# Patient Record
Sex: Female | Born: 1970 | Race: White | Hispanic: No | Marital: Married | Smoking: Never smoker
Health system: Southern US, Community
[De-identification: ages and names within clinical notes are randomized; demographics above are authoritative.]

## PROBLEM LIST (undated history)

## (undated) DIAGNOSIS — E785 Hyperlipidemia, unspecified: Secondary | ICD-10-CM

## (undated) DIAGNOSIS — F259 Schizoaffective disorder, unspecified: Secondary | ICD-10-CM

## (undated) DIAGNOSIS — F329 Major depressive disorder, single episode, unspecified: Secondary | ICD-10-CM

## (undated) DIAGNOSIS — G4730 Sleep apnea, unspecified: Secondary | ICD-10-CM

## (undated) DIAGNOSIS — F32A Depression, unspecified: Secondary | ICD-10-CM

## (undated) DIAGNOSIS — I10 Essential (primary) hypertension: Secondary | ICD-10-CM

## (undated) DIAGNOSIS — F22 Delusional disorders: Secondary | ICD-10-CM

## (undated) DIAGNOSIS — F429 Obsessive-compulsive disorder, unspecified: Secondary | ICD-10-CM

## (undated) DIAGNOSIS — F319 Bipolar disorder, unspecified: Secondary | ICD-10-CM

## (undated) DIAGNOSIS — F419 Anxiety disorder, unspecified: Secondary | ICD-10-CM

## (undated) DIAGNOSIS — E119 Type 2 diabetes mellitus without complications: Secondary | ICD-10-CM

## (undated) DIAGNOSIS — J309 Allergic rhinitis, unspecified: Secondary | ICD-10-CM

## (undated) DIAGNOSIS — K219 Gastro-esophageal reflux disease without esophagitis: Secondary | ICD-10-CM

## (undated) DIAGNOSIS — M1990 Unspecified osteoarthritis, unspecified site: Secondary | ICD-10-CM

## (undated) DIAGNOSIS — R002 Palpitations: Secondary | ICD-10-CM

## (undated) DIAGNOSIS — J45909 Unspecified asthma, uncomplicated: Secondary | ICD-10-CM

## (undated) HISTORY — DX: Anxiety disorder, unspecified: F41.9

## (undated) HISTORY — DX: Essential (primary) hypertension: I10

## (undated) HISTORY — DX: Sleep apnea, unspecified: G47.30

## (undated) HISTORY — DX: Gastro-esophageal reflux disease without esophagitis: K21.9

## (undated) HISTORY — DX: Obsessive-compulsive disorder, unspecified: F42.9

## (undated) HISTORY — DX: Unspecified asthma, uncomplicated: J45.909

## (undated) HISTORY — DX: Palpitations: R00.2

## (undated) HISTORY — DX: Delusional disorders: F22

## (undated) HISTORY — PX: TUBAL LIGATION: SHX77

## (undated) HISTORY — DX: Hyperlipidemia, unspecified: E78.5

## (undated) HISTORY — DX: Depression, unspecified: F32.A

## (undated) HISTORY — DX: Schizoaffective disorder, unspecified: F25.9

## (undated) HISTORY — DX: Type 2 diabetes mellitus without complications: E11.9

## (undated) HISTORY — DX: Allergic rhinitis, unspecified: J30.9

## (undated) HISTORY — DX: Major depressive disorder, single episode, unspecified: F32.9

## (undated) HISTORY — DX: Bipolar disorder, unspecified: F31.9

## (undated) HISTORY — DX: Unspecified osteoarthritis, unspecified site: M19.90

---

## 2004-03-08 ENCOUNTER — Emergency Department (HOSPITAL_COMMUNITY): Admission: EM | Admit: 2004-03-08 | Discharge: 2004-03-08 | Payer: Self-pay | Admitting: Emergency Medicine

## 2004-03-21 ENCOUNTER — Emergency Department: Payer: Self-pay | Admitting: Emergency Medicine

## 2004-04-27 HISTORY — PX: CHOLECYSTECTOMY: SHX55

## 2004-09-01 ENCOUNTER — Ambulatory Visit: Payer: Self-pay | Admitting: Obstetrics and Gynecology

## 2005-04-09 ENCOUNTER — Inpatient Hospital Stay: Payer: Self-pay | Admitting: Obstetrics and Gynecology

## 2006-10-07 ENCOUNTER — Encounter: Payer: Self-pay | Admitting: Maternal & Fetal Medicine

## 2006-10-21 ENCOUNTER — Encounter: Payer: Self-pay | Admitting: Maternal & Fetal Medicine

## 2007-02-08 ENCOUNTER — Observation Stay: Payer: Self-pay | Admitting: Obstetrics and Gynecology

## 2007-02-24 ENCOUNTER — Ambulatory Visit: Payer: Self-pay | Admitting: Obstetrics and Gynecology

## 2007-02-25 ENCOUNTER — Inpatient Hospital Stay: Payer: Self-pay | Admitting: Obstetrics and Gynecology

## 2007-03-16 ENCOUNTER — Other Ambulatory Visit: Payer: Self-pay

## 2007-03-16 ENCOUNTER — Emergency Department: Payer: Self-pay | Admitting: Emergency Medicine

## 2009-07-22 ENCOUNTER — Ambulatory Visit: Payer: Self-pay | Admitting: Family Medicine

## 2009-08-01 ENCOUNTER — Ambulatory Visit: Payer: Self-pay | Admitting: Family Medicine

## 2009-08-12 ENCOUNTER — Ambulatory Visit: Payer: Self-pay | Admitting: Family Medicine

## 2009-08-15 ENCOUNTER — Ambulatory Visit: Payer: Self-pay | Admitting: Family Medicine

## 2009-08-26 ENCOUNTER — Ambulatory Visit: Payer: Self-pay | Admitting: Family Medicine

## 2009-08-29 ENCOUNTER — Ambulatory Visit: Payer: Self-pay | Admitting: Family Medicine

## 2009-09-25 ENCOUNTER — Ambulatory Visit: Payer: Self-pay | Admitting: Family Medicine

## 2010-05-01 DIAGNOSIS — G4730 Sleep apnea, unspecified: Secondary | ICD-10-CM | POA: Insufficient documentation

## 2010-07-28 ENCOUNTER — Ambulatory Visit: Payer: Self-pay | Admitting: Family Medicine

## 2011-01-16 ENCOUNTER — Ambulatory Visit: Payer: Self-pay | Admitting: Gastroenterology

## 2011-04-30 ENCOUNTER — Ambulatory Visit: Payer: Self-pay | Admitting: Family Medicine

## 2011-11-23 IMAGING — US ABDOMEN ULTRASOUND
1 series · 17 of 25 positions shown · non-contrast
Comparison: none

REASON FOR EXAM: ELEVATED LIVER ENZYMES
COMMENTS:

[Series 1: abdomen ultrasound · 17 of 59 slices shown]
[im 1/59]
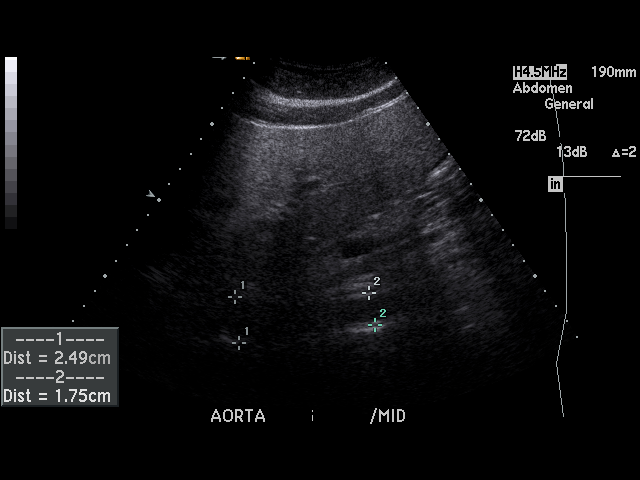
[im 5/59]
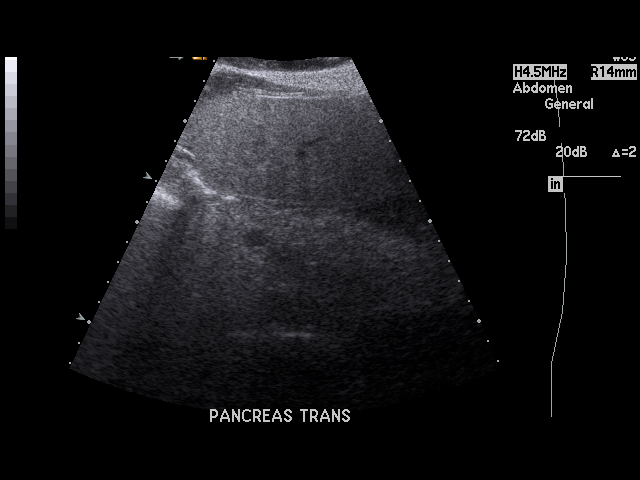
[im 8/59]
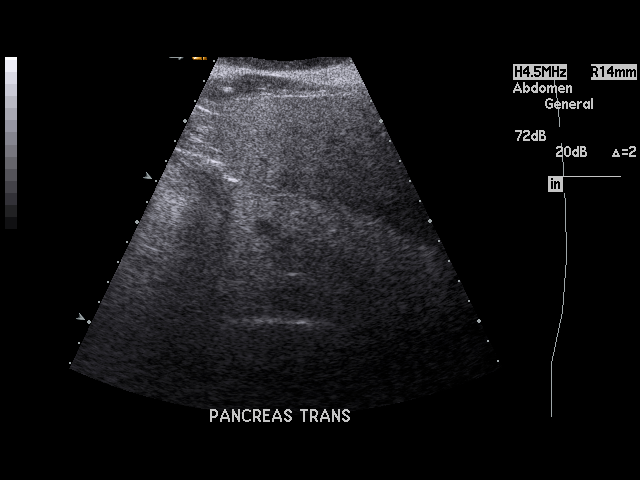
[im 13/59]
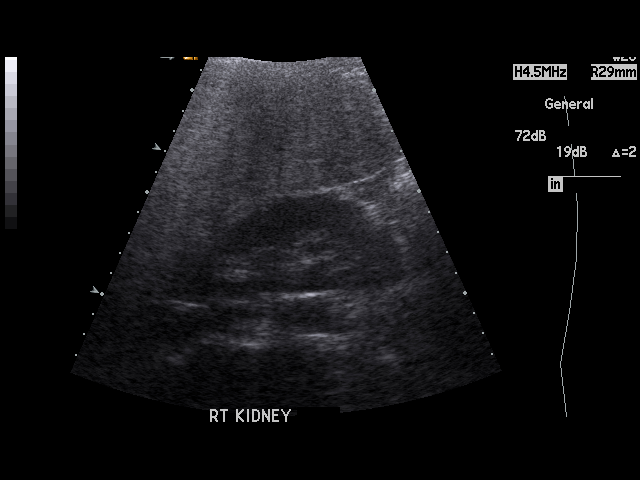
[im 15/59]
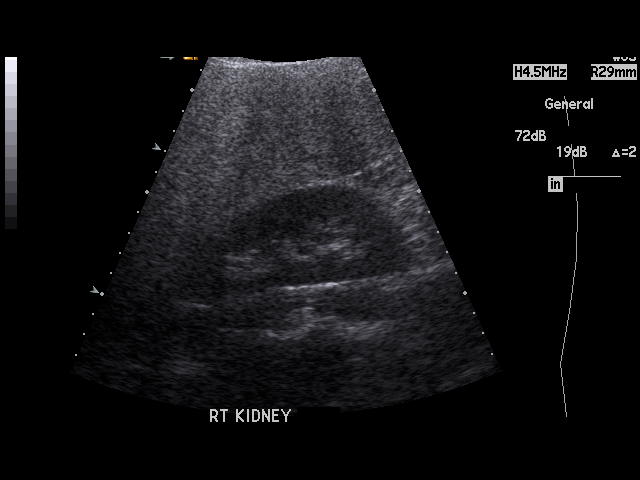
[im 20/59]
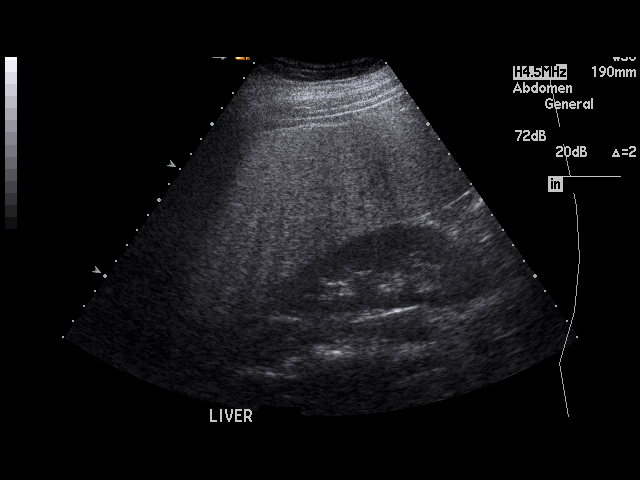
[im 22/59]
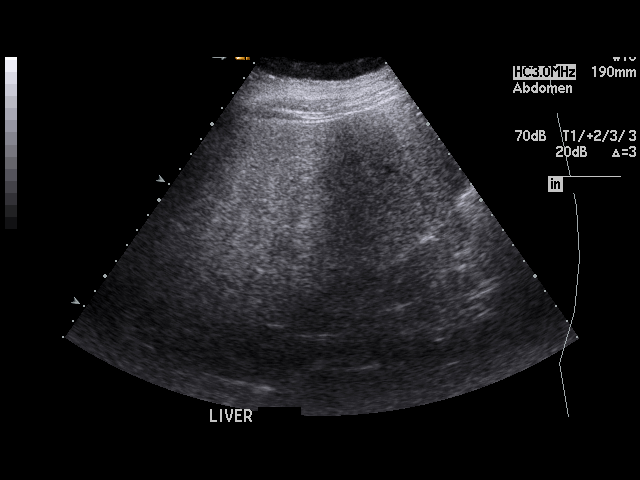
[im 27/59]
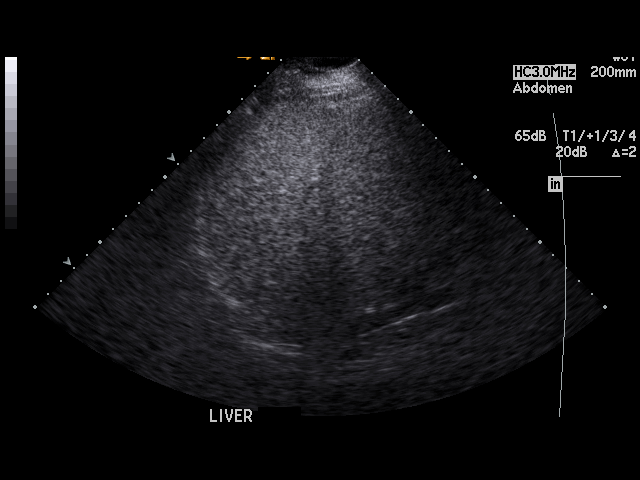
[im 30/59]
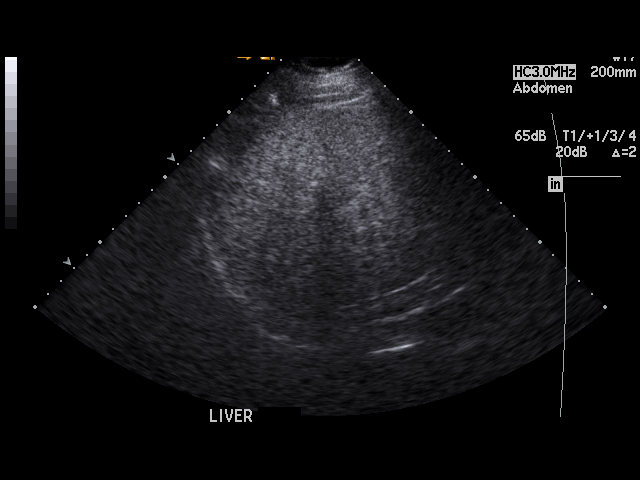
[im 32/59]
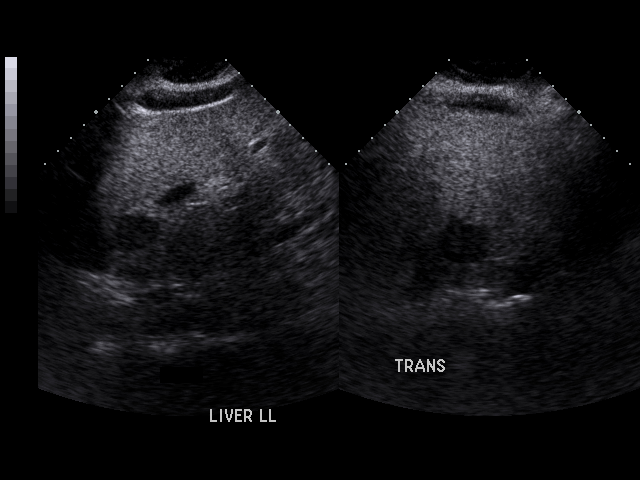
[im 37/59]
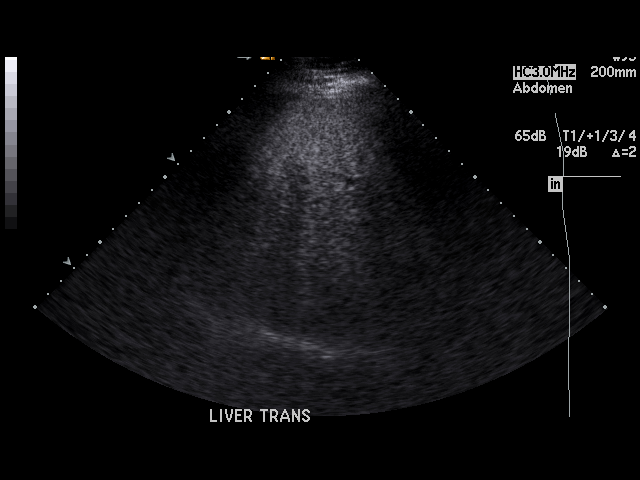
[im 39/59]
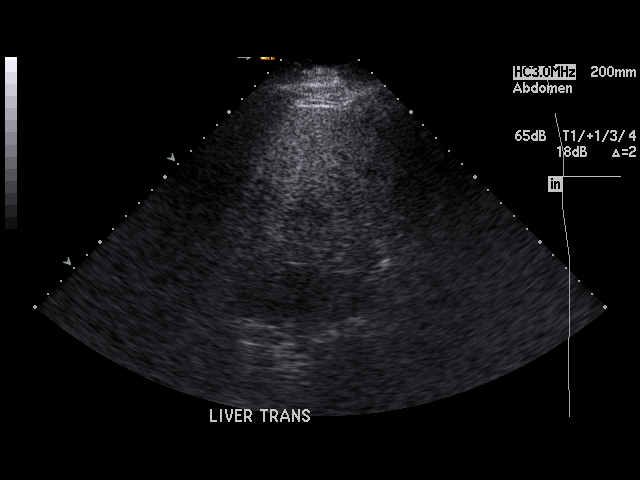
[im 44/59]
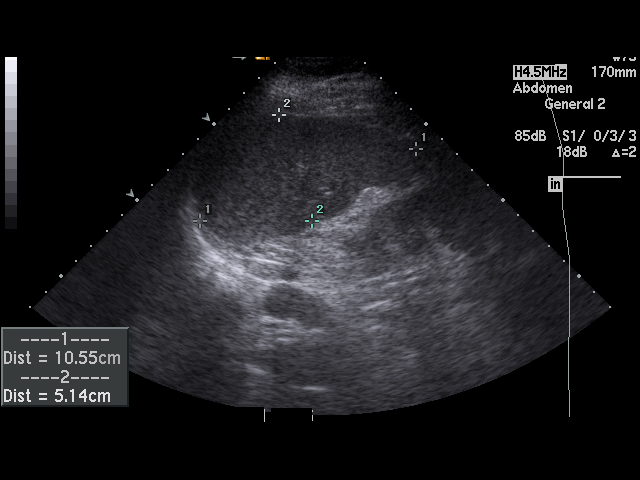
[im 46/59]
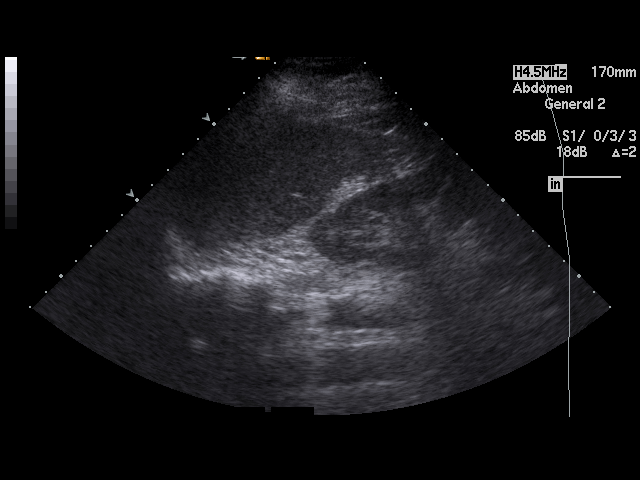
[im 51/59]
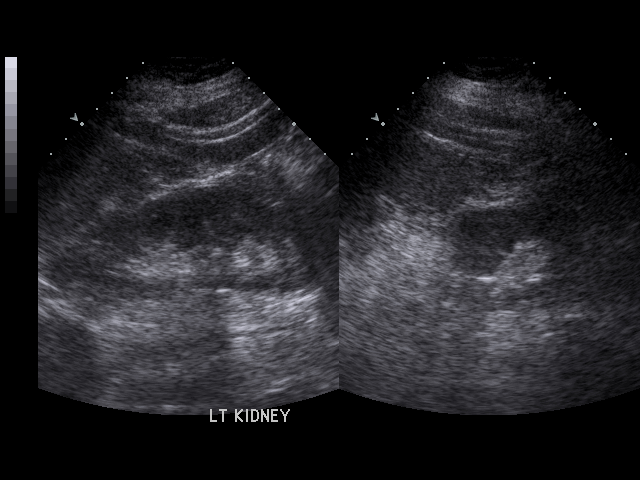
[im 54/59]
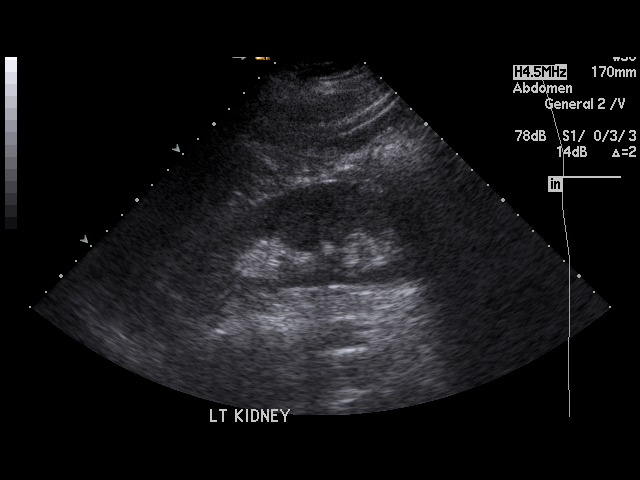
[im 59/59]
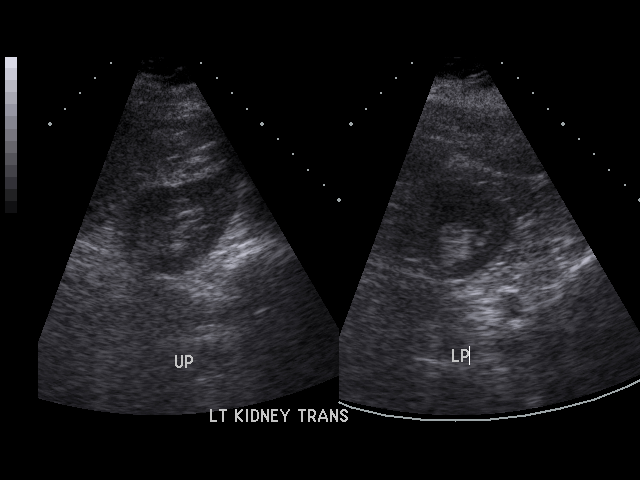

[17 of 25 positions shown; findings below may reference images not displayed]

PROCEDURE:     US  - US ABDOMEN GENERAL SURVEY  - August 01, 2009  [DATE]

RESULT:     The hepatic echo pattern is dense, suspicious for diffuse
hepatic parenchymal disease such as fatty infiltration. There is a 2.89 cm
hypoechoic mass posteriorly in the left lobe of the liver. This could
represent a cyst containing debris but the possibility of a solid hypoechoic
mass cannot be excluded sonographically and further evaluation by CT is
recommended. The head and tail of the pancreas are not optimally visualized.
The body of the pancreas is normal in appearance. The abdominal aorta and
inferior vena cava show no significant abnormalities. Spleen size is normal.
The gallbladder is not seen compatible with prior cholecystectomy. The
common bile duct measures 4.4 mm in diameter which is within normal limits.
The kidneys show no hydronephrosis. There is no ascites.
IMPRESSION: 1. The hepatic echo pattern is hyperechogenic, suspicious for fatty
infiltration.
2. There is a focal hypoechoic mass in the left lobe of the liver.
Sonographically, the mass is indeterminate and further evaluation by CT is
suggested.
3. The patient is status post cholecystectomy.
4. No ascites is seen.

## 2012-01-01 ENCOUNTER — Ambulatory Visit: Payer: Self-pay | Admitting: Family Medicine

## 2012-12-05 ENCOUNTER — Other Ambulatory Visit: Payer: Self-pay | Admitting: Internal Medicine

## 2012-12-05 DIAGNOSIS — R1110 Vomiting, unspecified: Secondary | ICD-10-CM

## 2012-12-13 ENCOUNTER — Ambulatory Visit
Admission: RE | Admit: 2012-12-13 | Discharge: 2012-12-13 | Disposition: A | Discharge status: Home / Self Care | Payer: Medicare Other | Source: Ambulatory Visit | Attending: Internal Medicine | Admitting: Internal Medicine

## 2012-12-13 DIAGNOSIS — R1110 Vomiting, unspecified: Secondary | ICD-10-CM

## 2013-03-20 ENCOUNTER — Ambulatory Visit: Payer: Self-pay | Admitting: Gastroenterology

## 2014-03-27 LAB — HM DIABETES EYE EXAM

## 2014-05-10 DIAGNOSIS — L718 Other rosacea: Secondary | ICD-10-CM | POA: Diagnosis not present

## 2014-06-15 DIAGNOSIS — J4520 Mild intermittent asthma, uncomplicated: Secondary | ICD-10-CM | POA: Diagnosis not present

## 2014-06-15 DIAGNOSIS — E782 Mixed hyperlipidemia: Secondary | ICD-10-CM | POA: Diagnosis not present

## 2014-06-15 DIAGNOSIS — K219 Gastro-esophageal reflux disease without esophagitis: Secondary | ICD-10-CM | POA: Diagnosis not present

## 2014-06-15 DIAGNOSIS — G4730 Sleep apnea, unspecified: Secondary | ICD-10-CM | POA: Diagnosis not present

## 2014-06-15 DIAGNOSIS — E119 Type 2 diabetes mellitus without complications: Secondary | ICD-10-CM | POA: Diagnosis not present

## 2014-08-03 DIAGNOSIS — E119 Type 2 diabetes mellitus without complications: Secondary | ICD-10-CM | POA: Diagnosis not present

## 2014-08-03 DIAGNOSIS — K219 Gastro-esophageal reflux disease without esophagitis: Secondary | ICD-10-CM | POA: Diagnosis not present

## 2014-08-03 DIAGNOSIS — G4730 Sleep apnea, unspecified: Secondary | ICD-10-CM | POA: Diagnosis not present

## 2014-08-03 DIAGNOSIS — Z1389 Encounter for screening for other disorder: Secondary | ICD-10-CM | POA: Diagnosis not present

## 2014-08-03 DIAGNOSIS — J4520 Mild intermittent asthma, uncomplicated: Secondary | ICD-10-CM | POA: Diagnosis not present

## 2014-10-18 DIAGNOSIS — L718 Other rosacea: Secondary | ICD-10-CM | POA: Diagnosis not present

## 2014-10-30 DIAGNOSIS — G4733 Obstructive sleep apnea (adult) (pediatric): Secondary | ICD-10-CM | POA: Diagnosis not present

## 2014-12-03 ENCOUNTER — Ambulatory Visit (INDEPENDENT_AMBULATORY_CARE_PROVIDER_SITE_OTHER): Payer: Medicare Other | Admitting: Family Medicine

## 2014-12-03 ENCOUNTER — Encounter: Payer: Self-pay | Admitting: Family Medicine

## 2014-12-03 VITALS — BP 110/74 | HR 96 | Temp 98.2°F | Resp 16 | Ht 62.5 in | Wt 223.8 lb

## 2014-12-03 DIAGNOSIS — E1140 Type 2 diabetes mellitus with diabetic neuropathy, unspecified: Secondary | ICD-10-CM | POA: Insufficient documentation

## 2014-12-03 DIAGNOSIS — Z794 Long term (current) use of insulin: Secondary | ICD-10-CM

## 2014-12-03 DIAGNOSIS — K219 Gastro-esophageal reflux disease without esophagitis: Secondary | ICD-10-CM | POA: Diagnosis not present

## 2014-12-03 DIAGNOSIS — F3175 Bipolar disorder, in partial remission, most recent episode depressed: Secondary | ICD-10-CM | POA: Diagnosis not present

## 2014-12-03 DIAGNOSIS — E119 Type 2 diabetes mellitus without complications: Secondary | ICD-10-CM | POA: Insufficient documentation

## 2014-12-03 DIAGNOSIS — F3170 Bipolar disorder, currently in remission, most recent episode unspecified: Secondary | ICD-10-CM | POA: Insufficient documentation

## 2014-12-03 DIAGNOSIS — E785 Hyperlipidemia, unspecified: Secondary | ICD-10-CM | POA: Insufficient documentation

## 2014-12-03 DIAGNOSIS — E1165 Type 2 diabetes mellitus with hyperglycemia: Secondary | ICD-10-CM | POA: Insufficient documentation

## 2014-12-03 DIAGNOSIS — I10 Essential (primary) hypertension: Secondary | ICD-10-CM

## 2014-12-03 DIAGNOSIS — E1159 Type 2 diabetes mellitus with other circulatory complications: Secondary | ICD-10-CM | POA: Insufficient documentation

## 2014-12-03 LAB — POCT GLYCOSYLATED HEMOGLOBIN (HGB A1C): Hemoglobin A1C: 7.7

## 2014-12-03 MED ORDER — INSULIN DETEMIR 100 UNIT/ML FLEXPEN: 28.0000 [IU] | PEN_INJECTOR | Freq: Two times a day (BID) | SUBCUTANEOUS | Status: DC

## 2014-12-03 MED ORDER — PIOGLITAZONE HCL 45 MG PO TABS: 45.0000 mg | ORAL_TABLET | Freq: Every day | ORAL | Status: DC

## 2014-12-03 NOTE — Patient Instructions (Addendum)
Except for change in Actos dose and Levemir dose, cont. All other current meds.  Discussed weight loss.

## 2014-12-03 NOTE — Progress Notes (Signed)
Name: Tabitha [name]   MRN: [id number]    DOB: 1971/03/21   Date:12/03/2014       Progress Note  Subjective  Chief Complaint  Chief Complaint  Patient presents with  . Diabetes    BS highest 237 lowest 190 and avarage 200    HPI  Here for f/u of DM.  Also with HBP, elevated lipids, bipolar disorder, hyperlipidemia..  Feeling ok.  Bipolar disorder in partial remission.  Weight still increaseing  No problem-specific assessment & plan notes found for this encounter.   History reviewed. No pertinent past medical history.  History  Substance Use Topics  . Smoking status: Never Smoker   . Smokeless tobacco: Not on file  . Alcohol Use: No     Current outpatient prescriptions:  .  ACCU-CHEK AVIVA PLUS test strip, , Disp: , Rfl: 1 .  atorvastatin (LIPITOR) 20 MG tablet, , Disp: , Rfl: 1 .  doxycycline (VIBRAMYCIN) 50 MG capsule, , Disp: , Rfl: 0 .  LEVEMIR FLEXTOUCH 100 UNIT/ML Pen, Inject 30 Units into the skin 2 (two) times daily., Disp: , Rfl: 0 .  lisinopril (PRINIVIL,ZESTRIL) 5 MG tablet, , Disp: , Rfl: 1 .  metformin (FORTAMET) 1000 MG (OSM) 24 hr tablet, , Disp: , Rfl: 1 .  OLANZapine (ZYPREXA) 15 MG tablet, , Disp: , Rfl: 0 .  omeprazole (PRILOSEC) 10 MG capsule, , Disp: , Rfl: 1 .  VIIBRYD 40 MG TABS, Take 40 mg by mouth every morning., Disp: , Rfl: 0  No Known Allergies  Review of Systems  Constitutional: Negative for fever, chills, weight loss and malaise/fatigue.  HENT: Negative for hearing loss.   Eyes: Negative for blurred vision and double vision.  Respiratory: Negative for cough, sputum production, shortness of breath and wheezing.   Cardiovascular: Negative for chest pain, palpitations and leg swelling.  Gastrointestinal: Negative for heartburn, nausea, vomiting, abdominal pain, diarrhea and blood in stool.  Genitourinary: Negative for dysuria, urgency and frequency.  Musculoskeletal: Negative for myalgias and joint pain.  Skin: Negative for rash.   Neurological: Negative for dizziness, tingling, sensory change, focal weakness, weakness and headaches.  Psychiatric/Behavioral: Positive for depression. Negative for suicidal ideas and substance abuse. The patient is not nervous/anxious.       Objective  Filed Vitals:   12/03/14 1356  BP: 110/74  Pulse: 96  Temp: 98.2 F (36.8 C)  TempSrc: Oral  Resp: 16  Height: 5' 2.5" (1.588 m)  Weight: 223 lb 12.8 oz (101.515 kg)     Physical Exam  Constitutional: She is well-developed, well-nourished, and in no distress. No distress.  HENT:  Head: Normocephalic and atraumatic.  Eyes: Conjunctivae and EOM are normal. Pupils are equal, round, and reactive to light. No scleral icterus.  Neck: Normal range of motion. Neck supple. No thyromegaly present.  Cardiovascular: Normal rate, regular rhythm, normal heart sounds and intact distal pulses.  Exam reveals no gallop and no friction rub.   No murmur heard. Pulmonary/Chest: Effort normal and breath sounds normal. No respiratory distress. She has no wheezes. She has no rales.  Abdominal: Soft. Bowel sounds are normal. She exhibits no distension and no mass. There is no tenderness.  Musculoskeletal: Normal range of motion. She exhibits edema (trace edema bilaterally).  Lymphadenopathy:    She has no cervical adenopathy.  Skin: Skin is warm and dry.  Psychiatric: Mood, memory, affect and judgment normal.  Vitals reviewed.     Recent Results (from the past 2160 hour(s))  POCT HgB  A1C     Status: Abnormal   Collection Time: 12/03/14  2:05 PM  Result Value Ref Range   Hemoglobin A1C 7.7      Assessment & Plan  1. Type 2 diabetes mellitus without complication  - POCT HgB A1C -7.7 - pioglitazone (ACTOS) 45 MG tablet; Take 1 tablet (45 mg total) by mouth daily.  Dispense: 30 tablet; Refill: 6 - Insulin Detemir (LEVEMIR) 100 UNIT/ML Pen; Inject 28 Units into the skin 2 (two) times daily.  Dispense: 15 mL; Refill: 11  2. Essential  hypertension   3. Hyperlipidemia   4. Bipolar disorder, in partial remission, most recent episode depressed   5. GERD without esophagitis

## 2014-12-19 ENCOUNTER — Ambulatory Visit (INDEPENDENT_AMBULATORY_CARE_PROVIDER_SITE_OTHER): Payer: Medicare Other | Admitting: Family Medicine

## 2014-12-19 ENCOUNTER — Encounter: Payer: Self-pay | Admitting: Family Medicine

## 2014-12-19 VITALS — BP 112/72 | HR 90 | Temp 98.0°F | Resp 16 | Ht 62.5 in | Wt 226.0 lb

## 2014-12-19 DIAGNOSIS — E08321 Diabetes mellitus due to underlying condition with mild nonproliferative diabetic retinopathy with macular edema: Secondary | ICD-10-CM

## 2014-12-19 DIAGNOSIS — E083219 Diabetes mellitus due to underlying condition with mild nonproliferative diabetic retinopathy with macular edema, unspecified eye: Secondary | ICD-10-CM

## 2014-12-19 DIAGNOSIS — I10 Essential (primary) hypertension: Secondary | ICD-10-CM | POA: Diagnosis not present

## 2014-12-19 DIAGNOSIS — Z0000 Encounter for general adult medical examination without abnormal findings: Secondary | ICD-10-CM | POA: Diagnosis not present

## 2014-12-19 NOTE — Progress Notes (Signed)
Name: Tabitha [name]   MRN: [id number]    DOB: December 06, 1970   Date:12/19/2014       Progress Note  Subjective  Chief Complaint  Chief Complaint  Patient presents with  . Annual Exam    HPI  Here for yearly physical exam.  Has a GYN for PAP smears and breast exams.  Reports that she had an abnormal PAP about 6 months ago, and has to go back here for f/u soon.  Her BSs have risen lately (250-300)  because of soft drinks and not following diet.  Feels well overall.   Past Medical History  Diagnosis Date  . Diabetes mellitus without complication   . Mild sleep apnea   . Asthma   . GERD (gastroesophageal reflux disease)   . Depression     Past Surgical History  Procedure Laterality Date  . Cholecystectomy  2006  . Cesarean section  2006/2008    Family History  Problem Relation Age of Onset  . Diabetes Father   . Hyperlipidemia Father   . Diabetes Brother     Social History   Social History  . Marital Status: Married    Spouse Name: N/A  . Number of Children: N/A  . Years of Education: N/A   Occupational History  . Not on file.   Social History Main Topics  . Smoking status: Never Smoker   . Smokeless tobacco: Never Used  . Alcohol Use: No  . Drug Use: No  . Sexual Activity: Not on file   Other Topics Concern  . Not on file   Social History Narrative     Current outpatient prescriptions:  .  ACCU-CHEK AVIVA PLUS test strip, , Disp: , Rfl: 1 .  atorvastatin (LIPITOR) 20 MG tablet, , Disp: , Rfl: 1 .  doxycycline (VIBRAMYCIN) 50 MG capsule, , Disp: , Rfl: 0 .  Insulin Detemir (LEVEMIR) 100 UNIT/ML Pen, Inject 28 Units into the skin 2 (two) times daily., Disp: 15 mL, Rfl: 11 .  lisinopril (PRINIVIL,ZESTRIL) 5 MG tablet, , Disp: , Rfl: 1 .  metformin (FORTAMET) 1000 MG (OSM) 24 hr tablet, Take 1,000 mg by mouth 2 (two) times daily with a meal. , Disp: , Rfl: 1 .  OLANZapine (ZYPREXA) 15 MG tablet, , Disp: , Rfl: 0 .  omeprazole (PRILOSEC) 10 MG capsule, ,  Disp: , Rfl: 1 .  pioglitazone (ACTOS) 45 MG tablet, Take 1 tablet (45 mg total) by mouth daily., Disp: 30 tablet, Rfl: 6 .  VIIBRYD 40 MG TABS, Take 40 mg by mouth every morning., Disp: , Rfl: 0  No Known Allergies   Review of Systems  Constitutional: Negative for fever, chills, weight loss and malaise/fatigue.  HENT: Negative for congestion, ear pain and hearing loss.   Eyes: Negative for blurred vision, double vision and pain.  Respiratory: Negative for cough, sputum production, shortness of breath and wheezing.   Cardiovascular: Negative for chest pain, palpitations, orthopnea and leg swelling.  Gastrointestinal: Negative for heartburn, nausea, vomiting, abdominal pain, diarrhea and blood in stool.  Genitourinary: Negative for dysuria, urgency and frequency.  Musculoskeletal: Negative for myalgias and joint pain.  Skin: Negative for rash.  Neurological: Negative for dizziness, sensory change, focal weakness, weakness and headaches.  Psychiatric/Behavioral: Negative for depression. The patient is not nervous/anxious.       Objective  Filed Vitals:   12/19/14 1416  BP: 112/72  Pulse: 90  Temp: 98 F (36.7 C)  TempSrc: Oral  Resp: 16  Height:  5' 2.5" (1.588 m)  Weight: 226 lb (102.513 kg)    Physical Exam  Constitutional: She is oriented to person, place, and time. She appears distressed.  HENT:  Head: Normocephalic and atraumatic.  Right Ear: External ear normal.  Left Ear: External ear normal.  Nose: Nose normal.  Mouth/Throat: Oropharynx is clear and moist.  Eyes: Conjunctivae and EOM are normal. Pupils are equal, round, and reactive to light. No scleral icterus.  Neck: Normal range of motion. Neck supple. Carotid bruit is not present. No thyromegaly present.  Cardiovascular: Normal rate, regular rhythm, normal heart sounds and intact distal pulses.  Exam reveals no gallop and no friction rub.   No murmur heard. Pulmonary/Chest: Effort normal and breath sounds  normal. No respiratory distress. She has no wheezes. She has no rales.  Breast exam deferred to her Gynacologist.  Abdominal: Soft. Bowel sounds are normal. She exhibits no distension and no mass. There is no tenderness.  Genitourinary:  Deferred to her Gynacologist  Musculoskeletal: Normal range of motion. She exhibits edema (trace edema bilateral lower extremities). She exhibits no tenderness.  Lymphadenopathy:    She has no cervical adenopathy.  Neurological: She is alert and oriented to person, place, and time. No cranial nerve deficit. Gait normal. Coordination normal.  Skin: Skin is warm and dry. No rash noted. No erythema. No pallor.  Psychiatric: Mood, memory, affect and judgment normal.  Vitals reviewed.      Recent Results (from the past 2160 hour(s))  POCT HgB A1C     Status: Abnormal   Collection Time: 12/03/14  2:05 PM  Result Value Ref Range   Hemoglobin A1C 7.7      Assessment & Plan  Problem List Items Addressed This Visit    None     1. Annual physical exam   2. 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 Essential hypertension

## 2014-12-19 NOTE — Patient Instructions (Addendum)
Discussed diet and reducing sugars.  Continue current meds.

## 2015-01-02 ENCOUNTER — Telehealth: Payer: Self-pay

## 2015-01-02 NOTE — Telephone Encounter (Signed)
Pt wants Rx for Vibryd 40 mg originally Rx by [facility] who doesn't want to Rx this anymore and she switch pharmacy and new pharmacy want her to get refill from physician so she wants to find out that if Dr. Juanetta Gosling can Rx this one ?

## 2015-01-02 NOTE — Telephone Encounter (Signed)
No.  That type of medication with her problem needs to be prescribed by a Psychiatrist.  I cannot fill it for her.-jh

## 2015-01-02 NOTE — Telephone Encounter (Signed)
Pt advised.

## 2015-01-03 DIAGNOSIS — Z124 Encounter for screening for malignant neoplasm of cervix: Secondary | ICD-10-CM | POA: Diagnosis not present

## 2015-01-03 DIAGNOSIS — Z01419 Encounter for gynecological examination (general) (routine) without abnormal findings: Secondary | ICD-10-CM | POA: Diagnosis not present

## 2015-01-03 DIAGNOSIS — E282 Polycystic ovarian syndrome: Secondary | ICD-10-CM | POA: Diagnosis not present

## 2015-01-08 ENCOUNTER — Encounter: Payer: Self-pay | Admitting: Psychiatry

## 2015-01-08 ENCOUNTER — Ambulatory Visit (INDEPENDENT_AMBULATORY_CARE_PROVIDER_SITE_OTHER): Payer: Medicare Other | Admitting: Psychiatry

## 2015-01-08 VITALS — BP 122/76 | HR 102 | Temp 98.3°F | Ht 63.0 in | Wt 224.6 lb

## 2015-01-08 DIAGNOSIS — F3130 Bipolar disorder, current episode depressed, mild or moderate severity, unspecified: Secondary | ICD-10-CM

## 2015-01-08 DIAGNOSIS — F42 Obsessive-compulsive disorder: Secondary | ICD-10-CM | POA: Diagnosis not present

## 2015-01-08 DIAGNOSIS — F429 Obsessive-compulsive disorder, unspecified: Secondary | ICD-10-CM

## 2015-01-08 MED ORDER — OLANZAPINE 10 MG PO TABS: 10.0000 mg | ORAL_TABLET | Freq: Every day | ORAL | Status: DC

## 2015-01-08 MED ORDER — VIIBRYD 40 MG PO TABS: 40.0000 mg | ORAL_TABLET | Freq: Every morning | ORAL | Status: DC

## 2015-01-08 MED ORDER — ZIPRASIDONE HCL 20 MG PO CAPS: 20.0000 mg | ORAL_CAPSULE | Freq: Two times a day (BID) | ORAL | Status: DC

## 2015-01-08 NOTE — Progress Notes (Signed)
Psychiatric Initial Adult Assessment   Patient Identification: Tabitha [name] MRN:  [id number] Date of Evaluation:  01/08/2015 Referral Source: [facility].  Chief Complaint:   Chief Complaint    Establish Care; Anxiety; Fatigue     Visit Diagnosis:    ICD-9-CM ICD-10-CM   1. Bipolar I disorder, most recent episode depressed 296.50 F31.30    Diagnosis:   Patient Active Problem List   Diagnosis Date Noted  . DM (diabetes mellitus) [E11.9] 12/03/2014  . HBP (high blood pressure) [I10] 12/03/2014  . Hyperlipidemia [E78.5] 12/03/2014  . Bipolar disorder in partial remission [F31.70] 12/03/2014  . GERD without esophagitis [K21.9] 12/03/2014   History of Present Illness:    Patient is a 44 year old married female who presented for initial assessment. She was referred by [facility] as she was following Dr. Tomasa Rand who suddenly left his practice. Patient reported that she was diagnosed with bipolar disorder and OCD. She reported that she has started taking olanzapine for the past 6 months and has history of paranoia and feels that people at church have been talking about her and they are trying to hurt her. Patient reported that she is a good [place] and she also occasionally feels that Shaune Pollack has left her and she feels anxious about it.  Patient reported that she also feels that some of her neighbors have been talking about her and she talks to her husband who has been trying to calm her and she is taking her medications on a consistent basis. Patient reported that she does not know how the medications have been helping her. Patient reported that she does not have any thoughts to hurt herself. Occasionally she will hear voices about the people at the church who will be talking negative about her and will tell her that she is a bad person. Patient currently denied having any command auditory hallucinations. She denied having any paranoia, denied  having any suicidal ideations or plans. She does not have any previous history of suicide attempts. She sleeps well at night with the help of olanzapine. Patient is interested in having her medications adjusted at this time. Elements:  Location:  Paranoia, depression. Associated Signs/Symptoms:  Depression Symptoms:  depressed mood, psychomotor retardation, fatigue, feelings of worthlessness/guilt, difficulty concentrating, hopelessness, anxiety, loss of energy/fatigue, disturbed sleep, weight gain, increased appetite, (Hypo) Manic Symptoms:  none Anxiety Symptoms:  Obsessive Compulsive Symptoms:   Checking, Handwashing,, Psychotic Symptoms:  Delusions, Ideas of Reference, Paranoia, PTSD Symptoms: Negative NA  Past Medical History:  Past Medical History  Diagnosis Date  . Diabetes mellitus without complication   . Mild sleep apnea   . Asthma   . GERD (gastroesophageal reflux disease)   . Depression   . Diabetes mellitus, type II     Past Surgical History  Procedure Laterality Date  . Cholecystectomy  2006  . Cesarean section  2006/2008   Family History:  Family History  Problem Relation Age of Onset  . Diabetes Father   . Hyperlipidemia Father   . Diabetes Brother   . Lupus Mother    Social History:   Social History   Social History  . Marital Status: Married    Spouse Name: N/A  . Number of Children: N/A  . Years of Education: N/A   Social History Main Topics  . Smoking status: Never Smoker   . Smokeless tobacco: Never Used  . Alcohol Use: No  . Drug Use: No  . Sexual Activity: Yes    Birth  Control/ Protection: None   Other Topics Concern  . None   Social History Narrative   Additional Social History:  Married x 11 years. Has a good relationship with husband and has a 20 and 38 yo. 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 She stated that she does not smoke cigarettes and does not use any drugs or  alcohol   Musculoskeletal: Strength & Muscle Tone: within normal limits Gait & Station: normal Patient leans: N/A  Psychiatric Specialty Exam: HPI  Review of Systems  Constitutional: Positive for malaise/fatigue. Negative for weight loss.  HENT: Negative for congestion and hearing loss.   Eyes: Negative for photophobia.  Respiratory: Negative for cough and sputum production.   Genitourinary: Negative for urgency.  Psychiatric/Behavioral: Positive for depression and hallucinations. The patient is nervous/anxious and has insomnia.   All other systems reviewed and are negative.   Blood pressure 122/76, pulse 102, temperature 98.3 F (36.8 C), temperature source Tympanic, height  (1.6 m), weight 224 lb 9.6 oz (101.878 kg), last menstrual period 01/08/2015, SpO2 95 %.Body mass index is 39.8 kg/(m^2).  General Appearance: Casual and Fairly Groomed  Eye Contact:  Fair  Speech:  Clear and Coherent and Slow  Volume:  Decreased  Mood:  Anxious and Depressed  Affect:  Blunt and Congruent  Thought Process:  Coherent  Orientation:  Full (Time, Place, and Person)  Thought Content:  WDL  Suicidal Thoughts:  No  Homicidal Thoughts:  No  Memory:  Immediate;   Fair  Judgement:  Fair  Insight:  Fair  Psychomotor Activity:  Decreased  Concentration:  Fair  Recall:  [employer] of Knowledge:Fair  Language: Fair  Akathisia:  No  Handed:  Right  AIMS (if indicated):  none  Assets:  Communication Skills Desire for Improvement Housing Intimacy Physical Health Social Support  ADL's:  Intact  Cognition: WNL  Sleep:  6-7    Is the patient at risk to self?  No. Has the patient been a risk to self in the past 6 months?  No. Has the patient been a risk to self within the distant past?  No. Is the patient a risk to others?  No. Has the patient been a risk to others in the past 6 months?  No. Has the patient been a risk to others within the distant past?  No.  Allergies:  No Known  Allergies Current Medications: Current Outpatient Prescriptions  Medication Sig Dispense Refill  . ACCU-CHEK AVIVA PLUS test strip   1  . atorvastatin (LIPITOR) 20 MG tablet   1  . doxycycline (VIBRAMYCIN) 50 MG capsule   0  . Insulin Detemir (LEVEMIR) 100 UNIT/ML Pen Inject 28 Units into the skin 2 (two) times daily. 15 mL 11  . lisinopril (PRINIVIL,ZESTRIL) 5 MG tablet   1  . metformin (FORTAMET) 1000 MG (OSM) 24 hr tablet Take 1,000 mg by mouth 2 (two) times daily with a meal.   1  . OLANZapine (ZYPREXA) 15 MG tablet   0  . omeprazole (PRILOSEC) 10 MG capsule   1  . pioglitazone (ACTOS) 45 MG tablet Take 1 tablet (45 mg total) by mouth daily. 30 tablet 6  . VIIBRYD 40 MG TABS Take 40 mg by mouth every morning.  0   No current facility-administered medications for this visit.    Previous Psychotropic Medications:  Patient reported that she has previously tried Risperdal and Seroquel and Zyprexa Paxil and Celexa. She reported that  she has history of adverse reaction to Abilify when she had hives. She stated that she has never tried Depakote or lithium and Tegretol. She denied any history of suicide attempts in the past.  Substance Abuse History in the last 12 months:  No.  Consequences of Substance Abuse: Negative NA  Medical Decision Making:  Review of Psycho-Social Stressors (1) and Established Problem, Worsening (2)  Treatment Plan Summary: Medication management  Discussed with patient what the medications treatment risks benefits and alternatives I will start her on Geodon 20 mg by mouth twice a day as he is gradually trying to titrate her on the Geodon and will decrease the dose of olanzapine to 10 mg at bedtime. 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 Juanetta Gosling in November Will follow-up in a month    More than 50% of the time spent in psychoeducation, counseling and coordination of care.    This note was generated in part or whole with voice recognition software. Voice regonition is usually quite accurate but there are transcription errors that can and very often do occur. I apologize for any typographical errors that were not detected and corrected.     Brandy Hale 9/13/20169:07 AM

## 2015-01-10 DIAGNOSIS — Z1231 Encounter for screening mammogram for malignant neoplasm of breast: Secondary | ICD-10-CM | POA: Diagnosis not present

## 2015-01-21 ENCOUNTER — Other Ambulatory Visit: Payer: Self-pay | Admitting: Family Medicine

## 2015-01-21 ENCOUNTER — Telehealth: Payer: Self-pay

## 2015-01-21 NOTE — Telephone Encounter (Signed)
Pt advised as per Dr. Juanetta Gosling and she understood very well.

## 2015-01-21 NOTE — Telephone Encounter (Signed)
Pt's blood sugar ranges from 250-375 mg/dl and as per Dr. Juanetta Gosling increase levemir to 32 units twice a day called and left message. Nisha

## 2015-01-21 NOTE — Telephone Encounter (Signed)
Ok-jh 

## 2015-02-07 ENCOUNTER — Ambulatory Visit (INDEPENDENT_AMBULATORY_CARE_PROVIDER_SITE_OTHER): Payer: Medicare Other | Admitting: Psychiatry

## 2015-02-07 ENCOUNTER — Other Ambulatory Visit: Payer: Self-pay | Admitting: Psychiatry

## 2015-02-07 ENCOUNTER — Encounter: Payer: Self-pay | Admitting: Psychiatry

## 2015-02-07 VITALS — BP 124/68 | HR 86 | Temp 98.1°F | Ht 63.0 in | Wt 225.4 lb

## 2015-02-07 DIAGNOSIS — F3130 Bipolar disorder, current episode depressed, mild or moderate severity, unspecified: Secondary | ICD-10-CM

## 2015-02-07 MED ORDER — OLANZAPINE 5 MG PO TABS: 5.0000 mg | ORAL_TABLET | Freq: Every day | ORAL | Status: DC

## 2015-02-07 MED ORDER — VIIBRYD 40 MG PO TABS: 40.0000 mg | ORAL_TABLET | Freq: Every morning | ORAL | Status: DC

## 2015-02-07 MED ORDER — ZIPRASIDONE HCL 40 MG PO CAPS: 40.0000 mg | ORAL_CAPSULE | Freq: Two times a day (BID) | ORAL | Status: DC

## 2015-02-07 NOTE — Progress Notes (Signed)
Psychiatric Follow up MD/NP Note   Patient Identification: Tabitha [name] MRN:  [id number] Date of Evaluation:  02/07/2015 Referral Source: [facility].  Chief Complaint:   Chief Complaint    Follow-up; Medication Refill     Visit Diagnosis:    ICD-9-CM ICD-10-CM   1. Bipolar I disorder, most recent episode depressed (HCC) 296.50 F31.30    Diagnosis:   Patient Active Problem List   Diagnosis Date Noted  . DM (diabetes mellitus) (HCC) [E11.9] 12/03/2014  . HBP (high blood pressure) [I10] 12/03/2014  . Hyperlipidemia [E78.5] 12/03/2014  . Bipolar disorder in partial remission (HCC) [F31.70] 12/03/2014  . GERD without esophagitis [K21.9] 12/03/2014   History of Present Illness:    Patient is a 44 year old married female who presented for follow-up. She reported that she has been doing better after her medications were adjusted. She is not experiencing any adverse events of the medication. Patient stated that she feels anxious especially around the police man and a security guards and she feels paranoid and reported that she feels that they might be looking at her. She stated that the anxiety symptoms are controlled with the help of the Viibryd. She is also sleeping by taking melatonin 9 mg at night. She has decreased the dose of olanzapine and and is willing to decrease to 5 mg at this time. She is not experiencing any changes in her weight at this time. Patient denied having any auditory or visual hallucinations. She appeared calm and cooperative during the interview She sleeps well at night with the help of melatonin Patient is interested in having her medications adjusted at this time. Elements:  Location:  Paranoia, depression. Associated Signs/Symptoms:  Depression Symptoms:  depressed mood, psychomotor retardation, fatigue, feelings of worthlessness/guilt, difficulty concentrating, hopelessness, anxiety, loss of energy/fatigue, disturbed sleep, weight  gain, increased appetite, (Hypo) Manic Symptoms:  none Anxiety Symptoms:  Obsessive Compulsive Symptoms:   Checking, Handwashing,, Psychotic Symptoms:  Delusions, Ideas of Reference, Paranoia, PTSD Symptoms: Negative NA  Past Medical History:  Past Medical History  Diagnosis Date  . Diabetes mellitus without complication (HCC)   . Mild sleep apnea   . Asthma   . GERD (gastroesophageal reflux disease)   . Depression   . Diabetes mellitus, type II [facility])     Past Surgical History  Procedure Laterality Date  . Cholecystectomy  2006  . Cesarean section  2006/2008   Family History:  Family History  Problem Relation Age of Onset  . Diabetes Father   . Hyperlipidemia Father   . Diabetes Brother   . Lupus Mother    Social History:   Social History   Social History  . Marital Status: Married    Spouse Name: N/A  . Number of Children: N/A  . Years of Education: N/A   Social History Main Topics  . Smoking status: Never Smoker   . Smokeless tobacco: Never Used  . Alcohol Use: No  . Drug Use: No  . Sexual Activity: Yes    Birth Control/ Protection: None   Other Topics Concern  . None   Social History Narrative   Additional Social History:  Married x 11 years. Has a good relationship with husband and has a 41 and 33 yo. 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 She stated that she does not smoke cigarettes and does not use any drugs or alcohol   Musculoskeletal: Strength & Muscle Tone: within normal limits Gait & Station: normal Patient leans: N/A  Psychiatric Specialty Exam: HPI   Review of Systems  Constitutional: Positive for malaise/fatigue. Negative for weight loss.  HENT: Negative for congestion and hearing loss.   Eyes: Negative for photophobia.  Respiratory: Negative for cough and sputum production.   Genitourinary: Negative for urgency.  Psychiatric/Behavioral: Positive for depression and hallucinations. The patient is  nervous/anxious and has insomnia.   All other systems reviewed and are negative.   Blood pressure 124/68, pulse 86, temperature 98.1 F (36.7 C), temperature source Tympanic, height 5\' 3"  (1.6 m), weight 225 lb 6.4 oz (102.241 kg), last menstrual period 01/08/2015, SpO2 95 %.Body mass index is 39.94 kg/(m^2).  General Appearance: Casual and Fairly Groomed  Eye Contact:  Fair  Speech:  Clear and Coherent and Slow  Volume:  Decreased  Mood:  Anxious and Depressed  Affect:  Blunt and Congruent  Thought Process:  Coherent  Orientation:  Full (Time, Place, and Person)  Thought Content:  WDL  Suicidal Thoughts:  No  Homicidal Thoughts:  No  Memory:  Immediate;   Fair  Judgement:  Fair  Insight:  Fair  Psychomotor Activity:  Decreased  Concentration:  Fair  Recall:  [employer]  Fund of Knowledge:Fair  Language: Fair  Akathisia:  No  Handed:  Right  AIMS (if indicated):  none  Assets:  Communication Skills Desire for Improvement Housing Intimacy Physical Health Social Support  ADL's:  Intact  Cognition: WNL  Sleep:  6-7    Is the patient at risk to self?  No. Has the patient been a risk to self in the past 6 months?  No. Has the patient been a risk to self within the distant past?  No. Is the patient a risk to others?  No. Has the patient been a risk to others in the past 6 months?  No. Has the patient been a risk to others within the distant past?  No.  Allergies:   Allergies  Allergen Reactions  . Abilify [Aripiprazole] Hives   Current Medications: Current Outpatient Prescriptions  Medication Sig Dispense Refill  . ACCU-CHEK AVIVA PLUS test strip   1  . atorvastatin (LIPITOR) 20 MG tablet   1  . doxycycline (VIBRAMYCIN) 50 MG capsule   0  . Insulin Detemir (LEVEMIR) 100 UNIT/ML Pen Inject 28 Units into the skin 2 (two) times daily. 15 mL 11  . lisinopril (PRINIVIL,ZESTRIL) 5 MG tablet   1  . metformin (FORTAMET) 1000 MG (OSM) 24 hr tablet Take 1,000 mg by mouth 2 (two)  times daily with a meal.   1  . OLANZapine (ZYPREXA) 15 MG tablet   0  . omeprazole (PRILOSEC) 10 MG capsule   1  . pioglitazone (ACTOS) 45 MG tablet Take 1 tablet (45 mg total) by mouth daily. 30 tablet 6  . VIIBRYD 40 MG TABS Take 1 tablet (40 mg total) by mouth every morning. 30 tablet 1  . ziprasidone (GEODON) 20 MG capsule Take 1 capsule (20 mg total) by mouth 2 (two) times daily with a meal. 60 capsule 1  . FLUVIRIN SUSP ADM 0.5ML IM UTD  0  . OLANZapine (ZYPREXA) 10 MG tablet Take 1 tablet (10 mg total) by mouth at bedtime. (Patient not taking: Reported on 02/07/2015) 30 tablet 0   No current facility-administered medications for this visit.    Previous Psychotropic Medications:  Patient reported that she has previously tried Risperdal and Seroquel and Zyprexa Paxil and Celexa. She reported that she has history of adverse reaction to Abilify when  she had hives. She stated that she has never tried Depakote or lithium and Tegretol. She denied any history of suicide attempts in the past.  Substance Abuse History in the last 12 months:  No.  Consequences of Substance Abuse: Negative NA  Medical Decision Making:  Review of Psycho-Social Stressors (1) and Established Problem, Worsening (2)  Treatment Plan Summary: Medication management   Paranoia I will titrate the dose of Geodon 40 mg by mouth twice a day She will continue on Zyprexa 5 mg at bedtime  Insomnia She takes melatonin 9 mg at bedtime  Anxiety and depression Patient is on Viibryd 40 mg daily   Discussed with her about the medications in detail and she demonstrated understanding She will have her EKG done at her next visit with Dr. Juanetta Gosling in November Will follow-up in a month    More than 50% of the time spent in psychoeducation, counseling and coordination of care.  Time spent with the patient 25 minutes   This note was generated in part or whole with voice recognition software. Voice regonition is usually  quite accurate but there are transcription errors that can and very often do occur. I apologize for any typographical errors that were not detected and corrected.     Brandy Hale 10/13/20163:06 PM

## 2015-02-09 ENCOUNTER — Other Ambulatory Visit: Payer: Self-pay | Admitting: Family Medicine

## 2015-02-25 ENCOUNTER — Other Ambulatory Visit: Payer: Self-pay | Admitting: Family Medicine

## 2015-02-25 MED ORDER — INSULIN PEN NEEDLE 32G X 4 MM MISC: 1.0000 | Freq: Every day | Status: DC

## 2015-02-25 NOTE — Telephone Encounter (Signed)
Pt needs a refill on Nova Twist 32 gauge needles sent to [facility] in [place].  Her call back number is [phone]

## 2015-02-28 ENCOUNTER — Other Ambulatory Visit: Payer: Self-pay | Admitting: Psychiatry

## 2015-02-28 ENCOUNTER — Encounter: Payer: Self-pay | Admitting: Psychiatry

## 2015-02-28 ENCOUNTER — Ambulatory Visit (INDEPENDENT_AMBULATORY_CARE_PROVIDER_SITE_OTHER): Payer: Medicare Other | Admitting: Psychiatry

## 2015-02-28 VITALS — BP 120/72 | HR 86 | Temp 97.5°F | Ht 63.0 in | Wt 221.4 lb

## 2015-02-28 DIAGNOSIS — F251 Schizoaffective disorder, depressive type: Secondary | ICD-10-CM

## 2015-02-28 MED ORDER — VIIBRYD 40 MG PO TABS: 40.0000 mg | ORAL_TABLET | Freq: Every morning | ORAL | Status: DC

## 2015-02-28 MED ORDER — BENZTROPINE MESYLATE 0.5 MG PO TABS
1.0000 mg | ORAL_TABLET | Freq: Two times a day (BID) | ORAL | Status: DC | PRN
Start: 2015-02-28 — End: 2015-02-28

## 2015-02-28 MED ORDER — ZIPRASIDONE HCL 60 MG PO CAPS: 60.0000 mg | ORAL_CAPSULE | Freq: Three times a day (TID) | ORAL | Status: DC

## 2015-02-28 MED ORDER — ALPRAZOLAM 0.25 MG PO TABS: 0.2500 mg | ORAL_TABLET | Freq: Two times a day (BID) | ORAL | Status: DC | PRN

## 2015-02-28 NOTE — Progress Notes (Signed)
Psychiatric Follow up MD/NP Note   Patient Identification: Tabitha [name] MRN:  [id number] Date of Evaluation:  02/28/2015 Referral Source: [facility].  Chief Complaint:   Chief Complaint    Follow-up; Medication Refill; Other     Visit Diagnosis:    ICD-9-CM ICD-10-CM   1. Schizoaffective disorder, depressive type (HCC) 295.70 F25.1    Diagnosis:   Patient Active Problem List   Diagnosis Date Noted  . DM (diabetes mellitus) (HCC) [E11.9] 12/03/2014  . HBP (high blood pressure) [I10] 12/03/2014  . Hyperlipidemia [E78.5] 12/03/2014  . Bipolar disorder in partial remission (HCC) [F31.70] 12/03/2014  . GERD without esophagitis [K21.9] 12/03/2014   History of Present Illness:    Patient is a 44 year old married female who presented for follow-up. She reported that she has been  having paranoia and feels that people are after her. She reported that she is becoming more anxious and is unable to control her symptoms. Patient appeared anxious during the interview. Patient reported that the medications are not helping her and she is taking melatonin to help her sleep at night. She currently denied having any auditory or visual hallucinations. She also tries to control her diabetes and reported that the blood sugar is not under control. She stated that the Geodon is helping her but she does not feel that the Zyprexa is helping her at this time. She has been compliant with her medications. She currently lives with her husband. She denied having any suicidal homicidal ideations or plans. Patient is interested in having her medications adjusted at this time. Elements:  Location:  Paranoia, depression. Associated Signs/Symptoms:  Depression Symptoms:  depressed mood, psychomotor retardation, fatigue, feelings of worthlessness/guilt, difficulty concentrating, hopelessness, anxiety, loss of energy/fatigue, disturbed sleep, weight gain, increased appetite, (Hypo) Manic  Symptoms:  none Anxiety Symptoms:  Obsessive Compulsive Symptoms:   Checking, Handwashing,, Psychotic Symptoms:  Delusions, Ideas of Reference, Paranoia, PTSD Symptoms: Negative NA  Past Medical History:  Past Medical History  Diagnosis Date  . Diabetes mellitus without complication (HCC)   . Mild sleep apnea   . Asthma   . GERD (gastroesophageal reflux disease)   . Depression   . Diabetes mellitus, type II [facility])     Past Surgical History  Procedure Laterality Date  . Cholecystectomy  2006  . Cesarean section  2006/2008   Family History:  Family History  Problem Relation Age of Onset  . Diabetes Father   . Hyperlipidemia Father   . Diabetes Brother   . Lupus Mother    Social History:   Social History   Social History  . Marital Status: Married    Spouse Name: N/A  . Number of Children: N/A  . Years of Education: N/A   Social History Main Topics  . Smoking status: Never Smoker   . Smokeless tobacco: Never Used  . Alcohol Use: No  . Drug Use: No  . Sexual Activity: Yes    Birth Control/ Protection: None   Other Topics Concern  . None   Social History Narrative   Additional Social History:  Married x 11 years. Has a good relationship with husband and has a 34 and 14 yo. 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 She stated that she does not smoke cigarettes and does not use any drugs or alcohol   Musculoskeletal: Strength & Muscle Tone: within normal limits Gait & Station: normal Patient leans: N/A  Psychiatric Specialty Exam: HPI   Review of Systems  Constitutional: Positive for malaise/fatigue. Negative for weight loss.  HENT: Negative for congestion and hearing loss.   Eyes: Negative for photophobia.  Respiratory: Negative for cough and sputum production.   Genitourinary: Negative for urgency.  Psychiatric/Behavioral: Positive for depression and hallucinations. The patient is nervous/anxious and has insomnia.   All other systems  reviewed and are negative.   Blood pressure 120/72, pulse 86, temperature 97.5 F (36.4 C), temperature source Tympanic, height 5\' 3"  (1.6 m), weight 221 lb 6.4 oz (100.426 kg), last menstrual period 02/24/2015, SpO2 97 %.Body mass index is 39.23 kg/(m^2).  General Appearance: Casual and Fairly Groomed  Eye Contact:  Fair  Speech:  Clear and Coherent and Slow  Volume:  Decreased  Mood:  Anxious and Depressed  Affect:  Blunt and Congruent  Thought Process:  Coherent  Orientation:  Full (Time, Place, and Person)  Thought Content:  Obsessions and Paranoid Ideation  Suicidal Thoughts:  No  Homicidal Thoughts:  No  Memory:  Immediate;   Fair  Judgement:  Fair  Insight:  Fair  Psychomotor Activity:  Decreased  Concentration:  Fair  Recall:  [employer]  Fund of Knowledge:Fair  Language: Fair  Akathisia:  No  Handed:  Right  AIMS (if indicated):  none  Assets:  Communication Skills Desire for Improvement Housing Intimacy Physical Health Social Support  ADL's:  Intact  Cognition: WNL  Sleep:  6-7    Is the patient at risk to self?  No. Has the patient been a risk to self in the past 6 months?  No. Has the patient been a risk to self within the distant past?  No. Is the patient a risk to others?  No. Has the patient been a risk to others in the past 6 months?  No. Has the patient been a risk to others within the distant past?  No.  Allergies:   Allergies  Allergen Reactions  . Abilify [Aripiprazole] Hives   Current Medications: Current Outpatient Prescriptions  Medication Sig Dispense Refill  . ACCU-CHEK AVIVA PLUS test strip   1  . atorvastatin (LIPITOR) 20 MG tablet   1  . doxycycline (VIBRAMYCIN) 50 MG capsule   0  . FLUVIRIN SUSP ADM 0.5ML IM UTD  0  . Insulin Detemir (LEVEMIR) 100 UNIT/ML Pen Inject 28 Units into the skin 2 (two) times daily. 15 mL 11  . Insulin Pen Needle (BD PEN NEEDLE NANO U/F) 32G X 4 MM MISC 1 each by Does not apply route daily. 100 each 5  .  lisinopril (PRINIVIL,ZESTRIL) 5 MG tablet   1  . metformin (FORTAMET) 1000 MG (OSM) 24 hr tablet TAKE 1 TABLET BY MOUTH TWICE DAILY 60 tablet 12  . NOVOTWIST 32G X 5 MM MISC USE WITH LEVEMIR PEN BID  5  . OLANZapine (ZYPREXA) 5 MG tablet Take 1 tablet (5 mg total) by mouth at bedtime. 30 tablet 0  . omeprazole (PRILOSEC) 10 MG capsule   1  . pioglitazone (ACTOS) 45 MG tablet Take 1 tablet (45 mg total) by mouth daily. 30 tablet 6  . VIIBRYD 40 MG TABS Take 1 tablet (40 mg total) by mouth every morning. 30 tablet 1  . VIIBRYD 40 MG TABS Take 1 tablet (40 mg total) by mouth every morning. 30 tablet 1  . ziprasidone (GEODON) 40 MG capsule Take 1 capsule (40 mg total) by mouth 2 (two) times daily with a meal. 60 capsule 1   No current facility-administered medications for this visit.  Previous Psychotropic Medications:  Patient reported that she has previously tried Risperdal and Seroquel and Zyprexa Paxil and Celexa. She reported that she has history of adverse reaction to Abilify when she had hives. She stated that she has never tried Depakote or lithium and Tegretol. She denied any history of suicide attempts in the past.  Substance Abuse History in the last 12 months:  No.  Consequences of Substance Abuse: Negative NA  Medical Decision Making:  Review of Psycho-Social Stressors (1) and Established Problem, Worsening (2)  Treatment Plan Summary: Medication management   Paranoia I will titrate the dose of Geodon 60 mg by mouth TID  She will be given benztropine 0.5 mg twice a day to prevent the adverse effects of the Geodon and she agreed with the plan D/C  Zyprexa  Insomnia She takes melatonin 9 mg at bedtime  Anxiety and depression Patient is on Viibryd 40 mg daily I will also add Xanax 0.25 mg by mouth twice a day to help with her anxiety   Discussed with her about the medications in detail and she demonstrated understanding She will have her EKG done at her next visit  with Dr. Juanetta Gosling   Advised patient to go to the emergency room if she notices worsening of her symptoms and she demonstrated understanding Will follow-up in a month    More than 50% of the time spent in psychoeducation, counseling and coordination of care.  Time spent with the patient 25 minutes   This note was generated in part or whole with voice recognition software. Voice regonition is usually quite accurate but there are transcription errors that can and very often do occur. I apologize for any typographical errors that were not detected and corrected.     Brandy Hale 11/3/20163:11 PM

## 2015-03-11 ENCOUNTER — Ambulatory Visit: Payer: Self-pay | Admitting: Psychiatry

## 2015-03-11 DIAGNOSIS — G4733 Obstructive sleep apnea (adult) (pediatric): Secondary | ICD-10-CM | POA: Diagnosis not present

## 2015-03-15 ENCOUNTER — Encounter: Payer: Self-pay | Admitting: Family Medicine

## 2015-03-15 ENCOUNTER — Ambulatory Visit (INDEPENDENT_AMBULATORY_CARE_PROVIDER_SITE_OTHER): Payer: Medicare Other | Admitting: Family Medicine

## 2015-03-15 VITALS — BP 123/82 | HR 71 | Temp 98.0°F | Resp 16 | Ht 62.5 in | Wt 229.0 lb

## 2015-03-15 DIAGNOSIS — Z794 Long term (current) use of insulin: Secondary | ICD-10-CM

## 2015-03-15 DIAGNOSIS — E119 Type 2 diabetes mellitus without complications: Secondary | ICD-10-CM | POA: Diagnosis not present

## 2015-03-15 LAB — POCT GLYCOSYLATED HEMOGLOBIN (HGB A1C): HEMOGLOBIN A1C: 7.4

## 2015-03-15 MED ORDER — INSULIN DETEMIR 100 UNIT/ML FLEXPEN: 32.0000 [IU] | PEN_INJECTOR | Freq: Two times a day (BID) | SUBCUTANEOUS | Status: DC

## 2015-03-15 NOTE — Patient Instructions (Signed)
Call BSs to office in 1 month

## 2015-03-15 NOTE — Progress Notes (Signed)
Name: Tabitha HouseholderKathleen J Detlefsen   MRN: [id number]    DOB: Jan 13, 1971   Date:03/15/2015       Progress Note  Subjective  Chief Complaint  Chief Complaint  Patient presents with  . Diabetes    HPI Here for f/u of DM.  Feeling well.  Here Levemir was increased to 28 units last month.  Her BSs are running 200-300 still.  Her weight has increased 8# in last month.    No problem-specific assessment & plan notes found for this encounter.   Past Medical History  Diagnosis Date  . Diabetes mellitus without complication (HCC)   . Mild sleep apnea   . Asthma   . GERD (gastroesophageal reflux disease)   . Depression   . Diabetes mellitus, type II [facility](HCC)     Past Surgical History  Procedure Laterality Date  . Cholecystectomy  2006  . Cesarean section  2006/2008    Family History  Problem Relation Age of Onset  . Diabetes Father   . Hyperlipidemia Father   . Diabetes Brother   . Lupus Mother     Social History   Social History  . Marital Status: Married    Spouse Name: N/A  . Number of Children: N/A  . Years of Education: N/A   Occupational History  . Not on file.   Social History Main Topics  . Smoking status: Never Smoker   . Smokeless tobacco: Never Used  . Alcohol Use: No  . Drug Use: No  . Sexual Activity: Yes    Birth Control/ Protection: None   Other Topics Concern  . Not on file   Social History Narrative     Current outpatient prescriptions:  .  ACCU-CHEK AVIVA PLUS test strip, , Disp: , Rfl: 1 .  ALPRAZolam (XANAX) 0.25 MG tablet, Take 1 tablet (0.25 mg total) by mouth 2 (two) times daily as needed for anxiety., Disp: 60 tablet, Rfl: 1 .  atorvastatin (LIPITOR) 20 MG tablet, , Disp: , Rfl: 1 .  benztropine (COGENTIN) 0.5 MG tablet, TAKE 2 TABLETS BY MOUTH TWICE DAILY AS NEEDED FOR TREMORS, Disp: 360 tablet, Rfl: 1 .  doxycycline (VIBRAMYCIN) 50 MG capsule, , Disp: , Rfl: 0 .  FLUVIRIN SUSP, ADM 0.5ML IM UTD, Disp: , Rfl: 0 .  Insulin Detemir (LEVEMIR) 100  UNIT/ML Pen, Inject 32 Units into the skin 2 (two) times daily., Disp: 15 mL, Rfl: 11 .  Insulin Pen Needle (BD PEN NEEDLE NANO U/F) 32G X 4 MM MISC, 1 each by Does not apply route daily., Disp: 100 each, Rfl: 5 .  lisinopril (PRINIVIL,ZESTRIL) 5 MG tablet, , Disp: , Rfl: 1 .  metformin (FORTAMET) 1000 MG (OSM) 24 hr tablet, TAKE 1 TABLET BY MOUTH TWICE DAILY, Disp: 60 tablet, Rfl: 12 .  NOVOTWIST 32G X 5 MM MISC, USE WITH LEVEMIR PEN BID, Disp: , Rfl: 5 .  omeprazole (PRILOSEC) 10 MG capsule, , Disp: , Rfl: 1 .  pioglitazone (ACTOS) 45 MG tablet, Take 1 tablet (45 mg total) by mouth daily., Disp: 30 tablet, Rfl: 6 .  VIIBRYD 40 MG TABS, Take 1 tablet (40 mg total) by mouth every morning., Disp: 30 tablet, Rfl: 1 .  ziprasidone (GEODON) 60 MG capsule, Take 1 capsule (60 mg total) by mouth 3 (three) times daily with meals., Disp: 90 capsule, Rfl: 1  Allergies  Allergen Reactions  . Abilify [Aripiprazole] Hives     Review of Systems  Constitutional: Negative for fever, chills, weight loss and  malaise/fatigue.  HENT: Negative for hearing loss.   Eyes: Negative for blurred vision and double vision.  Respiratory: Negative for cough, shortness of breath and wheezing.   Cardiovascular: Negative for chest pain, palpitations and leg swelling.  Gastrointestinal: Negative for heartburn, abdominal pain and blood in stool.  Genitourinary: Negative for dysuria, urgency and frequency.  Musculoskeletal: Negative for myalgias and joint pain.  Skin: Negative for rash.  Neurological: Negative for dizziness, tremors, weakness and headaches.      Objective  Filed Vitals:   03/15/15 1402  BP: 123/82  Pulse: 71  Temp: 98 F (36.7 C)  TempSrc: Oral  Resp: 16  Height: 5' 2.5" (1.588 m)  Weight: 229 lb (103.874 kg)    Physical Exam  Constitutional: She is oriented to person, place, and time and well-developed, well-nourished, and in no distress. No distress.  HENT:  Head: Normocephalic and  atraumatic.  Eyes: Conjunctivae and EOM are normal. Pupils are equal, round, and reactive to light. No scleral icterus.  Neck: Normal range of motion. Neck supple. Carotid bruit is not present. No thyromegaly present.  Cardiovascular: Normal rate, regular rhythm, normal heart sounds and intact distal pulses.  Exam reveals no gallop and no friction rub.   No murmur heard. Pulmonary/Chest: Effort normal and breath sounds normal. No respiratory distress. She has no wheezes. She has no rales.  Abdominal: Soft. Bowel sounds are normal. She exhibits no distension, no abdominal bruit and no mass. There is no tenderness.  Musculoskeletal: She exhibits no edema.  Lymphadenopathy:    She has no cervical adenopathy.  Neurological: She is alert and oriented to person, place, and time.  Vitals reviewed.      Recent Results (from the past 2160 hour(s))  POCT HgB A1C     Status: Abnormal   Collection Time: 03/15/15  2:08 PM  Result Value Ref Range   Hemoglobin A1C 7.4      Assessment & Plan  Problem List Items Addressed This Visit      Endocrine   DM (diabetes mellitus) (HCC)   Relevant Medications   Insulin Detemir (LEVEMIR) 100 UNIT/ML Pen   Other Relevant Orders   POCT HgB A1C (Completed)    Other Visit Diagnoses    Type 2 diabetes mellitus without complication, without long-term current use of insulin (HCC)    -  Primary    Relevant Medications    Insulin Detemir (LEVEMIR) 100 UNIT/ML Pen    Other Relevant Orders    POCT HgB A1C (Completed)       Meds ordered this encounter  Medications  . Insulin Detemir (LEVEMIR) 100 UNIT/ML Pen    Sig: Inject 32 Units into the skin 2 (two) times daily.    Dispense:  15 mL    Refill:  11   1. Type 2 diabetes mellitus without complication, without long-term current use of insulin (HCC)  - POCT HgB A1C-7.4 - Insulin Detemir (LEVEMIR) 100 UNIT/ML Pen; Inject 32 Units into the skin 2 (two) times daily.  Dispense: 15 mL; Refill:  11

## 2015-03-25 ENCOUNTER — Ambulatory Visit: Payer: Medicare Other | Admitting: Family Medicine

## 2015-03-28 ENCOUNTER — Encounter: Payer: Self-pay | Admitting: Psychiatry

## 2015-03-28 ENCOUNTER — Ambulatory Visit (INDEPENDENT_AMBULATORY_CARE_PROVIDER_SITE_OTHER): Payer: 59 | Admitting: Psychiatry

## 2015-03-28 VITALS — BP 118/82 | HR 78 | Temp 98.0°F | Ht 62.5 in | Wt 226.8 lb

## 2015-03-28 DIAGNOSIS — F418 Other specified anxiety disorders: Secondary | ICD-10-CM | POA: Diagnosis not present

## 2015-03-28 DIAGNOSIS — F259 Schizoaffective disorder, unspecified: Secondary | ICD-10-CM | POA: Diagnosis not present

## 2015-03-28 MED ORDER — VIIBRYD 40 MG PO TABS: 40.0000 mg | ORAL_TABLET | Freq: Every morning | ORAL | Status: DC

## 2015-03-28 MED ORDER — ALPRAZOLAM 0.25 MG PO TABS: 0.2500 mg | ORAL_TABLET | Freq: Two times a day (BID) | ORAL | Status: DC | PRN

## 2015-03-28 NOTE — Progress Notes (Signed)
Psychiatric Follow up MD/NP Note   Patient Identification: Tabitha [name] MRN:  [id number] Date of Evaluation:  03/28/2015 Referral Source: [facility].  Chief Complaint:   Chief Complaint    Anxiety; Follow-up; Medication Refill; Stress     Visit Diagnosis:    ICD-9-CM ICD-10-CM   1. Schizoaffective disorder, unspecified type (HCC)  F25.9    Diagnosis:   Patient Active Problem List   Diagnosis Date Noted  . DM (diabetes mellitus) (HCC) [E11.9] 12/03/2014  . HBP (high blood pressure) [I10] 12/03/2014  . Hyperlipidemia [E78.5] 12/03/2014  . Bipolar disorder in partial remission (HCC) [F31.70] 12/03/2014  . GERD without esophagitis [K21.9] 12/03/2014   History of Present Illness:    Patient is a 44 year old married female who presented for follow-up. She reported that she has started improving on her medications. She reported that she is not taking Geodon 3 times daily and it has helped with her paranoia and delusional thinking. She feels more relaxed and is able to go to the church and attend the choir on a regular basis. She also takes Xanax on a when necessary basis. Patient reported that she is able to sleep well at night. Patient reported that she is not having any adverse effects of the medications as she is also taking benztropine on a regular basis. She appeared more calm and alert during the interview. She currently denied having any suicidal homicidal ideations or plans. Her blood sugar is under control at this time with the help of metformin. She reported that she is not experiencing any extrapyramidal symptoms. She denied having any perceptual disturbances she denied having any suicidal ideations or plans. She is looking forward to have the holidays with her family members as her mother-in-law and sister-in-law are coming.   She reported that she has enough supply of Geodon at home   Past Medical History:  Past Medical History  Diagnosis Date  .  Diabetes mellitus without complication (HCC)   . Mild sleep apnea   . Asthma   . GERD (gastroesophageal reflux disease)   . Depression   . Diabetes mellitus, type II [facility])     Past Surgical History  Procedure Laterality Date  . Cholecystectomy  2006  . Cesarean section  2006/2008   Family History:  Family History  Problem Relation Age of Onset  . Diabetes Father   . Hyperlipidemia Father   . Diabetes Brother   . Lupus Mother    Social History:   Social History   Social History  . Marital Status: Married    Spouse Name: N/A  . Number of Children: N/A  . Years of Education: N/A   Social History Main Topics  . Smoking status: Never Smoker   . Smokeless tobacco: Never Used  . Alcohol Use: No  . Drug Use: No  . Sexual Activity: Yes    Birth Control/ Protection: None   Other Topics Concern  . None   Social History Narrative   Additional Social History:  Married x 11 years. Has a good relationship with husband and has a 20 and 57 yo. 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 She stated that she does not smoke cigarettes and does not use any drugs or alcohol   Musculoskeletal: Strength & Muscle Tone: within normal limits Gait & Station: normal Patient leans: N/A  Psychiatric Specialty Exam: Anxiety Symptoms include insomnia and nervous/anxious behavior.      Review of Systems  Constitutional: Positive for malaise/fatigue.  Negative for weight loss.  HENT: Negative for congestion and hearing loss.   Eyes: Negative for photophobia.  Respiratory: Negative for cough and sputum production.   Genitourinary: Negative for urgency.  Psychiatric/Behavioral: Positive for depression and hallucinations. The patient is nervous/anxious and has insomnia.   All other systems reviewed and are negative.   Blood pressure 118/82, pulse 78, temperature 98 F (36.7 C), temperature source Tympanic, height 5' 2.5" (1.588 m), weight 226 lb 12.8 oz (102.876 kg), last  menstrual period 03/22/2015, SpO2 98 %.Body mass index is 40.8 kg/(m^2).  General Appearance: Casual and Fairly Groomed  Eye Contact:  Fair  Speech:  Clear and Coherent and Slow  Volume:  Decreased  Mood:  Anxious and Depressed  Affect:  Blunt and Congruent  Thought Process:  Coherent  Orientation:  Full (Time, Place, and Person)  Thought Content:  Obsessions and Paranoid Ideation  Suicidal Thoughts:  No  Homicidal Thoughts:  No  Memory:  Immediate;   Fair  Judgement:  Fair  Insight:  Fair  Psychomotor Activity:  Decreased  Concentration:  Fair  Recall:  [employer]  Fund of Knowledge:Fair  Language: Fair  Akathisia:  No  Handed:  Right  AIMS (if indicated):  none  Assets:  Communication Skills Desire for Improvement Housing Intimacy Physical Health Social Support  ADL's:  Intact  Cognition: WNL  Sleep:  6-7    Is the patient at risk to self?  No. Has the patient been a risk to self in the past 6 months?  No. Has the patient been a risk to self within the distant past?  No. Is the patient a risk to others?  No. Has the patient been a risk to others in the past 6 months?  No. Has the patient been a risk to others within the distant past?  No.  Allergies:   Allergies  Allergen Reactions  . Abilify [Aripiprazole] Hives   Current Medications: Current Outpatient Prescriptions  Medication Sig Dispense Refill  . ACCU-CHEK AVIVA PLUS test strip   1  . ALPRAZolam (XANAX) 0.25 MG tablet Take 1 tablet (0.25 mg total) by mouth 2 (two) times daily as needed for anxiety. 60 tablet 1  . atorvastatin (LIPITOR) 20 MG tablet   1  . benztropine (COGENTIN) 0.5 MG tablet TAKE 2 TABLETS BY MOUTH TWICE DAILY AS NEEDED FOR TREMORS 360 tablet 1  . doxycycline (VIBRAMYCIN) 50 MG capsule   0  . FLUVIRIN SUSP ADM 0.5ML IM UTD  0  . Insulin Detemir (LEVEMIR) 100 UNIT/ML Pen Inject 32 Units into the skin 2 (two) times daily. 15 mL 11  . Insulin Pen Needle (BD PEN NEEDLE NANO U/F) 32G X 4 MM MISC 1  each by Does not apply route daily. 100 each 5  . lisinopril (PRINIVIL,ZESTRIL) 5 MG tablet   1  . metformin (FORTAMET) 1000 MG (OSM) 24 hr tablet TAKE 1 TABLET BY MOUTH TWICE DAILY 60 tablet 12  . NOVOTWIST 32G X 5 MM MISC USE WITH LEVEMIR PEN BID  5  . omeprazole (PRILOSEC) 10 MG capsule   1  . pioglitazone (ACTOS) 45 MG tablet Take 1 tablet (45 mg total) by mouth daily. 30 tablet 6  . VIIBRYD 40 MG TABS Take 1 tablet (40 mg total) by mouth every morning. 30 tablet 1  . ziprasidone (GEODON) 60 MG capsule Take 1 capsule (60 mg total) by mouth 3 (three) times daily with meals. 90 capsule 1   No current facility-administered medications for this  visit.    Previous Psychotropic Medications:  Patient reported that she has previously tried Risperdal and Seroquel and Zyprexa Paxil and Celexa. She reported that she has history of adverse reaction to Abilify when she had hives. She stated that she has never tried Depakote or lithium and Tegretol. She denied any history of suicide attempts in the past.  Substance Abuse History in the last 12 months:  No.  Consequences of Substance Abuse: Negative NA  Medical Decision Making:  Review of Psycho-Social Stressors (1) and Established Problem, Worsening (2)  Treatment Plan Summary: Medication management   Paranoia Continue  Geodon 60 mg by mouth TID  She will be given benztropine 0.5 mg twice a day to prevent the adverse effects of the Geodon  Insomnia She takes melatonin 9 mg at bedtime  Anxiety and depression Patient is on Viibryd 40 mg daily Continue Xanax 0.25 mg by mouth twice a day to help with her anxiety   Discussed with her about the medications in detail and she demonstrated understanding   Advised patient to go to the emergency room if she notices worsening of her symptoms and she demonstrated understanding Will follow-up in a month    More than 50% of the time spent in psychoeducation, counseling and coordination of care.   Time spent with the patient 25 minutes   This note was generated in part or whole with voice recognition software. Voice regonition is usually quite accurate but there are transcription errors that can and very often do occur. I apologize for any typographical errors that were not detected and corrected.     Brandy Hale 12/1/20164:07 PM

## 2015-04-23 NOTE — Progress Notes (Signed)
Duplicated error

## 2015-04-30 ENCOUNTER — Ambulatory Visit: Payer: Medicare Other | Admitting: Psychiatry

## 2015-04-30 ENCOUNTER — Other Ambulatory Visit: Payer: Self-pay | Admitting: Family Medicine

## 2015-05-01 ENCOUNTER — Telehealth: Payer: Self-pay | Admitting: Family Medicine

## 2015-05-01 ENCOUNTER — Other Ambulatory Visit: Payer: Self-pay | Admitting: Family Medicine

## 2015-05-01 MED ORDER — ALBUTEROL SULFATE HFA 108 (90 BASE) MCG/ACT IN AERS: 2.0000 | INHALATION_SPRAY | Freq: Four times a day (QID) | RESPIRATORY_TRACT | Status: DC | PRN

## 2015-05-01 NOTE — Telephone Encounter (Signed)
Pt needs a prescription for flovent inhaler [place] [facility] [place].  [facility]  Her call back number is [phone]

## 2015-05-01 NOTE — Telephone Encounter (Signed)
Called patient to verify what she takes. She says she thought is was Flovent. Patient has been on Qvar and Albuterol in the past. [facility]

## 2015-05-09 ENCOUNTER — Other Ambulatory Visit: Payer: Self-pay

## 2015-05-09 MED ORDER — ZIPRASIDONE HCL 60 MG PO CAPS: 60.0000 mg | ORAL_CAPSULE | Freq: Three times a day (TID) | ORAL | Status: DC

## 2015-05-09 NOTE — Telephone Encounter (Signed)
pt needs refill on ziprasidone pt was last seen on  03-28-15 next appt  05-21-15

## 2015-05-14 ENCOUNTER — Other Ambulatory Visit: Payer: Self-pay | Admitting: Psychiatry

## 2015-05-14 ENCOUNTER — Ambulatory Visit (INDEPENDENT_AMBULATORY_CARE_PROVIDER_SITE_OTHER): Payer: 59 | Admitting: Psychiatry

## 2015-05-14 ENCOUNTER — Encounter: Payer: Self-pay | Admitting: Psychiatry

## 2015-05-14 VITALS — BP 124/76 | HR 94 | Temp 97.7°F | Ht 62.5 in | Wt 226.8 lb

## 2015-05-14 DIAGNOSIS — F251 Schizoaffective disorder, depressive type: Secondary | ICD-10-CM | POA: Diagnosis not present

## 2015-05-14 MED ORDER — TRAZODONE HCL 100 MG PO TABS
100.0000 mg | ORAL_TABLET | Freq: Every day | ORAL | Status: DC
Start: 2015-05-14 — End: 2015-05-14

## 2015-05-14 MED ORDER — ALPRAZOLAM 0.25 MG PO TABS: 0.2500 mg | ORAL_TABLET | Freq: Every evening | ORAL | Status: DC | PRN

## 2015-05-14 MED ORDER — VILAZODONE HCL 40 MG PO TABS: 40.0000 mg | ORAL_TABLET | Freq: Every morning | ORAL | Status: DC

## 2015-05-14 MED ORDER — VIIBRYD 40 MG PO TABS: 40.0000 mg | ORAL_TABLET | Freq: Every morning | ORAL | Status: DC

## 2015-05-14 MED ORDER — DIVALPROEX SODIUM 500 MG PO DR TAB: 500.0000 mg | DELAYED_RELEASE_TABLET | Freq: Every day | ORAL | Status: DC

## 2015-05-14 NOTE — Progress Notes (Signed)
Psychiatric Follow up MD/NP Note   Patient Identification: Tabitha [name] MRN:  [id number] Date of Evaluation:  05/14/2015 Referral Source: [facility].  Chief Complaint:   Chief Complaint    Follow-up; Hallucinations     Visit Diagnosis:  No diagnosis found. Diagnosis:   Patient Active Problem List   Diagnosis Date Noted  . DM (diabetes mellitus) (HCC) [E11.9] 12/03/2014  . HBP (high blood pressure) [I10] 12/03/2014  . Hyperlipidemia [E78.5] 12/03/2014  . Bipolar disorder in partial remission (HCC) [F31.70] 12/03/2014  . GERD without esophagitis [K21.9] 12/03/2014   History of Present Illness:    Patient is a 45 year old married female who presented for follow-up. She was evaluated in the presence of her husband and 2 sons. Patient reported that she has been becoming more impulsive since Friday. When she went to the store she had thoughts of grabbing something from the store without the knowledge of the store clerk. However she did not do any impulsive behavior. When she was riding back home she started having thoughts to jump out of the truck. However she did not act on them.  She told her husband about the same. Patient reported that she when she was coming this morning for the appointments she has the same thoughts of jumping order of the truck.  I discussed this case at length with her patient and her husband that she needs inpatient admission to decrease her impulsive thoughts but she declined and reported that she does not want to be admitted to the hospital as she takes care of her 2 sons at home and they are home schooled. Her husband also agreed that she does not want to be admitted at this time. She reported that she wants to start taking new medications and will be safe. They agreed that the husband will bring her back to the emergency room if they notice worsening of her symptoms. She reported that she is currently stressed out as her mother-in-law who has  Dementia is returning back to their home tonight. Her husband reported that they take turns between him and her sister. . The mother-in-law is 67 years old.   She currently denied having any suicidal homicidal ideations or plans. Patient reported that she is having difficulty sleeping and wants to try trazodone as well. We discussed about different medications and I will start her on Depakote for mood stabilization. Patient and her husband made a safety plan at this time. She appeared calm during the interview. She denied having any perceptual  disturbances. She reported that she has enough supply of Geodon at home   Past Medical History:  Past Medical History  Diagnosis Date  . Diabetes mellitus without complication (HCC)   . Mild sleep apnea   . Asthma   . GERD (gastroesophageal reflux disease)   . Depression   . Diabetes mellitus, type II [facility])     Past Surgical History  Procedure Laterality Date  . Cholecystectomy  2006  . Cesarean section  2006/2008   Family History:  Family History  Problem Relation Age of Onset  . Diabetes Father   . Hyperlipidemia Father   . Diabetes Brother   . Lupus Mother    Social History:   Social History   Social History  . Marital Status: Married    Spouse Name: N/A  . Number of Children: N/A  . Years of Education: N/A   Social History Main Topics  . Smoking status: Never Smoker   . Smokeless tobacco:  Never Used  . Alcohol Use: No  . Drug Use: No  . Sexual Activity: Yes    Birth Control/ Protection: None   Other Topics Concern  . Not on file   Social History Narrative   Additional Social History:  Married x 11 years. Has a good relationship with husband and has a 56 and 50 yo. 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 She stated that she does not smoke cigarettes and does not use any drugs or alcohol   Musculoskeletal: Strength & Muscle Tone: within normal limits Gait & Station: normal Patient leans:  N/A  Psychiatric Specialty Exam: Anxiety Symptoms include insomnia and nervous/anxious behavior.      Review of Systems  Constitutional: Positive for malaise/fatigue. Negative for weight loss.  HENT: Negative for congestion and hearing loss.   Eyes: Negative for photophobia.  Respiratory: Negative for cough and sputum production.   Genitourinary: Negative for urgency.  Psychiatric/Behavioral: Positive for depression and hallucinations. The patient is nervous/anxious and has insomnia.   All other systems reviewed and are negative.   There were no vitals taken for this visit.There is no weight on file to calculate BMI.  General Appearance: Casual and Fairly Groomed  Eye Contact:  Fair  Speech:  Clear and Coherent and Slow  Volume:  Decreased  Mood:  Anxious and Depressed  Affect:  Blunt and Congruent  Thought Process:  Coherent  Orientation:  Full (Time, Place, and Person)  Thought Content:  Obsessions and Paranoid Ideation  Suicidal Thoughts:  No  Homicidal Thoughts:  No  Memory:  Immediate;   Fair  Judgement:  Fair  Insight:  Fair  Psychomotor Activity:  Decreased  Concentration:  Fair  Recall:  [employer] of Knowledge:Fair  Language: Fair  Akathisia:  No  Handed:  Right  AIMS (if indicated):  none  Assets:  Communication Skills Desire for Improvement Housing Intimacy Physical Health Social Support  ADL's:  Intact  Cognition: WNL  Sleep:  6-7    Is the patient at risk to self?  No. Has the patient been a risk to self in the past 6 months?  No. Has the patient been a risk to self within the distant past?  No. Is the patient a risk to others?  No. Has the patient been a risk to others in the past 6 months?  No. Has the patient been a risk to others within the distant past?  No.  Allergies:   Allergies  Allergen Reactions  . Abilify [Aripiprazole] Hives   Current Medications: Current Outpatient Prescriptions  Medication Sig Dispense Refill  . ACCU-CHEK  AVIVA PLUS test strip   1  . albuterol (PROVENTIL HFA;VENTOLIN HFA) 108 (90 Base) MCG/ACT inhaler Inhale 2 puffs into the lungs every 6 (six) hours as needed for wheezing or shortness of breath. 1 Inhaler 2  . ALPRAZolam (XANAX) 0.25 MG tablet Take 1 tablet (0.25 mg total) by mouth 2 (two) times daily as needed for anxiety. 60 tablet 1  . atorvastatin (LIPITOR) 20 MG tablet TAKE 1 TABLET BY MOUTH EVERY NIGHT AT BEDTIME 30 tablet 12  . benztropine (COGENTIN) 0.5 MG tablet TAKE 2 TABLETS BY MOUTH TWICE DAILY AS NEEDED FOR TREMORS 360 tablet 1  . doxycycline (VIBRAMYCIN) 50 MG capsule   0  . FLUVIRIN SUSP ADM 0.5ML IM UTD  0  . Insulin Detemir (LEVEMIR) 100 UNIT/ML Pen Inject 32 Units into the skin 2 (two) times daily. 15 mL 11  .  Insulin Pen Needle (BD PEN NEEDLE NANO U/F) 32G X 4 MM MISC 1 each by Does not apply route daily. 100 each 5  . lisinopril (PRINIVIL,ZESTRIL) 5 MG tablet   1  . metformin (FORTAMET) 1000 MG (OSM) 24 hr tablet TAKE 1 TABLET BY MOUTH TWICE DAILY 60 tablet 12  . NOVOTWIST 32G X 5 MM MISC USE WITH LEVEMIR PEN BID  5  . omeprazole (PRILOSEC) 10 MG capsule   1  . pioglitazone (ACTOS) 45 MG tablet Take 1 tablet (45 mg total) by mouth daily. 30 tablet 6  . VIIBRYD 40 MG TABS Take 1 tablet (40 mg total) by mouth every morning. 90 tablet 1  . ziprasidone (GEODON) 60 MG capsule Take 1 capsule (60 mg total) by mouth 3 (three) times daily with meals. 90 capsule 0   No current facility-administered medications for this visit.    Previous Psychotropic Medications:  Patient reported that she has previously tried Risperdal and Seroquel and Zyprexa Paxil and Celexa. She reported that she has history of adverse reaction to Abilify when she had hives. She stated that she has never tried Depakote or lithium and Tegretol. She denied any history of suicide attempts in the past.  Substance Abuse History in the last 12 months:  No.  Consequences of Substance Abuse: Negative NA  Medical  Decision Making:  Review of Psycho-Social Stressors (1) and Established Problem, Worsening (2)  Treatment Plan Summary: Medication management   Paranoia Continue  Geodon 60 mg by mouth TID  I will also add Depakote 500 mg by mouth daily at bedtime. She will be given benztropine 0.5 mg twice a day to prevent the adverse effects of the Geodon He is not taking benztropine at this time. Insomnia Will  start her on trazodone 100 mg by mouth daily at bedtime when necessary for insomnia  Anxiety and depression Patient is on Viibryd 40 mg daily Continue Xanax 0.25 mg by mouth twice a day to help with her anxiety   Discussed with her about the medications in detail and she demonstrated understanding Advised her that she needed to come back to the emergency room if she notices worsening of her symptoms and thoughts of jumping out of the car and she agreed with the plan. Her husband also agreed with the plan and made a safety plan.   Advised patient to go to the emergency room if she notices worsening of her symptoms and she demonstrated understanding     More than 50% of the time spent in psychoeducation, counseling and coordination of care.  Time spent with the patient 25 minutes Follow-up in 1 month   This note was generated in part or whole with voice recognition software. Voice regonition is usually quite accurate but there are transcription errors that can and very often do occur. I apologize for any typographical errors that were not detected and corrected.    Brandy Hale, MD    1/17/201710:50 AM

## 2015-05-21 ENCOUNTER — Ambulatory Visit: Payer: Medicare Other | Admitting: Psychiatry

## 2015-06-13 ENCOUNTER — Other Ambulatory Visit: Payer: Self-pay | Admitting: Family Medicine

## 2015-06-13 ENCOUNTER — Other Ambulatory Visit: Payer: Self-pay | Admitting: Psychiatry

## 2015-06-13 ENCOUNTER — Ambulatory Visit (INDEPENDENT_AMBULATORY_CARE_PROVIDER_SITE_OTHER): Payer: 59 | Admitting: Psychiatry

## 2015-06-13 DIAGNOSIS — F259 Schizoaffective disorder, unspecified: Secondary | ICD-10-CM | POA: Diagnosis not present

## 2015-06-13 MED ORDER — TRAZODONE HCL 100 MG PO TABS: 100.0000 mg | ORAL_TABLET | Freq: Every day | ORAL | Status: DC

## 2015-06-13 MED ORDER — VILAZODONE HCL 40 MG PO TABS: 40.0000 mg | ORAL_TABLET | Freq: Every morning | ORAL | Status: DC

## 2015-06-13 MED ORDER — DIVALPROEX SODIUM 500 MG PO DR TAB: 500.0000 mg | DELAYED_RELEASE_TABLET | ORAL | Status: DC

## 2015-06-13 MED ORDER — ZIPRASIDONE HCL 60 MG PO CAPS: 60.0000 mg | ORAL_CAPSULE | Freq: Three times a day (TID) | ORAL | Status: DC

## 2015-06-13 NOTE — Progress Notes (Signed)
Psychiatric Follow up MD/NP Note   Patient Identification: Tabitha [name] MRN:  [id number] Date of Evaluation:  06/13/2015 Referral Source: [facility].  Chief Complaint:   Chief Complaint    Follow-up     Visit Diagnosis:    ICD-9-CM ICD-10-CM   1. Schizoaffective disorder, unspecified type (HCC)  F25.9    Diagnosis:   Patient Active Problem List   Diagnosis Date Noted  . DM (diabetes mellitus) (HCC) [E11.9] 12/03/2014  . HBP (high blood pressure) [I10] 12/03/2014  . Hyperlipidemia [E78.5] 12/03/2014  . Bipolar disorder in partial remission (HCC) [F31.70] 12/03/2014  . GERD without esophagitis [K21.9] 12/03/2014   History of Present Illness:    Patient is a 45 year old married female who presented for follow-up.Patient reported that she has started noticing improvement in her symptoms. She reported that the Depakote really helped her and she is not feeling impulsive and does not have thoughts to jump out of the car. She reported that she feels more calm and happy throughout the day. She feels that the combination of medication is really helping her and her mood is now stable. Patient feels that she can sleep well at night with the help of trazodone and melatonin. She appeared more relaxed during the interview. She currently denied having any mood swings anger anxiety or paranoia. She denied having any perceptual disturbances. She denied having any suicidal ideations or plans. Patient reported that she has good support from her family members and her husband and children are helping her. She is compliant with her medications. She does not take alprazolam on a regular basis. She is also not taking benztropine as she is not experiencing any side effects from the Geodon. She has enough supply of both the medications Past Medical History:  Past Medical History  Diagnosis Date  . Diabetes mellitus without complication (HCC)   . Mild sleep apnea   . Asthma   . GERD  (gastroesophageal reflux disease)   . Depression   . Diabetes mellitus, type II [facility])     Past Surgical History  Procedure Laterality Date  . Cholecystectomy  2006  . Cesarean section  2006/2008   Family History:  Family History  Problem Relation Age of Onset  . Diabetes Father   . Hyperlipidemia Father   . Diabetes Brother   . Lupus Mother    Social History:   Social History   Social History  . Marital Status: Married    Spouse Name: N/A  . Number of Children: N/A  . Years of Education: N/A   Social History Main Topics  . Smoking status: Never Smoker   . Smokeless tobacco: Never Used  . Alcohol Use: No  . Drug Use: No  . Sexual Activity: Yes    Birth Control/ Protection: None   Other Topics Concern  . Not on file   Social History Narrative   Additional Social History:  Married x 11 years. Has a good relationship with husband and has a 52 and 74 yo. 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 She stated that she does not smoke cigarettes and does not use any drugs or alcohol   Musculoskeletal: Strength & Muscle Tone: within normal limits Gait & Station: normal Patient leans: N/A  Psychiatric Specialty Exam: Anxiety Symptoms include insomnia and nervous/anxious behavior.      Review of Systems  Constitutional: Positive for malaise/fatigue. Negative for weight loss.  HENT: Negative for congestion and hearing loss.   Eyes: Negative  for photophobia.  Respiratory: Negative for cough and sputum production.   Genitourinary: Negative for urgency.  Psychiatric/Behavioral: Positive for depression and hallucinations. The patient is nervous/anxious and has insomnia.   All other systems reviewed and are negative.   Last menstrual period 04/21/2015.There is no weight on file to calculate BMI.  General Appearance: Casual and Fairly Groomed  Eye Contact:  Fair  Speech:  Clear and Coherent  Volume:  Normal  Mood:  Euthymic  Affect:  Blunt and  Congruent  Thought Process:  Coherent  Orientation:  Full (Time, Place, and Person)  Thought Content:  Obsessions and Paranoid Ideation  Suicidal Thoughts:  No  Homicidal Thoughts:  No  Memory:  Immediate;   Fair  Judgement:  Fair  Insight:  Fair  Psychomotor Activity:  Decreased  Concentration:  Fair  Recall:  [employer] of Knowledge:Fair  Language: Fair  Akathisia:  No  Handed:  Right  AIMS (if indicated):  none  Assets:  Communication Skills Desire for Improvement Housing Intimacy Physical Health Social Support  ADL's:  Intact  Cognition: WNL  Sleep:  6-7    Is the patient at risk to self?  No. Has the patient been a risk to self in the past 6 months?  No. Has the patient been a risk to self within the distant past?  No. Is the patient a risk to others?  No. Has the patient been a risk to others in the past 6 months?  No. Has the patient been a risk to others within the distant past?  No.  Allergies:   Allergies  Allergen Reactions  . Abilify [Aripiprazole] Hives   Current Medications: Current Outpatient Prescriptions  Medication Sig Dispense Refill  . ACCU-CHEK AVIVA PLUS test strip TEST BLOOD SUGAR ONCE DAILY 100 each 12  . albuterol (PROVENTIL HFA;VENTOLIN HFA) 108 (90 Base) MCG/ACT inhaler Inhale 2 puffs into the lungs every 6 (six) hours as needed for wheezing or shortness of breath. 1 Inhaler 2  . ALPRAZolam (XANAX) 0.25 MG tablet Take 1 tablet (0.25 mg total) by mouth at bedtime as needed for anxiety. 30 tablet 1  . atorvastatin (LIPITOR) 20 MG tablet TAKE 1 TABLET BY MOUTH EVERY NIGHT AT BEDTIME 30 tablet 12  . benztropine (COGENTIN) 0.5 MG tablet TAKE 2 TABLETS BY MOUTH TWICE DAILY AS NEEDED FOR TREMORS 360 tablet 1  . divalproex (DEPAKOTE) 500 MG DR tablet TAKE 1 TABLET BY MOUTH DAILY AFTER SUPPER 90 tablet 1  . doxycycline (VIBRAMYCIN) 50 MG capsule   0  . FLUVIRIN SUSP ADM 0.5ML IM UTD  0  . Insulin Detemir (LEVEMIR) 100 UNIT/ML Pen Inject 32 Units  into the skin 2 (two) times daily. 15 mL 11  . Insulin Pen Needle (BD PEN NEEDLE NANO U/F) 32G X 4 MM MISC 1 each by Does not apply route daily. 100 each 5  . lisinopril (PRINIVIL,ZESTRIL) 5 MG tablet   1  . metformin (FORTAMET) 1000 MG (OSM) 24 hr tablet TAKE 1 TABLET BY MOUTH TWICE DAILY 60 tablet 12  . NOVOTWIST 32G X 5 MM MISC USE WITH LEVEMIR PEN BID  5  . omeprazole (PRILOSEC) 10 MG capsule   1  . pioglitazone (ACTOS) 45 MG tablet Take 1 tablet (45 mg total) by mouth daily. 30 tablet 6  . traZODone (DESYREL) 100 MG tablet TAKE 1 TABLET(100 MG) BY MOUTH AT BEDTIME 90 tablet 1  . Vilazodone HCl (VIIBRYD) 40 MG TABS Take 1 tablet (40 mg total) by  mouth every morning. 90 tablet 1  . ziprasidone (GEODON) 60 MG capsule TAKE 1 CAPSULE(60 MG) BY MOUTH THREE TIMES DAILY WITH MEALS 90 capsule 0   No current facility-administered medications for this visit.    Previous Psychotropic Medications:  Patient reported that she has previously tried Risperdal and Seroquel and Zyprexa Paxil and Celexa. She reported that she has history of adverse reaction to Abilify when she had hives. She stated that she has never tried Depakote or lithium and Tegretol. She denied any history of suicide attempts in the past.  Substance Abuse History in the last 12 months:  No.  Consequences of Substance Abuse: Negative NA  Medical Decision Making:  Review of Psycho-Social Stressors (1) and Established Problem, Worsening (2)  Treatment Plan Summary: Medication management   Paranoia Continue  Geodon 60 mg by mouth TID  Continue  Depakote 500 mg by mouth daily at bedtime. She has enough supply of benztropine at home.   Insomnia Continue  trazodone 100 mg by mouth daily at bedtime when necessary for insomnia  Anxiety and depression Patient is on Viibryd 40 mg daily She  has enough supply of Xanax   Discussed with her about the medications in detail and she demonstrated understanding Advised her that she  needed to come back to the emergency room if she notices worsening of her symptoms and thoughts of jumping out of the car and she agreed with the plan. Her husband also agreed with the plan and made a safety plan.   Advised patient to go to the emergency room if she notices worsening of her symptoms and she demonstrated understanding     More than 50% of the time spent in psychoeducation, counseling and coordination of care.  Time spent with the patient 25 minutes Follow-up in 3 months.    This note was generated in part or whole with voice recognition software. Voice regonition is usually quite accurate but there are transcription errors that can and very often do occur. I apologize for any typographical errors that were not detected and corrected.    Brandy Hale, MD    2/16/20173:43 PM

## 2015-06-20 ENCOUNTER — Ambulatory Visit (INDEPENDENT_AMBULATORY_CARE_PROVIDER_SITE_OTHER): Payer: Medicare Other | Admitting: Family Medicine

## 2015-06-20 ENCOUNTER — Encounter: Payer: Self-pay | Admitting: Family Medicine

## 2015-06-20 VITALS — BP 103/65 | HR 71 | Resp 16 | Ht 62.5 in | Wt 227.0 lb

## 2015-06-20 DIAGNOSIS — E119 Type 2 diabetes mellitus without complications: Secondary | ICD-10-CM | POA: Diagnosis not present

## 2015-06-20 DIAGNOSIS — I10 Essential (primary) hypertension: Secondary | ICD-10-CM | POA: Diagnosis not present

## 2015-06-20 DIAGNOSIS — N912 Amenorrhea, unspecified: Secondary | ICD-10-CM | POA: Diagnosis not present

## 2015-06-20 LAB — POCT GLYCOSYLATED HEMOGLOBIN (HGB A1C): Hemoglobin A1C: 7.3

## 2015-06-20 LAB — POCT URINE PREGNANCY: Preg Test, Ur: NEGATIVE

## 2015-06-20 MED ORDER — METFORMIN HCL 500 MG PO TABS: ORAL_TABLET | ORAL | Status: DC

## 2015-06-20 MED ORDER — INSULIN DETEMIR 100 UNIT/ML FLEXPEN: 38.0000 [IU] | PEN_INJECTOR | Freq: Two times a day (BID) | SUBCUTANEOUS | Status: DC

## 2015-06-20 NOTE — Patient Instructions (Signed)
Call blood sugars in 1 week.

## 2015-06-20 NOTE — Progress Notes (Signed)
Name: Tabitha [name]   MRN: [id number]    DOB: [birth year]   Date:06/20/2015       Progress Note  Subjective  Chief Complaint  Chief Complaint  Patient presents with  . Diabetes    A1c 7.4 03/15/2015.......Marland Kitchen  180-250    HPI  Here for f/u of DM.  BSs at home running from 140-280, with most in 220-250 range.  Feeling pretty well overall.   No problem-specific assessment & plan notes found for this encounter.   Past Medical History  Diagnosis Date  . Diabetes mellitus without complication (HCC)   . Mild sleep apnea   . Asthma   . GERD (gastroesophageal reflux disease)   . Depression   . Diabetes mellitus, type II [facility])     Past Surgical History  Procedure Laterality Date  . Cholecystectomy  2006  . Cesarean section  2006/2008    Family History  Problem Relation Age of Onset  . Diabetes Father   . Hyperlipidemia Father   . Diabetes Brother   . Lupus Mother     Social History   Social History  . Marital Status: Married    Spouse Name: N/A  . Number of Children: N/A  . Years of Education: N/A   Occupational History  . Not on file.   Social History Main Topics  . Smoking status: Never Smoker   . Smokeless tobacco: Never Used  . Alcohol Use: No  . Drug Use: No  . Sexual Activity: Yes    Birth Control/ Protection: None   Other Topics Concern  . Not on file   Social History Narrative     Current outpatient prescriptions:  .  ACCU-CHEK AVIVA PLUS test strip, TEST BLOOD SUGAR ONCE DAILY, Disp: 100 each, Rfl: 12 .  albuterol (PROVENTIL HFA;VENTOLIN HFA) 108 (90 Base) MCG/ACT inhaler, Inhale 2 puffs into the lungs every 6 (six) hours as needed for wheezing or shortness of breath., Disp: 1 Inhaler, Rfl: 2 .  ALPRAZolam (XANAX) 0.25 MG tablet, Take 1 tablet (0.25 mg total) by mouth at bedtime as needed for anxiety., Disp: 30 tablet, Rfl: 1 .  atorvastatin (LIPITOR) 20 MG tablet, TAKE 1 TABLET BY MOUTH EVERY NIGHT AT BEDTIME, Disp: 30 tablet, Rfl: 12 .   divalproex (DEPAKOTE) 500 MG DR tablet, Take 1 tablet (500 mg total) by mouth 1 day or 1 dose., Disp: 90 tablet, Rfl: 1 .  doxycycline (VIBRAMYCIN) 50 MG capsule, , Disp: , Rfl: 0 .  Insulin Detemir (LEVEMIR) 100 UNIT/ML Pen, Inject 38 Units into the skin 2 (two) times daily., Disp: 15 mL, Rfl: 11 .  Insulin Pen Needle (BD PEN NEEDLE NANO U/F) 32G X 4 MM MISC, 1 each by Does not apply route daily., Disp: 100 each, Rfl: 5 .  lisinopril (PRINIVIL,ZESTRIL) 5 MG tablet, , Disp: , Rfl: 1 .  metformin (FORTAMET) 1000 MG (OSM) 24 hr tablet, TAKE 1 TABLET BY MOUTH TWICE DAILY, Disp: 60 tablet, Rfl: 12 .  NOVOTWIST 32G X 5 MM MISC, USE WITH LEVEMIR PEN BID, Disp: , Rfl: 5 .  omeprazole (PRILOSEC) 10 MG capsule, , Disp: , Rfl: 1 .  pioglitazone (ACTOS) 45 MG tablet, Take 1 tablet (45 mg total) by mouth daily., Disp: 30 tablet, Rfl: 6 .  traZODone (DESYREL) 100 MG tablet, Take 1 tablet (100 mg total) by mouth at bedtime., Disp: 90 tablet, Rfl: 1 .  Vilazodone HCl (VIIBRYD) 40 MG TABS, Take 1 tablet (40 mg total) by mouth every morning.,  Disp: 90 tablet, Rfl: 1 .  ziprasidone (GEODON) 60 MG capsule, Take 1 capsule (60 mg total) by mouth 3 (three) times daily., Disp: 90 capsule, Rfl: 1  Allergies  Allergen Reactions  . Abilify [Aripiprazole] Hives     Review of Systems  Constitutional: Negative for fever, chills, weight loss and malaise/fatigue.  HENT: Negative for hearing loss.   Eyes: Negative for blurred vision and double vision.  Respiratory: Negative for cough, shortness of breath and wheezing.   Cardiovascular: Negative for chest pain, palpitations and leg swelling.  Gastrointestinal: Negative for heartburn, abdominal pain and blood in stool.  Genitourinary: Negative for dysuria, urgency and frequency.  Musculoskeletal: Negative for myalgias and joint pain.  Skin: Negative for rash.  Neurological: Negative for dizziness, tremors, weakness and headaches.      Objective  Filed Vitals:    06/20/15 1453  BP: 103/65  Pulse: 71  Resp: 16  Height: 5' 2.5" (1.588 m)  Weight: 227 lb (102.967 kg)    Physical Exam  Constitutional: She is oriented to person, place, and time and well-developed, well-nourished, and in no distress. No distress.  HENT:  Head: Normocephalic and atraumatic.  Eyes: Conjunctivae and EOM are normal. Pupils are equal, round, and reactive to light. No scleral icterus.  Neck: Normal range of motion. Neck supple. Carotid bruit is not present. No thyromegaly present.  Cardiovascular: Normal rate, regular rhythm and normal heart sounds.  Exam reveals no gallop and no friction rub.   No murmur heard. Pulmonary/Chest: Effort normal and breath sounds normal. No respiratory distress. She has no wheezes. She has no rales.  Abdominal: Soft. Bowel sounds are normal. She exhibits no distension and no mass. There is no tenderness.  Musculoskeletal: She exhibits no edema.  Lymphadenopathy:    She has no cervical adenopathy.  Neurological: She is alert and oriented to person, place, and time.  Vitals reviewed.      Recent Results (from the past 2160 hour(s))  POCT HgB A1C     Status: Abnormal   Collection Time: 06/20/15  3:12 PM  Result Value Ref Range   Hemoglobin A1C 7.3   POCT urine pregnancy     Status: Normal   Collection Time: 06/20/15  3:13 PM  Result Value Ref Range   Preg Test, Ur Negative Negative     Assessment & Plan  Problem List Items Addressed This Visit      Cardiovascular and Mediastinum   HBP (high blood pressure)     Endocrine   DM (diabetes mellitus) (HCC) - Primary   Relevant Medications   Insulin Detemir (LEVEMIR) 100 UNIT/ML Pen   Other Relevant Orders   POCT HgB A1C (Completed)    Other Visit Diagnoses    Amenorrhea        Relevant Orders    POCT urine pregnancy (Completed)       Meds ordered this encounter  Medications  . Insulin Detemir (LEVEMIR) 100 UNIT/ML Pen    Sig: Inject 38 Units into the skin 2 (two) times  daily.    Dispense:  15 mL    Refill:  11  1. Amenorrhea  - POCT urine pregnancy-neg  2. Type 2 diabetes mellitus without complication, unspecified long term insulin use status (HCC)  - POCT HgB A1C-7.3  3. Essential hypertension Cont. meds  4. Type 2 diabetes mellitus without complication, without long-term current use of insulin (HCC) Cont. Other meds - Insulin Detemir (LEVEMIR) 100 UNIT/ML Pen; Inject 38 Units into the skin 2 (  two) times daily.  Dispense: 15 mL; Refill: 11

## 2015-06-24 DIAGNOSIS — G4733 Obstructive sleep apnea (adult) (pediatric): Secondary | ICD-10-CM | POA: Diagnosis not present

## 2015-07-12 IMAGING — US ABDOMEN ULTRASOUND
1 series · 14 of 25 positions shown · non-contrast
Comparison: MRI of the liver, 01/16/2011. Ultrasound of the
abdomen, 07/28/2010.

CLINICAL DATA: Abnormal GI x-ray

EXAM:
ULTRASOUND ABDOMEN COMPLETE

[Series 1: abdomen ultrasound · 0.31mm/px · 14 of 69 slices shown]
[im 1/69]
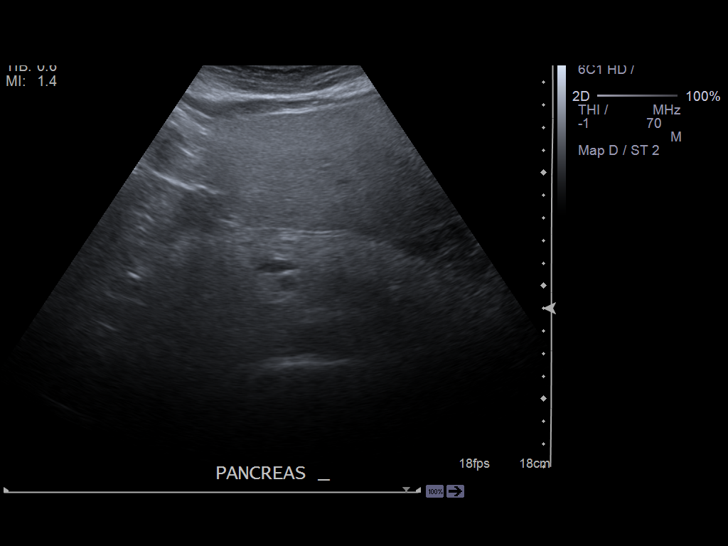
[im 6/69]
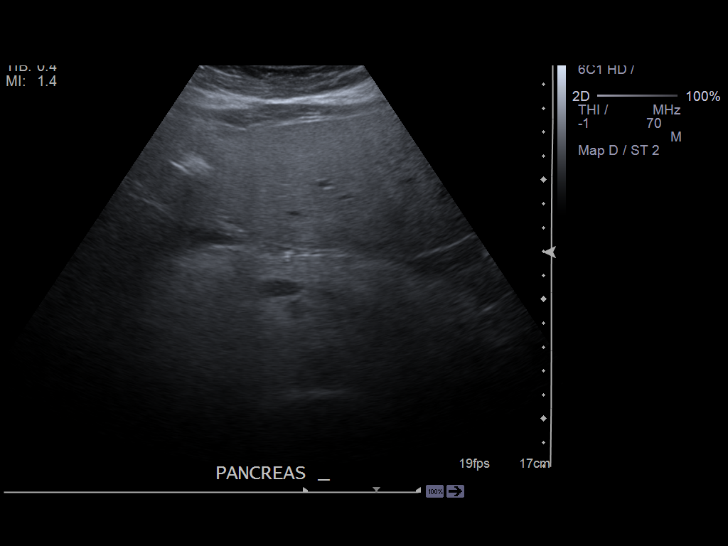
[im 12/69]
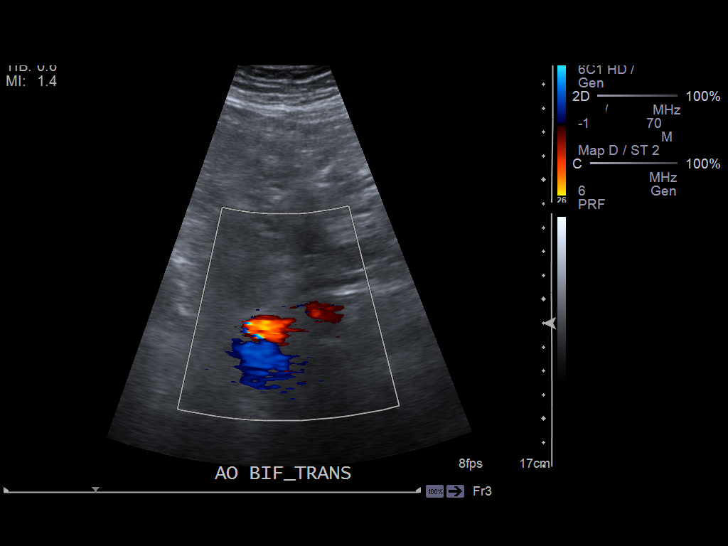
[im 18/69]
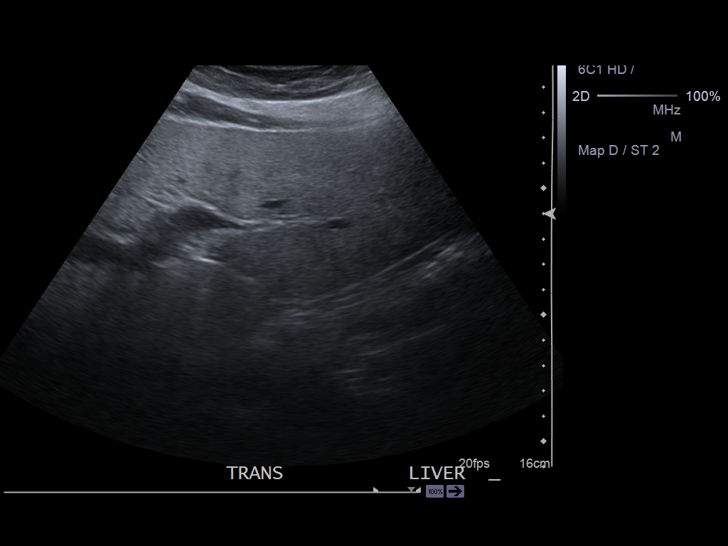
[im 23/69]
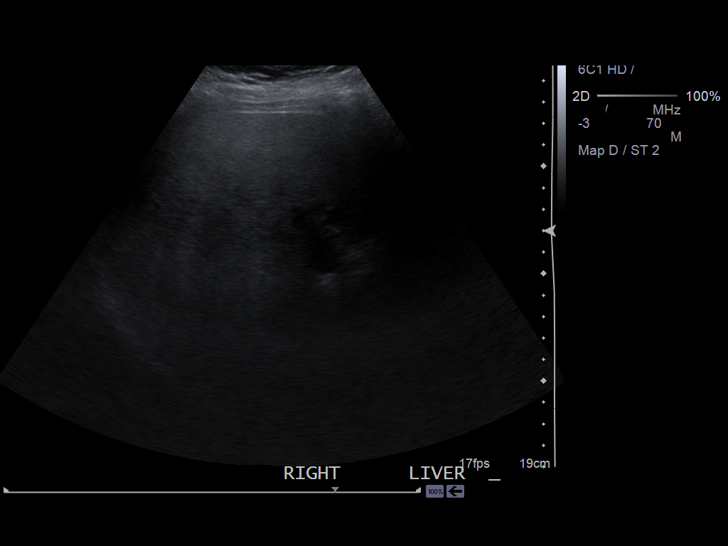
[im 26/69]
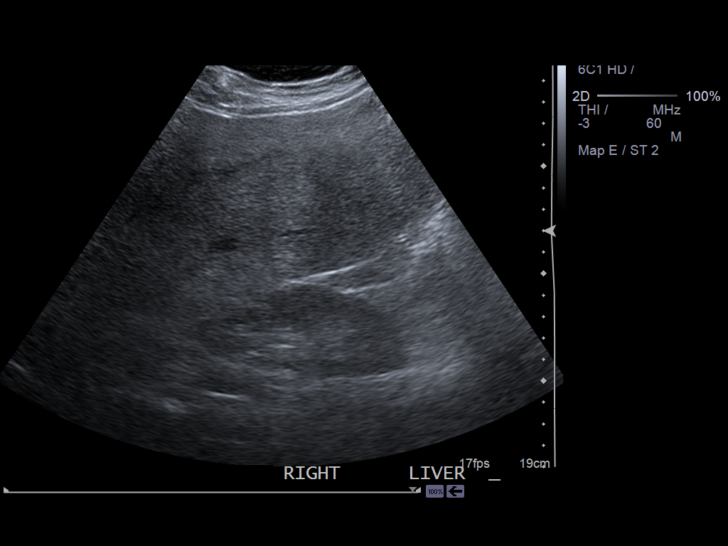
[im 32/69]
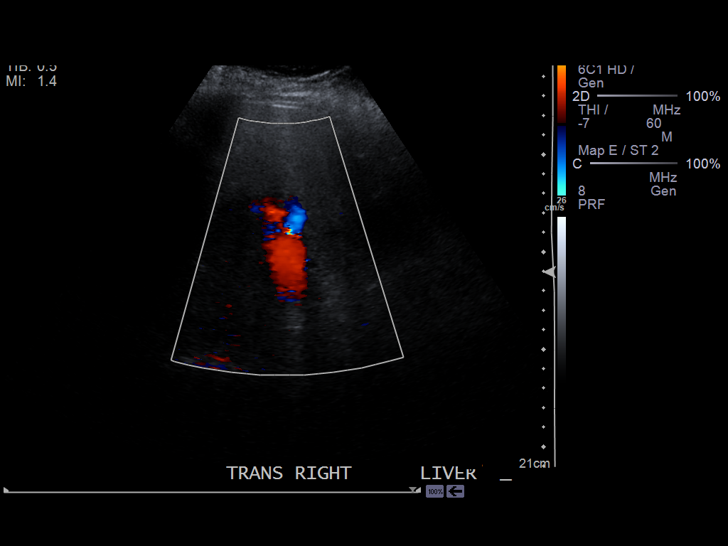
[im 37/69]
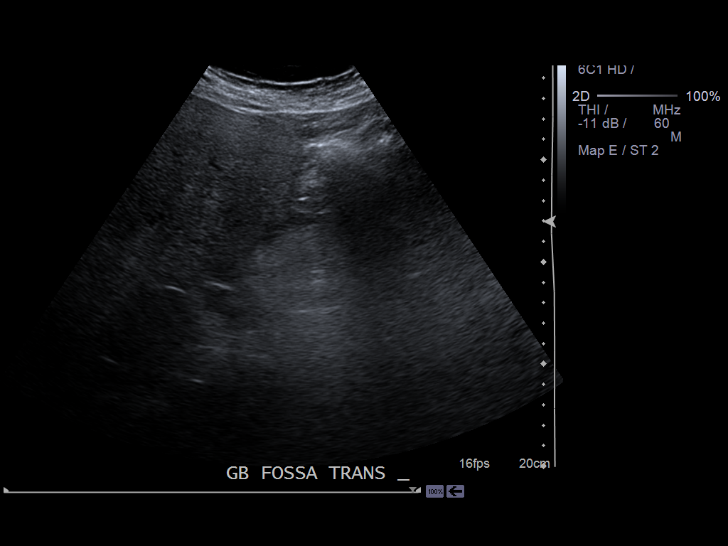
[im 43/69]
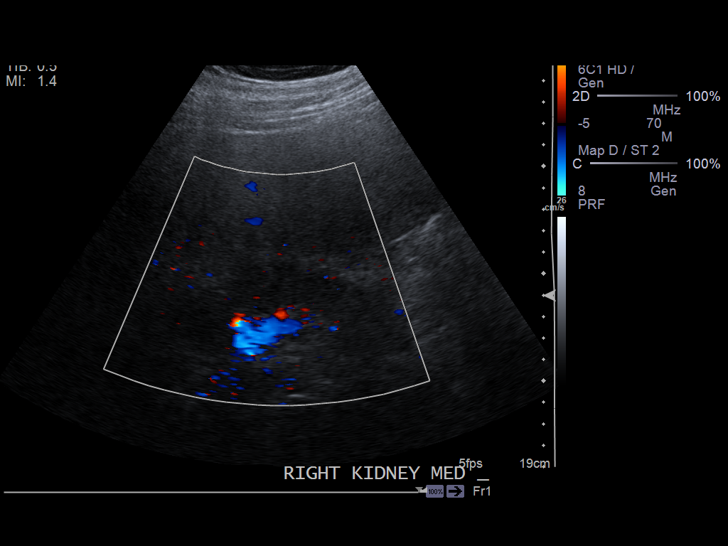
[im 46/69]
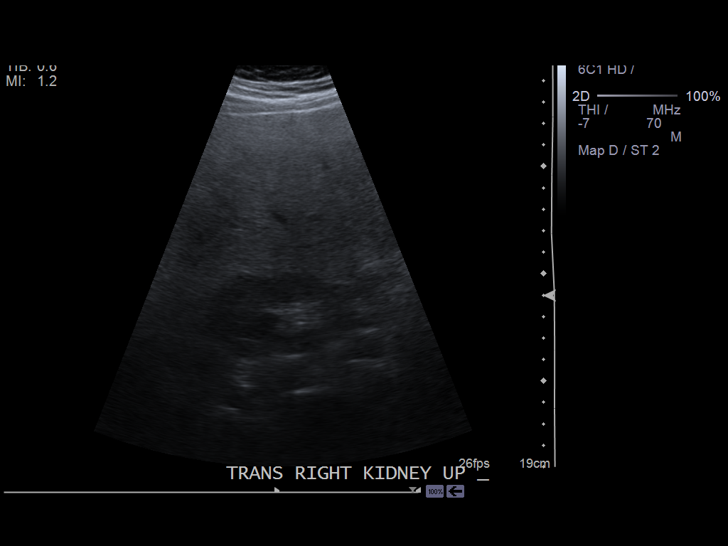
[im 52/69]
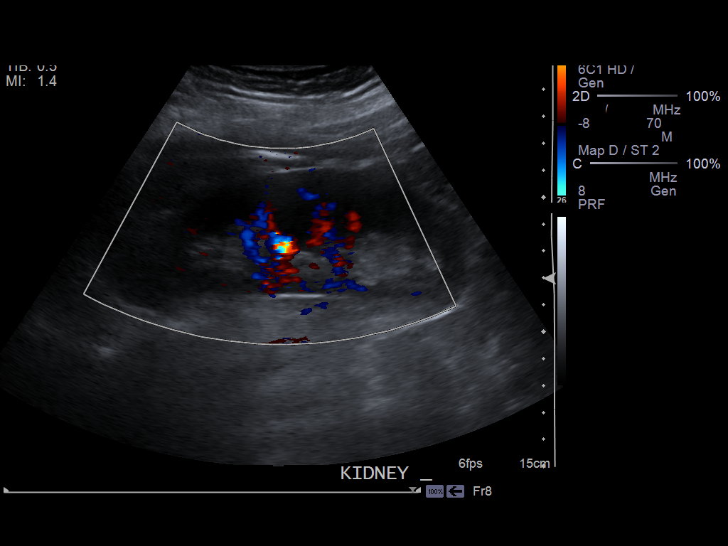
[im 57/69]
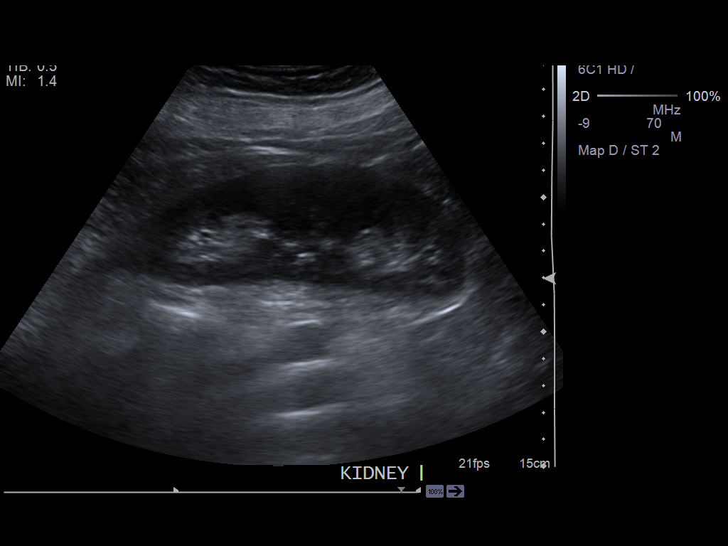
[im 63/69]
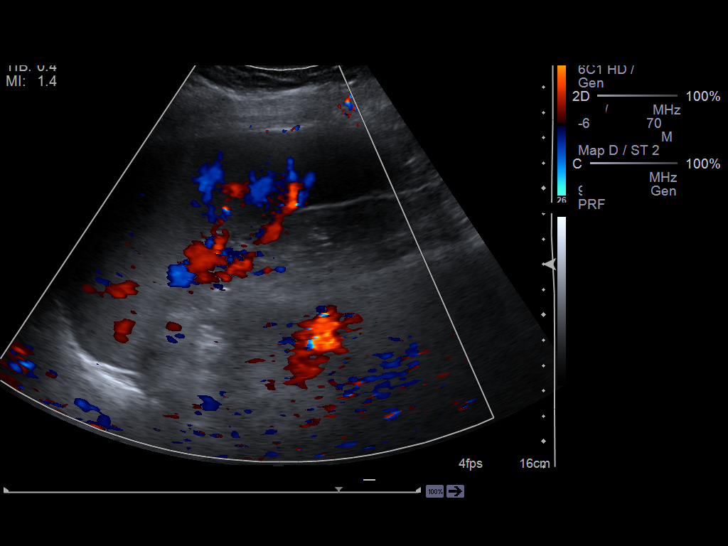
[im 69/69]
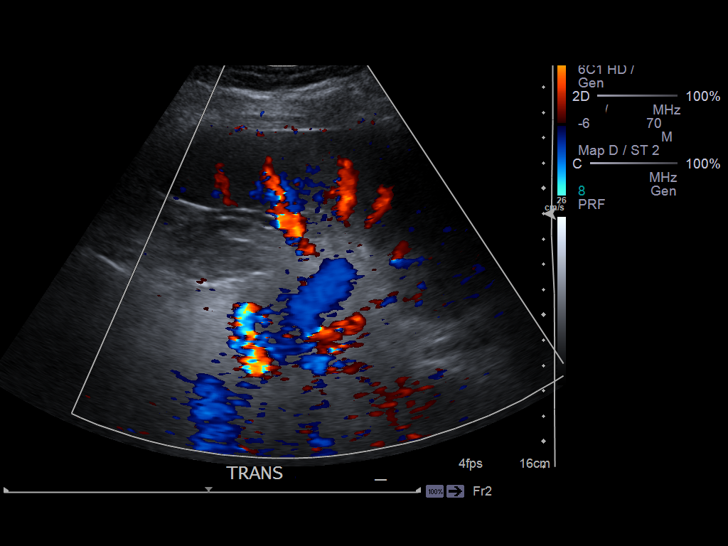

[14 of 25 positions shown; findings below may reference images not displayed]

FINDINGS: Gallbladder

Status post cholecystectomy

Common bile duct

Diameter: 7.7 mm, stable from the prior ultrasound. No evidence of a
duct stone.

Liver

Liver is enlarged and echogenic. No liver mass or focal lesion.
Hepatopetal flow was documented in the portal vein.

IVC

No abnormality visualized.

Pancreas

Visualized portion unremarkable.

Spleen

Prominent but not enlarged. Volume is treated in 25 mm. No splenic
mass or focal lesion.

Right Kidney

Length: 12.3 cm. Echogenicity within normal limits. No mass or
hydronephrosis visualized.

Left Kidney

Length: 12.3 Cm.. Echogenicity within normal limits. No mass or
hydronephrosis visualized.

Abdominal aorta

No aneurysm visualized.
IMPRESSION: 1. No acute finding.
2. Hepatomegaly with extensive hepatic steatosis. No liver mass or
focal lesion.
3. Status post cholecystectomy. Mild chronic dilation of the common
bile duct.

## 2015-07-14 ENCOUNTER — Other Ambulatory Visit: Payer: Self-pay | Admitting: Family Medicine

## 2015-07-22 ENCOUNTER — Telehealth: Payer: Self-pay

## 2015-07-22 NOTE — Telephone Encounter (Signed)
Brought in sugar readings. Left message that these are ok and keep appt

## 2015-08-19 ENCOUNTER — Other Ambulatory Visit: Payer: Self-pay | Admitting: Family Medicine

## 2015-09-10 ENCOUNTER — Encounter: Payer: Self-pay | Admitting: Psychiatry

## 2015-09-10 ENCOUNTER — Ambulatory Visit (INDEPENDENT_AMBULATORY_CARE_PROVIDER_SITE_OTHER): Payer: 59 | Admitting: Psychiatry

## 2015-09-10 VITALS — BP 118/68 | HR 8 | Temp 97.4°F | Wt 226.8 lb

## 2015-09-10 DIAGNOSIS — F251 Schizoaffective disorder, depressive type: Secondary | ICD-10-CM | POA: Diagnosis not present

## 2015-09-10 MED ORDER — VILAZODONE HCL 40 MG PO TABS: 40.0000 mg | ORAL_TABLET | Freq: Every morning | ORAL | Status: DC

## 2015-09-10 MED ORDER — ZIPRASIDONE HCL 60 MG PO CAPS: 60.0000 mg | ORAL_CAPSULE | Freq: Three times a day (TID) | ORAL | Status: DC

## 2015-09-10 MED ORDER — TRAZODONE HCL 100 MG PO TABS: 100.0000 mg | ORAL_TABLET | Freq: Every day | ORAL | Status: DC

## 2015-09-10 MED ORDER — DIVALPROEX SODIUM 500 MG PO DR TAB: 500.0000 mg | DELAYED_RELEASE_TABLET | ORAL | Status: DC

## 2015-09-10 NOTE — Progress Notes (Signed)
Psychiatric Follow up MD/NP Note   Patient Identification: Tabitha [name] MRN:  [id number] Date of Evaluation:  09/10/2015 Referral Source: [facility].  Chief Complaint:   Chief Complaint    Follow-up; Medication Refill; Anxiety     Visit Diagnosis:    ICD-9-CM ICD-10-CM   1. Schizoaffective disorder, depressive type (HCC) 295.70 F25.1    Diagnosis:   Patient Active Problem List   Diagnosis Date Noted  . DM (diabetes mellitus) (HCC) [E11.9] 12/03/2014  . HBP (high blood pressure) [I10] 12/03/2014  . Hyperlipidemia [E78.5] 12/03/2014  . Bipolar disorder in partial remission (HCC) [F31.70] 12/03/2014  . GERD without esophagitis [K21.9] 12/03/2014   History of Present Illness:    Patient is a 45 year-old married female who presented for follow-up.Patient reported that she Missed her appointment and was not compliant with her medications. She is planning to relocate to [place] so she can be closer to her mother. 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 She reported that she does not know about the rules of home schooling and has to find out about everything. She reported that since she ran out of her medications she started becoming more paranoid and was thinking about people talking about her. She reported that she recently started back on her medications as they were refill. She appeared anxious and apprehensive during the interview. She reported that she wants to have her medications adjusted. She currently denied having any suicidal ideations or plans. She stated that she is going to [place] due to her father's per day and will look around and see if houses over there. Her family remains supportive. She denied having any perceptual disturbances at this time.   Past Medical History:  Past Medical History  Diagnosis Date  . Diabetes mellitus without complication (HCC)   . Mild sleep apnea   . Asthma    . GERD (gastroesophageal reflux disease)   . Depression   . Diabetes mellitus, type II [facility])     Past Surgical History  Procedure Laterality Date  . Cholecystectomy  2006  . Cesarean section  2006/2008   Family History:  Family History  Problem Relation Age of Onset  . Diabetes Father   . Hyperlipidemia Father   . Diabetes Brother   . Lupus Mother    Social History:   Social History   Social History  . Marital Status: Married    Spouse Name: N/A  . Number of Children: N/A  . Years of Education: N/A   Social History Main Topics  . Smoking status: Never Smoker   . Smokeless tobacco: Never Used  . Alcohol Use: No  . Drug Use: No  . Sexual Activity: Yes    Birth Control/ Protection: None   Other Topics Concern  . None   Social History Narrative   Additional Social History:  Married x 11 years. Has a good relationship with husband and has a 45 and 28 yo. 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 She stated that she does not smoke cigarettes and does not use any drugs or alcohol   Musculoskeletal: Strength & Muscle Tone: within normal limits Gait & Station: normal Patient leans: N/A  Psychiatric Specialty Exam: Anxiety Symptoms include insomnia and nervous/anxious behavior.      Review of Systems  Constitutional: Positive for malaise/fatigue. Negative for weight loss.  HENT: Negative for congestion and hearing loss.   Eyes: Negative for  photophobia.  Respiratory: Negative for cough and sputum production.   Genitourinary: Negative for urgency.  Psychiatric/Behavioral: Positive for depression and hallucinations. The patient is nervous/anxious and has insomnia.   All other systems reviewed and are negative.   Blood pressure 118/68, pulse 8, temperature 97.4 F (36.3 C), temperature source Tympanic, weight 226 lb 12.8 oz (102.876 kg), last menstrual period 08/03/2015, SpO2 95 %.Body mass index is 40.8 kg/(m^2).  General Appearance: Casual and  Fairly Groomed  Eye Contact:  Fair  Speech:  Clear and Coherent  Volume:  Normal  Mood:  Anxious and Depressed  Affect:  Congruent  Thought Process:  Coherent  Orientation:  Full (Time, Place, and Person)  Thought Content:  Obsessions and Paranoid Ideation  Suicidal Thoughts:  No  Homicidal Thoughts:  No  Memory:  Immediate;   Fair  Judgement:  Fair  Insight:  Fair  Psychomotor Activity:  Decreased  Concentration:  Fair  Recall:  [employer] of Knowledge:Fair  Language: Fair  Akathisia:  No  Handed:  Right  AIMS (if indicated):  none  Assets:  Communication Skills Desire for Improvement Housing Intimacy Physical Health Social Support  ADL's:  Intact  Cognition: WNL  Sleep:  6-7    Is the patient at risk to self?  No. Has the patient been a risk to self in the past 6 months?  No. Has the patient been a risk to self within the distant past?  No. Is the patient a risk to others?  No. Has the patient been a risk to others in the past 6 months?  No. Has the patient been a risk to others within the distant past?  No.  Allergies:   Allergies  Allergen Reactions  . Abilify [Aripiprazole] Hives   Current Medications: Current Outpatient Prescriptions  Medication Sig Dispense Refill  . ACCU-CHEK AVIVA PLUS test strip TEST BLOOD SUGAR ONCE DAILY 100 each 12  . albuterol (PROVENTIL HFA;VENTOLIN HFA) 108 (90 Base) MCG/ACT inhaler Inhale 2 puffs into the lungs every 6 (six) hours as needed for wheezing or shortness of breath. 1 Inhaler 2  . ALPRAZolam (XANAX) 0.25 MG tablet Take 1 tablet (0.25 mg total) by mouth at bedtime as needed for anxiety. 30 tablet 1  . atorvastatin (LIPITOR) 20 MG tablet TAKE 1 TABLET BY MOUTH EVERY NIGHT AT BEDTIME 30 tablet 12  . divalproex (DEPAKOTE) 500 MG DR tablet Take 1 tablet (500 mg total) by mouth 1 day or 1 dose. 90 tablet 1  . doxycycline (VIBRAMYCIN) 50 MG capsule   0  . Insulin Detemir (LEVEMIR) 100 UNIT/ML Pen Inject 38 Units into the skin  2 (two) times daily. 15 mL 11  . Insulin Pen Needle (BD PEN NEEDLE NANO U/F) 32G X 4 MM MISC 1 each by Does not apply route daily. 100 each 5  . lisinopril (PRINIVIL,ZESTRIL) 5 MG tablet TAKE 1 TABLET BY MOUTH EVERY DAY 90 tablet 3  . metformin (FORTAMET) 1000 MG (OSM) 24 hr tablet Take 1,000 mg by mouth 2 (two) times daily.  12  . NOVOTWIST 32G X 5 MM MISC USE WITH LEVEMIR PEN BID  5  . omeprazole (PRILOSEC) 10 MG capsule TAKE 1 CAPSULE BY MOUTH EVERY DAY 30 capsule 12  . pioglitazone (ACTOS) 45 MG tablet TAKE 1 TABLET BY MOUTH EVERY DAY 30 tablet 12  . traZODone (DESYREL) 100 MG tablet Take 1 tablet (100 mg total) by mouth at bedtime. 90 tablet 1  . Vilazodone HCl (VIIBRYD) 40 MG  TABS Take 1 tablet (40 mg total) by mouth every morning. 90 tablet 1  . ziprasidone (GEODON) 60 MG capsule Take 1 capsule (60 mg total) by mouth 3 (three) times daily. 90 capsule 1   No current facility-administered medications for this visit.    Previous Psychotropic Medications:  Patient reported that she has previously tried Risperdal and Seroquel and Zyprexa Paxil and Celexa. She reported that she has history of adverse reaction to Abilify when she had hives. She stated that she has never tried Depakote or lithium and Tegretol. She denied any history of suicide attempts in the past.  Substance Abuse History in the last 12 months:  No.  Consequences of Substance Abuse: Negative NA  Medical Decision Making:  Review of Psycho-Social Stressors (1) and Established Problem, Worsening (2)  Treatment Plan Summary: Medication management   Paranoia Continue  Geodon 60 mg by mouth TID  Continue  Depakote 500 mg by mouth daily at bedtime. She has enough supply of benztropine at home.   Insomnia Continue  trazodone 100 mg by mouth daily at bedtime when necessary for insomnia  Anxiety and depression Continue  Viibryd 40 mg daily  She was given the lab requisition form so she can have her baseline labs  including CBC with differential, CMP TSH prolactin level and lipid profile done   Discussed with her about the medications in detail and she demonstrated understanding Advised her that she needed to come back to the emergency room if she notices worsening of her symptoms  and she agreed with the plan.   More than 50% of the time spent in psychoeducation, counseling and coordination of care.  Time spent with the patient 25 minutes Follow-up in 1 months.    This note was generated in part or whole with voice recognition software. Voice regonition is usually quite accurate but there are transcription errors that can and very often do occur. I apologize for any typographical errors that were not detected and corrected.    Brandy HaleUzma Fantasha Daniele, MD    5/16/20174:01 PM

## 2015-09-11 ENCOUNTER — Other Ambulatory Visit: Payer: Self-pay | Admitting: Psychiatry

## 2015-09-24 ENCOUNTER — Ambulatory Visit (INDEPENDENT_AMBULATORY_CARE_PROVIDER_SITE_OTHER): Payer: Medicare Other | Admitting: Family Medicine

## 2015-09-24 ENCOUNTER — Encounter: Payer: Self-pay | Admitting: Family Medicine

## 2015-09-24 VITALS — BP 94/62 | HR 80 | Temp 98.0°F | Resp 16 | Ht 62.5 in | Wt 226.0 lb

## 2015-09-24 DIAGNOSIS — E785 Hyperlipidemia, unspecified: Secondary | ICD-10-CM | POA: Diagnosis not present

## 2015-09-24 DIAGNOSIS — E669 Obesity, unspecified: Secondary | ICD-10-CM | POA: Diagnosis not present

## 2015-09-24 DIAGNOSIS — F3175 Bipolar disorder, in partial remission, most recent episode depressed: Secondary | ICD-10-CM | POA: Diagnosis not present

## 2015-09-24 DIAGNOSIS — I10 Essential (primary) hypertension: Secondary | ICD-10-CM

## 2015-09-24 DIAGNOSIS — E119 Type 2 diabetes mellitus without complications: Secondary | ICD-10-CM | POA: Diagnosis not present

## 2015-09-24 LAB — POCT GLYCOSYLATED HEMOGLOBIN (HGB A1C)

## 2015-09-24 MED ORDER — LISINOPRIL 2.5 MG PO TABS: 2.5000 mg | ORAL_TABLET | Freq: Every day | ORAL | Status: DC

## 2015-09-24 NOTE — Progress Notes (Signed)
Name: Tabitha [name]   MRN: [id number]    DOB: Feb 07, 1971   Date:09/24/2015       Progress Note  Subjective  Chief Complaint  Chief Complaint  Patient presents with  . Diabetes    HPI Here for f/u of DM.  BSs still ranging from 120s-210s, but her A1c is down to 6.5 today.  She has not lost any weight. She is taking all meds and is feeling well.  Cont. To see her Psych.    No problem-specific assessment & plan notes found for this encounter.   Past Medical History  Diagnosis Date  . Diabetes mellitus without complication (HCC)   . Mild sleep apnea   . Asthma   . GERD (gastroesophageal reflux disease)   . Depression   . Diabetes mellitus, type II [facility])     Past Surgical History  Procedure Laterality Date  . Cholecystectomy  2006  . Cesarean section  2006/2008    Family History  Problem Relation Age of Onset  . Diabetes Father   . Hyperlipidemia Father   . Diabetes Brother   . Lupus Mother     Social History   Social History  . Marital Status: Married    Spouse Name: N/A  . Number of Children: N/A  . Years of Education: N/A   Occupational History  . Not on file.   Social History Main Topics  . Smoking status: Never Smoker   . Smokeless tobacco: Never Used  . Alcohol Use: No  . Drug Use: No  . Sexual Activity: Yes    Birth Control/ Protection: None   Other Topics Concern  . Not on file   Social History Narrative     Current outpatient prescriptions:  .  ACCU-CHEK AVIVA PLUS test strip, TEST BLOOD SUGAR ONCE DAILY, Disp: 100 each, Rfl: 12 .  albuterol (PROVENTIL HFA;VENTOLIN HFA) 108 (90 Base) MCG/ACT inhaler, Inhale 2 puffs into the lungs every 6 (six) hours as needed for wheezing or shortness of breath., Disp: 1 Inhaler, Rfl: 2 .  atorvastatin (LIPITOR) 20 MG tablet, TAKE 1 TABLET BY MOUTH EVERY NIGHT AT BEDTIME, Disp: 30 tablet, Rfl: 12 .  divalproex (DEPAKOTE) 500 MG DR tablet, Take 1 tablet (500 mg total) by mouth 1 day or 1 dose., Disp: 90  tablet, Rfl: 1 .  doxycycline (VIBRAMYCIN) 50 MG capsule, , Disp: , Rfl: 0 .  Insulin Detemir (LEVEMIR) 100 UNIT/ML Pen, Inject 38 Units into the skin 2 (two) times daily., Disp: 15 mL, Rfl: 11 .  Insulin Pen Needle (BD PEN NEEDLE NANO U/F) 32G X 4 MM MISC, 1 each by Does not apply route daily., Disp: 100 each, Rfl: 5 .  lisinopril (PRINIVIL,ZESTRIL) 2.5 MG tablet, Take 1 tablet (2.5 mg total) by mouth daily., Disp: 30 tablet, Rfl: 12 .  metformin (FORTAMET) 1000 MG (OSM) 24 hr tablet, Take 1,000 mg by mouth 2 (two) times daily., Disp: , Rfl: 12 .  NOVOTWIST 32G X 5 MM MISC, USE WITH LEVEMIR PEN BID, Disp: , Rfl: 5 .  omeprazole (PRILOSEC) 10 MG capsule, TAKE 1 CAPSULE BY MOUTH EVERY DAY, Disp: 30 capsule, Rfl: 12 .  pioglitazone (ACTOS) 45 MG tablet, TAKE 1 TABLET BY MOUTH EVERY DAY, Disp: 30 tablet, Rfl: 12 .  traZODone (DESYREL) 100 MG tablet, Take 1 tablet (100 mg total) by mouth at bedtime., Disp: 90 tablet, Rfl: 1 .  Vilazodone HCl (VIIBRYD) 40 MG TABS, Take 1 tablet (40 mg total) by mouth every morning.,  Disp: 90 tablet, Rfl: 1 .  ziprasidone (GEODON) 60 MG capsule, Take 1 capsule (60 mg total) by mouth 3 (three) times daily., Disp: 90 capsule, Rfl: 1  Allergies  Allergen Reactions  . Abilify [Aripiprazole] Hives     Review of Systems  Constitutional: Negative for fever, chills, weight loss and malaise/fatigue.  HENT: Negative for hearing loss.   Eyes: Negative for blurred vision and double vision.  Respiratory: Negative for cough, shortness of breath and wheezing.   Cardiovascular: Negative for chest pain, palpitations and leg swelling.  Gastrointestinal: Negative for heartburn, abdominal pain and blood in stool.  Genitourinary: Negative for dysuria, urgency and frequency.  Musculoskeletal: Negative for myalgias and joint pain.  Skin: Negative for rash.  Neurological: Negative for dizziness, tremors, weakness and headaches.  Psychiatric/Behavioral: Negative for depression. The  patient is not nervous/anxious.       Objective  Filed Vitals:   09/24/15 1308  BP: 94/62  Pulse: 80  Temp: 98 F (36.7 C)  TempSrc: Oral  Resp: 16  Height: 5' 2.5" (1.588 m)  Weight: 226 lb (102.513 kg)    Physical Exam  Constitutional: She is oriented to person, place, and time and well-developed, well-nourished, and in no distress. No distress.  HENT:  Head: Normocephalic and atraumatic.  Eyes: Conjunctivae and EOM are normal. Pupils are equal, round, and reactive to light. No scleral icterus.  Neck: Normal range of motion. Neck supple. Carotid bruit is not present. No thyromegaly present.  Cardiovascular: Normal rate, regular rhythm and normal heart sounds.  Exam reveals no gallop and no friction rub.   No murmur heard. Pulmonary/Chest: Effort normal and breath sounds normal. No respiratory distress. She has no wheezes. She has no rales.  Abdominal: Soft. Bowel sounds are normal. She exhibits no distension, no abdominal bruit and no mass. There is no tenderness.  Musculoskeletal: She exhibits no edema.  Lymphadenopathy:    She has no cervical adenopathy.  Neurological: She is alert and oriented to person, place, and time.  Psychiatric: Mood, memory, affect and judgment normal.  Vitals reviewed.      Recent Results (from the past 2160 hour(s))  POCT HgB A1C     Status: Normal   Collection Time: 09/24/15  1:12 PM  Result Value Ref Range   Hemoglobin A1C 6.5 %      Assessment & Plan  Problem List Items Addressed This Visit      Cardiovascular and Mediastinum   HBP (high blood pressure)   Relevant Medications   lisinopril (PRINIVIL,ZESTRIL) 2.5 MG tablet   Other Relevant Orders   CBC with Differential     Endocrine   DM (diabetes mellitus) (HCC) - Primary   Relevant Medications   lisinopril (PRINIVIL,ZESTRIL) 2.5 MG tablet   Other Relevant Orders   POCT HgB A1C (Completed)   Comprehensive Metabolic Panel (CMET)     Other   Hyperlipidemia   Relevant  Medications   lisinopril (PRINIVIL,ZESTRIL) 2.5 MG tablet   Other Relevant Orders   Lipid Profile   Bipolar disorder in partial remission (HCC)   Obesity   Relevant Orders   TSH      Meds ordered this encounter  Medications  . lisinopril (PRINIVIL,ZESTRIL) 2.5 MG tablet    Sig: Take 1 tablet (2.5 mg total) by mouth daily.    Dispense:  30 tablet    Refill:  12    **Patient requests 90 days supply**   1. Type 2 diabetes mellitus without complication, without long-term current use  of insulin (HCC) Cont Levemir, Actos and Metformin - POCT HgB A1C-6.5 - Comprehensive Metabolic Panel (CMET)  2. Essential hypertension  - lisinopril (PRINIVIL,ZESTRIL) 2.5 MG tablet; Take 1 tablet (2.5 mg total) by mouth daily.  Dispense: 30 tablet; Refill: 12 - CBC with Differential  3. Bipolar disorder, in partial remission, most recent episode depressed (HCC) Cont to see and follow directions of Psych.  4. Hyperlipidemia  - Lipid Profile  5. Obesity Discussed dietary changes ansd weight losss. - TSH

## 2015-09-26 ENCOUNTER — Other Ambulatory Visit: Payer: Self-pay

## 2015-09-26 ENCOUNTER — Other Ambulatory Visit: Payer: Self-pay | Admitting: Family Medicine

## 2015-09-26 DIAGNOSIS — E785 Hyperlipidemia, unspecified: Secondary | ICD-10-CM | POA: Diagnosis not present

## 2015-09-26 DIAGNOSIS — E119 Type 2 diabetes mellitus without complications: Secondary | ICD-10-CM | POA: Diagnosis not present

## 2015-09-26 DIAGNOSIS — I10 Essential (primary) hypertension: Secondary | ICD-10-CM | POA: Diagnosis not present

## 2015-09-26 LAB — COMPREHENSIVE METABOLIC PANEL
ALBUMIN: 4.1 g/dL (ref 3.6–5.1)
ALT: 12 U/L (ref 6–29)
AST: 11 U/L (ref 10–30)
Alkaline Phosphatase: 65 U/L (ref 33–115)
BUN: 5 mg/dL — ABNORMAL LOW (ref 7–25)
CALCIUM: 9.1 mg/dL (ref 8.6–10.2)
CHLORIDE: 99 mmol/L (ref 98–110)
CO2: 23 mmol/L (ref 20–31)
CREATININE: 0.73 mg/dL (ref 0.50–1.10)
Glucose, Bld: 162 mg/dL — ABNORMAL HIGH (ref 65–99)
POTASSIUM: 3.6 mmol/L (ref 3.5–5.3)
SODIUM: 138 mmol/L (ref 135–146)
TOTAL PROTEIN: 6.2 g/dL (ref 6.1–8.1)
Total Bilirubin: 0.5 mg/dL (ref 0.2–1.2)

## 2015-09-26 LAB — LIPID PANEL
CHOL/HDL RATIO: 2.9 ratio (ref <=5.0)
CHOLESTEROL: 138 mg/dL (ref 125–200)
HDL: 48 mg/dL (ref >=46)
LDL CALC: 31 mg/dL (ref <130)
TRIGLYCERIDES: 295 mg/dL — AB (ref <150)
VLDL: 59 mg/dL — AB (ref <30)

## 2015-09-26 LAB — CBC WITH DIFFERENTIAL/PLATELET
BASOS ABS: 54 {cells}/uL (ref 0–200)
Basophils Relative: 1 %
EOS ABS: 108 {cells}/uL (ref 15–500)
EOS PCT: 2 %
HEMATOCRIT: 34.9 % — AB (ref 35.0–45.0)
HEMOGLOBIN: 11.6 g/dL — AB (ref 11.7–15.5)
LYMPHS ABS: 2106 {cells}/uL (ref 850–3900)
Lymphocytes Relative: 39 %
MCH: 28.7 pg (ref 27.0–33.0)
MCHC: 33.2 g/dL (ref 32.0–36.0)
MCV: 86.4 fL (ref 80.0–100.0)
MPV: 10.4 fL (ref 7.5–12.5)
Monocytes Absolute: 378 cells/uL (ref 200–950)
Monocytes Relative: 7 %
NEUTROS ABS: 2754 {cells}/uL (ref 1500–7800)
NEUTROS PCT: 51 %
Platelets: 174 10*3/uL (ref 140–400)
RBC: 4.04 MIL/uL (ref 3.80–5.10)
RDW: 15.8 % — ABNORMAL HIGH (ref 11.0–15.0)
WBC: 5.4 10*3/uL (ref 3.8–10.8)

## 2015-09-26 LAB — PROLACTIN: Prolactin: 37.1 ng/mL — ABNORMAL HIGH

## 2015-09-26 LAB — TSH: TSH: 2.28 mIU/L

## 2015-09-30 ENCOUNTER — Telehealth: Payer: Self-pay | Admitting: Psychiatry

## 2015-09-30 NOTE — Telephone Encounter (Signed)
Labs Reviewed Prolactin 37.1 Glucose162 Chol-295 TSH- WNL  Will discuss at her appointment Next week.

## 2015-10-10 ENCOUNTER — Ambulatory Visit: Payer: Medicare Other | Admitting: Psychiatry

## 2015-10-15 ENCOUNTER — Encounter: Payer: Self-pay | Admitting: Psychiatry

## 2015-10-15 ENCOUNTER — Ambulatory Visit (INDEPENDENT_AMBULATORY_CARE_PROVIDER_SITE_OTHER): Payer: 59 | Admitting: Psychiatry

## 2015-10-15 ENCOUNTER — Other Ambulatory Visit: Payer: Self-pay | Admitting: Psychiatry

## 2015-10-15 VITALS — BP 122/84 | HR 94 | Temp 97.5°F | Ht 62.5 in | Wt 230.2 lb

## 2015-10-15 DIAGNOSIS — F251 Schizoaffective disorder, depressive type: Secondary | ICD-10-CM | POA: Diagnosis not present

## 2015-10-15 DIAGNOSIS — F429 Obsessive-compulsive disorder, unspecified: Secondary | ICD-10-CM | POA: Diagnosis not present

## 2015-10-15 MED ORDER — ZIPRASIDONE HCL 60 MG PO CAPS: 60.0000 mg | ORAL_CAPSULE | Freq: Three times a day (TID) | ORAL | Status: DC

## 2015-10-15 MED ORDER — VILAZODONE HCL 40 MG PO TABS: 40.0000 mg | ORAL_TABLET | Freq: Every morning | ORAL | Status: DC

## 2015-10-15 MED ORDER — DIVALPROEX SODIUM 250 MG PO DR TAB: 250.0000 mg | DELAYED_RELEASE_TABLET | Freq: Every morning | ORAL | Status: DC

## 2015-10-15 MED ORDER — DIVALPROEX SODIUM 500 MG PO DR TAB: 500.0000 mg | DELAYED_RELEASE_TABLET | ORAL | Status: DC

## 2015-10-15 MED ORDER — TRAZODONE HCL 100 MG PO TABS: 100.0000 mg | ORAL_TABLET | Freq: Every day | ORAL | Status: DC

## 2015-10-15 NOTE — Progress Notes (Signed)
Psychiatric Follow up MD/NP Note   Patient Identification: Tabitha [name] MRN:  [id number] Date of Evaluation:  10/15/2015 Referral Source: [facility].  Chief Complaint:   Chief Complaint    Follow-up; Medication Refill     Visit Diagnosis:    ICD-9-CM ICD-10-CM   1. Schizoaffective disorder, depressive type (HCC) 295.70 F25.1   2. OCD (obsessive compulsive disorder) 300.3 F42.9    Diagnosis:   Patient Active Problem List   Diagnosis Date Noted  . Obesity [E66.9] 09/24/2015  . DM (diabetes mellitus) (HCC) [E11.9] 12/03/2014  . HBP (high blood pressure) [I10] 12/03/2014  . Hyperlipidemia [E78.5] 12/03/2014  . Bipolar disorder in partial remission (HCC) [F31.70] 12/03/2014  . GERD without esophagitis [K21.9] 12/03/2014   History of Present Illness:    Patient is a 45 year-old married female who presented for follow-up.Patient reported that she Is becoming more anxious and worried as she has been asking for forgiveness from Jesus and does not know if Jesus is forgiving her. She reported that she prays most of the time. Patient reported that they are not planning to move to [place] but will buy an RV and will be traveling from one place to the other. She is excited about the same. She reported that she has been packing  her house as her husband was to be living in the RV. Patient reported that her kids are also happy about this plan. Patient reported that she prays most of the time and is asking for forgiveness. She is interested in going higher on the dose of Depakote at this time. We discussed about her lab work and everything is within normal limits except for slightly elevated prolactin level. She reported that she is not experiencing any side effects at this time. She appeared calm and collected during the interview. She currently denied having any suicidal ideations or plans. Her family remains supportive. She denied having any perceptual disturbances at  this time.   Past Medical History:  Past Medical History  Diagnosis Date  . Diabetes mellitus without complication (HCC)   . Mild sleep apnea   . Asthma   . GERD (gastroesophageal reflux disease)   . Depression   . Diabetes mellitus, type II [facility])     Past Surgical History  Procedure Laterality Date  . Cholecystectomy  2006  . Cesarean section  2006/2008   Family History:  Family History  Problem Relation Age of Onset  . Diabetes Father   . Hyperlipidemia Father   . Diabetes Brother   . Lupus Mother    Social History:   Social History   Social History  . Marital Status: Married    Spouse Name: N/A  . Number of Children: N/A  . Years of Education: N/A   Social History Main Topics  . Smoking status: Never Smoker   . Smokeless tobacco: Never Used  . Alcohol Use: No  . Drug Use: No  . Sexual Activity: Yes    Birth Control/ Protection: None   Other Topics Concern  . None   Social History Narrative   Additional Social History:  Married x 11 years. Has a good relationship with husband and has a 71 and 75 yo. 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 She stated that she does not smoke cigarettes and does not use any drugs or alcohol   Musculoskeletal: Strength & Muscle Tone: within normal limits Gait & Station: normal Patient leans: N/A  Psychiatric Specialty Exam: Anxiety  Symptoms include insomnia and nervous/anxious behavior.      Review of Systems  Constitutional: Positive for malaise/fatigue. Negative for weight loss.  HENT: Negative for congestion and hearing loss.   Eyes: Negative for photophobia.  Respiratory: Negative for cough and sputum production.   Genitourinary: Negative for urgency.  Psychiatric/Behavioral: Positive for depression and hallucinations. The patient is nervous/anxious and has insomnia.   All other systems reviewed and are negative.   Blood pressure 122/84, pulse 94, temperature 97.5 F (36.4 C), temperature  source Tympanic, height 5' 2.5" (1.588 m), weight 230 lb 3.2 oz (104.418 kg), SpO2 96 %.Body mass index is 41.41 kg/(m^2).  General Appearance: Casual and Fairly Groomed  Eye Contact:  Fair  Speech:  Clear and Coherent  Volume:  Normal  Mood:  Anxious and Depressed  Affect:  Congruent  Thought Process:  Coherent  Orientation:  Full (Time, Place, and Person)  Thought Content:  Obsessions and Paranoid Ideation  Suicidal Thoughts:  No  Homicidal Thoughts:  No  Memory:  Immediate;   Fair  Judgement:  Fair  Insight:  Fair  Psychomotor Activity:  Decreased  Concentration:  Fair  Recall:  [employer]  Fund of Knowledge:Fair  Language: Fair  Akathisia:  No  Handed:  Right  AIMS (if indicated):  none  Assets:  Communication Skills Desire for Improvement Housing Intimacy Physical Health Social Support  ADL's:  Intact  Cognition: WNL  Sleep:  6-7    Is the patient at risk to self?  No. Has the patient been a risk to self in the past 6 months?  No. Has the patient been a risk to self within the distant past?  No. Is the patient a risk to others?  No. Has the patient been a risk to others in the past 6 months?  No. Has the patient been a risk to others within the distant past?  No.  Allergies:   Allergies  Allergen Reactions  . Abilify [Aripiprazole] Hives   Current Medications: Current Outpatient Prescriptions  Medication Sig Dispense Refill  . ACCU-CHEK AVIVA PLUS test strip TEST BLOOD SUGAR ONCE DAILY 100 each 12  . albuterol (PROVENTIL HFA;VENTOLIN HFA) 108 (90 Base) MCG/ACT inhaler Inhale 2 puffs into the lungs every 6 (six) hours as needed for wheezing or shortness of breath. 1 Inhaler 2  . atorvastatin (LIPITOR) 20 MG tablet TAKE 1 TABLET BY MOUTH EVERY NIGHT AT BEDTIME 30 tablet 12  . divalproex (DEPAKOTE) 500 MG DR tablet Take 1 tablet (500 mg total) by mouth 1 day or 1 dose. 90 tablet 1  . doxycycline (VIBRAMYCIN) 50 MG capsule   0  . Insulin Detemir (LEVEMIR) 100 UNIT/ML  Pen Inject 38 Units into the skin 2 (two) times daily. 15 mL 11  . Insulin Pen Needle (BD PEN NEEDLE NANO U/F) 32G X 4 MM MISC 1 each by Does not apply route daily. 100 each 5  . lisinopril (PRINIVIL,ZESTRIL) 2.5 MG tablet Take 1 tablet (2.5 mg total) by mouth daily. 30 tablet 12  . metformin (FORTAMET) 1000 MG (OSM) 24 hr tablet Take 1,000 mg by mouth 2 (two) times daily.  12  . NOVOTWIST 32G X 5 MM MISC USE WITH LEVEMIR PEN BID  5  . omeprazole (PRILOSEC) 10 MG capsule TAKE 1 CAPSULE BY MOUTH EVERY DAY 30 capsule 12  . pioglitazone (ACTOS) 45 MG tablet TAKE 1 TABLET BY MOUTH EVERY DAY 30 tablet 12  . traZODone (DESYREL) 100 MG tablet Take 1 tablet (100 mg  total) by mouth at bedtime. 90 tablet 1  . Vilazodone HCl (VIIBRYD) 40 MG TABS Take 1 tablet (40 mg total) by mouth every morning. 90 tablet 1  . ziprasidone (GEODON) 60 MG capsule Take 1 capsule (60 mg total) by mouth 3 (three) times daily. 90 capsule 1   No current facility-administered medications for this visit.    Previous Psychotropic Medications:  Patient reported that she has previously tried Risperdal and Seroquel and Zyprexa Paxil and Celexa. She reported that she has history of adverse reaction to Abilify when she had hives. She stated that she has never tried Depakote or lithium and Tegretol. She denied any history of suicide attempts in the past.  Substance Abuse History in the last 12 months:  No.  Consequences of Substance Abuse: Negative NA  Medical Decision Making:  Review of Psycho-Social Stressors (1) and Established Problem, Worsening (2)  Treatment Plan Summary: Medication management   Paranoia Continue  Geodon 60 mg by mouth TID  I will start her on Depakote 250 mg in the morning and 500 at bedtime. She will continue on Viibryd 40 mg daily She has enough supply of benztropine at home.   Insomnia Continue  trazodone 100 mg by mouth daily at bedtime when necessary for insomnia  Lab results were discussed  with the patient including slightly elevated prolactin level and she reported that she does not have any adverse effects at this time.  She will follow up in 2 months or earlier depending on her symptoms   More than 50% of the time spent in psychoeducation, counseling and coordination of care.  Time spent with the patient 25 minutes Follow-up in 2 months.    This note was generated in part or whole with voice recognition software. Voice regonition is usually quite accurate but there are transcription errors that can and very often do occur. I apologize for any typographical errors that were not detected and corrected.    Brandy Hale, MD    6/20/20172:43 PM

## 2015-12-12 ENCOUNTER — Other Ambulatory Visit: Payer: Self-pay | Admitting: Psychiatry

## 2015-12-19 ENCOUNTER — Other Ambulatory Visit: Payer: Self-pay | Admitting: Psychiatry

## 2015-12-24 ENCOUNTER — Other Ambulatory Visit: Payer: Self-pay | Admitting: Psychiatry

## 2016-01-06 ENCOUNTER — Ambulatory Visit: Payer: Self-pay | Admitting: Family Medicine

## 2016-01-13 DIAGNOSIS — G4733 Obstructive sleep apnea (adult) (pediatric): Secondary | ICD-10-CM | POA: Diagnosis not present

## 2016-01-14 DIAGNOSIS — Z01419 Encounter for gynecological examination (general) (routine) without abnormal findings: Secondary | ICD-10-CM | POA: Diagnosis not present

## 2016-01-15 ENCOUNTER — Ambulatory Visit (INDEPENDENT_AMBULATORY_CARE_PROVIDER_SITE_OTHER): Payer: 59 | Admitting: Psychiatry

## 2016-01-15 ENCOUNTER — Encounter: Payer: Self-pay | Admitting: Psychiatry

## 2016-01-15 VITALS — BP 115/72 | HR 82 | Temp 97.8°F | Ht 62.5 in | Wt 229.2 lb

## 2016-01-15 DIAGNOSIS — F251 Schizoaffective disorder, depressive type: Secondary | ICD-10-CM

## 2016-01-15 DIAGNOSIS — F429 Obsessive-compulsive disorder, unspecified: Secondary | ICD-10-CM

## 2016-01-15 MED ORDER — VILAZODONE HCL 40 MG PO TABS: 40.0000 mg | ORAL_TABLET | Freq: Every morning | ORAL | 1 refills | Status: DC

## 2016-01-15 MED ORDER — ZIPRASIDONE HCL 60 MG PO CAPS: 60.0000 mg | ORAL_CAPSULE | Freq: Three times a day (TID) | ORAL | 1 refills | Status: DC

## 2016-01-15 MED ORDER — DIVALPROEX SODIUM 500 MG PO DR TAB: 500.0000 mg | DELAYED_RELEASE_TABLET | ORAL | 1 refills | Status: DC

## 2016-01-15 MED ORDER — TRAZODONE HCL 100 MG PO TABS: 100.0000 mg | ORAL_TABLET | Freq: Every day | ORAL | 1 refills | Status: DC

## 2016-01-15 MED ORDER — DIVALPROEX SODIUM 250 MG PO DR TAB: 250.0000 mg | DELAYED_RELEASE_TABLET | Freq: Every morning | ORAL | 1 refills | Status: DC

## 2016-01-15 MED ORDER — VILAZODONE HCL 10 MG PO TABS: 10.0000 mg | ORAL_TABLET | Freq: Every evening | ORAL | 1 refills | Status: DC

## 2016-01-15 NOTE — Progress Notes (Signed)
Psychiatric Follow up MD/NP Note   Patient Identification: Tabitha HouseholderKathleen J Appleman MRN:  [id number] Date of Evaluation:  01/15/2016 Referral Source: [facility].  Chief Complaint:   Chief Complaint    Follow-up; Medication Refill     Visit Diagnosis:    ICD-9-CM ICD-10-CM   1. Schizoaffective disorder, depressive type (HCC) 295.70 F25.1   2. OCD (obsessive compulsive disorder) 300.3 F42.9    Diagnosis:   Patient Active Problem List   Diagnosis Date Noted  . Obesity [E66.9] 09/24/2015  . DM (diabetes mellitus) (HCC) [E11.9] 12/03/2014  . HBP (high blood pressure) [I10] 12/03/2014  . Hyperlipidemia [E78.5] 12/03/2014  . Bipolar disorder in partial remission (HCC) [F31.70] 12/03/2014  . GERD without esophagitis [K21.9] 12/03/2014   History of Present Illness:    Patient is a 45 year-old married female who presented for follow-up.Patient reported that she Decided not to relocate to [place] and will be staying here for a long period of time. She reported that she has been feeling that her paranoia is improving but her OCD symptoms are getting worse. She continues to ask for forgiveness from the Jesus and does not know if he will forgive her. She reported that her husband and her family is concerned about her behavior. She reported that she prays a lot. Patient reported that she still has been helping her children and is home schooling them. Her husband is a double amputee and stays at home as well. She feels that it is a big help for her. She has been compliant with her medication and is interested in going higher on the dose of 5. To help with her anxiety symptoms. She appears more calm and alert during the interview. We discussed at length about her anxiety symptoms and the reasons for her anxiety. She is motivated at this time. She denied having any perceptual disturbances and denied having any suicidal homicidal ideations or plans.  Past Medical History:  Past  Medical History:  Diagnosis Date  . Asthma   . Depression   . Diabetes mellitus without complication (HCC)   . Diabetes mellitus, type II (HCC)   . GERD (gastroesophageal reflux disease)   . Mild sleep apnea     Past Surgical History:  Procedure Laterality Date  . CESAREAN SECTION  2006/2008  . CHOLECYSTECTOMY  2006   Family History:  Family History  Problem Relation Age of Onset  . Diabetes Father   . Hyperlipidemia Father   . Diabetes Brother   . Lupus Mother    Social History:   Social History   Social History  . Marital status: Married    Spouse name: N/A  . Number of children: N/A  . Years of education: N/A   Social History Main Topics  . Smoking status: Never Smoker  . Smokeless tobacco: Never Used  . Alcohol use No  . Drug use: No  . Sexual activity: Yes    Birth control/ protection: None   Other Topics Concern  . None   Social History Narrative  . None   Additional Social History:  Married x 11 years. Has a good relationship with husband and has a 5410 and 617 yo. 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 She stated that she does not smoke cigarettes and does not use any drugs or alcohol   Musculoskeletal: Strength & Muscle Tone: within normal limits Gait & Station: normal Patient leans: N/A  Psychiatric Specialty Exam: Anxiety  Symptoms include insomnia and  nervous/anxious behavior.    Medication Refill  Pertinent negatives include no congestion or coughing.    Review of Systems  Constitutional: Positive for malaise/fatigue. Negative for weight loss.  HENT: Negative for congestion and hearing loss.   Eyes: Negative for photophobia.  Respiratory: Negative for cough and sputum production.   Genitourinary: Negative for urgency.  Psychiatric/Behavioral: Positive for depression and hallucinations. The patient is nervous/anxious and has insomnia.   All other systems reviewed and are negative.   Blood pressure 115/72, pulse 82,  temperature 97.8 F (36.6 C), temperature source Oral, height 5' 2.5" (1.588 m), weight 229 lb 3.2 oz (104 kg).Body mass index is 41.25 kg/m.  General Appearance: Casual and Fairly Groomed  Eye Contact:  Fair  Speech:  Clear and Coherent  Volume:  Normal  Mood:  Anxious and Depressed  Affect:  Congruent  Thought Process:  Coherent  Orientation:  Full (Time, Place, and Person)  Thought Content:  Obsessions and Paranoid Ideation  Suicidal Thoughts:  No  Homicidal Thoughts:  No  Memory:  Immediate;   Fair  Judgement:  Fair  Insight:  Fair  Psychomotor Activity:  Decreased  Concentration:  Fair  Recall:  [employer] of Knowledge:Fair  Language: Fair  Akathisia:  No  Handed:  Right  AIMS (if indicated):  none  Assets:  Communication Skills Desire for Improvement Housing Intimacy Physical Health Social Support  ADL's:  Intact  Cognition: WNL  Sleep:  6-7    Is the patient at risk to self?  No. Has the patient been a risk to self in the past 6 months?  No. Has the patient been a risk to self within the distant past?  No. Is the patient a risk to others?  No. Has the patient been a risk to others in the past 6 months?  No. Has the patient been a risk to others within the distant past?  No.  Allergies:   Allergies  Allergen Reactions  . Abilify [Aripiprazole] Hives   Current Medications: Current Outpatient Prescriptions  Medication Sig Dispense Refill  . ACCU-CHEK AVIVA PLUS test strip TEST BLOOD SUGAR ONCE DAILY 100 each 12  . albuterol (PROVENTIL HFA;VENTOLIN HFA) 108 (90 Base) MCG/ACT inhaler Inhale 2 puffs into the lungs every 6 (six) hours as needed for wheezing or shortness of breath. 1 Inhaler 2  . atorvastatin (LIPITOR) 20 MG tablet TAKE 1 TABLET BY MOUTH EVERY NIGHT AT BEDTIME 30 tablet 12  . divalproex (DEPAKOTE) 250 MG DR tablet Take 1 tablet (250 mg total) by mouth every morning. 90 tablet 1  . divalproex (DEPAKOTE) 500 MG DR tablet Take 1 tablet (500 mg total)  by mouth 1 day or 1 dose. 90 tablet 1  . doxycycline (VIBRAMYCIN) 50 MG capsule   0  . Insulin Detemir (LEVEMIR) 100 UNIT/ML Pen Inject 38 Units into the skin 2 (two) times daily. 15 mL 11  . Insulin Pen Needle (BD PEN NEEDLE NANO U/F) 32G X 4 MM MISC 1 each by Does not apply route daily. 100 each 5  . lisinopril (PRINIVIL,ZESTRIL) 2.5 MG tablet Take 1 tablet (2.5 mg total) by mouth daily. 30 tablet 12  . metformin (FORTAMET) 1000 MG (OSM) 24 hr tablet Take 1,000 mg by mouth 2 (two) times daily.  12  . NOVOTWIST 32G X 5 MM MISC USE WITH LEVEMIR PEN BID  5  . omeprazole (PRILOSEC) 10 MG capsule TAKE 1 CAPSULE BY MOUTH EVERY DAY 30 capsule 12  . pioglitazone (ACTOS)  45 MG tablet TAKE 1 TABLET BY MOUTH EVERY DAY 30 tablet 12  . traZODone (DESYREL) 100 MG tablet Take 1 tablet (100 mg total) by mouth at bedtime. 90 tablet 1  . Vilazodone HCl (VIIBRYD) 40 MG TABS Take 1 tablet (40 mg total) by mouth every morning. 90 tablet 1  . ziprasidone (GEODON) 60 MG capsule TAKE ONE CAPSULE BY MOUTH THREE TIMES DAILY 270 capsule 1  . ziprasidone (GEODON) 60 MG capsule TAKE ONE CAPSULE BY MOUTH THREE TIMES DAILY 90 capsule 0   No current facility-administered medications for this visit.     Previous Psychotropic Medications:  Patient reported that she has previously tried Risperdal and Seroquel and Zyprexa Paxil and Celexa. She reported that she has history of adverse reaction to Abilify when she had hives. She stated that she has never tried Depakote or lithium and Tegretol. She denied any history of suicide attempts in the past.  Substance Abuse History in the last 12 months:  No.  Consequences of Substance Abuse: Negative NA  Medical Decision Making:  Review of Psycho-Social Stressors (1) and Established Problem, Worsening (2)  Treatment Plan Summary: Medication management   Paranoia Continue  Geodon 60 mg by mouth TID  I will start her on Depakote 250 mg in the morning and 500 at bedtime. She  will continue on Viibryd 40 mg daily And 10 mg in the evening.  She has enough supply of benztropine at home. Continue  trazodone 100 mg by mouth daily at bedtime when necessary for insomnia    She will follow up in 2 months or earlier depending on her symptoms   More than 50% of the time spent in psychoeducation, counseling and coordination of care.  Time spent with the patient 25 minutes Follow-up in 2 months.    This note was generated in part or whole with voice recognition software. Voice regonition is usually quite accurate but there are transcription errors that can and very often do occur. I apologize for any typographical errors that were not detected and corrected.    Brandy Hale, MD    9/20/20172:47 PM

## 2016-01-16 ENCOUNTER — Encounter: Payer: Self-pay | Admitting: Family Medicine

## 2016-01-16 ENCOUNTER — Ambulatory Visit (INDEPENDENT_AMBULATORY_CARE_PROVIDER_SITE_OTHER): Payer: Medicare Other | Admitting: Family Medicine

## 2016-01-16 VITALS — BP 124/63 | HR 81 | Temp 98.2°F | Resp 16 | Ht 62.5 in | Wt 226.0 lb

## 2016-01-16 DIAGNOSIS — Z794 Long term (current) use of insulin: Secondary | ICD-10-CM

## 2016-01-16 DIAGNOSIS — E785 Hyperlipidemia, unspecified: Secondary | ICD-10-CM

## 2016-01-16 DIAGNOSIS — E119 Type 2 diabetes mellitus without complications: Secondary | ICD-10-CM

## 2016-01-16 DIAGNOSIS — E0800 Diabetes mellitus due to underlying condition with hyperosmolarity without nonketotic hyperglycemic-hyperosmolar coma (NKHHC): Secondary | ICD-10-CM

## 2016-01-16 DIAGNOSIS — K219 Gastro-esophageal reflux disease without esophagitis: Secondary | ICD-10-CM

## 2016-01-16 DIAGNOSIS — F3170 Bipolar disorder, currently in remission, most recent episode unspecified: Secondary | ICD-10-CM

## 2016-01-16 DIAGNOSIS — I10 Essential (primary) hypertension: Secondary | ICD-10-CM | POA: Diagnosis not present

## 2016-01-16 DIAGNOSIS — Z23 Encounter for immunization: Secondary | ICD-10-CM

## 2016-01-16 DIAGNOSIS — E669 Obesity, unspecified: Secondary | ICD-10-CM

## 2016-01-16 LAB — POCT GLYCOSYLATED HEMOGLOBIN (HGB A1C)

## 2016-01-16 MED ORDER — INSULIN DETEMIR 100 UNIT/ML FLEXPEN: 40.0000 [IU] | PEN_INJECTOR | Freq: Two times a day (BID) | SUBCUTANEOUS | 11 refills | Status: DC

## 2016-01-16 NOTE — Progress Notes (Signed)
Name: Tabitha [name]   MRN: [id number]    DOB: [birth year]   Date:01/16/2016       Progress Note  Subjective  Chief Complaint  Chief Complaint  Patient presents with  . Diabetes    HPI Here for f/u of DM.  Her BSs over past month have been 110-220.  About 1/3-1/2 over 200; less than 1/4 under 150.  She is feeling pretty well overall.  Still seeing Psych and taking meds..  No problem-specific Assessment & Plan notes found for this encounter.   Past Medical History:  Diagnosis Date  . Asthma   . Depression   . Diabetes mellitus without complication (HCC)   . Diabetes mellitus, type II (HCC)   . GERD (gastroesophageal reflux disease)   . Mild sleep apnea     Past Surgical History:  Procedure Laterality Date  . CESAREAN SECTION  2006/2008  . CHOLECYSTECTOMY  2006    Family History  Problem Relation Age of Onset  . Diabetes Father   . Hyperlipidemia Father   . Diabetes Brother   . Lupus Mother     Social History   Social History  . Marital status: Married    Spouse name: N/A  . Number of children: N/A  . Years of education: N/A   Occupational History  . Not on file.   Social History Main Topics  . Smoking status: Never Smoker  . Smokeless tobacco: Never Used  . Alcohol use No  . Drug use: No  . Sexual activity: Yes    Birth control/ protection: None   Other Topics Concern  . Not on file   Social History Narrative  . No narrative on file     Current Outpatient Prescriptions:  .  ACCU-CHEK AVIVA PLUS test strip, TEST BLOOD SUGAR ONCE DAILY, Disp: 100 each, Rfl: 12 .  albuterol (PROVENTIL HFA;VENTOLIN HFA) 108 (90 Base) MCG/ACT inhaler, Inhale 2 puffs into the lungs every 6 (six) hours as needed for wheezing or shortness of breath., Disp: 1 Inhaler, Rfl: 2 .  atorvastatin (LIPITOR) 20 MG tablet, TAKE 1 TABLET BY MOUTH EVERY NIGHT AT BEDTIME, Disp: 30 tablet, Rfl: 12 .  divalproex (DEPAKOTE) 250 MG DR tablet, Take 1 tablet (250 mg total) by mouth every  morning., Disp: 90 tablet, Rfl: 1 .  divalproex (DEPAKOTE) 500 MG DR tablet, Take 1 tablet (500 mg total) by mouth 1 day or 1 dose., Disp: 90 tablet, Rfl: 1 .  doxycycline (VIBRAMYCIN) 50 MG capsule, , Disp: , Rfl: 0 .  Insulin Detemir (LEVEMIR) 100 UNIT/ML Pen, Inject 40 Units into the skin 2 (two) times daily., Disp: 15 mL, Rfl: 11 .  Insulin Pen Needle (BD PEN NEEDLE NANO U/F) 32G X 4 MM MISC, 1 each by Does not apply route daily., Disp: 100 each, Rfl: 5 .  lisinopril (PRINIVIL,ZESTRIL) 2.5 MG tablet, Take 1 tablet (2.5 mg total) by mouth daily., Disp: 30 tablet, Rfl: 12 .  metformin (FORTAMET) 1000 MG (OSM) 24 hr tablet, Take 1,000 mg by mouth 2 (two) times daily., Disp: , Rfl: 12 .  NOVOTWIST 32G X 5 MM MISC, USE WITH LEVEMIR PEN BID, Disp: , Rfl: 5 .  omeprazole (PRILOSEC) 10 MG capsule, TAKE 1 CAPSULE BY MOUTH EVERY DAY, Disp: 30 capsule, Rfl: 12 .  pioglitazone (ACTOS) 45 MG tablet, TAKE 1 TABLET BY MOUTH EVERY DAY, Disp: 30 tablet, Rfl: 12 .  traZODone (DESYREL) 100 MG tablet, Take 1 tablet (100 mg total) by mouth at bedtime.,  Disp: 90 tablet, Rfl: 1 .  Vilazodone HCl (VIIBRYD) 10 MG TABS, Take 1 tablet (10 mg total) by mouth every evening., Disp: 30 tablet, Rfl: 1 .  Vilazodone HCl (VIIBRYD) 40 MG TABS, Take 1 tablet (40 mg total) by mouth every morning., Disp: 90 tablet, Rfl: 1 .  ziprasidone (GEODON) 60 MG capsule, TAKE ONE CAPSULE BY MOUTH THREE TIMES DAILY, Disp: 90 capsule, Rfl: 0  Allergies  Allergen Reactions  . Abilify [Aripiprazole] Hives     Review of Systems  Constitutional: Positive for weight loss (slow , desired.). Negative for chills, fever and malaise/fatigue.  HENT: Negative for hearing loss.   Eyes: Negative for blurred vision and double vision.  Respiratory: Negative for cough, shortness of breath and wheezing.   Cardiovascular: Negative for chest pain, palpitations and leg swelling.  Gastrointestinal: Negative for abdominal pain, blood in stool, diarrhea and  heartburn.  Genitourinary: Negative for dysuria, hematuria and urgency.  Musculoskeletal: Negative for joint pain and myalgias.  Skin: Negative for rash.  Neurological: Negative for dizziness, tremors, weakness and headaches.      Objective  Vitals:   01/16/16 1317  BP: 124/63  Pulse: 81  Resp: 16  Temp: 98.2 F (36.8 C)  TempSrc: Oral  Weight: 226 lb (102.5 kg)  Height: 5' 2.5" (1.588 m)    Physical Exam  Constitutional: She is oriented to person, place, and time and well-developed, well-nourished, and in no distress. No distress.  HENT:  Head: Normocephalic and atraumatic.  Eyes: Conjunctivae and EOM are normal. Pupils are equal, round, and reactive to light. No scleral icterus.  Neck: Normal range of motion. Neck supple. Carotid bruit is not present. No thyromegaly present.  Cardiovascular: Normal rate, regular rhythm and normal heart sounds.  Exam reveals no gallop and no friction rub.   No murmur heard. Pulmonary/Chest: Effort normal and breath sounds normal. No respiratory distress. She has no wheezes. She has no rales.  Abdominal: Soft. Bowel sounds are normal. She exhibits no distension and no mass. There is no tenderness.  Musculoskeletal: She exhibits no edema.  Lymphadenopathy:    She has no cervical adenopathy.  Neurological: She is alert and oriented to person, place, and time.  Vitals reviewed.      Recent Results (from the past 2160 hour(s))  POCT HgB A1C     Status: Abnormal   Collection Time: 01/16/16  1:30 PM  Result Value Ref Range   Hemoglobin A1C 6.4%      Assessment & Plan  Problem List Items Addressed This Visit      Cardiovascular and Mediastinum   HBP (high blood pressure)     Digestive   GERD without esophagitis     Endocrine   DM (diabetes mellitus) (HCC)   Relevant Medications   Insulin Detemir (LEVEMIR) 100 UNIT/ML Pen     Other   Hyperlipidemia   Bipolar disorder in partial remission (HCC)   Obesity   Relevant  Medications   Insulin Detemir (LEVEMIR) 100 UNIT/ML Pen    Other Visit Diagnoses    Diabetes mellitus without complication (HCC)    -  Primary   Relevant Medications   Insulin Detemir (LEVEMIR) 100 UNIT/ML Pen   Other Relevant Orders   POCT HgB A1C (Completed)   Immunization due       Relevant Orders   Flu Vaccine QUAD 36+ mos PF IM (Fluarix & Fluzone Quad PF)      Meds ordered this encounter  Medications  . Insulin Detemir (LEVEMIR)  100 UNIT/ML Pen    Sig: Inject 40 Units into the skin 2 (two) times daily.    Dispense:  15 mL    Refill:  11   1. Diabetes mellitus without complication (HCC)  - POCT HgB A1C-6.4 - Insulin Detemir (LEVEMIR) 100 UNIT/ML Pen; Inject 40 Units into the skin 2 (two) times daily.  Dispense: 15 mL; Refill: 11- increasaed from 38 units bid. Cont Metformin and Actos 2. Diabetes mellitus due to underlying condition with hyperosmolarity without coma, with long-term current use of insulin (HCC)   3. Essential hypertension   4. GERD without esophagitis   5. Hyperlipidemia   6. Obesity   7. Bipolar disorder in partial remission, most recent episode unspecified type (HCC)   8. Type 2 diabetes mellitus without complication, without long-term current use of insulin (HCC)  - Insulin Detemir (LEVEMIR) 100 UNIT/ML Pen; Inject 40 Units into the skin 2 (two) times daily.  Dispense: 15 mL; Refill: 11  9. Immunization due  - Flu Vaccine QUAD 36+ mos PF IM (Fluarix & Fluzone Quad PF)

## 2016-01-22 DIAGNOSIS — Z1231 Encounter for screening mammogram for malignant neoplasm of breast: Secondary | ICD-10-CM | POA: Diagnosis not present

## 2016-03-16 ENCOUNTER — Encounter: Payer: Self-pay | Admitting: Psychiatry

## 2016-03-16 ENCOUNTER — Ambulatory Visit (INDEPENDENT_AMBULATORY_CARE_PROVIDER_SITE_OTHER): Payer: 59 | Admitting: Psychiatry

## 2016-03-16 ENCOUNTER — Other Ambulatory Visit: Payer: Self-pay | Admitting: Psychiatry

## 2016-03-16 VITALS — BP 120/74 | HR 86 | Ht 62.5 in | Wt 234.6 lb

## 2016-03-16 DIAGNOSIS — F251 Schizoaffective disorder, depressive type: Secondary | ICD-10-CM

## 2016-03-16 DIAGNOSIS — F429 Obsessive-compulsive disorder, unspecified: Secondary | ICD-10-CM | POA: Diagnosis not present

## 2016-03-16 MED ORDER — ZIPRASIDONE HCL 40 MG PO CAPS
40.0000 mg | ORAL_CAPSULE | Freq: Two times a day (BID) | ORAL | 0 refills | Status: DC
Start: 2016-03-16 — End: 2016-03-24

## 2016-03-16 MED ORDER — PALIPERIDONE ER 6 MG PO TB24: 6.0000 mg | ORAL_TABLET | Freq: Every day | ORAL | 0 refills | Status: DC

## 2016-03-16 MED ORDER — PALIPERIDONE PALMITATE 234 MG/1.5ML IM SUSP: 234.0000 mg | Freq: Once | INTRAMUSCULAR | 0 refills | Status: DC

## 2016-03-16 MED ORDER — DIVALPROEX SODIUM 500 MG PO DR TAB: 500.0000 mg | DELAYED_RELEASE_TABLET | ORAL | 1 refills | Status: DC

## 2016-03-16 MED ORDER — VILAZODONE HCL 40 MG PO TABS: 40.0000 mg | ORAL_TABLET | Freq: Every morning | ORAL | 1 refills | Status: DC

## 2016-03-16 MED ORDER — DIVALPROEX SODIUM 250 MG PO DR TAB: 250.0000 mg | DELAYED_RELEASE_TABLET | Freq: Every morning | ORAL | 1 refills | Status: DC

## 2016-03-16 NOTE — Progress Notes (Signed)
Psychiatric Follow up MD/NP Note   Patient Identification: Tabitha [name] MRN:  [id number] Date of Evaluation:  03/16/2016 Referral Source: [facility].  Chief Complaint:   Chief Complaint    Follow-up     Visit Diagnosis:    ICD-9-CM ICD-10-CM   1. Schizoaffective disorder, depressive type (HCC) 295.70 F25.1   2. Obsessive-compulsive disorder, unspecified type 300.3 F42.9    Diagnosis:   Patient Active Problem List   Diagnosis Date Noted  . Obesity [E66.9] 09/24/2015  . DM (diabetes mellitus) (HCC) [E11.9] 12/03/2014  . HBP (high blood pressure) [I10] 12/03/2014  . Hyperlipidemia [E78.5] 12/03/2014  . Bipolar disorder in partial remission (HCC) [F31.70] 12/03/2014  . GERD without esophagitis [K21.9] 12/03/2014   History of Present Illness:    Patient is a 45 year-old married female who presented for follow-up.Patient reported that she continues to have hallucinations and paranoia. She reported that she has been feeling that there are cameras installed in her house and in the church. Patient reported that she feels that people are watching her. She reported that she tries to talk to the ladies at the church to keep herself calm down. She has also spoken to her husband and her children about the cameras in the house. She feels that the feeling of the camera is getting worse. She has been compliant with her medication but it is not getting better. Patient appeared anxious and apprehensive during the interview. We discussed about her medications. She reported that she has not tried the injectables and is interested in the same. We discussed about restarting her on [place]. She reported that she might have taken Invega in the past when she was following with Dr. Tomasa Rand in [place].   She reported that she is sleeping well at night. She continues to live with her husband and her family.   She denied having any suicidal homicidal ideations or  plans.  Past Medical History:  Past Medical History:  Diagnosis Date  . Asthma   . Depression   . Diabetes mellitus without complication (HCC)   . Diabetes mellitus, type II (HCC)   . GERD (gastroesophageal reflux disease)   . Mild sleep apnea     Past Surgical History:  Procedure Laterality Date  . CESAREAN SECTION  2006/2008  . CHOLECYSTECTOMY  2006   Family History:  Family History  Problem Relation Age of Onset  . Diabetes Father   . Hyperlipidemia Father   . Diabetes Brother   . Lupus Mother    Social History:   Social History   Social History  . Marital status: Married    Spouse name: N/A  . Number of children: N/A  . Years of education: N/A   Social History Main Topics  . Smoking status: Never Smoker  . Smokeless tobacco: Never Used  . Alcohol use No  . Drug use: No  . Sexual activity: Yes    Partners: Male    Birth control/ protection: None   Other Topics Concern  . None   Social History Narrative  . None   Additional Social History:  Married x 11 years. Has a good relationship with husband and has a 50 and 101 yo. 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 She stated that she does not smoke cigarettes and does not use any drugs or alcohol   Musculoskeletal: Strength & Muscle Tone: within normal limits Gait & Station: normal Patient leans: N/A  Psychiatric Specialty Exam: Medication Refill  Pertinent negatives include no congestion or coughing.  Anxiety  Symptoms include insomnia and nervous/anxious behavior.      Review of Systems  Constitutional: Positive for malaise/fatigue. Negative for weight loss.  HENT: Negative for congestion and hearing loss.   Eyes: Negative for photophobia.  Respiratory: Negative for cough and sputum production.   Genitourinary: Negative for urgency.  Psychiatric/Behavioral: Positive for depression and hallucinations. The patient is nervous/anxious and has insomnia.   All other systems reviewed  and are negative.   Blood pressure 120/74, pulse 86, height 5' 2.5" (1.588 m), weight 234 lb 9.6 oz (106.4 kg).Body mass index is 42.23 kg/m.  General Appearance: Casual and Fairly Groomed  Eye Contact:  Fair  Speech:  Clear and Coherent  Volume:  Normal  Mood:  Anxious and Depressed  Affect:  Congruent  Thought Process:  Coherent  Orientation:  Full (Time, Place, and Person)  Thought Content:  Obsessions and Paranoid Ideation  Suicidal Thoughts:  No  Homicidal Thoughts:  No  Memory:  Immediate;   Fair  Judgement:  Fair  Insight:  Fair  Psychomotor Activity:  Decreased  Concentration:  Fair  Recall:  [employer]  Fund of Knowledge:Fair  Language: Fair  Akathisia:  No  Handed:  Right  AIMS (if indicated):  none  Assets:  Communication Skills Desire for Improvement Housing Intimacy Physical Health Social Support  ADL's:  Intact  Cognition: WNL  Sleep:  6-7    Is the patient at risk to self?  No. Has the patient been a risk to self in the past 6 months?  No. Has the patient been a risk to self within the distant past?  No. Is the patient a risk to others?  No. Has the patient been a risk to others in the past 6 months?  No. Has the patient been a risk to others within the distant past?  No.  Allergies:   Allergies  Allergen Reactions  . Abilify [Aripiprazole] Hives   Current Medications: Current Outpatient Prescriptions  Medication Sig Dispense Refill  . ACCU-CHEK AVIVA PLUS test strip TEST BLOOD SUGAR ONCE DAILY 100 each 12  . albuterol (PROVENTIL HFA;VENTOLIN HFA) 108 (90 Base) MCG/ACT inhaler Inhale 2 puffs into the lungs every 6 (six) hours as needed for wheezing or shortness of breath. 1 Inhaler 2  . atorvastatin (LIPITOR) 20 MG tablet TAKE 1 TABLET BY MOUTH EVERY NIGHT AT BEDTIME 30 tablet 12  . divalproex (DEPAKOTE) 250 MG DR tablet Take 1 tablet (250 mg total) by mouth every morning. 90 tablet 1  . divalproex (DEPAKOTE) 500 MG DR tablet Take 1 tablet (500 mg  total) by mouth 1 day or 1 dose. 90 tablet 1  . doxycycline (VIBRAMYCIN) 50 MG capsule   0  . Insulin Detemir (LEVEMIR) 100 UNIT/ML Pen Inject 40 Units into the skin 2 (two) times daily. 15 mL 11  . Insulin Pen Needle (BD PEN NEEDLE NANO U/F) 32G X 4 MM MISC 1 each by Does not apply route daily. 100 each 5  . lisinopril (PRINIVIL,ZESTRIL) 2.5 MG tablet Take 1 tablet (2.5 mg total) by mouth daily. 30 tablet 12  . metformin (FORTAMET) 1000 MG (OSM) 24 hr tablet Take 1,000 mg by mouth 2 (two) times daily.  12  . NOVOTWIST 32G X 5 MM MISC USE WITH LEVEMIR PEN BID  5  . omeprazole (PRILOSEC) 10 MG capsule TAKE 1 CAPSULE BY MOUTH EVERY DAY 30 capsule 12  . pioglitazone (ACTOS) 45 MG tablet TAKE 1  TABLET BY MOUTH EVERY DAY 30 tablet 12  . traZODone (DESYREL) 100 MG tablet Take 1 tablet (100 mg total) by mouth at bedtime. 90 tablet 1  . Vilazodone HCl (VIIBRYD) 40 MG TABS Take 1 tablet (40 mg total) by mouth every morning. 90 tablet 1  . paliperidone (INVEGA SUSTENNA) 234 MG/1.5ML SUSP injection Inject 234 mg into the muscle once. 1.5 mL 0  . paliperidone (INVEGA) 6 MG 24 hr tablet Take 1 tablet (6 mg total) by mouth daily. 15 tablet 0  . ziprasidone (GEODON) 40 MG capsule Take 1 capsule (40 mg total) by mouth 2 (two) times daily with a meal. 60 capsule 0   No current facility-administered medications for this visit.     Previous Psychotropic Medications:  Patient reported that she has previously tried Risperdal and Seroquel and Zyprexa Paxil and Celexa. She reported that she has history of adverse reaction to Abilify when she had hives. She stated that she has never tried Depakote or lithium and Tegretol. She denied any history of suicide attempts in the past.  Substance Abuse History in the last 12 months:  No.  Consequences of Substance Abuse: Negative NA  Medical Decision Making:  Review of Psycho-Social Stressors (1) and Established Problem, Worsening (2)  Treatment Plan  Summary: Medication management   Paranoia Decrease Geodon 40 mg by mouth twice a day I will start her on an Invega 6 mg daily to determine  her tolerability Will give her a prescription of  Invega Sustenna 234 mg  and she will follow-up next week.  Continue Depakote 250 mg in the morning and 500 at bedtime. She will continue on Viibryd 40 mg daily  She has enough supply of benztropine at home. Continue  trazodone 100 mg by mouth daily at bedtime when necessary for insomnia    She will follow up in 1  weeks or earlier depending on her symptoms   More than 50% of the time spent in psychoeducation, counseling and coordination of care.     This note was generated in part or whole with voice recognition software. Voice regonition is usually quite accurate but there are transcription errors that can and very often do occur. I apologize for any typographical errors that were not detected and corrected.    Brandy HaleUzma Scherrie Seneca, MD    11/20/20172:21 PM

## 2016-03-16 NOTE — Telephone Encounter (Signed)
Pt need only a month supply

## 2016-03-17 ENCOUNTER — Other Ambulatory Visit: Payer: Self-pay | Admitting: Psychiatry

## 2016-03-24 ENCOUNTER — Ambulatory Visit (INDEPENDENT_AMBULATORY_CARE_PROVIDER_SITE_OTHER): Payer: 59 | Admitting: *Deleted

## 2016-03-24 ENCOUNTER — Encounter: Payer: Self-pay | Admitting: Psychiatry

## 2016-03-24 VITALS — BP 136/73 | HR 91 | Temp 97.9°F | Resp 18 | Wt 235.0 lb

## 2016-03-24 DIAGNOSIS — F429 Obsessive-compulsive disorder, unspecified: Secondary | ICD-10-CM

## 2016-03-24 DIAGNOSIS — F251 Schizoaffective disorder, depressive type: Secondary | ICD-10-CM

## 2016-03-24 MED ORDER — PALIPERIDONE PALMITATE 234 MG/1.5ML IM SUSP
234.0000 mg | Freq: Once | INTRAMUSCULAR | Status: AC
  Administered 2016-03-24: 234 mg via INTRAMUSCULAR

## 2016-03-24 MED ORDER — PALIPERIDONE PALMITATE 156 MG/ML IM SUSP: 156.0000 mg | Freq: Once | INTRAMUSCULAR | 0 refills | Status: DC

## 2016-03-24 MED ORDER — PALIPERIDONE PALMITATE 156 MG/ML IM SUSP: 156.0000 mg | Freq: Once | INTRAMUSCULAR | Status: DC

## 2016-03-24 NOTE — Progress Notes (Signed)
Pt presented with her dose of  Invega [place] (234mg ) to be administered. She appears flat. Calm and cooperative throughout. After speaking with Dr. Garnetta BuddyFaheem, she came back to writers office. Shot placed in the upper outer quadrant of the right gluteal. Tolerated procedure well. Offered no questions or concerns. Will follow up in one week for an additional injection per Dr. Garnetta BuddyFaheem. She left in no acute distress.

## 2016-03-24 NOTE — Progress Notes (Signed)
Psychiatric Follow up MD/NP Note   Patient Identification: Tabitha [name] MRN:  [id number] Date of Evaluation:  03/24/2016 Referral Source: [facility].  Chief Complaint:   Chief Complaint    Follow-up; Medication Refill     Visit Diagnosis:    ICD-9-CM ICD-10-CM   1. Schizoaffective disorder, depressive type (HCC) 295.70 F25.1   2. Obsessive-compulsive disorder, unspecified type 300.3 F42.9    Diagnosis:   Patient Active Problem List   Diagnosis Date Noted  . Obesity [E66.9] 09/24/2015  . DM (diabetes mellitus) (HCC) [E11.9] 12/03/2014  . HBP (high blood pressure) [I10] 12/03/2014  . Hyperlipidemia [E78.5] 12/03/2014  . Bipolar disorder in partial remission (HCC) [F31.70] 12/03/2014  . GERD without esophagitis [K21.9] 12/03/2014   History of Present Illness:    Patient is a 45 year-old married female who presented for follow-up.Patient reported that she has started taking the Invega since her last appointment 1 week ago. She continues to have the feeling that the cameras are installed in her house. She does not have any side effects of the Invega. She brought the injection with her. She has already discussed with her husband and he agrees with the plan. She appeared calm and alert during the interview. She currently denied having any suicidal ideations or plans.   Patient reported that she slept well last night. She continues to be paranoid and has delusional thoughts. She currently lives with her husband and her 3 children. She is compliant with her medications. She denied having any suicidal homicidal ideations or plans.   .  Past Medical History:  Past Medical History:  Diagnosis Date  . Asthma   . Depression   . Diabetes mellitus without complication (HCC)   . Diabetes mellitus, type II (HCC)   . GERD (gastroesophageal reflux disease)   . Mild sleep apnea     Past Surgical History:  Procedure Laterality Date  . CESAREAN SECTION  2006/2008  .  CHOLECYSTECTOMY  2006   Family History:  Family History  Problem Relation Age of Onset  . Diabetes Father   . Hyperlipidemia Father   . Diabetes Brother   . Lupus Mother    Social History:   Social History   Social History  . Marital status: Married    Spouse name: N/A  . Number of children: N/A  . Years of education: N/A   Social History Main Topics  . Smoking status: Never Smoker  . Smokeless tobacco: Never Used  . Alcohol use No  . Drug use: No  . Sexual activity: Yes    Partners: Male    Birth control/ protection: None   Other Topics Concern  . None   Social History Narrative  . None   Additional Social History:  Married x 11 years. Has a good relationship with husband and has a 3410 and 107 yo. 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 She stated that she does not smoke cigarettes and does not use any drugs or alcohol   Musculoskeletal: Strength & Muscle Tone: within normal limits Gait & Station: normal Patient leans: N/A  Psychiatric Specialty Exam: Medication Refill  Pertinent negatives include no congestion or coughing.  Anxiety  Symptoms include insomnia and nervous/anxious behavior.      Review of Systems  Constitutional: Positive for malaise/fatigue. Negative for weight loss.  HENT: Negative for congestion and hearing loss.   Eyes: Negative for photophobia.  Respiratory: Negative for cough and sputum production.   Genitourinary: Negative  for urgency.  Psychiatric/Behavioral: Positive for depression and hallucinations. The patient is nervous/anxious and has insomnia.   All other systems reviewed and are negative.   Blood pressure 136/73, pulse 91, temperature 97.9 F (36.6 C), temperature source Tympanic, resp. rate 18, weight 235 lb (106.6 kg), SpO2 96 %.Body mass index is 42.3 kg/m.  General Appearance: Casual and Fairly Groomed  Eye Contact:  Fair  Speech:  Clear and Coherent  Volume:  Normal  Mood:  Anxious and Depressed   Affect:  Congruent  Thought Process:  Coherent  Orientation:  Full (Time, Place, and Person)  Thought Content:  Obsessions and Paranoid Ideation  Suicidal Thoughts:  No  Homicidal Thoughts:  No  Memory:  Immediate;   Fair  Judgement:  Fair  Insight:  Fair  Psychomotor Activity:  Decreased  Concentration:  Fair  Recall:  [employer] of Knowledge:Fair  Language: Fair  Akathisia:  No  Handed:  Right  AIMS (if indicated):  none  Assets:  Communication Skills Desire for Improvement Housing Intimacy Physical Health Social Support  ADL's:  Intact  Cognition: WNL  Sleep:  6-7    Is the patient at risk to self?  No. Has the patient been a risk to self in the past 6 months?  No. Has the patient been a risk to self within the distant past?  No. Is the patient a risk to others?  No. Has the patient been a risk to others in the past 6 months?  No. Has the patient been a risk to others within the distant past?  No.  Allergies:   Allergies  Allergen Reactions  . Abilify [Aripiprazole] Hives   Current Medications: Current Outpatient Prescriptions  Medication Sig Dispense Refill  . ACCU-CHEK AVIVA PLUS test strip TEST BLOOD SUGAR ONCE DAILY 100 each 12  . albuterol (PROVENTIL HFA;VENTOLIN HFA) 108 (90 Base) MCG/ACT inhaler Inhale 2 puffs into the lungs every 6 (six) hours as needed for wheezing or shortness of breath. 1 Inhaler 2  . atorvastatin (LIPITOR) 20 MG tablet TAKE 1 TABLET BY MOUTH EVERY NIGHT AT BEDTIME 30 tablet 12  . divalproex (DEPAKOTE) 250 MG DR tablet Take 1 tablet (250 mg total) by mouth every morning. 90 tablet 1  . divalproex (DEPAKOTE) 500 MG DR tablet Take 1 tablet (500 mg total) by mouth 1 day or 1 dose. 90 tablet 1  . doxycycline (VIBRAMYCIN) 50 MG capsule   0  . Insulin Detemir (LEVEMIR) 100 UNIT/ML Pen Inject 40 Units into the skin 2 (two) times daily. 15 mL 11  . Insulin Pen Needle (BD PEN NEEDLE NANO U/F) 32G X 4 MM MISC 1 each by Does not apply route  daily. 100 each 5  . lisinopril (PRINIVIL,ZESTRIL) 2.5 MG tablet Take 1 tablet (2.5 mg total) by mouth daily. 30 tablet 12  . metformin (FORTAMET) 1000 MG (OSM) 24 hr tablet Take 1,000 mg by mouth 2 (two) times daily.  12  . NOVOTWIST 32G X 5 MM MISC USE WITH LEVEMIR PEN BID  5  . omeprazole (PRILOSEC) 10 MG capsule TAKE 1 CAPSULE BY MOUTH EVERY DAY 30 capsule 12  . paliperidone (INVEGA) 6 MG 24 hr tablet Take 1 tablet (6 mg total) by mouth daily. 15 tablet 0  . pioglitazone (ACTOS) 45 MG tablet TAKE 1 TABLET BY MOUTH EVERY DAY 30 tablet 12  . traZODone (DESYREL) 100 MG tablet Take 1 tablet (100 mg total) by mouth at bedtime. 90 tablet 1  . Vilazodone  HCl (VIIBRYD) 40 MG TABS Take 1 tablet (40 mg total) by mouth every morning. 90 tablet 1  . paliperidone (INVEGA SUSTENNA) 156 MG/ML SUSP injection Inject 1 mL (156 mg total) into the muscle once. 1 mL 0  . paliperidone (INVEGA SUSTENNA) 234 MG/1.5ML SUSP injection Inject 234 mg into the muscle once. 1.5 mL 0   No current facility-administered medications for this visit.     Previous Psychotropic Medications:  Patient reported that she has previously tried Risperdal and Seroquel and Zyprexa Paxil and Celexa. She reported that she has history of adverse reaction to Abilify when she had hives. She stated that she has never tried Depakote or lithium and Tegretol. She denied any history of suicide attempts in the past.  Substance Abuse History in the last 12 months:  No.  Consequences of Substance Abuse: Negative NA  Medical Decision Making:  Review of Psycho-Social Stressors (1) and Established Problem, Worsening (2)  Treatment Plan Summary: Medication management   Paranoia Discontinue Geodon Continue Invega 6 mg daily for 1 week She  will receive Invega Sustenna 234 mg IM today Will give her a prescription of  Invega Sustenna 156 mg  and she will follow-up next week.  Discussed with the patient about the side effects of medications in  detail and she agreed with the plan  Advised patient that if she notices any worsening of her symptoms she should contact us immediately and she demonstrated understanding. Continue Depakote 250 mg in the morning and 500 at bedtime. She will continue on Viibryd 40 mg daily  She has enough supply of benztropine at home. Continue  trazodone 100 mg by mouth daily at bedtime when necessary for insomnia  Patient will follow-up in one week to get the second dose of the injection She was also given the lab requisition form to have her baseline labs done including the prolactin level      More than 50% of the time spent in psychoeducation, counseling and coordination of care.     This note was generated in part or whole with voice recognition software. Voice regonition is usually quite accurate but there are transcription errors that can and very often do occur. I apologize for any typographical errors that were not detected and corrected.    Brandy HaleUzma Taniesha Glanz, MD    11/28/201710:43 AM

## 2016-03-26 ENCOUNTER — Other Ambulatory Visit: Payer: Self-pay | Admitting: Psychiatry

## 2016-03-27 LAB — CBC
Hematocrit: 32.5 % — ABNORMAL LOW (ref 34.0–46.6)
Hemoglobin: 11.2 g/dL (ref 11.1–15.9)
MCH: 28.1 pg (ref 26.6–33.0)
MCHC: 34.5 g/dL (ref 31.5–35.7)
MCV: 82 fL (ref 79–97)
Platelets: 170 10*3/uL (ref 150–379)
RBC: 3.98 x10E6/uL (ref 3.77–5.28)
RDW: 15.9 % — ABNORMAL HIGH (ref 12.3–15.4)
WBC: 5.4 10*3/uL (ref 3.4–10.8)

## 2016-03-27 LAB — COMPREHENSIVE METABOLIC PANEL
ALT: 15 IU/L (ref 0–32)
AST: 14 IU/L (ref 0–40)
Albumin/Globulin Ratio: 2.2 (ref 1.2–2.2)
Albumin: 4.4 g/dL (ref 3.5–5.5)
Alkaline Phosphatase: 68 IU/L (ref 39–117)
BUN/Creatinine Ratio: 13 (ref 9–23)
BUN: 9 mg/dL (ref 6–24)
Bilirubin Total: 0.4 mg/dL (ref 0.0–1.2)
CO2: 23 mmol/L (ref 18–29)
Calcium: 9.7 mg/dL (ref 8.7–10.2)
Chloride: 95 mmol/L — ABNORMAL LOW (ref 96–106)
Creatinine, Ser: 0.68 mg/dL (ref 0.57–1.00)
GFR calc Af Amer: 124 mL/min/{1.73_m2} (ref >59)
GFR calc non Af Amer: 107 mL/min/{1.73_m2} (ref >59)
Globulin, Total: 2 g/dL (ref 1.5–4.5)
Glucose: 127 mg/dL — ABNORMAL HIGH (ref 65–99)
Potassium: 4.3 mmol/L (ref 3.5–5.2)
Sodium: 138 mmol/L (ref 134–144)
Total Protein: 6.4 g/dL (ref 6.0–8.5)

## 2016-03-27 LAB — LIPID PANEL W/O CHOL/HDL RATIO
Cholesterol, Total: 156 mg/dL (ref 100–199)
HDL: 48 mg/dL (ref >39)
LDL Calculated: 63 mg/dL (ref 0–99)
Triglycerides: 227 mg/dL — ABNORMAL HIGH (ref 0–149)
VLDL Cholesterol Cal: 45 mg/dL — ABNORMAL HIGH (ref 5–40)

## 2016-03-27 LAB — PROLACTIN: Prolactin: 36.2 ng/mL — ABNORMAL HIGH (ref 4.8–23.3)

## 2016-03-27 LAB — TSH: TSH: 2.5 u[IU]/mL (ref 0.450–4.500)

## 2016-04-01 ENCOUNTER — Ambulatory Visit (INDEPENDENT_AMBULATORY_CARE_PROVIDER_SITE_OTHER): Payer: 59

## 2016-04-01 ENCOUNTER — Encounter: Payer: Self-pay | Admitting: Psychiatry

## 2016-04-01 ENCOUNTER — Telehealth: Payer: Self-pay

## 2016-04-01 ENCOUNTER — Ambulatory Visit (INDEPENDENT_AMBULATORY_CARE_PROVIDER_SITE_OTHER): Payer: 59 | Admitting: Psychiatry

## 2016-04-01 VITALS — BP 128/85 | HR 84 | Temp 98.1°F | Wt 237.2 lb

## 2016-04-01 DIAGNOSIS — F251 Schizoaffective disorder, depressive type: Secondary | ICD-10-CM

## 2016-04-01 DIAGNOSIS — F429 Obsessive-compulsive disorder, unspecified: Secondary | ICD-10-CM | POA: Diagnosis not present

## 2016-04-01 MED ORDER — PALIPERIDONE PALMITATE 156 MG/ML IM SUSP: 156.0000 mg | Freq: Once | INTRAMUSCULAR | 0 refills | Status: DC

## 2016-04-01 MED ORDER — PALIPERIDONE PALMITATE 156 MG/ML IM SUSP
156.0000 mg | Freq: Once | INTRAMUSCULAR | Status: AC
  Administered 2016-04-01: 156 mg via INTRAMUSCULAR

## 2016-04-01 NOTE — Progress Notes (Signed)
Psychiatric Follow up MD/NP Note   Patient Identification: Tabitha [name] MRN:  [id number] Date of Evaluation:  04/01/2016 Referral Source: [facility].  Chief Complaint:   Chief Complaint    Follow-up; Medication Refill     Visit Diagnosis:    ICD-9-CM ICD-10-CM   1. Schizoaffective disorder, depressive type (HCC) 295.70 F25.1   2. Obsessive-compulsive disorder, unspecified type 300.3 F42.9    Diagnosis:   Patient Active Problem List   Diagnosis Date Noted  . Obesity [E66.9] 09/24/2015  . DM (diabetes mellitus) (HCC) [E11.9] 12/03/2014  . HBP (high blood pressure) [I10] 12/03/2014  . Hyperlipidemia [E78.5] 12/03/2014  . Bipolar disorder in partial remission (HCC) [F31.70] 12/03/2014  . GERD without esophagitis [K21.9] 12/03/2014   History of Present Illness:    Patient is a 45 year-old married female who presented for follow-up.Patient reported that she has started taking the [place] And has started improving. She has already received injection last week. Patient reported that she has noticed improvement in her paranoia and she feels that the cameras are slowly going away. Patient reported that she does not have any side effects of the injection at this time. She wakes up with improved mood. She has noticed slightly improvement in her symptoms. She appeared calm and alert during the interview. She does not have any adverse reactions from the injection and does not have any muscle movements. We did do aims testing on her.  Her labs were also came back and her prolactin level was slightly elevated. However she does not have any side effects related to the same. Patient is compliant with her medications. She took the [place] pill this morning but I advised her to stop taking the medications and she agreed with the plan.   She currently denied having any suicidal ideations or plans.   Patient reported that she slept well last night..  Past Medical  History:  Past Medical History:  Diagnosis Date  . Asthma   . Depression   . Diabetes mellitus without complication (HCC)   . Diabetes mellitus, type II (HCC)   . GERD (gastroesophageal reflux disease)   . Mild sleep apnea     Past Surgical History:  Procedure Laterality Date  . CESAREAN SECTION  2006/2008  . CHOLECYSTECTOMY  2006   Family History:  Family History  Problem Relation Age of Onset  . Diabetes Father   . Hyperlipidemia Father   . Diabetes Brother   . Lupus Mother    Social History:   Social History   Social History  . Marital status: Married    Spouse name: N/A  . Number of children: N/A  . Years of education: N/A   Social History Main Topics  . Smoking status: Never Smoker  . Smokeless tobacco: Never Used  . Alcohol use No  . Drug use: No  . Sexual activity: Yes    Partners: Male    Birth control/ protection: None   Other Topics Concern  . None   Social History Narrative  . None   Additional Social History:  Married x 11 years. Has a good relationship with husband and has a 67 and 63 yo. 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 She stated that she does not smoke cigarettes and does not use any drugs or alcohol   Musculoskeletal: Strength & Muscle Tone: within normal limits Gait & Station: normal Patient leans: N/A  Psychiatric Specialty Exam: Medication Refill  Pertinent negatives include  no congestion or coughing.  Anxiety  Symptoms include insomnia and nervous/anxious behavior.      Review of Systems  Constitutional: Positive for malaise/fatigue. Negative for weight loss.  HENT: Negative for congestion and hearing loss.   Eyes: Negative for photophobia.  Respiratory: Negative for cough and sputum production.   Genitourinary: Negative for urgency.  Psychiatric/Behavioral: Positive for depression and hallucinations. The patient is nervous/anxious and has insomnia.   All other systems reviewed and are negative.    Blood pressure 128/85, pulse 84, temperature 98.1 F (36.7 C), temperature source Oral, weight 237 lb 3.2 oz (107.6 kg).Body mass index is 42.69 kg/m.  General Appearance: Casual and Fairly Groomed  Eye Contact:  Fair  Speech:  Clear and Coherent  Volume:  Normal  Mood:  Anxious and Depressed  Affect:  Congruent  Thought Process:  Coherent  Orientation:  Full (Time, Place, and Person)  Thought Content:  Obsessions and Paranoid Ideation  Suicidal Thoughts:  No  Homicidal Thoughts:  No  Memory:  Immediate;   Fair  Judgement:  Fair  Insight:  Fair  Psychomotor Activity:  Decreased  Concentration:  Fair  Recall:  [employer]  Fund of Knowledge:Fair  Language: Fair  Akathisia:  No  Handed:  Right  AIMS (if indicated):  none  Assets:  Communication Skills Desire for Improvement Housing Intimacy Physical Health Social Support  ADL's:  Intact  Cognition: WNL  Sleep:  6-7    Is the patient at risk to self?  No. Has the patient been a risk to self in the past 6 months?  No. Has the patient been a risk to self within the distant past?  No. Is the patient a risk to others?  No. Has the patient been a risk to others in the past 6 months?  No. Has the patient been a risk to others within the distant past?  No.  Allergies:   Allergies  Allergen Reactions  . Abilify [Aripiprazole] Hives   Current Medications: Current Outpatient Prescriptions  Medication Sig Dispense Refill  . ACCU-CHEK AVIVA PLUS test strip TEST BLOOD SUGAR ONCE DAILY 100 each 12  . albuterol (PROVENTIL HFA;VENTOLIN HFA) 108 (90 Base) MCG/ACT inhaler Inhale 2 puffs into the lungs every 6 (six) hours as needed for wheezing or shortness of breath. 1 Inhaler 2  . atorvastatin (LIPITOR) 20 MG tablet TAKE 1 TABLET BY MOUTH EVERY NIGHT AT BEDTIME 30 tablet 12  . divalproex (DEPAKOTE) 250 MG DR tablet Take 1 tablet (250 mg total) by mouth every morning. 90 tablet 1  . divalproex (DEPAKOTE) 500 MG DR tablet Take 1 tablet  (500 mg total) by mouth 1 day or 1 dose. 90 tablet 1  . doxycycline (VIBRAMYCIN) 50 MG capsule   0  . Insulin Detemir (LEVEMIR) 100 UNIT/ML Pen Inject 40 Units into the skin 2 (two) times daily. 15 mL 11  . Insulin Pen Needle (BD PEN NEEDLE NANO U/F) 32G X 4 MM MISC 1 each by Does not apply route daily. 100 each 5  . lisinopril (PRINIVIL,ZESTRIL) 2.5 MG tablet Take 1 tablet (2.5 mg total) by mouth daily. 30 tablet 12  . metformin (FORTAMET) 1000 MG (OSM) 24 hr tablet Take 1,000 mg by mouth 2 (two) times daily.  12  . NOVOTWIST 32G X 5 MM MISC USE WITH LEVEMIR PEN BID  5  . omeprazole (PRILOSEC) 10 MG capsule TAKE 1 CAPSULE BY MOUTH EVERY DAY 30 capsule 12  . paliperidone (INVEGA) 6 MG 24 hr tablet  Take 1 tablet (6 mg total) by mouth daily. 15 tablet 0  . pioglitazone (ACTOS) 45 MG tablet TAKE 1 TABLET BY MOUTH EVERY DAY 30 tablet 12  . traZODone (DESYREL) 100 MG tablet Take 1 tablet (100 mg total) by mouth at bedtime. 90 tablet 1  . Vilazodone HCl (VIIBRYD) 40 MG TABS Take 1 tablet (40 mg total) by mouth every morning. 90 tablet 1  . paliperidone (INVEGA SUSTENNA) 156 MG/ML SUSP injection Inject 1 mL (156 mg total) into the muscle once. 1 mL 0  . paliperidone (INVEGA SUSTENNA) 156 MG/ML SUSP injection Inject 1 mL (156 mg total) into the muscle once. 1 mL 0   Current Facility-Administered Medications  Medication Dose Route Frequency Provider Last Rate Last Dose  . paliperidone (INVEGA SUSTENNA) injection 156 mg  156 mg Intramuscular Once Brandy HaleUzma Tamey Wanek, MD        Previous Psychotropic Medications:  Patient reported that she has previously tried Risperdal and Seroquel and Zyprexa Paxil and Celexa. She reported that she has history of adverse reaction to Abilify when she had hives. She stated that she has never tried Depakote or lithium and Tegretol. She denied any history of suicide attempts in the past.  Substance Abuse History in the last 12 months:  No.  Consequences of Substance  Abuse: Negative NA  Medical Decision Making:  Review of Psycho-Social Stressors (1) and Established Problem, Worsening (2)  Treatment Plan Summary: Medication management   Discontinue  Invega  She  will receive Invega Sustenna 156  mg IM today Will give her a prescription of  Invega Sustenna 156 mg  and she will follow-up in 1 month. .  Discussed with the patient about the side effects of medications in detail and she agreed with the plan  Advised patient that if she notices any worsening of her symptoms she should contact us immediately and she demonstrated understanding. Continue Depakote 250 mg in the morning and 500 at bedtime. She will continue on Viibryd 40 mg daily  She has enough supply of medications.  Continue  trazodone 100 mg by mouth daily at bedtime when necessary for insomnia  Patient will follow-up in one month  to get the second dose of the injection. Next appointment scheduled for January 4      More than 50% of the time spent in psychoeducation, counseling and coordination of care.     This note was generated in part or whole with voice recognition software. Voice regonition is usually quite accurate but there are transcription errors that can and very often do occur. I apologize for any typographical errors that were not detected and corrected.    Brandy HaleUzma Maysoon Lozada, MD    12/6/201711:15 AM

## 2016-04-01 NOTE — Telephone Encounter (Signed)
Face-to-face with patient after her evaluation with Dr. Faheem to assist with due Invega Sustenna 156 mg IM injection.  Patient had no problems with initial injection and presents with no auditory or visual hallucinations, improved paranoid ideations and no suicidal or homicidal ideations. Difficulty finding nursing documentation in [facility] so patient's due Sustenna 156 mg/ml IM injection prepared as ordered and given to patient in her left upper outer gluteal area.  Patient tolerated injection without complaint of pain or discomfort and agreed to return in one month for next injection as Dr. Faheem stated she wanted to continue with 156 mg/ml dosage in one month then.  Patient scheduled next due injection for 04/30/16 at 11:00am.  Patient given Invega Sustenna 156 mg/ml IM injecton with NCD # 50458-563-01, lot # [id number]. Exp. Date 10/20018.   

## 2016-04-02 NOTE — Progress Notes (Signed)
Face-to-face with patient after her evaluation with Dr. Garnetta BuddyFaheem to assist with due Invega Sustenna 156 mg IM injection.  Patient had no problems with initial injection and presents with no auditory or visual hallucinations, improved paranoid ideations and no suicidal or homicidal ideations. Difficulty finding nursing documentation in [facility] so patient's due Sustenna 156 mg/ml IM injection prepared as ordered and given to patient in her left upper outer gluteal area.  Patient tolerated injection without complaint of pain or discomfort and agreed to return in one month for next injection as Dr. Garnetta BuddyFaheem stated she wanted to continue with 156 mg/ml dosage in one month then.  Patient scheduled next due injection for 04/30/16 at 11:00am.  Patient given Invega Sustenna 156 mg/ml IM injecton with NCD # [id number], lot # [id number]. Exp. Date 10/20018.

## 2016-04-13 ENCOUNTER — Other Ambulatory Visit: Payer: Self-pay | Admitting: Psychiatry

## 2016-04-30 ENCOUNTER — Ambulatory Visit: Payer: Medicare Other

## 2016-04-30 ENCOUNTER — Ambulatory Visit: Payer: Medicare Other | Admitting: Psychiatry

## 2016-05-06 ENCOUNTER — Ambulatory Visit (INDEPENDENT_AMBULATORY_CARE_PROVIDER_SITE_OTHER): Payer: 59

## 2016-05-06 ENCOUNTER — Ambulatory Visit (INDEPENDENT_AMBULATORY_CARE_PROVIDER_SITE_OTHER): Payer: 59 | Admitting: Psychiatry

## 2016-05-06 ENCOUNTER — Encounter: Payer: Self-pay | Admitting: Psychiatry

## 2016-05-06 VITALS — BP 117/75 | HR 80 | Temp 98.0°F | Wt 239.6 lb

## 2016-05-06 DIAGNOSIS — F251 Schizoaffective disorder, depressive type: Secondary | ICD-10-CM | POA: Diagnosis not present

## 2016-05-06 DIAGNOSIS — F429 Obsessive-compulsive disorder, unspecified: Secondary | ICD-10-CM | POA: Diagnosis not present

## 2016-05-06 DIAGNOSIS — F3170 Bipolar disorder, currently in remission, most recent episode unspecified: Secondary | ICD-10-CM

## 2016-05-06 MED ORDER — PALIPERIDONE PALMITATE 156 MG/ML IM SUSP
156.0000 mg | Freq: Once | INTRAMUSCULAR | Status: AC
  Administered 2016-05-06: 156 mg via INTRAMUSCULAR

## 2016-05-06 MED ORDER — TRAZODONE HCL 100 MG PO TABS: 100.0000 mg | ORAL_TABLET | Freq: Every day | ORAL | 1 refills | Status: DC

## 2016-05-06 MED ORDER — DIVALPROEX SODIUM 500 MG PO DR TAB: 500.0000 mg | DELAYED_RELEASE_TABLET | ORAL | 1 refills | Status: DC

## 2016-05-06 MED ORDER — PALIPERIDONE PALMITATE 234 MG/1.5ML IM SUSP: 234.0000 mg | Freq: Once | INTRAMUSCULAR | 0 refills | Status: DC

## 2016-05-06 MED ORDER — VILAZODONE HCL 40 MG PO TABS: 40.0000 mg | ORAL_TABLET | Freq: Every morning | ORAL | 1 refills | Status: DC

## 2016-05-06 MED ORDER — DIVALPROEX SODIUM 250 MG PO DR TAB: 250.0000 mg | DELAYED_RELEASE_TABLET | Freq: Every morning | ORAL | 1 refills | Status: DC

## 2016-05-06 NOTE — Progress Notes (Signed)
Patient brought medication.  invega sustenna 156mg  was given to patient in the right ventrogluteal area. Pt is doing well on medication.  NDC # [id number]   S/N # [id number] exp # [id number] lot # [id number] GTIN:# [id number]

## 2016-05-06 NOTE — Progress Notes (Signed)
Psychiatric Follow up MD/NP Note   Patient Identification: Tabitha [name] MRN:  [id number] Date of Evaluation:  05/06/2016 Referral Source: [facility].  Chief Complaint:   Chief Complaint    Follow-up; Medication Refill; Injections     Visit Diagnosis:    ICD-9-CM ICD-10-CM   1. Schizoaffective disorder, depressive type (HCC) 295.70 F25.1   2. Obsessive-compulsive disorder, unspecified type 300.3 F42.9    Diagnosis:   Patient Active Problem List   Diagnosis Date Noted  . Obesity [E66.9] 09/24/2015  . DM (diabetes mellitus) (HCC) [E11.9] 12/03/2014  . HBP (high blood pressure) [I10] 12/03/2014  . Hyperlipidemia [E78.5] 12/03/2014  . Bipolar disorder in partial remission (HCC) [F31.70] 12/03/2014  . GERD without esophagitis [K21.9] 12/03/2014   History of Present Illness:    Patient is a 46 year-old married female who presented for follow-up.Patient reported that she has started taking the [place] and has started improving. She Reported that the cameras are gone but she has noticed as she feels that there is some nonspecific going on against her by her family members including her husband. She reported that she thinks that her husband is plotting against her. She appeared calm and alert during the interview. She reported that this is new and she is aware of the same. However she is becoming more energetic. Patient reported that the medication is helping her. We discussed about her medications. She is sleeping well with the help of trazodone. Patient is compliant with her medications. She reported that her husband has also noticed that her symptoms are improving. She currently denied having any side effects of the medication. We discussed about increasing the dose of Invega Sustenna 234 at her next visit and she agreed with the plan. She appeared calm and alert during the interview. She denied having any side effects of the medications.   She  has already  stopped the Seroquel and invega by mouth medications.   She currently denied having any suicidal ideations or plans.   Patient reported that she slept well last night..  Past Medical History:  Past Medical History:  Diagnosis Date  . Asthma   . Depression   . Diabetes mellitus without complication (HCC)   . Diabetes mellitus, type II (HCC)   . GERD (gastroesophageal reflux disease)   . Mild sleep apnea     Past Surgical History:  Procedure Laterality Date  . CESAREAN SECTION  2006/2008  . CHOLECYSTECTOMY  2006   Family History:  Family History  Problem Relation Age of Onset  . Diabetes Father   . Hyperlipidemia Father   . Diabetes Brother   . Lupus Mother    Social History:   Social History   Social History  . Marital status: Married    Spouse name: N/A  . Number of children: N/A  . Years of education: N/A   Social History Main Topics  . Smoking status: Never Smoker  . Smokeless tobacco: Never Used  . Alcohol use No  . Drug use: No  . Sexual activity: Yes    Partners: Male    Birth control/ protection: None   Other Topics Concern  . None   Social History Narrative  . None   Additional Social History:  Married x 11 years. Has a good relationship with husband and has a 107 and 34 yo. 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 She stated that she does not smoke cigarettes and does not use any drugs  or alcohol   Musculoskeletal: Strength & Muscle Tone: within normal limits Gait & Station: normal Patient leans: N/A  Psychiatric Specialty Exam: Medication Refill  Pertinent negatives include no congestion or coughing.  Anxiety       Review of Systems  Constitutional: Positive for malaise/fatigue. Negative for weight loss.  HENT: Negative for congestion and hearing loss.   Eyes: Negative for photophobia.  Respiratory: Negative for cough and sputum production.   Genitourinary: Negative for urgency.  Psychiatric/Behavioral: Positive for  depression and hallucinations.  All other systems reviewed and are negative.   Blood pressure 117/75, pulse 80, temperature 98 F (36.7 C), temperature source Oral, weight 239 lb 9.6 oz (108.7 kg).Body mass index is 43.13 kg/m.  General Appearance: Casual and Fairly Groomed  Eye Contact:  Fair  Speech:  Clear and Coherent  Volume:  Normal  Mood:  Anxious and Depressed  Affect:  Congruent  Thought Process:  Coherent  Orientation:  Full (Time, Place, and Person)  Thought Content:  Obsessions and Paranoid Ideation  Suicidal Thoughts:  No  Homicidal Thoughts:  No  Memory:  Immediate;   Fair  Judgement:  Fair  Insight:  Fair  Psychomotor Activity:  Decreased  Concentration:  Fair  Recall:  [employer] of Knowledge:Fair  Language: Fair  Akathisia:  No  Handed:  Right  AIMS (if indicated):  none  Assets:  Communication Skills Desire for Improvement Housing Intimacy Physical Health Social Support  ADL's:  Intact  Cognition: WNL  Sleep:  6-7    Is the patient at risk to self?  No. Has the patient been a risk to self in the past 6 months?  No. Has the patient been a risk to self within the distant past?  No. Is the patient a risk to others?  No. Has the patient been a risk to others in the past 6 months?  No. Has the patient been a risk to others within the distant past?  No.  Allergies:   Allergies  Allergen Reactions  . Abilify [Aripiprazole] Hives   Current Medications: Current Outpatient Prescriptions  Medication Sig Dispense Refill  . ACCU-CHEK AVIVA PLUS test strip TEST BLOOD SUGAR ONCE DAILY 100 each 12  . albuterol (PROVENTIL HFA;VENTOLIN HFA) 108 (90 Base) MCG/ACT inhaler Inhale 2 puffs into the lungs every 6 (six) hours as needed for wheezing or shortness of breath. 1 Inhaler 2  . atorvastatin (LIPITOR) 20 MG tablet TAKE 1 TABLET BY MOUTH EVERY NIGHT AT BEDTIME 30 tablet 12  . divalproex (DEPAKOTE) 250 MG DR tablet Take 1 tablet (250 mg total) by mouth every  morning. 90 tablet 1  . divalproex (DEPAKOTE) 500 MG DR tablet Take 1 tablet (500 mg total) by mouth 1 day or 1 dose. 90 tablet 1  . doxycycline (VIBRAMYCIN) 50 MG capsule   0  . Insulin Detemir (LEVEMIR) 100 UNIT/ML Pen Inject 40 Units into the skin 2 (two) times daily. 15 mL 11  . Insulin Pen Needle (BD PEN NEEDLE NANO U/F) 32G X 4 MM MISC 1 each by Does not apply route daily. 100 each 5  . lisinopril (PRINIVIL,ZESTRIL) 2.5 MG tablet Take 1 tablet (2.5 mg total) by mouth daily. 30 tablet 12  . metformin (FORTAMET) 1000 MG (OSM) 24 hr tablet Take 1,000 mg by mouth 2 (two) times daily.  12  . NOVOTWIST 32G X 5 MM MISC USE WITH LEVEMIR PEN BID  5  . omeprazole (PRILOSEC) 10 MG capsule TAKE 1 CAPSULE BY  MOUTH EVERY DAY 30 capsule 12  . paliperidone (INVEGA) 6 MG 24 hr tablet Take 1 tablet (6 mg total) by mouth daily. 15 tablet 0  . pioglitazone (ACTOS) 45 MG tablet TAKE 1 TABLET BY MOUTH EVERY DAY 30 tablet 12  . traZODone (DESYREL) 100 MG tablet Take 1 tablet (100 mg total) by mouth at bedtime. 90 tablet 1  . Vilazodone HCl (VIIBRYD) 40 MG TABS Take 1 tablet (40 mg total) by mouth every morning. 90 tablet 1  . paliperidone (INVEGA SUSTENNA) 156 MG/ML SUSP injection Inject 1 mL (156 mg total) into the muscle once. 1 mL 0  . paliperidone (INVEGA SUSTENNA) 156 MG/ML SUSP injection Inject 1 mL (156 mg total) into the muscle once. 1 mL 0   No current facility-administered medications for this visit.     Previous Psychotropic Medications:  Patient reported that she has previously tried Risperdal and Seroquel and Zyprexa Paxil and Celexa. She reported that she has history of adverse reaction to Abilify when she had hives. She stated that she has never tried Depakote or lithium and Tegretol. She denied any history of suicide attempts in the past.  Substance Abuse History in the last 12 months:  No.  Consequences of Substance Abuse: Negative NA  Medical Decision Making:  Review of Psycho-Social  Stressors (1) and Established Problem, Worsening (2)  Treatment Plan Summary: Medication management   Discontinue  Invega  She  will receive Invega Sustenna 156  mg IM today Will give her a prescription of  Invega Sustenna  234 mg  and she will follow-up in 1 month. .  Discussed with the patient about the side effects of medications in detail and she agreed with the plan  Advised patient that if she notices any worsening of her symptoms she should contact us immediately and she demonstrated understanding. Continue Depakote 250 mg in the morning and 500 at bedtime. She will continue on Viibryd 40 mg daily  Medication refilled  Continue  trazodone 100 mg by mouth daily at bedtime when necessary for insomnia.    More than 50% of the time spent in psychoeducation, counseling and coordination of care.     This note was generated in part or whole with voice recognition software. Voice regonition is usually quite accurate but there are transcription errors that can and very often do occur. I apologize for any typographical errors that were not detected and corrected.    Brandy HaleUzma Carron Mcmurry, MD    1/10/20189:18 AM

## 2016-05-08 ENCOUNTER — Ambulatory Visit (INDEPENDENT_AMBULATORY_CARE_PROVIDER_SITE_OTHER): Payer: Medicare Other | Admitting: Family Medicine

## 2016-05-08 ENCOUNTER — Encounter: Payer: Self-pay | Admitting: Family Medicine

## 2016-05-08 VITALS — BP 130/72 | HR 86 | Temp 98.3°F | Resp 16 | Ht 62.5 in | Wt 237.6 lb

## 2016-05-08 DIAGNOSIS — H1032 Unspecified acute conjunctivitis, left eye: Secondary | ICD-10-CM | POA: Diagnosis not present

## 2016-05-08 DIAGNOSIS — H1013 Acute atopic conjunctivitis, bilateral: Secondary | ICD-10-CM

## 2016-05-08 MED ORDER — POLYMYXIN B-TRIMETHOPRIM 10000-0.1 UNIT/ML-% OP SOLN: 1.0000 [drp] | OPHTHALMIC | 0 refills | Status: DC

## 2016-05-08 MED ORDER — OLOPATADINE HCL 0.2 % OP SOLN: OPHTHALMIC | 2 refills | Status: DC

## 2016-05-08 NOTE — Patient Instructions (Signed)
Thank you for coming in to clinic today.  1. I think that this is a Viral Conjunctivitis, less likely to be bacterial. Also you probably have some allergic component - Start antibiotic eye drops Poly Trim every 4 hours 1 drop in left eye for next 7-10 days, to cover for potential bacteria - Also sent rx for Olopatadine anti-histamine anti allergy eye drops for both eyes once daily - Resume Loratadine (Claritin) 10mg  daily everyday for now, maybe for 4-6 weeks or longer as needed - Use warm compress or warm wash cloth in morning and several times a day to help ease drainage and itching - Avoid rubbing eye - Wash hands well avoid spread  Return sooner if redness or swelling of eyelid skin, thicker pus drainage, fevers/chills, loss of vision or pain  Please schedule a follow-up appointment with Dr. Althea CharonKaramalegos / Dr Juanetta GoslingHawkins within 1-2 if conjunctivitis not improved  If you have any other questions or concerns, please feel free to call the clinic or send a message through MyChart. You may also schedule an earlier appointment if necessary.  Saralyn PilarAlexander Azael Ragain, DO [facility]

## 2016-05-08 NOTE — Assessment & Plan Note (Addendum)
Suspected contributing factor to eye itching and irritation, has history of other allergies in past. - Trial on Olopatadine may use as needed or regularly, in addition to oral anti-histamine with Zyrtec/Loratadine

## 2016-05-08 NOTE — Progress Notes (Signed)
Subjective:    Patient ID: Tabitha [name], female    DOB: [birth year], 46 y.o.   MRN: [id number]  Tabitha [name] is a 46 y.o. female presenting on 05/08/2016 for Conjunctivitis (Left side onset 5 days itchy burning  crusty watery in morning)  Patient presents for a same day appointment.  HPI  Left Eye Conjunctivitis / Bilateral Eye Itching / URI Symptoms - Reports symptoms started about 5 days ago with Left eye redness, itchiness, clear drainage persistently throughout the day but worse in morning. Tried OTC Vizene helps burning, but not resolving. Not used warm compresses. - Admits some itchiness in Right eye but no drainage, also with URI symptoms congestion, occasional diarrhea - Sick contact at home with URI symptoms - Denies any fevers/chills, nausea, vomiting, sinus pain or pressure, ear pain, sore throat, loss of vision, eye pain, redness or facial / eyelid swelling   Social History  Substance Use Topics  . Smoking status: Never Smoker  . Smokeless tobacco: Never Used  . Alcohol use No    Review of Systems Per HPI unless specifically indicated above     Objective:    BP 130/72   Pulse 86   Temp 98.3 F (36.8 C) (Oral)   Resp 16   Ht 5' 2.5" (1.588 m)   Wt 237 lb 9.6 oz (107.8 kg)   BMI 42.77 kg/m   Wt Readings from Last 3 Encounters:  05/08/16 237 lb 9.6 oz (107.8 kg)  01/16/16 226 lb (102.5 kg)  09/24/15 226 lb (102.5 kg)    Physical Exam  Constitutional: She appears well-developed and well-nourished. No distress.  Well-appearing, comfortable, cooperative  HENT:  Head: Normocephalic and atraumatic.  Frontal / maxillary sinuses non-tender. Nares patent without purulence or edema. Bilateral TMs clear without erythema, effusion or bulging. Oropharynx clear without erythema, exudates, edema or asymmetry.  Eyes: EOM are normal. Pupils are equal, round, and reactive to light. Right eye exhibits no discharge. Left eye exhibits discharge (clear watery).  Left  Eye with mild conjunctival injection scattered and injection of lower eyelid, without stye or hordeolum. - Bilateral eyelids normal without edema or erythema - Non tender orbital region  Right eye appears normal.  Neck: Normal range of motion. Neck supple.  Cardiovascular: Normal rate.   Pulmonary/Chest: Effort normal.  Lymphadenopathy:    She has no cervical adenopathy.  Neurological: She is alert.  Skin: Skin is warm and dry. No rash noted. She is not diaphoretic. No erythema.  Nursing note and vitals reviewed.      Assessment & Plan:   Problem List Items Addressed This Visit    Allergic conjunctivitis of both eyes    Suspected contributing factor to eye itching and irritation, has history of other allergies in past. - Trial on Olopatadine may use as needed or regularly, in addition to oral anti-histamine with Zyrtec/Loratadine      Relevant Medications   Olopatadine HCl 0.2 % SOLN    Other Visit Diagnoses    Acute conjunctivitis of left eye, unspecified acute conjunctivitis type    -  Primary  Acute L conjunctivitis for past 5 days, with mild scleral/conjunctival injection with only minor clear discharge currently. Suspected viral vs bacterial etiology with sick contact and associated URI symptoms, also likely allergic component with more clear discharge and itching bilateral. - No visio nloss - No evidence of complication, foreign body, or extending eyelid / pre-septal cellulitis - Inadequate conservative treatment at home  Plan: 1. Reassurance, most likely  self limited, empiric coverage with start Polytrim antibiotic eye drops 1 drop in L  eye every 4 hours for 7-10 days 2. Start moist warm compresses over Left eyelid 10-15 min at a time, up to 4-6 times daily until resolution 3. Frequent hand washing, avoid rubbing / scratching eye 4. Strict return precautions for spreading infection 5. Follow-up 1-2 weeks as needed     Relevant Medications   trimethoprim-polymyxin b  (POLYTRIM) ophthalmic solution      Meds ordered this encounter  Medications  . trimethoprim-polymyxin b (POLYTRIM) ophthalmic solution    Sig: Place 1 drop into the left eye every 4 (four) hours. For 7-10 days    Dispense:  10 mL    Refill:  0  . Olopatadine HCl 0.2 % SOLN    Sig: Use 1 drop in each eye daily for allergic eye symptoms, watery, itchy eyes    Dispense:  2.5 mL    Refill:  2      Follow up plan: Return in about 2 weeks (around 05/22/2016), or if symptoms worsen or fail to improve, for left eye conjunctivitis.   Saralyn PilarAlexander Karamalegos, DO [facility] 05/08/2016, 1:16 PM

## 2016-05-15 ENCOUNTER — Telehealth: Payer: Self-pay | Admitting: Family Medicine

## 2016-05-15 DIAGNOSIS — H1033 Unspecified acute conjunctivitis, bilateral: Secondary | ICD-10-CM

## 2016-05-15 MED ORDER — ERYTHROMYCIN 5 MG/GM OP OINT: 1.0000 "application " | TOPICAL_OINTMENT | OPHTHALMIC | 0 refills | Status: DC

## 2016-05-15 NOTE — Telephone Encounter (Signed)
Last seen by me on 05/08/16 for left eye acute conjunctivitis, but bilateral eye itching, see chart for full details, suspected likely viral vs allergic etiology, but given empiric poly trim antibiotic eye drops for left eye.  Patient called office today for advice, she has had some mild interval improvement in Left eye, but now spreading "pink eye" to right eye, still has discharge and crusting etc. Requesting different antibiotic.  Advised patient that it is possible she still has viral conjunctivitis which is why spread to other eye and may not be responding to the antibiotic eye drops, she should continue with anti-allergy treatments and warm compresses, will send in erythromycin antibiotic ophthalmic ointment to use q 4 hour or 6 times daily for 7 days in both eyes for now to provide coverage.  She may follow-up with me next week, or if dramatic change with vision loss, severe eye redness pain, facial redness swelling, needs to go to ED for more immediate evaluation. If persistent or recurrent conjunctivitis still not improving after a week, we can consider ophthalmology referral for second opinion.  Tabitha PilarAlexander [name], DO [name] 05/15/2016, 1:16 PM

## 2016-05-26 ENCOUNTER — Ambulatory Visit: Payer: Self-pay | Admitting: Family Medicine

## 2016-06-03 ENCOUNTER — Ambulatory Visit (INDEPENDENT_AMBULATORY_CARE_PROVIDER_SITE_OTHER): Payer: 59 | Admitting: Psychiatry

## 2016-06-03 ENCOUNTER — Other Ambulatory Visit: Payer: Self-pay | Admitting: Family Medicine

## 2016-06-03 ENCOUNTER — Encounter: Payer: Self-pay | Admitting: Psychiatry

## 2016-06-03 ENCOUNTER — Ambulatory Visit (INDEPENDENT_AMBULATORY_CARE_PROVIDER_SITE_OTHER): Payer: 59

## 2016-06-03 VITALS — BP 115/72 | HR 89 | Temp 98.5°F | Wt 238.6 lb

## 2016-06-03 DIAGNOSIS — F429 Obsessive-compulsive disorder, unspecified: Secondary | ICD-10-CM

## 2016-06-03 DIAGNOSIS — F250 Schizoaffective disorder, bipolar type: Secondary | ICD-10-CM | POA: Diagnosis not present

## 2016-06-03 DIAGNOSIS — F3170 Bipolar disorder, currently in remission, most recent episode unspecified: Secondary | ICD-10-CM | POA: Diagnosis not present

## 2016-06-03 MED ORDER — PALIPERIDONE PALMITATE 234 MG/1.5ML IM SUSP
234.0000 mg | Freq: Once | INTRAMUSCULAR | Status: AC
  Administered 2016-06-03: 234 mg via INTRAMUSCULAR

## 2016-06-03 NOTE — Progress Notes (Signed)
Psychiatric Follow up MD/NP Note   Patient Identification: Tabitha [name] MRN:  [id number] Date of Evaluation:  06/03/2016 Referral Source: [facility].  Chief Complaint:   Chief Complaint    Follow-up; Medication Refill     Visit Diagnosis:    ICD-9-CM ICD-10-CM   1. Schizoaffective disorder, bipolar type (HCC) 295.70 F25.0   2. Obsessive-compulsive disorder, unspecified type 300.3 F42.9    Diagnosis:   Patient Active Problem List   Diagnosis Date Noted  . Allergic conjunctivitis of both eyes [H10.13] 05/08/2016  . Obesity [E66.9] 09/24/2015  . DM (diabetes mellitus) (HCC) [E11.9] 12/03/2014  . HBP (high blood pressure) [I10] 12/03/2014  . Hyperlipidemia [E78.5] 12/03/2014  . Bipolar disorder in partial remission (HCC) [F31.70] 12/03/2014  . GERD without esophagitis [K21.9] 12/03/2014   History of Present Illness:    Patient is a 46 year-old married female who presented for follow-up.Patient reported that she has started taking the [place] and And is again having hallucinations. She reported that she feels that the church people are following her and has been conspiring against her. She reported that she filed that the [place] initially helped her. She is interested in going higher on the dose. She brought the [place] injection 234 mg.   She has been living with her family members and her husband remains supportive.   She reported that she sleeps well at night.  She has been compliant with her medications. She stated that she has been taking her medications on a regular basis and will have her labs done with her primary care physician office.  She currently denied having any suicidal ideations or plans.    Past Medical History:  Past Medical History:  Diagnosis Date  . Asthma   . Depression   . Diabetes mellitus without complication (HCC)   . Diabetes mellitus, type II (HCC)   . GERD (gastroesophageal reflux disease)   . Mild sleep  apnea     Past Surgical History:  Procedure Laterality Date  . CESAREAN SECTION  2006/2008  . CHOLECYSTECTOMY  2006   Family History:  Family History  Problem Relation Age of Onset  . Diabetes Father   . Hyperlipidemia Father   . Diabetes Brother   . Lupus Mother    Social History:   Social History   Social History  . Marital status: Married    Spouse name: N/A  . Number of children: N/A  . Years of education: N/A   Social History Main Topics  . Smoking status: Never Smoker  . Smokeless tobacco: Never Used  . Alcohol use No  . Drug use: No  . Sexual activity: Yes    Partners: Male    Birth control/ protection: None   Other Topics Concern  . None   Social History Narrative  . None   Additional Social History:  Married x 11 years. Has a good relationship with husband and has a 72 and 43 yo. 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 She stated that she does not smoke cigarettes and does not use any drugs or alcohol   Musculoskeletal: Strength & Muscle Tone: within normal limits Gait & Station: normal Patient leans: N/A  Psychiatric Specialty Exam: Medication Refill  Pertinent negatives include no congestion or coughing.  Anxiety       Review of Systems  Constitutional: Positive for malaise/fatigue. Negative for weight loss.  HENT: Negative for congestion and hearing loss.   Eyes: Negative for  photophobia.  Respiratory: Negative for cough and sputum production.   Genitourinary: Negative for urgency.  Psychiatric/Behavioral: Positive for depression and hallucinations.  All other systems reviewed and are negative.   Blood pressure 115/72, pulse 89, temperature 98.5 F (36.9 C), temperature source Oral, weight 238 lb 9.6 oz (108.2 kg).Body mass index is 42.95 kg/m.  General Appearance: Casual and Fairly Groomed  Eye Contact:  Fair  Speech:  Clear and Coherent  Volume:  Normal  Mood:  Anxious and Depressed  Affect:  Congruent  Thought  Process:  Coherent  Orientation:  Full (Time, Place, and Person)  Thought Content:  Obsessions and Paranoid Ideation  Suicidal Thoughts:  No  Homicidal Thoughts:  No  Memory:  Immediate;   Fair  Judgement:  Fair  Insight:  Fair  Psychomotor Activity:  Decreased  Concentration:  Fair  Recall:  [employer]Fair  Fund of Knowledge:Fair  Language: Fair  Akathisia:  No  Handed:  Right  AIMS (if indicated):  none  Assets:  Communication Skills Desire for Improvement Housing Intimacy Physical Health Social Support  ADL's:  Intact  Cognition: WNL  Sleep:  6-7    Is the patient at risk to self?  No. Has the patient been a risk to self in the past 6 months?  No. Has the patient been a risk to self within the distant past?  No. Is the patient a risk to others?  No. Has the patient been a risk to others in the past 6 months?  No. Has the patient been a risk to others within the distant past?  No.  Allergies:   Allergies  Allergen Reactions  . Abilify [Aripiprazole] Hives   Current Medications: Current Outpatient Prescriptions  Medication Sig Dispense Refill  . ACCU-CHEK AVIVA PLUS test strip TEST BLOOD SUGAR ONCE DAILY 100 each 12  . atorvastatin (LIPITOR) 20 MG tablet TAKE 1 TABLET BY MOUTH EVERY NIGHT AT BEDTIME 30 tablet 12  . divalproex (DEPAKOTE) 250 MG DR tablet Take 1 tablet (250 mg total) by mouth every morning. 90 tablet 1  . divalproex (DEPAKOTE) 500 MG DR tablet Take 1 tablet (500 mg total) by mouth 1 day or 1 dose. 90 tablet 1  . doxycycline (VIBRAMYCIN) 50 MG capsule   0  . erythromycin ophthalmic ointment Place 1 application into both eyes every 4 (four) hours. For 1 week. 3.5 g 0  . Insulin Detemir (LEVEMIR) 100 UNIT/ML Pen Inject 40 Units into the skin 2 (two) times daily. 15 mL 11  . Insulin Pen Needle (BD PEN NEEDLE NANO U/F) 32G X 4 MM MISC 1 each by Does not apply route daily. 100 each 5  . lisinopril (PRINIVIL,ZESTRIL) 2.5 MG tablet Take 1 tablet (2.5 mg total) by mouth  daily. 30 tablet 12  . metformin (FORTAMET) 1000 MG (OSM) 24 hr tablet Take 1,000 mg by mouth 2 (two) times daily.  12  . NOVOTWIST 32G X 5 MM MISC USE WITH LEVEMIR PEN BID  5  . Olopatadine HCl 0.2 % SOLN Use 1 drop in each eye daily for allergic eye symptoms, watery, itchy eyes 2.5 mL 2  . omeprazole (PRILOSEC) 10 MG capsule TAKE 1 CAPSULE BY MOUTH EVERY DAY 30 capsule 12  . paliperidone (INVEGA SUSTENNA) 156 MG/ML SUSP injection Inject 1 mL (156 mg total) into the muscle once. 1 mL 0  . paliperidone (INVEGA SUSTENNA) 156 MG/ML SUSP injection Inject 1 mL (156 mg total) into the muscle once. 1 mL 0  . paliperidone (  INVEGA SUSTENNA) 234 MG/1.5ML SUSP injection Inject 234 mg into the muscle once. 1.5 mL 0  . pioglitazone (ACTOS) 45 MG tablet TAKE 1 TABLET BY MOUTH EVERY DAY 30 tablet 12  . PROAIR HFA 108 (90 Base) MCG/ACT inhaler INHALE 2 PUFFS INTO THE LUNGS EVERY 6 HOURS AS NEEDED FOR WHEEZING OR SHORTNESS OF BREATH 8.5 g 6  . traZODone (DESYREL) 100 MG tablet Take 1 tablet (100 mg total) by mouth at bedtime. 90 tablet 1  . Vilazodone HCl (VIIBRYD) 40 MG TABS Take 1 tablet (40 mg total) by mouth every morning. 90 tablet 1   No current facility-administered medications for this visit.     Previous Psychotropic Medications:  Patient reported that she has previously tried Risperdal and Seroquel and Zyprexa Paxil and Celexa. She reported that she has history of adverse reaction to Abilify when she had hives. She stated that she has never tried Depakote or lithium and Tegretol. She denied any history of suicide attempts in the past.  Substance Abuse History in the last 12 months:  No.  Consequences of Substance Abuse: Negative NA  Medical Decision Making:  Review of Psycho-Social Stressors (1) and Established Problem, Worsening (2)  Treatment Plan Summary: Medication management   Discontinue  Invega  She  will receive Invega Sustenna 156  mg IM today Will give her a prescription of   Invega Sustenna  234 mg  and she will follow-up in 2 weeks and if her symptoms continue to get worse will start giving her injection on every 3 weeks.  Discussed with the patient about the side effects of medications in detail and she agreed with the plan  Advised patient that if she notices any worsening of her symptoms she should contact us immediately and she demonstrated understanding. Continue Depakote 250 mg in the morning and 500 at bedtime. She will continue on Viibryd 40 mg daily  Patient has enough supply of the medications. Continue  trazodone 100 mg by mouth daily at bedtime when necessary for insomnia.    More than 50% of the time spent in psychoeducation, counseling and coordination of care.     This note was generated in part or whole with voice recognition software. Voice regonition is usually quite accurate but there are transcription errors that can and very often do occur. I apologize for any typographical errors that were not detected and corrected.    Brandy Hale, MD    2/7/20181:23 PM

## 2016-06-15 ENCOUNTER — Ambulatory Visit (INDEPENDENT_AMBULATORY_CARE_PROVIDER_SITE_OTHER): Payer: Medicare Other | Admitting: Family Medicine

## 2016-06-15 ENCOUNTER — Encounter: Payer: Self-pay | Admitting: Family Medicine

## 2016-06-15 VITALS — BP 118/61 | HR 75 | Temp 97.7°F | Resp 16 | Ht 62.5 in | Wt 238.0 lb

## 2016-06-15 DIAGNOSIS — J45909 Unspecified asthma, uncomplicated: Secondary | ICD-10-CM | POA: Insufficient documentation

## 2016-06-15 DIAGNOSIS — F3170 Bipolar disorder, currently in remission, most recent episode unspecified: Secondary | ICD-10-CM | POA: Diagnosis not present

## 2016-06-15 DIAGNOSIS — K219 Gastro-esophageal reflux disease without esophagitis: Secondary | ICD-10-CM

## 2016-06-15 DIAGNOSIS — E784 Other hyperlipidemia: Secondary | ICD-10-CM

## 2016-06-15 DIAGNOSIS — E119 Type 2 diabetes mellitus without complications: Secondary | ICD-10-CM

## 2016-06-15 DIAGNOSIS — E0800 Diabetes mellitus due to underlying condition with hyperosmolarity without nonketotic hyperglycemic-hyperosmolar coma (NKHHC): Secondary | ICD-10-CM

## 2016-06-15 DIAGNOSIS — L719 Rosacea, unspecified: Secondary | ICD-10-CM | POA: Insufficient documentation

## 2016-06-15 DIAGNOSIS — Z794 Long term (current) use of insulin: Secondary | ICD-10-CM

## 2016-06-15 DIAGNOSIS — E7849 Other hyperlipidemia: Secondary | ICD-10-CM

## 2016-06-15 DIAGNOSIS — J4520 Mild intermittent asthma, uncomplicated: Secondary | ICD-10-CM

## 2016-06-15 MED ORDER — DOXYCYCLINE HYCLATE 50 MG PO CAPS: 50.0000 mg | ORAL_CAPSULE | Freq: Every morning | ORAL | 12 refills | Status: DC

## 2016-06-15 MED ORDER — INSULIN DETEMIR 100 UNIT/ML FLEXPEN: 40.0000 [IU] | PEN_INJECTOR | Freq: Two times a day (BID) | SUBCUTANEOUS | 11 refills | Status: DC

## 2016-06-15 MED ORDER — BECLOMETHASONE DIPROPIONATE 40 MCG/ACT IN AERS: 1.0000 | INHALATION_SPRAY | Freq: Two times a day (BID) | RESPIRATORY_TRACT | 12 refills | Status: DC

## 2016-06-15 NOTE — Progress Notes (Signed)
Name: Tabitha [name]   MRN: [id number]    DOB: Nov 19, 1970   Date:06/15/2016       Progress Note  Subjective  Chief Complaint  Chief Complaint  Patient presents with  . Diabetes    HPI Here for f/u of DM and asthma.  Sees Psych for her depression (bipolar disorder).  She is taking meds but is gaining weight (? Due to Psych meds).  Overall feeling pretty well.    No problem-specific Assessment & Plan notes found for this encounter.   Past Medical History:  Diagnosis Date  . Asthma   . Depression   . Diabetes mellitus without complication (HCC)   . Diabetes mellitus, type II (HCC)   . GERD (gastroesophageal reflux disease)   . Mild sleep apnea     Past Surgical History:  Procedure Laterality Date  . CESAREAN SECTION  2006/2008  . CHOLECYSTECTOMY  2006    Family History  Problem Relation Age of Onset  . Diabetes Father   . Hyperlipidemia Father   . Diabetes Brother   . Lupus Mother     Social History   Social History  . Marital status: Married    Spouse name: N/A  . Number of children: N/A  . Years of education: N/A   Occupational History  . Not on file.   Social History Main Topics  . Smoking status: Never Smoker  . Smokeless tobacco: Never Used  . Alcohol use No  . Drug use: No  . Sexual activity: Yes    Partners: Male    Birth control/ protection: None   Other Topics Concern  . Not on file   Social History Narrative  . No narrative on file     Current Outpatient Prescriptions:  .  ACCU-CHEK AVIVA PLUS test strip, TEST BLOOD SUGAR ONCE DAILY, Disp: 100 each, Rfl: 12 .  atorvastatin (LIPITOR) 20 MG tablet, TAKE 1 TABLET BY MOUTH EVERY NIGHT AT BEDTIME, Disp: 30 tablet, Rfl: 12 .  divalproex (DEPAKOTE) 250 MG DR tablet, Take 1 tablet (250 mg total) by mouth every morning., Disp: 90 tablet, Rfl: 1 .  divalproex (DEPAKOTE) 500 MG DR tablet, Take 1 tablet (500 mg total) by mouth 1 day or 1 dose., Disp: 90 tablet, Rfl: 1 .  doxycycline (VIBRAMYCIN)  50 MG capsule, Take 1 capsule (50 mg total) by mouth every morning., Disp: 30 capsule, Rfl: 12 .  erythromycin ophthalmic ointment, Place 1 application into both eyes every 4 (four) hours. For 1 week., Disp: 3.5 g, Rfl: 0 .  Insulin Detemir (LEVEMIR) 100 UNIT/ML Pen, Inject 40 Units into the skin 2 (two) times daily., Disp: 5 pen, Rfl: 11 .  Insulin Pen Needle (BD PEN NEEDLE NANO U/F) 32G X 4 MM MISC, 1 each by Does not apply route daily., Disp: 100 each, Rfl: 5 .  lisinopril (PRINIVIL,ZESTRIL) 2.5 MG tablet, Take 1 tablet (2.5 mg total) by mouth daily., Disp: 30 tablet, Rfl: 12 .  metformin (FORTAMET) 1000 MG (OSM) 24 hr tablet, Take 1,000 mg by mouth 2 (two) times daily., Disp: , Rfl: 12 .  NOVOTWIST 32G X 5 MM MISC, USE WITH LEVEMIR PEN BID, Disp: , Rfl: 5 .  Olopatadine HCl 0.2 % SOLN, Use 1 drop in each eye daily for allergic eye symptoms, watery, itchy eyes, Disp: 2.5 mL, Rfl: 2 .  omeprazole (PRILOSEC) 10 MG capsule, TAKE 1 CAPSULE BY MOUTH EVERY DAY, Disp: 30 capsule, Rfl: 12 .  pioglitazone (ACTOS) 45 MG tablet, TAKE  1 TABLET BY MOUTH EVERY DAY, Disp: 30 tablet, Rfl: 12 .  PROAIR HFA 108 (90 Base) MCG/ACT inhaler, INHALE 2 PUFFS INTO THE LUNGS EVERY 6 HOURS AS NEEDED FOR WHEEZING OR SHORTNESS OF BREATH, Disp: 8.5 g, Rfl: 6 .  traZODone (DESYREL) 100 MG tablet, Take 1 tablet (100 mg total) by mouth at bedtime., Disp: 90 tablet, Rfl: 1 .  Vilazodone HCl (VIIBRYD) 40 MG TABS, Take 1 tablet (40 mg total) by mouth every morning., Disp: 90 tablet, Rfl: 1 .  beclomethasone (QVAR) 40 MCG/ACT inhaler, Inhale 1 puff into the lungs 2 (two) times daily. Rinse mouth with water after use., Disp: 1 Inhaler, Rfl: 12 .  paliperidone (INVEGA SUSTENNA) 234 MG/1.5ML SUSP injection, Inject 234 mg into the muscle once., Disp: 1.5 mL, Rfl: 0  Allergies  Allergen Reactions  . Abilify [Aripiprazole] Hives     Review of Systems  Constitutional: Negative for chills, fever, malaise/fatigue and weight loss.   HENT: Negative for hearing loss and tinnitus.   Eyes: Negative for blurred vision and double vision.  Respiratory: Positive for wheezing (with weather changes.). Negative for cough and shortness of breath.   Cardiovascular: Negative for chest pain, palpitations and leg swelling.  Gastrointestinal: Positive for diarrhea (yesterday only.). Negative for abdominal pain, blood in stool, heartburn and nausea.  Genitourinary: Negative for dysuria, frequency and urgency.  Musculoskeletal: Negative for joint pain and myalgias.  Skin: Negative for rash.       Acne Rosacea  Neurological: Negative for dizziness, tingling, tremors, weakness and headaches.      Objective  Vitals:   06/15/16 1324  BP: 118/61  Pulse: 75  Resp: 16  Temp: 97.7 F (36.5 C)  TempSrc: Oral  Weight: 238 lb (108 kg)  Height: 5' 2.5" (1.588 m)    Physical Exam  Constitutional: She is oriented to person, place, and time and well-developed, well-nourished, and in no distress. No distress.  HENT:  Head: Normocephalic and atraumatic.  Eyes: Conjunctivae and EOM are normal. Pupils are equal, round, and reactive to light. No scleral icterus.  Neck: Normal range of motion. Neck supple. Carotid bruit is not present. No thyromegaly present.  Cardiovascular: Normal rate, regular rhythm and normal heart sounds.  Exam reveals no gallop and no friction rub.   No murmur heard. Pulmonary/Chest: Effort normal and breath sounds normal. No respiratory distress. She has no wheezes. She has no rales.  Abdominal: Soft. Bowel sounds are normal. She exhibits no distension and no mass. There is no tenderness.  Musculoskeletal: She exhibits no edema.  Lymphadenopathy:    She has no cervical adenopathy.  Neurological: She is alert and oriented to person, place, and time.  Vitals reviewed.      Recent Results (from the past 2160 hour(s))  Comprehensive metabolic panel     Status: Abnormal   Collection Time: 03/26/16 12:45 PM   Result Value Ref Range   Glucose 127 (H) 65 - 99 mg/dL   BUN 9 6 - 24 mg/dL   Creatinine, Ser 8.11 0.57 - 1.00 mg/dL   GFR calc non Af Amer 107 >59 mL/min/1.73   GFR calc Af Amer 124 >59 mL/min/1.73   BUN/Creatinine Ratio 13 9 - 23   Sodium 138 134 - 144 mmol/L   Potassium 4.3 3.5 - 5.2 mmol/L   Chloride 95 (L) 96 - 106 mmol/L   CO2 23 18 - 29 mmol/L   Calcium 9.7 8.7 - 10.2 mg/dL   Total Protein 6.4 6.0 - 8.5 g/dL  Albumin 4.4 3.5 - 5.5 g/dL   Globulin, Total 2.0 1.5 - 4.5 g/dL   Albumin/Globulin Ratio 2.2 1.2 - 2.2   Bilirubin Total 0.4 0.0 - 1.2 mg/dL   Alkaline Phosphatase 68 39 - 117 IU/L   AST 14 0 - 40 IU/L   ALT 15 0 - 32 IU/L  CBC     Status: Abnormal   Collection Time: 03/26/16 12:45 PM  Result Value Ref Range   WBC 5.4 3.4 - 10.8 x10E3/uL   RBC 3.98 3.77 - 5.28 x10E6/uL   Hemoglobin 11.2 11.1 - 15.9 g/dL    Comment: **Effective March 30, 2016 the reference interval**   for Hemoglobin MALES only will be changing to:                         Males 13-15 years: 12.6 - 17.7                         Males   >15 years: 13.0 - 17.7    Hematocrit 32.5 (L) 34.0 - 46.6 %   MCV 82 79 - 97 fL   MCH 28.1 26.6 - 33.0 pg   MCHC 34.5 31.5 - 35.7 g/dL   RDW 16.1 (H) 09.6 - 04.5 %   Platelets 170 150 - 379 x10E3/uL  Lipid Panel w/o Chol/HDL Ratio     Status: Abnormal   Collection Time: 03/26/16 12:45 PM  Result Value Ref Range   Cholesterol, Total 156 100 - 199 mg/dL   Triglycerides 409 (H) 0 - 149 mg/dL   HDL 48 >81 mg/dL   VLDL Cholesterol Cal 45 (H) 5 - 40 mg/dL   LDL Calculated 63 0 - 99 mg/dL  TSH     Status: None   Collection Time: 03/26/16 12:45 PM  Result Value Ref Range   TSH 2.500 0.450 - 4.500 uIU/mL  Prolactin     Status: Abnormal   Collection Time: 03/26/16 12:45 PM  Result Value Ref Range   Prolactin 36.2 (H) 4.8 - 23.3 ng/mL     Assessment & Plan  Problem List Items Addressed This Visit      Respiratory   Asthma   Relevant Medications    beclomethasone (QVAR) 40 MCG/ACT inhaler     Digestive   GERD without esophagitis     Endocrine   DM (diabetes mellitus) (HCC) - Primary   Relevant Medications   Insulin Detemir (LEVEMIR) 100 UNIT/ML Pen   Other Relevant Orders   HgB A1c     Musculoskeletal and Integument   Acne rosacea   Relevant Medications   doxycycline (VIBRAMYCIN) 50 MG capsule     Other   Hyperlipidemia   Bipolar disorder in partial remission (HCC)    Other Visit Diagnoses    Diabetes mellitus without complication (HCC)       Relevant Medications   Insulin Detemir (LEVEMIR) 100 UNIT/ML Pen      Meds ordered this encounter  Medications  . beclomethasone (QVAR) 40 MCG/ACT inhaler    Sig: Inhale 1 puff into the lungs 2 (two) times daily. Rinse mouth with water after use.    Dispense:  1 Inhaler    Refill:  12  . doxycycline (VIBRAMYCIN) 50 MG capsule    Sig: Take 1 capsule (50 mg total) by mouth every morning.    Dispense:  30 capsule    Refill:  12  . Insulin Detemir (LEVEMIR) 100 UNIT/ML Pen  Sig: Inject 40 Units into the skin 2 (two) times daily.    Dispense:  5 pen    Refill:  11   1. Diabetes mellitus due to underlying condition with hyperosmolarity without coma, with long-term current use of insulin (HCC) Cont Metformin and Actos - Insulin Detemir (LEVEMIR) 100 UNIT/ML Pen; Inject 40 Units into the skin 2 (two) times daily.  Dispense: 5 pen; Refill: 11 - HgB A1c  2. GERD without esophagitis Cont Omeprazole  3. Other hyperlipidemia Cont  Lipitor  4. Bipolar disorder in partial remission, most recent episode unspecified type (HCC) Cont to see Psych and take meds as he directs.  5. Mild intermittent asthma without complication  - beclomethasone (QVAR) 40 MCG/ACT inhaler; Inhale 1 puff into the lungs 2 (two) times daily. Rinse mouth with water after use.  Dispense: 1 Inhaler; Refill: 12  6. Acne rosacea  - doxycycline (VIBRAMYCIN) 50 MG capsule; Take 1 capsule (50 mg total) by  mouth every morning.  Dispense: 30 capsule; Refill: 12  7. Type 2 diabetes mellitus without complication, without long-term current use of insulin (HCC)  - Insulin Detemir (LEVEMIR) 100 UNIT/ML Pen; Inject 40 Units into the skin 2 (two) times daily.  Dispense: 5 pen; Refill: 11  8. Diabetes mellitus without complication (HCC)  - Insulin Detemir (LEVEMIR) 100 UNIT/ML Pen; Inject 40 Units into the skin 2 (two) times daily.  Dispense: 5 pen; Refill: 11

## 2016-06-18 ENCOUNTER — Encounter: Payer: Self-pay | Admitting: Psychiatry

## 2016-06-18 DIAGNOSIS — E0800 Diabetes mellitus due to underlying condition with hyperosmolarity without nonketotic hyperglycemic-hyperosmolar coma (NKHHC): Secondary | ICD-10-CM | POA: Diagnosis not present

## 2016-06-18 DIAGNOSIS — Z794 Long term (current) use of insulin: Secondary | ICD-10-CM | POA: Diagnosis not present

## 2016-06-19 LAB — HEMOGLOBIN A1C
ESTIMATED AVERAGE GLUCOSE: 143
HEMOGLOBIN A1C: 6.6 % — AB (ref 4.8–5.6)

## 2016-06-22 ENCOUNTER — Telehealth: Payer: Self-pay

## 2016-06-22 NOTE — Telephone Encounter (Signed)
Lab results reviewed. Her Prolactin level is 38.8, Glucose, 132.  Hgb- 10.8 HCt-32.1 [id number]   Will follow up.

## 2016-06-22 NOTE — Telephone Encounter (Signed)
pt labwork result from [facility].

## 2016-06-22 NOTE — Telephone Encounter (Signed)
Did you speak with patient?

## 2016-06-23 ENCOUNTER — Telehealth: Payer: Self-pay | Admitting: Psychiatry

## 2016-06-24 ENCOUNTER — Ambulatory Visit: Payer: Medicare Other | Admitting: Psychiatry

## 2016-06-24 ENCOUNTER — Other Ambulatory Visit: Payer: Self-pay | Admitting: Psychiatry

## 2016-06-24 ENCOUNTER — Ambulatory Visit: Payer: Medicare Other

## 2016-06-24 ENCOUNTER — Other Ambulatory Visit: Payer: Self-pay | Admitting: Family Medicine

## 2016-06-24 DIAGNOSIS — I10 Essential (primary) hypertension: Secondary | ICD-10-CM

## 2016-06-24 MED ORDER — PALIPERIDONE PALMITATE 234 MG/1.5ML IM SUSP: 234.0000 mg | Freq: Once | INTRAMUSCULAR | 0 refills | Status: DC

## 2016-06-24 NOTE — Telephone Encounter (Signed)
Called and at first they could not find patient with that specimen # so they did find her by last name and dob.  Correct specimen # is  [id number].  And the code was approved and they will reprocess.

## 2016-06-24 NOTE — Telephone Encounter (Signed)
F25.0

## 2016-06-24 NOTE — Progress Notes (Unsigned)
nvega

## 2016-06-29 ENCOUNTER — Other Ambulatory Visit: Payer: Self-pay | Admitting: Family Medicine

## 2016-06-29 ENCOUNTER — Other Ambulatory Visit: Payer: Self-pay | Admitting: *Deleted

## 2016-06-29 MED ORDER — FLUTICASONE PROPIONATE HFA 110 MCG/ACT IN AERO: 1.0000 | INHALATION_SPRAY | Freq: Two times a day (BID) | RESPIRATORY_TRACT | 12 refills | Status: DC

## 2016-07-01 ENCOUNTER — Encounter: Payer: Self-pay | Admitting: Psychiatry

## 2016-07-01 ENCOUNTER — Ambulatory Visit: Payer: Medicare Other

## 2016-07-01 ENCOUNTER — Ambulatory Visit (INDEPENDENT_AMBULATORY_CARE_PROVIDER_SITE_OTHER): Payer: Medicare Other

## 2016-07-01 ENCOUNTER — Ambulatory Visit (INDEPENDENT_AMBULATORY_CARE_PROVIDER_SITE_OTHER): Payer: 59 | Admitting: Psychiatry

## 2016-07-01 VITALS — BP 138/85 | HR 102 | Temp 98.3°F | Wt 236.2 lb

## 2016-07-01 DIAGNOSIS — F429 Obsessive-compulsive disorder, unspecified: Secondary | ICD-10-CM

## 2016-07-01 DIAGNOSIS — F250 Schizoaffective disorder, bipolar type: Secondary | ICD-10-CM

## 2016-07-01 MED ORDER — PALIPERIDONE PALMITATE 234 MG/1.5ML IM SUSP
234.0000 mg | Freq: Once | INTRAMUSCULAR | Status: AC
  Administered 2016-07-01: 234 mg via INTRAMUSCULAR

## 2016-07-01 MED ORDER — PALIPERIDONE PALMITATE 234 MG/1.5ML IM SUSP: INTRAMUSCULAR | 0 refills | Status: DC

## 2016-07-01 NOTE — Progress Notes (Signed)
Psychiatric Follow up MD/NP Note   Patient Identification: Tabitha HouseholderKathleen J [name] MRN:  [id number] Date of Evaluation:  07/01/2016 Referral Source: [facility].  Chief Complaint:   Chief Complaint    Follow-up; Medication Refill     Visit Diagnosis:    ICD-9-CM ICD-10-CM   1. Schizoaffective disorder, bipolar type (HCC) 295.70 F25.0   2. Obsessive-compulsive disorder, unspecified type 300.3 F42.9    Diagnosis:   Patient Active Problem List   Diagnosis Date Noted  . Asthma [J45.909] 06/15/2016  . Acne rosacea [L71.9] 06/15/2016  . Allergic conjunctivitis of both eyes [H10.13] 05/08/2016  . Obesity [E66.9] 09/24/2015  . DM (diabetes mellitus) (HCC) [E11.9] 12/03/2014  . HBP (high blood pressure) [I10] 12/03/2014  . Hyperlipidemia [E78.5] 12/03/2014  . Bipolar disorder in partial remission (HCC) [F31.70] 12/03/2014  . GERD without esophagitis [K21.9] 12/03/2014   History of Present Illness:    Patient is a 46 year-old married female who presented for follow-up.Patient reported that she has started Having some paranoia after her husband's cousin passed away last week. She reported that people are staring at her. She reported that usually she feels better initially when she gets the injection but worse and she starts having paranoia again. She reported that most of the month she is doing well. She does not have any suicidal or homicidal ideations. Patient has some delusional thinking. Most of the time she reports that the medications have been helpful. She denied having any thoughts to hurt herself. She is a client with her medications. She denied having any side effects of the medications. We discussed about her lab results as well and she reported that she is not having any adverse reactions related to the prolactin and does not have any side effects at this time. She appeared calm and alert during the interview.  Pt is going to have her [place]vega Sustenna injection  today. She has been living with her family members and her husband remains supportive.   She reported that she sleeps well at night.  She has been compliant with her medications.    Past Medical History:  Past Medical History:  Diagnosis Date  . Asthma   . Depression   . Diabetes mellitus without complication (HCC)   . Diabetes mellitus, type II (HCC)   . GERD (gastroesophageal reflux disease)   . Mild sleep apnea     Past Surgical History:  Procedure Laterality Date  . CESAREAN SECTION  2006/2008  . CHOLECYSTECTOMY  2006   Family History:  Family History  Problem Relation Age of Onset  . Diabetes Father   . Hyperlipidemia Father   . Diabetes Brother   . Lupus Mother    Social History:   Social History   Social History  . Marital status: Married    Spouse name: N/A  . Number of children: N/A  . Years of education: N/A   Social History Main Topics  . Smoking status: Never Smoker  . Smokeless tobacco: Never Used  . Alcohol use No  . Drug use: No  . Sexual activity: Yes    Partners: Male    Birth control/ protection: None   Other Topics Concern  . None   Social History Narrative  . None   Additional Social History:  Married x 11 years. Has a good relationship with husband and has a 6310 and 727 yo. 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 She stated that she does not smoke cigarettes and does  not use any drugs or alcohol   Musculoskeletal: Strength & Muscle Tone: within normal limits Gait & Station: normal Patient leans: N/A  Psychiatric Specialty Exam: Medication Refill  Pertinent negatives include no congestion or coughing.  Anxiety       Review of Systems  Constitutional: Positive for malaise/fatigue. Negative for weight loss.  HENT: Negative for congestion and hearing loss.   Eyes: Negative for photophobia.  Respiratory: Negative for cough and sputum production.   Genitourinary: Negative for urgency.  Psychiatric/Behavioral:  Positive for depression and hallucinations.  All other systems reviewed and are negative.   Blood pressure 138/85, pulse (!) 102, temperature 98.3 F (36.8 C), temperature source Oral, weight 236 lb 3.2 oz (107.1 kg).Body mass index is 42.51 kg/m.  General Appearance: Casual and Fairly Groomed  Eye Contact:  Fair  Speech:  Clear and Coherent  Volume:  Normal  Mood:  Anxious and Depressed  Affect:  Congruent  Thought Process:  Coherent  Orientation:  Full (Time, Place, and Person)  Thought Content:  Obsessions and Paranoid Ideation  Suicidal Thoughts:  No  Homicidal Thoughts:  No  Memory:  Immediate;   Fair  Judgement:  Fair  Insight:  Fair  Psychomotor Activity:  Decreased  Concentration:  Fair  Recall:  [employer] of Knowledge:Fair  Language: Fair  Akathisia:  No  Handed:  Right  AIMS (if indicated):  none  Assets:  Communication Skills Desire for Improvement Housing Intimacy Physical Health Social Support  ADL's:  Intact  Cognition: WNL  Sleep:  6-7    Is the patient at risk to self?  No. Has the patient been a risk to self in the past 6 months?  No. Has the patient been a risk to self within the distant past?  No. Is the patient a risk to others?  No. Has the patient been a risk to others in the past 6 months?  No. Has the patient been a risk to others within the distant past?  No.  Allergies:   Allergies  Allergen Reactions  . Abilify [Aripiprazole] Hives   Current Medications: Current Outpatient Prescriptions  Medication Sig Dispense Refill  . ACCU-CHEK AVIVA PLUS test strip TEST BLOOD SUGAR ONCE DAILY 100 each 12  . atorvastatin (LIPITOR) 20 MG tablet TAKE 1 TABLET BY MOUTH EVERY NIGHT AT BEDTIME 30 tablet 12  . divalproex (DEPAKOTE) 250 MG DR tablet Take 1 tablet (250 mg total) by mouth every morning. 90 tablet 1  . divalproex (DEPAKOTE) 500 MG DR tablet Take 1 tablet (500 mg total) by mouth 1 day or 1 dose. 90 tablet 1  . doxycycline (VIBRAMYCIN) 50 MG  capsule Take 1 capsule (50 mg total) by mouth every morning. 30 capsule 12  . erythromycin ophthalmic ointment Place 1 application into both eyes every 4 (four) hours. For 1 week. 3.5 g 0  . fluticasone (FLOVENT HFA) 110 MCG/ACT inhaler Inhale 1 puff into the lungs 2 (two) times daily. 1 Inhaler 12  . Insulin Detemir (LEVEMIR) 100 UNIT/ML Pen Inject 40 Units into the skin 2 (two) times daily. 5 pen 11  . Insulin Pen Needle (BD PEN NEEDLE NANO U/F) 32G X 4 MM MISC 1 each by Does not apply route daily. 100 each 5  . lisinopril (PRINIVIL,ZESTRIL) 2.5 MG tablet Take 1 tablet (2.5 mg total) by mouth daily. 30 tablet 12  . metformin (FORTAMET) 1000 MG (OSM) 24 hr tablet Take 1,000 mg by mouth 2 (two) times daily.  12  .  NOVOTWIST 32G X 5 MM MISC USE WITH LEVEMIR PEN BID  5  . Olopatadine HCl 0.2 % SOLN Use 1 drop in each eye daily for allergic eye symptoms, watery, itchy eyes 2.5 mL 2  . omeprazole (PRILOSEC) 10 MG capsule TAKE 1 CAPSULE BY MOUTH EVERY DAY 30 capsule 12  . paliperidone (INVEGA SUSTENNA) 234 MG/1.5ML SUSP injection ADMINISTER 1.5 ML IN THE MUSCLE 1 TIME 1.5 mL 0  . pioglitazone (ACTOS) 45 MG tablet TAKE 1 TABLET BY MOUTH EVERY DAY 30 tablet 12  . PROAIR HFA 108 (90 Base) MCG/ACT inhaler INHALE 2 PUFFS INTO THE LUNGS EVERY 6 HOURS AS NEEDED FOR WHEEZING OR SHORTNESS OF BREATH 8.5 g 6  . traZODone (DESYREL) 100 MG tablet Take 1 tablet (100 mg total) by mouth at bedtime. 90 tablet 1  . Vilazodone HCl (VIIBRYD) 40 MG TABS Take 1 tablet (40 mg total) by mouth every morning. 90 tablet 1  . paliperidone (INVEGA SUSTENNA) 234 MG/1.5ML SUSP injection Inject 234 mg into the muscle once. 1.5 mL 0   Current Facility-Administered Medications  Medication Dose Route Frequency Provider Last Rate Last Dose  . paliperidone (INVEGA SUSTENNA) injection 234 mg  234 mg Intramuscular Once Brandy Hale, MD        Previous Psychotropic Medications:  Patient reported that she has previously tried Risperdal and  Seroquel and Zyprexa Paxil and Celexa. She reported that she has history of adverse reaction to Abilify when she had hives. She stated that she has never tried Depakote or lithium and Tegretol. She denied any history of suicide attempts in the past.  Substance Abuse History in the last 12 months:  No.  Consequences of Substance Abuse: Negative NA  Medical Decision Making:  Review of Psycho-Social Stressors (1) and Established Problem, Worsening (2)  Treatment Plan Summary: Medication management   Discontinue  Invega  She  will receive Invega Sustenna 234  mg IM today Will give her a prescription of  Invega Sustenna  234 mg  and she will follow-up in 4 weeks  Advised patient that if she notices any worsening of her symptoms she should contact us immediately and she demonstrated understanding. Continue Depakote 250 mg in the morning and 500 at bedtime. She will continue on Viibryd 40 mg daily  Patient has enough supply of the medications. Continue  trazodone 100 mg by mouth daily at bedtime when necessary for insomnia.    More than 50% of the time spent in psychoeducation, counseling and coordination of care.     This note was generated in part or whole with voice recognition software. Voice regonition is usually quite accurate but there are transcription errors that can and very often do occur. I apologize for any typographical errors that were not detected and corrected.    Brandy Hale, MD    3/7/20182:32 PM

## 2016-07-01 NOTE — Progress Notes (Signed)
pt was given invega 234mg  in the rt upper gluteal  lot # [id number] ext 11-2016 [id number]        NDC [id number]

## 2016-07-03 ENCOUNTER — Telehealth: Payer: Self-pay

## 2016-07-03 NOTE — Telephone Encounter (Signed)
Patient was confused. Patient had been informed PA pending for Qvar and if ins would no longer cover, Dr. Juanetta GoslingHawkins would change medication. Patient was informed that Flovent was replacing Qvar.[place]

## 2016-07-03 NOTE — Telephone Encounter (Signed)
Patient called stating that her Qvar has been denied by her insurance.  They gave her an alternative medication that is covered.  Arnuity Ellipta  Please advise patient

## 2016-07-17 ENCOUNTER — Other Ambulatory Visit: Payer: Self-pay | Admitting: Family Medicine

## 2016-07-17 DIAGNOSIS — E119 Type 2 diabetes mellitus without complications: Secondary | ICD-10-CM

## 2016-07-29 ENCOUNTER — Ambulatory Visit (INDEPENDENT_AMBULATORY_CARE_PROVIDER_SITE_OTHER): Payer: Medicare Other

## 2016-07-29 ENCOUNTER — Ambulatory Visit (INDEPENDENT_AMBULATORY_CARE_PROVIDER_SITE_OTHER): Payer: 59 | Admitting: Psychiatry

## 2016-07-29 ENCOUNTER — Encounter: Payer: Self-pay | Admitting: Psychiatry

## 2016-07-29 VITALS — BP 113/74 | HR 94 | Temp 98.3°F | Wt 245.0 lb

## 2016-07-29 DIAGNOSIS — F250 Schizoaffective disorder, bipolar type: Secondary | ICD-10-CM

## 2016-07-29 DIAGNOSIS — F429 Obsessive-compulsive disorder, unspecified: Secondary | ICD-10-CM

## 2016-07-29 MED ORDER — PALIPERIDONE PALMITATE 234 MG/1.5ML IM SUSP
234.0000 mg | Freq: Once | INTRAMUSCULAR | Status: AC
  Administered 2016-07-29: 234 mg via INTRAMUSCULAR

## 2016-07-29 MED ORDER — PALIPERIDONE PALMITATE 234 MG/1.5ML IM SUSP: 234.0000 mg | Freq: Once | INTRAMUSCULAR | 0 refills | Status: DC

## 2016-07-29 NOTE — Progress Notes (Signed)
Psychiatric Follow up MD/NP Note   Patient Identification: Tabitha [name] MRN:  [id number] Date of Evaluation:  07/29/2016 Referral Source: [facility].  Chief Complaint:   Chief Complaint    Follow-up; Medication Refill     Visit Diagnosis:    ICD-9-CM ICD-10-CM   1. Schizoaffective disorder, bipolar type (HCC) 295.70 F25.0   2. Obsessive-compulsive disorder, unspecified type 300.3 F42.9    Diagnosis:   Patient Active Problem List   Diagnosis Date Noted  . Asthma [J45.909] 06/15/2016  . Acne rosacea [L71.9] 06/15/2016  . Allergic conjunctivitis of both eyes [H10.13] 05/08/2016  . Obesity [E66.9] 09/24/2015  . DM (diabetes mellitus) (HCC) [E11.9] 12/03/2014  . HBP (high blood pressure) [I10] 12/03/2014  . Hyperlipidemia [E78.5] 12/03/2014  . Bipolar disorder in partial remission (HCC) [F31.70] 12/03/2014  . GERD without esophagitis [K21.9] 12/03/2014   History of Present Illness:    Patient is a 46 year-old married female who presented for follow-up.Patient reported that she has started Improving since she was started on Invega Sustenna 234 mg IM every 4 weeks. Patient reported that she was initially paranoid for a few days after receiving the injection as she was having persecutory delusions but there are resolved quickly. She reported that she is feeling much better. She reported that she is still home schooling her children and is spending time with her family at home. He discussed about increasing her physical activity as she has gained some weight since she was started on the medication. Patient denied having any side effects of the medication. She reported that she is going to have her physical exam done with her primary care physician and will call them about the same. She appeared more calm and energetic during the interview. She denied having any suicidal homicidal ideations or plans. She reported that her husband is very supportive.   Pt is going to  have her [place] injection today. She has been living with her family members  She has been compliant with her medications.    Past Medical History:  Past Medical History:  Diagnosis Date  . Asthma   . Depression   . Diabetes mellitus without complication (HCC)   . Diabetes mellitus, type II (HCC)   . GERD (gastroesophageal reflux disease)   . Mild sleep apnea     Past Surgical History:  Procedure Laterality Date  . CESAREAN SECTION  2006/2008  . CHOLECYSTECTOMY  2006   Family History:  Family History  Problem Relation Age of Onset  . Diabetes Father   . Hyperlipidemia Father   . Diabetes Brother   . Lupus Mother    Social History:   Social History   Social History  . Marital status: Married    Spouse name: N/A  . Number of children: N/A  . Years of education: N/A   Social History Main Topics  . Smoking status: Never Smoker  . Smokeless tobacco: Never Used  . Alcohol use No  . Drug use: No  . Sexual activity: Yes    Partners: Male    Birth control/ protection: None   Other Topics Concern  . None   Social History Narrative  . None   Additional Social History:  Married x 11 years. Has a good relationship with husband and has a 86 and 55 yo. 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 She stated that she does not smoke cigarettes and does not use any drugs or alcohol   Musculoskeletal: Strength &  Muscle Tone: within normal limits Gait & Station: normal Patient leans: N/A  Psychiatric Specialty Exam: Medication Refill  Pertinent negatives include no congestion or coughing.  Anxiety       Review of Systems  Constitutional: Positive for malaise/fatigue. Negative for weight loss.  HENT: Negative for congestion and hearing loss.   Eyes: Negative for photophobia.  Respiratory: Negative for cough and sputum production.   Genitourinary: Negative for urgency.  Psychiatric/Behavioral: Positive for depression and hallucinations.   All other systems reviewed and are negative.   Blood pressure 113/74, pulse 94, temperature 98.3 F (36.8 C), temperature source Oral, weight 245 lb (111.1 kg).Body mass index is 44.1 kg/m.  General Appearance: Casual and Fairly Groomed  Eye Contact:  Fair  Speech:  Clear and Coherent  Volume:  Normal  Mood:  Anxious and Depressed  Affect:  Congruent  Thought Process:  Coherent  Orientation:  Full (Time, Place, and Person)  Thought Content:  Obsessions and Paranoid Ideation  Suicidal Thoughts:  No  Homicidal Thoughts:  No  Memory:  Immediate;   Fair  Judgement:  Fair  Insight:  Fair  Psychomotor Activity:  Normal  Concentration:  Fair  Recall:  [employer] of Knowledge:Fair  Language: Fair  Akathisia:  No  Handed:  Right  AIMS (if indicated):  none  Assets:  Communication Skills Desire for Improvement Housing Intimacy Physical Health Social Support  ADL's:  Intact  Cognition: WNL  Sleep:  6-7    Is the patient at risk to self?  No. Has the patient been a risk to self in the past 6 months?  No. Has the patient been a risk to self within the distant past?  No. Is the patient a risk to others?  No. Has the patient been a risk to others in the past 6 months?  No. Has the patient been a risk to others within the distant past?  No.  Allergies:   Allergies  Allergen Reactions  . Abilify [Aripiprazole] Hives   Current Medications: Current Outpatient Prescriptions  Medication Sig Dispense Refill  . ACCU-CHEK AVIVA PLUS test strip TEST BLOOD SUGAR ONCE DAILY 100 each 12  . atorvastatin (LIPITOR) 20 MG tablet TAKE 1 TABLET BY MOUTH EVERY NIGHT AT BEDTIME 30 tablet 12  . divalproex (DEPAKOTE) 250 MG DR tablet Take 1 tablet (250 mg total) by mouth every morning. 90 tablet 1  . divalproex (DEPAKOTE) 500 MG DR tablet Take 1 tablet (500 mg total) by mouth 1 day or 1 dose. 90 tablet 1  . doxycycline (VIBRAMYCIN) 50 MG capsule Take 1 capsule (50 mg total) by mouth every  morning. 30 capsule 12  . erythromycin ophthalmic ointment Place 1 application into both eyes every 4 (four) hours. For 1 week. 3.5 g 0  . fluticasone (FLOVENT HFA) 110 MCG/ACT inhaler Inhale 1 puff into the lungs 2 (two) times daily. 1 Inhaler 12  . Insulin Detemir (LEVEMIR) 100 UNIT/ML Pen Inject 40 Units into the skin 2 (two) times daily. 5 pen 11  . Insulin Pen Needle (BD PEN NEEDLE NANO U/F) 32G X 4 MM MISC 1 each by Does not apply route daily. 100 each 5  . lisinopril (PRINIVIL,ZESTRIL) 2.5 MG tablet Take 1 tablet (2.5 mg total) by mouth daily. 30 tablet 12  . metformin (FORTAMET) 1000 MG (OSM) 24 hr tablet Take 1,000 mg by mouth 2 (two) times daily.  12  . metFORMIN (GLUCOPHAGE) 500 MG tablet TAKE 2 TABLETS BY MOUTH TWICE DAILY BEFORE  A MEAL 180 tablet 3  . NOVOTWIST 32G X 5 MM MISC USE WITH LEVEMIR PEN BID  5  . Olopatadine HCl 0.2 % SOLN Use 1 drop in each eye daily for allergic eye symptoms, watery, itchy eyes 2.5 mL 2  . omeprazole (PRILOSEC) 10 MG capsule TAKE 1 CAPSULE BY MOUTH EVERY DAY 30 capsule 12  . paliperidone (INVEGA SUSTENNA) 234 MG/1.5ML SUSP injection ADMINISTER 1.5 ML IN THE MUSCLE 1 TIME 1.5 mL 0  . pioglitazone (ACTOS) 45 MG tablet TAKE 1 TABLET BY MOUTH EVERY DAY 30 tablet 12  . PROAIR HFA 108 (90 Base) MCG/ACT inhaler INHALE 2 PUFFS INTO THE LUNGS EVERY 6 HOURS AS NEEDED FOR WHEEZING OR SHORTNESS OF BREATH 8.5 g 6  . traZODone (DESYREL) 100 MG tablet Take 1 tablet (100 mg total) by mouth at bedtime. 90 tablet 1  . Vilazodone HCl (VIIBRYD) 40 MG TABS Take 1 tablet (40 mg total) by mouth every morning. 90 tablet 1  . paliperidone (INVEGA SUSTENNA) 234 MG/1.5ML SUSP injection Inject 234 mg into the muscle once. 1.5 mL 0   No current facility-administered medications for this visit.     Previous Psychotropic Medications:  Patient reported that she has previously tried Risperdal and Seroquel and Zyprexa Paxil and Celexa. She reported that she has history of adverse  reaction to Abilify when she had hives. She stated that she has never tried Depakote or lithium and Tegretol. She denied any history of suicide attempts in the past.  Substance Abuse History in the last 12 months:  No.  Consequences of Substance Abuse: Negative NA  Medical Decision Making:  Review of Psycho-Social Stressors (1) and Established Problem, Worsening (2)  Treatment Plan Summary: Medication management    She  will receive Invega Sustenna 234  mg IM today Will give her a prescription of  Invega Sustenna  234 mg  and she will follow-up in 4 weeks  Advised patient that if she notices any worsening of her symptoms she should contact us immediately and she demonstrated understanding. Continue Depakote 250 mg in the morning and 500 at bedtime. She will continue on Viibryd 40 mg daily  Patient has enough supply of the medications. Continue  trazodone 100 mg by mouth daily at bedtime when necessary for insomnia.    More than 50% of the time spent in psychoeducation, counseling and coordination of care.     This note was generated in part or whole with voice recognition software. Voice regonition is usually quite accurate but there are transcription errors that can and very often do occur. I apologize for any typographical errors that were not detected and corrected.    Brandy Hale, MD    4/4/20182:37 PM

## 2016-07-29 NOTE — Progress Notes (Signed)
pt was given invega  in the left upper gluteal  lot # [id number] ext 11-2017 [id number]        NDC 44034-742-59

## 2016-08-31 ENCOUNTER — Encounter: Payer: Self-pay | Admitting: Psychiatry

## 2016-08-31 ENCOUNTER — Ambulatory Visit (INDEPENDENT_AMBULATORY_CARE_PROVIDER_SITE_OTHER): Payer: 59 | Admitting: Psychiatry

## 2016-08-31 ENCOUNTER — Ambulatory Visit (INDEPENDENT_AMBULATORY_CARE_PROVIDER_SITE_OTHER): Payer: Medicare Other

## 2016-08-31 VITALS — BP 111/71 | HR 92 | Temp 98.3°F | Wt 239.6 lb

## 2016-08-31 DIAGNOSIS — F250 Schizoaffective disorder, bipolar type: Secondary | ICD-10-CM

## 2016-08-31 DIAGNOSIS — F259 Schizoaffective disorder, unspecified: Secondary | ICD-10-CM

## 2016-08-31 DIAGNOSIS — F3130 Bipolar disorder, current episode depressed, mild or moderate severity, unspecified: Secondary | ICD-10-CM

## 2016-08-31 DIAGNOSIS — F429 Obsessive-compulsive disorder, unspecified: Secondary | ICD-10-CM

## 2016-08-31 DIAGNOSIS — F3170 Bipolar disorder, currently in remission, most recent episode unspecified: Secondary | ICD-10-CM

## 2016-08-31 DIAGNOSIS — F251 Schizoaffective disorder, depressive type: Secondary | ICD-10-CM

## 2016-08-31 MED ORDER — PALIPERIDONE PALMITATE 234 MG/1.5ML IM SUSP: INTRAMUSCULAR | 0 refills | Status: DC

## 2016-08-31 MED ORDER — DIVALPROEX SODIUM 250 MG PO DR TAB: 250.0000 mg | DELAYED_RELEASE_TABLET | Freq: Every morning | ORAL | 1 refills | Status: DC

## 2016-08-31 MED ORDER — TRAZODONE HCL 100 MG PO TABS: 100.0000 mg | ORAL_TABLET | Freq: Every day | ORAL | 1 refills | Status: DC

## 2016-08-31 MED ORDER — PALIPERIDONE PALMITATE 234 MG/1.5ML IM SUSP: 234.0000 mg | Freq: Once | INTRAMUSCULAR | Status: DC

## 2016-08-31 MED ORDER — DIVALPROEX SODIUM 500 MG PO DR TAB: 500.0000 mg | DELAYED_RELEASE_TABLET | ORAL | 1 refills | Status: DC

## 2016-08-31 MED ORDER — VILAZODONE HCL 40 MG PO TABS: 40.0000 mg | ORAL_TABLET | Freq: Every morning | ORAL | 1 refills | Status: DC

## 2016-08-31 MED ORDER — PALIPERIDONE PALMITATE 234 MG/1.5ML IM SUSP
234.0000 mg | Freq: Once | INTRAMUSCULAR | Status: AC
  Administered 2016-08-31: 234 mg via INTRAMUSCULAR

## 2016-08-31 NOTE — Progress Notes (Signed)
pt was given invega 234mg  in the rt upper gluteal    s/n [id number] exp: 01-2018  Lot # [id number]     NDC [id number]

## 2016-08-31 NOTE — Progress Notes (Signed)
Psychiatric Follow up MD/NP Note   Patient Identification: Tabitha [name] MRN:  [id number] Date of Evaluation:  08/31/2016 Referral Source: [facility].  Chief Complaint:   Chief Complaint    Follow-up; Medication Refill     Visit Diagnosis:    ICD-9-CM ICD-10-CM   1. Schizoaffective disorder, bipolar type (HCC) 295.70 F25.0   2. Obsessive-compulsive disorder, unspecified type 300.3 F42.9    Diagnosis:   Patient Active Problem List   Diagnosis Date Noted  . Asthma [J45.909] 06/15/2016  . Acne rosacea [L71.9] 06/15/2016  . Allergic conjunctivitis of both eyes [H10.13] 05/08/2016  . Obesity [E66.9] 09/24/2015  . DM (diabetes mellitus) (HCC) [E11.9] 12/03/2014  . HBP (high blood pressure) [I10] 12/03/2014  . Hyperlipidemia [E78.5] 12/03/2014  . Bipolar disorder in partial remission (HCC) [F31.70] 12/03/2014  . GERD without esophagitis [K21.9] 12/03/2014   History of Present Illness:    Patient is a 46 year-old married female who presented for follow-up.Patient reported that she has started Improving since she was started on Invega Sustenna 234 mg IM every 4 weeks. Patient reported that she feels that she does not have any paranoia and she has been feeling more energetic. She currently denied having any suicidal ideations or plans. She reported that she went to [place] to attend the wedding. She met her family over there. She reported that she does not have any side effects of the injection. She appeared more alert during the interview.   Patient reported that she feels that she sleeps well at night. She denied having any suicidal homicidal ideations or plans. We discussed about her medications in detail.   She is receptive to getting the injections on a monthly basis.   Patient denied having any side effects of the medication. She reported that she is going to have her physical exam done with her primary care physician and will call them about the same.  She appeared more calm and energetic during the interview. She denied having any suicidal homicidal ideations or plans. She reported that her husband is very supportive.   Pt is going to have her [place] injection today. She has been living with her family members  She has been compliant with her medications.    Past Medical History:  Past Medical History:  Diagnosis Date  . Asthma   . Depression   . Diabetes mellitus without complication (HCC)   . Diabetes mellitus, type II (HCC)   . GERD (gastroesophageal reflux disease)   . Mild sleep apnea     Past Surgical History:  Procedure Laterality Date  . CESAREAN SECTION  2006/2008  . CHOLECYSTECTOMY  2006   Family History:  Family History  Problem Relation Age of Onset  . Diabetes Father   . Hyperlipidemia Father   . Diabetes Brother   . Lupus Mother    Social History:   Social History   Social History  . Marital status: Married    Spouse name: N/A  . Number of children: N/A  . Years of education: N/A   Social History Main Topics  . Smoking status: Never Smoker  . Smokeless tobacco: Never Used  . Alcohol use No  . Drug use: No  . Sexual activity: Yes    Partners: Male    Birth control/ protection: None   Other Topics Concern  . None   Social History Narrative  . None   Additional Social History:  Married x 11 years. Has a good relationship with husband and  has a 10 and 80 yo. 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 She stated that she does not smoke cigarettes and does not use any drugs or alcohol   Musculoskeletal: Strength & Muscle Tone: within normal limits Gait & Station: normal Patient leans: N/A  Psychiatric Specialty Exam: Medication Refill  Pertinent negatives include no congestion or coughing.  Anxiety       Review of Systems  Constitutional: Positive for malaise/fatigue. Negative for weight loss.  HENT: Negative for congestion and hearing loss.   Eyes:  Negative for photophobia.  Respiratory: Negative for cough and sputum production.   Genitourinary: Negative for urgency.  Psychiatric/Behavioral: Positive for depression and hallucinations.  All other systems reviewed and are negative.   Blood pressure 111/71, pulse 92, temperature 98.3 F (36.8 C), temperature source Oral, weight 239 lb 9.6 oz (108.7 kg).Body mass index is 43.13 kg/m.  General Appearance: Casual and Fairly Groomed  Eye Contact:  Fair  Speech:  Clear and Coherent  Volume:  Normal  Mood:  Anxious and Depressed  Affect:  Congruent  Thought Process:  Coherent  Orientation:  Full (Time, Place, and Person)  Thought Content:  Obsessions and Paranoid Ideation  Suicidal Thoughts:  No  Homicidal Thoughts:  No  Memory:  Immediate;   Fair  Judgement:  Fair  Insight:  Fair  Psychomotor Activity:  Normal  Concentration:  Fair  Recall:  [employer] of Knowledge:Fair  Language: Fair  Akathisia:  No  Handed:  Right  AIMS (if indicated):  none  Assets:  Communication Skills Desire for Improvement Housing Intimacy Physical Health Social Support  ADL's:  Intact  Cognition: WNL  Sleep:  6-7    Is the patient at risk to self?  No. Has the patient been a risk to self in the past 6 months?  No. Has the patient been a risk to self within the distant past?  No. Is the patient a risk to others?  No. Has the patient been a risk to others in the past 6 months?  No. Has the patient been a risk to others within the distant past?  No.  Allergies:   Allergies  Allergen Reactions  . Abilify [Aripiprazole] Hives   Current Medications: Current Outpatient Prescriptions  Medication Sig Dispense Refill  . ACCU-CHEK AVIVA PLUS test strip TEST BLOOD SUGAR ONCE DAILY 100 each 12  . atorvastatin (LIPITOR) 20 MG tablet TAKE 1 TABLET BY MOUTH EVERY NIGHT AT BEDTIME 30 tablet 12  . divalproex (DEPAKOTE) 250 MG DR tablet Take 1 tablet (250 mg total) by mouth every morning. 90 tablet 1  .  divalproex (DEPAKOTE) 500 MG DR tablet Take 1 tablet (500 mg total) by mouth 1 day or 1 dose. 90 tablet 1  . doxycycline (VIBRAMYCIN) 50 MG capsule Take 1 capsule (50 mg total) by mouth every morning. 30 capsule 12  . erythromycin ophthalmic ointment Place 1 application into both eyes every 4 (four) hours. For 1 week. 3.5 g 0  . fluticasone (FLOVENT HFA) 110 MCG/ACT inhaler Inhale 1 puff into the lungs 2 (two) times daily. 1 Inhaler 12  . Insulin Detemir (LEVEMIR) 100 UNIT/ML Pen Inject 40 Units into the skin 2 (two) times daily. 5 pen 11  . Insulin Pen Needle (BD PEN NEEDLE NANO U/F) 32G X 4 MM MISC 1 each by Does not apply route daily. 100 each 5  . lisinopril (PRINIVIL,ZESTRIL) 2.5 MG tablet Take 1 tablet (2.5 mg total) by mouth  daily. 30 tablet 12  . metformin (FORTAMET) 1000 MG (OSM) 24 hr tablet Take 1,000 mg by mouth 2 (two) times daily.  12  . metFORMIN (GLUCOPHAGE) 500 MG tablet TAKE 2 TABLETS BY MOUTH TWICE DAILY BEFORE A MEAL 180 tablet 3  . NOVOTWIST 32G X 5 MM MISC USE WITH LEVEMIR PEN BID  5  . Olopatadine HCl 0.2 % SOLN Use 1 drop in each eye daily for allergic eye symptoms, watery, itchy eyes 2.5 mL 2  . omeprazole (PRILOSEC) 10 MG capsule TAKE 1 CAPSULE BY MOUTH EVERY DAY 30 capsule 12  . paliperidone (INVEGA SUSTENNA) 234 MG/1.5ML SUSP injection ADMINISTER 1.5 ML IN THE MUSCLE 1 TIME 1.5 mL 0  . pioglitazone (ACTOS) 45 MG tablet TAKE 1 TABLET BY MOUTH EVERY DAY 30 tablet 12  . PROAIR HFA 108 (90 Base) MCG/ACT inhaler INHALE 2 PUFFS INTO THE LUNGS EVERY 6 HOURS AS NEEDED FOR WHEEZING OR SHORTNESS OF BREATH 8.5 g 6  . traZODone (DESYREL) 100 MG tablet Take 1 tablet (100 mg total) by mouth at bedtime. 90 tablet 1  . Vilazodone HCl (VIIBRYD) 40 MG TABS Take 1 tablet (40 mg total) by mouth every morning. 90 tablet 1  . paliperidone (INVEGA SUSTENNA) 234 MG/1.5ML SUSP injection Inject 234 mg into the muscle once. 1.5 mL 0   Current Facility-Administered Medications  Medication Dose  Route Frequency Provider Last Rate Last Dose  . paliperidone (INVEGA SUSTENNA) injection 234 mg  234 mg Intramuscular Once Rainey Pines, MD        Previous Psychotropic Medications:  Patient reported that she has previously tried Risperdal and Seroquel and Zyprexa Paxil and Celexa. She reported that she has history of adverse reaction to Abilify when she had hives. She stated that she has never tried Depakote or lithium and Tegretol. She denied any history of suicide attempts in the past.  Substance Abuse History in the last 12 months:  No.  Consequences of Substance Abuse: Negative NA  Medical Decision Making:  Review of Psycho-Social Stressors (1) and Established Problem, Worsening (2)  Treatment Plan Summary: Medication management    She  will receive Invega Sustenna 234  mg IM today Will give her a prescription of  Invega Sustenna  234 mg  and she will follow-up in 4 weeks  Advised patient that if she notices any worsening of her symptoms she should contact us immediately and she demonstrated understanding. Continue Depakote 250 mg in the morning and 500 at bedtime. She will continue on Viibryd 40 mg daily  Patient has enough supply of the medications. Continue  trazodone 100 mg by mouth daily at bedtime when necessary for insomnia.    More than 50% of the time spent in psychoeducation, counseling and coordination of care.     This note was generated in part or whole with voice recognition software. Voice regonition is usually quite accurate but there are transcription errors that can and very often do occur. I apologize for any typographical errors that were not detected and corrected.    Rainey Pines, MD    5/7/20183:50 PM

## 2016-09-07 ENCOUNTER — Ambulatory Visit (INDEPENDENT_AMBULATORY_CARE_PROVIDER_SITE_OTHER): Payer: Medicare Other | Admitting: Family Medicine

## 2016-09-07 ENCOUNTER — Encounter: Payer: Self-pay | Admitting: Family Medicine

## 2016-09-07 VITALS — BP 130/65 | HR 94 | Temp 98.5°F | Resp 16 | Ht 62.5 in | Wt 238.0 lb

## 2016-09-07 DIAGNOSIS — R51 Headache: Secondary | ICD-10-CM | POA: Diagnosis not present

## 2016-09-07 DIAGNOSIS — R519 Headache, unspecified: Secondary | ICD-10-CM | POA: Insufficient documentation

## 2016-09-07 DIAGNOSIS — K591 Functional diarrhea: Secondary | ICD-10-CM | POA: Insufficient documentation

## 2016-09-07 DIAGNOSIS — R002 Palpitations: Secondary | ICD-10-CM | POA: Diagnosis not present

## 2016-09-07 DIAGNOSIS — R197 Diarrhea, unspecified: Secondary | ICD-10-CM | POA: Diagnosis not present

## 2016-09-07 NOTE — Assessment & Plan Note (Signed)
Unclear exact etiology, seems most consistent with tension headaches vs rebound headache on Ibuprofen. No complications and not suggestive of migraines vs other complex HA  Plan: 1. Start with inc NSAID dose Ibuprofen 600mg  TID PRN wc to see if better resolves. If not improving by 1 week, can switch to Tylenol, try to avoid rebound from NSAID. Also may try Excedrin Migraine 2. Handout given for headache diary, may learn possible triggers 3. Gradually reduce caffeine intake 4. Follow-up as needed 4-6 weeks

## 2016-09-07 NOTE — Progress Notes (Signed)
Subjective:    Patient ID: Tabitha [name], female    DOB: 1971-02-08, 46 y.o.   MRN: [id number]  Tabitha [name] is a 46 y.o. female presenting on 09/07/2016 for Palpitations (onset 3 months getting worst recently, diarrhea and HA ongoing  3 months)   HPI   INTERMITTENT PALPITATIONS Reports new problem over past 3 months, has never evaluated before, describes sensation of palpitations with heart beating sometimes "faster and or harder", episodes are intermittent lasting up to 20 minutes per episode, usually do not recur on that night or during the day, mostly occurs at night but occasionally will happen during day while walking or active or during day while sedentary and not active. - Associated symptoms with palpitations include transient numbness in both arms and legs, usually this is only at night, may last 20 min maybe longer, but always self limited. Also associated with blurry vision.. Additionally some dyspnea with difficulty catching breath, does admit to having OSA with CPAP machine. - TSH normal in past, last 2017 - Denies chest pain or pressure, near syncope, dizziness, lightheadedness  Headaches, Frequent: - Newer problem in past few weeks, mostly top of head, pounding 6/10 severity, usually self limited within same day - Tried taking Ibuprofen 200mg  x 2 per dose about 2x daily (sometimes this relieves headache, not always), has tried Tylenol in the past with some relief. Not tried Excedrin Migraine - No known prior chronic headaches or migraines before - Drinks caffeine 2-3 cups coffee daily and 8 oz soda 2 times daily - Admits some photosensitivity - Denies nausea, vomiting, numbness, tingling weakness, aura  Diarrhea, Chronic: - Reported chronic problem over past >1 year, almost every BM is loose and diarrhea, cramping, without abdominal pain. Upon medication review, she admits that she started higher dose Metformin about 1 year ago due to elevated blood sugars with  Diabetes Type 2, and was on Metformin XR 1000mg  daily and then switched to 500mg  x 2 tabs 1000mg  twice daily with food - On Doxycycline 50mg  daily for Rosacea, chronic med, not related to diarrhea - Denies any blood in stool or dark stools, abdominal pain, nausea, vomiting  Social History  Substance Use Topics  . Smoking status: Never Smoker  . Smokeless tobacco: Never Used  . Alcohol use No    Review of Systems Per HPI unless specifically indicated above     Objective:    BP 130/65   Pulse 94   Temp 98.5 F (36.9 C) (Oral)   Resp 16   Ht 5' 2.5" (1.588 m)   Wt 238 lb (108 kg)   BMI 42.84 kg/m   Wt Readings from Last 3 Encounters:  09/07/16 238 lb (108 kg)  06/15/16 238 lb (108 kg)  05/08/16 237 lb 9.6 oz (107.8 kg)    Physical Exam  Constitutional: She is oriented to person, place, and time. She appears well-developed and well-nourished. No distress.  Well-appearing, comfortable, cooperative, obese  HENT:  Head: Normocephalic and atraumatic.  Mouth/Throat: Oropharynx is clear and moist.  Eyes: Conjunctivae are normal.  Neck: Normal range of motion. Neck supple. No thyromegaly present.  Cardiovascular: Normal rate, regular rhythm, normal heart sounds and intact distal pulses.   No murmur heard. No ectopy  Pulmonary/Chest: Effort normal and breath sounds normal. No respiratory distress. She has no wheezes. She has no rales.  Abdominal: Soft. Bowel sounds are normal. She exhibits no distension and no mass. There is no tenderness.  Musculoskeletal: She exhibits no edema.  Lymphadenopathy:    She has no cervical adenopathy.  Neurological: She is alert and oriented to person, place, and time.  Skin: Skin is warm and dry. No rash noted. She is not diaphoretic. No erythema.  Psychiatric: She has a normal mood and affect. Her behavior is normal.  Nursing note and vitals reviewed.      Assessment & Plan:   Problem List Items Addressed This Visit    Intermittent  palpitations - Primary    Uncertain exact etiology, given history most suggestive of variety of factors, can be secondary to caffeine use, possibly with anxiety vs psych bipolar dx and psych meds, also deconditioning and obesity. Cannot rule out cardiac etiology and some concern given dyspnea symptoms. - Reviewed prior labs, last normal TSH 2017  Plan: 1. Discussion on differential of these palpitations, provided reassurance, but overall agree to proceed with Cardiology referral [facility] for further evaluation and may need cardiac event monitor 2. Advised to reduce amt of caffeine intake 3. Follow-up as needed      Relevant Orders   Ambulatory referral to Cardiology   Functional diarrhea    Suspected chronic diarrhea >1 year without complication due to Metformin use, now on 1000mg  BID, seems to be related to onset of symptoms - Benign abdomen, no red flag GI symptoms  Plan: 1. HOLD current dose Metformin 1000mg  BID (500mg  tabs x 2 per dose with food). If diarrhea resolves within 1-2 weeks, then will start back on Metformin XR either 750 or 1000mg  daily and eventually increase if needed, patient already has follow-up scheduled for next Monday 5/21, re-evaluate at that time, if persistent diarrhea despite off metformin, can still reduce to XR and likely consider stools studies vs referral to GI      Frequent headaches    Unclear exact etiology, seems most consistent with tension headaches vs rebound headache on Ibuprofen. No complications and not suggestive of migraines vs other complex HA  Plan: 1. Start with inc NSAID dose Ibuprofen 600mg  TID PRN wc to see if better resolves. If not improving by 1 week, can switch to Tylenol, try to avoid rebound from NSAID. Also may try Excedrin Migraine 2. Handout given for headache diary, may learn possible triggers 3. Gradually reduce caffeine intake 4. Follow-up as needed 4-6 weeks         No orders of the defined types were placed in this  encounter.   Follow up plan: Return in about 3 months (around 12/08/2016) for Headaches, Palpitations.  Saralyn PilarAlexander Danis Pembleton, DO [facility] 09/07/2016, 11:20 PM

## 2016-09-07 NOTE — Assessment & Plan Note (Signed)
Suspected chronic diarrhea >1 year without complication due to Metformin use, now on 1000mg  BID, seems to be related to onset of symptoms - Benign abdomen, no red flag GI symptoms  Plan: 1. HOLD current dose Metformin 1000mg  BID (500mg  tabs x 2 per dose with food). If diarrhea resolves within 1-2 weeks, then will start back on Metformin XR either 750 or 1000mg  daily and eventually increase if needed, patient already has follow-up scheduled for next Monday 5/21, re-evaluate at that time, if persistent diarrhea despite off metformin, can still reduce to XR and likely consider stools studies vs referral to GI

## 2016-09-07 NOTE — Patient Instructions (Signed)
Thank you for coming to the clinic today.  1. For Palpitations - Most likely benign, can have some extra heart beats and rapid rate, usually benign if not related to significant stress or exertion - Try to limit caffeine intake this can make it worse - Some people are more sensitive to these changes than others  Referral to [facility] - if you do not hear back in 1-2 weeks can contact their office for status  [facility] [address] [place], [place] [address] Main: [phone]   2. Headaches - Most likely tension headaches, try to increase Ibuprofen to 600mg  per dose up to 3 times a day for next 1-2 weeks ONLY as needed for headaches. - If still persistent headaches, maybe rebound headaches, can STOP ibuprofen or taper down, and switch to Tylenol Ext Str 500mg  tablets, take 1-2 per dose up to 3 times a day - OR can switch to Excedrin Migraine - Try to limit caffeine - May try to avoid eye strain and stress (computer, TV< screen), this can make tension headaches worse, make sure you stretch head and neck and do not stay in fixed position for long period of time  3. Diarrhea - Most likely medication side effect - STOP metformin completely for 1 week, maybe 2 weeks if still only mild symptoms - Then if diarrhea resolves, and you feel like it was caused by the metformin, then notify office by phone or mychart message and we can send in new rx for Metformin Extended Release (XR), gradually increase dose  Please schedule a follow-up appointment with Dr. Althea CharonKaramalegos in 3 months to follow-up Headaches, Palpitations  If you have any other questions or concerns, please feel free to call the clinic or send a message through MyChart. You may also schedule an earlier appointment if necessary.  Saralyn PilarAlexander Eiko Mcgowen, DO [facility]

## 2016-09-07 NOTE — Assessment & Plan Note (Signed)
Uncertain exact etiology, given history most suggestive of variety of factors, can be secondary to caffeine use, possibly with anxiety vs psych bipolar dx and psych meds, also deconditioning and obesity. Cannot rule out cardiac etiology and some concern given dyspnea symptoms. - Reviewed prior labs, last normal TSH 2017  Plan: 1. Discussion on differential of these palpitations, provided reassurance, but overall agree to proceed with Cardiology referral [facility] for further evaluation and may need cardiac event monitor 2. Advised to reduce amt of caffeine intake 3. Follow-up as needed

## 2016-09-14 ENCOUNTER — Ambulatory Visit: Payer: Self-pay | Admitting: Family Medicine

## 2016-09-18 ENCOUNTER — Other Ambulatory Visit: Payer: Self-pay

## 2016-09-18 MED ORDER — OMEPRAZOLE 10 MG PO CPDR: DELAYED_RELEASE_CAPSULE | ORAL | 5 refills | Status: DC

## 2016-09-18 NOTE — Telephone Encounter (Signed)
Last ov 09/07/16  Last filled 08/19/15 Please review. Thank you. sd

## 2016-09-23 ENCOUNTER — Other Ambulatory Visit: Payer: Self-pay

## 2016-09-23 DIAGNOSIS — E119 Type 2 diabetes mellitus without complications: Secondary | ICD-10-CM

## 2016-09-23 DIAGNOSIS — Z794 Long term (current) use of insulin: Secondary | ICD-10-CM

## 2016-09-23 MED ORDER — PIOGLITAZONE HCL 45 MG PO TABS: 45.0000 mg | ORAL_TABLET | Freq: Every day | ORAL | 12 refills | Status: DC

## 2016-09-30 ENCOUNTER — Ambulatory Visit: Payer: Medicare Other | Admitting: Psychiatry

## 2016-09-30 ENCOUNTER — Ambulatory Visit: Payer: Medicare Other

## 2016-10-02 DIAGNOSIS — F259 Schizoaffective disorder, unspecified: Secondary | ICD-10-CM

## 2016-10-02 DIAGNOSIS — K219 Gastro-esophageal reflux disease without esophagitis: Secondary | ICD-10-CM | POA: Insufficient documentation

## 2016-10-02 DIAGNOSIS — F251 Schizoaffective disorder, depressive type: Secondary | ICD-10-CM | POA: Insufficient documentation

## 2016-10-02 DIAGNOSIS — R002 Palpitations: Secondary | ICD-10-CM | POA: Insufficient documentation

## 2016-10-02 DIAGNOSIS — F319 Bipolar disorder, unspecified: Secondary | ICD-10-CM | POA: Insufficient documentation

## 2016-10-07 ENCOUNTER — Other Ambulatory Visit: Payer: Self-pay | Admitting: Family Medicine

## 2016-10-07 ENCOUNTER — Other Ambulatory Visit: Payer: Self-pay

## 2016-10-07 DIAGNOSIS — I10 Essential (primary) hypertension: Secondary | ICD-10-CM

## 2016-10-07 MED ORDER — LISINOPRIL 2.5 MG PO TABS: 2.5000 mg | ORAL_TABLET | Freq: Every day | ORAL | 0 refills | Status: DC

## 2016-10-12 ENCOUNTER — Ambulatory Visit (INDEPENDENT_AMBULATORY_CARE_PROVIDER_SITE_OTHER): Payer: 59 | Admitting: Psychiatry

## 2016-10-12 ENCOUNTER — Other Ambulatory Visit: Payer: Self-pay | Admitting: Psychiatry

## 2016-10-12 ENCOUNTER — Ambulatory Visit: Payer: Medicare Other

## 2016-10-12 DIAGNOSIS — F250 Schizoaffective disorder, bipolar type: Secondary | ICD-10-CM

## 2016-10-12 MED ORDER — PALIPERIDONE ER 6 MG PO TB24: 6.0000 mg | ORAL_TABLET | Freq: Two times a day (BID) | ORAL | 1 refills | Status: DC

## 2016-10-12 NOTE — Progress Notes (Signed)
Psychiatric Follow up MD/NP Note   Patient Identification: Tabitha [name] MRN:  [id number] Date of Evaluation:  10/12/2016 Referral Source: [facility].  Chief Complaint:    Visit Diagnosis:    ICD-10-CM   1. Schizoaffective disorder, bipolar type (HCC) F25.0    Diagnosis:   Patient Active Problem List   Diagnosis Date Noted  . GERD (gastroesophageal reflux disease) [K21.9]   . Palpitations [R00.2]   . Bipolar disorder (HCC) [F31.9]   . Schizoaffective disorder (HCC) [F25.9]   . Intermittent palpitations [R00.2] 09/07/2016  . Frequent headaches [R51] 09/07/2016  . 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 Asthma [J45.909] 06/15/2016  . Acne rosacea [L71.9] 06/15/2016  . Allergic conjunctivitis of both eyes [H10.13] 05/08/2016  . Obesity [E66.9] 09/24/2015  . Diabetes mellitus type 2, controlled, without complications (HCC) [E11.9] 12/03/2014  . HBP (high blood pressure) [I10] 12/03/2014  . Hyperlipidemia [E78.5] 12/03/2014  . Bipolar disorder in partial remission (HCC) [F31.70] 12/03/2014  . GERD without esophagitis [K21.9] 12/03/2014   History of Present Illness:    Patient is a 46 year-old married female who presented for follow-up.Patient reported that she has been feeling more anxious and agitated and has been having auditory and visual hallucinations. She reported that she does not feel that the medication and the [place] is helpful. She reported that she missed her injection as she was out of town with her family.  Patient reported that she does not want to have the injection and wants to go back on the oral medication as it was more helpful. She reported that she was in the church last week and she started hearing voices and thought that the pastor was talking about her. She also felt that her sister-in-law came to the restaurant following her. She feels that she is becoming more paranoid and there are cameras in the house. She stated that she  wants to try the oral  medications. She currently denied having any suicidal ideations or plans.   We discussed about medications and she reported that she will try oral Invega at this time and if she does not improve we can try her on different medication. Patient stated that she has been compliant with her medications and her husband is supportive.   Patient denied having any suicidal homicidal ideations or plans. She denied having any perceptual disturbances.    Past Medical History:  Past Medical History:  Diagnosis Date  . Asthma   . Bipolar disorder (HCC)   . Depression   . Diabetes mellitus without complication (HCC)   . GERD (gastroesophageal reflux disease)   . Hyperlipidemia   . Mild sleep apnea   . Palpitations   . Schizoaffective disorder [facility])     Past Surgical History:  Procedure Laterality Date  . CESAREAN SECTION  2006/2008  . CHOLECYSTECTOMY  2006   Family History:  Family History  Problem Relation Age of Onset  . Diabetes Father   . Hyperlipidemia Father   . Diabetes Brother   . Lupus Mother    Social History:   Social History   Social History  . Marital status: Married    Spouse name: N/A  . Number of children: N/A  . Years of education: N/A   Social History Main Topics  . Smoking status: Never Smoker  . Smokeless tobacco: Never Used  . Alcohol use No  . Drug use: No  . Sexual activity: Yes    Partners: Male    Birth control/ protection: None  Other Topics Concern  . Not on file   Social History Narrative  . No narrative on file   Additional Social History:  Married x 11 years. Has a good relationship with husband and has a 56 and 39 yo. 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 She stated that she does not smoke cigarettes and does not use any drugs or alcohol   Musculoskeletal: Strength & Muscle Tone: within normal limits Gait & Station: normal Patient leans: N/A  Psychiatric Specialty Exam: Medication Refill   Pertinent negatives include no congestion or coughing.  Anxiety       Review of Systems  Constitutional: Positive for malaise/fatigue. Negative for weight loss.  HENT: Negative for congestion and hearing loss.   Eyes: Negative for photophobia.  Respiratory: Negative for cough and sputum production.   Genitourinary: Negative for urgency.  Psychiatric/Behavioral: Positive for depression and hallucinations.  All other systems reviewed and are negative.   There were no vitals taken for this visit.There is no height or weight on file to calculate BMI.  General Appearance: Casual and Fairly Groomed  Eye Contact:  Fair  Speech:  Clear and Coherent  Volume:  Normal  Mood:  Anxious and Depressed  Affect:  Congruent  Thought Process:  Coherent  Orientation:  Full (Time, Place, and Person)  Thought Content:  Obsessions and Paranoid Ideation  Suicidal Thoughts:  No  Homicidal Thoughts:  No  Memory:  Immediate;   Fair  Judgement:  Fair  Insight:  Fair  Psychomotor Activity:  Normal  Concentration:  Fair  Recall:  [employer] of Knowledge:Fair  Language: Fair  Akathisia:  No  Handed:  Right  AIMS (if indicated):  none  Assets:  Communication Skills Desire for Improvement Housing Intimacy Physical Health Social Support  ADL's:  Intact  Cognition: WNL  Sleep:  6-7    Is the patient at risk to self?  No. Has the patient been a risk to self in the past 6 months?  No. Has the patient been a risk to self within the distant past?  No. Is the patient a risk to others?  No. Has the patient been a risk to others in the past 6 months?  No. Has the patient been a risk to others within the distant past?  No.  Allergies:   Allergies  Allergen Reactions  . Abilify [Aripiprazole] Hives   Current Medications: Current Outpatient Prescriptions  Medication Sig Dispense Refill  . ACCU-CHEK AVIVA PLUS test strip TEST BLOOD SUGAR ONCE DAILY 100 each 12  . atorvastatin (LIPITOR) 20 MG  tablet TAKE 1 TABLET BY MOUTH EVERY NIGHT AT BEDTIME 30 tablet 12  . divalproex (DEPAKOTE) 250 MG DR tablet Take 1 tablet (250 mg total) by mouth every morning. 90 tablet 1  . divalproex (DEPAKOTE) 500 MG DR tablet Take 1 tablet (500 mg total) by mouth 1 day or 1 dose. 90 tablet 1  . doxycycline (VIBRAMYCIN) 50 MG capsule Take 1 capsule (50 mg total) by mouth every morning. 30 capsule 12  . fluticasone (FLOVENT HFA) 110 MCG/ACT inhaler Inhale 1 puff into the lungs 2 (two) times daily. (Patient not taking: Reported on 09/07/2016) 1 Inhaler 12  . Insulin Detemir (LEVEMIR) 100 UNIT/ML Pen Inject 40 Units into the skin 2 (two) times daily. 5 pen 11  . Insulin Pen Needle (BD PEN NEEDLE NANO U/F) 32G X 4 MM MISC 1 each by Does not apply route daily. 100 each 5  .  lisinopril (PRINIVIL,ZESTRIL) 2.5 MG tablet Take 1 tablet (2.5 mg total) by mouth daily. 30 tablet 0  . metformin (FORTAMET) 1000 MG (OSM) 24 hr tablet Take 1,000 mg by mouth 2 (two) times daily.  12  . metFORMIN (GLUCOPHAGE) 500 MG tablet TAKE 2 TABLETS BY MOUTH TWICE DAILY BEFORE A MEAL 180 tablet 3  . NOVOTWIST 32G X 5 MM MISC USE WITH LEVEMIR PEN BID  5  . Olopatadine HCl 0.2 % SOLN Use 1 drop in each eye daily for allergic eye symptoms, watery, itchy eyes 2.5 mL 2  . omeprazole (PRILOSEC) 10 MG capsule TAKE 1 CAPSULE BY MOUTH EVERY DAY 30 capsule 5  . paliperidone (INVEGA) 6 MG 24 hr tablet Take 1 tablet (6 mg total) by mouth 2 (two) times daily. 60 tablet 1  . pioglitazone (ACTOS) 45 MG tablet Take 1 tablet (45 mg total) by mouth daily. 30 tablet 12  . PROAIR HFA 108 (90 Base) MCG/ACT inhaler INHALE 2 PUFFS INTO THE LUNGS EVERY 6 HOURS AS NEEDED FOR WHEEZING OR SHORTNESS OF BREATH 8.5 g 6  . Vilazodone HCl (VIIBRYD) 40 MG TABS Take 1 tablet (40 mg total) by mouth every morning. 90 tablet 1   No current facility-administered medications for this visit.     Previous Psychotropic Medications:  Patient reported that she has previously tried  Risperdal and Seroquel and Zyprexa Paxil and Celexa. She reported that she has history of adverse reaction to Abilify when she had hives. She stated that she has never tried Depakote or lithium and Tegretol. She denied any history of suicide attempts in the past.  Substance Abuse History in the last 12 months:  No.  Consequences of Substance Abuse: Negative NA  Medical Decision Making:  Review of Psycho-Social Stressors (1) and Established Problem, Worsening (2)  Treatment Plan Summary: Medication management    D/c  Invega Sustenna 234 mg IM.   Restart her on oral Invega 6 mg daily for 3 days and then titrate the dose to 6 mg twice a day  Continue Depakote 250 mg in the morning and 500 at bedtime. She will continue on Viibryd 40 mg daily  Patient has enough supply of the medications. D/c  trazodone   Patient is taking melatonin to help her sleep at night.  Follow-up in 1 week to evaluate her symptoms.   More than 50% of the time spent in psychoeducation, counseling and coordination of care.     This note was generated in part or whole with voice recognition software. Voice regonition is usually quite accurate but there are transcription errors that can and very often do occur. I apologize for any typographical errors that were not detected and corrected.    Brandy HaleUzma Rashelle Ireland, MD    6/18/20183:29 PM

## 2016-10-19 ENCOUNTER — Encounter: Payer: Self-pay | Admitting: Psychiatry

## 2016-10-19 ENCOUNTER — Ambulatory Visit (INDEPENDENT_AMBULATORY_CARE_PROVIDER_SITE_OTHER): Payer: Medicare Other

## 2016-10-19 ENCOUNTER — Ambulatory Visit: Payer: Medicare Other

## 2016-10-19 ENCOUNTER — Ambulatory Visit (INDEPENDENT_AMBULATORY_CARE_PROVIDER_SITE_OTHER): Payer: 59 | Admitting: Psychiatry

## 2016-10-19 VITALS — BP 125/81 | HR 103 | Temp 97.9°F | Wt 239.2 lb

## 2016-10-19 DIAGNOSIS — F250 Schizoaffective disorder, bipolar type: Secondary | ICD-10-CM

## 2016-10-19 DIAGNOSIS — F251 Schizoaffective disorder, depressive type: Secondary | ICD-10-CM

## 2016-10-19 MED ORDER — PALIPERIDONE PALMITATE 234 MG/1.5ML IM SUSP
234.0000 mg | Freq: Once | INTRAMUSCULAR | Status: AC
  Administered 2016-10-19: 234 mg via INTRAMUSCULAR

## 2016-10-19 MED ORDER — PALIPERIDONE PALMITATE 234 MG/1.5ML IM SUSP: 234.0000 mg | Freq: Once | INTRAMUSCULAR | 0 refills | Status: DC

## 2016-10-19 MED ORDER — VILAZODONE HCL 40 MG PO TABS: 40.0000 mg | ORAL_TABLET | Freq: Every morning | ORAL | 1 refills | Status: DC

## 2016-10-19 MED ORDER — DIVALPROEX SODIUM 500 MG PO DR TAB: 500.0000 mg | DELAYED_RELEASE_TABLET | ORAL | 1 refills | Status: DC

## 2016-10-19 MED ORDER — DIVALPROEX SODIUM 250 MG PO DR TAB: 250.0000 mg | DELAYED_RELEASE_TABLET | Freq: Every morning | ORAL | 1 refills | Status: DC

## 2016-10-19 NOTE — Progress Notes (Signed)
Psychiatric Follow up MD/NP Note   Patient Identification: Tabitha HouseholderKathleen J [name] MRN:  [id number] Date of Evaluation:  10/19/2016 Referral Source: [facility].  Chief Complaint:   Chief Complaint    Follow-up; Medication Refill     Visit Diagnosis:    ICD-10-CM   1. Schizoaffective disorder, bipolar type (HCC) F25.0    Diagnosis:   Patient Active Problem List   Diagnosis Date Noted  . GERD (gastroesophageal reflux disease) [K21.9]   . Palpitations [R00.2]   . Bipolar disorder (HCC) [F31.9]   . Schizoaffective disorder (HCC) [F25.9]   . Intermittent palpitations [R00.2] 09/07/2016  . Frequent headaches [R51] 09/07/2016  . 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 Asthma [J45.909] 06/15/2016  . Acne rosacea [L71.9] 06/15/2016  . Allergic conjunctivitis of both eyes [H10.13] 05/08/2016  . Obesity [E66.9] 09/24/2015  . Diabetes mellitus type 2, controlled, without complications (HCC) [E11.9] 12/03/2014  . HBP (high blood pressure) [I10] 12/03/2014  . Hyperlipidemia [E78.5] 12/03/2014  . Bipolar disorder in partial remission (HCC) [F31.70] 12/03/2014  . GERD without esophagitis [K21.9] 12/03/2014   History of Present Illness:    Patient is a 46 year-old married female who presented for follow-up.Patient reported that she Continues to feel anxious and paranoid. She reported that she has been having auditory and visual hallucinations. She reported that she stopped the [place]vega Sustenna and it was not helpful. She reported that she is going for a family trip on Friday [place] and is concerned about her worsening symptoms. She agreed to take the [place]ga Sustenna again. Patient reported that she has been compliant with her medications. She currently denied having any suicidal homicidal ideations or plans. We discussed about her medications and she reported that she has been having issues with sleeping. Her husband remains supportive.    Patient will receive Gean BirchwoodInvega Sustenna today.    Past Medical History:  Past Medical History:  Diagnosis Date  . Asthma   . Bipolar disorder (HCC)   . Depression   . Diabetes mellitus without complication (HCC)   . GERD (gastroesophageal reflux disease)   . Hyperlipidemia   . Mild sleep apnea   . Palpitations   . Schizoaffective disorder [facility](HCC)     Past Surgical History:  Procedure Laterality Date  . CESAREAN SECTION  2006/2008  . CHOLECYSTECTOMY  2006   Family History:  Family History  Problem Relation Age of Onset  . Diabetes Father   . Hyperlipidemia Father   . Diabetes Brother   . Lupus Mother    Social History:   Social History   Social History  . Marital status: Married    Spouse name: N/A  . Number of children: N/A  . Years of education: N/A   Social History Main Topics  . Smoking status: Never Smoker  . Smokeless tobacco: Never Used  . Alcohol use No  . Drug use: No  . Sexual activity: Yes    Partners: Male    Birth control/ protection: None   Other Topics Concern  . None   Social History Narrative  . None   Additional Social History:  Married x 11 years. Has a good relationship with husband and has a 2510 and 137 yo. 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 She stated that she does not smoke cigarettes and does not use any drugs or alcohol   Musculoskeletal: Strength & Muscle Tone: within normal limits Gait & Station: normal Patient leans: N/A  Psychiatric Specialty Exam: Medication Refill  Pertinent negatives include no congestion or coughing.  Anxiety       Review of Systems  Constitutional: Positive for malaise/fatigue. Negative for weight loss.  HENT: Negative for congestion and hearing loss.   Eyes: Negative for photophobia.  Respiratory: Negative for cough and sputum production.   Genitourinary: Negative for urgency.  Psychiatric/Behavioral: Positive for depression and hallucinations.  All other systems  reviewed and are negative.   Blood pressure 125/81, pulse (!) 103, temperature 97.9 F (36.6 C), temperature source Oral, weight 239 lb 3.2 oz (108.5 kg).Body mass index is 43.05 kg/m.  General Appearance: Casual and Fairly Groomed  Eye Contact:  Fair  Speech:  Clear and Coherent  Volume:  Normal  Mood:  Anxious and Depressed  Affect:  Congruent  Thought Process:  Coherent  Orientation:  Full (Time, Place, and Person)  Thought Content:  Obsessions and Paranoid Ideation  Suicidal Thoughts:  No  Homicidal Thoughts:  No  Memory:  Immediate;   Fair  Judgement:  Fair  Insight:  Fair  Psychomotor Activity:  Normal  Concentration:  Fair  Recall:  [employer] of Knowledge:Fair  Language: Fair  Akathisia:  No  Handed:  Right  AIMS (if indicated):  none  Assets:  Communication Skills Desire for Improvement Housing Intimacy Physical Health Social Support  ADL's:  Intact  Cognition: WNL  Sleep:  6-7    Is the patient at risk to self?  No. Has the patient been a risk to self in the past 6 months?  No. Has the patient been a risk to self within the distant past?  No. Is the patient a risk to others?  No. Has the patient been a risk to others in the past 6 months?  No. Has the patient been a risk to others within the distant past?  No.  Allergies:   Allergies  Allergen Reactions  . Abilify [Aripiprazole] Hives   Current Medications: Current Outpatient Prescriptions  Medication Sig Dispense Refill  . ACCU-CHEK AVIVA PLUS test strip TEST BLOOD SUGAR ONCE DAILY 100 each 12  . atorvastatin (LIPITOR) 20 MG tablet TAKE 1 TABLET BY MOUTH EVERY NIGHT AT BEDTIME 30 tablet 12  . divalproex (DEPAKOTE) 500 MG DR tablet Take 1 tablet (500 mg total) by mouth 1 day or 1 dose. 90 tablet 1  . doxycycline (VIBRAMYCIN) 50 MG capsule Take 1 capsule (50 mg total) by mouth every morning. 30 capsule 12  . fluticasone (FLOVENT HFA) 110 MCG/ACT inhaler Inhale 1 puff into the lungs 2 (two) times daily.  1 Inhaler 12  . Insulin Detemir (LEVEMIR) 100 UNIT/ML Pen Inject 40 Units into the skin 2 (two) times daily. 5 pen 11  . Insulin Pen Needle (BD PEN NEEDLE NANO U/F) 32G X 4 MM MISC 1 each by Does not apply route daily. 100 each 5  . lisinopril (PRINIVIL,ZESTRIL) 2.5 MG tablet Take 1 tablet (2.5 mg total) by mouth daily. 30 tablet 0  . metformin (FORTAMET) 1000 MG (OSM) 24 hr tablet Take 1,000 mg by mouth 2 (two) times daily.  12  . metFORMIN (GLUCOPHAGE) 500 MG tablet TAKE 2 TABLETS BY MOUTH TWICE DAILY BEFORE A MEAL 180 tablet 3  . NOVOTWIST 32G X 5 MM MISC USE WITH LEVEMIR PEN BID  5  . Olopatadine HCl 0.2 % SOLN Use 1 drop in each eye daily for allergic eye symptoms, watery, itchy eyes 2.5 mL 2  . omeprazole (PRILOSEC) 10 MG capsule TAKE  1 CAPSULE BY MOUTH EVERY DAY 30 capsule 5  . pioglitazone (ACTOS) 45 MG tablet Take 1 tablet (45 mg total) by mouth daily. 30 tablet 12  . PROAIR HFA 108 (90 Base) MCG/ACT inhaler INHALE 2 PUFFS INTO THE LUNGS EVERY 6 HOURS AS NEEDED FOR WHEEZING OR SHORTNESS OF BREATH 8.5 g 6  . Vilazodone HCl (VIIBRYD) 40 MG TABS Take 1 tablet (40 mg total) by mouth every morning. 90 tablet 1  . divalproex (DEPAKOTE) 250 MG DR tablet Take 1 tablet (250 mg total) by mouth every morning. 90 tablet 1  . paliperidone (INVEGA SUSTENNA) 234 MG/1.5ML SUSP injection Inject 234 mg into the muscle once. 1.5 mL 0  . paliperidone (INVEGA) 6 MG 24 hr tablet Take 1 tablet (6 mg total) by mouth 2 (two) times daily. 60 tablet 1   No current facility-administered medications for this visit.     Previous Psychotropic Medications:  Patient reported that she has previously tried Risperdal and Seroquel and Zyprexa Paxil and Celexa. She reported that she has history of adverse reaction to Abilify when she had hives. She stated that she has never tried Depakote or lithium and Tegretol. She denied any history of suicide attempts in the past.  Substance Abuse History in the last 12 months:   No.  Consequences of Substance Abuse: Negative NA  Medical Decision Making:  Review of Psycho-Social Stressors (1) and Established Problem, Worsening (2)  Treatment Plan Summary: Medication management    Continue   Invega Sustenna 234 mg IM.   Continue  oral Invega  6 mg twice a day  Continue Depakote 250 mg in the morning and 500 at bedtime. She will continue on Viibryd 40 mg daily    Patient is taking melatonin to help her sleep at night.  Follow-up in 1 month  to evaluate her symptoms.   More than 50% of the time spent in psychoeducation, counseling and coordination of care.     This note was generated in part or whole with voice recognition software. Voice regonition is usually quite accurate but there are transcription errors that can and very often do occur. I apologize for any typographical errors that were not detected and corrected.    Brandy Hale, MD    6/25/20182:20 PM

## 2016-10-19 NOTE — Progress Notes (Signed)
invega sustenna 234mg  NDC [id number]  lot # [id number] exp [id number]  [id number] given left quadrant gluteal

## 2016-10-23 ENCOUNTER — Encounter: Payer: Self-pay | Admitting: Family Medicine

## 2016-10-23 ENCOUNTER — Ambulatory Visit (INDEPENDENT_AMBULATORY_CARE_PROVIDER_SITE_OTHER): Payer: Medicare Other | Admitting: Family Medicine

## 2016-10-23 VITALS — BP 117/73 | HR 88 | Temp 97.8°F | Ht 63.0 in | Wt 242.0 lb

## 2016-10-23 DIAGNOSIS — E119 Type 2 diabetes mellitus without complications: Secondary | ICD-10-CM | POA: Diagnosis not present

## 2016-10-23 DIAGNOSIS — E784 Other hyperlipidemia: Secondary | ICD-10-CM

## 2016-10-23 DIAGNOSIS — M7989 Other specified soft tissue disorders: Secondary | ICD-10-CM

## 2016-10-23 DIAGNOSIS — Z7689 Persons encountering health services in other specified circumstances: Secondary | ICD-10-CM | POA: Diagnosis not present

## 2016-10-23 DIAGNOSIS — E7849 Other hyperlipidemia: Secondary | ICD-10-CM

## 2016-10-23 DIAGNOSIS — I10 Essential (primary) hypertension: Secondary | ICD-10-CM

## 2016-10-23 DIAGNOSIS — Z794 Long term (current) use of insulin: Secondary | ICD-10-CM

## 2016-10-23 DIAGNOSIS — F319 Bipolar disorder, unspecified: Secondary | ICD-10-CM

## 2016-10-23 LAB — LIPID PANEL PICCOLO, WAIVED
CHOL/HDL RATIO PICCOLO,WAIVE: 3 mg/dL
Cholesterol Piccolo, Waived: 144 mg/dL (ref <200)
HDL Chol Piccolo, Waived: 48 mg/dL — ABNORMAL LOW (ref >59)
LDL CHOL CALC PICCOLO WAIVED: 26 mg/dL (ref <100)
Triglycerides Piccolo,Waived: 352 mg/dL — ABNORMAL HIGH (ref <150)
VLDL Chol Calc Piccolo,Waive: 70 mg/dL — ABNORMAL HIGH (ref <30)

## 2016-10-23 LAB — UA/M W/RFLX CULTURE, ROUTINE
BILIRUBIN UA: NEGATIVE
Leukocytes, UA: NEGATIVE
Nitrite, UA: NEGATIVE
PROTEIN UA: NEGATIVE
RBC UA: NEGATIVE
SPEC GRAV UA: 1.01 (ref 1.005–1.030)
Urobilinogen, Ur: 0.2 mg/dL (ref 0.2–1.0)
pH, UA: 6 (ref 5.0–7.5)

## 2016-10-23 LAB — BAYER DCA HB A1C WAIVED: HB A1C: 7.4 % — AB (ref <7.0)

## 2016-10-23 MED ORDER — HYDROCHLOROTHIAZIDE 12.5 MG PO TABS: 12.5000 mg | ORAL_TABLET | Freq: Every day | ORAL | 0 refills | Status: DC

## 2016-10-23 MED ORDER — CANAGLIFLOZIN 100 MG PO TABS: 100.0000 mg | ORAL_TABLET | Freq: Every day | ORAL | 3 refills | Status: DC

## 2016-10-23 MED ORDER — GLUCOSE BLOOD VI STRP: 1.0000 | ORAL_STRIP | 12 refills | Status: DC | PRN

## 2016-10-23 NOTE — Assessment & Plan Note (Signed)
Used shared decision making, will switch lisinopril to HCTZ in hopes of helping reduce the edema in b/l LEs. Discussed close home monitoring over the next few weeks and to call with persistent abnormal readings.

## 2016-10-23 NOTE — Assessment & Plan Note (Signed)
At goal, LDL of 26 today. Await CMP. Lifestyle modifications reviewed for risk factor modification. Continue current regimen.

## 2016-10-23 NOTE — Assessment & Plan Note (Signed)
Not at goal, A1C today 7.4. Will add invokana to current regimen. Long discussion about diet and exercise changes, weight loss to further help maintain goal BS's. Continue checking home readings. Watch carefully for hypoglycemic sxs. Will recheck in 3 months.

## 2016-10-23 NOTE — Progress Notes (Signed)
BP 117/73   Pulse 88   Temp 97.8 F (36.6 C)   Ht 5\' 3"  (1.6 m)   Wt 242 lb (109.8 kg)   SpO2 97%   BMI 42.87 kg/m    Subjective:    Patient ID: Tabitha [name], female    DOB: 1970-07-29, 46 y.o.   MRN: [id number]  HPI: Tabitha [name] is a 46 y.o. female  Chief Complaint  Patient presents with  . Annual Exam  . Medication Refill    She needs a refill on Lisinopril and Accu-Check Aviva Plus test strips  . Foot Swelling    been going on for a while, both feet  . Diabetes    last DM follow up was in Feb.   Patient presents today to establish care after her previous PCP retired. Followed regularly for multiple chronic conditions. Last f/u was 05/2016.   DM - checking sugars daily, fasting sugars running 150s-190s average. Taking actos, metformin, and levemir 40 units BID. No hypoglycemic episodes. States her diet has lots of room for improvement. [facility]. Last A1C 4 months ago was 6.6.   BPs doing well with 2.5 mg lisinopril. Denies CP, SOB, HAs. Checks BPs occasionally at home and gets 110-120/70s. Does note some leg swelling b/l that has been ongoing for several years. Worse after being on her feet long periods. Dull, achy heaviness to legs. Has not tried anything for relief in the past.   Doing well on atorvastatin, no side effects noted. Does not watch diet very closely but knows there is improvements to be made. Does not current exercise regularly. Denies CP, SOB, claudication.   Followed by psychiatry for bipolar disorder and schizoaffective.   Past Medical History:  Diagnosis Date  . Allergic rhinitis   . Anxiety   . Asthma   . Bipolar disorder (HCC)   . Depression   . Diabetes mellitus without complication (HCC)   . GERD (gastroesophageal reflux disease)   . Hyperlipidemia   . Mild sleep apnea   . OCD (obsessive compulsive disorder)   . Palpitations   . Paranoia (HCC)   . Schizoaffective disorder (HCC)   . Sleep apnea     Social History   Social History  . Marital status: Married    Spouse name: N/A  . Number of children: N/A  . Years of education: N/A   Occupational History  . Not on file.   Social History Main Topics  . Smoking status: Never Smoker  . Smokeless tobacco: Never Used  . Alcohol use No  . Drug use: No  . Sexual activity: Yes    Partners: Male    Birth control/ protection: None   Other Topics Concern  . Not on file   Social History Narrative  . No narrative on file    Relevant past medical, surgical, family and social history reviewed and updated as indicated. Interim medical history since our last visit reviewed. Allergies and medications reviewed and updated.  Review of Systems  Constitutional: Negative.   HENT: Negative.   Respiratory: Negative.   Cardiovascular: Positive for leg swelling.  Gastrointestinal: Negative.   Genitourinary: Negative.   Musculoskeletal: Negative.   Skin: Negative.   Neurological: Negative.   Psychiatric/Behavioral: Negative.    Per HPI unless specifically indicated above     Objective:    BP 117/73   Pulse 88   Temp 97.8 F (36.6 C)   Ht 5\' 3"  (1.6 m)  Wt 242 lb (109.8 kg)   SpO2 97%   BMI 42.87 kg/m   Wt Readings from Last 3 Encounters:  10/23/16 242 lb (109.8 kg)  09/07/16 238 lb (108 kg)  06/15/16 238 lb (108 kg)    Physical Exam  Constitutional: She is oriented to person, place, and time. She appears well-developed and well-nourished.  HENT:  Head: Atraumatic.  Eyes: Conjunctivae are normal. Pupils are equal, round, and reactive to light.  Neck: Normal range of motion. Neck supple.  Cardiovascular: Normal rate, normal heart sounds and intact distal pulses.   Pulmonary/Chest: Effort normal and breath sounds normal. No respiratory distress.  Musculoskeletal: Normal range of motion. She exhibits edema (Symmetric, non-pitting edema of b/l ankles and feet). She exhibits no tenderness.  Neurological: She is alert and  oriented to person, place, and time.  Skin: Skin is warm and dry.  Psychiatric: She has a normal mood and affect. Her behavior is normal.  Nursing note and vitals reviewed.  Results for orders placed or performed in visit on 10/23/16  Bayer DCA Hb A1c Waived  Result Value Ref Range   Bayer DCA Hb A1c Waived 7.4 (H) <7.0 %  Lipid Panel Piccolo, Waived  Result Value Ref Range   Cholesterol Piccolo, Waived 144 <200 mg/dL   HDL Chol Piccolo, Waived 48 (L) >59 mg/dL   Triglycerides Piccolo,Waived 352 (H) <150 mg/dL   Chol/HDL Ratio Piccolo,Waive 3.0 mg/dL   LDL Chol Calc Piccolo Waived 26 <100 mg/dL   VLDL Chol Calc Piccolo,Waive 70 (H) <30 mg/dL  UA/M w/rflx Culture, Routine  Result Value Ref Range   Specific Gravity, UA 1.010 1.005 - 1.030   pH, UA 6.0 5.0 - 7.5   Color, UA Yellow Yellow   Appearance Ur Clear Clear   Leukocytes, UA Negative Negative   Protein, UA Negative Negative/Trace   Glucose, UA 3+ (A) Negative   Ketones, UA 2+ (A) Negative   RBC, UA Negative Negative   Bilirubin, UA Negative Negative   Urobilinogen, Ur 0.2 0.2 - 1.0 mg/dL   Nitrite, UA Negative Negative      Assessment & Plan:   Problem List Items Addressed This Visit      Cardiovascular and Mediastinum   HBP (high blood pressure)    Used shared decision making, will switch lisinopril to HCTZ in hopes of helping reduce the edema in b/l LEs. Discussed close home monitoring over the next few weeks and to call with persistent abnormal readings.       Relevant Medications   hydrochlorothiazide (HYDRODIURIL) 12.5 MG tablet   Other Relevant Orders   Comprehensive metabolic panel     Endocrine   Diabetes mellitus type 2, controlled, without complications (HCC)    Not at goal, A1C today 7.4. Will add invokana to current regimen. Long discussion about diet and exercise changes, weight loss to further help maintain goal BS's. Continue checking home readings. Watch carefully for hypoglycemic sxs. Will recheck  in 3 months.       Relevant Medications   canagliflozin (INVOKANA) 100 MG TABS tablet   Other Relevant Orders   Bayer DCA Hb A1c Waived (Completed)   UA/M w/rflx Culture, Routine (Completed)     Other   Hyperlipidemia    At goal, LDL of 26 today. Await CMP. Lifestyle modifications reviewed for risk factor modification. Continue current regimen.       Relevant Medications   hydrochlorothiazide (HYDRODIURIL) 12.5 MG tablet   Other Relevant Orders   Lipid Panel Piccolo,  Waived (Completed)   Comprehensive metabolic panel   Bipolar disorder (HCC)    Followed by Psychiatry       Other Visit Diagnoses    Encounter to establish care    -  Primary   Leg swelling       Hoping changing to HCTZ will help some, but discussed low salt diet, leg elevation when resting, and compression stockings.        Follow up plan: Return in about 3 months (around 01/23/2017) for DM, BP recheck, CMP.

## 2016-10-23 NOTE — Assessment & Plan Note (Signed)
Followed by Psychiatry

## 2016-10-24 LAB — COMPREHENSIVE METABOLIC PANEL
A/G RATIO: 1.7 (ref 1.2–2.2)
ALBUMIN: 4.1 g/dL (ref 3.5–5.5)
ALT: 19 IU/L (ref 0–32)
AST: 16 IU/L (ref 0–40)
Alkaline Phosphatase: 78 IU/L (ref 39–117)
BILIRUBIN TOTAL: 0.3 mg/dL (ref 0.0–1.2)
BUN / CREAT RATIO: 13 (ref 9–23)
BUN: 10 mg/dL (ref 6–24)
CHLORIDE: 93 mmol/L — AB (ref 96–106)
CO2: 19 mmol/L — ABNORMAL LOW (ref 20–29)
Calcium: 9.6 mg/dL (ref 8.7–10.2)
Creatinine, Ser: 0.76 mg/dL (ref 0.57–1.00)
GFR calc non Af Amer: 96 mL/min/{1.73_m2} (ref >59)
GFR, EST AFRICAN AMERICAN: 110 mL/min/{1.73_m2} (ref >59)
GLOBULIN, TOTAL: 2.4 g/dL (ref 1.5–4.5)
Glucose: 272 mg/dL — ABNORMAL HIGH (ref 65–99)
POTASSIUM: 4.5 mmol/L (ref 3.5–5.2)
SODIUM: 135 mmol/L (ref 134–144)
TOTAL PROTEIN: 6.5 g/dL (ref 6.0–8.5)

## 2016-10-26 ENCOUNTER — Encounter: Payer: Self-pay | Admitting: Family Medicine

## 2016-11-09 ENCOUNTER — Other Ambulatory Visit: Payer: Self-pay | Admitting: Psychiatry

## 2016-11-16 ENCOUNTER — Ambulatory Visit: Payer: 59 | Admitting: Psychiatry

## 2016-11-16 ENCOUNTER — Telehealth: Payer: Self-pay | Admitting: Family Medicine

## 2016-11-16 ENCOUNTER — Ambulatory Visit (INDEPENDENT_AMBULATORY_CARE_PROVIDER_SITE_OTHER): Payer: 59

## 2016-11-16 ENCOUNTER — Encounter: Payer: Self-pay | Admitting: Psychiatry

## 2016-11-16 ENCOUNTER — Ambulatory Visit (INDEPENDENT_AMBULATORY_CARE_PROVIDER_SITE_OTHER): Payer: Medicare Other | Admitting: Psychiatry

## 2016-11-16 VITALS — BP 118/72 | HR 88 | Ht 62.5 in | Wt 240.0 lb

## 2016-11-16 DIAGNOSIS — F250 Schizoaffective disorder, bipolar type: Secondary | ICD-10-CM

## 2016-11-16 DIAGNOSIS — F429 Obsessive-compulsive disorder, unspecified: Secondary | ICD-10-CM | POA: Diagnosis not present

## 2016-11-16 MED ORDER — PALIPERIDONE PALMITATE 234 MG/1.5ML IM SUSP: 234.0000 mg | Freq: Once | INTRAMUSCULAR | 0 refills | Status: DC

## 2016-11-16 MED ORDER — PALIPERIDONE PALMITATE 234 MG/1.5ML IM SUSP
234.0000 mg | Freq: Once | INTRAMUSCULAR | Status: AC
  Administered 2016-11-16: 234 mg via INTRAMUSCULAR

## 2016-11-16 MED ORDER — PALIPERIDONE ER 6 MG PO TB24: 6.0000 mg | ORAL_TABLET | Freq: Every day | ORAL | 1 refills | Status: DC

## 2016-11-16 NOTE — Telephone Encounter (Signed)
Routing to provider. Is this a possible side effect of the Invokona?

## 2016-11-16 NOTE — Telephone Encounter (Signed)
Patient called to see if she could speak with Amy regarding the Invokona she is taking, she states she stays tired all the time and her psychiatrist feels that this may be causing her tiredness.   [phone] Nicholos JohnsKathleen

## 2016-11-16 NOTE — Progress Notes (Signed)
Met with patient after her evaluation with Dr. Gretel Acre today to prepare and administer her due [name] 265m/1.5 ML Injection.  Medication prepared as ordered and given to patient in her right upper outer gluteal quadrant.  Patient tolerated injection without complaint of pain or discomfort and reports medication has been helping and no complaints.  Patient to call if any problems prior to next evaluation denied any current symptoms, no suicidal or homicidal ideations at this time. Return in one month.

## 2016-11-16 NOTE — Telephone Encounter (Signed)
Patient notified. She will try that and give us a call back and let us know how it went.

## 2016-11-16 NOTE — Progress Notes (Addendum)
Psychiatric Follow up MD/NP Note   Patient Identification: Tabitha [name] MRN:  [id number] Date of Evaluation:  11/16/2016 Referral Source: [facility].  Chief Complaint:   Chief Complaint    Follow-up     Visit Diagnosis:    ICD-10-CM   1. Schizoaffective disorder, bipolar type (HCC) F25.0   2. Obsessive-compulsive disorder, unspecified type F42.9    Diagnosis:   Patient Active Problem List   Diagnosis Date Noted  . GERD (gastroesophageal reflux disease) [K21.9]   . Palpitations [R00.2]   . Bipolar disorder (HCC) [F31.9]   . Schizoaffective disorder (HCC) [F25.9]   . Intermittent palpitations [R00.2] 09/07/2016  . Frequent headaches [R51] 09/07/2016  . 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 Asthma [J45.909] 06/15/2016  . Acne rosacea [L71.9] 06/15/2016  . Allergic conjunctivitis of both eyes [H10.13] 05/08/2016  . Obesity [E66.9] 09/24/2015  . Diabetes mellitus type 2, controlled, without complications (HCC) [E11.9] 12/03/2014  . HBP (high blood pressure) [I10] 12/03/2014  . Hyperlipidemia [E78.5] 12/03/2014  . Bipolar disorder in partial remission (HCC) [F31.70] 12/03/2014  . GERD without esophagitis [K21.9] 12/03/2014   History of Present Illness:    Patient is a 46 year-old married female who presented for follow-up.Patient reported that she has started noticing in her symptoms. She just returned from a trip in [place] with her family. She reported that she has been sleeping more and has been feeling more tired. She was just started on Invokana for diabetes by her primary care physician. She reported that she sleeps long hours. We discussed about the side effects of Invokana with the patient. She wants to decrease the dose of Invega at this time. Patient appeared happier at this interview. She reported that the paranoia is improving. She has been compliant with her Gean Birchwood injections and comes on a monthly basis. She reported that she is  enjoying her time with her family members.  She is also trying to lose weight and is cooking healthy for her family members.  Patient will receive Gean Birchwood today.    Past Medical History:  Past Medical History:  Diagnosis Date  . Allergic rhinitis   . Anxiety   . Asthma   . Bipolar disorder (HCC)   . Depression   . Diabetes mellitus without complication (HCC)   . GERD (gastroesophageal reflux disease)   . Hyperlipidemia   . Mild sleep apnea   . OCD (obsessive compulsive disorder)   . Palpitations   . Paranoia (HCC)   . Schizoaffective disorder (HCC)   . Sleep apnea     Past Surgical History:  Procedure Laterality Date  . CESAREAN SECTION  2006/2008  . CHOLECYSTECTOMY  2006   Family History:  Family History  Problem Relation Age of Onset  . Diabetes Father   . Hyperlipidemia Father   . Diabetes Brother   . Lupus Mother   . Diabetes Mother        pre diabetes   Social History:   Social History   Social History  . Marital status: Married    Spouse name: N/A  . Number of children: N/A  . Years of education: N/A   Social History Main Topics  . Smoking status: Never Smoker  . Smokeless tobacco: Never Used  . Alcohol use No  . Drug use: No  . Sexual activity: Yes    Partners: Male    Birth control/ protection: None, Surgical   Other Topics Concern  . None   Social History Narrative  .  None   Additional Social History:  Married x 11 years. Has a good relationship with husband and has a 31 and 54 yo. 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 She stated that she does not smoke cigarettes and does not use any drugs or alcohol   Musculoskeletal: Strength & Muscle Tone: within normal limits Gait & Station: normal Patient leans: N/A  Psychiatric Specialty Exam: Medication Refill  Pertinent negatives include no congestion or coughing.  Anxiety       Review of Systems  Constitutional: Positive for malaise/fatigue. Negative for  weight loss.  HENT: Negative for congestion and hearing loss.   Eyes: Negative for photophobia.  Respiratory: Negative for cough and sputum production.   Genitourinary: Negative for urgency.  Psychiatric/Behavioral: Positive for depression and hallucinations.  All other systems reviewed and are negative.   Height 5' 2.5" (1.588 m), weight 240 lb (108.9 kg).Body mass index is 43.2 kg/m.  General Appearance: Casual and Fairly Groomed  Eye Contact:  Fair  Speech:  Clear and Coherent  Volume:  Normal  Mood:  Anxious and Depressed  Affect:  Congruent  Thought Process:  Coherent  Orientation:  Full (Time, Place, and Person)  Thought Content:  Obsessions and Paranoid Ideation  Suicidal Thoughts:  No  Homicidal Thoughts:  No  Memory:  Immediate;   Fair  Judgement:  Fair  Insight:  Fair  Psychomotor Activity:  Normal  Concentration:  Fair  Recall:  [employer] of Knowledge:Fair  Language: Fair  Akathisia:  No  Handed:  Right  AIMS (if indicated):  none  Assets:  Communication Skills Desire for Improvement Housing Intimacy Physical Health Social Support  ADL's:  Intact  Cognition: WNL  Sleep:  6-7    Is the patient at risk to self?  No. Has the patient been a risk to self in the past 6 months?  No. Has the patient been a risk to self within the distant past?  No. Is the patient a risk to others?  No. Has the patient been a risk to others in the past 6 months?  No. Has the patient been a risk to others within the distant past?  No.  Allergies:   Allergies  Allergen Reactions  . Abilify [Aripiprazole] Hives   Current Medications: Current Outpatient Prescriptions  Medication Sig Dispense Refill  . atorvastatin (LIPITOR) 20 MG tablet TAKE 1 TABLET BY MOUTH EVERY NIGHT AT BEDTIME 30 tablet 12  . canagliflozin (INVOKANA) 100 MG TABS tablet Take 1 tablet (100 mg total) by mouth daily before breakfast. 30 tablet 3  . divalproex (DEPAKOTE) 250 MG DR tablet Take 1 tablet (250 mg  total) by mouth every morning. 90 tablet 1  . divalproex (DEPAKOTE) 500 MG DR tablet Take 1 tablet (500 mg total) by mouth 1 day or 1 dose. 90 tablet 1  . doxycycline (VIBRAMYCIN) 50 MG capsule Take 1 capsule (50 mg total) by mouth every morning. 30 capsule 12  . fluticasone (FLOVENT HFA) 110 MCG/ACT inhaler Inhale 1 puff into the lungs 2 (two) times daily. 1 Inhaler 12  . glucose blood (ACCU-CHEK AVIVA PLUS) test strip 1 each by Other route as needed for other. Use as instructed 100 each 12  . hydrochlorothiazide (HYDRODIURIL) 12.5 MG tablet Take 1 tablet (12.5 mg total) by mouth daily. 90 tablet 0  . Insulin Detemir (LEVEMIR) 100 UNIT/ML Pen Inject 40 Units into the skin 2 (two) times daily. 5 pen 11  . Insulin Pen  Needle (BD PEN NEEDLE NANO U/F) 32G X 4 MM MISC 1 each by Does not apply route daily. 100 each 5  . metFORMIN (GLUCOPHAGE) 500 MG tablet TAKE 2 TABLETS BY MOUTH TWICE DAILY BEFORE A MEAL 180 tablet 3  . NOVOTWIST 32G X 5 MM MISC USE WITH LEVEMIR PEN BID  5  . Olopatadine HCl 0.2 % SOLN Use 1 drop in each eye daily for allergic eye symptoms, watery, itchy eyes 2.5 mL 2  . omeprazole (PRILOSEC) 10 MG capsule TAKE 1 CAPSULE BY MOUTH EVERY DAY 30 capsule 5  . paliperidone (INVEGA) 6 MG 24 hr tablet Take 1 tablet (6 mg total) by mouth 2 (two) times daily. 60 tablet 1  . pioglitazone (ACTOS) 45 MG tablet Take 1 tablet (45 mg total) by mouth daily. 30 tablet 12  . PROAIR HFA 108 (90 Base) MCG/ACT inhaler INHALE 2 PUFFS INTO THE LUNGS EVERY 6 HOURS AS NEEDED FOR WHEEZING OR SHORTNESS OF BREATH 8.5 g 6  . Vilazodone HCl (VIIBRYD) 40 MG TABS Take 1 tablet (40 mg total) by mouth every morning. 90 tablet 1  . paliperidone (INVEGA SUSTENNA) 234 MG/1.5ML SUSP injection Inject 234 mg into the muscle once. 1.5 mL 0   No current facility-administered medications for this visit.     Previous Psychotropic Medications:  Patient reported that she has previously tried Risperdal and Seroquel and Zyprexa  Paxil and Celexa. She reported that she has history of adverse reaction to Abilify when she had hives. She stated that she has never tried Depakote or lithium and Tegretol. She denied any history of suicide attempts in the past.  Substance Abuse History in the last 12 months:  No.  Consequences of Substance Abuse: Negative NA  Medical Decision Making:  Review of Psycho-Social Stressors (1) and Established Problem, Worsening (2)  Treatment Plan Summary: Medication management    Continue   Invega Sustenna 234 mg IM.   Continue  oral Invega  6 mg daily  Continue Depakote 250 mg in the morning and 500 at bedtime. She will continue on Viibryd 40 mg daily    Patient is taking melatonin to help her sleep at night.  Follow-up in 1 month  to evaluate her symptoms.   More than 50% of the time spent in psychoeducation, counseling and coordination of care.     This note was generated in part or whole with voice recognition software. Voice regonition is usually quite accurate but there are transcription errors that can and very often do occur. I apologize for any typographical errors that were not detected and corrected.    Brandy HaleUzma Dutch Ing, MD    7/23/201811:19 AM

## 2016-11-16 NOTE — Telephone Encounter (Signed)
Could be, but hard to say. She can try taking a few weeks off to see if things improve while off.

## 2016-12-14 ENCOUNTER — Encounter: Payer: Self-pay | Admitting: Psychiatry

## 2016-12-14 ENCOUNTER — Ambulatory Visit: Payer: Medicare Other

## 2016-12-14 ENCOUNTER — Ambulatory Visit (INDEPENDENT_AMBULATORY_CARE_PROVIDER_SITE_OTHER): Payer: 59

## 2016-12-14 ENCOUNTER — Ambulatory Visit (INDEPENDENT_AMBULATORY_CARE_PROVIDER_SITE_OTHER): Payer: 59 | Admitting: Psychiatry

## 2016-12-14 VITALS — BP 124/70 | HR 92 | Ht 62.25 in | Wt 245.0 lb

## 2016-12-14 DIAGNOSIS — F250 Schizoaffective disorder, bipolar type: Secondary | ICD-10-CM

## 2016-12-14 DIAGNOSIS — F429 Obsessive-compulsive disorder, unspecified: Secondary | ICD-10-CM

## 2016-12-14 MED ORDER — VILAZODONE HCL 40 MG PO TABS: 40.0000 mg | ORAL_TABLET | Freq: Every morning | ORAL | 1 refills | Status: DC

## 2016-12-14 MED ORDER — PALIPERIDONE PALMITATE 234 MG/1.5ML IM SUSP
234.0000 mg | Freq: Once | INTRAMUSCULAR | Status: AC
  Administered 2016-12-14: 234 mg via INTRAMUSCULAR

## 2016-12-14 MED ORDER — PALIPERIDONE ER 6 MG PO TB24: 6.0000 mg | ORAL_TABLET | Freq: Every day | ORAL | 1 refills | Status: DC

## 2016-12-14 MED ORDER — PALIPERIDONE PALMITATE 234 MG/1.5ML IM SUSP: 234.0000 mg | Freq: Once | INTRAMUSCULAR | 0 refills | Status: DC

## 2016-12-14 NOTE — Progress Notes (Signed)
Met with patient after she attended her medication evaluation with Dr. Gretel Acre this date to assist with ordered and due Invega Sustenna 234 mg/1.5 ml IM injection due this date.  Patient presented with brighter affect and level mood today and denied any problems or side effects with new injection medication.  Patient denied any current auditory or visual hallucinations and no suicidal or homicidal ideations.  Patient's ordered Invega Sustenna 234 mg/1.5 ml injection prepared as ordered and given to patient in her left upper outer gluteal quadrant today as she tolerated without any complaint of pain or discomfort.  Patient to return in one month for next due injection and to call if any problems prior to next appointment.

## 2016-12-14 NOTE — Progress Notes (Signed)
Psychiatric Follow up MD/NP Note   Patient Identification: Tabitha [name] MRN:  [id number] Date of Evaluation:  12/14/2016 Referral Source: [facility].  Chief Complaint:   Chief Complaint    Follow-up     Visit Diagnosis:    ICD-10-CM   1. Schizoaffective disorder, bipolar type (HCC) F25.0   2. Obsessive-compulsive disorder, unspecified type F42.9    Diagnosis:   Patient Active Problem List   Diagnosis Date Noted  . GERD (gastroesophageal reflux disease) [K21.9]   . Palpitations [R00.2]   . Bipolar disorder (HCC) [F31.9]   . Schizoaffective disorder (HCC) [F25.9]   . Intermittent palpitations [R00.2] 09/07/2016  . Frequent headaches [R51] 09/07/2016  . 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 Asthma [J45.909] 06/15/2016  . Acne rosacea [L71.9] 06/15/2016  . Allergic conjunctivitis of both eyes [H10.13] 05/08/2016  . Obesity [E66.9] 09/24/2015  . Diabetes mellitus type 2, controlled, without complications (HCC) [E11.9] 12/03/2014  . HBP (high blood pressure) [I10] 12/03/2014  . Hyperlipidemia [E78.5] 12/03/2014  . Bipolar disorder in partial remission (HCC) [F31.70] 12/03/2014  . GERD without esophagitis [K21.9] 12/03/2014   History of Present Illness:    Patient is a 46 year-old married female who presented for follow-up.Patient reported that she has started noticing in her symptoms. She appeared calm and alert during the interview. She reported that the medications are helping her and she is able to disregard the [place]. She reported that she is able to distinguish between the paranoia and the real symptoms. She does not feel that there are cameras in the house. Patient stated that she has good relationship with her family. She is having mild side effects from the injection site. She stated that she sleeps well at night. She does not want to have her medications changed at this time. We discussed about her symptoms during the holidays  and she reported that sometimes she becomes more depressed around the holiday season. However she is looking forward to have her brother and sister-in-law visiting her during the Thanksgiving. She is excited about the same. She currently denied having any suicidal homicidal ideations or plans. Her husband and kids remains supportive.      Patient will receive Gean Birchwood today.    Past Medical History:  Past Medical History:  Diagnosis Date  . Allergic rhinitis   . Anxiety   . Asthma   . Bipolar disorder (HCC)   . Depression   . Diabetes mellitus without complication (HCC)   . GERD (gastroesophageal reflux disease)   . Hyperlipidemia   . Mild sleep apnea   . OCD (obsessive compulsive disorder)   . Palpitations   . Paranoia (HCC)   . Schizoaffective disorder (HCC)   . Sleep apnea     Past Surgical History:  Procedure Laterality Date  . CESAREAN SECTION  2006/2008  . CHOLECYSTECTOMY  2006   Family History:  Family History  Problem Relation Age of Onset  . Diabetes Father   . Hyperlipidemia Father   . Diabetes Brother   . Lupus Mother   . Diabetes Mother        pre diabetes   Social History:   Social History   Social History  . Marital status: Married    Spouse name: N/A  . Number of children: N/A  . Years of education: N/A   Social History Main Topics  . Smoking status: Never Smoker  . Smokeless tobacco: Never Used  . Alcohol use No  . Drug use:  No  . Sexual activity: Yes    Partners: Male    Birth control/ protection: None, Surgical   Other Topics Concern  . None   Social History Narrative  . None   Additional Social History:  Married x 11 years. Has a good relationship with husband and has a 85 and 77 yo. 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 She stated that she does not smoke cigarettes and does not use any drugs or alcohol   Musculoskeletal: Strength & Muscle Tone: within normal limits Gait & Station: normal Patient  leans: N/A  Psychiatric Specialty Exam: Medication Refill  Pertinent negatives include no congestion or coughing.  Anxiety       Review of Systems  Constitutional: Positive for malaise/fatigue. Negative for weight loss.  HENT: Negative for congestion and hearing loss.   Eyes: Negative for photophobia.  Respiratory: Negative for cough and sputum production.   Genitourinary: Negative for urgency.  Psychiatric/Behavioral: Positive for depression and hallucinations.  All other systems reviewed and are negative.   Blood pressure 124/70, pulse 92, height 5' 2.25" (1.581 m), weight 245 lb (111.1 kg).Body mass index is 44.45 kg/m.  General Appearance: Casual and Fairly Groomed  Eye Contact:  Fair  Speech:  Clear and Coherent  Volume:  Normal  Mood:  Anxious and Depressed  Affect:  Congruent  Thought Process:  Coherent  Orientation:  Full (Time, Place, and Person)  Thought Content:  Obsessions and Paranoid Ideation  Suicidal Thoughts:  No  Homicidal Thoughts:  No  Memory:  Immediate;   Fair  Judgement:  Fair  Insight:  Fair  Psychomotor Activity:  Normal  Concentration:  Fair  Recall:  [employer] of Knowledge:Fair  Language: Fair  Akathisia:  No  Handed:  Right  AIMS (if indicated):  none  Assets:  Communication Skills Desire for Improvement Housing Intimacy Physical Health Social Support  ADL's:  Intact  Cognition: WNL  Sleep:  6-7    Is the patient at risk to self?  No. Has the patient been a risk to self in the past 6 months?  No. Has the patient been a risk to self within the distant past?  No. Is the patient a risk to others?  No. Has the patient been a risk to others in the past 6 months?  No. Has the patient been a risk to others within the distant past?  No.  Allergies:   Allergies  Allergen Reactions  . Abilify [Aripiprazole] Hives   Current Medications: Current Outpatient Prescriptions  Medication Sig Dispense Refill  . atorvastatin (LIPITOR) 20 MG  tablet TAKE 1 TABLET BY MOUTH EVERY NIGHT AT BEDTIME 30 tablet 12  . divalproex (DEPAKOTE) 250 MG DR tablet Take 1 tablet (250 mg total) by mouth every morning. 90 tablet 1  . divalproex (DEPAKOTE) 500 MG DR tablet Take 1 tablet (500 mg total) by mouth 1 day or 1 dose. 90 tablet 1  . doxycycline (VIBRAMYCIN) 50 MG capsule Take 1 capsule (50 mg total) by mouth every morning. 30 capsule 12  . fluticasone (FLOVENT HFA) 110 MCG/ACT inhaler Inhale 1 puff into the lungs 2 (two) times daily. 1 Inhaler 12  . glucose blood (ACCU-CHEK AVIVA PLUS) test strip 1 each by Other route as needed for other. Use as instructed 100 each 12  . hydrochlorothiazide (HYDRODIURIL) 12.5 MG tablet Take 1 tablet (12.5 mg total) by mouth daily. 90 tablet 0  . Insulin Detemir (LEVEMIR) 100 UNIT/ML Pen  Inject 40 Units into the skin 2 (two) times daily. 5 pen 11  . Insulin Pen Needle (BD PEN NEEDLE NANO U/F) 32G X 4 MM MISC 1 each by Does not apply route daily. 100 each 5  . metFORMIN (GLUCOPHAGE) 500 MG tablet TAKE 2 TABLETS BY MOUTH TWICE DAILY BEFORE A MEAL 180 tablet 3  . NOVOTWIST 32G X 5 MM MISC USE WITH LEVEMIR PEN BID  5  . omeprazole (PRILOSEC) 10 MG capsule TAKE 1 CAPSULE BY MOUTH EVERY DAY 30 capsule 5  . paliperidone (INVEGA) 6 MG 24 hr tablet Take 1 tablet (6 mg total) by mouth daily. 60 tablet 1  . pioglitazone (ACTOS) 45 MG tablet Take 1 tablet (45 mg total) by mouth daily. 30 tablet 12  . PROAIR HFA 108 (90 Base) MCG/ACT inhaler INHALE 2 PUFFS INTO THE LUNGS EVERY 6 HOURS AS NEEDED FOR WHEEZING OR SHORTNESS OF BREATH 8.5 g 6  . Vilazodone HCl (VIIBRYD) 40 MG TABS Take 1 tablet (40 mg total) by mouth every morning. 90 tablet 1  . canagliflozin (INVOKANA) 100 MG TABS tablet Take 1 tablet (100 mg total) by mouth daily before breakfast. (Patient not taking: Reported on 12/14/2016) 30 tablet 3  . Olopatadine HCl 0.2 % SOLN Use 1 drop in each eye daily for allergic eye symptoms, watery, itchy eyes (Patient not taking:  Reported on 12/14/2016) 2.5 mL 2  . paliperidone (INVEGA SUSTENNA) 234 MG/1.5ML SUSP injection Inject 234 mg into the muscle once. 1.5 mL 0   No current facility-administered medications for this visit.     Previous Psychotropic Medications:  Patient reported that she has previously tried Risperdal and Seroquel and Zyprexa Paxil and Celexa. She reported that she has history of adverse reaction to Abilify when she had hives. She stated that she has never tried Depakote or lithium and Tegretol. She denied any history of suicide attempts in the past.  Substance Abuse History in the last 12 months:  No.  Consequences of Substance Abuse: Negative NA  Medical Decision Making:  Review of Psycho-Social Stressors (1) and Established Problem, Worsening (2)  Treatment Plan Summary: Medication management    Continue   Invega Sustenna 234 mg IM.   Continue  oral Invega  6 mg daily  Continue Depakote 250 mg in the morning and 500 at bedtime. She will continue on Viibryd 40 mg daily    Patient is taking melatonin to help her sleep at night.  Follow-up in 1 month     More than 50% of the time spent in psychoeducation, counseling and coordination of care.     This note was generated in part or whole with voice recognition software. Voice regonition is usually quite accurate but there are transcription errors that can and very often do occur. I apologize for any typographical errors that were not detected and corrected.    Brandy Hale, MD    8/20/201811:12 AM

## 2016-12-15 ENCOUNTER — Ambulatory Visit: Payer: Medicare Other | Admitting: Psychiatry

## 2016-12-23 ENCOUNTER — Telehealth: Payer: Self-pay | Admitting: Family Medicine

## 2016-12-23 NOTE — Telephone Encounter (Signed)
Called pt to schedule for Annual Wellness Visit with Nurse Health Advisor, [place], my c/b # is [phone]  Manuela Schwartz 9/12?

## 2017-01-04 ENCOUNTER — Ambulatory Visit (INDEPENDENT_AMBULATORY_CARE_PROVIDER_SITE_OTHER): Payer: 59 | Admitting: Psychiatry

## 2017-01-04 ENCOUNTER — Ambulatory Visit: Payer: Medicare Other

## 2017-01-04 ENCOUNTER — Encounter: Payer: Self-pay | Admitting: Psychiatry

## 2017-01-04 VITALS — BP 127/83 | HR 99 | Temp 98.8°F | Wt 246.8 lb

## 2017-01-04 NOTE — Progress Notes (Signed)
Psychiatric Follow up MD/NP Note       PT NOT SEEN TODAY. This is an ERRONEOUS ENTRY.   Please Disregard.                Medication Refill  Pertinent negatives include no congestion or coughing.  Anxiety       Review of Systems  Constitutional: Positive for malaise/fatigue. Negative for weight loss.  HENT: Negative for congestion and hearing loss.   Eyes: Negative for photophobia.  Respiratory: Negative for cough and sputum production.   Genitourinary: Negative for urgency.  Psychiatric/Behavioral: Positive for depression and hallucinations.  All other systems reviewed and are negative.   Blood pressure 127/83, pulse 99, temperature 98.8 F (37.1 C), temperature source Oral, weight 246 lb 12.8 oz (111.9 kg).Body mass index is 44.78 kg/m.  General Appearance: Casual and Fairly Groomed  Eye Contact:  Fair  Speech:  Clear and Coherent  Volume:  Normal  Mood:  Anxious and Depressed  Affect:  Congruent  Thought Process:  Coherent  Orientation:  Full (Time, Place, and Person)  Thought Content:  Obsessions and Paranoid Ideation  Suicidal Thoughts:  No  Homicidal Thoughts:  No  Memory:  Immediate;   Fair  Judgement:  Fair  Insight:  Fair  Psychomotor Activity:  Normal  Concentration:  Fair  Recall:  [employer] of Knowledge:Fair  Language: Fair  Akathisia:  No  Handed:  Right  AIMS (if indicated):  none  Assets:  Communication Skills Desire for Improvement Housing Intimacy Physical Health Social Support  ADL's:  Intact  Cognition: WNL  Sleep:  6-7    Is the patient at risk to self?  No. Has the patient been a risk to self in the past 6 months?  No. Has the patient been a risk to self within the distant past?  No. Is the patient a risk to others?  No. Has the patient been a risk to others in the past 6 months?  No. Has the patient been a risk to others within the distant past?  No.  Allergies:   Allergies  Allergen Reactions  . Abilify  [Aripiprazole] Hives   Current Medications: Current Outpatient Prescriptions  Medication Sig Dispense Refill  . atorvastatin (LIPITOR) 20 MG tablet TAKE 1 TABLET BY MOUTH EVERY NIGHT AT BEDTIME 30 tablet 12  . canagliflozin (INVOKANA) 100 MG TABS tablet Take 1 tablet (100 mg total) by mouth daily before breakfast. 30 tablet 3  . divalproex (DEPAKOTE) 250 MG DR tablet Take 1 tablet (250 mg total) by mouth every morning. 90 tablet 1  . divalproex (DEPAKOTE) 500 MG DR tablet Take 1 tablet (500 mg total) by mouth 1 day or 1 dose. 90 tablet 1  . doxycycline (VIBRAMYCIN) 50 MG capsule Take 1 capsule (50 mg total) by mouth every morning. 30 capsule 12  . fluticasone (FLOVENT HFA) 110 MCG/ACT inhaler Inhale 1 puff into the lungs 2 (two) times daily. 1 Inhaler 12  . glucose blood (ACCU-CHEK AVIVA PLUS) test strip 1 each by Other route as needed for other. Use as instructed 100 each 12  . hydrochlorothiazide (HYDRODIURIL) 12.5 MG tablet Take 1 tablet (12.5 mg total) by mouth daily. 90 tablet 0  . Insulin Detemir (LEVEMIR) 100 UNIT/ML Pen Inject 40 Units into the skin 2 (two) times daily. 5 pen 11  . Insulin Pen Needle (BD PEN NEEDLE NANO U/F) 32G X 4 MM MISC 1 each by Does not apply route daily. 100 each 5  .  metFORMIN (GLUCOPHAGE) 500 MG tablet TAKE 2 TABLETS BY MOUTH TWICE DAILY BEFORE A MEAL 180 tablet 3  . NOVOTWIST 32G X 5 MM MISC USE WITH LEVEMIR PEN BID  5  . Olopatadine HCl 0.2 % SOLN Use 1 drop in each eye daily for allergic eye symptoms, watery, itchy eyes 2.5 mL 2  . omeprazole (PRILOSEC) 10 MG capsule TAKE 1 CAPSULE BY MOUTH EVERY DAY 30 capsule 5  . paliperidone (INVEGA) 6 MG 24 hr tablet Take 1 tablet (6 mg total) by mouth daily. 60 tablet 1  . pioglitazone (ACTOS) 45 MG tablet Take 1 tablet (45 mg total) by mouth daily. 30 tablet 12  . PROAIR HFA 108 (90 Base) MCG/ACT inhaler INHALE 2 PUFFS INTO THE LUNGS EVERY 6 HOURS AS NEEDED FOR WHEEZING OR SHORTNESS OF BREATH 8.5 g 6  . Vilazodone HCl  (VIIBRYD) 40 MG TABS Take 1 tablet (40 mg total) by mouth every morning. 90 tablet 1  . paliperidone (INVEGA SUSTENNA) 234 MG/1.5ML SUSP injection Inject 234 mg into the muscle once. 1.5 mL 0   No current facility-administered medications for this visit.

## 2017-01-07 ENCOUNTER — Other Ambulatory Visit: Payer: Self-pay | Admitting: Family Medicine

## 2017-01-07 ENCOUNTER — Telehealth: Payer: Self-pay | Admitting: Family Medicine

## 2017-01-07 DIAGNOSIS — E119 Type 2 diabetes mellitus without complications: Secondary | ICD-10-CM

## 2017-01-07 NOTE — Telephone Encounter (Signed)
Refill sent.

## 2017-01-11 ENCOUNTER — Ambulatory Visit: Payer: Medicare Other

## 2017-01-11 ENCOUNTER — Ambulatory Visit: Payer: Medicare Other | Admitting: Psychiatry

## 2017-01-13 ENCOUNTER — Ambulatory Visit (INDEPENDENT_AMBULATORY_CARE_PROVIDER_SITE_OTHER): Payer: Medicare Other

## 2017-01-13 VITALS — BP 123/74 | HR 82 | Temp 97.4°F | Resp 17 | Ht 63.0 in | Wt 247.0 lb

## 2017-01-13 DIAGNOSIS — Z0000 Encounter for general adult medical examination without abnormal findings: Secondary | ICD-10-CM

## 2017-01-13 DIAGNOSIS — Z23 Encounter for immunization: Secondary | ICD-10-CM

## 2017-01-13 NOTE — Progress Notes (Signed)
Subjective:   Tabitha [name] is a 46 y.o. female who presents for an Initial Medicare Annual Wellness Visit.  Review of Systems     Cardiac Risk Factors include: advanced age (>72men, >8 women);obesity (BMI >30kg/m2);dyslipidemia;hypertension;diabetes mellitus     Objective:    Today's Vitals   01/13/17 1505  BP: 123/74  Pulse: 82  Resp: 17  Temp: (!) 97.4 F (36.3 C)  Weight: 247 lb (112 kg)  Height:  (1.6 m)   Body mass index is 43.75 kg/m.   Current Medications (verified) Outpatient Encounter Prescriptions as of 01/13/2017  Medication Sig  . atorvastatin (LIPITOR) 20 MG tablet TAKE 1 TABLET BY MOUTH EVERY NIGHT AT BEDTIME  . divalproex (DEPAKOTE) 250 MG DR tablet Take 1 tablet (250 mg total) by mouth every morning.  . divalproex (DEPAKOTE) 500 MG DR tablet Take 1 tablet (500 mg total) by mouth 1 day or 1 dose.  Marland Kitchen doxycycline (VIBRAMYCIN) 50 MG capsule Take 1 capsule (50 mg total) by mouth every morning.  . fluticasone (FLOVENT HFA) 110 MCG/ACT inhaler Inhale 1 puff into the lungs 2 (two) times daily.  Marland Kitchen glucose blood (ACCU-CHEK AVIVA PLUS) test strip 1 each by Other route as needed for other. Use as instructed  . hydrochlorothiazide (HYDRODIURIL) 12.5 MG tablet Take 1 tablet (12.5 mg total) by mouth daily.  . Insulin Detemir (LEVEMIR) 100 UNIT/ML Pen Inject 40 Units into the skin 2 (two) times daily.  . Insulin Pen Needle (BD PEN NEEDLE NANO U/F) 32G X 4 MM MISC 1 each by Does not apply route daily.  . metFORMIN (GLUCOPHAGE) 500 MG tablet TAKE 2 TABLETS BY MOUTH TWICE DAILY BEFORE A MEAL  . NOVOTWIST 32G X 5 MM MISC USE WITH LEVEMIR PEN BID  . Olopatadine HCl 0.2 % SOLN Use 1 drop in each eye daily for allergic eye symptoms, watery, itchy eyes  . omeprazole (PRILOSEC) 10 MG capsule TAKE 1 CAPSULE BY MOUTH EVERY DAY  . paliperidone (INVEGA) 6 MG 24 hr tablet Take 1 tablet (6 mg total) by mouth daily.  . pioglitazone (ACTOS) 45 MG tablet Take 1 tablet (45 mg  total) by mouth daily.  Marland Kitchen PROAIR HFA 108 (90 Base) MCG/ACT inhaler INHALE 2 PUFFS INTO THE LUNGS EVERY 6 HOURS AS NEEDED FOR WHEEZING OR SHORTNESS OF BREATH  . Vilazodone HCl (VIIBRYD) 40 MG TABS Take 1 tablet (40 mg total) by mouth every morning.  . paliperidone (INVEGA SUSTENNA) 234 MG/1.5ML SUSP injection Inject 234 mg into the muscle once.  . [DISCONTINUED] canagliflozin (INVOKANA) 100 MG TABS tablet Take 1 tablet (100 mg total) by mouth daily before breakfast. (Patient not taking: Reported on 01/13/2017)   No facility-administered encounter medications on file as of 01/13/2017.     Allergies (verified) Abilify [aripiprazole]   History: Past Medical History:  Diagnosis Date  . Allergic rhinitis   . Anxiety   . Asthma   . Bipolar disorder (HCC)   . Depression   . Diabetes mellitus without complication (HCC)   . GERD (gastroesophageal reflux disease)   . Hyperlipidemia   . Mild sleep apnea   . OCD (obsessive compulsive disorder)   . Palpitations   . Paranoia (HCC)   . Schizoaffective disorder (HCC)   . Sleep apnea    Past Surgical History:  Procedure Laterality Date  . CESAREAN SECTION  2006/2008  . CHOLECYSTECTOMY  2006   Family History  Problem Relation Age of Onset  . Diabetes Father   . Hyperlipidemia  Father   . Diabetes Brother   . Lupus Mother   . Diabetes Mother        pre diabetes   Social History   Occupational History  . Not on file.   Social History Main Topics  . Smoking status: Never Smoker  . Smokeless tobacco: Never Used  . Alcohol use No  . Drug use: No  . Sexual activity: Yes    Partners: Male    Birth control/ protection: None, Surgical    Tobacco Counseling Counseling given: Not Answered   Activities of Daily Living In your present state of health, do you have any difficulty performing the following activities: 01/13/2017 09/07/2016  Hearing? Y N  Vision? N N  Difficulty concentrating or making decisions? Y N  Walking or climbing  stairs? N N  Dressing or bathing? N N  Doing errands, shopping? N N  Preparing Food and eating ? N -  Using the Toilet? N -  In the past six months, have you accidently leaked urine? Y -  Comment wears pads -  Do you have problems with loss of bowel control? N -  Managing your Medications? N -  Managing your Finances? N -  Housekeeping or managing your Housekeeping? N -  Some recent data might be hidden    Immunizations and Health Maintenance Immunization History  Administered Date(s) Administered  . Influenza,inj,Quad PF,6+ Mos 01/16/2016, 01/13/2017  . Influenza-Unspecified 02/07/2015  . Pneumococcal Conjugate-13 04/28/2015  . Tdap 01/23/2015   Health Maintenance Due  Topic Date Due  . FOOT EXAM  04/10/1982    Patient Care Team: Particia Nearing, PA-C as PCP - General (Family Medicine) Brandy Hale, MD as Referring Physician (Psychiatry) Cassell Clement, MD as Referring Physician (Obstetrics and Gynecology)  Indicate any recent Medical Services you may have received from other than [facility] providers in the past year (date may be approximate).     Assessment:   This is a routine wellness examination for Tabitha [name].   Hearing/Vision screen Vision Screening Comments: Sees Dr.Woodard annually  Dietary issues and exercise activities discussed: Current Exercise Habits: The patient does not participate in regular exercise at present, Exercise limited by: None identified  Goals    None     Depression Screen PHQ 2/9 Scores 01/13/2017 09/24/2015 06/20/2015 03/15/2015 12/03/2014  PHQ - 2 Score 2 3 0 0 0  PHQ- 9 Score 12 8 - - -  Some encounter information is confidential and restricted. Go to Review Flowsheets activity to see all data.    Fall Risk Fall Risk  01/13/2017 09/24/2015 06/20/2015 03/15/2015 12/03/2014  Falls in the past year? No No No No No    Cognitive Function:     6CIT Screen 01/13/2017  What Year? 0 points  What month? 0 points  What time? 0 points    Count back from 20 0 points  Months in reverse 0 points  Repeat phrase 4 points  Total Score 4    Screening Tests Health Maintenance  Topic Date Due  . FOOT EXAM  04/10/1982  . HIV Screening  01/15/2017 (Originally 04/11/1987)  . OPHTHALMOLOGY EXAM  02/23/2017 (Originally 03/28/2015)  . HEMOGLOBIN A1C  04/24/2017  . PAP SMEAR  01/24/2018  . PNEUMOCOCCAL POLYSACCHARIDE VACCINE (2) 04/27/2020  . TETANUS/TDAP  01/25/2025  . INFLUENZA VACCINE  Completed      Plan:    I have personally reviewed and addressed the Medicare Annual Wellness questionnaire and have noted the following in the patient's chart:  A. Medical and social history B. Use of alcohol, tobacco or illicit drugs  C. Current medications and supplements D. Functional ability and status E.  Nutritional status F.  Physical activity G. Advance directives H. List of other physicians I.  Hospitalizations, surgeries, and ER visits in previous 12 months J.  Vitals K. Screenings such as hearing and vision if needed, cognitive and depression L. Referrals and appointments   In addition, I have reviewed and discussed with patient certain preventive protocols, quality metrics, and best practice recommendations. A written personalized care plan for preventive services as well as general preventive health recommendations were provided to patient.   Signed,  Marin Roberts, LPN Nurse Health Advisor   MD Recommendations: due for diabetic foot exam.

## 2017-01-13 NOTE — Patient Instructions (Addendum)
Tabitha [name] , Thank you for taking time to come for your Medicare Wellness Visit. I appreciate your ongoing commitment to your health goals. Please review the following plan we discussed and let me know if I can assist you in the future.   Screening recommendations/referrals: Colonoscopy: due at age 45 Mammogram: completed 07/26/2013 Bone Density: due at age 65 Recommended yearly ophthalmology/optometry visit for glaucoma screening and checkup Recommended yearly dental visit for hygiene and checkup  Vaccinations: Influenza vaccine: done today  Pneumococcal vaccine: up to date Tdap vaccine: up to date Shingles vaccine: due at age 45  Advanced directives: Advance directive discussed with you today. I have provided a copy for you to complete at home and have notarized. Once this is complete please bring a copy in to our office so we can scan it into your chart.  Conditions/risks identified:  none  Next appointment: Follow up on 01/18/2017 at 1:30pm with Wynona Dove. FOllow up in one year for your annual wellness exam.   Preventive Care 40-64 Years, Female Preventive care refers to lifestyle choices and visits with your health care provider that can promote health and wellness. What does preventive care include?  A yearly physical exam. This is also called an annual well check.  Dental exams once or twice a year.  Routine eye exams. Ask your health care provider how often you should have your eyes checked.  Personal lifestyle choices, including:  Daily care of your teeth and gums.  Regular physical activity.  Eating a healthy diet.  Avoiding tobacco and drug use.  Limiting alcohol use.  Practicing safe sex.  Taking low-dose aspirin daily starting at age 62.  Taking vitamin and mineral supplements as recommended by your health care provider. What happens during an annual well check? The services and screenings done by your health care provider during your annual well check  will depend on your age, overall health, lifestyle risk factors, and family history of disease. Counseling  Your health care provider may ask you questions about your:  Alcohol use.  Tobacco use.  Drug use.  Emotional well-being.  Home and relationship well-being.  Sexual activity.  Eating habits.  Work and work Statistician.  Method of birth control.  Menstrual cycle.  Pregnancy history. Screening  You may have the following tests or measurements:  Height, weight, and BMI.  Blood pressure.  Lipid and cholesterol levels. These may be checked every 5 years, or more frequently if you are over 33 years old.  Skin check.  Lung cancer screening. You may have this screening every year starting at age 82 if you have a 30-pack-year history of smoking and currently smoke or have quit within the past 15 years.  Fecal occult blood test (FOBT) of the stool. You may have this test every year starting at age 64.  Flexible sigmoidoscopy or colonoscopy. You may have a sigmoidoscopy every 5 years or a colonoscopy every 10 years starting at age 64.  Hepatitis C blood test.  Hepatitis B blood test.  Sexually transmitted disease (STD) testing.  Diabetes screening. This is done by checking your blood sugar (glucose) after you have not eaten for a while (fasting). You may have this done every 1-3 years.  Mammogram. This may be done every 1-2 years. Talk to your health care provider about when you should start having regular mammograms. This may depend on whether you have a family history of breast cancer.  BRCA-related cancer screening. This may be done if you have a  family history of breast, ovarian, tubal, or peritoneal cancers.  Pelvic exam and Pap test. This may be done every 3 years starting at age 16. Starting at age 14, this may be done every 5 years if you have a Pap test in combination with an HPV test.  Bone density scan. This is done to screen for osteoporosis. You may  have this scan if you are at high risk for osteoporosis. Discuss your test results, treatment options, and if necessary, the need for more tests with your health care provider. Vaccines  Your health care provider may recommend certain vaccines, such as:  Influenza vaccine. This is recommended every year.  Tetanus, diphtheria, and acellular pertussis (Tdap, Td) vaccine. You may need a Td booster every 10 years.  Zoster vaccine. You may need this after age 80.  Pneumococcal 13-valent conjugate (PCV13) vaccine. You may need this if you have certain conditions and were not previously vaccinated.  Pneumococcal polysaccharide (PPSV23) vaccine. You may need one or two doses if you smoke cigarettes or if you have certain conditions. Talk to your health care provider about which screenings and vaccines you need and how often you need them. This information is not intended to replace advice given to you by your health care provider. Make sure you discuss any questions you have with your health care provider. Document Released: 05/10/2015 Document Revised: 01/01/2016 Document Reviewed: 02/12/2015 Elsevier Interactive Patient Education  2017 [place] Prevention in the Home Falls can cause injuries. They can happen to people of all ages. There are many things you can do to make your home safe and to help prevent falls. What can I do on the outside of my home?  Regularly fix the edges of walkways and driveways and fix any cracks.  Remove anything that might make you trip as you walk through a door, such as a raised step or threshold.  Trim any bushes or trees on the path to your home.  Use bright outdoor lighting.  Clear any walking paths of anything that might make someone trip, such as rocks or tools.  Regularly check to see if handrails are loose or broken. Make sure that both sides of any steps have handrails.  Any raised decks and porches should have guardrails on the  edges.  Have any leaves, snow, or ice cleared regularly.  Use sand or salt on walking paths during winter.  Clean up any spills in your garage right away. This includes oil or grease spills. What can I do in the bathroom?  Use night lights.  Install grab bars by the toilet and in the tub and shower. Do not use towel bars as grab bars.  Use non-skid mats or decals in the tub or shower.  If you need to sit down in the shower, use a plastic, non-slip stool.  Keep the floor dry. Clean up any water that spills on the floor as soon as it happens.  Remove soap buildup in the tub or shower regularly.  Attach bath mats securely with double-sided non-slip rug tape.  Do not have throw rugs and other things on the floor that can make you trip. What can I do in the bedroom?  Use night lights.  Make sure that you have a light by your bed that is easy to reach.  Do not use any sheets or blankets that are too big for your bed. They should not hang down onto the floor.  Have a firm  chair that has side arms. You can use this for support while you get dressed.  Do not have throw rugs and other things on the floor that can make you trip. What can I do in the kitchen?  Clean up any spills right away.  Avoid walking on wet floors.  Keep items that you use a lot in easy-to-reach places.  If you need to reach something above you, use a strong step stool that has a grab bar.  Keep electrical cords out of the way.  Do not use floor polish or wax that makes floors slippery. If you must use wax, use non-skid floor wax.  Do not have throw rugs and other things on the floor that can make you trip. What can I do with my stairs?  Do not leave any items on the stairs.  Make sure that there are handrails on both sides of the stairs and use them. Fix handrails that are broken or loose. Make sure that handrails are as long as the stairways.  Check any carpeting to make sure that it is firmly  attached to the stairs. Fix any carpet that is loose or worn.  Avoid having throw rugs at the top or bottom of the stairs. If you do have throw rugs, attach them to the floor with carpet tape.  Make sure that you have a light switch at the top of the stairs and the bottom of the stairs. If you do not have them, ask someone to add them for you. What else can I do to help prevent falls?  Wear shoes that:  Do not have high heels.  Have rubber bottoms.  Are comfortable and fit you well.  Are closed at the toe. Do not wear sandals.  If you use a stepladder:  Make sure that it is fully opened. Do not climb a closed stepladder.  Make sure that both sides of the stepladder are locked into place.  Ask someone to hold it for you, if possible.  Clearly mark and make sure that you can see:  Any grab bars or handrails.  First and last steps.  Where the edge of each step is.  Use tools that help you move around (mobility aids) if they are needed. These include:  Canes.  Walkers.  Scooters.  Crutches.  Turn on the lights when you go into a dark area. Replace any light bulbs as soon as they burn out.  Set up your furniture so you have a clear path. Avoid moving your furniture around.  If any of your floors are uneven, fix them.  If there are any pets around you, be aware of where they are.  Review your medicines with your doctor. Some medicines can make you feel dizzy. This can increase your chance of falling. Ask your doctor what other things that you can do to help prevent falls. This information is not intended to replace advice given to you by your health care provider. Make sure you discuss any questions you have with your health care provider. Document Released: 02/07/2009 Document Revised: 09/19/2015 Document Reviewed: 05/18/2014 Elsevier Interactive Patient Education  2017 [place].   Influenza (Flu) Vaccine (Inactivated or Recombinant): What You Need to  Know 1. Why get vaccinated? Influenza ("flu") is a contagious disease that spreads around the [place] every year, usually between October and May. Flu is caused by influenza viruses, and is spread mainly by coughing, sneezing, and close contact. Anyone can get flu. Flu strikes  suddenly and can last several days. Symptoms vary by age, but can include:  fever/chills  sore throat  muscle aches  fatigue  cough  headache  runny or stuffy nose  Flu can also lead to pneumonia and blood infections, and cause diarrhea and seizures in children. If you have a medical condition, such as heart or lung disease, flu can make it worse. Flu is more dangerous for some people. Infants and young children, people 103 years of age and older, pregnant women, and people with certain health conditions or a weakened immune system are at greatest risk. Each year thousands of people in the [place] die from flu, and many more are hospitalized. Flu vaccine can:  keep you from getting flu,  make flu less severe if you do get it, and  keep you from spreading flu to your family and other people. 2. Inactivated and recombinant flu vaccines A dose of flu vaccine is recommended every flu season. Children 6 months through 4 years of age may need two doses during the same flu season. Everyone else needs only one dose each flu season. Some inactivated flu vaccines contain a very small amount of a mercury-based preservative called thimerosal. Studies have not shown thimerosal in vaccines to be harmful, but flu vaccines that do not contain thimerosal are available. There is no live flu virus in flu shots. They cannot cause the flu. There are many flu viruses, and they are always changing. Each year a new flu vaccine is made to protect against three or four viruses that are likely to cause disease in the upcoming flu season. But even when the vaccine doesn't exactly match these viruses, it may still provide some  protection. Flu vaccine cannot prevent:  flu that is caused by a virus not covered by the vaccine, or  illnesses that look like flu but are not.  It takes about 2 weeks for protection to develop after vaccination, and protection lasts through the flu season. 3. Some people should not get this vaccine Tell the person who is giving you the vaccine:  If you have any severe, life-threatening allergies. If you ever had a life-threatening allergic reaction after a dose of flu vaccine, or have a severe allergy to any part of this vaccine, you may be advised not to get vaccinated. Most, but not all, types of flu vaccine contain a small amount of egg protein.  If you ever had Guillain-Barr Syndrome (also called GBS). Some people with a history of GBS should not get this vaccine. This should be discussed with your doctor.  If you are not feeling well. It is usually okay to get flu vaccine when you have a mild illness, but you might be asked to come back when you feel better.  4. Risks of a vaccine reaction With any medicine, including vaccines, there is a chance of reactions. These are usually mild and go away on their own, but serious reactions are also possible. Most people who get a flu shot do not have any problems with it. Minor problems following a flu shot include:  soreness, redness, or swelling where the shot was given  hoarseness  sore, red or itchy eyes  cough  fever  aches  headache  itching  fatigue  If these problems occur, they usually begin soon after the shot and last 1 or 2 days. More serious problems following a flu shot can include the following:  There may be a small increased risk of Guillain-Barre Syndrome (  GBS) after inactivated flu vaccine. This risk has been estimated at 1 or 2 additional cases per million people vaccinated. This is much lower than the risk of severe complications from flu, which can be prevented by flu vaccine.  Young children who get  the flu shot along with pneumococcal vaccine (PCV13) and/or DTaP vaccine at the same time might be slightly more likely to have a seizure caused by fever. Ask your doctor for more information. Tell your doctor if a child who is getting flu vaccine has ever had a seizure.  Problems that could happen after any injected vaccine:  People sometimes faint after a medical procedure, including vaccination. Sitting or lying down for about 15 minutes can help prevent fainting, and injuries caused by a fall. Tell your doctor if you feel dizzy, or have vision changes or ringing in the ears.  Some people get severe pain in the shoulder and have difficulty moving the arm where a shot was given. This happens very rarely.  Any medication can cause a severe allergic reaction. Such reactions from a vaccine are very rare, estimated at about 1 in a million doses, and would happen within a few minutes to a few hours after the vaccination. As with any medicine, there is a very remote chance of a vaccine causing a serious injury or death. The safety of vaccines is always being monitored. For more information, visit: http://www.aguilar.org/ 5. What if there is a serious reaction? What should I look for? Look for anything that concerns you, such as signs of a severe allergic reaction, very high fever, or unusual behavior. Signs of a severe allergic reaction can include hives, swelling of the face and throat, difficulty breathing, a fast heartbeat, dizziness, and weakness. These would start a few minutes to a few hours after the vaccination. What should I do?  If you think it is a severe allergic reaction or other emergency that can't wait, call 9-1-1 and get the person to the nearest hospital. Otherwise, call your doctor.  Reactions should be reported to the Vaccine Adverse Event Reporting System (VAERS). Your doctor should file this report, or you can do it yourself through the VAERS web site at www.vaers.SamedayNews.es, or  by calling 385-009-1709. ? VAERS does not give medical advice. 6. The National Vaccine Injury Compensation Program The [employer] Vaccine Injury Compensation Program (VICP) is a federal program that was created to compensate people who may have been injured by certain vaccines. Persons who believe they may have been injured by a vaccine can learn about the program and about filing a claim by calling (681)885-3265 or visiting the [place] website at GoldCloset.com.ee. There is a time limit to file a claim for compensation. 7. How can I learn more?  Ask your healthcare provider. He or she can give you the vaccine package insert or suggest other sources of information.  Call your local or state health department.  Contact the Centers for Disease Control and Prevention (CDC): ? Call 385-536-8203 (1-800-CDC-INFO) or ? Visit CDC's website at https://gibson.com/ Vaccine Information Statement, Inactivated Influenza Vaccine (12/01/2013) This information is not intended to replace advice given to you by your health care provider. Make sure you discuss any questions you have with your health care provider. Document Released: 02/05/2006 Document Revised: 01/02/2016 Document Reviewed: 01/02/2016 Elsevier Interactive Patient Education  2017 [employer].

## 2017-01-15 ENCOUNTER — Other Ambulatory Visit: Payer: Self-pay | Admitting: Family Medicine

## 2017-01-15 NOTE — Telephone Encounter (Signed)
Routing to provider. Appt on Monday.

## 2017-01-18 ENCOUNTER — Ambulatory Visit (INDEPENDENT_AMBULATORY_CARE_PROVIDER_SITE_OTHER): Payer: 59 | Admitting: Psychiatry

## 2017-01-18 ENCOUNTER — Ambulatory Visit (INDEPENDENT_AMBULATORY_CARE_PROVIDER_SITE_OTHER): Payer: Medicare Other | Admitting: Family Medicine

## 2017-01-18 ENCOUNTER — Ambulatory Visit (INDEPENDENT_AMBULATORY_CARE_PROVIDER_SITE_OTHER): Payer: Medicare Other

## 2017-01-18 ENCOUNTER — Encounter: Payer: Self-pay | Admitting: Family Medicine

## 2017-01-18 ENCOUNTER — Encounter: Payer: Self-pay | Admitting: Psychiatry

## 2017-01-18 VITALS — BP 104/67 | HR 85 | Temp 98.2°F | Ht 63.0 in | Wt 248.8 lb

## 2017-01-18 VITALS — BP 136/82 | HR 96 | Temp 97.8°F | Wt 248.4 lb

## 2017-01-18 DIAGNOSIS — G8929 Other chronic pain: Secondary | ICD-10-CM | POA: Diagnosis not present

## 2017-01-18 DIAGNOSIS — M25561 Pain in right knee: Secondary | ICD-10-CM | POA: Diagnosis not present

## 2017-01-18 DIAGNOSIS — F429 Obsessive-compulsive disorder, unspecified: Secondary | ICD-10-CM | POA: Diagnosis not present

## 2017-01-18 DIAGNOSIS — F250 Schizoaffective disorder, bipolar type: Secondary | ICD-10-CM

## 2017-01-18 DIAGNOSIS — Z794 Long term (current) use of insulin: Secondary | ICD-10-CM

## 2017-01-18 DIAGNOSIS — I10 Essential (primary) hypertension: Secondary | ICD-10-CM

## 2017-01-18 DIAGNOSIS — E1140 Type 2 diabetes mellitus with diabetic neuropathy, unspecified: Secondary | ICD-10-CM | POA: Diagnosis not present

## 2017-01-18 DIAGNOSIS — S9032XA Contusion of left foot, initial encounter: Secondary | ICD-10-CM | POA: Diagnosis not present

## 2017-01-18 LAB — BAYER DCA HB A1C WAIVED: HB A1C: 7.3 % — AB (ref <7.0)

## 2017-01-18 MED ORDER — GABAPENTIN 300 MG PO CAPS: 300.0000 mg | ORAL_CAPSULE | Freq: Three times a day (TID) | ORAL | 3 refills | Status: DC

## 2017-01-18 MED ORDER — PALIPERIDONE PALMITATE 234 MG/1.5ML IM SUSP
234.0000 mg | Freq: Once | INTRAMUSCULAR | Status: AC
  Administered 2017-01-18: 234 mg via INTRAMUSCULAR

## 2017-01-18 MED ORDER — DICLOFENAC SODIUM 1 % TD GEL: 2.0000 g | Freq: Two times a day (BID) | TRANSDERMAL | 0 refills | Status: DC | PRN

## 2017-01-18 MED ORDER — PALIPERIDONE PALMITATE 234 MG/1.5ML IM SUSP: 234.0000 mg | Freq: Once | INTRAMUSCULAR | 0 refills | Status: DC

## 2017-01-18 NOTE — Telephone Encounter (Signed)
Pt was seen today 01-18-17 by dr. Garnetta Buddy and nurse for injection.

## 2017-01-18 NOTE — Progress Notes (Signed)
Psychiatric Follow up MD/NP Note   Patient Identification: Tabitha [name] MRN:  [id number] Date of Evaluation:  01/18/2017 Referral Source: [facility].  Chief Complaint:   Chief Complaint    Follow-up; Medication Refill     Visit Diagnosis:    ICD-10-CM   1. Schizoaffective disorder, bipolar type (HCC) F25.0   2. Obsessive-compulsive disorder, unspecified type F42.9    Diagnosis:   Patient Active Problem List   Diagnosis Date Noted  . GERD (gastroesophageal reflux disease) [K21.9]   . Palpitations [R00.2]   . Bipolar disorder (HCC) [F31.9]   . Schizoaffective disorder (HCC) [F25.9]   . Intermittent palpitations [R00.2] 09/07/2016  . Frequent headaches [R51] 09/07/2016  . 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 Asthma [J45.909] 06/15/2016  . Acne rosacea [L71.9] 06/15/2016  . Allergic conjunctivitis of both eyes [H10.13] 05/08/2016  . Obesity [E66.9] 09/24/2015  . Diabetes mellitus type 2, controlled, without complications (HCC) [E11.9] 12/03/2014  . HBP (high blood pressure) [I10] 12/03/2014  . Hyperlipidemia [E78.5] 12/03/2014  . Bipolar disorder in partial remission (HCC) [F31.70] 12/03/2014  . GERD without esophagitis [K21.9] 12/03/2014   History of Present Illness:    Patient is a 46 year-old married female who presented for follow-up.Patient reported that she has started noticing Improvement in her symptoms.  She appeared calm and alert during the interview. She reported that the medications are helping her and she is able to disregard the paranoia.. She reported that she is able to distinguish between the paranoia and the real symptoms. She does not feel that there are cameras in the house. Patient stated that she has good relationship with her family. She is also trying to lose weight and has been doing the exercise bike. Patient reported that she has noticed that her symptoms are getting better and she is feeling more energetic. She is sleeping  well at night. She currently denied having any suicidal ideations or plans. Patient reported that her husband is supportive. She is compliant with her medications.   Patient will receive Gean Birchwood today.    Past Medical History:  Past Medical History:  Diagnosis Date  . Allergic rhinitis   . Anxiety   . Asthma   . Bipolar disorder (HCC)   . Depression   . Diabetes mellitus without complication (HCC)   . GERD (gastroesophageal reflux disease)   . Hyperlipidemia   . Mild sleep apnea   . OCD (obsessive compulsive disorder)   . Palpitations   . Paranoia (HCC)   . Schizoaffective disorder (HCC)   . Sleep apnea     Past Surgical History:  Procedure Laterality Date  . CESAREAN SECTION  2006/2008  . CHOLECYSTECTOMY  2006   Family History:  Family History  Problem Relation Age of Onset  . Diabetes Father   . Hyperlipidemia Father   . Diabetes Brother   . Lupus Mother   . Diabetes Mother        pre diabetes   Social History:   Social History   Social History  . Marital status: Married    Spouse name: N/A  . Number of children: N/A  . Years of education: N/A   Social History Main Topics  . Smoking status: Never Smoker  . Smokeless tobacco: Never Used  . Alcohol use No  . Drug use: No  . Sexual activity: Yes    Partners: Male    Birth control/ protection: None, Surgical   Other Topics Concern  . None   Social History  Narrative  . None   Additional Social History:  Married x 11 years. Has a good relationship with husband and has a 28 and 42 yo. 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 She stated that she does not smoke cigarettes and does not use any drugs or alcohol   Musculoskeletal: Strength & Muscle Tone: within normal limits Gait & Station: normal Patient leans: N/A  Psychiatric Specialty Exam: Medication Refill  Pertinent negatives include no congestion or coughing.  Anxiety       Review of Systems  Constitutional:  Positive for malaise/fatigue. Negative for weight loss.  HENT: Negative for congestion and hearing loss.   Eyes: Negative for photophobia.  Respiratory: Negative for cough and sputum production.   Genitourinary: Negative for urgency.  Psychiatric/Behavioral: Positive for depression and hallucinations.  All other systems reviewed and are negative.   Blood pressure 136/82, pulse 96, temperature 97.8 F (36.6 C), temperature source Oral, weight 248 lb 6.4 oz (112.7 kg).Body mass index is 44 kg/m.  General Appearance: Casual and Fairly Groomed  Eye Contact:  Fair  Speech:  Clear and Coherent  Volume:  Normal  Mood:  Anxious and Depressed  Affect:  Congruent  Thought Process:  Coherent  Orientation:  Full (Time, Place, and Person)  Thought Content:  Obsessions and Paranoid Ideation  Suicidal Thoughts:  No  Homicidal Thoughts:  No  Memory:  Immediate;   Fair  Judgement:  Fair  Insight:  Fair  Psychomotor Activity:  Normal  Concentration:  Fair  Recall:  [employer] of Knowledge:Fair  Language: Fair  Akathisia:  No  Handed:  Right  AIMS (if indicated):  none  Assets:  Communication Skills Desire for Improvement Housing Intimacy Physical Health Social Support  ADL's:  Intact  Cognition: WNL  Sleep:  6-7    Is the patient at risk to self?  No. Has the patient been a risk to self in the past 6 months?  No. Has the patient been a risk to self within the distant past?  No. Is the patient a risk to others?  No. Has the patient been a risk to others in the past 6 months?  No. Has the patient been a risk to others within the distant past?  No.  Allergies:   Allergies  Allergen Reactions  . Abilify [Aripiprazole] Hives   Current Medications: Current Outpatient Prescriptions  Medication Sig Dispense Refill  . atorvastatin (LIPITOR) 20 MG tablet TAKE 1 TABLET BY MOUTH EVERY NIGHT AT BEDTIME 30 tablet 12  . divalproex (DEPAKOTE) 250 MG DR tablet Take 1 tablet (250 mg total) by  mouth every morning. 90 tablet 1  . divalproex (DEPAKOTE) 500 MG DR tablet Take 1 tablet (500 mg total) by mouth 1 day or 1 dose. 90 tablet 1  . doxycycline (VIBRAMYCIN) 50 MG capsule Take 1 capsule (50 mg total) by mouth every morning. 30 capsule 12  . fluticasone (FLOVENT HFA) 110 MCG/ACT inhaler Inhale 1 puff into the lungs 2 (two) times daily. 1 Inhaler 12  . glucose blood (ACCU-CHEK AVIVA PLUS) test strip 1 each by Other route as needed for other. Use as instructed 100 each 12  . hydrochlorothiazide (HYDRODIURIL) 12.5 MG tablet TAKE 1 TABLET(12.5 MG) BY MOUTH DAILY 90 tablet 0  . Insulin Detemir (LEVEMIR) 100 UNIT/ML Pen Inject 40 Units into the skin 2 (two) times daily. 5 pen 11  . Insulin Pen Needle (BD PEN NEEDLE NANO U/F) 32G X 4 MM MISC  1 each by Does not apply route daily. 100 each 5  . metFORMIN (GLUCOPHAGE) 500 MG tablet TAKE 2 TABLETS BY MOUTH TWICE DAILY BEFORE A MEAL 180 tablet 0  . NOVOTWIST 32G X 5 MM MISC USE WITH LEVEMIR PEN BID  5  . Olopatadine HCl 0.2 % SOLN Use 1 drop in each eye daily for allergic eye symptoms, watery, itchy eyes 2.5 mL 2  . omeprazole (PRILOSEC) 10 MG capsule TAKE 1 CAPSULE BY MOUTH EVERY DAY 30 capsule 5  . paliperidone (INVEGA) 6 MG 24 hr tablet Take 1 tablet (6 mg total) by mouth daily. 60 tablet 1  . pioglitazone (ACTOS) 45 MG tablet Take 1 tablet (45 mg total) by mouth daily. 30 tablet 12  . PROAIR HFA 108 (90 Base) MCG/ACT inhaler INHALE 2 PUFFS INTO THE LUNGS EVERY 6 HOURS AS NEEDED FOR WHEEZING OR SHORTNESS OF BREATH 8.5 g 6  . Vilazodone HCl (VIIBRYD) 40 MG TABS Take 1 tablet (40 mg total) by mouth every morning. 90 tablet 1  . paliperidone (INVEGA SUSTENNA) 234 MG/1.5ML SUSP injection Inject 234 mg into the muscle once. 1.5 mL 0   No current facility-administered medications for this visit.     Previous Psychotropic Medications:  Patient reported that she has previously tried Risperdal and Seroquel and Zyprexa Paxil and Celexa. She reported  that she has history of adverse reaction to Abilify when she had hives. She stated that she has never tried Depakote or lithium and Tegretol. She denied any history of suicide attempts in the past.  Substance Abuse History in the last 12 months:  No.  Consequences of Substance Abuse: Negative NA  Medical Decision Making:  Review of Psycho-Social Stressors (1) and Established Problem, Worsening (2)  Treatment Plan Summary: Medication management    Continue   Invega Sustenna 234 mg IM.   Continue  oral Invega  6 mg daily  Continue Depakote 250 mg in the morning and 500 at bedtime. She will continue on Viibryd 40 mg daily    Patient is taking melatonin to help her sleep at night.  Follow-up in 1 month     More than 50% of the time spent in psychoeducation, counseling and coordination of care.     This note was generated in part or whole with voice recognition software. Voice regonition is usually quite accurate but there are transcription errors that can and very often do occur. I apologize for any typographical errors that were not detected and corrected.    Brandy Hale, MD    9/24/201811:07 AM

## 2017-01-18 NOTE — Progress Notes (Signed)
BP 104/67 (BP Location: Left Arm, Patient Position: Sitting, Cuff Size: Large)   Pulse 85   Temp 98.2 F (36.8 C)   Ht  (1.6 m)   Wt 248 lb 12.8 oz (112.9 kg)   SpO2 98%   BMI 44.07 kg/m    Subjective:    Patient ID: Tabitha [name], female    DOB: November 29, 1970, 46 y.o.   MRN: [id number]  HPI: Tabitha [name] is a 46 y.o. female  Chief Complaint  Patient presents with  . Follow-up  . Diabetes  . Hypertension  . Knee Pain  . Toe Problem   Pt here today for DM f/u. Stopped invokana as it made her feel sleepy. BS have been in the 200s. Notes she has not been successful making any dietary changes since last visit. Does drink a lot of sweet tea and eat sweets frequently. Faithful with her current DM regimen, not having any hypoglycemic episodes.   Does not check BP at home. Doing well with HCTZ, has noted a difference in her LE edema. Denies dizziness, syncope, CP, SOB.   Right knee pain x 2-3 months with no known injury. Pain with weight bearing, steps, cracking at joint.   Toe discoloration and throbbing left middle toe. Some bruising discoloration of toe and extending down into carpals in that immediate area.Still has good ROM. States she may have stubbed the area a few days ago. Has not tried anything OTC. Does have significant neuropathy in her LEs.   Past Medical History:  Diagnosis Date  . Allergic rhinitis   . Anxiety   . Asthma   . Bipolar disorder (HCC)   . Depression   . Diabetes mellitus without complication (HCC)   . GERD (gastroesophageal reflux disease)   . Hyperlipidemia   . Mild sleep apnea   . OCD (obsessive compulsive disorder)   . Palpitations   . Paranoia (HCC)   . Schizoaffective disorder (HCC)   . Sleep apnea    Social History   Social History  . Marital status: Married    Spouse name: N/A  . Number of children: N/A  . Years of education: N/A   Occupational History  . Not on file.   Social History Main Topics  . Smoking status:  Never Smoker  . Smokeless tobacco: Never Used  . Alcohol use No  . Drug use: No  . Sexual activity: Yes    Partners: Male    Birth control/ protection: None, Surgical   Other Topics Concern  . Not on file   Social History Narrative  . No narrative on file   Relevant past medical, surgical, family and social history reviewed and updated as indicated. Interim medical history since our last visit reviewed. Allergies and medications reviewed and updated.  Review of Systems  Constitutional: Negative.   HENT: Negative.   Eyes: Negative.   Respiratory: Negative.   Cardiovascular: Negative.   Gastrointestinal: Negative.   Genitourinary: Negative.   Musculoskeletal: Positive for arthralgias.  Skin: Positive for color change.  Neurological: Positive for numbness.  Psychiatric/Behavioral: Negative.    Per HPI unless specifically indicated above     Objective:    BP 104/67 (BP Location: Left Arm, Patient Position: Sitting, Cuff Size: Large)   Pulse 85   Temp 98.2 F (36.8 C)   Ht  (1.6 m)   Wt 248 lb 12.8 oz (112.9 kg)   SpO2 98%   BMI 44.07 kg/m   Wt Readings from Last  3 Encounters:  01/18/17 248 lb 12.8 oz (112.9 kg)  01/13/17 247 lb (112 kg)  10/23/16 242 lb (109.8 kg)    Physical Exam  Constitutional: She is oriented to person, place, and time. She appears well-developed. No distress.  HENT:  Head: Atraumatic.  Eyes: Pupils are equal, round, and reactive to light. Conjunctivae are normal.  Neck: Normal range of motion. Neck supple.  Cardiovascular: Normal rate and normal heart sounds.   Musculoskeletal: Normal range of motion.  Crepitus of right knee with PROM Minimal joint line tenderness of left knee No edema, neg mcmurrays or joint laxity  Good ROM in left foot, no ttp over bruising  Neurological: She is alert and oriented to person, place, and time.  Skin: Skin is warm and dry.  Mild bruising at left foot 3rd digit  Psychiatric: She has a normal mood  and affect. Her behavior is normal.  Nursing note and vitals reviewed.   Diabetic Foot Exam - Simple   Simple Foot Form Diabetic Foot exam was performed with the following findings:  Yes 01/18/2017  4:14 AM  Visual Inspection No deformities, no ulcerations, no other skin breakdown bilaterally:  Yes See comments:  Yes Sensation Testing See comments:  Yes Pulse Check Posterior Tibialis and Dorsalis pulse intact bilaterally:  Yes Comments Left 3rd toe with some bruising discoloration extending down some into carpals Significant lack of sensation of right foot diffusely to monofilament sensation Mild decreased sensation of left foot     Results for orders placed or performed in visit on 01/18/17  Bayer DCA Hb A1c Waived  Result Value Ref Range   Bayer DCA Hb A1c Waived 7.3 (H) <7.0 %  Comprehensive metabolic panel  Result Value Ref Range   Glucose 227 (H) 65 - 99 mg/dL   BUN 11 6 - 24 mg/dL   Creatinine, Ser 1.61 0.57 - 1.00 mg/dL   GFR calc non Af Amer 97 >59 mL/min/1.73   GFR calc Af Amer 112 >59 mL/min/1.73   BUN/Creatinine Ratio 15 9 - 23   Sodium 136 134 - 144 mmol/L   Potassium 4.0 3.5 - 5.2 mmol/L   Chloride 97 96 - 106 mmol/L   CO2 22 20 - 29 mmol/L   Calcium 9.4 8.7 - 10.2 mg/dL   Total Protein 6.3 6.0 - 8.5 g/dL   Albumin 3.9 3.5 - 5.5 g/dL   Globulin, Total 2.4 1.5 - 4.5 g/dL   Albumin/Globulin Ratio 1.6 1.2 - 2.2   Bilirubin Total 0.2 0.0 - 1.2 mg/dL   Alkaline Phosphatase 68 39 - 117 IU/L   AST 16 0 - 40 IU/L   ALT 18 0 - 32 IU/L      Assessment & Plan:   Problem List Items Addressed This Visit      Cardiovascular and Mediastinum   HBP (high blood pressure)    Under good control, continue current regimen as well as lifestyle modifications. DASH diet reviewed      Relevant Orders   Comprehensive metabolic panel (Completed)     Endocrine   Diabetes mellitus type 2, controlled, without complications (HCC) - Primary    Pt wanting to try to work on  diet rather than adding medication on today. A1C 7.3, still not to goal. [facility] to [facility] and nutrition counseling. Continue current regimen. Will start gabapentin for her neuropathy and monitor closely for benefit. Risks and precautions reviewed.        Other Visit Diagnoses  Chronic pain of right knee       Suspect arthritic. Diclofenac gel, tylenol prn. Reviewed impact of weight loss and knee strain, pt will work on weight loss with low impact exercise and diet   Contusion of left foot, initial encounter       Will continue to monitor. Ice, elevation. F/u if sxs worsen or change. Will get x-ray if persistent       Follow up plan: Return in about 3 months (around 04/19/2017) for A1C, lipid, bmp.

## 2017-01-18 NOTE — Telephone Encounter (Signed)
Pt was seen today 01-18-17 by dr. faheem and nurse for injection.  

## 2017-01-19 LAB — COMPREHENSIVE METABOLIC PANEL
ALBUMIN: 3.9 g/dL (ref 3.5–5.5)
ALT: 18 IU/L (ref 0–32)
AST: 16 IU/L (ref 0–40)
Albumin/Globulin Ratio: 1.6 (ref 1.2–2.2)
Alkaline Phosphatase: 68 IU/L (ref 39–117)
BILIRUBIN TOTAL: 0.2 mg/dL (ref 0.0–1.2)
BUN / CREAT RATIO: 15 (ref 9–23)
BUN: 11 mg/dL (ref 6–24)
CHLORIDE: 97 mmol/L (ref 96–106)
CO2: 22 mmol/L (ref 20–29)
CREATININE: 0.75 mg/dL (ref 0.57–1.00)
Calcium: 9.4 mg/dL (ref 8.7–10.2)
GFR calc non Af Amer: 97 mL/min/{1.73_m2} (ref >59)
GFR, EST AFRICAN AMERICAN: 112 mL/min/{1.73_m2} (ref >59)
GLUCOSE: 227 mg/dL — AB (ref 65–99)
Globulin, Total: 2.4 g/dL (ref 1.5–4.5)
Potassium: 4 mmol/L (ref 3.5–5.2)
Sodium: 136 mmol/L (ref 134–144)
TOTAL PROTEIN: 6.3 g/dL (ref 6.0–8.5)

## 2017-01-21 ENCOUNTER — Telehealth: Payer: Self-pay | Admitting: Family Medicine

## 2017-01-21 NOTE — Assessment & Plan Note (Signed)
Pt wanting to try to work on diet rather than adding medication on today. A1C 7.3, still not to goal. Will refer to [facility] for support and nutrition counseling. Continue current regimen. Will start gabapentin for her neuropathy and monitor closely for benefit. Risks and precautions reviewed.

## 2017-01-21 NOTE — Assessment & Plan Note (Signed)
Under good control, continue current regimen as well as lifestyle modifications. DASH diet reviewed

## 2017-01-21 NOTE — Telephone Encounter (Signed)
[employer] needs pre-authorization for diabetes education course to see a nutritionist at [facility].   Please Advise.  Thank you

## 2017-01-21 NOTE — Patient Instructions (Signed)
Follow up in 3 months

## 2017-01-21 NOTE — Telephone Encounter (Signed)
Called patient back, I have never heard/handeled a referral/authorization for patient needing to see Nutritionist.  Patient has Medicare Complete with [employer]. She does not require a referral/precertification to see a provider. She is also seeing a nutritionist and not a doctor.  Explained to patient that [facility] were handling her referral today and should receive a phone call. If later on the patient needs a precert, they may handle it or if they need anything to call me. I have never done a preauthorizion for an education course.   Explained to patient to call me if needed.

## 2017-01-26 ENCOUNTER — Ambulatory Visit: Payer: Self-pay | Admitting: Dietician

## 2017-02-15 ENCOUNTER — Ambulatory Visit (INDEPENDENT_AMBULATORY_CARE_PROVIDER_SITE_OTHER): Payer: Medicare Other | Admitting: Psychiatry

## 2017-02-15 ENCOUNTER — Encounter: Payer: Self-pay | Admitting: Psychiatry

## 2017-02-15 ENCOUNTER — Ambulatory Visit (INDEPENDENT_AMBULATORY_CARE_PROVIDER_SITE_OTHER): Payer: Medicare Other

## 2017-02-15 VITALS — BP 124/76 | HR 96 | Temp 98.5°F | Wt 249.4 lb

## 2017-02-15 DIAGNOSIS — F429 Obsessive-compulsive disorder, unspecified: Secondary | ICD-10-CM

## 2017-02-15 DIAGNOSIS — F251 Schizoaffective disorder, depressive type: Secondary | ICD-10-CM

## 2017-02-15 DIAGNOSIS — F3170 Bipolar disorder, currently in remission, most recent episode unspecified: Secondary | ICD-10-CM

## 2017-02-15 DIAGNOSIS — F250 Schizoaffective disorder, bipolar type: Secondary | ICD-10-CM

## 2017-02-15 DIAGNOSIS — F259 Schizoaffective disorder, unspecified: Secondary | ICD-10-CM

## 2017-02-15 DIAGNOSIS — F3130 Bipolar disorder, current episode depressed, mild or moderate severity, unspecified: Secondary | ICD-10-CM

## 2017-02-15 MED ORDER — DIVALPROEX SODIUM 500 MG PO DR TAB: 500.0000 mg | DELAYED_RELEASE_TABLET | ORAL | 1 refills | Status: DC

## 2017-02-15 MED ORDER — PALIPERIDONE PALMITATE 234 MG/1.5ML IM SUSP: 234.0000 mg | Freq: Once | INTRAMUSCULAR | 0 refills | Status: DC

## 2017-02-15 MED ORDER — PALIPERIDONE PALMITATE 234 MG/1.5ML IM SUSP
234.0000 mg | Freq: Once | INTRAMUSCULAR | Status: AC
  Administered 2017-02-15: 234 mg via INTRAMUSCULAR

## 2017-02-15 MED ORDER — PALIPERIDONE ER 6 MG PO TB24: 6.0000 mg | ORAL_TABLET | Freq: Every day | ORAL | 1 refills | Status: DC

## 2017-02-15 MED ORDER — VILAZODONE HCL 40 MG PO TABS: 40.0000 mg | ORAL_TABLET | Freq: Every morning | ORAL | 1 refills | Status: DC

## 2017-02-15 NOTE — Progress Notes (Signed)
Psychiatric Follow up MD/NP Note   Patient Identification: Tabitha [name] MRN:  [id number] Date of Evaluation:  02/15/2017 Referral Source: [facility].  Chief Complaint:   Chief Complaint    Follow-up; Medication Refill     Visit Diagnosis:    ICD-10-CM   1. Schizoaffective disorder, bipolar type (HCC) F25.0   2. Obsessive-compulsive disorder, unspecified type F42.9    Diagnosis:   Patient Active Problem List   Diagnosis Date Noted  . GERD (gastroesophageal reflux disease) [K21.9]   . Palpitations [R00.2]   . Bipolar disorder (HCC) [F31.9]   . Schizoaffective disorder (HCC) [F25.9]   . Intermittent palpitations [R00.2] 09/07/2016  . Frequent headaches [R51] 09/07/2016  . 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 Asthma [J45.909] 06/15/2016  . Acne rosacea [L71.9] 06/15/2016  . Allergic conjunctivitis of both eyes [H10.13] 05/08/2016  . Obesity [E66.9] 09/24/2015  . Diabetes mellitus type 2, controlled, without complications (HCC) [E11.9] 12/03/2014  . HBP (high blood pressure) [I10] 12/03/2014  . Hyperlipidemia [E78.5] 12/03/2014  . Bipolar disorder in partial remission (HCC) [F31.70] 12/03/2014  . GERD without esophagitis [K21.9] 12/03/2014   History of Present Illness:    Patient is a 46 year-old married female who presented for follow-up.Patient reported that she has started noticing Improvement in her symptoms.  She Reported that she has chronic paranoia especially in her church when she feels that somebody is following her. She reported that that she feels that the cameras are installed in the church but when she comes home the paranoia does not stay. Her husband also thinks that she is improving. Patient is noncompliant with her diabetes and her blood sugar remains elevated. She is still eating unhealthy food and is eating sweets. I discussed with her at length about controlling her diabetes as well as start exercising and walking for at least an  hour. She reported that she has a place where she can walk with her husband. I also advised her to control her neuropathy in blood sugar. She was very thankful and she reported that she will start controlling her diabetes. She is planning to go to [place] to meet her family members around Thanksgiving. She stated that the [facility]chwood has been helping her and she is going to continue with her injections as prescribed. She appeared calm and alert during the interview.    Patient reported that her husband is supportive. She is compliant with her medications.   Patient will receive [facility]ch[facility] today.    Past Medical History:  Past Medical History:  Diagnosis Date  . Allergic rhinitis   . Anxiety   . Asthma   . Bipolar disorder (HCC)   . Depression   . Diabetes mellitus without complication (HCC)   . GERD (gastroesophageal reflux disease)   . Hyperlipidemia   . Mild sleep apnea   . OCD (obsessive compulsive disorder)   . Palpitations   . Paranoia (HCC)   . Schizoaffective disorder (HCC)   . Sleep apnea     Past Surgical History:  Procedure Laterality Date  . CESAREAN SECTION  2006/2008  . CHOLECYSTECTOMY  2006   Family History:  Family History  Problem Relation Age of Onset  . Diabetes Father   . Hyperlipidemia Father   . Diabetes Brother   . Lupus Mother   . Diabetes Mother        pre diabetes   Social History:   Social History   Social History  . Marital status: Married  Spouse name: N/A  . Number of children: N/A  . Years of education: N/A   Social History Main Topics  . Smoking status: Never Smoker  . Smokeless tobacco: Never Used  . Alcohol use No  . Drug use: No  . Sexual activity: Yes    Partners: Male    Birth control/ protection: None, Surgical   Other Topics Concern  . None   Social History Narrative  . None   Additional Social History:  Married x 11 years. Has a good relationship with husband and has a 6810 and 957 yo. 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 She stated that she does not smoke cigarettes and does not use any drugs or alcohol   Musculoskeletal: Strength & Muscle Tone: within normal limits Gait & Station: normal Patient leans: N/A  Psychiatric Specialty Exam: Medication Refill  Pertinent negatives include no congestion or coughing.  Anxiety       Review of Systems  Constitutional: Positive for malaise/fatigue. Negative for weight loss.  HENT: Negative for congestion and hearing loss.   Eyes: Negative for photophobia.  Respiratory: Negative for cough and sputum production.   Genitourinary: Negative for urgency.  Psychiatric/Behavioral: Positive for depression and hallucinations.  All other systems reviewed and are negative.   Blood pressure 124/76, pulse 96, temperature 98.5 F (36.9 C), temperature source Oral, weight 249 lb 6.4 oz (113.1 kg).Body mass index is 44.18 kg/m.  General Appearance: Casual and Fairly Groomed  Eye Contact:  Fair  Speech:  Clear and Coherent  Volume:  Normal  Mood:  Anxious and Depressed  Affect:  Congruent  Thought Process:  Coherent  Orientation:  Full (Time, Place, and Person)  Thought Content:  Obsessions and Paranoid Ideation  Suicidal Thoughts:  No  Homicidal Thoughts:  No  Memory:  Immediate;   Fair  Judgement:  Fair  Insight:  Fair  Psychomotor Activity:  Normal  Concentration:  Fair  Recall:  [employer]  Fund of Knowledge:Fair  Language: Fair  Akathisia:  No  Handed:  Right  AIMS (if indicated):  none  Assets:  Communication Skills Desire for Improvement Housing Intimacy Physical Health Social Support  ADL's:  Intact  Cognition: WNL  Sleep:  6-7    Is the patient at risk to self?  No. Has the patient been a risk to self in the past 6 months?  No. Has the patient been a risk to self within the distant past?  No. Is the patient a risk to others?  No. Has the patient been a risk to others in the past 6 months?  No. Has  the patient been a risk to others within the distant past?  No.  Allergies:   Allergies  Allergen Reactions  . Abilify [Aripiprazole] Hives   Current Medications: Current Outpatient Prescriptions  Medication Sig Dispense Refill  . atorvastatin (LIPITOR) 20 MG tablet TAKE 1 TABLET BY MOUTH EVERY NIGHT AT BEDTIME 30 tablet 12  . diclofenac sodium (VOLTAREN) 1 % GEL Apply 2 g topically 2 (two) times daily as needed. 100 g 0  . divalproex (DEPAKOTE) 250 MG DR tablet Take 1 tablet (250 mg total) by mouth every morning. 90 tablet 1  . divalproex (DEPAKOTE) 500 MG DR tablet Take 1 tablet (500 mg total) by mouth 1 day or 1 dose. 90 tablet 1  . doxycycline (VIBRAMYCIN) 50 MG capsule Take 1 capsule (50 mg total) by mouth every morning. 30 capsule 12  . fluticasone (FLOVENT  HFA) 110 MCG/ACT inhaler Inhale 1 puff into the lungs 2 (two) times daily. 1 Inhaler 12  . gabapentin (NEURONTIN) 300 MG capsule Take 1 capsule (300 mg total) by mouth 3 (three) times daily. 90 capsule 3  . glucose blood (ACCU-CHEK AVIVA PLUS) test strip 1 each by Other route as needed for other. Use as instructed 100 each 12  . hydrochlorothiazide (HYDRODIURIL) 12.5 MG tablet TAKE 1 TABLET(12.5 MG) BY MOUTH DAILY 90 tablet 0  . Insulin Detemir (LEVEMIR) 100 UNIT/ML Pen Inject 40 Units into the skin 2 (two) times daily. 5 pen 11  . Insulin Pen Needle (BD PEN NEEDLE NANO U/F) 32G X 4 MM MISC 1 each by Does not apply route daily. 100 each 5  . metFORMIN (GLUCOPHAGE) 500 MG tablet TAKE 2 TABLETS BY MOUTH TWICE DAILY BEFORE A MEAL 180 tablet 0  . NOVOTWIST 32G X 5 MM MISC USE WITH LEVEMIR PEN BID  5  . omeprazole (PRILOSEC) 10 MG capsule TAKE 1 CAPSULE BY MOUTH EVERY DAY 30 capsule 5  . paliperidone (INVEGA SUSTENNA) 234 MG/1.5ML SUSP injection Inject 234 mg into the muscle once. 1.5 mL 0  . paliperidone (INVEGA) 6 MG 24 hr tablet Take 1 tablet (6 mg total) by mouth daily. 60 tablet 1  . pioglitazone (ACTOS) 45 MG tablet Take 1 tablet  (45 mg total) by mouth daily. 30 tablet 12  . PROAIR HFA 108 (90 Base) MCG/ACT inhaler INHALE 2 PUFFS INTO THE LUNGS EVERY 6 HOURS AS NEEDED FOR WHEEZING OR SHORTNESS OF BREATH 8.5 g 6  . Vilazodone HCl (VIIBRYD) 40 MG TABS Take 1 tablet (40 mg total) by mouth every morning. 90 tablet 1   Current Facility-Administered Medications  Medication Dose Route Frequency Provider Last Rate Last Dose  . paliperidone (INVEGA SUSTENNA) injection 234 mg  234 mg Intramuscular Once Brandy Hale, MD        Previous Psychotropic Medications:  Patient reported that she has previously tried Risperdal and Seroquel and Zyprexa Paxil and Celexa. She reported that she has history of adverse reaction to Abilify when she had hives. She stated that she has never tried Depakote or lithium and Tegretol. She denied any history of suicide attempts in the past.  Substance Abuse History in the last 12 months:  No.  Consequences of Substance Abuse: Negative NA  Medical Decision Making:  Review of Psycho-Social Stressors (1) and Established Problem, Worsening (2)  Treatment Plan Summary: Medication management    Continue   Invega Sustenna 234 mg IM.   Continue  oral Invega  6 mg daily  Continue Depakote 250 mg in the morning and 500 at bedtime. She will continue on Viibryd 40 mg daily    Patient is taking melatonin to help her sleep at night.  Follow-up in 1 month     More than 50% of the time spent in psychoeducation, counseling and coordination of care.     This note was generated in part or whole with voice recognition software. Voice regonition is usually quite accurate but there are transcription errors that can and very often do occur. I apologize for any typographical errors that were not detected and corrected.    Brandy Hale, MD    10/22/201812:05 PM

## 2017-02-22 ENCOUNTER — Other Ambulatory Visit: Payer: Self-pay | Admitting: Family Medicine

## 2017-02-22 DIAGNOSIS — E119 Type 2 diabetes mellitus without complications: Secondary | ICD-10-CM

## 2017-02-22 NOTE — Telephone Encounter (Signed)
Pt had OV 01/18/17 that said pt did not want med at this time. Surescripts wanting to do refill.

## 2017-03-22 ENCOUNTER — Ambulatory Visit: Payer: Medicare Other | Admitting: Family Medicine

## 2017-03-22 ENCOUNTER — Encounter: Payer: Self-pay | Admitting: Family Medicine

## 2017-03-22 ENCOUNTER — Ambulatory Visit: Payer: Medicare Other | Admitting: Psychiatry

## 2017-03-22 ENCOUNTER — Ambulatory Visit: Payer: Medicare Other

## 2017-03-22 VITALS — BP 123/70 | HR 98 | Temp 98.6°F | Wt 244.7 lb

## 2017-03-22 DIAGNOSIS — J4520 Mild intermittent asthma, uncomplicated: Secondary | ICD-10-CM

## 2017-03-22 MED ORDER — AMOXICILLIN-POT CLAVULANATE 875-125 MG PO TABS: 1.0000 | ORAL_TABLET | Freq: Two times a day (BID) | ORAL | 0 refills | Status: DC

## 2017-03-22 MED ORDER — FLUTICASONE PROPIONATE HFA 110 MCG/ACT IN AERO: 1.0000 | INHALATION_SPRAY | Freq: Two times a day (BID) | RESPIRATORY_TRACT | 11 refills | Status: DC

## 2017-03-22 MED ORDER — PREDNISONE 10 MG PO TABS: ORAL_TABLET | ORAL | 0 refills | Status: DC

## 2017-03-22 MED ORDER — ALBUTEROL SULFATE HFA 108 (90 BASE) MCG/ACT IN AERS: 2.0000 | INHALATION_SPRAY | Freq: Four times a day (QID) | RESPIRATORY_TRACT | 6 refills | Status: DC | PRN

## 2017-03-22 MED ORDER — HYDROCOD POLST-CPM POLST ER 10-8 MG/5ML PO SUER: 5.0000 mL | Freq: Two times a day (BID) | ORAL | 0 refills | Status: DC | PRN

## 2017-03-22 MED ORDER — BENZONATATE 200 MG PO CAPS: 200.0000 mg | ORAL_CAPSULE | Freq: Two times a day (BID) | ORAL | 0 refills | Status: DC | PRN

## 2017-03-22 NOTE — Patient Instructions (Signed)
Follow up as needed

## 2017-03-22 NOTE — Progress Notes (Signed)
   BP 123/70 (BP Location: Right Arm, Patient Position: Sitting, Cuff Size: Large)   Pulse 98   Temp 98.6 F (37 C) (Oral)   Wt 244 lb 11.2 oz (111 kg)   SpO2 97%   BMI 43.35 kg/m    Subjective:    Patient ID: Tabitha [name], female    DOB: December 19, 1970, 46 y.o.   MRN: [id number]  HPI: Tabitha HouseholderKathleen J Hietala is a 46 y.o. female  Chief Complaint  Patient presents with  . Cough    x's 12 days. Mostly dry cough.  . Sinusitis  . Nasal Congestion   Congestion and dry, hacking cough x almost 2 weeks. Also having some wheezing and chills, but no fevers, body aches, CP. Trying dayquil and nyquil, cough drops, and robitussin with minimal relief. Using her flovent and albuterol regularly for her asthma with minimal relief at this time. Husband also sick with similar sxs.   Relevant past medical, surgical, family and social history reviewed and updated as indicated. Interim medical history since our last visit reviewed. Allergies and medications reviewed and updated.  Review of Systems  Constitutional: Positive for fatigue.  HENT: Positive for congestion.   Respiratory: Positive for cough, chest tightness and wheezing.   Cardiovascular: Negative.   Gastrointestinal: Negative.   Genitourinary: Negative.   Musculoskeletal: Negative.   Neurological: Negative.   Psychiatric/Behavioral: Negative.    Per HPI unless specifically indicated above     Objective:    BP 123/70 (BP Location: Right Arm, Patient Position: Sitting, Cuff Size: Large)   Pulse 98   Temp 98.6 F (37 C) (Oral)   Wt 244 lb 11.2 oz (111 kg)   SpO2 97%   BMI 43.35 kg/m   Wt Readings from Last 3 Encounters:  03/22/17 244 lb 11.2 oz (111 kg)  01/18/17 248 lb 12.8 oz (112.9 kg)  01/13/17 247 lb (112 kg)    Physical Exam  Constitutional: She is oriented to person, place, and time. She appears well-developed and well-nourished. No distress.  HENT:  Head: Atraumatic.  Right Ear: External ear normal.  Left Ear:  External ear normal.  Oropharynx injected Nasal mucosa erythematous with drainage present   Eyes: Conjunctivae are normal. Pupils are equal, round, and reactive to light.  Neck: Normal range of motion. Neck supple.  Cardiovascular: Normal rate and normal heart sounds.  Pulmonary/Chest: Effort normal. No respiratory distress. She has wheezes (worse on right).  Musculoskeletal: Normal range of motion.  Lymphadenopathy:    She has cervical adenopathy.  Neurological: She is alert and oriented to person, place, and time.  Skin: Skin is warm and dry.  Psychiatric: She has a normal mood and affect. Her behavior is normal.  Nursing note and vitals reviewed.     Assessment & Plan:   Problem List Items Addressed This Visit      Respiratory   Asthma - Primary    With acute exacerbation from URI. Will treat with zpack, prednisone taper, tessalon, and tussionex. Refilled flovent and albuterol, continue regular use. Precautions reviewed with the tussionex, particularly with regard to sedation risks. F/u if worsening or no improvement      Relevant Medications   albuterol (PROAIR HFA) 108 (90 Base) MCG/ACT inhaler   fluticasone (FLOVENT HFA) 110 MCG/ACT inhaler   predniSONE (DELTASONE) 10 MG tablet       Follow up plan: Return for as scheduled.

## 2017-03-22 NOTE — Assessment & Plan Note (Signed)
With acute exacerbation from URI. Will treat with zpack, prednisone taper, tessalon, and tussionex. Refilled flovent and albuterol, continue regular use. Precautions reviewed with the tussionex, particularly with regard to sedation risks. F/u if worsening or no improvement

## 2017-03-25 ENCOUNTER — Telehealth: Payer: Self-pay | Admitting: Family Medicine

## 2017-03-25 ENCOUNTER — Other Ambulatory Visit: Payer: Self-pay | Admitting: Family Medicine

## 2017-03-25 MED ORDER — OMEPRAZOLE 10 MG PO CPDR: DELAYED_RELEASE_CAPSULE | ORAL | 5 refills | Status: DC

## 2017-03-25 NOTE — Telephone Encounter (Signed)
Rx sent 

## 2017-03-25 NOTE — Telephone Encounter (Signed)
Copied from CRM [id number]. Topic: Quick Communication - Rx Refill/Question >> Mar 25, 2017 12:27 PM Viviann SpareWhite, Selina wrote: Patient is requesting a refill on her omperazole (PRILOSEC) 10 mg  Has the patient contacted their pharmacy? yes  (Agent: If no, request that the patient contact the pharmacy for the refill.) Preferred Pharmacy (with phone number or street name):  [facility] [phone] [place] [address] AT [facility] [address] [facility] [address] [place] [id number] Phone: [phone] Fax: [phone]   Agent: Please be advised that RX refills may take up to 48 hours. We ask that you follow-up with your pharmacy.

## 2017-04-13 ENCOUNTER — Encounter: Payer: Self-pay | Admitting: Psychiatry

## 2017-04-13 ENCOUNTER — Other Ambulatory Visit: Payer: Self-pay

## 2017-04-13 ENCOUNTER — Ambulatory Visit: Payer: Medicare Other | Admitting: Psychiatry

## 2017-04-13 ENCOUNTER — Ambulatory Visit (INDEPENDENT_AMBULATORY_CARE_PROVIDER_SITE_OTHER): Payer: Medicare Other

## 2017-04-13 VITALS — BP 157/86 | HR 103 | Temp 98.1°F | Wt 248.0 lb

## 2017-04-13 DIAGNOSIS — F259 Schizoaffective disorder, unspecified: Secondary | ICD-10-CM

## 2017-04-13 DIAGNOSIS — F250 Schizoaffective disorder, bipolar type: Secondary | ICD-10-CM

## 2017-04-13 DIAGNOSIS — F251 Schizoaffective disorder, depressive type: Secondary | ICD-10-CM

## 2017-04-13 DIAGNOSIS — F429 Obsessive-compulsive disorder, unspecified: Secondary | ICD-10-CM

## 2017-04-13 DIAGNOSIS — F3170 Bipolar disorder, currently in remission, most recent episode unspecified: Secondary | ICD-10-CM

## 2017-04-13 DIAGNOSIS — F3130 Bipolar disorder, current episode depressed, mild or moderate severity, unspecified: Secondary | ICD-10-CM

## 2017-04-13 MED ORDER — DIVALPROEX SODIUM 500 MG PO DR TAB: 500.0000 mg | DELAYED_RELEASE_TABLET | Freq: Every evening | ORAL | 0 refills | Status: DC

## 2017-04-13 MED ORDER — PALIPERIDONE PALMITATE 156 MG/ML IM SUSP: 156.0000 mg | INTRAMUSCULAR | 2 refills | Status: DC

## 2017-04-13 MED ORDER — PALIPERIDONE PALMITATE 234 MG/1.5ML IM SUSP
234.0000 mg | Freq: Once | INTRAMUSCULAR | Status: AC
Start: 2017-04-13 — End: 2017-04-13
  Administered 2017-04-13: 234 mg via INTRAMUSCULAR

## 2017-04-13 MED ORDER — PERPHENAZINE 2 MG PO TABS: 2.0000 mg | ORAL_TABLET | Freq: Two times a day (BID) | ORAL | 1 refills | Status: DC

## 2017-04-13 MED ORDER — PALIPERIDONE PALMITATE 234 MG/1.5ML IM SUSP: 234.0000 mg | Freq: Once | INTRAMUSCULAR | 0 refills | Status: DC

## 2017-04-13 MED ORDER — DIVALPROEX SODIUM 250 MG PO DR TAB: 250.0000 mg | DELAYED_RELEASE_TABLET | Freq: Every morning | ORAL | 0 refills | Status: DC

## 2017-04-13 NOTE — Patient Instructions (Signed)
Perphenazine tablets What is this medicine? PERPHENAZINE (per FEN a zeen) is used to treat schizophrenia. It also is used to treat severe nausea and vomiting in adults. This medicine may be used for other purposes; ask your health care provider or pharmacist if you have questions. COMMON BRAND NAME(S): Trilafon What should I tell my health care provider before I take this medicine? They need to know if you have any of these conditions: -blood disorders or disease -dementia -liver disease or jaundice -Parkinson's disease -uncontrollable movement disorder -an unusual or allergic reaction to perphenazine, other medicines, foods, dyes, or preservatives -pregnant or trying to get pregnant -breast-feeding How should I use this medicine? Take this medicine by mouth with a glass of water. Follow the directions on the prescription label. Take your doses at regular intervals. Do not take your medicine more often than directed. Talk to your pediatrician regarding the use of this medicine in children. While this medicine may be prescribed for children as young as 12 years of age for selected conditions, precautions do apply. Overdosage: If you think you have taken too much of this medicine contact a poison control center or emergency room at once. NOTE: This medicine is only for you. Do not share this medicine with others. What if I miss a dose? If you miss a dose, take it as soon as you can. If it is almost time for your next dose, take only that dose. Do not take double or extra doses. What may interact with this medicine? Do not take this medicine with any of the following medications: -amoxapine -antidepressants like citalopram, escitalopram, fluoxetine, paroxetine, and sertraline -certain antibiotics like gatifloxacin, grepafloxacin, sparfloxacin -cisapride -clozapine -dofetilide -droperidol -ibutilide -levomethadyl -maprotiline -phenothiazines like chlorpromazine, mesoridazine,  prochlorperazine, thioridazine -phenylpropanolamine -pimozide -pindolol -propranolol -sotalol -tricyclic antidepressants like amitriptyline, clomipramine, imipramine, nortriptyline and others -trimethobenzamide -ziprasidone This medicine may also interact with the following medications: -antacids -medicines for colds and flu -medicines for hay fever and other allergies -prescription pain medicines This list may not describe all possible interactions. Give your health care provider a list of all the medicines, herbs, non-prescription drugs, or dietary supplements you use. Also tell them if you smoke, drink alcohol, or use illegal drugs. Some items may interact with your medicine. What should I watch for while using this medicine? Visit your doctor or health care professional for regular checks on your progress. You may get drowsy or dizzy. Do not drive, use machinery, or do anything that needs mental alertness until you know how this medicine affects you. Do not stand or sit up quickly, especially if you are an older patient. This reduces the risk of dizzy or fainting spells. Alcohol may interfere with the effect of this medicine. Avoid alcoholic drinks. This medicine can reduce the response of your body to heat or cold. Dress warm in cold weather and stay hydrated in hot weather. If possible, avoid extreme temperatures like saunas, hot tubs, very hot or cold showers, or activities that can cause dehydration such as vigorous exercise. This medicine can make you more sensitive to the sun. Keep out of the sun. If you cannot avoid being in the sun, wear protective clothing and use sunscreen. Do not use sun lamps or tanning beds/booths. Your mouth may get dry. Chewing sugarless gum or sucking hard candy, and drinking plenty of water may help. Contact your doctor if the problem does not go away or is severe. What side effects may I notice from receiving this medicine? Side effects that   you should  report to your doctor or health care professional as soon as possible: -allergic reactions like skin rash, itching or hives, swelling of the face, lips, or tongue -blurred vision -breast enlargement in men or women -breast milk in women who are not breast-feeding -chest pain, fast or irregular heartbeat -confusion, restlessness -dark yellow or brown urine -difficulty breathing or swallowing -dizziness or fainting spells -drooling, shaking -fever, chills, sore throat -involuntary or uncontrollable movements of the eyes, mouth, head, arms, and legs -seizures -stomach area pain -unusual bleeding or bruising -unusually weak or tired -yellowing of skin or eyes Side effects that usually do not require medical attention (report to your doctor or health care professional if they continue or are bothersome): -difficulty passing urine -difficulty sleeping -headache -sexual dysfunction -skin rash, or itching This list may not describe all possible side effects. Call your doctor for medical advice about side effects. You may report side effects to FDA at 1-800-FDA-1088. Where should I keep my medicine? Keep out of the reach of children. Store at room temperature between 20 and 25 degrees C (68 and 77 degrees F). Protect from light. Throw away any unused medicine after the expiration date. NOTE: This sheet is a summary. It may not cover all possible information. If you have questions about this medicine, talk to your doctor, pharmacist, or health care provider.  2018 Elsevier/Gold Standard (2011-09-01 16:29:58)  

## 2017-04-13 NOTE — Progress Notes (Signed)
BH MD OP Progress Note  04/13/2017 3:27 PM Tabitha HouseholderKathleen J [name]  MRN:  [id number]  Chief Complaint: ' I am still paranoid.'  Chief Complaint    Follow-up; Medication Refill     HPI: Tabitha [name] is a 46 year old married Caucasian female who is on SSD who [place] in [place], presented today for a follow-up visit.  Tabitha [name] has a history of schizoaffective disorder as well as some unspecified obsessive-compulsive symptoms.  She used to follow up with Dr. Garnetta BuddyFaheem here in clinic.  I have reviewed notes in the EHR per Dr. Garnetta BuddyFaheem.  Tabitha [name] reports that she continues to struggle with some depressive symptoms.  She reports that she feels down, low mood on and off.  She also reports some paranoia and she reports that it becomes severe on sundays .  She reports that she feels like there are cameras installed in the church.  Tabitha [name] reports that it is very anxiety provoking when she feels that way.  Also reports obsessive thoughts as well as delusions about salvation.  She reports that she spends a lot of time ruminating about that and obsessing about that and also has some chanting and praying.  Unknown if these are true obsessive-compulsive disorder symptoms or her delusions.  Tabitha [name] reports she is compliant on her sleep medication.  She reports she takes melatonin , a high dose , and that keeps her asleep.  She also is on CPAP for her obstructive sleep apnea.  She reports she is compliant with the same.  She is currently on [place] Sustenna 234 mg IM .  Also takes 6 mg oral.  She has been on it since the past several months. Discussed with patient that she does not have to continue the oral medication since she has been on the IM since the past several months.  Also discussed with her that in the place of the oral Invega  another antipsychotic can be added to help her with her anxiety provoking paranoia.  Denies any substance abuse problems.  Her Blood pressure is elevated today.  She will follow-up with her  PMD.    Visit Diagnosis:    ICD-10-CM   1. Schizoaffective disorder, depressive type (HCC) F25.1 divalproex (DEPAKOTE) 250 MG DR tablet    divalproex (DEPAKOTE) 500 MG DR tablet    paliperidone (INVEGA SUSTENNA) 234 MG/1.5ML SUSP injection    perphenazine (TRILAFON) 2 MG tablet    paliperidone (INVEGA SUSTENNA) 156 MG/ML SUSP injection  2. Obsessive-compulsive disorder, unspecified type F42.9     Past Psychiatric History: Reports history of schizoaffective disorder, OCD.  She used to follow up with [facility] [place] in the past and then with Dr. Garnetta BuddyFaheem.  She denies any suicide attempts.  Denies any inpatient mental health admissions  Past Medical History:  Past Medical History:  Diagnosis Date  . Allergic rhinitis   . Anxiety   . Asthma   . Bipolar disorder (HCC)   . Depression   . Diabetes mellitus without complication (HCC)   . GERD (gastroesophageal reflux disease)   . Hyperlipidemia   . Mild sleep apnea   . OCD (obsessive compulsive disorder)   . Palpitations   . Paranoia (HCC)   . Schizoaffective disorder (HCC)   . Sleep apnea     Past Surgical History:  Procedure Laterality Date  . CESAREAN SECTION  2006/2008  . CHOLECYSTECTOMY  2006    Family Psychiatric History: Denies  Family History:  Family History  Problem Relation Age of Onset  .  Diabetes Father   . Hyperlipidemia Father   . Diabetes Brother   . Lupus Mother   . Diabetes Mother        pre diabetes    Social History: She is married, lives with her husband. She is on SSD. Her husband is also disabled.  He is a double amputee, from a past hx of abdominal gangrene which spread .  She has 2 children aged 72 and 37 years old.   Social History   Socioeconomic History  . Marital status: Married    Spouse name: None  . Number of children: None  . Years of education: None  . Highest education level: None  Social Needs  . Financial resource strain: None  . Food insecurity - worry: None  . Food  insecurity - inability: None  . Transportation needs - medical: None  . Transportation needs - non-medical: None  Occupational History  . None  Tobacco Use  . Smoking status: Never Smoker  . Smokeless tobacco: Never Used  Substance and Sexual Activity  . Alcohol use: No    Alcohol/week: 0.0 oz  . Drug use: No  . Sexual activity: Yes    Partners: Male    Birth control/protection: None, Surgical  Other Topics Concern  . None  Social History Narrative  . None    Allergies:  Allergies  Allergen Reactions  . Abilify [Aripiprazole] Hives    Metabolic Disorder Labs: Lab Results  Component Value Date   HGBA1C 6.6 (H) 06/18/2016   Lab Results  Component Value Date   PROLACTIN 36.2 (H) 03/26/2016   PROLACTIN 37.1 (H) 09/26/2015   Lab Results  Component Value Date   CHOL 144 10/23/2016   TRIG 352 (H) 10/23/2016   HDL 48 03/26/2016   CHOLHDL 2.9 09/26/2015   VLDL 70 (H) 10/23/2016   LDLCALC 63 03/26/2016   LDLCALC 31 09/26/2015   Lab Results  Component Value Date   TSH 2.500 03/26/2016   TSH 2.28 09/26/2015    Therapeutic Level Labs: No results found for: LITHIUM No results found for: VALPROATE No components found for:  CBMZ  Current Medications: Current Outpatient Medications  Medication Sig Dispense Refill  . albuterol (PROAIR HFA) 108 (90 Base) MCG/ACT inhaler Inhale 2 puffs into the lungs every 6 (six) hours as needed for wheezing or shortness of breath. 8.5 g 6  . atorvastatin (LIPITOR) 20 MG tablet TAKE 1 TABLET BY MOUTH EVERY NIGHT AT BEDTIME 30 tablet 12  . diclofenac sodium (VOLTAREN) 1 % GEL Apply 2 g topically 2 (two) times daily as needed. 100 g 0  . divalproex (DEPAKOTE) 250 MG DR tablet Take 1 tablet (250 mg total) by mouth every morning. 90 tablet 0  . divalproex (DEPAKOTE) 500 MG DR tablet Take 1 tablet (500 mg total) by mouth every evening. 90 tablet 0  . doxycycline (VIBRAMYCIN) 50 MG capsule Take 1 capsule (50 mg total) by mouth every morning.  30 capsule 12  . fluticasone (FLOVENT HFA) 110 MCG/ACT inhaler Inhale 1 puff into the lungs 2 (two) times daily. 1 Inhaler 11  . gabapentin (NEURONTIN) 300 MG capsule Take 1 capsule (300 mg total) by mouth 3 (three) times daily. 90 capsule 3  . glucose blood (ACCU-CHEK AVIVA PLUS) test strip 1 each by Other route as needed for other. Use as instructed 100 each 12  . hydrochlorothiazide (HYDRODIURIL) 12.5 MG tablet TAKE 1 TABLET(12.5 MG) BY MOUTH DAILY 90 tablet 0  . Insulin Detemir (LEVEMIR) 100 UNIT/ML  Pen Inject 40 Units into the skin 2 (two) times daily. 5 pen 11  . Insulin Pen Needle (BD PEN NEEDLE NANO U/F) 32G X 4 MM MISC 1 each by Does not apply route daily. 100 each 5  . metFORMIN (GLUCOPHAGE) 500 MG tablet TAKE 2 TABLETS BY MOUTH TWICE DAILY BEFORE A MEAL 180 tablet 0  . NOVOTWIST 32G X 5 MM MISC USE WITH LEVEMIR PEN BID  5  . omeprazole (PRILOSEC) 10 MG capsule TAKE 1 CAPSULE BY MOUTH EVERY DAY 30 capsule 5  . pioglitazone (ACTOS) 45 MG tablet Take 1 tablet (45 mg total) by mouth daily. 30 tablet 12  . Vilazodone HCl (VIIBRYD) 40 MG TABS Take 1 tablet (40 mg total) by mouth every morning. 90 tablet 1  . paliperidone (INVEGA SUSTENNA) 156 MG/ML SUSP injection Inject 1 mL (156 mg total) into the muscle every 28 (twenty-eight) days. Next dose in 28 days -05/12/2017 1 mL 2  . paliperidone (INVEGA SUSTENNA) 234 MG/1.5ML SUSP injection Inject 234 mg into the muscle once. 1.5 mL 0  . paliperidone (INVEGA SUSTENNA) 234 MG/1.5ML SUSP injection Inject 234 mg into the muscle once for 1 dose. 1.5 mL 0  . perphenazine (TRILAFON) 2 MG tablet Take 1 tablet (2 mg total) by mouth 2 (two) times daily. 60 tablet 1   Current Facility-Administered Medications  Medication Dose Route Frequency Provider Last Rate Last Dose  . paliperidone (INVEGA SUSTENNA) injection 234 mg  234 mg Intramuscular Once Jomarie LongsEappen, Garrell Flagg, MD         Musculoskeletal: Strength & Muscle Tone: within normal limits Gait & Station:  normal Patient leans: N/A  Psychiatric Specialty Exam: Review of Systems  Psychiatric/Behavioral: Positive for hallucinations (paranoia). The patient is nervous/anxious.   All other systems reviewed and are negative.   Blood pressure (!) 157/86, pulse (!) 103, temperature 98.1 F (36.7 C), temperature source Oral, weight 248 lb (112.5 kg).Body mass index is 43.93 kg/m.  General Appearance: Casual  Eye Contact:  Fair  Speech:  Clear and Coherent  Volume:  Normal  Mood:  Anxious  Affect:  Congruent  Thought Process:  Goal Directed and Descriptions of Associations: Intact  Orientation:  Full (Time, Place, and Person)  Thought Content: Paranoid Ideation   Suicidal Thoughts:  No  Homicidal Thoughts:  No  Memory:  Immediate;   Fair Recent;   Fair Remote;   Fair  Judgement:  Fair  Insight:  Fair  Psychomotor Activity:  Normal  Concentration:  Concentration: Fair and Attention Span: Fair  Recall:  [employer]Fair  Fund of Knowledge: Fair  Language: Fair  Akathisia:  No  Handed:  Right  AIMS (if indicated): 0  Assets:  Communication Skills Desire for Improvement Financial Resources/Insurance Housing Resilience Social Support  ADL's:  Intact  Cognition: WNL  Sleep:  Fair   Screenings: AIMS     Office Visit from 07/29/2016 in [facility] Office Visit from [facility]/06/2016 in [facility] Office Visit from 04/01/2016 in [facility] Office Visit from 03/28/2015 in [facility]  AIMS Total Score  0  0  0  0    PHQ2-9     Clinical Support from 01/13/2017 in [facility] Office Visit from 09/24/2015 in [facility] Office Visit from 06/20/2015 in [facility] Office Visit from 03/15/2015 in [facility] Office Visit from 01/08/2015 in [facility]  PHQ-2 Total Score  2  3  0  0  4  PHQ-9 Total Score  12  8  No  data  No data  12       Assessment and Plan: Debborah  is a 46 year old Caucasian female who has a history of schizoaffective disorder, anxiety disorder, insomnia, OSA on CPAP who presented to the clinic for a follow-up visit.  Zilphia continues to have anxiety provoking delusional thoughts, paranoia which are distressing to her.  She reports her paranoia is chronic but she continues to report some anxiety from the same.  She is currently compliant with her monthly Invega Sustenna injections.  She denies any side effects.  She has good social support and is on SSD.  She will continue to be a good candidate for outpatient treatment.  Plan as noted below.  New  Plan For schizoaffective disorder Continue Invega Sustenna IM to 234 mg q. 28 days, she will get it today .  She is due for her next injection 05/12/2017.  Reduced her Gean [place]chwood IM to 156 mg for next dose. Discontinue Invega  p.o. Start Trilafon 2 mg p.o. twice daily to augment her Invega sustenna IM. Continue Depakote 750 mg p.o. daily divided doses.  For anxiety symptoms Continue Viibryd 40 mg po daily.  For insomnia She will continue Melatonin OSA - she is on CPAP.  Provided lab slips to get lipid panel, TSH, hemoglobin A1c, prolactin, depakote level.  Ordered EKG to monitor her QTC.  Follow up in 1 month or sooner if needed.  This note was generated in part or whole with voice recognition software. Voice recognition is usually quite accurate but there are transcription errors that can and very often do occur. I apologize for any typographical errors that were not detected and corrected.         Jomarie Longs, MD 04/13/2017, 3:27 PM

## 2017-04-13 NOTE — Progress Notes (Signed)
right [place] invega 234mg  injection idc # [id number] lot # [id number] exp [id number] .  s/n # [id number]

## 2017-04-15 ENCOUNTER — Other Ambulatory Visit: Payer: Self-pay | Admitting: Family Medicine

## 2017-04-15 ENCOUNTER — Telehealth: Payer: Self-pay

## 2017-04-15 ENCOUNTER — Other Ambulatory Visit: Payer: Self-pay | Admitting: Psychiatry

## 2017-04-15 DIAGNOSIS — E119 Type 2 diabetes mellitus without complications: Secondary | ICD-10-CM

## 2017-04-15 DIAGNOSIS — F251 Schizoaffective disorder, depressive type: Secondary | ICD-10-CM

## 2017-04-15 MED ORDER — PERPHENAZINE 2 MG PO TABS: 2.0000 mg | ORAL_TABLET | Freq: Two times a day (BID) | ORAL | 1 refills | Status: DC

## 2017-04-15 NOTE — Telephone Encounter (Signed)
Received request for 90 day supply for trilafon.  [facility] to [facility]

## 2017-04-15 NOTE — Telephone Encounter (Signed)
  received a fax today requesting a 90 day supply of the perphenazine 2mg    perphenazine (TRILAFON) 2 MG tablet 60 tablet 1 04/13/2017    Sig - Route: Take 1 tablet (2 mg total) by mouth 2 (two) times daily. - Oral   Sent to pharmacy as: perphenazine (TRILAFON) 2 MG tablet   E-Prescribing Status: Receipt confirmed by pharmacy (04/13/2017 3:09 PM EST)

## 2017-04-15 NOTE — Telephone Encounter (Signed)
done

## 2017-04-23 ENCOUNTER — Encounter: Payer: Self-pay | Admitting: Family Medicine

## 2017-04-23 ENCOUNTER — Ambulatory Visit: Payer: Medicare Other | Admitting: Family Medicine

## 2017-04-23 VITALS — BP 119/74 | HR 76 | Temp 97.9°F | Wt 245.6 lb

## 2017-04-23 DIAGNOSIS — I10 Essential (primary) hypertension: Secondary | ICD-10-CM

## 2017-04-23 DIAGNOSIS — E6609 Other obesity due to excess calories: Secondary | ICD-10-CM

## 2017-04-23 DIAGNOSIS — E7800 Pure hypercholesterolemia, unspecified: Secondary | ICD-10-CM | POA: Diagnosis not present

## 2017-04-23 DIAGNOSIS — E119 Type 2 diabetes mellitus without complications: Secondary | ICD-10-CM | POA: Diagnosis not present

## 2017-04-23 MED ORDER — EMPAGLIFLOZIN 10 MG PO TABS: 10.0000 mg | ORAL_TABLET | Freq: Every day | ORAL | 2 refills | Status: DC

## 2017-04-23 MED ORDER — HYDROCHLOROTHIAZIDE 12.5 MG PO TABS: ORAL_TABLET | ORAL | 1 refills | Status: DC

## 2017-04-23 NOTE — Progress Notes (Signed)
BP 119/74 (BP Location: Left Arm, Patient Position: Sitting, Cuff Size: Large)   Pulse 76   Temp 97.9 F (36.6 C) (Oral)   Wt 245 lb 9.6 oz (111.4 kg)   SpO2 97%   BMI 43.51 kg/m    Subjective:    Patient ID: Tabitha [name], female    DOB: 11/07/1970, 46 y.o.   MRN: [id number]  HPI: Tabitha [name] is a 46 y.o. female  Chief Complaint  Patient presents with  . Follow-up    3 month. Patient states no complaints.  . Hypertension  . Diabetes  . Hyperlipidemia  . Medication Refill    Patient states she needs a refill on Doxycycline.   Patient presents today for 3 month f/u. Using 40 units BID with her levemir and taking metformin and actos daily. At home sugars have been anywhere from 200-300 post-prandial. Had fatigue with the invokana so d/c'd that several months ago.   BPs have been WNL on HCTZ. Denies CP, SOB, dizziness, HAs. Taking faithfully without side effects.   Doing well on lipitor, no side effects.   Past Medical History:  Diagnosis Date  . Allergic rhinitis   . Anxiety   . Asthma   . Bipolar disorder (HCC)   . Depression   . Diabetes mellitus without complication (HCC)   . GERD (gastroesophageal reflux disease)   . Hyperlipidemia   . Mild sleep apnea   . OCD (obsessive compulsive disorder)   . Palpitations   . Paranoia (HCC)   . Schizoaffective disorder (HCC)   . Sleep apnea    Social History   Socioeconomic History  . Marital status: Married    Spouse name: Not on file  . Number of children: Not on file  . Years of education: Not on file  . Highest education level: Not on file  Social Needs  . Financial resource strain: Not on file  . Food insecurity - worry: Not on file  . Food insecurity - inability: Not on file  . Transportation needs - medical: Not on file  . Transportation needs - non-medical: Not on file  Occupational History  . Not on file  Tobacco Use  . Smoking status: Never Smoker  . Smokeless tobacco: Never Used  Substance  and Sexual Activity  . Alcohol use: No    Alcohol/week: 0.0 oz  . Drug use: No  . Sexual activity: Yes    Partners: Male    Birth control/protection: None, Surgical  Other Topics Concern  . Not on file  Social History Narrative  . Not on file   Relevant past medical, surgical, family and social history reviewed and updated as indicated. Interim medical history since our last visit reviewed. Allergies and medications reviewed and updated.  Review of Systems  Constitutional: Negative.   Respiratory: Negative.   Cardiovascular: Negative.   Gastrointestinal: Negative.   Genitourinary: Negative.   Musculoskeletal: Negative.   Neurological: Negative.   Psychiatric/Behavioral: Negative.     Per HPI unless specifically indicated above     Objective:    BP 119/74 (BP Location: Left Arm, Patient Position: Sitting, Cuff Size: Large)   Pulse 76   Temp 97.9 F (36.6 C) (Oral)   Wt 245 lb 9.6 oz (111.4 kg)   SpO2 97%   BMI 43.51 kg/m   Wt Readings from Last 3 Encounters:  04/23/17 245 lb 9.6 oz (111.4 kg)  03/22/17 244 lb 11.2 oz (111 kg)  01/18/17 248 lb 12.8 oz (112.9 kg)  Physical Exam  Constitutional: She is oriented to person, place, and time. No distress.  Obese  HENT:  Head: Atraumatic.  Eyes: Conjunctivae are normal. Pupils are equal, round, and reactive to light. No scleral icterus.  Neck: Normal range of motion. Neck supple.  Cardiovascular: Normal rate, regular rhythm and normal heart sounds.  Pulmonary/Chest: Effort normal and breath sounds normal. No respiratory distress.  Musculoskeletal: Normal range of motion.  Lymphadenopathy:    She has no cervical adenopathy.  Neurological: She is alert and oriented to person, place, and time.  Skin: Skin is warm and dry.  Psychiatric: She has a normal mood and affect. Her behavior is normal.  Nursing note and vitals reviewed.  Results for orders placed or performed in visit on 04/23/17  Bayer DCA Hb A1c Waived    Result Value Ref Range   Bayer DCA Hb A1c Waived 8.3 (H) <7.0 %  Basic metabolic panel  Result Value Ref Range   Glucose 211 (H) 65 - 99 mg/dL   BUN 11 6 - 24 mg/dL   Creatinine, Ser 4.540.63 0.57 - 1.00 mg/dL   GFR calc non Af Amer 109 >59 mL/min/1.73   GFR calc Af Amer 125 >59 mL/min/1.73   BUN/Creatinine Ratio 17 9 - 23   Sodium 137 134 - 144 mmol/L   Potassium 4.4 3.5 - 5.2 mmol/L   Chloride 98 96 - 106 mmol/L   CO2 24 20 - 29 mmol/L   Calcium 9.5 8.7 - 10.2 mg/dL  LP+ALT+AST Piccolo, Waived  Result Value Ref Range   ALT (SGPT) Piccolo, Waived 35 10 - 47 U/L   AST (SGOT) Piccolo, Waived 24 11 - 38 U/L   Cholesterol Piccolo, Waived 176 <200 mg/dL   HDL Chol Piccolo, Waived 51 (L) >59 mg/dL   Triglycerides Piccolo,Waived 253 (H) <150 mg/dL   Chol/HDL Ratio Piccolo,Waive 3.5 mg/dL   LDL Chol Calc Piccolo Waived 74 <100 mg/dL   VLDL Chol Calc Piccolo,Waive 51 (H) <30 mg/dL      Assessment & Plan:   Problem List Items Addressed This Visit      Cardiovascular and Mediastinum   HBP (high blood pressure)    Stable and WNL, continue current regimen      Relevant Medications   hydrochlorothiazide (HYDRODIURIL) 12.5 MG tablet   Other Relevant Orders   Basic metabolic panel (Completed)     Endocrine   Diabetes mellitus type 2, controlled, without complications (HCC) - Primary    A1C up 1 full point, will try starting jardiance as she had fatigue side effects with invokana. Continue other medications. Keep log of fasting and post-prandial sugars, may need to add a mealtime insulin if no good improvement with jardiance. Hoping to get [facility] with [facility] for consult and lifestyle modifications      Relevant Medications   empagliflozin (JARDIANCE) 10 MG TABS tablet   Other Relevant Orders   Bayer DCA Hb A1c Waived (Completed)   Basic metabolic panel (Completed)   LP+ALT+AST Piccolo, Waived (Completed)     Other   Hyperlipidemia    Lipids stable, continue lipitor  and work on lifestyle modifications      Relevant Medications   hydrochlorothiazide (HYDRODIURIL) 12.5 MG tablet   Other Relevant Orders   LP+ALT+AST Piccolo, Waived (Completed)   Obesity    Cancelled appt with [facility] as she wasn't sure about insurance coverage. Will call insurance company and check on coverage, strongly encouraged her to get in with them if  at all possible to discuss some healthy plans and changes      Relevant Medications   empagliflozin (JARDIANCE) 10 MG TABS tablet       Follow up plan: Return in about 3 months (around 07/22/2017) for A1C.

## 2017-04-24 LAB — BASIC METABOLIC PANEL
BUN / CREAT RATIO: 17 (ref 9–23)
BUN: 11 mg/dL (ref 6–24)
CHLORIDE: 98 mmol/L (ref 96–106)
CO2: 24 mmol/L (ref 20–29)
CREATININE: 0.63 mg/dL (ref 0.57–1.00)
Calcium: 9.5 mg/dL (ref 8.7–10.2)
GFR calc non Af Amer: 109 mL/min/{1.73_m2} (ref >59)
GFR, EST AFRICAN AMERICAN: 125 mL/min/{1.73_m2} (ref >59)
Glucose: 211 mg/dL — ABNORMAL HIGH (ref 65–99)
Potassium: 4.4 mmol/L (ref 3.5–5.2)
Sodium: 137 mmol/L (ref 134–144)

## 2017-04-26 LAB — LP+ALT+AST PICCOLO, WAIVED
ALT (SGPT) PICCOLO, WAIVED: 35 U/L (ref 10–47)
AST (SGOT) PICCOLO, WAIVED: 24 U/L (ref 11–38)
CHOL/HDL RATIO PICCOLO,WAIVE: 3.5 mg/dL
Cholesterol Piccolo, Waived: 176 mg/dL (ref <200)
HDL Chol Piccolo, Waived: 51 mg/dL — ABNORMAL LOW (ref >59)
LDL Chol Calc Piccolo Waived: 74 mg/dL (ref <100)
Triglycerides Piccolo,Waived: 253 mg/dL — ABNORMAL HIGH (ref <150)
VLDL Chol Calc Piccolo,Waive: 51 mg/dL — ABNORMAL HIGH (ref <30)

## 2017-04-26 LAB — BAYER DCA HB A1C WAIVED: HB A1C: 8.3 % — AB (ref <7.0)

## 2017-04-26 NOTE — Assessment & Plan Note (Signed)
Stable and WNL, continue current regimen 

## 2017-04-26 NOTE — Assessment & Plan Note (Signed)
Cancelled appt with [facility] as she wasn't sure about insurance coverage. Will call insurance company and check on coverage, strongly encouraged her to get in with them if at all possible to discuss some healthy plans and changes

## 2017-04-26 NOTE — Assessment & Plan Note (Signed)
A1C up 1 full point, will try starting jardiance as she had fatigue side effects with invokana. Continue other medications. Keep log of fasting and post-prandial sugars, may need to add a mealtime insulin if no good improvement with jardiance. Hoping to get [facility] in with [facility] soon for consult and lifestyle modifications

## 2017-04-26 NOTE — Assessment & Plan Note (Signed)
Lipids stable, continue lipitor and work on lifestyle modifications

## 2017-04-26 NOTE — Patient Instructions (Signed)
Follow up in 3 months

## 2017-05-06 ENCOUNTER — Telehealth: Payer: Self-pay

## 2017-05-06 NOTE — Telephone Encounter (Signed)
Ok I will see her when she comes in. Thank you.

## 2017-05-06 NOTE — Telephone Encounter (Signed)
pt called stated that she stopped taking perphenazine on 04-15-17 that she was having difficulty breathing. pt states that she also has not had labwork done and that her pcp would not do a ekg because cause of coding.   Pt has an appt for 05-12-17

## 2017-05-12 ENCOUNTER — Other Ambulatory Visit: Payer: Self-pay

## 2017-05-12 ENCOUNTER — Ambulatory Visit (INDEPENDENT_AMBULATORY_CARE_PROVIDER_SITE_OTHER): Payer: Medicare Other

## 2017-05-12 ENCOUNTER — Encounter: Payer: Self-pay | Admitting: Psychiatry

## 2017-05-12 ENCOUNTER — Ambulatory Visit: Payer: Medicare Other | Admitting: Psychiatry

## 2017-05-12 VITALS — BP 135/81 | HR 101 | Temp 98.2°F | Wt 238.6 lb

## 2017-05-12 DIAGNOSIS — Z9989 Dependence on other enabling machines and devices: Secondary | ICD-10-CM | POA: Diagnosis not present

## 2017-05-12 DIAGNOSIS — G4733 Obstructive sleep apnea (adult) (pediatric): Secondary | ICD-10-CM

## 2017-05-12 DIAGNOSIS — F259 Schizoaffective disorder, unspecified: Secondary | ICD-10-CM

## 2017-05-12 DIAGNOSIS — F429 Obsessive-compulsive disorder, unspecified: Secondary | ICD-10-CM | POA: Diagnosis not present

## 2017-05-12 DIAGNOSIS — F3170 Bipolar disorder, currently in remission, most recent episode unspecified: Secondary | ICD-10-CM

## 2017-05-12 DIAGNOSIS — F251 Schizoaffective disorder, depressive type: Secondary | ICD-10-CM

## 2017-05-12 DIAGNOSIS — F3130 Bipolar disorder, current episode depressed, mild or moderate severity, unspecified: Secondary | ICD-10-CM

## 2017-05-12 DIAGNOSIS — F250 Schizoaffective disorder, bipolar type: Secondary | ICD-10-CM

## 2017-05-12 MED ORDER — VILAZODONE HCL 40 MG PO TABS: 40.0000 mg | ORAL_TABLET | Freq: Every morning | ORAL | 0 refills | Status: DC

## 2017-05-12 MED ORDER — BENZTROPINE MESYLATE 1 MG PO TABS: 1.0000 mg | ORAL_TABLET | Freq: Every day | ORAL | 1 refills | Status: DC

## 2017-05-12 MED ORDER — GABAPENTIN 400 MG PO CAPS
400.0000 mg | ORAL_CAPSULE | Freq: Three times a day (TID) | ORAL | 0 refills | Status: DC
Start: 2017-05-12 — End: 2017-08-19

## 2017-05-12 MED ORDER — PALIPERIDONE PALMITATE 156 MG/ML IM SUSP: 156.0000 mg | INTRAMUSCULAR | 2 refills | Status: DC

## 2017-05-12 MED ORDER — OLANZAPINE 5 MG PO TABS
5.0000 mg | ORAL_TABLET | Freq: Every day | ORAL | 1 refills | Status: DC
Start: 2017-05-12 — End: 2017-06-15

## 2017-05-12 MED ORDER — PALIPERIDONE PALMITATE 156 MG/ML IM SUSP
156.0000 mg | Freq: Once | INTRAMUSCULAR | Status: AC
  Administered 2017-05-12: 156 mg via INTRAMUSCULAR

## 2017-05-12 NOTE — Patient Instructions (Signed)
Calorie Counting for Weight Loss Calories are units of energy. Your body needs a certain amount of calories from food to keep you going throughout the day. When you eat more calories than your body needs, your body stores the extra calories as fat. When you eat fewer calories than your body needs, your body burns fat to get the energy it needs. Calorie counting means keeping track of how many calories you eat and drink each day. Calorie counting can be helpful if you need to lose weight. If you make sure to eat fewer calories than your body needs, you should lose weight. Ask your health care provider what a healthy weight is for you. For calorie counting to work, you will need to eat the right number of calories in a day in order to lose a healthy amount of weight per week. A dietitian can help you determine how many calories you need in a day and will give you suggestions on how to reach your calorie goal.  A healthy amount of weight to lose per week is usually 1-2 lb (0.5-0.9 kg). This usually means that your daily calorie intake should be reduced by 500-750 calories.  Eating 1,200 - 1,500 calories per day can help most women lose weight.  Eating 1,500 - 1,800 calories per day can help most men lose weight.  What is my plan? My goal is to have __________ calories per day. If I have this many calories per day, I should lose around __________ pounds per week. What do I need to know about calorie counting? In order to meet your daily calorie goal, you will need to:  Find out how many calories are in each food you would like to eat. Try to do this before you eat.  Decide how much of the food you plan to eat.  Write down what you ate and how many calories it had. Doing this is called keeping a food log.  To successfully lose weight, it is important to balance calorie counting with a healthy lifestyle that includes regular activity. Aim for 150 minutes of moderate exercise (such as walking) or 75  minutes of vigorous exercise (such as running) each week. Where do I find calorie information?  The number of calories in a food can be found on a Nutrition Facts label. If a food does not have a Nutrition Facts label, try to look up the calories online or ask your dietitian for help. Remember that calories are listed per serving. If you choose to have more than one serving of a food, you will have to multiply the calories per serving by the amount of servings you plan to eat. For example, the label on a package of bread might say that a serving size is 1 slice and that there are 90 calories in a serving. If you eat 1 slice, you will have eaten 90 calories. If you eat 2 slices, you will have eaten 180 calories. How do I keep a food log? Immediately after each meal, record the following information in your food log:  What you ate. Don't forget to include toppings, sauces, and other extras on the food.  How much you ate. This can be measured in cups, ounces, or number of items.  How many calories each food and drink had.  The total number of calories in the meal.  Keep your food log near you, such as in a small notebook in your pocket, or use a mobile app or website. Some   programs will calculate calories for you and show you how many calories you have left for the day to meet your goal. What are some calorie counting tips?  Use your calories on foods and drinks that will fill you up and not leave you hungry: ? Some examples of foods that fill you up are nuts and nut butters, vegetables, lean proteins, and high-fiber foods like whole grains. High-fiber foods are foods with more than 5 g fiber per serving. ? Drinks such as sodas, specialty coffee drinks, alcohol, and juices have a lot of calories, yet do not fill you up.  Eat nutritious foods and avoid empty calories. Empty calories are calories you get from foods or beverages that do not have many vitamins or protein, such as candy, sweets, and  soda. It is better to have a nutritious high-calorie food (such as an avocado) than a food with few nutrients (such as a bag of chips).  Know how many calories are in the foods you eat most often. This will help you calculate calorie counts faster.  Pay attention to calories in drinks. Low-calorie drinks include water and unsweetened drinks.  Pay attention to nutrition labels for "low fat" or "fat free" foods. These foods sometimes have the same amount of calories or more calories than the full fat versions. They also often have added sugar, starch, or salt, to make up for flavor that was removed with the fat.  Find a way of tracking calories that works for you. Get creative. Try different apps or programs if writing down calories does not work for you. What are some portion control tips?  Know how many calories are in a serving. This will help you know how many servings of a certain food you can have.  Use a measuring cup to measure serving sizes. You could also try weighing out portions on a kitchen scale. With time, you will be able to estimate serving sizes for some foods.  Take some time to put servings of different foods on your favorite plates, bowls, and cups so you know what a serving looks like.  Try not to eat straight from a bag or box. Doing this can lead to overeating. Put the amount you would like to eat in a cup or on a plate to make sure you are eating the right portion.  Use smaller plates, glasses, and bowls to prevent overeating.  Try not to multitask (for example, watch TV or use your computer) while eating. If it is time to eat, sit down at a table and enjoy your food. This will help you to know when you are full. It will also help you to be aware of what you are eating and how much you are eating. What are tips for following this plan? Reading food labels  Check the calorie count compared to the serving size. The serving size may be smaller than what you are used to  eating.  Check the source of the calories. Make sure the food you are eating is high in vitamins and protein and low in saturated and trans fats. Shopping  Read nutrition labels while you shop. This will help you make healthy decisions before you decide to purchase your food.  Make a grocery list and stick to it. Cooking  Try to cook your favorite foods in a healthier way. For example, try baking instead of frying.  Use low-fat dairy products. Meal planning  Use more fruits and vegetables. Half of your plate should   be fruits and vegetables.  Include lean proteins like poultry and fish. How do I count calories when eating out?  Ask for smaller portion sizes.  Consider sharing an entree and sides instead of getting your own entree.  If you get your own entree, eat only half. Ask for a box at the beginning of your meal and put the rest of your entree in it so you are not tempted to eat it.  If calories are listed on the menu, choose the lower calorie options.  Choose dishes that include vegetables, fruits, whole grains, low-fat dairy products, and lean protein.  Choose items that are boiled, broiled, grilled, or steamed. Stay away from items that are buttered, battered, fried, or served with cream sauce. Items labeled "crispy" are usually fried, unless stated otherwise.  Choose water, low-fat milk, unsweetened iced tea, or other drinks without added sugar. If you want an alcoholic beverage, choose a lower calorie option such as a glass of wine or light beer.  Ask for dressings, sauces, and syrups on the side. These are usually high in calories, so you should limit the amount you eat.  If you want a salad, choose a garden salad and ask for grilled meats. Avoid extra toppings like bacon, cheese, or fried items. Ask for the dressing on the side, or ask for olive oil and vinegar or lemon to use as dressing.  Estimate how many servings of a food you are given. For example, a serving of  cooked rice is  cup or about the size of half a baseball. Knowing serving sizes will help you be aware of how much food you are eating at restaurants. The list below tells you how big or small some common portion sizes are based on everyday objects: ? 1 oz-4 stacked dice. ? 3 oz-1 deck of cards. ? 1 tsp-1 die. ? 1 Tbsp- a ping-pong ball. ? 2 Tbsp-1 ping-pong ball. ?  cup- baseball. ? 1 cup-1 baseball. Summary  Calorie counting means keeping track of how many calories you eat and drink each day. If you eat fewer calories than your body needs, you should lose weight.  A healthy amount of weight to lose per week is usually 1-2 lb (0.5-0.9 kg). This usually means reducing your daily calorie intake by 500-750 calories.  The number of calories in a food can be found on a Nutrition Facts label. If a food does not have a Nutrition Facts label, try to look up the calories online or ask your dietitian for help.  Use your calories on foods and drinks that will fill you up, and not on foods and drinks that will leave you hungry.  Use smaller plates, glasses, and bowls to prevent overeating. This information is not intended to replace advice given to you by your health care provider. Make sure you discuss any questions you have with your health care provider. Document Released: 04/13/2005 Document Revised: 03/13/2016 Document Reviewed: 03/13/2016 Elsevier Interactive Patient Education  2018 [employer] Benztropine tablets What is this medicine? BENZTROPINE (BENZ troe peen) is for certain movement problems due to Parkinson's disease, certain medicines, or other causes. This medicine may be used for other purposes; ask your health care provider or pharmacist if you have questions. COMMON BRAND NAME(S): Cogentin What should I tell my health care provider before I take this medicine? They need to know if you have any of these conditions: -glaucoma -heart disease or a rapid heartbeat -mental  problems -prostate trouble -tardive dyskinesia -an  unusual or allergic reaction to benztropine, other medicines, lactose, foods, dyes, or preservatives -pregnant or trying to get pregnant -breast-feeding How should I use this medicine? Take this medicine by mouth with a full glass of water. Follow the directions on the prescription label. Take your medicine at regular intervals. Do not take your medicine more often than directed. Talk to your pediatrician regarding the use of this medicine in children. While this drug may be prescribed for children as young as 3 years for selected conditions, precautions do apply. Overdosage: If you think you have taken too much of this medicine contact a poison control center or emergency room at once. NOTE: This medicine is only for you. Do not share this medicine with others. What if I miss a dose? If you miss a dose, take it as soon as you can. If it is almost time for your next dose, take only that dose. Do not take double or extra doses. What may interact with this medicine? -haloperidol -medicines for movement abnormalities like Parkinson's disease -phenothiazines like chlorpromazine, mesoridazine, prochlorperazine, thioridazine -some antidepressants like amitriptyline, desipramine, doxepin, nortriptyline -stimulant medicines for attention, weight loss, and to stay awake -tegaserod This list may not describe all possible interactions. Give your health care provider a list of all the medicines, herbs, non-prescription drugs, or dietary supplements you use. Also tell them if you smoke, drink alcohol, or use illegal drugs. Some items may interact with your medicine. What should I watch for while using this medicine? Visit your doctor or health care professional for regular checks on your progress. You may get drowsy or dizzy. Do not drive, use machinery, or do anything that needs mental alertness until you know how this medicine affects you. Do not stand  or sit up quickly, especially if you are an older patient. This reduces the risk of dizzy or fainting spells. Alcohol may interfere with the effect of this medicine. Avoid alcoholic drinks. Your mouth may get dry. Chewing sugarless gum or sucking hard candy, and drinking plenty of water may help. Contact your doctor if the problem does not go away or is severe. This medicine may cause dry eyes and blurred vision. If you wear contact lenses you may feel some discomfort. Lubricating drops may help. See your eye doctor if the problem does not go away or is severe. You may sweat less than usual while you are taking this medicine. As a result your body temperature could rise to a dangerous level. Be careful not to get overheated during exercise or in hot weather. You could get heat stroke. Avoid taking hot baths and using hot tubs and saunas. What side effects may I notice from receiving this medicine? Side effects that you should report to your doctor or health care professional as soon as possible: -allergic reactions like skin rash, itching or hives, swelling of the face, lips, or tongue -changes in vision -confusion -decreased sweating or heat intolerance -depression -fast, irregular heartbeat -hallucinations -memory loss -muscle weakness -pain or difficulty passing urine -vomiting Side effects that usually do not require medical attention (report to your doctor or health care professional if they continue or are bothersome): -constipation -dry mouth -nausea This list may not describe all possible side effects. Call your doctor for medical advice about side effects. You may report side effects to FDA at 1-800-FDA-1088. Where should I keep my medicine? Keep out of the reach of children. Store below 30 degrees C (86 degrees F). Keep container tightly closed. Throw away any  unused medicine after the expiration date. NOTE: This sheet is a summary. It may not cover all possible information. If you  have questions about this medicine, talk to your doctor, pharmacist, or health care provider.  2018 Elsevier/Gold Standard (2007-07-13 15:38:20) Olanzapine tablets What is this medicine? OLANZAPINE (oh LAN za peen) is used to treat schizophrenia, psychotic disorders, and bipolar disorder. Bipolar disorder is also known as manic-depression. This medicine may be used for other purposes; ask your health care provider or pharmacist if you have questions. COMMON BRAND NAME(S): Zyprexa What should I tell my health care provider before I take this medicine? They need to know if you have any of these conditions: -breast cancer or history of breast cancer -cigarette smoker -dementia -diabetes mellitus, high blood sugar or a family history of diabetes -difficulty swallowing -glaucoma -heart disease, irregular heartbeat, or previous heart attack -history of brain tumor or head injury -kidney or liver disease -low blood pressure or dizziness when standing up -Parkinson's disease -prostate trouble -seizures (convulsions) -suicidal thoughts, plans, or attempt by you or a family member -an unusual or allergic reaction to olanzapine, other medicines, foods, dyes, or preservatives -pregnant or trying to get pregnant -breast-feeding How should I use this medicine? Take this medicine by mouth. Swallow it with a drink of water. Follow the directions on the prescription label. Take your medicine at regular intervals. Do not take it more often than directed. Do not stop taking except on the advice of your doctor or health care professional. A special MedGuide will be given to you by the pharmacist with each new prescription and refill. Be sure to read this information carefully each time. Talk to your pediatrician regarding the use of this medicine in children. While this drug may be prescribed for children as young as 13 years for selected conditions, precautions do apply. Overdosage: If you think you have  taken too much of this medicine contact a poison control center or emergency room at once. NOTE: This medicine is only for you. Do not share this medicine with others. What if I miss a dose? If you miss a dose, take it as soon as you can. If it is almost time for your next dose, take only that dose. Do not take double or extra doses. What may interact with this medicine? Do not take this medicine with any of the following medications: -certain antibiotics like grepafloxacin and sparfloxacin -certain phenothiazines like chlorpromazine, mesoridazine, and thioridazine -cisapride -clozapine -droperidol -halofantrine -levomethadyl -pimozide This medicine may also interact with the following medications: -carbamazepine -charcoal -fluvoxamine -levodopa and other medicines for Parkinson's disease -medicines for diabetes -medicines for high blood pressure -medicines for mental depression, anxiety, other mood disorders, or sleeping problems -omeprazole -rifampin -ritonavir -tobacco from cigarettes This list may not describe all possible interactions. Give your health care provider a list of all the medicines, herbs, non-prescription drugs, or dietary supplements you use. Also tell them if you smoke, drink alcohol, or use illegal drugs. Some items may interact with your medicine. What should I watch for while using this medicine? Visit your doctor or health care professional for regular checks on your progress. It may be several weeks before you see the full effects of this medicine. Notify your doctor or health care professional if your symptoms get worse, if you have new symptoms, if you are having an unusual effect from this medicine, or if you feel out of control, very discouraged or think you might harm yourself or others. Do not suddenly  stop taking this medicine. You may need to gradually reduce the dose. Ask your doctor or health care professional for advice. You may get dizzy or drowsy.  Do not drive, use machinery, or do anything that needs mental alertness until you know how this medicine affects you. Do not stand or sit up quickly, especially if you are an older patient. This reduces the risk of dizzy or fainting spells. Avoid alcoholic drinks. Alcohol can increase dizziness and drowsiness with olanzapine. Do not treat yourself for colds, diarrhea or allergies without asking your doctor or health care professional for advice. Some ingredients can increase possible side effects. Your mouth may get dry. Chewing sugarless gum or sucking hard candy, and drinking plenty of water will help. This medicine can reduce the response of your body to heat or cold. Dress warm in cold weather and stay hydrated in hot weather. If possible, avoid extreme temperatures like saunas, hot tubs, very hot or cold showers, or activities that can cause dehydration such as vigorous exercise. If you notice an increased hunger or thirst, different from your normal hunger or thirst, or if you find that you have to urinate more frequently, you should contact your health care provider as soon as possible. You may need to have your blood sugar monitored. This medicine may cause changes in your blood sugar levels. You should monitor you blood sugar frequently if you have diabetes. If you smoke, tell your doctor if you notice this medicine is not working well for you. Talk to your doctor if you are a smoker or if you decide to stop smoking. What side effects may I notice from receiving this medicine? Side effects that you should report to your doctor or health care professional as soon as possible: -allergic reactions like skin rash, itching or hives, swelling of the face, lips, or tongue -breathing problems -difficulty in speaking or swallowing -excessive thirst and/or hunger -fast heartbeat (palpitations) -fever or chills, sore throat -fever with rash, swollen lymph nodes, or swelling of the face -frequently  needing to urinate -inability to control muscle movements in the face, hands, arms, or legs -painful or prolonged erections -redness, blistering, peeling or loosening of the skin, including inside the mouth -restlessness or need to keep moving -seizures (convulsions) -stiffness, spasms -tremors or trembling Side effects that usually do not require medical attention (report to your doctor or health care professional if they continue or are bothersome): -changes in sexual desire -constipation -drowsiness -lowered blood pressure This list may not describe all possible side effects. Call your doctor for medical advice about side effects. You may report side effects to FDA at 1-800-FDA-1088. Where should I keep my medicine? Keep out of the reach of children. Store at controlled room temperature between 15 and 30 degrees C (59 and 86 degrees F). Protect from light and moisture. Throw away any unused medicine after the expiration date. NOTE: This sheet is a summary. It may not cover all possible information. If you have questions about this medicine, talk to your doctor, pharmacist, or health care provider.  2018 Elsevier/Gold Standard (2014-09-04 17:33:14)

## 2017-05-12 NOTE — Progress Notes (Signed)
right [place] invega 234mg  injection idc # [id number] .  s/n # [id number] exp [id number]  lot # [id number] pt states that she did  not think that the shot was working that she was still have some paranoid that people at church were after her. pt was advised to let the doctor know and that I would contact her in a couple of days and see if changes to medication helpped.

## 2017-05-12 NOTE — Progress Notes (Signed)
BH MD OP Progress Note  05/12/2017 4:50 PM Darl HouseholderKathleen J Zelenak  MRN:  [id number]  Chief Complaint: ' I am anxious and paranoid."  Chief Complaint    Follow-up; Medication Refill; Paranoid     HPI: Nicholos JohnsKathleen is a 47 year old married, Caucasian female, who is on SSD, lives in [place], presented to the clinic today for a follow-up visit.  Patient has a history of schizoaffective disorder as well as unspecified OCD symptoms.  Nicholos JohnsKathleen today presents for her follow-up as well as her monthly [place]Invega Sustenna injection.  She reports she tried the new medication Trilafon for a couple of days but then she developed EPS and felt anxious and stopped taking it.  She reports she feels her paranoia and delusional thoughts are worse now.  Her delusions are always connected to the church.  It happens mostly on Sundays when she goes to church.  She reports that most recently she went to church and felt like someone was staring at her from the front and she had to stare back at him the entire time.  She also felt like him there may have been cameras installed in the charge which also made her anxious.  Nicholos JohnsKathleen continues to have anxiety provoking thoughts/obsessive thoughts about salvation.  She spends a lot of time obsessing and ruminating about it.  She reports she copes by chanting and praying.  Patient continues to struggle with sleep.  She reports she takes melatonin.  She also has OSA and is on CPAP.  She reports her sleep continues to be restless.  She denies any suicidality or self-injurious behavior.  Her husband continues to be supportive.  She lives with her husband and 2 children.  Provided supportive psychotherapy.  Discussed with patient about referring her for CBT.  Patient reports she wants to think about it.  She was not able to get her labs as ordered last time.  She has not been able to get her Depakote level yet.  Provided with her another slip so she can go to [facility] and get it done.  She  agrees with the plan. Visit Diagnosis:    ICD-10-CM   1. Schizoaffective disorder, depressive type (HCC) F25.1 gabapentin (NEURONTIN) 400 MG capsule    OLANZapine (ZYPREXA) 5 MG tablet    Vilazodone HCl (VIIBRYD) 40 MG TABS    benztropine (COGENTIN) 1 MG tablet    paliperidone (INVEGA SUSTENNA) 156 MG/ML SUSP injection    paliperidone (INVEGA SUSTENNA) injection 156 mg  2. Obsessive-compulsive disorder, unspecified type F42.9   3. OSA on CPAP G47.33    Z99.89     Past Psychiatric History: Reports history of schizoaffective disorder, OCD.  She used to follow up with [facility] in [place] in the past and then with Dr. Garnetta BuddyFaheem.  She denies any suicide attempts.  Denies any inpatient mental health admissions  Past Medical History:  Past Medical History:  Diagnosis Date  . Allergic rhinitis   . Anxiety   . Asthma   . Bipolar disorder (HCC)   . Depression   . Diabetes mellitus without complication (HCC)   . GERD (gastroesophageal reflux disease)   . Hyperlipidemia   . Mild sleep apnea   . OCD (obsessive compulsive disorder)   . Palpitations   . Paranoia (HCC)   . Schizoaffective disorder (HCC)   . Sleep apnea     Past Surgical History:  Procedure Laterality Date  . CESAREAN SECTION  2006/2008  . CHOLECYSTECTOMY  2006    Family Psychiatric  History: Denies  Family History:  Family History  Problem Relation Age of Onset  . Diabetes Father   . Hyperlipidemia Father   . Diabetes Brother   . Lupus Mother   . Diabetes Mother        pre diabetes   Substance abuse history: Denies  Social History: She is married, lives with her husband.  She is on SSD.  Her husband is also disabled.  He is a double amputee, from a past history of abdominal gangrene which spread.  She has 2 children aged 41 and 57 years old. Social History   Socioeconomic History  . Marital status: Married    Spouse name: kevin  . Number of children: 2  . Years of education: None  . Highest education  level: Bachelor's degree (e.g., BA, AB, BS)  Social Needs  . Financial resource strain: Not hard at all  . Food insecurity - worry: Sometimes true  . Food insecurity - inability: Sometimes true  . Transportation needs - medical: No  . Transportation needs - non-medical: No  Occupational History  . None  Tobacco Use  . Smoking status: Never Smoker  . Smokeless tobacco: Never Used  Substance and Sexual Activity  . Alcohol use: No    Alcohol/week: 0.0 oz  . Drug use: No  . Sexual activity: Yes    Partners: Male    Birth control/protection: None, Surgical  Other Topics Concern  . None  Social History Narrative  . None    Allergies:  Allergies  Allergen Reactions  . Abilify [Aripiprazole] Hives    Metabolic Disorder Labs: Lab Results  Component Value Date   HGBA1C 6.6 (H) 06/18/2016   Lab Results  Component Value Date   PROLACTIN 36.2 (H) 03/26/2016   PROLACTIN 37.1 (H) 09/26/2015   Lab Results  Component Value Date   CHOL 176 04/23/2017   TRIG 253 (H) 04/23/2017   HDL 48 03/26/2016   CHOLHDL 2.9 09/26/2015   VLDL 51 (H) 04/23/2017   LDLCALC 63 03/26/2016   LDLCALC 31 09/26/2015   Lab Results  Component Value Date   TSH 2.500 03/26/2016   TSH 2.28 09/26/2015    Therapeutic Level Labs: No results found for: LITHIUM No results found for: VALPROATE No components found for:  CBMZ  Current Medications: Current Outpatient Medications  Medication Sig Dispense Refill  . albuterol (PROAIR HFA) 108 (90 Base) MCG/ACT inhaler Inhale 2 puffs into the lungs every 6 (six) hours as needed for wheezing or shortness of breath. 8.5 g 6  . atorvastatin (LIPITOR) 20 MG tablet TAKE 1 TABLET BY MOUTH EVERY NIGHT AT BEDTIME 30 tablet 12  . chlorpheniramine-HYDROcodone (TUSSIONEX) 10-8 MG/5ML SUER TK 5 ML PO Q 12 H PRN  0  . diclofenac sodium (VOLTAREN) 1 % GEL Apply 2 g topically 2 (two) times daily as needed. 100 g 0  . divalproex (DEPAKOTE) 250 MG DR tablet Take 1 tablet  (250 mg total) by mouth every morning. 90 tablet 0  . divalproex (DEPAKOTE) 500 MG DR tablet Take 1 tablet (500 mg total) by mouth every evening. 90 tablet 0  . doxycycline (VIBRAMYCIN) 50 MG capsule Take 1 capsule (50 mg total) by mouth every morning. 30 capsule 12  . empagliflozin (JARDIANCE) 10 MG TABS tablet Take 10 mg by mouth daily. 30 tablet 2  . fluticasone (FLOVENT HFA) 110 MCG/ACT inhaler Inhale 1 puff into the lungs 2 (two) times daily. 1 Inhaler 11  . glucose blood (ACCU-CHEK AVIVA PLUS)  test strip 1 each by Other route as needed for other. Use as instructed 100 each 12  . hydrochlorothiazide (HYDRODIURIL) 12.5 MG tablet TAKE 1 TABLET(12.5 MG) BY MOUTH DAILY 90 tablet 1  . Insulin Detemir (LEVEMIR) 100 UNIT/ML Pen Inject 40 Units into the skin 2 (two) times daily. 5 pen 11  . Insulin Pen Needle (BD PEN NEEDLE NANO U/F) 32G X 4 MM MISC 1 each by Does not apply route daily. 100 each 5  . metFORMIN (GLUCOPHAGE) 500 MG tablet TAKE 2 TABLETS BY MOUTH TWICE DAILY BEFORE A MEAL 180 tablet 0  . NOVOTWIST 32G X 5 MM MISC USE WITH LEVEMIR PEN BID  5  . omeprazole (PRILOSEC) 10 MG capsule TAKE 1 CAPSULE BY MOUTH EVERY DAY 30 capsule 5  . paliperidone (INVEGA SUSTENNA) 156 MG/ML SUSP injection Inject 1 mL (156 mg total) into the muscle every 28 (twenty-eight) days. Next dose in 28 days -06/09/2017 1 mL 2  . pioglitazone (ACTOS) 45 MG tablet Take 1 tablet (45 mg total) by mouth daily. 30 tablet 12  . Vilazodone HCl (VIIBRYD) 40 MG TABS Take 1 tablet (40 mg total) by mouth every morning. 90 tablet 0  . benztropine (COGENTIN) 1 MG tablet Take 1 tablet (1 mg total) by mouth at bedtime. 30 tablet 1  . gabapentin (NEURONTIN) 400 MG capsule Take 1 capsule (400 mg total) by mouth 3 (three) times daily. 270 capsule 0  . OLANZapine (ZYPREXA) 5 MG tablet Take 1 tablet (5 mg total) by mouth at bedtime. 30 tablet 1   No current facility-administered medications for this visit.       Musculoskeletal: Strength & Muscle Tone: within normal limits Gait & Station: normal Patient leans: N/A  Psychiatric Specialty Exam: Review of Systems  Psychiatric/Behavioral: The patient is nervous/anxious and has insomnia.   All other systems reviewed and are negative.   Blood pressure 135/81, pulse (!) 101, temperature 98.2 F (36.8 C), temperature source Oral, weight 238 lb 9.6 oz (108.2 kg).Body mass index is 42.27 kg/m.  General Appearance: Casual  Eye Contact:  Fair  Speech:  Clear and Coherent  Volume:  Normal  Mood:  Anxious  Affect:  Congruent  Thought Process:  Goal Directed and Descriptions of Associations: Intact  Orientation:  Full (Time, Place, and Person)  Thought Content: Delusions, Paranoid Ideation and Rumination   Suicidal Thoughts:  No  Homicidal Thoughts:  No  Memory:  Immediate;   Fair Recent;   Fair Remote;   Fair  Judgement:  Fair  Insight:  Fair  Psychomotor Activity:  Normal  Concentration:  Concentration: Fair and Attention Span: Fair  Recall:  [employer] of Knowledge: Fair  Language: Fair  Akathisia:  No  Handed:  Right  AIMS (if indicated): denies tremors, rigidity at this time  Assets:  Communication Skills Desire for Improvement Housing Intimacy Social Support  ADL's:  Intact  Cognition: WNL  Sleep:  restless   Screenings: AIMS     [facility] Visit from [facility]12/2017 in [facility] Office Visit from 07/29/2016 in [facility] Office Visit from 05/06/2016 in [facility] Office Visit from 04/01/2016 in [facility] Office Visit from 03/28/2015 in [facility]  AIMS Total Score  0  0  0  0  0    PHQ2-9     Clinical [facility] from 09/24/2015 in [facility] Office Visit from 06/20/2015  in [facility] Office Visit from  03/15/2015 in [facility] Office Visit from 01/08/2015 in [facility]  PHQ-2 Total Score  2  3  0  0  4  PHQ-9 Total Score  12  8  No data  No data  12       Assessment and Plan: Jacara is a 47 year old Caucasian female who has a history of schizoaffective disorder, anxiety disorder, insomnia, OSA on CPAP who presented to the clinic today for a follow-up visit.  Rionna continues to have anxiety provoking delusional thoughts, paranoia which are distressing to her.  She was started on a new antipsychotic last visit but she stopped taking it due to side effects.  She continues to be compliant on her monthly [place] injection which has been reduced to 156 mg at this time.  Discussed with her about pursuing psychotherapy.  Patient reports she wants to think about it.  She continues to have good social support and is on SSD.  She denies any suicidality and continues to be a good candidate at this time for outpatient treatment.  Plan as noted below.  Plan  Schizoaffective disorder Reduce Invega Sustenna IM to 156 mg q. 28 days.  She will get her on 156 mg IM dose today.  Next dose is on 06/09/2017. Discontinue Trilafon for lack of efficacy/side effects Start Zyprexa 5 mg p.o. Nightly Add Cogentin 1 mg p.o. nightly for EPS Continue Depakote 750 mg p.o. daily in divided doses.  For anxiety symptoms Continue Viibryd 40 mg p.o. daily Increase gabapentin to 400 mg 3 times daily.  She takes it for neuropathy.  For insomnia She will continue melatonin. Zyprexa will also help with her sleep. OSA-she is on CPAP.  Provided Slips to get her TSH, hemoglobin A1c, prolactin, Depakote level, vitamin B12, vitamin D as well as folate CMP and CBC with differential.  Shanda Bumps CMA will call patient and refer her for EKG for QTC monitoring since she is on multiple antipsychotics at this time.  Patient will think about pursuing psychotherapy and will let writer  know.  Refer for CBT if she agrees.  Follow-up in 4 weeks or sooner if needed.  More than 50 % of the time was spent for psychoeducation and supportive psychotherapy and care coordination. This note was generated in part or whole with voice recognition software. Voice recognition is usually quite accurate but there are transcription errors that can and very often do occur. I apologize for any typographical errors that were not detected and corrected.       Jomarie Longs, MD 05/12/2017, 4:50 PM

## 2017-05-13 NOTE — Telephone Encounter (Signed)
I do not want to give 90 days until patient takes the medication for a few days and tolerate it well. She is aware of this. Since this is a new medication, she has to try it out.  Please let pharmacy know.

## 2017-05-13 NOTE — Telephone Encounter (Signed)
received two request from pharmacy for a 90 day supply on the benztroptine 1.mg and for the olanzapine 5mg     benztropine (COGENTIN) 1 MG tablet 30 tablet 1 05/12/2017    Sig - Route: Take 1 tablet (1 mg total) by mouth at bedtime. - Oral   Sent to pharmacy as: benztropine (COGENTIN) 1 MG tablet   E-Prescribing Status: Receipt confirmed by pharmacy (05/12/2017 2:54 PM EST)     Disp Refills Start End   OLANZapine (ZYPREXA) 5 MG tablet 30 tablet 1 05/12/2017    Sig - Route: Take 1 tablet (5 mg total) by mouth at bedtime. - Oral   Sent to pharmacy as: OLANZapine (ZYPREXA) 5 MG tablet   E-Prescribing Status: Receipt confirmed by pharmacy (05/12/2017 2:54 PM EST)

## 2017-05-13 NOTE — Telephone Encounter (Signed)
Pharmacy notified.

## 2017-05-14 NOTE — Telephone Encounter (Signed)
called to see if pt went for ekg and to also check on patient to see how she was doing.  pt states that she will do ekg next week when they come back from trip and that she was having like panic feeling like she really anxious but other then that doing well.   Pt was told that I would call her next week to check and make sure she is doing ok and to remind to get ekg done.

## 2017-05-17 NOTE — Telephone Encounter (Signed)
OK 

## 2017-05-18 NOTE — Telephone Encounter (Signed)
Tc left message on voice mail to call office back and let us know if ekg was done.

## 2017-05-20 ENCOUNTER — Ambulatory Visit: Payer: Medicare Other | Admitting: Family Medicine

## 2017-05-20 VITALS — BP 148/79 | HR 64 | Temp 98.7°F | Wt 237.7 lb

## 2017-05-20 DIAGNOSIS — L719 Rosacea, unspecified: Secondary | ICD-10-CM

## 2017-05-20 DIAGNOSIS — H1031 Unspecified acute conjunctivitis, right eye: Secondary | ICD-10-CM

## 2017-05-20 MED ORDER — DOXYCYCLINE HYCLATE 50 MG PO CAPS: 50.0000 mg | ORAL_CAPSULE | Freq: Every morning | ORAL | 6 refills | Status: DC

## 2017-05-20 MED ORDER — POLYMYXIN B-TRIMETHOPRIM 10000-0.1 UNIT/ML-% OP SOLN: 1.0000 [drp] | OPHTHALMIC | 0 refills | Status: DC

## 2017-05-20 NOTE — Progress Notes (Signed)
BP (!) 148/79 (BP Location: Right Arm, Patient Position: Sitting)   Pulse 64   Temp 98.7 F (37.1 C) (Oral)   Wt 237 lb 11.2 oz (107.8 kg)   BMI 42.11 kg/m    Subjective:    Patient ID: Tabitha [name], female    DOB: 1970/05/25, 47 y.o.   MRN: [id number]  HPI: Tabitha [name] is a 47 y.o. female  Chief Complaint  Patient presents with  . Eye Pain    possible conjuctivitis right eye   Pt here with 2 days of thick discharge from right eye along with redness, itching and burning irritation. States her eyesight seems blurry on that side due to significant discharge. Denies eye pain, pressure, headache, vomiting.   Also having rosacea flare on cheeks as she's been out of her doxycycline she takes daily as preventative for quite some time. Has not been trying anything topically for issue. Feels that all the discharge lately from eye is further irritating it.   Past Medical History:  Diagnosis Date  . Allergic rhinitis   . Anxiety   . Asthma   . Bipolar disorder (HCC)   . Depression   . Diabetes mellitus without complication (HCC)   . GERD (gastroesophageal reflux disease)   . Hyperlipidemia   . Mild sleep apnea   . OCD (obsessive compulsive disorder)   . Palpitations   . Paranoia (HCC)   . Schizoaffective disorder (HCC)   . Sleep apnea    Social History   Socioeconomic History  . Marital status: Married    Spouse name: kevin  . Number of children: 2  . Years of education: Not on file  . Highest education level: Bachelor's degree (e.g., BA, AB, BS)  Social Needs  . Financial resource strain: Not hard at all  . Food insecurity - worry: Sometimes true  . Food insecurity - inability: Sometimes true  . Transportation needs - medical: No  . Transportation needs - non-medical: No  Occupational History  . Not on file  Tobacco Use  . Smoking status: Never Smoker  . Smokeless tobacco: Never Used  Substance and Sexual Activity  . Alcohol use: No    Alcohol/week: 0.0  oz  . Drug use: No  . Sexual activity: Yes    Partners: Male    Birth control/protection: None, Surgical  Other Topics Concern  . Not on file  Social History Narrative  . Not on file   Relevant past medical, surgical, family and social history reviewed and updated as indicated. Interim medical history since our last visit reviewed. Allergies and medications reviewed and updated.  Review of Systems  Constitutional: Negative.   HENT: Negative.   Eyes: Positive for discharge, redness, itching and visual disturbance (blurred vision).  Respiratory: Negative.   Cardiovascular: Negative.   Gastrointestinal: Negative.   Musculoskeletal: Negative.   Skin: Positive for rash.  Neurological: Negative.   Psychiatric/Behavioral: Negative.     Per HPI unless specifically indicated above     Objective:    BP (!) 148/79 (BP Location: Right Arm, Patient Position: Sitting)   Pulse 64   Temp 98.7 F (37.1 C) (Oral)   Wt 237 lb 11.2 oz (107.8 kg)   BMI 42.11 kg/m   Wt Readings from Last 3 Encounters:  05/20/17 237 lb 11.2 oz (107.8 kg)  04/23/17 245 lb 9.6 oz (111.4 kg)  03/22/17 244 lb 11.2 oz (111 kg)    Physical Exam  Constitutional: She is oriented to person, place,  and time. She appears well-developed and well-nourished. No distress.  Eyes: EOM are normal. Pupils are equal, round, and reactive to light. Right eye exhibits discharge. Left eye exhibits no discharge.  Right conjunctiva erythematous and injected  Neck: Normal range of motion. Neck supple.  Cardiovascular: Normal rate and normal heart sounds.  Pulmonary/Chest: Effort normal and breath sounds normal. No respiratory distress.  Musculoskeletal: Normal range of motion.  Neurological: She is alert and oriented to person, place, and time. No cranial nerve deficit.  Skin: Skin is warm and dry. Rash (rosacea acne bumps across cheeks, worse on right cheek) noted.  Psychiatric: She has a normal mood and affect. Her behavior is  normal.  Nursing note and vitals reviewed.   Visual Acuity: corrected R - 20/100 L - 20/30   Results for orders placed or performed in visit on 04/23/17  Bayer DCA Hb A1c Waived  Result Value Ref Range   Bayer DCA Hb A1c Waived 8.3 (H) <7.0 %  Basic metabolic panel  Result Value Ref Range   Glucose 211 (H) 65 - 99 mg/dL   BUN 11 6 - 24 mg/dL   Creatinine, Ser 4.09 0.57 - 1.00 mg/dL   GFR calc non Af Amer 109 >59 mL/min/1.73   GFR calc Af Amer 125 >59 mL/min/1.73   BUN/Creatinine Ratio 17 9 - 23   Sodium 137 134 - 144 mmol/L   Potassium 4.4 3.5 - 5.2 mmol/L   Chloride 98 96 - 106 mmol/L   CO2 24 20 - 29 mmol/L   Calcium 9.5 8.7 - 10.2 mg/dL  LP+ALT+AST Piccolo, Waived  Result Value Ref Range   ALT (SGPT) Piccolo, Waived 35 10 - 47 U/L   AST (SGOT) Piccolo, Waived 24 11 - 38 U/L   Cholesterol Piccolo, Waived 176 <200 mg/dL   HDL Chol Piccolo, Waived 51 (L) >59 mg/dL   Triglycerides Piccolo,Waived 253 (H) <150 mg/dL   Chol/HDL Ratio Piccolo,Waive 3.5 mg/dL   LDL Chol Calc Piccolo Waived 74 <100 mg/dL   VLDL Chol Calc Piccolo,Waive 51 (H) <30 mg/dL      Assessment & Plan:   Problem List Items Addressed This Visit      Musculoskeletal and Integument   Acne rosacea    Will renew daily doxycycline for now, will discuss topical options once back under control in attempts to avoid daily abx for long time frames      Relevant Medications   doxycycline (VIBRAMYCIN) 50 MG capsule    Other Visit Diagnoses    Acute bacterial conjunctivitis of right eye    -  Primary   Will treat with warm compresses and polytrim drops. Return precautions reviewed at length. Good hand hygiene discussed.        Follow up plan: Return for as scheduled.

## 2017-05-23 ENCOUNTER — Encounter: Payer: Self-pay | Admitting: Family Medicine

## 2017-05-23 NOTE — Assessment & Plan Note (Signed)
Will renew daily doxycycline for now, will discuss topical options once back under control in attempts to avoid daily abx for long time frames

## 2017-05-23 NOTE — Patient Instructions (Signed)
Follow up as scheduled.  

## 2017-05-25 ENCOUNTER — Ambulatory Visit
Admission: RE | Admit: 2017-05-25 | Discharge: 2017-05-25 | Disposition: A | Discharge status: Home / Self Care | Payer: Medicare Other | Source: Ambulatory Visit | Attending: Psychiatry | Admitting: Psychiatry

## 2017-05-25 ENCOUNTER — Other Ambulatory Visit: Payer: Self-pay | Admitting: Psychiatry

## 2017-05-25 DIAGNOSIS — I517 Cardiomegaly: Secondary | ICD-10-CM | POA: Diagnosis not present

## 2017-05-25 DIAGNOSIS — Z79899 Other long term (current) drug therapy: Secondary | ICD-10-CM | POA: Diagnosis present

## 2017-05-25 NOTE — Telephone Encounter (Signed)
Thanks

## 2017-05-25 NOTE — Telephone Encounter (Signed)
patient was called and left another message about if she had went to have ekg done yet because we have not received yet.

## 2017-06-01 ENCOUNTER — Other Ambulatory Visit: Payer: Self-pay | Admitting: Psychiatry

## 2017-06-01 ENCOUNTER — Ambulatory Visit (INDEPENDENT_AMBULATORY_CARE_PROVIDER_SITE_OTHER): Payer: Medicare Other | Admitting: Family Medicine

## 2017-06-01 ENCOUNTER — Encounter: Payer: Self-pay | Admitting: Family Medicine

## 2017-06-01 VITALS — BP 111/72 | HR 78 | Temp 97.8°F | Wt 236.5 lb

## 2017-06-01 DIAGNOSIS — H1031 Unspecified acute conjunctivitis, right eye: Secondary | ICD-10-CM

## 2017-06-01 MED ORDER — CIPROFLOXACIN HCL 0.3 % OP SOLN: 1.0000 [drp] | OPHTHALMIC | 0 refills | Status: DC

## 2017-06-01 NOTE — Progress Notes (Signed)
BP 111/72 (BP Location: Left Arm, Patient Position: Sitting, Cuff Size: Normal)   Pulse 78   Temp 97.8 F (36.6 C) (Oral)   Wt 236 lb 8 oz (107.3 kg)   BMI 41.89 kg/m    Subjective:    Patient ID: Tabitha [name], female    DOB: [birth year], 47 y.o.   MRN: [id number]  HPI: Tabitha [name] is a 47 y.o. female  Chief Complaint  Patient presents with  . Conjunctivitis   Pt here today with recurrent right eye conjunctivitis. Improved some with the polytrim drops, but once she ran out a few days ago sxs returned. Eye is red, itchy, draining. Visual acuity is consistent with last visit. Has not been trying compresses. Left eye has had no issues. Denies headaches, facial pain, fevers, N/V, visual loss.   Relevant past medical, surgical, family and social history reviewed and updated as indicated. Interim medical history since our last visit reviewed. Allergies and medications reviewed and updated.  Review of Systems  Constitutional: Negative.   HENT: Negative.   Eyes: Positive for discharge, redness and itching.  Respiratory: Negative.   Cardiovascular: Negative.   Gastrointestinal: Negative.   Musculoskeletal: Negative.   Skin: Negative.   Neurological: Negative.   Psychiatric/Behavioral: Negative.     Per HPI unless specifically indicated above     Objective:    BP 111/72 (BP Location: Left Arm, Patient Position: Sitting, Cuff Size: Normal)   Pulse 78   Temp 97.8 F (36.6 C) (Oral)   Wt 236 lb 8 oz (107.3 kg)   BMI 41.89 kg/m   Wt Readings from Last 3 Encounters:  06/01/17 236 lb 8 oz (107.3 kg)  05/20/17 237 lb 11.2 oz (107.8 kg)  04/23/17 245 lb 9.6 oz (111.4 kg)    Physical Exam  Constitutional: She is oriented to person, place, and time. She appears well-developed and well-nourished. No distress.  HENT:  Head: Atraumatic.  Eyes: EOM are normal. Pupils are equal, round, and reactive to light. Right eye exhibits discharge.  Right conjunctiva erythematous  and injected Orbits nontender to palpation b/l  Neck: Normal range of motion. Neck supple.  Cardiovascular: Normal rate and normal heart sounds.  Pulmonary/Chest: Effort normal and breath sounds normal. No respiratory distress.  Musculoskeletal: Normal range of motion.  Neurological: She is alert and oriented to person, place, and time.  Skin: Skin is warm and dry.  Psychiatric: She has a normal mood and affect. Her behavior is normal.  Nursing note and vitals reviewed.  Visual Acuity:  R - 20/100 L - 20/30  Results for orders placed or performed in visit on 04/23/17  Bayer DCA Hb A1c Waived  Result Value Ref Range   Bayer DCA Hb A1c Waived 8.3 (H) <7.0 %  Basic metabolic panel  Result Value Ref Range   Glucose 211 (H) 65 - 99 mg/dL   BUN 11 6 - 24 mg/dL   Creatinine, Ser 5.780.63 0.57 - 1.00 mg/dL   GFR calc non Af Amer 109 >59 mL/min/1.73   GFR calc Af Amer 125 >59 mL/min/1.73   BUN/Creatinine Ratio 17 9 - 23   Sodium 137 134 - 144 mmol/L   Potassium 4.4 3.5 - 5.2 mmol/L   Chloride 98 96 - 106 mmol/L   CO2 24 20 - 29 mmol/L   Calcium 9.5 8.7 - 10.2 mg/dL  LP+ALT+AST Piccolo, Waived  Result Value Ref Range   ALT (SGPT) Piccolo, Waived 35 10 - 47 U/L   AST (  SGOT) Piccolo, Waived 24 11 - 38 U/L   Cholesterol Piccolo, Waived 176 <200 mg/dL   HDL Chol Piccolo, Waived 51 (L) >59 mg/dL   Triglycerides Piccolo,Waived 253 (H) <150 mg/dL   Chol/HDL Ratio Piccolo,Waive 3.5 mg/dL   LDL Chol Calc Piccolo Waived 74 <100 mg/dL   VLDL Chol Calc Piccolo,Waive 51 (H) <30 mg/dL      Assessment & Plan:   Problem List Items Addressed This Visit    None    Visit Diagnoses    Acute bacterial conjunctivitis of right eye    -  Primary   Afebrile, vision stable. Switch to cipro drops, warm compresses, good hand hygiene. If no improvement or worsening sxs, ER or Opthalmologist urgently.        Follow up plan: Return if symptoms worsen or fail to improve.

## 2017-06-02 LAB — COMPREHENSIVE METABOLIC PANEL
A/G RATIO: 2 (ref 1.2–2.2)
ALBUMIN: 4.5 g/dL (ref 3.5–5.5)
ALK PHOS: 87 IU/L (ref 43–101)
ALT: 28 IU/L (ref 0–32)
AST: 24 IU/L (ref 0–40)
BUN / CREAT RATIO: 14 (ref 9–23)
BUN: 13 mg/dL (ref 6–20)
Bilirubin Total: 0.3 mg/dL (ref 0.0–1.2)
CO2: 22 mmol/L (ref 20–29)
CREATININE: 0.9 mg/dL (ref 0.57–1.00)
Calcium: 9.7 mg/dL (ref 8.7–10.2)
Chloride: 96 mmol/L (ref 96–106)
GFR calc Af Amer: 108 mL/min/{1.73_m2} (ref >59)
GFR, EST NON AFRICAN AMERICAN: 94 mL/min/{1.73_m2} (ref >59)
GLOBULIN, TOTAL: 2.3 g/dL (ref 1.5–4.5)
Glucose: 180 mg/dL — ABNORMAL HIGH (ref 65–99)
POTASSIUM: 4.4 mmol/L (ref 3.5–5.2)
SODIUM: 138 mmol/L (ref 134–144)
Total Protein: 6.8 g/dL (ref 6.0–8.5)

## 2017-06-02 LAB — CBC WITH DIFFERENTIAL/PLATELET
BASOS: 0 %
Basophils Absolute: 0 10*3/uL (ref 0.0–0.2)
EOS (ABSOLUTE): 0.1 10*3/uL (ref 0.0–0.4)
EOS: 2 %
HEMATOCRIT: 32 % — AB (ref 34.0–46.6)
HEMOGLOBIN: 9.8 g/dL — AB (ref 11.1–15.9)
Immature Grans (Abs): 0 10*3/uL (ref 0.0–0.1)
Immature Granulocytes: 0 %
LYMPHS ABS: 2.2 10*3/uL (ref 0.7–3.1)
Lymphs: 40 %
MCH: 22.6 pg — ABNORMAL LOW (ref 26.6–33.0)
MCHC: 30.6 g/dL — AB (ref 31.5–35.7)
MCV: 74 fL — AB (ref 79–97)
MONOCYTES: 8 %
MONOS ABS: 0.4 10*3/uL (ref 0.1–0.9)
NEUTROS ABS: 2.8 10*3/uL (ref 1.4–7.0)
Neutrophils: 50 %
Platelets: 246 10*3/uL (ref 150–379)
RBC: 4.33 x10E6/uL (ref 3.77–5.28)
RDW: 19.7 % — ABNORMAL HIGH (ref 12.3–15.4)
WBC: 5.5 10*3/uL (ref 3.4–10.8)

## 2017-06-02 LAB — VALPROIC ACID LEVEL: VALPROIC ACID LVL: 43 ug/mL — AB (ref 50–100)

## 2017-06-02 LAB — PROLACTIN: Prolactin: 84.8 ng/mL — ABNORMAL HIGH (ref 4.8–23.3)

## 2017-06-02 LAB — B12 AND FOLATE PANEL
Folate: 6.5 ng/mL (ref >3.0)
VITAMIN B 12: 578 pg/mL (ref 232–1245)

## 2017-06-02 LAB — VITAMIN D 25 HYDROXY (VIT D DEFICIENCY, FRACTURES): VIT D 25 HYDROXY: 34 ng/mL (ref 30.0–100.0)

## 2017-06-02 LAB — TSH: TSH: 2.57 u[IU]/mL (ref 0.450–4.500)

## 2017-06-03 ENCOUNTER — Other Ambulatory Visit: Payer: Self-pay | Admitting: Family Medicine

## 2017-06-03 DIAGNOSIS — E119 Type 2 diabetes mellitus without complications: Secondary | ICD-10-CM

## 2017-06-03 NOTE — Patient Instructions (Signed)
Follow up as needed

## 2017-06-04 ENCOUNTER — Telehealth: Payer: Self-pay

## 2017-06-04 ENCOUNTER — Other Ambulatory Visit: Payer: Self-pay | Admitting: Psychiatry

## 2017-06-04 NOTE — Telephone Encounter (Signed)
Reviewed the labs  Depakote level - 43 ug/ml ( subtherapeutic) - will not make changes at this time. Vitamin b12 - wnl- 578 Folate - wnl- 6.5 TSH - wnl- 2.570 Vitamin d- wnl- 34.0 Prolactin - 84.8 ng/ml ( high) - will monitor closely. Hb - low at 9.8  HCT - low at 32.0 Platelets - wnl cmp - wnl AST/ALT - WNL.  Will ask pt to follow up with PMD for her Low Hb/hct.

## 2017-06-04 NOTE — Telephone Encounter (Signed)
faxed and confirmed per dr. Elna Bresloweappen order. to fax the labwork to patient pcp.

## 2017-06-14 ENCOUNTER — Telehealth: Payer: Self-pay | Admitting: Family Medicine

## 2017-06-14 ENCOUNTER — Telehealth: Payer: Self-pay | Admitting: Psychiatry

## 2017-06-14 NOTE — Telephone Encounter (Signed)
Attempted to contact patient to discuss lab results. Will ask her to follow up with her PMD . Will not make changes to her depakote now, will see her in clinic next appointment to discuss.

## 2017-06-14 NOTE — Telephone Encounter (Signed)
Pt can start taking OTC iron supplements as it looks like iron-deficiency. Can also start increasing dietary intake of iron rich foods such as leafy greens, meats, etc. We can recheck levels at next appt

## 2017-06-14 NOTE — Telephone Encounter (Signed)
Routing to provider  

## 2017-06-14 NOTE — Telephone Encounter (Signed)
Copied from CRM [phone]. Topic: Quick Communication - See Telephone Encounter >> Jun 14, 2017  2:47 PM Tabitha [name], Tabitha [name] wrote: CRM for notification. See Telephone encounter for: 06/14/17.  Pt states that Dr. Elna BreslowEappen, psychiatrist, advised her of anemia and that labs would be sent to Roosvelt Maserachel Lane, PA to review. Pt asking if she needs appt or to begin RX for anemia. Please advise.

## 2017-06-15 ENCOUNTER — Ambulatory Visit (INDEPENDENT_AMBULATORY_CARE_PROVIDER_SITE_OTHER): Payer: Medicare Other

## 2017-06-15 ENCOUNTER — Encounter: Payer: Self-pay | Admitting: Psychiatry

## 2017-06-15 ENCOUNTER — Other Ambulatory Visit: Payer: Self-pay

## 2017-06-15 ENCOUNTER — Ambulatory Visit: Payer: Medicare Other | Admitting: Psychiatry

## 2017-06-15 ENCOUNTER — Telehealth: Payer: Self-pay | Admitting: Family Medicine

## 2017-06-15 VITALS — BP 112/72 | HR 93 | Temp 98.6°F | Wt 240.6 lb

## 2017-06-15 DIAGNOSIS — G4733 Obstructive sleep apnea (adult) (pediatric): Secondary | ICD-10-CM | POA: Diagnosis not present

## 2017-06-15 DIAGNOSIS — Z9989 Dependence on other enabling machines and devices: Secondary | ICD-10-CM

## 2017-06-15 DIAGNOSIS — F251 Schizoaffective disorder, depressive type: Secondary | ICD-10-CM

## 2017-06-15 DIAGNOSIS — F429 Obsessive-compulsive disorder, unspecified: Secondary | ICD-10-CM

## 2017-06-15 DIAGNOSIS — F3170 Bipolar disorder, currently in remission, most recent episode unspecified: Secondary | ICD-10-CM

## 2017-06-15 DIAGNOSIS — F250 Schizoaffective disorder, bipolar type: Secondary | ICD-10-CM

## 2017-06-15 DIAGNOSIS — F3130 Bipolar disorder, current episode depressed, mild or moderate severity, unspecified: Secondary | ICD-10-CM

## 2017-06-15 DIAGNOSIS — F259 Schizoaffective disorder, unspecified: Secondary | ICD-10-CM

## 2017-06-15 MED ORDER — OLANZAPINE 5 MG PO TABS: 5.0000 mg | ORAL_TABLET | Freq: Every day | ORAL | 0 refills | Status: DC

## 2017-06-15 MED ORDER — DIVALPROEX SODIUM 250 MG PO DR TAB: 250.0000 mg | DELAYED_RELEASE_TABLET | Freq: Every morning | ORAL | 0 refills | Status: DC

## 2017-06-15 MED ORDER — BENZTROPINE MESYLATE 1 MG PO TABS: 1.0000 mg | ORAL_TABLET | Freq: Every day | ORAL | 0 refills | Status: DC

## 2017-06-15 MED ORDER — PALIPERIDONE PALMITATE 156 MG/ML IM SUSP
156.0000 mg | Freq: Once | INTRAMUSCULAR | Status: AC
  Administered 2017-06-15: 156 mg via INTRAMUSCULAR

## 2017-06-15 MED ORDER — PALIPERIDONE PALMITATE 156 MG/ML IM SUSP: 156.0000 mg | INTRAMUSCULAR | 1 refills | Status: DC

## 2017-06-15 MED ORDER — DIVALPROEX SODIUM 500 MG PO DR TAB: 500.0000 mg | DELAYED_RELEASE_TABLET | Freq: Every evening | ORAL | 0 refills | Status: DC

## 2017-06-15 NOTE — Progress Notes (Signed)
BH MD OP Progress Note  06/15/2017 2:20 PM Tabitha [name]  MRN:  [id number]  Chief Complaint: ' I am ok."  Chief Complaint    Follow-up; Medication Refill     HPI: Tabitha [name] is a 47 year old married, Caucasian female, who is on SSD, lives in [place], presented to the clinic today for a follow-up visit.  Patient has a history of schizoaffective disorder as well as unspecified OCD symptoms.  Pt today presents for her follow-up as well as her monthly [place] injection.  She reports she is compliant on her medications and is tolerating them well.  She reports she started the Zyprexa as prescribed which was added last visit.  She reports that has helped with her paranoid and mood symptoms.  She continues to struggle with chronic paranoia and delusions which are always connected to the church.  She reports last Sunday she almost did not go to church but was able to cope with it and still make it.  She continues to have fears that there are cameras installed in the church and they are watching her and so on.  She however reports that Zyprexa works along with the [employer] and has been helping.  Discussed with her about her Depakote level which was collected recently.  Her Depakote level was subtherapeutic at 43 mcg/mL on  06/01/2017.  Discussed with her that her Depakote can be readjusted today if she continues to have severe mood lability.  She however reports she is okay with the current dose at this time.  We will continue to monitor closely.  She denies any other concerns today.  She reports sleep is okay.  She reports she had a good anniversary party as well as Valentine's Day last week.  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     Visit Diagnosis:    ICD-10-CM   1. Schizoaffective disorder, depressive type (HCC) F25.1 OLANZapine (ZYPREXA) 5 MG tablet    divalproex (DEPAKOTE) 250 MG DR tablet    divalproex (DEPAKOTE) 500 MG DR tablet    paliperidone (INVEGA SUSTENNA)  injection 156 mg    paliperidone (INVEGA SUSTENNA) 156 MG/ML SUSP injection    benztropine (COGENTIN) 1 MG tablet  2. Obsessive-compulsive disorder, unspecified type F42.9   3. OSA on CPAP G47.33    Z99.89     Past Psychiatric History: History of schizoaffective disorder, OCD.  She used to follow up with [facility] [place] in the past and then with Dr. Garnetta Buddy.  She denies any suicide attempts.  Denies any inpatient mental health admissions.  Past Medical History:  Past Medical History:  Diagnosis Date  . Allergic rhinitis   . Anxiety   . Asthma   . Bipolar disorder (HCC)   . Depression   . Diabetes mellitus without complication (HCC)   . GERD (gastroesophageal reflux disease)   . Hyperlipidemia   . Mild sleep apnea   . OCD (obsessive compulsive disorder)   . Palpitations   . Paranoia (HCC)   . Schizoaffective disorder (HCC)   . Sleep apnea     Past Surgical History:  Procedure Laterality Date  . CESAREAN SECTION  2006/2008  . CHOLECYSTECTOMY  2006    Family Psychiatric History: Denies.  Family History:  Family History  Problem Relation Age of Onset  . Diabetes Father   . Hyperlipidemia Father   . Diabetes Brother   . Lupus Mother   . Diabetes Mother        pre diabetes  Substance abuse history: Denies   Social History: Married, lives with her husband.  She is on SSD.  Her husband is also disabled.  He is a double amputee, from a past history of abdominal gangrene which spread.  She has 2 children aged 92 and 38 years old.  They are home schooled at this time. Social History   Socioeconomic History  . Marital status: Married    Spouse name: kevin  . Number of children: 2  . Years of education: None  . Highest education level: Bachelor's degree (e.g., BA, AB, BS)  Social Needs  . Financial resource strain: Not hard at all  . Food insecurity - worry: Sometimes true  . Food insecurity - inability: Sometimes true  . Transportation needs - medical: No   . Transportation needs - non-medical: No  Occupational History  . None  Tobacco Use  . Smoking status: Never Smoker  . Smokeless tobacco: Never Used  Substance and Sexual Activity  . Alcohol use: No    Alcohol/week: 0.0 oz  . Drug use: No  . Sexual activity: Yes    Partners: Male    Birth control/protection: None, Surgical  Other Topics Concern  . None  Social History Narrative  . None    Allergies:  Allergies  Allergen Reactions  . Abilify [Aripiprazole] Hives    Metabolic Disorder Labs: Lab Results  Component Value Date   HGBA1C 6.6 (H) 06/18/2016   Lab Results  Component Value Date   PROLACTIN 84.8 (H) 06/01/2017   PROLACTIN 36.2 (H) 03/26/2016   Lab Results  Component Value Date   CHOL 176 04/23/2017   TRIG 253 (H) 04/23/2017   HDL 48 03/26/2016   CHOLHDL 2.9 09/26/2015   VLDL 51 (H) 04/23/2017   LDLCALC 63 03/26/2016   LDLCALC 31 09/26/2015   Lab Results  Component Value Date   TSH 2.570 06/01/2017   TSH 2.500 03/26/2016    Therapeutic Level Labs: No results found for: LITHIUM Lab Results  Component Value Date   VALPROATE 43 (L) 06/01/2017   No components found for:  CBMZ  Current Medications: Current Outpatient Medications  Medication Sig Dispense Refill  . albuterol (PROAIR HFA) 108 (90 Base) MCG/ACT inhaler Inhale 2 puffs into the lungs every 6 (six) hours as needed for wheezing or shortness of breath. 8.5 g 6  . atorvastatin (LIPITOR) 20 MG tablet TAKE 1 TABLET BY MOUTH EVERY NIGHT AT BEDTIME 30 tablet 12  . benztropine (COGENTIN) 1 MG tablet Take 1 tablet (1 mg total) by mouth at bedtime. 90 tablet 0  . ciprofloxacin (CILOXAN) 0.3 % ophthalmic solution Place 1 drop into the right eye every 4 (four) hours while awake. 10 mL 0  . divalproex (DEPAKOTE) 250 MG DR tablet Take 1 tablet (250 mg total) by mouth every morning. 90 tablet 0  . divalproex (DEPAKOTE) 500 MG DR tablet Take 1 tablet (500 mg total) by mouth every evening. 90 tablet 0  .  doxycycline (VIBRAMYCIN) 50 MG capsule Take 1 capsule (50 mg total) by mouth every morning. 30 capsule 6  . empagliflozin (JARDIANCE) 10 MG TABS tablet Take 10 mg by mouth daily. 30 tablet 2  . fluticasone (FLOVENT HFA) 110 MCG/ACT inhaler Inhale 1 puff into the lungs 2 (two) times daily. 1 Inhaler 11  . gabapentin (NEURONTIN) 400 MG capsule Take 1 capsule (400 mg total) by mouth 3 (three) times daily. 270 capsule 0  . glucose blood (ACCU-CHEK AVIVA PLUS) test strip 1 each by  Other route as needed for other. Use as instructed 100 each 12  . hydrochlorothiazide (HYDRODIURIL) 12.5 MG tablet TAKE 1 TABLET(12.5 MG) BY MOUTH DAILY 90 tablet 1  . Insulin Detemir (LEVEMIR) 100 UNIT/ML Pen Inject 40 Units into the skin 2 (two) times daily. 5 pen 11  . Insulin Pen Needle (BD PEN NEEDLE NANO U/F) 32G X 4 MM MISC 1 each by Does not apply route daily. 100 each 5  . metFORMIN (GLUCOPHAGE) 500 MG tablet TAKE 2 TABLETS BY MOUTH TWICE DAILY BEFORE A MEAL 180 tablet 0  . NOVOTWIST 32G X 5 MM MISC USE WITH LEVEMIR PEN BID  5  . OLANZapine (ZYPREXA) 5 MG tablet Take 1 tablet (5 mg total) by mouth at bedtime. 90 tablet 0  . omeprazole (PRILOSEC) 10 MG capsule TAKE 1 CAPSULE BY MOUTH EVERY DAY 30 capsule 5  . paliperidone (INVEGA SUSTENNA) 156 MG/ML SUSP injection Inject 1 mL (156 mg total) into the muscle every 28 (twenty-eight) days. Next dose in 28 days -07/13/2017 1 mL 1  . pioglitazone (ACTOS) 45 MG tablet Take 1 tablet (45 mg total) by mouth daily. 30 tablet 12  . trimethoprim-polymyxin b (POLYTRIM) ophthalmic solution Place 1 drop into the right eye every 4 (four) hours. 10 mL 0  . Vilazodone HCl (VIIBRYD) 40 MG TABS Take 1 tablet (40 mg total) by mouth every morning. 90 tablet 0   Current Facility-Administered Medications  Medication Dose Route Frequency Provider Last Rate Last Dose  . paliperidone (INVEGA SUSTENNA) injection 156 mg  156 mg Intramuscular Once Jomarie Longs, MD          Musculoskeletal: Strength & Muscle Tone: within normal limits Gait & Station: normal Patient leans: N/A  Psychiatric Specialty Exam: Review of Systems  Psychiatric/Behavioral: The patient is nervous/anxious.   All other systems reviewed and are negative.   Blood pressure 112/72, pulse 93, temperature 98.6 F (37 C), temperature source Oral, weight 240 lb 9.6 oz (109.1 kg).Body mass index is 42.62 kg/m.  General Appearance: Casual  Eye Contact:  Fair  Speech:  Normal Rate  Volume:  Normal  Mood:  Anxious  Affect:  Congruent  Thought Process:  Goal Directed and Descriptions of Associations: Intact  Orientation:  Full (Time, Place, and Person)  Thought Content: Delusions and Paranoid Ideation , chronic , on and off, able to cope  Suicidal Thoughts:  No  Homicidal Thoughts:  No  Memory:  Immediate;   Fair Recent;   Fair Remote;   Fair  Judgement:  Fair  Insight:  Fair  Psychomotor Activity:  Normal  Concentration:  Concentration: Fair and Attention Span: Fair  Recall:  [employer] of Knowledge: Fair  Language: Fair  Akathisia:  No  Handed:  Right  AIMS (if indicated): 0  Assets:  Communication Skills Desire for Improvement Housing Intimacy Social Support Talents/Skills  ADL's:  Intact  Cognition: WNL  Sleep:  Fair   Screenings: AIMS     Office Visit from [facility]/12/2017 in [facility] Office Visit from 07/29/2016 in [facility] Office Visit from 05/06/2016 in [facility] Office Visit from 04/01/2016 in [facility] Office Visit from 03/28/2015 in [facility]  AIMS Total Score  0  0  0  0  0    PHQ2-9     Clinical Support from 01/13/2017 in [facility] Office Visit from 09/24/2015 in [facility] Office Visit from 06/20/2015 in [facility] Office Visit from [facility]/15/2015 in [facility] Office Visit from 01/08/2015 in [facility]  PHQ-2 Total Score  2  3  0  0  4  PHQ-9 Total Score  12  8  No data  No data  12       Assessment and Plan: Tabitha [name] is a 47 year old Caucasian female who has a history of schizoaffective disorder, anxiety disorder, insomnia, OSA on CPAP who presented to the clinic today for a follow-up visit.  Tabitha [name] continues to have some anxiety provoking delusional thoughts, paranoia, however she is able to cope with it and feels her new antipsychotic medication, Zyprexa has been helpful.  She continues to be compliant with her monthly Gean Birchwoodnvega Sustenna injection which was reduced to 156 mg.  Discussed with her about pursuing psychotherapy again.  She however continues to decline.  We will continue plan as noted below.  Plan For schizoaffective disorder Invega sustenna 156 mg IM q. 28 days.  She missed her appointment on 06/09/2017 since she where away.  She will get it today.  She will get her next one on 07/13/2017. Continue Zyprexa 5 mg p.o. nightly Continue Cogentin 1 mg p.o. nightly for EPS Continue Depakote 750 mg p.o. daily in divided doses. Depakote level-43 on 06/01/2017-subtherapeutic.  Patient however wants to stay on this dose at this time.  For anxiety symptoms Continue Viibryd 40 mg p.o. daily Continue gabapentin 400 mg p.o. 3 times daily.  She also takes it for neuropathy.  For insomnia  she will continue melatonin.   Zyprexa will also help with her sleep. OSA-on CPAP.  Discussed the importance of staying compliant with her CPAP.  Reviewed labs-TSH-within normal limits, hemoglobin A1c-within normal limits, prolactin-84.8-(elevated we will continue to monitor), Depakote level-43-subtherapeutic, vitamin B12-within normal limits, vitamin D-within normal limits, folate--within normal limits, CMP--within normal limits, CBC with differentia-normal-hemoglobin low.  Her PMD is currently managing her low  hemoglobin.  Follow up in clinic in 4 weeks or sooner if needed.  More than 50 % of the time was spent for psychoeducation and supportive psychotherapy and care coordination.  This note was generated in part or whole with voice recognition software. Voice recognition is usually quite accurate but there are transcription errors that can and very often do occur. I apologize for any typographical errors that were not detected and corrected.        Jomarie LongsSaramma Aubrynn Katona, MD 06/15/2017, 2:20 PM

## 2017-06-15 NOTE — Telephone Encounter (Signed)
Called patient no answer. LVM on patient's personalized VM (DPR reviewed) of what Fleet ContrasRachel noted.  Explained to call if she had anymore questions.

## 2017-06-15 NOTE — Telephone Encounter (Signed)
This was faxed and confirmed.  

## 2017-06-15 NOTE — Progress Notes (Signed)
left [place] invega 156mg  injection idc # [id number] .  s/n # [id number] exp [id number]  lot # [id number] pt states that she did is tollerating well. she has some issues from time to time but she works threw them.

## 2017-06-15 NOTE — Telephone Encounter (Signed)
Pt called in and was read what Tabitha Cascoachael [name] had put in her note regarding the iron deficiency.    Pt instructed to take an OTC iron supplement, and to increase her intake of leafy greens and meats in her diet.   Her levels will be rechecked at her next appt.  Pt had no further questions.

## 2017-06-16 ENCOUNTER — Encounter: Payer: Self-pay | Admitting: Psychiatry

## 2017-07-13 ENCOUNTER — Ambulatory Visit: Payer: Medicare Other

## 2017-07-13 ENCOUNTER — Ambulatory Visit: Payer: Medicare Other | Admitting: Psychiatry

## 2017-07-15 ENCOUNTER — Other Ambulatory Visit: Payer: Self-pay | Admitting: Family Medicine

## 2017-07-15 DIAGNOSIS — E119 Type 2 diabetes mellitus without complications: Secondary | ICD-10-CM

## 2017-07-15 NOTE — Telephone Encounter (Signed)
Metformin refill Last OV: 04/23/17 Last Refill:06/04/17 #180 0 RF Pharmacy:[facility] [address] Last Hgb A1 C 04/23/17

## 2017-07-16 ENCOUNTER — Telehealth: Payer: Self-pay | Admitting: Family Medicine

## 2017-07-16 DIAGNOSIS — E119 Type 2 diabetes mellitus without complications: Secondary | ICD-10-CM

## 2017-07-16 DIAGNOSIS — Z794 Long term (current) use of insulin: Secondary | ICD-10-CM

## 2017-07-16 DIAGNOSIS — E0800 Diabetes mellitus due to underlying condition with hyperosmolarity without nonketotic hyperglycemic-hyperosmolar coma (NKHHC): Secondary | ICD-10-CM

## 2017-07-16 NOTE — Telephone Encounter (Signed)
Copied from CRM [phone]. Topic: Quick Communication - Rx Refill/Question >> Jul 16, 2017  5:36 PM Raquel SarnaHayes, Teresa G wrote: Insulin Detemir (LEVEMIR) 100 UNIT/ML Pen   [facility] [id number] [place] - [address] [facility] [address] [place] [address] Phone: [phone] Fax: [phone]

## 2017-07-16 NOTE — Telephone Encounter (Signed)
Request for refill of Levemir Pen. Previous refill not done by this provider.   LOV: 04/23/17  Margit Hanksachel Lane,PA  [facility] [address]     [place]

## 2017-07-18 ENCOUNTER — Other Ambulatory Visit: Payer: Self-pay | Admitting: Family Medicine

## 2017-07-19 MED ORDER — INSULIN DETEMIR 100 UNIT/ML FLEXPEN: 40.0000 [IU] | PEN_INJECTOR | Freq: Two times a day (BID) | SUBCUTANEOUS | 11 refills | Status: DC

## 2017-07-19 NOTE — Telephone Encounter (Signed)
Rx sent 

## 2017-07-20 ENCOUNTER — Telehealth: Payer: Self-pay | Admitting: Family Medicine

## 2017-07-20 DIAGNOSIS — I10 Essential (primary) hypertension: Secondary | ICD-10-CM

## 2017-07-20 NOTE — Telephone Encounter (Signed)
Copied from CRM [phone]. Topic: Quick Communication - See Telephone Encounter >> Jul 20, 2017  3:14 PM Windy KalataMichael, Selden Noteboom L, NT wrote: CRM for notification. See Telephone encounter for: 07/20/17.  Patient is needing a refill on atorvastatin (LIPITOR) 20 MG tablet  Please advise  [facility] [id number] [place] - [address] [facility] [address]  [address] [place] [id number]  Phone: [phone] Fax: [phone]

## 2017-07-20 NOTE — Telephone Encounter (Signed)
Request for refill of Atorvastatin. Medication not previously filled by Roosvelt Maserachel Lane, PA.    LOV:04/23/17  Margit Hanksachel Lane,PA  [facility] [place]       [address]

## 2017-07-21 MED ORDER — ATORVASTATIN CALCIUM 20 MG PO TABS: 20.0000 mg | ORAL_TABLET | Freq: Every day | ORAL | 1 refills | Status: DC

## 2017-07-21 NOTE — Telephone Encounter (Signed)
Rx sent 

## 2017-07-22 ENCOUNTER — Ambulatory Visit: Payer: Medicare Other | Admitting: Family Medicine

## 2017-07-22 ENCOUNTER — Encounter: Payer: Self-pay | Admitting: Family Medicine

## 2017-07-22 VITALS — BP 109/73 | HR 87 | Temp 97.8°F | Wt 239.6 lb

## 2017-07-22 DIAGNOSIS — E119 Type 2 diabetes mellitus without complications: Secondary | ICD-10-CM | POA: Diagnosis not present

## 2017-07-22 DIAGNOSIS — D509 Iron deficiency anemia, unspecified: Secondary | ICD-10-CM | POA: Diagnosis not present

## 2017-07-22 MED ORDER — EMPAGLIFLOZIN 25 MG PO TABS: 25.0000 mg | ORAL_TABLET | Freq: Every day | ORAL | 2 refills | Status: DC

## 2017-07-22 NOTE — Progress Notes (Signed)
BP 109/73 (BP Location: Left Arm, Patient Position: Sitting, Cuff Size: Large)   Pulse 87   Temp 97.8 F (36.6 C) (Oral)   Wt 239 lb 9 oz (108.7 kg)   SpO2 97%   BMI 42.44 kg/m    Subjective:    Patient ID: Tabitha [name], female    DOB: May 10, 1970, 47 y.o.   MRN: [id number]  HPI: Tabitha [name] is a 47 y.o. female  Chief Complaint  Patient presents with  . Diabetes    follow-up   Pt here today for 3 month diabetes f/u after adding jardiance to curent regimen of actos, metformin, levemir 40 units BID. Has been trying to improve diet but feels she could continue to improve. Home BS's have been in the mid 100s -low 200s per her log.No low blood sugar spells noted. Denies side effects to her medicines, blurred vision, neuropathic sxs. Last A1C was 8.3.   Also wanting CBC rechecked as she was found to be anemic during a lab panel from Psychiatry recently. Has started OTC iron supplementation for this. Denies CP, SOB, fatigue, pallor.   Past Medical History:  Diagnosis Date  . Allergic rhinitis   . Anxiety   . Asthma   . Bipolar disorder (HCC)   . Depression   . Diabetes mellitus without complication (HCC)   . GERD (gastroesophageal reflux disease)   . Hyperlipidemia   . Mild sleep apnea   . OCD (obsessive compulsive disorder)   . Palpitations   . Paranoia (HCC)   . Schizoaffective disorder (HCC)   . Sleep apnea    Social History   Socioeconomic History  . Marital status: Married    Spouse name: kevin  . Number of children: 2  . Years of education: Not on file  . Highest education level: Bachelor's degree (e.g., BA, AB, BS)  Occupational History  . Not on file  Social Needs  . Financial resource strain: Not hard at all  . Food insecurity:    Worry: Sometimes true    Inability: Sometimes true  . Transportation needs:    Medical: No    Non-medical: No  Tobacco Use  . Smoking status: Never Smoker  . Smokeless tobacco: Never Used  Substance and Sexual  Activity  . Alcohol use: No    Alcohol/week: 0.0 oz  . Drug use: No  . Sexual activity: Yes    Partners: Male    Birth control/protection: None, Surgical  Lifestyle  . Physical activity:    Days per week: 0 days    Minutes per session: 0 min  . Stress: To some extent  Relationships  . Social connections:    Talks on phone: More than three times a week    Gets together: More than three times a week    Attends religious service: More than 4 times per year    Active member of club or organization: Yes    Attends meetings of clubs or organizations: More than 4 times per year    Relationship status: Married  . Intimate partner violence:    Fear of current or ex partner: No    Emotionally abused: No    Physically abused: No    Forced sexual activity: No  Other Topics Concern  . Not on file  Social History Narrative  . Not on file   Relevant past medical, surgical, family and social history reviewed and updated as indicated. Interim medical history since our last visit reviewed. Allergies and medications  reviewed and updated.  Review of Systems  Per HPI unless specifically indicated above     Objective:    BP 109/73 (BP Location: Left Arm, Patient Position: Sitting, Cuff Size: Large)   Pulse 87   Temp 97.8 F (36.6 C) (Oral)   Wt 239 lb 9 oz (108.7 kg)   SpO2 97%   BMI 42.44 kg/m   Wt Readings from Last 3 Encounters:  07/22/17 239 lb 9 oz (108.7 kg)  06/01/17 236 lb 8 oz (107.3 kg)  05/20/17 237 lb 11.2 oz (107.8 kg)    Physical Exam  Constitutional: She is oriented to person, place, and time. She appears well-developed and well-nourished. No distress.  HENT:  Head: Atraumatic.  Eyes: Pupils are equal, round, and reactive to light. Conjunctivae are normal.  Neck: Normal range of motion. Neck supple.  Cardiovascular: Normal rate and normal heart sounds.  Pulmonary/Chest: Effort normal and breath sounds normal. No respiratory distress.  Musculoskeletal: Normal  range of motion.  Neurological: She is alert and oriented to person, place, and time.  Skin: Skin is warm and dry.  Psychiatric: She has a normal mood and affect. Her behavior is normal.  Nursing note and vitals reviewed.   Results for orders placed or performed in visit on 07/22/17  HgB A1c  Result Value Ref Range   Hgb A1c MFr Bld 7.0 (H) 4.8 - 5.6 %   Est. average glucose Bld gHb Est-mCnc 154 mg/dL  Basic Metabolic Panel (BMET)  Result Value Ref Range   Glucose 237 (H) 65 - 99 mg/dL   BUN 13 6 - 24 mg/dL   Creatinine, Ser 1.610.73 0.57 - 1.00 mg/dL   GFR calc non Af Amer 100 >59 mL/min/1.73   GFR calc Af Amer 115 >59 mL/min/1.73   BUN/Creatinine Ratio 18 9 - 23   Sodium 134 134 - 144 mmol/L   Potassium 4.3 3.5 - 5.2 mmol/L   Chloride 96 96 - 106 mmol/L   CO2 21 20 - 29 mmol/L   Calcium 9.8 8.7 - 10.2 mg/dL  CBC with Differential/Platelet  Result Value Ref Range   WBC 4.9 3.4 - 10.8 x10E3/uL   RBC 4.47 3.77 - 5.28 x10E6/uL   Hemoglobin 11.7 11.1 - 15.9 g/dL   Hematocrit 09.636.0 04.534.0 - 46.6 %   MCV 81 79 - 97 fL   MCH 26.2 (L) 26.6 - 33.0 pg   MCHC 32.5 31.5 - 35.7 g/dL   RDW 40.924.1 (H) 81.112.3 - 91.415.4 %   Platelets 203 150 - 379 x10E3/uL   Neutrophils 51 Not Estab. %   Lymphs 41 Not Estab. %   Monocytes 7 Not Estab. %   Eos 1 Not Estab. %   Basos 0 Not Estab. %   Neutrophils Absolute 2.5 1.4 - 7.0 x10E3/uL   Lymphocytes Absolute 2.0 0.7 - 3.1 x10E3/uL   Monocytes Absolute 0.3 0.1 - 0.9 x10E3/uL   EOS (ABSOLUTE) 0.1 0.0 - 0.4 x10E3/uL   Basophils Absolute 0.0 0.0 - 0.2 x10E3/uL   Immature Granulocytes 0 Not Estab. %   Immature Grans (Abs) 0.0 0.0 - 0.1 x10E3/uL   Hematology Comments: Note:       Assessment & Plan:   Problem List Items Addressed This Visit      Endocrine   Diabetes mellitus type 2, controlled, without complications (HCC) - Primary    Will recheck A1C today. Fasting sugars not to goal per her home log. Continue working hard on lifestyle modifications and  will increase  jardiance dose. Consider switching levemir to a combo injection like soliqua if still poor control but pt states she just picked up a large supply of her levemir so wants to stay on that for now.       Relevant Medications   empagliflozin (JARDIANCE) 25 MG TABS tablet   Other Relevant Orders   HgB A1c (Completed)   Basic Metabolic Panel (BMET) (Completed)     Other   Iron deficiency anemia    Will recheck CBC today. Continue supplementation and good dietary intake      Relevant Orders   CBC with Differential/Platelet (Completed)       Follow up plan: Return in about 3 months (around 10/22/2017) for  A1C, lipid.

## 2017-07-23 LAB — CBC WITH DIFFERENTIAL/PLATELET
BASOS: 0 %
Basophils Absolute: 0 10*3/uL (ref 0.0–0.2)
EOS (ABSOLUTE): 0.1 10*3/uL (ref 0.0–0.4)
EOS: 1 %
HEMATOCRIT: 36 % (ref 34.0–46.6)
HEMOGLOBIN: 11.7 g/dL (ref 11.1–15.9)
Immature Grans (Abs): 0 10*3/uL (ref 0.0–0.1)
Immature Granulocytes: 0 %
Lymphocytes Absolute: 2 10*3/uL (ref 0.7–3.1)
Lymphs: 41 %
MCH: 26.2 pg — AB (ref 26.6–33.0)
MCHC: 32.5 g/dL (ref 31.5–35.7)
MCV: 81 fL (ref 79–97)
MONOCYTES: 7 %
MONOS ABS: 0.3 10*3/uL (ref 0.1–0.9)
Neutrophils Absolute: 2.5 10*3/uL (ref 1.4–7.0)
Neutrophils: 51 %
Platelets: 203 10*3/uL (ref 150–379)
RBC: 4.47 x10E6/uL (ref 3.77–5.28)
RDW: 24.1 % — ABNORMAL HIGH (ref 12.3–15.4)
WBC: 4.9 10*3/uL (ref 3.4–10.8)

## 2017-07-23 LAB — HEMOGLOBIN A1C
Est. average glucose Bld gHb Est-mCnc: 154 mg/dL
Hgb A1c MFr Bld: 7 % — ABNORMAL HIGH (ref 4.8–5.6)

## 2017-07-23 LAB — BASIC METABOLIC PANEL
BUN / CREAT RATIO: 18 (ref 9–23)
BUN: 13 mg/dL (ref 6–24)
CO2: 21 mmol/L (ref 20–29)
CREATININE: 0.73 mg/dL (ref 0.57–1.00)
Calcium: 9.8 mg/dL (ref 8.7–10.2)
Chloride: 96 mmol/L (ref 96–106)
GFR, EST AFRICAN AMERICAN: 115 mL/min/{1.73_m2} (ref >59)
GFR, EST NON AFRICAN AMERICAN: 100 mL/min/{1.73_m2} (ref >59)
GLUCOSE: 237 mg/dL — AB (ref 65–99)
Potassium: 4.3 mmol/L (ref 3.5–5.2)
Sodium: 134 mmol/L (ref 134–144)

## 2017-07-25 DIAGNOSIS — D509 Iron deficiency anemia, unspecified: Secondary | ICD-10-CM | POA: Insufficient documentation

## 2017-07-25 NOTE — Assessment & Plan Note (Signed)
Will recheck A1C today. Fasting sugars not to goal per her home log. Continue working hard on lifestyle modifications and will increase jardiance dose. Consider switching levemir to a combo injection like soliqua if still poor control but pt states she just picked up a large supply of her levemir so wants to stay on that for now.

## 2017-07-25 NOTE — Patient Instructions (Signed)
Follow up in 3 months

## 2017-07-25 NOTE — Assessment & Plan Note (Signed)
Will recheck CBC today. Continue supplementation and good dietary intake

## 2017-07-29 LAB — HM DIABETES EYE EXAM

## 2017-08-16 ENCOUNTER — Ambulatory Visit: Payer: Medicare Other | Admitting: Psychiatry

## 2017-08-18 ENCOUNTER — Other Ambulatory Visit: Payer: Self-pay | Admitting: Psychiatry

## 2017-08-18 DIAGNOSIS — F251 Schizoaffective disorder, depressive type: Secondary | ICD-10-CM

## 2017-08-18 NOTE — Telephone Encounter (Signed)
Patient needs to be seen in clinic

## 2017-08-19 ENCOUNTER — Telehealth: Payer: Self-pay

## 2017-08-19 DIAGNOSIS — F251 Schizoaffective disorder, depressive type: Secondary | ICD-10-CM

## 2017-08-19 MED ORDER — GABAPENTIN 400 MG PO CAPS: 400.0000 mg | ORAL_CAPSULE | Freq: Three times a day (TID) | ORAL | 0 refills | Status: DC

## 2017-08-19 NOTE — Telephone Encounter (Signed)
Pt called states she needs enough medication until her appt on  09-02-17. Pt last seen on  06-15-17  Jomarie LongsEappen, Saramma, MD Jomarie LongsEappen, Saramma, MD  Outpatient Medication Detail    Disp Refills Start End   gabapentin (NEURONTIN) 400 MG capsule 270 capsule 0 05/12/2017    Sig - Route: Take 1 capsule (400 mg total) by mouth 3 (three) times daily. - Oral   Sent to pharmacy as: gabapentin (NEURONTIN) 400 MG capsule   E-Prescribing Status: Receipt confirmed by pharmacy (05/12/2017 2:54 PM EST)

## 2017-08-19 NOTE — Telephone Encounter (Signed)
Sent to pharmacy 

## 2017-08-26 ENCOUNTER — Other Ambulatory Visit: Payer: Self-pay | Admitting: Family Medicine

## 2017-08-26 DIAGNOSIS — E119 Type 2 diabetes mellitus without complications: Secondary | ICD-10-CM

## 2017-09-02 ENCOUNTER — Ambulatory Visit (INDEPENDENT_AMBULATORY_CARE_PROVIDER_SITE_OTHER): Payer: Medicare Other

## 2017-09-02 ENCOUNTER — Ambulatory Visit: Payer: Medicare Other | Admitting: Psychiatry

## 2017-09-02 ENCOUNTER — Encounter: Payer: Self-pay | Admitting: Psychiatry

## 2017-09-02 ENCOUNTER — Other Ambulatory Visit: Payer: Self-pay

## 2017-09-02 VITALS — BP 131/76 | HR 88 | Temp 97.6°F | Wt 236.6 lb

## 2017-09-02 DIAGNOSIS — F251 Schizoaffective disorder, depressive type: Secondary | ICD-10-CM

## 2017-09-02 DIAGNOSIS — F429 Obsessive-compulsive disorder, unspecified: Secondary | ICD-10-CM

## 2017-09-02 DIAGNOSIS — F3170 Bipolar disorder, currently in remission, most recent episode unspecified: Secondary | ICD-10-CM

## 2017-09-02 DIAGNOSIS — F319 Bipolar disorder, unspecified: Secondary | ICD-10-CM

## 2017-09-02 DIAGNOSIS — F259 Schizoaffective disorder, unspecified: Secondary | ICD-10-CM

## 2017-09-02 MED ORDER — DIVALPROEX SODIUM 250 MG PO DR TAB: 250.0000 mg | DELAYED_RELEASE_TABLET | Freq: Every morning | ORAL | 0 refills | Status: DC

## 2017-09-02 MED ORDER — AMANTADINE HCL 100 MG PO CAPS: 100.0000 mg | ORAL_CAPSULE | Freq: Two times a day (BID) | ORAL | 2 refills | Status: DC

## 2017-09-02 MED ORDER — DIVALPROEX SODIUM 500 MG PO DR TAB: 500.0000 mg | DELAYED_RELEASE_TABLET | Freq: Every evening | ORAL | 0 refills | Status: DC

## 2017-09-02 MED ORDER — PALIPERIDONE ER 6 MG PO TB24: 6.0000 mg | ORAL_TABLET | Freq: Every day | ORAL | 2 refills | Status: DC

## 2017-09-02 MED ORDER — PALIPERIDONE PALMITATE ER 156 MG/ML IM SUSY
156.0000 mg | PREFILLED_SYRINGE | Freq: Once | INTRAMUSCULAR | Status: AC
  Administered 2017-09-02: 156 mg via INTRAMUSCULAR

## 2017-09-02 MED ORDER — VILAZODONE HCL 40 MG PO TABS: 40.0000 mg | ORAL_TABLET | Freq: Every morning | ORAL | 0 refills | Status: DC

## 2017-09-02 NOTE — Progress Notes (Signed)
[facility] MD OP Progress Note  09/02/2017 10:58 AM Tabitha [name]  MRN:  [id number]  Chief Complaint: Follow-up  Chief Complaint    Follow-up; Medication Refill     HPI: Tabitha [name] is a 47 year old married, Caucasian female, who is on SSD, lives in [place], presented to the clinic today for a follow-up visit.  Patient has a history of schizoaffective disorder as well as unspecified OCD symptoms.  Pt was last seen in October as she has switched her providers and was following with Dr. Shea Evans.  He reported that her medications were changed and she did not like the medications.  She decided to return back and stated that she has been becoming more paranoid and having paranoia that there are cameras in the church as well as she thinks that her husband is planning to leave her.  She reported that she thinks that there is another lady in her husband's life.  She reported that she has not seen the provider since February as she does not like the previous provider.  She has been noncompliant with her medications.  She reported that she does not know whether Zyprexa was prescribed as she feels more tired.  Her symptoms are not under control.  She brought the injection of [place] and wants to restart the injections again.  She stated that she wants to get better again.  She is still home schooling HER-2 children.  She denied having any auditory visual hallucinations.  She denied having any suicidal or homicidal ideations or plans.            Visit Diagnosis:    ICD-10-CM   1. Schizoaffective disorder, depressive type ([place]) F25.1 divalproex (DEPAKOTE) 250 MG DR tablet    divalproex (DEPAKOTE) 500 MG DR tablet    Vilazodone HCl (VIIBRYD) 40 MG TABS  2. Obsessive-compulsive disorder, unspecified type F42.9     Past Psychiatric History: History of schizoaffective disorder, OCD.  She used to follow up with [facility] in [place] in the past and then with Dr. Gretel Acre.  She denies any suicide attempts.   Denies any inpatient mental health admissions.  Past Medical History:  Past Medical History:  Diagnosis Date  . Allergic rhinitis   . Anxiety   . Asthma   . Bipolar disorder ([place])   . Depression   . Diabetes mellitus without complication ([place])   . GERD (gastroesophageal reflux disease)   . Hyperlipidemia   . Mild sleep apnea   . OCD (obsessive compulsive disorder)   . Palpitations   . Paranoia ([place])   . Schizoaffective disorder ([place])   . Sleep apnea     Past Surgical History:  Procedure Laterality Date  . CESAREAN SECTION  2006/2008  . CHOLECYSTECTOMY  2006    Family Psychiatric History: Denies.  Family History:  Family History  Problem Relation Age of Onset  . Diabetes Father   . Hyperlipidemia Father   . Diabetes Brother   . Lupus Mother   . Diabetes Mother        pre diabetes   Substance abuse history: Denies   Social History: Married, lives with her husband.  She is on SSD.  Her husband is also disabled.  He is a double amputee, from a past history of abdominal gangrene which spread.  She has 2 children aged 38 and 52 years old.  They are home schooled at this time. Social History   Socioeconomic History  . Marital status: Married    Spouse name: kevin  .  Number of children: 2  . Years of education: Not on file  . Highest education level: Bachelor's degree (e.g., BA, AB, BS)  Occupational History  . Not on file  Social Needs  . Financial resource strain: Not hard at all  . Food insecurity:    Worry: Sometimes true    Inability: Sometimes true  . Transportation needs:    Medical: No    Non-medical: No  Tobacco Use  . Smoking status: Never Smoker  . Smokeless tobacco: Never Used  Substance and Sexual Activity  . Alcohol use: No    Alcohol/week: 0.0 oz  . Drug use: No  . Sexual activity: Yes    Partners: Male    Birth control/protection: None, Surgical  Lifestyle  . Physical activity:    Days per week: 0 days    Minutes per session: 0 min   . Stress: To some extent  Relationships  . Social connections:    Talks on phone: More than three times a week    Gets together: More than three times a week    Attends religious service: More than 4 times per year    Active member of club or organization: Yes    Attends meetings of clubs or organizations: More than 4 times per year    Relationship status: Married  Other Topics Concern  . Not on file  Social History Narrative  . Not on file    Allergies:  Allergies  Allergen Reactions  . Abilify [Aripiprazole] Hives    Metabolic Disorder Labs: Lab Results  Component Value Date   HGBA1C 7.0 (H) 07/22/2017   Lab Results  Component Value Date   PROLACTIN 84.8 (H) 06/01/2017   PROLACTIN 36.2 (H) 03/26/2016   Lab Results  Component Value Date   CHOL 176 04/23/2017   TRIG 253 (H) 04/23/2017   HDL 48 03/26/2016   CHOLHDL 2.9 09/26/2015   VLDL 51 (H) 04/23/2017   LDLCALC 63 03/26/2016   LDLCALC 31 09/26/2015   Lab Results  Component Value Date   TSH 2.570 06/01/2017   TSH 2.500 03/26/2016    Therapeutic Level Labs: No results found for: LITHIUM Lab Results  Component Value Date   VALPROATE 43 (L) 06/01/2017   No components found for:  CBMZ  Current Medications: Current Outpatient Medications  Medication Sig Dispense Refill  . albuterol (PROAIR HFA) 108 (90 Base) MCG/ACT inhaler Inhale 2 puffs into the lungs every 6 (six) hours as needed for wheezing or shortness of breath. 8.5 g 6  . atorvastatin (LIPITOR) 20 MG tablet Take 1 tablet (20 mg total) by mouth at bedtime. 90 tablet 1  . benzonatate (TESSALON) 200 MG capsule TAKE 1 CAPSULE(200 MG) BY MOUTH TWICE DAILY AS NEEDED FOR COUGH 30 capsule 0  . ciprofloxacin (CILOXAN) 0.3 % ophthalmic solution Place 1 drop into the right eye every 4 (four) hours while awake. 10 mL 0  . divalproex (DEPAKOTE) 250 MG DR tablet Take 1 tablet (250 mg total) by mouth every morning. 90 tablet 0  . divalproex (DEPAKOTE) 500 MG DR  tablet Take 1 tablet (500 mg total) by mouth every evening. 90 tablet 0  . doxycycline (VIBRAMYCIN) 50 MG capsule Take 1 capsule (50 mg total) by mouth every morning. 30 capsule 6  . empagliflozin (JARDIANCE) 25 MG TABS tablet Take 25 mg by mouth daily. 30 tablet 2  . fluticasone (FLOVENT HFA) 110 MCG/ACT inhaler Inhale 1 puff into the lungs 2 (two) times daily. 1 Inhaler 11  .  gabapentin (NEURONTIN) 400 MG capsule Take 1 capsule (400 mg total) by mouth 3 (three) times daily. 270 capsule 0  . glucose blood (ACCU-CHEK AVIVA PLUS) test strip 1 each by Other route as needed for other. Use as instructed 100 each 12  . hydrochlorothiazide (HYDRODIURIL) 12.5 MG tablet TAKE 1 TABLET(12.5 MG) BY MOUTH DAILY 90 tablet 1  . Insulin Detemir (LEVEMIR) 100 UNIT/ML Pen Inject 40 Units into the skin 2 (two) times daily. 5 pen 11  . Insulin Pen Needle (BD PEN NEEDLE NANO U/F) 32G X 4 MM MISC 1 each by Does not apply route daily. 100 each 5  . metFORMIN (GLUCOPHAGE) 500 MG tablet TAKE 2 TABLETS BY MOUTH TWICE DAILY BEFORE A MEAL 180 tablet 0  . NOVOTWIST 32G X 5 MM MISC USE WITH LEVEMIR PEN BID  5  . omeprazole (PRILOSEC) 10 MG capsule TAKE 1 CAPSULE BY MOUTH EVERY DAY 30 capsule 5  . pioglitazone (ACTOS) 45 MG tablet Take 1 tablet (45 mg total) by mouth daily. 30 tablet 12  . trimethoprim-polymyxin b (POLYTRIM) ophthalmic solution Place 1 drop into the right eye every 4 (four) hours. 10 mL 0  . Vilazodone HCl (VIIBRYD) 40 MG TABS Take 1 tablet (40 mg total) by mouth every morning. 90 tablet 0  . amantadine (SYMMETREL) 100 MG capsule Take 1 capsule (100 mg total) by mouth 2 (two) times daily. 60 capsule 2  . paliperidone (INVEGA) 6 MG 24 hr tablet Take 1 tablet (6 mg total) by mouth daily. 30 tablet 2   No current facility-administered medications for this visit.      Musculoskeletal: Strength & Muscle Tone: within normal limits Gait & Station: normal Patient leans: N/A  Psychiatric Specialty Exam: Review  of Systems  Psychiatric/Behavioral: The patient is nervous/anxious.   All other systems reviewed and are negative.   Blood pressure 131/76, pulse 88, temperature 97.6 F (36.4 C), temperature source Oral, weight 236 lb 9.6 oz (107.3 kg).Body mass index is 41.91 kg/m.  General Appearance: Casual  Eye Contact:  Fair  Speech:  Normal Rate  Volume:  Normal  Mood:  Anxious  Affect:  Congruent  Thought Process:  Goal Directed and Descriptions of Associations: Intact  Orientation:  Full (Time, Place, and Person)  Thought Content: Delusions and Paranoid Ideation , chronic , on and off, able to cope  Suicidal Thoughts:  No  Homicidal Thoughts:  No  Memory:  Immediate;   Fair Recent;   Fair Remote;   Fair  Judgement:  Fair  Insight:  Fair  Psychomotor Activity:  Normal  Concentration:  Concentration: Fair and Attention Span: Fair  Recall:  [employer] of Knowledge: Fair  Language: Fair  Akathisia:  No  Handed:  Right  AIMS (if indicated): 0  Assets:  Communication Skills Desire for Improvement Housing Intimacy Social Support Talents/Skills  ADL's:  Intact  Cognition: WNL  Sleep:  Fair   Screenings: AIMS     Office Visit from 05/12/2017 in [facility] Office Visit from 07/29/2016 in [facility] Office Visit from 05/06/2016 in [facility] Visit from 04/01/2016 in [facility] Office Visit from 03/28/2015 in [facility] Total Score  0  0  0  0  0    PHQ2-9     Clinical Support from 01/13/2017 in [facility] Visit from 09/24/2015 in [facility] Office Visit from 06/20/2015 in [facility] Office Visit from 03/15/2015 in [facility] Office Visit from 01/08/2015 in [facility]  PHQ-2 Total Score  2  3  0  0  4  PHQ-9 Total Score  12  8  -  -  12        Assessment and Plan: Monet is a 47 year old Caucasian female who has a history of schizoaffective disorder, anxiety disorder, insomnia, OSA on CPAP who presented to the clinic today for a follow-up visit.     Plan For schizoaffective disorder Invega sustenna 156 mg IM q. 28 days.   She will be given [place] injection today.  If her symptoms are not improved she will receive injection of Invega Sustenna 234 mg on the next visit   For anxiety symptoms Continue Viibryd 40 mg p.o. daily Continue gabapentin 400 mg p.o. 3 times daily.  She also takes it for neuropathy.  Discontinue Zyprexa. We will start her on Invega oral 6 mg p.o. daily as she has responded well in the past.    Continue Depakote as prescribed   Follow up in clinic in 4 weeks or sooner if needed.  More than 50 % of the time was spent for psychoeducation and supportive psychotherapy and care coordination.  This note was generated in part or whole with voice recognition software. Voice recognition is usually quite accurate but there are transcription errors that can and very often do occur. I apologize for any typographical errors that were not detected and corrected.        Rainey Pines, MD 09/02/2017, 10:58 AM

## 2017-09-02 NOTE — Progress Notes (Signed)
right Upper Outer Quadrant invega  injection  lot [id number] exp 07-2018 [id number]  ndc@ [id number]

## 2017-09-20 ENCOUNTER — Other Ambulatory Visit: Payer: Self-pay | Admitting: Family Medicine

## 2017-09-29 ENCOUNTER — Other Ambulatory Visit: Payer: Self-pay | Admitting: Family Medicine

## 2017-09-29 ENCOUNTER — Telehealth: Payer: Self-pay

## 2017-09-29 DIAGNOSIS — E119 Type 2 diabetes mellitus without complications: Secondary | ICD-10-CM

## 2017-09-29 DIAGNOSIS — Z794 Long term (current) use of insulin: Secondary | ICD-10-CM

## 2017-09-29 NOTE — Telephone Encounter (Signed)
pt called left a message with lea at front desk that she needs a rx for the invaga shot she needs for her appt for june 10th

## 2017-09-30 NOTE — Telephone Encounter (Signed)
Omeprazole refill Last Refill:09/23/17 # 30 capsules Last OV: cannot find OV where med was addressed PCP: Roosvelt Maserachel Lane PA Pharmacy:[facility] [address]

## 2017-10-04 ENCOUNTER — Ambulatory Visit (INDEPENDENT_AMBULATORY_CARE_PROVIDER_SITE_OTHER): Payer: Medicare Other

## 2017-10-04 ENCOUNTER — Ambulatory Visit: Payer: Medicare Other | Admitting: Psychiatry

## 2017-10-04 ENCOUNTER — Other Ambulatory Visit: Payer: Self-pay | Admitting: Family Medicine

## 2017-10-04 ENCOUNTER — Encounter: Payer: Self-pay | Admitting: Psychiatry

## 2017-10-04 ENCOUNTER — Other Ambulatory Visit: Payer: Self-pay

## 2017-10-04 ENCOUNTER — Other Ambulatory Visit: Payer: Self-pay | Admitting: Psychiatry

## 2017-10-04 VITALS — BP 138/79 | HR 84 | Temp 97.8°F | Ht 63.0 in | Wt 227.0 lb

## 2017-10-04 DIAGNOSIS — Z794 Long term (current) use of insulin: Secondary | ICD-10-CM

## 2017-10-04 DIAGNOSIS — F428 Other obsessive-compulsive disorder: Secondary | ICD-10-CM

## 2017-10-04 DIAGNOSIS — E119 Type 2 diabetes mellitus without complications: Secondary | ICD-10-CM

## 2017-10-04 DIAGNOSIS — F251 Schizoaffective disorder, depressive type: Secondary | ICD-10-CM | POA: Diagnosis not present

## 2017-10-04 DIAGNOSIS — F429 Obsessive-compulsive disorder, unspecified: Secondary | ICD-10-CM | POA: Diagnosis not present

## 2017-10-04 DIAGNOSIS — F319 Bipolar disorder, unspecified: Secondary | ICD-10-CM

## 2017-10-04 MED ORDER — VILAZODONE HCL 40 MG PO TABS: 40.0000 mg | ORAL_TABLET | Freq: Every morning | ORAL | 0 refills | Status: DC

## 2017-10-04 MED ORDER — PALIPERIDONE PALMITATE ER 156 MG/ML IM SUSY
156.0000 mg | PREFILLED_SYRINGE | Freq: Once | INTRAMUSCULAR | Status: AC
Start: 2017-10-04 — End: 2017-10-04
  Administered 2017-10-04: 156 mg via INTRAMUSCULAR

## 2017-10-04 MED ORDER — PALIPERIDONE ER 6 MG PO TB24: 6.0000 mg | ORAL_TABLET | Freq: Every day | ORAL | 2 refills | Status: DC

## 2017-10-04 MED ORDER — PALIPERIDONE PALMITATE ER 156 MG/ML IM SUSY: 156.0000 mg | PREFILLED_SYRINGE | Freq: Once | INTRAMUSCULAR | 6 refills | Status: DC

## 2017-10-04 MED ORDER — AMANTADINE HCL 100 MG PO CAPS: 100.0000 mg | ORAL_CAPSULE | Freq: Two times a day (BID) | ORAL | 2 refills | Status: DC

## 2017-10-04 MED ORDER — DIVALPROEX SODIUM 500 MG PO DR TAB: 500.0000 mg | DELAYED_RELEASE_TABLET | Freq: Every evening | ORAL | 0 refills | Status: DC

## 2017-10-04 MED ORDER — DIVALPROEX SODIUM 250 MG PO DR TAB: 250.0000 mg | DELAYED_RELEASE_TABLET | Freq: Every morning | ORAL | 0 refills | Status: DC

## 2017-10-04 NOTE — Telephone Encounter (Signed)
Rx by another provider- patient is current with visits. She has appointment 10/22/17.

## 2017-10-04 NOTE — Progress Notes (Signed)
[facility] MD OP Progress Note  10/04/2017 3:06 PM Tabitha HouseholderKathleen J [name]  MRN:  [id number]  Chief Complaint: Follow-up  Chief Complaint    Follow-up; Medication Refill     HPI: Tabitha JohnsKathleen is a 47 year old married, Caucasian female, who is on SSD, lives in [place], presented to the clinic today for a follow-up visit.  Patient has a history of schizoaffective disorder as well as unspecified OCD symptoms.  Pt reported that she has noticed improvement in the symptoms as she was started on the [place] Sustenna and she does not feel that there are cameras in the charge.  She reported that the auditory hallucinations are also improving.  She reported that the paranoia continues but it is less severe as compared to her last visit.  She reported that she has some OCD symptoms.  She has also lost 10 pounds since the Zyprexa was discontinued.  She is using an App to monitor her food intake with the help of her friend.  She appears more calm and alert during the interview.  She reported that she wants to continue the same medications.  She currently denied having any suicidal homicidal ideations or plans.  Patient reported that her blood sugar and diabetes is also getting under control.  She has been sleeping well at night.  She denied having any perceptual disturbances.  She appears calm and alert during the interview             Visit Diagnosis:    ICD-10-CM   1. Schizoaffective disorder, depressive type (H[facility]) F25.1 divalproex (DEPAKOTE) 500 MG DR tablet    divalproex (DEPAKOTE) 250 MG DR tablet    Vilazodone HCl (VIIBRYD) 40 MG TABS  2. Other obsessive-compulsive disorders F42.8     Past Psychiatric History: History of schizoaffective disorder, OCD.  She used to follow up with [facility] in [place] in the past and then with Dr. Garnetta BuddyFaheem.  She denies any suicide attempts.  Denies any inpatient mental health admissions.  Past Medical History:  Past Medical History:  Diagnosis Date  . Allergic rhinitis    . Anxiety   . Asthma   . Bipolar disorder (H[facility])   . Depression   . Diabetes mellitus without complication ([facility])   . GERD (gastroesophageal reflux disease)   . Hyperlipidemia   . Mild sleep apnea   . OCD (obsessive compulsive disorder)   . Palpitations   . Paranoia ([facility])   . Schizoaffective disorder (H[facility])   . Sleep apnea     Past Surgical History:  Procedure Laterality Date  . CESAREAN SECTION  2006/2008  . CHOLECYSTECTOMY  2006    Family Psychiatric History: Denies.  Family History:  Family History  Problem Relation Age of Onset  . Diabetes Father   . Hyperlipidemia Father   . Diabetes Brother   . Lupus Mother   . Diabetes Mother        pre diabetes   Substance abuse history: Denies   Social History: Married, lives with her husband.  She is on SSD.  Her husband is also disabled.  He is a double amputee, from a past history of abdominal gangrene which spread.  She has 2 children aged 46 and 46 years old.  They are home schooled at this time. Social History   Socioeconomic History  . Marital status: Married    Spouse name: kevin  . Number of children: 2  . Years of education: Not on file  . Highest education level: Bachelor's degree (e.g., BA, AB,  BS)  Occupational History  . Not on file  Social Needs  . Financial resource strain: Not hard at all  . Food insecurity:    Worry: Sometimes true    Inability: Sometimes true  . Transportation needs:    Medical: No    Non-medical: No  Tobacco Use  . Smoking status: Never Smoker  . Smokeless tobacco: Never Used  Substance and Sexual Activity  . Alcohol use: No    Alcohol/week: 0.0 oz  . Drug use: No  . Sexual activity: Yes    Partners: Male    Birth control/protection: None, Surgical  Lifestyle  . Physical activity:    Days per week: 0 days    Minutes per session: 0 min  . Stress: To some extent  Relationships  . Social connections:    Talks on phone: More than three times a week    Gets together: More  than three times a week    Attends religious service: More than 4 times per year    Active member of club or organization: Yes    Attends meetings of clubs or organizations: More than 4 times per year    Relationship status: Married  Other Topics Concern  . Not on file  Social History Narrative  . Not on file    Allergies:  Allergies  Allergen Reactions  . Abilify [Aripiprazole] Hives    Metabolic Disorder Labs: Lab Results  Component Value Date   HGBA1C 7.0 (H) 07/22/2017   Lab Results  Component Value Date   PROLACTIN 84.8 (H) 06/01/2017   PROLACTIN 36.2 (H) 03/26/2016   Lab Results  Component Value Date   CHOL 176 04/23/2017   TRIG 253 (H) 04/23/2017   HDL 48 03/26/2016   CHOLHDL 2.9 09/26/2015   VLDL 51 (H) 04/23/2017   LDLCALC 63 03/26/2016   LDLCALC 31 09/26/2015   Lab Results  Component Value Date   TSH 2.570 06/01/2017   TSH 2.500 03/26/2016    Therapeutic Level Labs: No results found for: LITHIUM Lab Results  Component Value Date   VALPROATE 43 (L) 06/01/2017   No components found for:  CBMZ  Current Medications: Current Outpatient Medications  Medication Sig Dispense Refill  . albuterol (PROAIR HFA) 108 (90 Base) MCG/ACT inhaler Inhale 2 puffs into the lungs every 6 (six) hours as needed for wheezing or shortness of breath. 8.5 g 6  . amantadine (SYMMETREL) 100 MG capsule Take 1 capsule (100 mg total) by mouth 2 (two) times daily. 60 capsule 2  . atorvastatin (LIPITOR) 20 MG tablet Take 1 tablet (20 mg total) by mouth at bedtime. 90 tablet 1  . benzonatate (TESSALON) 200 MG capsule TAKE 1 CAPSULE(200 MG) BY MOUTH TWICE DAILY AS NEEDED FOR COUGH 30 capsule 0  . ciprofloxacin (CILOXAN) 0.3 % ophthalmic solution Place 1 drop into the right eye every 4 (four) hours while awake. 10 mL 0  . divalproex (DEPAKOTE) 250 MG DR tablet Take 1 tablet (250 mg total) by mouth every morning. 90 tablet 0  . divalproex (DEPAKOTE) 500 MG DR tablet Take 1 tablet (500  mg total) by mouth every evening. 90 tablet 0  . doxycycline (VIBRAMYCIN) 50 MG capsule Take 1 capsule (50 mg total) by mouth every morning. 30 capsule 6  . empagliflozin (JARDIANCE) 25 MG TABS tablet Take 25 mg by mouth daily. 30 tablet 2  . fluticasone (FLOVENT HFA) 110 MCG/ACT inhaler Inhale 1 puff into the lungs 2 (two) times daily. 1 Inhaler 11  .  gabapentin (NEURONTIN) 400 MG capsule Take 1 capsule (400 mg total) by mouth 3 (three) times daily. 270 capsule 0  . glucose blood (ACCU-CHEK AVIVA PLUS) test strip 1 each by Other route as needed for other. Use as instructed 100 each 12  . hydrochlorothiazide (HYDRODIURIL) 12.5 MG tablet TAKE 1 TABLET(12.5 MG) BY MOUTH DAILY 90 tablet 1  . Insulin Detemir (LEVEMIR) 100 UNIT/ML Pen Inject 40 Units into the skin 2 (two) times daily. 5 pen 11  . Insulin Pen Needle (BD PEN NEEDLE NANO U/F) 32G X 4 MM MISC 1 each by Does not apply route daily. 100 each 5  . metFORMIN (GLUCOPHAGE) 500 MG tablet TAKE 2 TABLETS BY MOUTH TWICE DAILY BEFORE A MEAL 180 tablet 0  . NOVOTWIST 32G X 5 MM MISC USE WITH LEVEMIR PEN BID  5  . omeprazole (PRILOSEC) 10 MG capsule TAKE 1 CAPSULE BY MOUTH EVERY DAY 30 capsule 0  . paliperidone (INVEGA SUSTENNA) 156 MG/ML SUSY injection Inject 1 mL (156 mg total) into the muscle once for 1 dose. 1 mL 6  . paliperidone (INVEGA) 6 MG 24 hr tablet Take 1 tablet (6 mg total) by mouth daily. 30 tablet 2  . pioglitazone (ACTOS) 45 MG tablet Take 1 tablet (45 mg total) by mouth daily. 30 tablet 12  . trimethoprim-polymyxin b (POLYTRIM) ophthalmic solution Place 1 drop into the right eye every 4 (four) hours. 10 mL 0  . Vilazodone HCl (VIIBRYD) 40 MG TABS Take 1 tablet (40 mg total) by mouth every morning. 90 tablet 0   No current facility-administered medications for this visit.      Musculoskeletal: Strength & Muscle Tone: within normal limits Gait & Station: normal Patient leans: N/A  Psychiatric Specialty Exam: Review of Systems   Psychiatric/Behavioral: The patient is nervous/anxious.   All other systems reviewed and are negative.   Blood pressure 138/79, pulse 84, temperature 97.8 F (36.6 C), temperature source Oral, height 5\' 3"  (1.6 m), weight 227 lb (103 kg).Body mass index is 40.21 kg/m.  General Appearance: Casual  Eye Contact:  Fair  Speech:  Normal Rate  Volume:  Normal  Mood:  Anxious  Affect:  Congruent  Thought Process:  Goal Directed and Descriptions of Associations: Intact  Orientation:  Full (Time, Place, and Person)  Thought Content: Delusions and Paranoid Ideation , chronic , on and off, able to cope  Suicidal Thoughts:  No  Homicidal Thoughts:  No  Memory:  Immediate;   Fair Recent;   Fair Remote;   Fair  Judgement:  Fair  Insight:  Fair  Psychomotor Activity:  Normal  Concentration:  Concentration: Fair and Attention Span: Fair  Recall:  [employer] of Knowledge: Fair  Language: Fair  Akathisia:  No  Handed:  Right  AIMS (if indicated): 0  Assets:  Communication Skills Desire for Improvement Housing Intimacy Social Support Talents/Skills  ADL's:  Intact  Cognition: WNL  Sleep:  Fair   Screenings: AIMS     [facility] Visit from 05/12/2017 in [facility] Office Visit from 07/29/2016 in [facility] Office Visit from 05/06/2016 in [facility] Office Visit from 04/01/2016 in [facility] Office Visit from 03/28/2015 in [facility]  AIMS Total Score  0  0  0  0  0    PHQ2-9     Clinical Support from 01/13/2017 in [facility] Office Visit from 09/24/2015 in [facility] Office Visit  from 06/20/2015 in [facility] Office Visit from 03/15/2015 in [facility] Office Visit from 01/08/2015 in [facility]  PHQ-2 Total Score  2  3  0  0  4  PHQ-9 Total Score  12  8  -  -   12       Assessment and Plan: Deidra is a 47 year old Caucasian female who has a history of schizoaffective disorder, anxiety disorder, insomnia, OSA on CPAP who presented to the clinic today for a follow-up visit.     Plan For schizoaffective disorder Invega sustenna 156 mg IM q. 28 days.   She will be given [place] injection today.    For anxiety symptoms Continue Viibryd 40 mg p.o. daily Continue gabapentin 400 mg p.o. 3 times daily.  She also takes it for neuropathy.   We will start her on Invega oral 6 mg p.o. daily as she has responded well in the past.    Continue Depakote as prescribed   Follow up in clinic in 4 weeks or sooner if needed.  More than 50 % of the time was spent for psychoeducation and supportive psychotherapy and care coordination.  This note was generated in part or whole with voice recognition software. Voice recognition is usually quite accurate but there are transcription errors that can and very often do occur. I apologize for any typographical errors that were not detected and corrected.        Brandy Hale, MD 10/04/2017, 3:06 PM

## 2017-10-04 NOTE — Progress Notes (Signed)
Left Upper Outer Quadrant invega 156mg  injection  lot [id number] exp 08-2018 lot# [id number]  NDC# [id number]   pt is doing well on injection.

## 2017-10-19 NOTE — Telephone Encounter (Signed)
paliperidone (INVEGA SUSTENNA) 156 MG/ML SUSY injection 1 mL 6 10/04/2017 10/04/2017   Sig - Route: Inject 1 mL (156 mg total) into the muscle once for 1 dose. - Intramuscular   Sent to pharmacy as: paliperidone (INVEGA SUSTENNA) 156 MG/ML Suspension Prefilled Syringe injection   E-Prescribing Status: Receipt confirmed by pharmacy (10/04/2017 9:38 AM EDT)

## 2017-10-20 ENCOUNTER — Other Ambulatory Visit: Payer: Self-pay | Admitting: Psychiatry

## 2017-10-20 ENCOUNTER — Other Ambulatory Visit: Payer: Self-pay | Admitting: Family Medicine

## 2017-10-20 DIAGNOSIS — F251 Schizoaffective disorder, depressive type: Secondary | ICD-10-CM

## 2017-10-20 DIAGNOSIS — E119 Type 2 diabetes mellitus without complications: Secondary | ICD-10-CM

## 2017-10-22 ENCOUNTER — Ambulatory Visit (INDEPENDENT_AMBULATORY_CARE_PROVIDER_SITE_OTHER): Payer: Medicare Other | Admitting: Family Medicine

## 2017-10-22 ENCOUNTER — Encounter: Payer: Self-pay | Admitting: Family Medicine

## 2017-10-22 VITALS — BP 110/74 | HR 63 | Temp 97.8°F | Wt 225.6 lb

## 2017-10-22 DIAGNOSIS — E119 Type 2 diabetes mellitus without complications: Secondary | ICD-10-CM | POA: Diagnosis not present

## 2017-10-22 DIAGNOSIS — F259 Schizoaffective disorder, unspecified: Secondary | ICD-10-CM

## 2017-10-22 DIAGNOSIS — J4520 Mild intermittent asthma, uncomplicated: Secondary | ICD-10-CM

## 2017-10-22 DIAGNOSIS — L719 Rosacea, unspecified: Secondary | ICD-10-CM

## 2017-10-22 DIAGNOSIS — K219 Gastro-esophageal reflux disease without esophagitis: Secondary | ICD-10-CM | POA: Diagnosis not present

## 2017-10-22 DIAGNOSIS — D509 Iron deficiency anemia, unspecified: Secondary | ICD-10-CM

## 2017-10-22 DIAGNOSIS — I10 Essential (primary) hypertension: Secondary | ICD-10-CM

## 2017-10-22 DIAGNOSIS — E7800 Pure hypercholesterolemia, unspecified: Secondary | ICD-10-CM | POA: Diagnosis not present

## 2017-10-22 DIAGNOSIS — F3170 Bipolar disorder, currently in remission, most recent episode unspecified: Secondary | ICD-10-CM | POA: Diagnosis not present

## 2017-10-22 DIAGNOSIS — E1140 Type 2 diabetes mellitus with diabetic neuropathy, unspecified: Secondary | ICD-10-CM

## 2017-10-22 DIAGNOSIS — E0800 Diabetes mellitus due to underlying condition with hyperosmolarity without nonketotic hyperglycemic-hyperosmolar coma (NKHHC): Secondary | ICD-10-CM | POA: Diagnosis not present

## 2017-10-22 DIAGNOSIS — Z79899 Other long term (current) drug therapy: Secondary | ICD-10-CM

## 2017-10-22 DIAGNOSIS — Z794 Long term (current) use of insulin: Secondary | ICD-10-CM

## 2017-10-22 LAB — MICROALBUMIN, URINE WAIVED
Creatinine, Urine Waived: 50 mg/dL (ref 10–300)
Microalb, Ur Waived: 10 mg/L (ref 0–19)
Microalb/Creat Ratio: <30 mg/g (ref <30)

## 2017-10-22 LAB — BAYER DCA HB A1C WAIVED: HB A1C (BAYER DCA - WAIVED): 6.9 % (ref <7.0)

## 2017-10-22 MED ORDER — METFORMIN HCL 500 MG PO TABS: ORAL_TABLET | ORAL | 1 refills | Status: DC

## 2017-10-22 MED ORDER — ATORVASTATIN CALCIUM 20 MG PO TABS: 20.0000 mg | ORAL_TABLET | Freq: Every day | ORAL | 1 refills | Status: DC

## 2017-10-22 MED ORDER — PIOGLITAZONE HCL 45 MG PO TABS: 45.0000 mg | ORAL_TABLET | Freq: Every day | ORAL | 1 refills | Status: DC

## 2017-10-22 MED ORDER — INSULIN DETEMIR 100 UNIT/ML FLEXPEN: 40.0000 [IU] | PEN_INJECTOR | Freq: Two times a day (BID) | SUBCUTANEOUS | 11 refills | Status: DC

## 2017-10-22 MED ORDER — OMEPRAZOLE 10 MG PO CPDR: 10.0000 mg | DELAYED_RELEASE_CAPSULE | Freq: Every day | ORAL | 1 refills | Status: DC

## 2017-10-22 MED ORDER — INSULIN PEN NEEDLE 32G X 4 MM MISC: 1.0000 | Freq: Every day | 12 refills | Status: DC

## 2017-10-22 MED ORDER — EMPAGLIFLOZIN 25 MG PO TABS: 25.0000 mg | ORAL_TABLET | Freq: Every day | ORAL | 1 refills | Status: DC

## 2017-10-22 MED ORDER — GLUCOSE BLOOD VI STRP: 1.0000 | ORAL_STRIP | 12 refills | Status: DC | PRN

## 2017-10-22 MED ORDER — DOXYCYCLINE HYCLATE 50 MG PO CAPS: 50.0000 mg | ORAL_CAPSULE | Freq: Every morning | ORAL | 1 refills | Status: DC

## 2017-10-22 MED ORDER — ALBUTEROL SULFATE HFA 108 (90 BASE) MCG/ACT IN AERS: 2.0000 | INHALATION_SPRAY | Freq: Four times a day (QID) | RESPIRATORY_TRACT | 6 refills | Status: DC | PRN

## 2017-10-22 MED ORDER — FLUTICASONE PROPIONATE HFA 110 MCG/ACT IN AERO
1.0000 | INHALATION_SPRAY | Freq: Two times a day (BID) | RESPIRATORY_TRACT | 11 refills | Status: DC
Start: 2017-10-22 — End: 2018-11-15

## 2017-10-22 MED ORDER — HYDROCHLOROTHIAZIDE 12.5 MG PO TABS: ORAL_TABLET | ORAL | 1 refills | Status: DC

## 2017-10-22 NOTE — Assessment & Plan Note (Signed)
Under good control. Continue current regimen. Continue to monitor. Call with any concerns. Refills given today. 

## 2017-10-22 NOTE — Assessment & Plan Note (Signed)
Continue to follow with psychiatry call with any concerns. depakote levels checked today.

## 2017-10-22 NOTE — Assessment & Plan Note (Signed)
Continue to follow with psychiatry call with any concerns. depakote levels checked today. 

## 2017-10-22 NOTE — Progress Notes (Signed)
BP 110/74 (BP Location: Left Arm, Patient Position: Sitting, Cuff Size: Large)   Pulse 63   Temp 97.8 F (36.6 C)   Wt 225 lb 9 oz (102.3 kg)   SpO2 99%   BMI 39.96 kg/m    Subjective:    Patient ID: Tabitha [name], female    DOB: [birth year], 47 y.o.   MRN: [id number]  HPI: Tabitha [name] is a 47 y.o. female  Chief Complaint  Patient presents with  . Diabetes   DIABETES Hypoglycemic episodes:no Polydipsia/polyuria: no Visual disturbance: no Chest pain: no Paresthesias: no Glucose Monitoring: yes  Accucheck frequency: TID Taking Insulin?: yes Blood Pressure Monitoring: not checking Retinal Examination: Up to Date Foot Exam: Up to Date Diabetic Education: Completed Pneumovax: Up to Date Influenza: Up to Date Aspirin: yes  HYPERTENSION / HYPERLIPIDEMIA Satisfied with current treatment? yes Duration of hypertension: chronic BP monitoring frequency: not checking BP range:  BP medication side effects: no Past BP meds: HCTZ Duration of hyperlipidemia: chronic Cholesterol medication side effects: no Cholesterol supplements: none Past cholesterol medications: atorvastatin Medication compliance: excellent compliance Aspirin: yes Recent stressors: no Recurrent headaches: no Visual changes: no Palpitations: no Dyspnea: no Chest pain: no Lower extremity edema: no Dizzy/lightheaded: no  ANEMIA Anemia status: stable Compliance with treatment: excellent compliance Iron supplementation side effects: no Severity of anemia: mild Fatigue: yes Decreased exercise tolerance: no  Dyspnea on exertion: no Palpitations: no Bleeding: no Pica: no  Relevant past medical, surgical, family and social history reviewed and updated as indicated. Interim medical history since our last visit reviewed. Allergies and medications reviewed and updated.  Review of Systems  Constitutional: Negative.   Respiratory: Negative.   Cardiovascular: Negative.   Gastrointestinal:  Negative.   Musculoskeletal: Negative.   Neurological: Negative.   Psychiatric/Behavioral: Negative.     Per HPI unless specifically indicated above     Objective:    BP 110/74 (BP Location: Left Arm, Patient Position: Sitting, Cuff Size: Large)   Pulse 63   Temp 97.8 F (36.6 C)   Wt 225 lb 9 oz (102.3 kg)   SpO2 99%   BMI 39.96 kg/m   Wt Readings from Last 3 Encounters:  10/22/17 225 lb 9 oz (102.3 kg)  07/22/17 239 lb 9 oz (108.7 kg)  06/01/17 236 lb 8 oz (107.3 kg)    Physical Exam  Constitutional: She is oriented to person, place, and time. She appears well-developed and well-nourished. No distress.  HENT:  Head: Normocephalic and atraumatic.  Right Ear: Hearing normal.  Left Ear: Hearing normal.  Nose: Nose normal.  Eyes: Conjunctivae and lids are normal. Right eye exhibits no discharge. Left eye exhibits no discharge. No scleral icterus.  Cardiovascular: Normal rate, regular rhythm, normal heart sounds and intact distal pulses. Exam reveals no gallop and no friction rub.  No murmur heard. Pulmonary/Chest: Effort normal and breath sounds normal. No stridor. No respiratory distress. She has no wheezes. She has no rales. She exhibits no tenderness.  Musculoskeletal: Normal range of motion.  Neurological: She is alert and oriented to person, place, and time.  Skin: Skin is warm, dry and intact. Capillary refill takes less than 2 seconds. No rash noted. She is not diaphoretic. No erythema. No pallor.  Psychiatric: She has a normal mood and affect. Her speech is normal and behavior is normal. Judgment and thought content normal. Cognition and memory are normal.  Nursing note and vitals reviewed.   Results for orders placed or performed in visit on  08/02/17  HM DIABETES EYE EXAM  Result Value Ref Range   HM Diabetic Eye Exam No Retinopathy No Retinopathy      Assessment & Plan:   Problem List Items Addressed This Visit      Cardiovascular and Mediastinum   HBP  (high blood pressure) - Primary    Under good control. Continue current regimen. Continue to monitor. Call with any concerns. Refills given today.      Relevant Medications   atorvastatin (LIPITOR) 20 MG tablet   hydrochlorothiazide (HYDRODIURIL) 12.5 MG tablet   Other Relevant Orders   CBC with Differential/Platelet   Comprehensive metabolic panel   Microalbumin, Urine Waived     Respiratory   Asthma    Under good control. Continue current regimen. Continue to monitor. Call with any concerns. Refills given today.      Relevant Medications   albuterol (PROAIR HFA) 108 (90 Base) MCG/ACT inhaler   fluticasone (FLOVENT HFA) 110 MCG/ACT inhaler     Digestive   GERD without esophagitis    Under good control. Continue current regimen. Continue to monitor. Call with any concerns. Refills given today.      Relevant Medications   omeprazole (PRILOSEC) 10 MG capsule   Other Relevant Orders   CBC with Differential/Platelet   Comprehensive metabolic panel   Microalbumin, Urine Waived     Endocrine   Controlled type 2 diabetes mellitus with diabetic neuropathy, with long-term current use of insulin (HCC)    Under good control with A1c of 6.9. Continue current regimen. Continue to monitor. Call with any concerns. Refills given today.      Relevant Medications   atorvastatin (LIPITOR) 20 MG tablet   empagliflozin (JARDIANCE) 25 MG TABS tablet   Insulin Detemir (LEVEMIR) 100 UNIT/ML Pen   metFORMIN (GLUCOPHAGE) 500 MG tablet   pioglitazone (ACTOS) 45 MG tablet     Musculoskeletal and Integument   Acne rosacea    Under good control. Continue current regimen. Continue to monitor. Call with any concerns. Refills given today.      Relevant Medications   doxycycline (VIBRAMYCIN) 50 MG capsule     Other   Hyperlipidemia    Under good control. Continue current regimen. Continue to monitor. Call with any concerns. Refills given today.      Relevant Medications   atorvastatin  (LIPITOR) 20 MG tablet   hydrochlorothiazide (HYDRODIURIL) 12.5 MG tablet   Other Relevant Orders   CBC with Differential/Platelet   Comprehensive metabolic panel   Lipid Panel w/o Chol/HDL Ratio   Microalbumin, Urine Waived   Bipolar disorder in partial remission (HCC)    Continue to follow with psychiatry call with any concerns. depakote levels checked today.      Schizoaffective disorder (HCC)    Continue to follow with psychiatry call with any concerns. depakote levels checked today.      Iron deficiency anemia    Rechecking levels today. Call with any concerns. Continue to monitor.       Relevant Orders   CBC with Differential/Platelet   Comprehensive metabolic panel   Microalbumin, Urine Waived   Iron and TIBC   Ferritin    Other Visit Diagnoses    Long-term use of high-risk medication       Labs drawn today. Await today.   Relevant Orders   Valproic Acid level   Type 2 diabetes mellitus without complication, without long-term current use of insulin (HCC)       Relevant Medications   atorvastatin (LIPITOR) 20  MG tablet   empagliflozin (JARDIANCE) 25 MG TABS tablet   Insulin Detemir (LEVEMIR) 100 UNIT/ML Pen   metFORMIN (GLUCOPHAGE) 500 MG tablet   pioglitazone (ACTOS) 45 MG tablet   Diabetes mellitus without complication (HCC)       Relevant Medications   atorvastatin (LIPITOR) 20 MG tablet   empagliflozin (JARDIANCE) 25 MG TABS tablet   Insulin Detemir (LEVEMIR) 100 UNIT/ML Pen   metFORMIN (GLUCOPHAGE) 500 MG tablet   pioglitazone (ACTOS) 45 MG tablet   Diabetes mellitus due to underlying condition with hyperosmolarity without coma, with long-term current use of insulin (HCC)       Relevant Medications   atorvastatin (LIPITOR) 20 MG tablet   empagliflozin (JARDIANCE) 25 MG TABS tablet   Insulin Detemir (LEVEMIR) 100 UNIT/ML Pen   metFORMIN (GLUCOPHAGE) 500 MG tablet   pioglitazone (ACTOS) 45 MG tablet       Follow up plan: Return in about 6 months  (around 04/23/2018) for Physical.

## 2017-10-22 NOTE — Assessment & Plan Note (Signed)
Under good control with A1c of 6.9. Continue current regimen. Continue to monitor. Call with any concerns. Refills given today.

## 2017-10-22 NOTE — Telephone Encounter (Signed)
Pt with OV today  HCTZ refill Last Refill:04/23/17 # 90 1 RF Last OV: 04/23/17 PCP: Roosvelt Maserachel Lane PA Pharmacy:[facility] [address]  Metformin refill Last Refill:08/26/17 # 180 Last OV: 07/22/17  Last HgbA1C: 07/22/17

## 2017-10-22 NOTE — Assessment & Plan Note (Signed)
Rechecking levels today. Call with any concerns. Continue to monitor.  

## 2017-10-23 LAB — COMPREHENSIVE METABOLIC PANEL
ALBUMIN: 4.6 g/dL (ref 3.5–5.5)
ALK PHOS: 80 IU/L (ref 39–117)
ALT: 17 IU/L (ref 0–32)
AST: 14 IU/L (ref 0–40)
Albumin/Globulin Ratio: 2.2 (ref 1.2–2.2)
BUN/Creatinine Ratio: 17 (ref 9–23)
BUN: 14 mg/dL (ref 6–24)
Bilirubin Total: 0.3 mg/dL (ref 0.0–1.2)
CALCIUM: 10.1 mg/dL (ref 8.7–10.2)
CO2: 26 mmol/L (ref 20–29)
CREATININE: 0.83 mg/dL (ref 0.57–1.00)
Chloride: 97 mmol/L (ref 96–106)
GFR calc Af Amer: 98 mL/min/{1.73_m2} (ref >59)
GFR, EST NON AFRICAN AMERICAN: 85 mL/min/{1.73_m2} (ref >59)
GLUCOSE: 85 mg/dL (ref 65–99)
Globulin, Total: 2.1 g/dL (ref 1.5–4.5)
Potassium: 4.4 mmol/L (ref 3.5–5.2)
Sodium: 140 mmol/L (ref 134–144)
Total Protein: 6.7 g/dL (ref 6.0–8.5)

## 2017-10-23 LAB — CBC WITH DIFFERENTIAL/PLATELET
BASOS ABS: 0 10*3/uL (ref 0.0–0.2)
BASOS: 0 %
EOS (ABSOLUTE): 0.1 10*3/uL (ref 0.0–0.4)
Eos: 2 %
HEMATOCRIT: 38.9 % (ref 34.0–46.6)
HEMOGLOBIN: 13 g/dL (ref 11.1–15.9)
Immature Grans (Abs): 0 10*3/uL (ref 0.0–0.1)
Immature Granulocytes: 0 %
LYMPHS ABS: 2.2 10*3/uL (ref 0.7–3.1)
Lymphs: 37 %
MCH: 28.6 pg (ref 26.6–33.0)
MCHC: 33.4 g/dL (ref 31.5–35.7)
MCV: 86 fL (ref 79–97)
Monocytes Absolute: 0.4 10*3/uL (ref 0.1–0.9)
Monocytes: 6 %
NEUTROS ABS: 3.2 10*3/uL (ref 1.4–7.0)
Neutrophils: 55 %
Platelets: 177 10*3/uL (ref 150–450)
RBC: 4.54 x10E6/uL (ref 3.77–5.28)
RDW: 15.7 % — ABNORMAL HIGH (ref 12.3–15.4)
WBC: 5.9 10*3/uL (ref 3.4–10.8)

## 2017-10-23 LAB — IRON AND TIBC
Iron Saturation: 18 % (ref 15–55)
Iron: 61 ug/dL (ref 27–159)
Total Iron Binding Capacity: 331 ug/dL (ref 250–450)
UIBC: 270 ug/dL (ref 131–425)

## 2017-10-23 LAB — LIPID PANEL W/O CHOL/HDL RATIO
CHOLESTEROL TOTAL: 142 mg/dL (ref 100–199)
HDL: 49 mg/dL (ref >39)
LDL Calculated: 51 mg/dL (ref 0–99)
TRIGLYCERIDES: 212 mg/dL — AB (ref 0–149)
VLDL Cholesterol Cal: 42 mg/dL — ABNORMAL HIGH (ref 5–40)

## 2017-10-23 LAB — FERRITIN: Ferritin: 23 ng/mL (ref 15–150)

## 2017-10-23 LAB — VALPROIC ACID LEVEL: VALPROIC ACID LVL: 52 ug/mL (ref 50–100)

## 2017-11-01 ENCOUNTER — Ambulatory Visit: Payer: Medicare Other | Admitting: Psychiatry

## 2017-11-02 ENCOUNTER — Other Ambulatory Visit: Payer: Self-pay

## 2017-11-02 ENCOUNTER — Ambulatory Visit: Payer: Medicare Other

## 2017-11-02 VITALS — BP 120/79 | HR 81 | Temp 97.3°F | Ht 63.0 in | Wt 225.4 lb

## 2017-11-02 DIAGNOSIS — F319 Bipolar disorder, unspecified: Secondary | ICD-10-CM

## 2017-11-02 DIAGNOSIS — F428 Other obsessive-compulsive disorder: Secondary | ICD-10-CM

## 2017-11-02 DIAGNOSIS — F429 Obsessive-compulsive disorder, unspecified: Secondary | ICD-10-CM

## 2017-11-02 DIAGNOSIS — F259 Schizoaffective disorder, unspecified: Secondary | ICD-10-CM

## 2017-11-02 DIAGNOSIS — Z9989 Dependence on other enabling machines and devices: Secondary | ICD-10-CM

## 2017-11-02 DIAGNOSIS — F250 Schizoaffective disorder, bipolar type: Secondary | ICD-10-CM

## 2017-11-02 DIAGNOSIS — F3130 Bipolar disorder, current episode depressed, mild or moderate severity, unspecified: Secondary | ICD-10-CM

## 2017-11-02 DIAGNOSIS — G4733 Obstructive sleep apnea (adult) (pediatric): Secondary | ICD-10-CM

## 2017-11-02 DIAGNOSIS — F3170 Bipolar disorder, currently in remission, most recent episode unspecified: Secondary | ICD-10-CM

## 2017-11-02 DIAGNOSIS — F422 Mixed obsessional thoughts and acts: Secondary | ICD-10-CM

## 2017-11-02 DIAGNOSIS — F251 Schizoaffective disorder, depressive type: Secondary | ICD-10-CM | POA: Diagnosis not present

## 2017-11-02 MED ORDER — PALIPERIDONE PALMITATE ER 156 MG/ML IM SUSY
156.0000 mg | PREFILLED_SYRINGE | Freq: Once | INTRAMUSCULAR | Status: AC
  Administered 2017-11-02: 156 mg via INTRAMUSCULAR

## 2017-11-02 NOTE — Progress Notes (Signed)
Right Upper Outer Quadrant invega 156mg  injection lot [id number] exp 07-2018 lot# [id number] NDC# [id number]  Patient states she is doing well on injection. Patient was pleasant and well dressed.

## 2017-11-15 ENCOUNTER — Ambulatory Visit: Payer: Medicare Other

## 2017-11-17 ENCOUNTER — Other Ambulatory Visit: Payer: Self-pay | Admitting: Psychiatry

## 2017-11-17 DIAGNOSIS — F251 Schizoaffective disorder, depressive type: Secondary | ICD-10-CM

## 2017-11-22 NOTE — Telephone Encounter (Signed)
Pt was last seen on  11-01-17 next appt 11-29-17. Pt needs enough medication to get to appt.    gabapentin (NEURONTIN) 400 MG capsule  Medication  Date: 08/19/2017 Department: [facility] Ordering/Authorizing: Jomarie LongsEappen, Saramma, MD  Order Providers   Prescribing Provider Encounter Provider  Jomarie LongsEappen, Saramma, MD Elvina MattesNorton, Santina Trillo L, CMA  Outpatient Medication Detail    Disp Refills Start End   gabapentin (NEURONTIN) 400 MG capsule 270 capsule 0 08/19/2017    Sig - Route: Take 1 capsule (400 mg total) by mouth 3 (three) times daily. - Oral   Sent to pharmacy as: gabapentin (NEURONTIN) 400 MG capsule   E-Prescribing Status: Receipt confirmed by pharmacy (08/19/2017 11:13 AM EDT

## 2017-11-29 ENCOUNTER — Telehealth: Payer: Self-pay | Admitting: Psychiatry

## 2017-11-29 ENCOUNTER — Ambulatory Visit (INDEPENDENT_AMBULATORY_CARE_PROVIDER_SITE_OTHER): Payer: Medicare Other

## 2017-11-29 ENCOUNTER — Other Ambulatory Visit: Payer: Self-pay

## 2017-11-29 ENCOUNTER — Ambulatory Visit (INDEPENDENT_AMBULATORY_CARE_PROVIDER_SITE_OTHER): Payer: Medicare Other | Admitting: Psychiatry

## 2017-11-29 ENCOUNTER — Encounter: Payer: Self-pay | Admitting: Psychiatry

## 2017-11-29 ENCOUNTER — Other Ambulatory Visit: Payer: Self-pay | Admitting: Psychiatry

## 2017-11-29 VITALS — BP 116/70 | HR 78 | Temp 98.4°F | Ht 63.0 in | Wt 229.2 lb

## 2017-11-29 VITALS — BP 116/70 | HR 78 | Temp 98.4°F | Wt 229.2 lb

## 2017-11-29 DIAGNOSIS — F429 Obsessive-compulsive disorder, unspecified: Secondary | ICD-10-CM

## 2017-11-29 DIAGNOSIS — F259 Schizoaffective disorder, unspecified: Secondary | ICD-10-CM

## 2017-11-29 DIAGNOSIS — F251 Schizoaffective disorder, depressive type: Secondary | ICD-10-CM | POA: Diagnosis not present

## 2017-11-29 DIAGNOSIS — F3170 Bipolar disorder, currently in remission, most recent episode unspecified: Secondary | ICD-10-CM

## 2017-11-29 MED ORDER — PALIPERIDONE PALMITATE ER 156 MG/ML IM SUSY
156.0000 mg | PREFILLED_SYRINGE | Freq: Once | INTRAMUSCULAR | Status: AC
  Administered 2017-11-29: 156 mg via INTRAMUSCULAR

## 2017-11-29 MED ORDER — PALIPERIDONE PALMITATE ER 156 MG/ML IM SUSY: 156.0000 mg | PREFILLED_SYRINGE | Freq: Once | INTRAMUSCULAR | 6 refills | Status: DC

## 2017-11-29 MED ORDER — PALIPERIDONE PALMITATE 156 MG/ML IM SUSP: 156.0000 mg | Freq: Once | INTRAMUSCULAR | Status: DC

## 2017-11-29 NOTE — Progress Notes (Signed)
BH MD OP Progress Note  11/29/2017 3:47 PM Tabitha HouseholderKathleen J [name]  MRN:  [id number]  Chief Complaint: Follow-up  Chief Complaint    Follow-up; Medication Refill     HPI: Tabitha JohnsKathleen is a 47 year old married, Caucasian female, who is on SSD, lives in [place], presented to the clinic today for a follow-up visit.  Patient has a history of schizoaffective disorder as well as unspecified OCD symptoms.  Pt reported that she has noticed improvement in the symptoms as she was started on the [place] Sustenna presented with her family as she reported that she has started homeschooling her children and they are in the fourth and seventh grade.  She reported that her husband is also on disability and he also stays at home.  She reported that she has baseline paranoia and it is improving with the help of her monthly injection.  She does not have any side effects of the medications.  She appears calm and alert during the interview.  She has also lost 15 pounds as she is trying to lose weight.  She reported that her blood sugar is also under control.  She appeared calm and alert during the interview.  She denied having any suicidal homicidal ideations or plans.    She appears alert and oriented during the interview.     Visit Diagnosis:    ICD-10-CM   1. Schizoaffective disorder, depressive type (HCC) F25.1   2. Obsessive-compulsive disorder, unspecified type F42.9     Past Psychiatric History: History of schizoaffective disorder, OCD.  She used to follow up with [facility] [place] in the past and then with Dr. Garnetta BuddyFaheem.  She denies any suicide attempts.  Denies any inpatient mental health admissions.  Past Medical History:  Past Medical History:  Diagnosis Date  . Allergic rhinitis   . Anxiety   . Asthma   . Bipolar disorder (HCC)   . Depression   . Diabetes mellitus without complication (HCC)   . GERD (gastroesophageal reflux disease)   . Hyperlipidemia   . Mild sleep apnea   . OCD (obsessive  compulsive disorder)   . Palpitations   . Paranoia (HCC)   . Schizoaffective disorder (HCC)   . Sleep apnea     Past Surgical History:  Procedure Laterality Date  . CESAREAN SECTION  2006/2008  . CHOLECYSTECTOMY  2006    Family Psychiatric History: Denies.  Family History:  Family History  Problem Relation Age of Onset  . Diabetes Father   . Hyperlipidemia Father   . Diabetes Brother   . Lupus Mother   . Diabetes Mother        pre diabetes   Substance abuse history: Denies   Social History: Married, lives with her husband.  She is on SSD.  Her husband is also disabled.  He is a double amputee, from a past history of abdominal gangrene which spread.  She has 2 children aged 46 and 46 years old.  They are home schooled at this time. Social History   Socioeconomic History  . Marital status: Married    Spouse name: kevin  . Number of children: 2  . Years of education: Not on file  . Highest education level: Bachelor's degree (e.g., BA, AB, BS)  Occupational History  . Not on file  Social Needs  . Financial resource strain: Not hard at all  . Food insecurity:    Worry: Sometimes true    Inability: Sometimes true  . Transportation needs:    Medical:  No    Non-medical: No  Tobacco Use  . Smoking status: Never Smoker  . Smokeless tobacco: Never Used  Substance and Sexual Activity  . Alcohol use: No    Alcohol/week: 0.0 oz  . Drug use: No  . Sexual activity: Yes    Partners: Male    Birth control/protection: None, Surgical  Lifestyle  . Physical activity:    Days per week: 0 days    Minutes per session: 0 min  . Stress: To some extent  Relationships  . Social connections:    Talks on phone: More than three times a week    Gets together: More than three times a week    Attends religious service: More than 4 times per year    Active member of club or organization: Yes    Attends meetings of clubs or organizations: More than 4 times per year    Relationship  status: Married  Other Topics Concern  . Not on file  Social History Narrative  . Not on file    Allergies:  Allergies  Allergen Reactions  . Abilify [Aripiprazole] Hives    Metabolic Disorder Labs: Lab Results  Component Value Date   HGBA1C 7.0 (H) 07/22/2017   Lab Results  Component Value Date   PROLACTIN 84.8 (H) 06/01/2017   PROLACTIN 36.2 (H) 03/26/2016   Lab Results  Component Value Date   CHOL 142 10/22/2017   TRIG 212 (H) 10/22/2017   HDL 49 10/22/2017   CHOLHDL 2.9 09/26/2015   VLDL 51 (H) 04/23/2017   LDLCALC 51 10/22/2017   LDLCALC 63 03/26/2016   Lab Results  Component Value Date   TSH 2.570 06/01/2017   TSH 2.500 03/26/2016    Therapeutic Level Labs: No results found for: LITHIUM Lab Results  Component Value Date   VALPROATE 52 10/22/2017   VALPROATE 43 (L) 06/01/2017   No components found for:  CBMZ  Current Medications: Current Outpatient Medications  Medication Sig Dispense Refill  . albuterol (PROAIR HFA) 108 (90 Base) MCG/ACT inhaler Inhale 2 puffs into the lungs every 6 (six) hours as needed for wheezing or shortness of breath. 8.5 g 6  . amantadine (SYMMETREL) 100 MG capsule Take 1 capsule (100 mg total) by mouth 2 (two) times daily. 60 capsule 2  . atorvastatin (LIPITOR) 20 MG tablet Take 1 tablet (20 mg total) by mouth at bedtime. 90 tablet 1  . benzonatate (TESSALON) 200 MG capsule TAKE 1 CAPSULE(200 MG) BY MOUTH TWICE DAILY AS NEEDED FOR COUGH 30 capsule 0  . divalproex (DEPAKOTE) 250 MG DR tablet Take 1 tablet (250 mg total) by mouth every morning. 90 tablet 0  . divalproex (DEPAKOTE) 500 MG DR tablet Take 1 tablet (500 mg total) by mouth every evening. 90 tablet 0  . doxycycline (VIBRAMYCIN) 50 MG capsule Take 1 capsule (50 mg total) by mouth every morning. 90 capsule 1  . empagliflozin (JARDIANCE) 25 MG TABS tablet Take 25 mg by mouth daily. 90 tablet 1  . fluticasone (FLOVENT HFA) 110 MCG/ACT inhaler Inhale 1 puff into the lungs 2  (two) times daily. 1 Inhaler 11  . gabapentin (NEURONTIN) 400 MG capsule TAKE 1 CAPSULE(400 MG) BY MOUTH THREE TIMES DAILY 270 capsule 0  . glucose blood (ACCU-CHEK AVIVA PLUS) test strip 1 each by Other route as needed for other. Use as instructed 100 each 12  . hydrochlorothiazide (HYDRODIURIL) 12.5 MG tablet TAKE 1 TABLET(12.5 MG) BY MOUTH DAILY 90 tablet 1  . Insulin Detemir (  LEVEMIR) 100 UNIT/ML Pen Inject 40 Units into the skin 2 (two) times daily. 5 pen 11  . Insulin Pen Needle (BD PEN NEEDLE NANO U/F) 32G X 4 MM MISC 1 each by Does not apply route daily. 100 each 12  . metFORMIN (GLUCOPHAGE) 500 MG tablet TAKE 2 TABLETS BY MOUTH TWICE DAILY BEFORE A MEAL 180 tablet 1  . NOVOTWIST 32G X 5 MM MISC USE WITH LEVEMIR PEN BID  5  . omeprazole (PRILOSEC) 10 MG capsule Take 1 capsule (10 mg total) by mouth daily. 90 capsule 1  . paliperidone (INVEGA) 6 MG 24 hr tablet Take 1 tablet (6 mg total) by mouth daily. 30 tablet 2  . pioglitazone (ACTOS) 45 MG tablet Take 1 tablet (45 mg total) by mouth daily. 90 tablet 1  . Vilazodone HCl (VIIBRYD) 40 MG TABS Take 1 tablet (40 mg total) by mouth every morning. 90 tablet 0  . paliperidone (INVEGA SUSTENNA) 156 MG/ML SUSY injection Inject 1 mL (156 mg total) into the muscle once for 1 dose. 1 mL 6   No current facility-administered medications for this visit.      Musculoskeletal: Strength & Muscle Tone: within normal limits Gait & Station: normal Patient leans: N/A  Psychiatric Specialty Exam: Review of Systems  Psychiatric/Behavioral: The patient is nervous/anxious.   All other systems reviewed and are negative.   Blood pressure 116/70, pulse 78, temperature 98.4 F (36.9 C), temperature source Oral, weight 229 lb 3.2 oz (104 kg).Body mass index is 40.6 kg/m.  General Appearance: Casual  Eye Contact:  Fair  Speech:  Normal Rate  Volume:  Normal  Mood:  Anxious  Affect:  Congruent  Thought Process:  Goal Directed and Descriptions of  Associations: Intact  Orientation:  Full (Time, Place, and Person)  Thought Content: Delusions and Paranoid Ideation , chronic , on and off, able to cope  Suicidal Thoughts:  No  Homicidal Thoughts:  No  Memory:  Immediate;   Fair Recent;   Fair Remote;   Fair  Judgement:  Fair  Insight:  Fair  Psychomotor Activity:  Normal  Concentration:  Concentration: Fair and Attention Span: Fair  Recall:  [employer] of Knowledge: Fair  Language: Fair  Akathisia:  No  Handed:  Right  AIMS (if indicated): 0  Assets:  Communication Skills Desire for Improvement Housing Intimacy Social Support Talents/Skills  ADL's:  Intact  Cognition: WNL  Sleep:  Fair   Screenings: AIMS     Office Visit from 05/12/2017 in [facility] Office Visit from 07/29/2016 in [facility] Office Visit from 05/06/2016 in [facility] Office Visit from 04/01/2016 in [facility] Office Visit from 03/28/2015 in [facility]  AIMS Total Score  0  0  0  0  0    PHQ2-9     Clinical Support from 01/13/2017 in [facility] Office Visit from 09/24/2015 in [facility] Office Visit from 06/20/2015 in [facility] Office Visit from 03/15/2015 in [facility] Office Visit from 01/08/2015 in [facility]  PHQ-2 Total Score  2  3  0  0  4  PHQ-9 Total Score  12  8  -  -  12       Assessment and Plan: Alisi is a 47 year old Caucasian female who has a history of schizoaffective disorder, anxiety disorder, insomnia, OSA on CPAP who presented to the clinic today  for a follow-up visit.     Plan For schizoaffective disorder Invega sustenna 156 mg IM q. 28 days.   She will be given [place] injection today.    For anxiety symptoms Continue Viibryd 40 mg p.o. daily Continue gabapentin 400 mg p.o. 3 times  daily.  She also takes it for neuropathy.   We will start her on Invega oral 6 mg p.o. daily as she has responded well in the past.    Continue Depakote as prescribed  Patient has supply of the medications and no medications refilled at this time.   Follow up in clinic in 4 weeks or sooner if needed.  More than 50 % of the time was spent for psychoeducation and supportive psychotherapy and care coordination.  This note was generated in part or whole with voice recognition software. Voice recognition is usually quite accurate but there are transcription errors that can and very often do occur. I apologize for any typographical errors that were not detected and corrected.        Brandy Hale, MD 11/29/2017, 3:47 PM

## 2017-11-29 NOTE — Progress Notes (Signed)
Left Upper Outer Quadrant invega 156mg  injection  lot# [id number] s/n# [id number] exp 01-2019 NDC# [id number].   Patient states she is doing good as long as she takes her medication.

## 2017-11-29 NOTE — Telephone Encounter (Signed)
RECEIVED REFILL REQUEST , HOWEVER DR.URMANIA HAS ALREADY FILLED GABAPENTIN .

## 2018-01-03 ENCOUNTER — Other Ambulatory Visit: Payer: Self-pay

## 2018-01-03 ENCOUNTER — Ambulatory Visit: Payer: Medicare Other | Admitting: Psychiatry

## 2018-01-03 ENCOUNTER — Encounter: Payer: Self-pay | Admitting: Psychiatry

## 2018-01-03 ENCOUNTER — Ambulatory Visit (INDEPENDENT_AMBULATORY_CARE_PROVIDER_SITE_OTHER): Payer: Medicare Other

## 2018-01-03 VITALS — BP 115/77 | HR 82 | Temp 97.4°F | Ht 63.0 in | Wt 225.2 lb

## 2018-01-03 VITALS — BP 115/77 | HR 82 | Temp 97.4°F | Wt 225.2 lb

## 2018-01-03 DIAGNOSIS — F259 Schizoaffective disorder, unspecified: Secondary | ICD-10-CM | POA: Diagnosis not present

## 2018-01-03 DIAGNOSIS — F3170 Bipolar disorder, currently in remission, most recent episode unspecified: Secondary | ICD-10-CM

## 2018-01-03 DIAGNOSIS — F429 Obsessive-compulsive disorder, unspecified: Secondary | ICD-10-CM

## 2018-01-03 DIAGNOSIS — F251 Schizoaffective disorder, depressive type: Secondary | ICD-10-CM | POA: Diagnosis not present

## 2018-01-03 MED ORDER — GABAPENTIN 400 MG PO CAPS: 400.0000 mg | ORAL_CAPSULE | Freq: Three times a day (TID) | ORAL | 3 refills | Status: DC

## 2018-01-03 MED ORDER — PALIPERIDONE ER 6 MG PO TB24: 6.0000 mg | ORAL_TABLET | Freq: Every day | ORAL | 6 refills | Status: DC

## 2018-01-03 MED ORDER — AMANTADINE HCL 100 MG PO CAPS: 100.0000 mg | ORAL_CAPSULE | Freq: Two times a day (BID) | ORAL | 6 refills | Status: DC

## 2018-01-03 MED ORDER — DIVALPROEX SODIUM 250 MG PO DR TAB: 250.0000 mg | DELAYED_RELEASE_TABLET | Freq: Every morning | ORAL | 0 refills | Status: DC

## 2018-01-03 MED ORDER — DIVALPROEX SODIUM 500 MG PO DR TAB: 500.0000 mg | DELAYED_RELEASE_TABLET | Freq: Every evening | ORAL | 0 refills | Status: DC

## 2018-01-03 MED ORDER — PALIPERIDONE PALMITATE 156 MG/ML IM SUSP: 156.0000 mg | Freq: Once | INTRAMUSCULAR | Status: DC

## 2018-01-03 MED ORDER — VILAZODONE HCL 40 MG PO TABS: 40.0000 mg | ORAL_TABLET | Freq: Every morning | ORAL | 0 refills | Status: DC

## 2018-01-03 NOTE — Progress Notes (Signed)
BH MD OP Progress Note  01/03/2018 3:19 PM Tabitha [name]  MRN:  [id number]  Chief Complaint: Follow-up  Chief Complaint    Follow-up; Medication Refill     HPI: Tabitha [name] is a 47 year old married, Caucasian female, who is on SSD, lives in [place], presented to the clinic today for a follow-up visit.  Patient has a history of schizoaffective disorder as well as unspecified OCD symptoms.  Pt reported that she been paranoia last month especially delusional thinking that people were following her.  However they improved after 1-2 weeks later.  Patient reported that now she is feeling better.  She has been compliant with her medication.  She is busy with her children as a started school again and she has been doing home schooling.  Patient reported that she lives with her family and they are supportive.  Patient currently denied having any auditory or visual hallucinations.  She does not have any side effects of the [place] to get the injection on a monthly basis.  She appears calm and alert during the interview.  No acute side effects of the medications noted at this time.     She appears alert and oriented during the interview.     Visit Diagnosis:    ICD-10-CM   1. Schizoaffective disorder, unspecified type (HCC) F25.9   2. Obsessive-compulsive disorder, unspecified type F42.9     Past Psychiatric History: History of schizoaffective disorder, OCD.  She used to follow up with [facility] in [place] in the past and then with Dr. Garnetta Buddy.  She denies any suicide attempts.  Denies any inpatient mental health admissions.  Past Medical History:  Past Medical History:  Diagnosis Date  . Allergic rhinitis   . Anxiety   . Asthma   . Bipolar disorder (HCC)   . Depression   . Diabetes mellitus without complication (HCC)   . GERD (gastroesophageal reflux disease)   . Hyperlipidemia   . Mild sleep apnea   . OCD (obsessive compulsive disorder)   . Palpitations   . Paranoia (HCC)    . Schizoaffective disorder (HCC)   . Sleep apnea     Past Surgical History:  Procedure Laterality Date  . CESAREAN SECTION  2006/2008  . CHOLECYSTECTOMY  2006    Family Psychiatric History: Denies.  Family History:  Family History  Problem Relation Age of Onset  . Diabetes Father   . Hyperlipidemia Father   . Diabetes Brother   . Lupus Mother   . Diabetes Mother        pre diabetes   Substance abuse history: Denies   Social History: Married, lives with her husband.  She is on SSD.  Her husband is also disabled.  He is a double amputee, from a past history of abdominal gangrene which spread.  She has 2 children aged 68 and 17 years old.  They are home schooled at this time. Social History   Socioeconomic History  . Marital status: Married    Spouse name: kevin  . Number of children: 2  . Years of education: Not on file  . Highest education level: Bachelor's degree (e.g., BA, AB, BS)  Occupational History  . Not on file  Social Needs  . Financial resource strain: Not hard at all  . Food insecurity:    Worry: Sometimes true    Inability: Sometimes true  . Transportation needs:    Medical: No    Non-medical: No  Tobacco Use  . Smoking status: Never Smoker  .  Smokeless tobacco: Never Used  Substance and Sexual Activity  . Alcohol use: No    Alcohol/week: 0.0 standard drinks  . Drug use: No  . Sexual activity: Yes    Partners: Male    Birth control/protection: None, Surgical  Lifestyle  . Physical activity:    Days per week: 0 days    Minutes per session: 0 min  . Stress: To some extent  Relationships  . Social connections:    Talks on phone: More than three times a week    Gets together: More than three times a week    Attends religious service: More than 4 times per year    Active member of club or organization: Yes    Attends meetings of clubs or organizations: More than 4 times per year    Relationship status: Married  Other Topics Concern  . Not on  file  Social History Narrative  . Not on file    Allergies:  Allergies  Allergen Reactions  . Abilify [Aripiprazole] Hives    Metabolic Disorder Labs: Lab Results  Component Value Date   HGBA1C 7.0 (H) 07/22/2017   Lab Results  Component Value Date   PROLACTIN 84.8 (H) 06/01/2017   PROLACTIN 36.2 (H) 03/26/2016   Lab Results  Component Value Date   CHOL 142 10/22/2017   TRIG 212 (H) 10/22/2017   HDL 49 10/22/2017   CHOLHDL 2.9 09/26/2015   VLDL 51 (H) 04/23/2017   LDLCALC 51 10/22/2017   LDLCALC 63 03/26/2016   Lab Results  Component Value Date   TSH 2.570 06/01/2017   TSH 2.500 03/26/2016    Therapeutic Level Labs: No results found for: LITHIUM Lab Results  Component Value Date   VALPROATE 52 10/22/2017   VALPROATE 43 (L) 06/01/2017   No components found for:  CBMZ  Current Medications: Current Outpatient Medications  Medication Sig Dispense Refill  . albuterol (PROAIR HFA) 108 (90 Base) MCG/ACT inhaler Inhale 2 puffs into the lungs every 6 (six) hours as needed for wheezing or shortness of breath. 8.5 g 6  . amantadine (SYMMETREL) 100 MG capsule Take 1 capsule (100 mg total) by mouth 2 (two) times daily. 60 capsule 2  . atorvastatin (LIPITOR) 20 MG tablet Take 1 tablet (20 mg total) by mouth at bedtime. 90 tablet 1  . benzonatate (TESSALON) 200 MG capsule TAKE 1 CAPSULE(200 MG) BY MOUTH TWICE DAILY AS NEEDED FOR COUGH 30 capsule 0  . divalproex (DEPAKOTE) 250 MG DR tablet Take 1 tablet (250 mg total) by mouth every morning. 90 tablet 0  . divalproex (DEPAKOTE) 500 MG DR tablet Take 1 tablet (500 mg total) by mouth every evening. 90 tablet 0  . doxycycline (VIBRAMYCIN) 50 MG capsule Take 1 capsule (50 mg total) by mouth every morning. 90 capsule 1  . empagliflozin (JARDIANCE) 25 MG TABS tablet Take 25 mg by mouth daily. 90 tablet 1  . fluticasone (FLOVENT HFA) 110 MCG/ACT inhaler Inhale 1 puff into the lungs 2 (two) times daily. 1 Inhaler 11  . gabapentin  (NEURONTIN) 400 MG capsule TAKE 1 CAPSULE(400 MG) BY MOUTH THREE TIMES DAILY 270 capsule 0  . glucose blood (ACCU-CHEK AVIVA PLUS) test strip 1 each by Other route as needed for other. Use as instructed 100 each 12  . hydrochlorothiazide (HYDRODIURIL) 12.5 MG tablet TAKE 1 TABLET(12.5 MG) BY MOUTH DAILY 90 tablet 1  . Insulin Detemir (LEVEMIR) 100 UNIT/ML Pen Inject 40 Units into the skin 2 (two) times daily. 5 pen  11  . Insulin Pen Needle (BD PEN NEEDLE NANO U/F) 32G X 4 MM MISC 1 each by Does not apply route daily. 100 each 12  . metFORMIN (GLUCOPHAGE) 500 MG tablet TAKE 2 TABLETS BY MOUTH TWICE DAILY BEFORE A MEAL 180 tablet 1  . NOVOTWIST 32G X 5 MM MISC USE WITH LEVEMIR PEN BID  5  . omeprazole (PRILOSEC) 10 MG capsule Take 1 capsule (10 mg total) by mouth daily. 90 capsule 1  . paliperidone (INVEGA) 6 MG 24 hr tablet Take 1 tablet (6 mg total) by mouth daily. 30 tablet 2  . pioglitazone (ACTOS) 45 MG tablet Take 1 tablet (45 mg total) by mouth daily. 90 tablet 1  . Vilazodone HCl (VIIBRYD) 40 MG TABS Take 1 tablet (40 mg total) by mouth every morning. 90 tablet 0  . paliperidone (INVEGA SUSTENNA) 156 MG/ML SUSY injection Inject 1 mL (156 mg total) into the muscle once for 1 dose. 1 mL 6   Current Facility-Administered Medications  Medication Dose Route Frequency Provider Last Rate Last Dose  . paliperidone (INVEGA SUSTENNA) injection 156 mg  156 mg Intramuscular Once Brandy Hale, MD         Musculoskeletal: Strength & Muscle Tone: within normal limits Gait & Station: normal Patient leans: N/A  Psychiatric Specialty Exam: Review of Systems  Psychiatric/Behavioral: The patient is nervous/anxious.   All other systems reviewed and are negative.   Blood pressure 115/77, pulse 82, temperature (!) 97.4 F (36.3 C), temperature source Oral, weight 225 lb 3.2 oz (102.2 kg).Body mass index is 39.89 kg/m.  General Appearance: Casual  Eye Contact:  Fair  Speech:  Normal Rate  Volume:   Normal  Mood:  Anxious  Affect:  Congruent  Thought Process:  Goal Directed and Descriptions of Associations: Intact  Orientation:  Full (Time, Place, and Person)  Thought Content: Delusions and Paranoid Ideation , chronic , on and off, able to cope  Suicidal Thoughts:  No  Homicidal Thoughts:  No  Memory:  Immediate;   Fair Recent;   Fair Remote;   Fair  Judgement:  Fair  Insight:  Fair  Psychomotor Activity:  Normal  Concentration:  Concentration: Fair and Attention Span: Fair  Recall:  [employer] of Knowledge: Fair  Language: Fair  Akathisia:  No  Handed:  Right  AIMS (if indicated): 0  Assets:  Communication Skills Desire for Improvement Housing Intimacy Social Support Talents/Skills  ADL's:  Intact  Cognition: WNL  Sleep:  Fair   Screenings: AIMS     Office Visit from [facility]/12/2017 in [facility] Office Visit from 07/29/2016 in [facility] Office Visit from 05/06/2016 in [facility] Office Visit from 04/01/2016 in [facility] Office Visit from 03/28/2015 in [facility]  AIMS Total Score  0  0  0  0  0    PHQ2-9     Clinical Support from 01/13/2017 in [facility] Office Visit from 09/24/2015 in [facility] Office Visit from 06/20/2015 in [facility] Office Visit from 03/15/2015 in [facility] Office Visit from 01/08/2015 in [facility]  PHQ-2 Total Score  2  3  0  0  4  PHQ-9 Total Score  12  8  -  -  12       Assessment and Plan: Nakari is a 47 year old Caucasian female who has a history of schizoaffective disorder, anxiety disorder,  insomnia, OSA on CPAP who presented to the clinic today for a follow-up visit.     Plan For schizoaffective disorder Invega sustenna 156 mg IM q. 28 days.   She will be given [place] injection today.     For anxiety symptoms Continue Viibryd 40 mg p.o. daily Continue gabapentin 400 mg p.o. 3 times daily.  She also takes it for neuropathy.   We will start her on Invega oral 6 mg p.o. daily as she has responded well in the past.    Continue Depakote as prescribed  Medications  refilled  Follow up in clinic in 4 weeks or sooner if needed.  More than 50 % of the time was spent for psychoeducation and supportive psychotherapy and care coordination.  This note was generated in part or whole with voice recognition software. Voice recognition is usually quite accurate but there are transcription errors that can and very often do occur. I apologize for any typographical errors that were not detected and corrected.        Brandy Hale, MD 01/03/2018, 3:19 PM

## 2018-01-03 NOTE — Progress Notes (Signed)
Right Upper Outer Quadrant invega 156mg  injection  lot#[id number] s/n# [id number]  exp 04-2019 NDC# 04599-774-14.    Patient states she is doing good. Patient talked and seem to be in good mood.

## 2018-01-04 ENCOUNTER — Other Ambulatory Visit: Payer: Self-pay | Admitting: Psychiatry

## 2018-01-04 ENCOUNTER — Other Ambulatory Visit: Payer: Self-pay | Admitting: Family Medicine

## 2018-01-04 DIAGNOSIS — Z794 Long term (current) use of insulin: Secondary | ICD-10-CM

## 2018-01-04 DIAGNOSIS — E119 Type 2 diabetes mellitus without complications: Secondary | ICD-10-CM

## 2018-01-04 DIAGNOSIS — F251 Schizoaffective disorder, depressive type: Secondary | ICD-10-CM

## 2018-01-13 ENCOUNTER — Ambulatory Visit (INDEPENDENT_AMBULATORY_CARE_PROVIDER_SITE_OTHER): Payer: Medicare Other

## 2018-01-13 VITALS — BP 130/68 | HR 88 | Temp 97.6°F | Resp 16 | Ht 62.5 in | Wt 224.9 lb

## 2018-01-13 DIAGNOSIS — Z0000 Encounter for general adult medical examination without abnormal findings: Secondary | ICD-10-CM | POA: Diagnosis not present

## 2018-01-13 DIAGNOSIS — Z23 Encounter for immunization: Secondary | ICD-10-CM | POA: Diagnosis not present

## 2018-01-13 NOTE — Progress Notes (Signed)
Subjective:   TIAWANNA [name] is a 47 y.o. female who presents for Medicare Annual (Subsequent) preventive examination.  Review of Systems:   Cardiac Risk Factors include: dyslipidemia;diabetes mellitus;obesity (BMI >30kg/m2)     Objective:     Vitals: BP 130/68 (BP Location: Left Arm, Patient Position: Sitting)   Pulse 88   Temp 97.6 F (36.4 C) (Temporal)   Resp 16   Ht 5' 2.5" (1.588 m)   Wt 224 lb 14.4 oz (102 kg)   BMI 40.48 kg/m   Body mass index is 40.48 kg/m.  Advanced Directives 01/13/2018 01/13/2017  Does Patient Have a Medical Advance Directive? No No  Would patient like information on creating a medical advance directive? Yes (MAU/Ambulatory/Procedural Areas - Information given) Yes (MAU/Ambulatory/Procedural Areas - Information given)  Some encounter information is confidential and restricted. Go to Review Flowsheets activity to see all data.    Tobacco Social History   Tobacco Use  Smoking Status Never Smoker  Smokeless Tobacco Never Used     Counseling given: Not Answered   Clinical Intake:  Pre-visit preparation completed: Yes  Pain : No/denies pain     Nutritional Status: BMI > 30  Obese Nutritional Risks: None Diabetes: Yes CBG done?: No Did pt. bring in CBG monitor from home?: No  How often do you need to have someone help you when you read instructions, pamphlets, or other written materials from your doctor or pharmacy?: 1 - Never What is the last grade level you completed in school?: bachelors  Interpreter Needed?: No  Information entered by :: Hopelynn Gartland,lPN   Past Medical History:  Diagnosis Date  . Allergic rhinitis   . Anxiety   . Asthma   . Bipolar disorder (HCC)   . Depression   . Diabetes mellitus without complication (HCC)   . GERD (gastroesophageal reflux disease)   . Hyperlipidemia   . Mild sleep apnea   . OCD (obsessive compulsive disorder)   . Palpitations   . Paranoia (HCC)   . Schizoaffective disorder  (HCC)   . Sleep apnea    Past Surgical History:  Procedure Laterality Date  . CESAREAN SECTION  2006/2008  . CHOLECYSTECTOMY  2006   Family History  Problem Relation Age of Onset  . Diabetes Father   . Hyperlipidemia Father   . Diabetes Brother   . Lupus Mother   . Diabetes Mother        pre diabetes   Social History   Socioeconomic History  . Marital status: Married    Spouse name: kevin  . Number of children: 2  . Years of education: Not on file  . Highest education level: Bachelor's degree (e.g., BA, AB, BS)  Occupational History  . Not on file  Social Needs  . Financial resource strain: Not hard at all  . Food insecurity:    Worry: Sometimes true    Inability: Sometimes true  . Transportation needs:    Medical: No    Non-medical: No  Tobacco Use  . Smoking status: Never Smoker  . Smokeless tobacco: Never Used  Substance and Sexual Activity  . Alcohol use: No    Alcohol/week: 0.0 standard drinks  . Drug use: No  . Sexual activity: Yes    Partners: Male    Birth control/protection: None, Surgical  Lifestyle  . Physical activity:    Days per week: 0 days    Minutes per session: 0 min  . Stress: To some extent  Relationships  .  Social connections:    Talks on phone: More than three times a week    Gets together: More than three times a week    Attends religious service: More than 4 times per year    Active member of club or organization: Yes    Attends meetings of clubs or organizations: More than 4 times per year    Relationship status: Married  Other Topics Concern  . Not on file  Social History Narrative  . Not on file    Outpatient Encounter Medications as of 01/13/2018  Medication Sig  . albuterol (PROAIR HFA) 108 (90 Base) MCG/ACT inhaler Inhale 2 puffs into the lungs every 6 (six) hours as needed for wheezing or shortness of breath.  Marland Kitchen amantadine (SYMMETREL) 100 MG capsule Take 1 capsule (100 mg total) by mouth 2 (two) times daily.  Marland Kitchen  atorvastatin (LIPITOR) 20 MG tablet Take 1 tablet (20 mg total) by mouth at bedtime.  . divalproex (DEPAKOTE) 250 MG DR tablet Take 1 tablet (250 mg total) by mouth every morning.  . divalproex (DEPAKOTE) 500 MG DR tablet Take 1 tablet (500 mg total) by mouth every evening.  Marland Kitchen doxycycline (VIBRAMYCIN) 50 MG capsule Take 1 capsule (50 mg total) by mouth every morning.  . empagliflozin (JARDIANCE) 25 MG TABS tablet Take 25 mg by mouth daily.  . fluticasone (FLOVENT HFA) 110 MCG/ACT inhaler Inhale 1 puff into the lungs 2 (two) times daily.  Marland Kitchen gabapentin (NEURONTIN) 400 MG capsule Take 1 capsule (400 mg total) by mouth 3 (three) times daily.  Marland Kitchen glucose blood (ACCU-CHEK AVIVA PLUS) test strip 1 each by Other route as needed for other. Use as instructed  . hydrochlorothiazide (HYDRODIURIL) 12.5 MG tablet TAKE 1 TABLET(12.5 MG) BY MOUTH DAILY  . Insulin Detemir (LEVEMIR) 100 UNIT/ML Pen Inject 40 Units into the skin 2 (two) times daily.  . Insulin Pen Needle (BD PEN NEEDLE NANO U/F) 32G X 4 MM MISC 1 each by Does not apply route daily.  . metFORMIN (GLUCOPHAGE) 500 MG tablet TAKE 2 TABLETS BY MOUTH TWICE DAILY BEFORE A MEAL  . NOVOTWIST 32G X 5 MM MISC USE WITH LEVEMIR PEN BID  . omeprazole (PRILOSEC) 10 MG capsule Take 1 capsule (10 mg total) by mouth daily.  . paliperidone (INVEGA) 6 MG 24 hr tablet Take 1 tablet (6 mg total) by mouth daily.  . pioglitazone (ACTOS) 45 MG tablet Take 1 tablet (45 mg total) by mouth daily.  . Vilazodone HCl (VIIBRYD) 40 MG TABS Take 1 tablet (40 mg total) by mouth every morning.  . benzonatate (TESSALON) 200 MG capsule TAKE 1 CAPSULE(200 MG) BY MOUTH TWICE DAILY AS NEEDED FOR COUGH (Patient not taking: Reported on 01/13/2018)  . paliperidone (INVEGA SUSTENNA) 156 MG/ML SUSY injection Inject 1 mL (156 mg total) into the muscle once for 1 dose.  . pioglitazone (ACTOS) 45 MG tablet TAKE 1 TABLET BY MOUTH DAILY   Facility-Administered Encounter Medications as of 01/13/2018    Medication  . paliperidone (INVEGA SUSTENNA) injection 156 mg    Activities of Daily Living In your present state of health, do you have any difficulty performing the following activities: 01/13/2018 01/13/2017  Hearing? Y Y  Comment hard with background noise -  Vision? N N  Difficulty concentrating or making decisions? Malvin Johns  Walking or climbing stairs? Y N  Comment pain in knees  -  Dressing or bathing? N N  Doing errands, shopping? N N  Preparing Food and eating ?  N N  Using the Toilet? N N  In the past six months, have you accidently leaked urine? Malvin Johns  Comment wears protection  wears pads  Do you have problems with loss of bowel control? N N  Managing your Medications? N N  Managing your Finances? N N  Housekeeping or managing your Housekeeping? N N  Some recent data might be hidden    Patient Care Team: Particia Nearing, PA-C as PCP - General (Family Medicine) Brandy Hale, MD as Referring Physician (Psychiatry)    Assessment:   This is a routine wellness examination for Tabitha [name].  Exercise Activities and Dietary recommendations Current Exercise Habits: The patient does not participate in regular exercise at present, Exercise limited by: None identified  Goals    . DIET - INCREASE WATER INTAKE     Recommend drinking at least 6-8 glasses of water a day        Fall Risk Fall Risk  01/13/2018 01/13/2017 09/24/2015 06/20/2015 03/15/2015  Falls in the past year? No No No No No   Is the patient's home free of loose throw rugs in walkways, pet beds, electrical cords, etc?   yes      Grab bars in the bathroom? no      Handrails on the stairs?   yes      Adequate lighting?   yes  Timed Get Up and Go performed: Completed in 8 seconds with no use of assistive devices, steady gait. No intervention needed at this time.   Depression Screen PHQ 2/9 Scores 01/13/2018 01/13/2017 09/24/2015 06/20/2015  PHQ - 2 Score 2 2 3  0  PHQ- 9 Score 8 12 8  -  Some encounter information  is confidential and restricted. Go to Review Flowsheets activity to see all data.     Cognitive Function     6CIT Screen 01/13/2018 01/13/2017  What Year? 0 points 0 points  What month? 0 points 0 points  What time? 0 points 0 points  Count back from 20 0 points 0 points  Months in reverse 0 points 0 points  Repeat phrase 0 points 4 points  Total Score 0 4    Immunization History  Administered Date(s) Administered  . Influenza,inj,Quad PF,6+ Mos 01/16/2016, 01/13/2017, 01/13/2018  . Influenza-Unspecified 02/07/2015  . Pneumococcal Polysaccharide-23 04/29/2015  . Tdap 01/23/2015    Qualifies for Shingles Vaccine? Yes, discussed shingrix vaccine  TDAP: up to date Pneumococcal: up to date, prevnar due at age 38 Influenza: done today   Screening Tests Health Maintenance  Topic Date Due  . PNEUMOCOCCAL POLYSACCHARIDE VACCINE AGE 51-64 HIGH RISK  04/10/1974  . INFLUENZA VACCINE  11/25/2017  . FOOT EXAM  01/18/2018  . PAP SMEAR  01/24/2018  . HEMOGLOBIN A1C  04/23/2018  . OPHTHALMOLOGY EXAM  07/30/2018  . TETANUS/TDAP  01/25/2025  . HIV Screening  Discontinued    Cancer Screenings: Lung: Low Dose CT Chest recommended if Age 50-80 years, 30 pack-year currently smoking OR have quit w/in 15years. Patient does not qualify. Breast:  Up to date on Mammogram? Not indicated  Up to date of Bone Density/Dexa? Not indicated Colorectal: due at age 66  Additional Screenings:  Hepatitis C Screening: not indicated      Plan:    I have personally reviewed and addressed the Medicare Annual Wellness questionnaire and have noted the following in the patient's chart:  A. Medical and social history B. Use of alcohol, tobacco or illicit drugs  C. Current medications and supplements D.  Functional ability and status E.  Nutritional status F.  Physical activity G. Advance directives H. List of other physicians I.  Hospitalizations, surgeries, and ER visits in previous 12 months J.   Vitals K. Screenings such as hearing and vision if needed, cognitive and depression L. Referrals and appointments   In addition, I have reviewed and discussed with patient certain preventive protocols, quality metrics, and best practice recommendations. A written personalized care plan for preventive services as well as general preventive health recommendations were provided to patient.   Signed,  Marin Robertsiffany Cherril Hett, LPN Nurse Health Advisor   Nurse Notes: due for pap smear at the end of this month, she would like to have it done here in the office rather than going to a GYN. She has an appt in Dec 2019  Due for diabetic foot exam- cpe on 04/25/2018

## 2018-01-13 NOTE — Patient Instructions (Addendum)
Tabitha [name] , Thank you for taking time to come for yourMedicare Wellness Visit. I appreciate your ongoing commitment to your health goals. Please review the following plan we discussed and let me know if I can assist you in the future.   Screening recommendations/referrals: Colonoscopy: due at age 47 Mammogram: completed 07/26/2013, due at age 76 Bone Density: due at age 47 Recommended yearly ophthalmology/optometry visit for glaucoma screening and checkup Recommended yearly dental visit for hygiene and checkup  Vaccinations: Influenza vaccine: done today  Pneumococcal vaccine: up to date, prevnar 28 due at age 47 Tdap vaccine: up to date Shingles vaccine: eligible  at age 62  Advanced directives: Advance directive discussed with you today. I have provided a copy for you to complete at home and have notarized. Once this is complete please bring a copy in to our office so we can scan it into your chart.  Conditions/risks identified:  recommend drinking at least 6-8 glasses of water a day   Next appointment: Follow up on 03/29/2018 at 3pm with Apolonio Schneiders Lane,PA. Follow up in one year for your annual wellness exam.   Preventive Care 40-64 Years, Female Preventive care refers to lifestyle choices and visits with your health care provider that can promote health and wellness. What does preventive care include?  A yearly physical exam. This is also called an annual well check.  Dental exams once or twice a year.  Routine eye exams. Ask your health care provider how often you should have your eyes checked.  Personal lifestyle choices, including:  Daily care of your teeth and gums.  Regular physical activity.  Eating a healthy diet.  Avoiding tobacco and drug use.  Limiting alcohol use.  Practicing safe sex.  Taking low-dose aspirin daily starting at age 41.  Taking vitamin and mineral supplements as recommended by your health care provider. What happens during an annual well  check? The services and screenings done by your health care provider during your annual well check will depend on your age, overall health, lifestyle risk factors, and family history of disease. Counseling  Your health care provider may ask you questions about your:  Alcohol use.  Tobacco use.  Drug use.  Emotional well-being.  Home and relationship well-being.  Sexual activity.  Eating habits.  Work and work Statistician.  Method of birth control.  Menstrual cycle.  Pregnancy history. Screening  You may have the following tests or measurements:  Height, weight, and BMI.  Blood pressure.  Lipid and cholesterol levels. These may be checked every 5 years, or more frequently if you are over 3 years old.  Skin check.  Lung cancer screening. You may have this screening every year starting at age 40 if you have a 30-pack-year history of smoking and currently smoke or have quit within the past 15 years.  Fecal occult blood test (FOBT) of the stool. You may have this test every year starting at age 78.  Flexible sigmoidoscopy or colonoscopy. You may have a sigmoidoscopy every 5 years or a colonoscopy every 10 years starting at age 5.  Hepatitis C blood test.  Hepatitis B blood test.  Sexually transmitted disease (STD) testing.  Diabetes screening. This is done by checking your blood sugar (glucose) after you have not eaten for a while (fasting). You may have this done every 1-3 years.  Mammogram. This may be done every 1-2 years. Talk to your health care provider about when you should start having regular mammograms. This may depend on whether  you have a family history of breast cancer.  BRCA-related cancer screening. This may be done if you have a family history of breast, ovarian, tubal, or peritoneal cancers.  Pelvic exam and Pap test. This may be done every 3 years starting at age 27. Starting at age 25, this may be done every 5 years if you have a Pap test in  combination with an HPV test.  Bone density scan. This is done to screen for osteoporosis. You may have this scan if you are at high risk for osteoporosis. Discuss your test results, treatment options, and if necessary, the need for more tests with your health care provider. Vaccines  Your health care provider may recommend certain vaccines, such as:  Influenza vaccine. This is recommended every year.  Tetanus, diphtheria, and acellular pertussis (Tdap, Td) vaccine. You may need a Td booster every 10 years.  Zoster vaccine. You may need this after age 77.  Pneumococcal 13-valent conjugate (PCV13) vaccine. You may need this if you have certain conditions and were not previously vaccinated.  Pneumococcal polysaccharide (PPSV23) vaccine. You may need one or two doses if you smoke cigarettes or if you have certain conditions. Talk to your health care provider about which screenings and vaccines you need and how often you need them. This information is not intended to replace advice given to you by your health care provider. Make sure you discuss any questions you have with your health care provider. Document Released: 05/10/2015 Document Revised: 01/01/2016 Document Reviewed: 02/12/2015 Elsevier Interactive Patient Education  2017 [place] Prevention in the Home Falls can cause injuries. They can happen to people of all ages. There are many things you can do to make your home safe and to help prevent falls. What can I do on the outside of my home?  Regularly fix the edges of walkways and driveways and fix any cracks.  Remove anything that might make you trip as you walk through a door, such as a raised step or threshold.  Trim any bushes or trees on the path to your home.  Use bright outdoor lighting.  Clear any walking paths of anything that might make someone trip, such as rocks or tools.  Regularly check to see if handrails are loose or broken. Make sure that both  sides of any steps have handrails.  Any raised decks and porches should have guardrails on the edges.  Have any leaves, snow, or ice cleared regularly.  Use sand or salt on walking paths during winter.  Clean up any spills in your garage right away. This includes oil or grease spills. What can I do in the bathroom?  Use night lights.  Install grab bars by the toilet and in the tub and shower. Do not use towel bars as grab bars.  Use non-skid mats or decals in the tub or shower.  If you need to sit down in the shower, use a plastic, non-slip stool.  Keep the floor dry. Clean up any water that spills on the floor as soon as it happens.  Remove soap buildup in the tub or shower regularly.  Attach bath mats securely with double-sided non-slip rug tape.  Do not have throw rugs and other things on the floor that can make you trip. What can I do in the bedroom?  Use night lights.  Make sure that you have a light by your bed that is easy to reach.  Do not use any sheets or  blankets that are too big for your bed. They should not hang down onto the floor.  Have a firm chair that has side arms. You can use this for support while you get dressed.  Do not have throw rugs and other things on the floor that can make you trip. What can I do in the kitchen?  Clean up any spills right away.  Avoid walking on wet floors.  Keep items that you use a lot in easy-to-reach places.  If you need to reach something above you, use a strong step stool that has a grab bar.  Keep electrical cords out of the way.  Do not use floor polish or wax that makes floors slippery. If you must use wax, use non-skid floor wax.  Do not have throw rugs and other things on the floor that can make you trip. What can I do with my stairs?  Do not leave any items on the stairs.  Make sure that there are handrails on both sides of the stairs and use them. Fix handrails that are broken or loose. Make sure that  handrails are as long as the stairways.  Check any carpeting to make sure that it is firmly attached to the stairs. Fix any carpet that is loose or worn.  Avoid having throw rugs at the top or bottom of the stairs. If you do have throw rugs, attach them to the floor with carpet tape.  Make sure that you have a light switch at the top of the stairs and the bottom of the stairs. If you do not have them, ask someone to add them for you. What else can I do to help prevent falls?  Wear shoes that:  Do not have high heels.  Have rubber bottoms.  Are comfortable and fit you well.  Are closed at the toe. Do not wear sandals.  If you use a stepladder:  Make sure that it is fully opened. Do not climb a closed stepladder.  Make sure that both sides of the stepladder are locked into place.  Ask someone to hold it for you, if possible.  Clearly mark and make sure that you can see:  Any grab bars or handrails.  First and last steps.  Where the edge of each step is.  Use tools that help you move around (mobility aids) if they are needed. These include:  Canes.  Walkers.  Scooters.  Crutches.  Turn on the lights when you go into a dark area. Replace any light bulbs as soon as they burn out.  Set up your furniture so you have a clear path. Avoid moving your furniture around.  If any of your floors are uneven, fix them.  If there are any pets around you, be aware of where they are.  Review your medicines with your doctor. Some medicines can make you feel dizzy. This can increase your chance of falling. Ask your doctor what other things that you can do to help prevent falls. This information is not intended to replace advice given to you by your health care provider. Make sure you discuss any questions you have with your health care provider. Document Released: 02/07/2009 Document Revised: 09/19/2015 Document Reviewed: 05/18/2014 Elsevier Interactive Patient Education  2017  [place]. Influenza (Flu) Vaccine (Inactivated or Recombinant): What You Need to Know 1. Why get vaccinated? Influenza ("flu") is a contagious disease that spreads around the [place] every year, usually between October and May. Flu is caused by  influenza viruses, and is spread mainly by coughing, sneezing, and close contact. Anyone can get flu. Flu strikes suddenly and can last several days. Symptoms vary by age, but can include:  fever/chills  sore throat  muscle aches  fatigue  cough  headache  runny or stuffy nose  Flu can also lead to pneumonia and blood infections, and cause diarrhea and seizures in children. If you have a medical condition, such as heart or lung disease, flu can make it worse. Flu is more dangerous for some people. Infants and young children, people 38 years of age and older, pregnant women, and people with certain health conditions or a weakened immune system are at greatest risk. Each year thousands of people in the [place] die from flu, and many more are hospitalized. Flu vaccine can:  keep you from getting flu,  make flu less severe if you do get it, and  keep you from spreading flu to your family and other people. 2. Inactivated and recombinant flu vaccines A dose of flu vaccine is recommended every flu season. Children 6 months through 35 years of age may need two doses during the same flu season. Everyone else needs only one dose each flu season. Some inactivated flu vaccines contain a very small amount of a mercury-based preservative called thimerosal. Studies have not shown thimerosal in vaccines to be harmful, but flu vaccines that do not contain thimerosal are available. There is no live flu virus in flu shots. They cannot cause the flu. There are many flu viruses, and they are always changing. Each year a new flu vaccine is made to protect against three or four viruses that are likely to cause disease in the upcoming flu season. But  even when the vaccine doesn't exactly match these viruses, it may still provide some protection. Flu vaccine cannot prevent:  flu that is caused by a virus not covered by the vaccine, or  illnesses that look like flu but are not.  It takes about 2 weeks for protection to develop after vaccination, and protection lasts through the flu season. 3. Some people should not get this vaccine Tell the person who is giving you the vaccine:  If you have any severe, life-threatening allergies. If you ever had a life-threatening allergic reaction after a dose of flu vaccine, or have a severe allergy to any part of this vaccine, you may be advised not to get vaccinated. Most, but not all, types of flu vaccine contain a small amount of egg protein.  If you ever had Guillain-Barr Syndrome (also called GBS). Some people with a history of GBS should not get this vaccine. This should be discussed with your doctor.  If you are not feeling well. It is usually okay to get flu vaccine when you have a mild illness, but you might be asked to come back when you feel better.  4. Risks of a vaccine reaction With any medicine, including vaccines, there is a chance of reactions. These are usually mild and go away on their own, but serious reactions are also possible. Most people who get a flu shot do not have any problems with it. Minor problems following a flu shot include:  soreness, redness, or swelling where the shot was given  hoarseness  sore, red or itchy eyes  cough  fever  aches  headache  itching  fatigue  If these problems occur, they usually begin soon after the shot and last 1 or 2 days. More serious problems following  a flu shot can include the following:  There may be a small increased risk of Guillain-Barre Syndrome (GBS) after inactivated flu vaccine. This risk has been estimated at 1 or 2 additional cases per million people vaccinated. This is much lower than the risk of severe  complications from flu, which can be prevented by flu vaccine.  Young children who get the flu shot along with pneumococcal vaccine (PCV13) and/or DTaP vaccine at the same time might be slightly more likely to have a seizure caused by fever. Ask your doctor for more information. Tell your doctor if a child who is getting flu vaccine has ever had a seizure.  Problems that could happen after any injected vaccine:  People sometimes faint after a medical procedure, including vaccination. Sitting or lying down for about 15 minutes can help prevent fainting, and injuries caused by a fall. Tell your doctor if you feel dizzy, or have vision changes or ringing in the ears.  Some people get severe pain in the shoulder and have difficulty moving the arm where a shot was given. This happens very rarely.  Any medication can cause a severe allergic reaction. Such reactions from a vaccine are very rare, estimated at about 1 in a million doses, and would happen within a few minutes to a few hours after the vaccination. As with any medicine, there is a very remote chance of a vaccine causing a serious injury or death. The safety of vaccines is always being monitored. For more information, visit: http://www.aguilar.org/ 5. What if there is a serious reaction? What should I look for? Look for anything that concerns you, such as signs of a severe allergic reaction, very high fever, or unusual behavior. Signs of a severe allergic reaction can include hives, swelling of the face and throat, difficulty breathing, a fast heartbeat, dizziness, and weakness. These would start a few minutes to a few hours after the vaccination. What should I do?  If you think it is a severe allergic reaction or other emergency that can't wait, call 9-1-1 and get the person to the nearest hospital. Otherwise, call your doctor.  Reactions should be reported to the Vaccine Adverse Event Reporting System (VAERS). Your doctor should file  this report, or you can do it yourself through the VAERS web site at www.vaers.SamedayNews.es, or by calling 765-711-5199. ? VAERS does not give medical advice. 6. The National Vaccine Injury Compensation Program The [employer] Vaccine Injury Compensation Program (VICP) is a federal program that was created to compensate people who may have been injured by certain vaccines. Persons who believe they may have been injured by a vaccine can learn about the program and about filing a claim by calling 515-045-7541 or visiting the [place] website at GoldCloset.com.ee. There is a time limit to file a claim for compensation. 7. How can I learn more?  Ask your healthcare provider. He or she can give you the vaccine package insert or suggest other sources of information.  Call your local or state health department.  Contact the Centers for Disease Control and Prevention (CDC): ? Call 985 068 3197 (1-800-CDC-INFO) or ? Visit CDC's website at https://gibson.com/ Vaccine Information Statement, Inactivated Influenza Vaccine (12/01/2013) This information is not intended to replace advice given to you by your health care provider. Make sure you discuss any questions you have with your health care provider. Document Released: 02/05/2006 Document Revised: 01/02/2016 Document Reviewed: 01/02/2016 Elsevier Interactive Patient Education  2017 [employer].

## 2018-01-16 ENCOUNTER — Other Ambulatory Visit: Payer: Self-pay | Admitting: Family Medicine

## 2018-01-16 DIAGNOSIS — E119 Type 2 diabetes mellitus without complications: Secondary | ICD-10-CM

## 2018-01-17 ENCOUNTER — Other Ambulatory Visit: Payer: Self-pay | Admitting: Family Medicine

## 2018-01-17 DIAGNOSIS — E119 Type 2 diabetes mellitus without complications: Secondary | ICD-10-CM

## 2018-01-17 NOTE — Telephone Encounter (Signed)
Your patient 

## 2018-01-31 ENCOUNTER — Ambulatory Visit (INDEPENDENT_AMBULATORY_CARE_PROVIDER_SITE_OTHER): Payer: Medicare Other | Admitting: Psychiatry

## 2018-01-31 ENCOUNTER — Ambulatory Visit (INDEPENDENT_AMBULATORY_CARE_PROVIDER_SITE_OTHER): Payer: Medicare Other

## 2018-01-31 ENCOUNTER — Encounter: Payer: Self-pay | Admitting: Psychiatry

## 2018-01-31 ENCOUNTER — Other Ambulatory Visit: Payer: Self-pay

## 2018-01-31 VITALS — BP 115/82 | HR 85 | Temp 97.5°F | Wt 225.2 lb

## 2018-01-31 VITALS — BP 115/82 | HR 85 | Temp 97.5°F | Ht 62.5 in | Wt 225.2 lb

## 2018-01-31 DIAGNOSIS — F251 Schizoaffective disorder, depressive type: Secondary | ICD-10-CM

## 2018-01-31 DIAGNOSIS — F429 Obsessive-compulsive disorder, unspecified: Secondary | ICD-10-CM | POA: Diagnosis not present

## 2018-01-31 DIAGNOSIS — F250 Schizoaffective disorder, bipolar type: Secondary | ICD-10-CM

## 2018-01-31 DIAGNOSIS — F422 Mixed obsessional thoughts and acts: Secondary | ICD-10-CM

## 2018-01-31 DIAGNOSIS — F3170 Bipolar disorder, currently in remission, most recent episode unspecified: Secondary | ICD-10-CM | POA: Diagnosis not present

## 2018-01-31 DIAGNOSIS — F259 Schizoaffective disorder, unspecified: Secondary | ICD-10-CM

## 2018-01-31 DIAGNOSIS — F428 Other obsessive-compulsive disorder: Secondary | ICD-10-CM

## 2018-01-31 DIAGNOSIS — F319 Bipolar disorder, unspecified: Secondary | ICD-10-CM

## 2018-01-31 DIAGNOSIS — F3130 Bipolar disorder, current episode depressed, mild or moderate severity, unspecified: Secondary | ICD-10-CM | POA: Diagnosis not present

## 2018-01-31 MED ORDER — PALIPERIDONE PALMITATE ER 156 MG/ML IM SUSY
156.0000 mg | PREFILLED_SYRINGE | Freq: Once | INTRAMUSCULAR | Status: AC
  Administered 2018-01-31: 156 mg via INTRAMUSCULAR

## 2018-01-31 MED ORDER — DIVALPROEX SODIUM 500 MG PO DR TAB: 500.0000 mg | DELAYED_RELEASE_TABLET | Freq: Every evening | ORAL | 0 refills | Status: DC

## 2018-01-31 MED ORDER — PALIPERIDONE PALMITATE ER 234 MG/1.5ML IM SUSY: 234.0000 mg | PREFILLED_SYRINGE | Freq: Once | INTRAMUSCULAR | 0 refills | Status: DC

## 2018-01-31 MED ORDER — PALIPERIDONE ER 6 MG PO TB24: 6.0000 mg | ORAL_TABLET | Freq: Two times a day (BID) | ORAL | 2 refills | Status: DC

## 2018-01-31 NOTE — Progress Notes (Signed)
Left Upper Outer Quadrant invega 156mg  injection  lot#[id number] s/n# [id number]  exp 05-2019 NDC# 59563-875-64.  Pt states she feels she needs more. Per dr. Garnetta Buddy will increase next time.

## 2018-01-31 NOTE — Progress Notes (Signed)
BH MD OP Progress Note  01/31/2018 2:08 PM Tabitha [name]  MRN:  [id number]  Chief Complaint: Follow-up  Chief Complaint    Follow-up; Medication Refill     HPI: Tabitha [name] is a 47 year old married, Caucasian female, who is on SSD, lives in [place], presented to the clinic today for a follow-up visit.  Patient has a history of schizoaffective disorder as well as unspecified OCD symptoms.  Pt reported that she noticed worsening of paranoia in the past few weeks.  She reported that her symptoms got worse since her last injection.  She feels that the cameras are looking at her and people from the church are following her.  She stated that she has not noticed improvement in her symptoms.  She did not call the office or did not come for an early appointment.  She continues to live with her family.  She reported that she has been sleeping well at night.  Patient appeared apprehensive during the appointment.  We discussed about her medications.  She reported that she wants to go higher on the dose of the injection at this time.  She denied having any suicidal homicidal ideations or plans.  She reported that she does not have any auditory hallucinations at this time.    Patient is able to contract for safety.  She is agreeable to medication adjustment.      She appears alert and oriented during the interview.     Visit Diagnosis:    ICD-10-CM   1. Schizoaffective disorder, unspecified type (HCC) F25.9   2. Obsessive-compulsive disorder, unspecified type F42.9     Past Psychiatric History: History of schizoaffective disorder, OCD.  She used to follow up with [facility] in [place] in the past and then with Dr. Garnetta Buddy.  She denies any suicide attempts.  Denies any inpatient mental health admissions.  Past Medical History:  Past Medical History:  Diagnosis Date  . Allergic rhinitis   . Anxiety   . Asthma   . Bipolar disorder (HCC)   . Depression   . Diabetes mellitus without  complication (HCC)   . GERD (gastroesophageal reflux disease)   . Hyperlipidemia   . Mild sleep apnea   . OCD (obsessive compulsive disorder)   . Palpitations   . Paranoia (HCC)   . Schizoaffective disorder (HCC)   . Sleep apnea     Past Surgical History:  Procedure Laterality Date  . CESAREAN SECTION  2006/2008  . CHOLECYSTECTOMY  2006    Family Psychiatric History: Denies.  Family History:  Family History  Problem Relation Age of Onset  . Diabetes Father   . Hyperlipidemia Father   . Diabetes Brother   . Lupus Mother   . Diabetes Mother        pre diabetes   Substance abuse history: Denies   Social History: Married, lives with her husband.  She is on SSD.  Her husband is also disabled.  He is a double amputee, from a past history of abdominal gangrene which spread.  She has 2 children aged 64 and 1 years old.  They are home schooled at this time. Social History   Socioeconomic History  . Marital status: Married    Spouse name: kevin  . Number of children: 2  . Years of education: Not on file  . Highest education level: Bachelor's degree (e.g., BA, AB, BS)  Occupational History  . Not on file  Social Needs  . Financial resource strain: Not hard at all  .  Food insecurity:    Worry: Sometimes true    Inability: Sometimes true  . Transportation needs:    Medical: No    Non-medical: No  Tobacco Use  . Smoking status: Never Smoker  . Smokeless tobacco: Never Used  Substance and Sexual Activity  . Alcohol use: No    Alcohol/week: 0.0 standard drinks  . Drug use: No  . Sexual activity: Yes    Partners: Male    Birth control/protection: None, Surgical  Lifestyle  . Physical activity:    Days per week: 0 days    Minutes per session: 0 min  . Stress: To some extent  Relationships  . Social connections:    Talks on phone: More than three times a week    Gets together: More than three times a week    Attends religious service: More than 4 times per year     Active member of club or organization: Yes    Attends meetings of clubs or organizations: More than 4 times per year    Relationship status: Married  Other Topics Concern  . Not on file  Social History Narrative  . Not on file    Allergies:  Allergies  Allergen Reactions  . Abilify [Aripiprazole] Hives    Metabolic Disorder Labs: Lab Results  Component Value Date   HGBA1C 7.0 (H) 07/22/2017   Lab Results  Component Value Date   PROLACTIN 84.8 (H) 06/01/2017   PROLACTIN 36.2 (H) 03/26/2016   Lab Results  Component Value Date   CHOL 142 10/22/2017   TRIG 212 (H) 10/22/2017   HDL 49 10/22/2017   CHOLHDL 2.9 09/26/2015   VLDL 51 (H) 04/23/2017   LDLCALC 51 10/22/2017   LDLCALC 63 03/26/2016   Lab Results  Component Value Date   TSH 2.570 06/01/2017   TSH 2.500 03/26/2016    Therapeutic Level Labs: No results found for: LITHIUM Lab Results  Component Value Date   VALPROATE 52 10/22/2017   VALPROATE 43 (L) 06/01/2017   No components found for:  CBMZ  Current Medications: Current Outpatient Medications  Medication Sig Dispense Refill  . albuterol (PROAIR HFA) 108 (90 Base) MCG/ACT inhaler Inhale 2 puffs into the lungs every 6 (six) hours as needed for wheezing or shortness of breath. 8.5 g 6  . amantadine (SYMMETREL) 100 MG capsule Take 1 capsule (100 mg total) by mouth 2 (two) times daily. 60 capsule 6  . atorvastatin (LIPITOR) 20 MG tablet Take 1 tablet (20 mg total) by mouth at bedtime. 90 tablet 1  . benzonatate (TESSALON) 200 MG capsule TAKE 1 CAPSULE(200 MG) BY MOUTH TWICE DAILY AS NEEDED FOR COUGH 30 capsule 0  . divalproex (DEPAKOTE) 250 MG DR tablet Take 1 tablet (250 mg total) by mouth every morning. 90 tablet 0  . divalproex (DEPAKOTE) 500 MG DR tablet Take 1 tablet (500 mg total) by mouth every evening. 90 tablet 0  . doxycycline (VIBRAMYCIN) 50 MG capsule Take 1 capsule (50 mg total) by mouth every morning. 90 capsule 1  . empagliflozin (JARDIANCE) 25  MG TABS tablet Take 25 mg by mouth daily. 90 tablet 1  . fluticasone (FLOVENT HFA) 110 MCG/ACT inhaler Inhale 1 puff into the lungs 2 (two) times daily. 1 Inhaler 11  . gabapentin (NEURONTIN) 400 MG capsule Take 1 capsule (400 mg total) by mouth 3 (three) times daily. 270 capsule 3  . glucose blood (ACCU-CHEK AVIVA PLUS) test strip 1 each by Other route as needed for other. Use  as instructed 100 each 12  . hydrochlorothiazide (HYDRODIURIL) 12.5 MG tablet TAKE 1 TABLET(12.5 MG) BY MOUTH DAILY 90 tablet 1  . Insulin Detemir (LEVEMIR) 100 UNIT/ML Pen Inject 40 Units into the skin 2 (two) times daily. 5 pen 11  . Insulin Pen Needle (BD PEN NEEDLE NANO U/F) 32G X 4 MM MISC 1 each by Does not apply route daily. 100 each 12  . metFORMIN (GLUCOPHAGE) 500 MG tablet TAKE 2 TABLETS BY MOUTH TWICE DAILY BEFORE A MEAL 360 tablet 0  . NOVOTWIST 32G X 5 MM MISC USE WITH LEVEMIR PEN BID  5  . omeprazole (PRILOSEC) 10 MG capsule Take 1 capsule (10 mg total) by mouth daily. 90 capsule 1  . paliperidone (INVEGA) 6 MG 24 hr tablet Take 1 tablet (6 mg total) by mouth daily. 30 tablet 6  . pioglitazone (ACTOS) 45 MG tablet Take 1 tablet (45 mg total) by mouth daily. 90 tablet 1  . pioglitazone (ACTOS) 45 MG tablet TAKE 1 TABLET BY MOUTH DAILY 90 tablet 0  . Vilazodone HCl (VIIBRYD) 40 MG TABS Take 1 tablet (40 mg total) by mouth every morning. 90 tablet 0  . paliperidone (INVEGA SUSTENNA) 156 MG/ML SUSY injection Inject 1 mL (156 mg total) into the muscle once for 1 dose. 1 mL 6   Current Facility-Administered Medications  Medication Dose Route Frequency Provider Last Rate Last Dose  . paliperidone (INVEGA SUSTENNA) injection 156 mg  156 mg Intramuscular Once Brandy Hale, MD         Musculoskeletal: Strength & Muscle Tone: within normal limits Gait & Station: normal Patient leans: N/A  Psychiatric Specialty Exam: Review of Systems  Psychiatric/Behavioral: The patient is nervous/anxious.   All other systems  reviewed and are negative.   Blood pressure 115/82, pulse 85, temperature (!) 97.5 F (36.4 C), temperature source Oral, weight 225 lb 3.2 oz (102.2 kg).Body mass index is 40.53 kg/m.  General Appearance: Casual  Eye Contact:  Fair  Speech:  Normal Rate  Volume:  Normal  Mood:  Anxious  Affect:  Congruent  Thought Process:  Goal Directed and Descriptions of Associations: Intact  Orientation:  Full (Time, Place, and Person)  Thought Content: Delusions and Paranoid Ideation , chronic , on and off, able to cope  Suicidal Thoughts:  No  Homicidal Thoughts:  No  Memory:  Immediate;   Fair Recent;   Fair Remote;   Fair  Judgement:  Fair  Insight:  Fair  Psychomotor Activity:  Normal  Concentration:  Concentration: Fair and Attention Span: Fair  Recall:  [employer] of Knowledge: Fair  Language: Fair  Akathisia:  No  Handed:  Right  AIMS (if indicated): 0  Assets:  Communication Skills Desire for Improvement Housing Intimacy Social Support Talents/Skills  ADL's:  Intact  Cognition: WNL  Sleep:  Fair   Screenings: AIMS     Office Visit from 05/12/2017 in [facility] Office Visit from 07/29/2016 in [facility] Office Visit from 05/06/2016 in [facility] Office Visit from 04/01/2016 in [facility] Office Visit from 03/28/2015 in [facility]  AIMS Total Score  0  0  0  0  0    PHQ2-9     Clinical Support from 01/13/2018 in [facility] Clinical Support from 01/13/2017 in [facility] Office Visit from 09/24/2015 in [facility] Office Visit from 06/20/2015 in [facility] Office Visit from 03/15/2015  in [facility]  PHQ-2 Total Score  2  2  3   0  0  PHQ-9 Total Score  8  12  8   -  -       Assessment and Plan: Tabitha [name] is a 47 year old Caucasian female who has a history of  schizoaffective disorder, anxiety disorder, insomnia, OSA on CPAP who presented to the clinic today for a follow-up visit.     Plan For schizoaffective disorder Invega sustenna 156 mg IM q. 28 days.  She will receive injection today.    For anxiety symptoms Continue Viibryd 40 mg p.o. daily Continue gabapentin 400 mg p.o. 3 times daily.  She also takes it for neuropathy.   I will increase the dose of Invega 6 mg p.o. twice daily in the interim and will start her on Invega Sustenna 234 mg IM in 3 weeks.  Continue Depakote as prescribed  Medications  refilled  Follow up in clinic in 3 weeks or sooner if needed.  More than 50 % of the time was spent for psychoeducation and supportive psychotherapy and care coordination.  This note was generated in part or whole with voice recognition software. Voice recognition is usually quite accurate but there are transcription errors that can and very often do occur. I apologize for any typographical errors that were not detected and corrected.        Brandy Hale, MD 01/31/2018, 2:08 PM

## 2018-02-16 ENCOUNTER — Other Ambulatory Visit: Payer: Self-pay | Admitting: Psychiatry

## 2018-02-21 ENCOUNTER — Ambulatory Visit: Payer: Medicare Other

## 2018-02-21 ENCOUNTER — Ambulatory Visit: Payer: Medicare Other | Admitting: Psychiatry

## 2018-02-28 ENCOUNTER — Encounter: Payer: Self-pay | Admitting: Psychiatry

## 2018-02-28 ENCOUNTER — Ambulatory Visit (INDEPENDENT_AMBULATORY_CARE_PROVIDER_SITE_OTHER): Payer: Medicare Other

## 2018-02-28 ENCOUNTER — Ambulatory Visit: Payer: Medicare Other | Admitting: Psychiatry

## 2018-02-28 ENCOUNTER — Other Ambulatory Visit: Payer: Self-pay

## 2018-02-28 VITALS — BP 134/84 | HR 84 | Temp 97.4°F | Ht 62.5 in | Wt 228.2 lb

## 2018-02-28 DIAGNOSIS — Z9989 Dependence on other enabling machines and devices: Secondary | ICD-10-CM

## 2018-02-28 DIAGNOSIS — F3170 Bipolar disorder, currently in remission, most recent episode unspecified: Secondary | ICD-10-CM

## 2018-02-28 DIAGNOSIS — F429 Obsessive-compulsive disorder, unspecified: Secondary | ICD-10-CM

## 2018-02-28 DIAGNOSIS — F422 Mixed obsessional thoughts and acts: Secondary | ICD-10-CM

## 2018-02-28 DIAGNOSIS — F428 Other obsessive-compulsive disorder: Secondary | ICD-10-CM

## 2018-02-28 DIAGNOSIS — F259 Schizoaffective disorder, unspecified: Secondary | ICD-10-CM

## 2018-02-28 DIAGNOSIS — F319 Bipolar disorder, unspecified: Secondary | ICD-10-CM | POA: Diagnosis not present

## 2018-02-28 DIAGNOSIS — F251 Schizoaffective disorder, depressive type: Secondary | ICD-10-CM

## 2018-02-28 DIAGNOSIS — G4733 Obstructive sleep apnea (adult) (pediatric): Secondary | ICD-10-CM

## 2018-02-28 DIAGNOSIS — F3130 Bipolar disorder, current episode depressed, mild or moderate severity, unspecified: Secondary | ICD-10-CM | POA: Diagnosis not present

## 2018-02-28 DIAGNOSIS — F250 Schizoaffective disorder, bipolar type: Secondary | ICD-10-CM

## 2018-02-28 MED ORDER — PALIPERIDONE PALMITATE ER 234 MG/1.5ML IM SUSY: PREFILLED_SYRINGE | INTRAMUSCULAR | 2 refills | Status: DC

## 2018-02-28 MED ORDER — PALIPERIDONE PALMITATE ER 156 MG/ML IM SUSY
234.0000 mg | PREFILLED_SYRINGE | Freq: Once | INTRAMUSCULAR | Status: AC
  Administered 2018-02-28: 234 mg via INTRAMUSCULAR

## 2018-02-28 NOTE — Progress Notes (Signed)
Right Upper Outer Quadrant invega 234mg  injection  lot#[id number] s/n# [id number]  exp 08-2019 NDC# 81191-478-29 this is a increase in dosage from last visit.

## 2018-02-28 NOTE — Progress Notes (Signed)
BH MD OP Progress Note  02/28/2018 1:41 PM Tabitha [name]  MRN:  [id number]  Chief Complaint: Follow-up  Chief Complaint    Follow-up; Medication Refill     HPI: Tabitha [name] is a 47 year old married, Caucasian female, who is on SSD, lives in [place], presented to the clinic today for a follow-up visit.  Patient has a history of schizoaffective disorder as well as unspecified OCD symptoms.  Pt reported that she has noticed some improvement in her symptoms.  She continues to have camera and they are watching her but she does not feel that the people are following her.  She reported that she has been compliant with her medications.  She has gained some weight as she started eating candies with her children.  She reported that she stopped her diet.  We will start working on her diet again.  Patient reported that she has brought the [place] 234 mg injection for this visit.  She does not have any side effects from the injections she has been receiving on a monthly injection.  We discussed about the injections in the he is agreeable to titrate the dose of the injection at this time.  Patient reported that she is living with her family including his children schooling them and it is going well..      Patient currently denied having any suicidal homicidal ideations or plans.  Her husband remains supportive.  Patient is able to contract for safety.  She is agreeable to medication adjustment.      She appears alert and oriented during the interview.     Visit Diagnosis:    ICD-10-CM   1. Schizoaffective disorder, unspecified type (HCC) F25.9   2. Obsessive-compulsive disorder, unspecified type F42.9     Past Psychiatric History: History of schizoaffective disorder, OCD.  She used to follow up with [facility] in [place] in the past and then with Dr. Garnetta Buddy.  She denies any suicide attempts.  Denies any inpatient mental health admissions.  Past Medical History:  Past Medical History:   Diagnosis Date  . Allergic rhinitis   . Anxiety   . Asthma   . Bipolar disorder (HCC)   . Depression   . Diabetes mellitus without complication (HCC)   . GERD (gastroesophageal reflux disease)   . Hyperlipidemia   . Mild sleep apnea   . OCD (obsessive compulsive disorder)   . Palpitations   . Paranoia (HCC)   . Schizoaffective disorder (HCC)   . Sleep apnea     Past Surgical History:  Procedure Laterality Date  . CESAREAN SECTION  2006/2008  . CHOLECYSTECTOMY  2006    Family Psychiatric History: Denies.  Family History:  Family History  Problem Relation Age of Onset  . Diabetes Father   . Hyperlipidemia Father   . Diabetes Brother   . Lupus Mother   . Diabetes Mother        pre diabetes   Substance abuse history: Denies   Social History: Married, lives with her husband.  She is on SSD.  Her husband is also disabled.  He is a double amputee, from a past history of abdominal gangrene which spread.  She has 2 children aged 65 and 51 years old.  They are home schooled at this time. Social History   Socioeconomic History  . Marital status: Married    Spouse name: kevin  . Number of children: 2  . Years of education: Not on file  . Highest education level: Bachelor's degree (  e.g., BA, AB, BS)  Occupational History  . Not on file  Social Needs  . Financial resource strain: Not hard at all  . Food insecurity:    Worry: Sometimes true    Inability: Sometimes true  . Transportation needs:    Medical: No    Non-medical: No  Tobacco Use  . Smoking status: Never Smoker  . Smokeless tobacco: Never Used  Substance and Sexual Activity  . Alcohol use: No    Alcohol/week: 0.0 standard drinks  . Drug use: No  . Sexual activity: Yes    Partners: Male    Birth control/protection: None, Surgical  Lifestyle  . Physical activity:    Days per week: 0 days    Minutes per session: 0 min  . Stress: To some extent  Relationships  . Social connections:    Talks on phone:  More than three times a week    Gets together: More than three times a week    Attends religious service: More than 4 times per year    Active member of club or organization: Yes    Attends meetings of clubs or organizations: More than 4 times per year    Relationship status: Married  Other Topics Concern  . Not on file  Social History Narrative  . Not on file    Allergies:  Allergies  Allergen Reactions  . Abilify [Aripiprazole] Hives    Metabolic Disorder Labs: Lab Results  Component Value Date   HGBA1C 7.0 (H) 07/22/2017   Lab Results  Component Value Date   PROLACTIN 84.8 (H) 06/01/2017   PROLACTIN 36.2 (H) 03/26/2016   Lab Results  Component Value Date   CHOL 142 10/22/2017   TRIG 212 (H) 10/22/2017   HDL 49 10/22/2017   CHOLHDL 2.9 09/26/2015   VLDL 51 (H) 04/23/2017   LDLCALC 51 10/22/2017   LDLCALC 63 03/26/2016   Lab Results  Component Value Date   TSH 2.570 06/01/2017   TSH 2.500 03/26/2016    Therapeutic Level Labs: No results found for: LITHIUM Lab Results  Component Value Date   VALPROATE 52 10/22/2017   VALPROATE 43 (L) 06/01/2017   No components found for:  CBMZ  Current Medications: Current Outpatient Medications  Medication Sig Dispense Refill  . albuterol (PROAIR HFA) 108 (90 Base) MCG/ACT inhaler Inhale 2 puffs into the lungs every 6 (six) hours as needed for wheezing or shortness of breath. 8.5 g 6  . amantadine (SYMMETREL) 100 MG capsule Take 1 capsule (100 mg total) by mouth 2 (two) times daily. 60 capsule 6  . atorvastatin (LIPITOR) 20 MG tablet Take 1 tablet (20 mg total) by mouth at bedtime. 90 tablet 1  . benzonatate (TESSALON) 200 MG capsule TAKE 1 CAPSULE(200 MG) BY MOUTH TWICE DAILY AS NEEDED FOR COUGH 30 capsule 0  . divalproex (DEPAKOTE) 250 MG DR tablet Take 1 tablet (250 mg total) by mouth every morning. 90 tablet 0  . divalproex (DEPAKOTE) 500 MG DR tablet TAKE 1 TABLET BY MOUTH EVERY EVENING 90 tablet 0  . divalproex  (DEPAKOTE) 500 MG DR tablet Take 1 tablet (500 mg total) by mouth every evening. 90 tablet 0  . doxycycline (VIBRAMYCIN) 50 MG capsule Take 1 capsule (50 mg total) by mouth every morning. 90 capsule 1  . empagliflozin (JARDIANCE) 25 MG TABS tablet Take 25 mg by mouth daily. 90 tablet 1  . fluticasone (FLOVENT HFA) 110 MCG/ACT inhaler Inhale 1 puff into the lungs 2 (two) times daily.  1 Inhaler 11  . gabapentin (NEURONTIN) 400 MG capsule Take 1 capsule (400 mg total) by mouth 3 (three) times daily. 270 capsule 3  . glucose blood (ACCU-CHEK AVIVA PLUS) test strip 1 each by Other route as needed for other. Use as instructed 100 each 12  . hydrochlorothiazide (HYDRODIURIL) 12.5 MG tablet TAKE 1 TABLET(12.5 MG) BY MOUTH DAILY 90 tablet 1  . Insulin Detemir (LEVEMIR) 100 UNIT/ML Pen Inject 40 Units into the skin 2 (two) times daily. 5 pen 11  . Insulin Pen Needle (BD PEN NEEDLE NANO U/F) 32G X 4 MM MISC 1 each by Does not apply route daily. 100 each 12  . INVEGA SUSTENNA 234 MG/1.5ML SUSY injection INJECT 234 MG( 1.5 ML) IN THE MUSCLE ONCE FOR 1 DOSE 1.5 mL 0  . metFORMIN (GLUCOPHAGE) 500 MG tablet TAKE 2 TABLETS BY MOUTH TWICE DAILY BEFORE A MEAL 360 tablet 0  . NOVOTWIST 32G X 5 MM MISC USE WITH LEVEMIR PEN BID  5  . omeprazole (PRILOSEC) 10 MG capsule Take 1 capsule (10 mg total) by mouth daily. 90 capsule 1  . paliperidone (INVEGA) 6 MG 24 hr tablet Take 1 tablet (6 mg total) by mouth 2 (two) times daily. 60 tablet 2  . pioglitazone (ACTOS) 45 MG tablet Take 1 tablet (45 mg total) by mouth daily. 90 tablet 1  . pioglitazone (ACTOS) 45 MG tablet TAKE 1 TABLET BY MOUTH DAILY 90 tablet 0  . VIIBRYD 40 MG TABS TAKE 1 TABLET BY MOUTH EVERY MORNING 90 tablet 0  . Vilazodone HCl (VIIBRYD) 40 MG TABS Take 1 tablet (40 mg total) by mouth every morning. 90 tablet 0   Current Facility-Administered Medications  Medication Dose Route Frequency Provider Last Rate Last Dose  . paliperidone (INVEGA SUSTENNA)  injection 156 mg  156 mg Intramuscular Once Brandy Hale, MD         Musculoskeletal: Strength & Muscle Tone: within normal limits Gait & Station: normal Patient leans: N/A  Psychiatric Specialty Exam: Review of Systems  Psychiatric/Behavioral: Positive for depression and hallucinations. The patient is nervous/anxious.   All other systems reviewed and are negative.   Blood pressure 134/84, pulse 84, temperature (!) 97.4 F (36.3 C), temperature source Oral, height 5' 2.5" (1.588 m), weight 228 lb 3.2 oz (103.5 kg).Body mass index is 41.07 kg/m.  General Appearance: Casual  Eye Contact:  Fair  Speech:  Normal Rate  Volume:  Normal  Mood:  Anxious  Affect:  Congruent  Thought Process:  Goal Directed and Descriptions of Associations: Intact  Orientation:  Full (Time, Place, and Person)  Thought Content: Delusions and Paranoid Ideation , chronic , on and off, able to cope  Suicidal Thoughts:  No  Homicidal Thoughts:  No  Memory:  Immediate;   Fair Recent;   Fair Remote;   Fair  Judgement:  Fair  Insight:  Fair  Psychomotor Activity:  Normal  Concentration:  Concentration: Fair and Attention Span: Fair  Recall:  [employer] of Knowledge: Fair  Language: Fair  Akathisia:  No  Handed:  Right  AIMS (if indicated): 0  Assets:  Communication Skills Desire for Improvement Housing Intimacy Social Support Talents/Skills  ADL's:  Intact  Cognition: WNL  Sleep:  Fair   Screenings: AIMS     Office Visit from 05/12/2017 in [facility] Office Visit from 07/29/2016 in [facility] Office Visit from 05/06/2016 in [facility] Office Visit from 04/01/2016 in [facility] Office Visit from 03/28/2015 in [facility]  AIMS Total Score  0  0  0  0  0    PHQ2-9     Clinical Support from 01/13/2018 in [facility] Clinical Support from  01/13/2017 in [facility] Office Visit from 09/24/2015 in [facility] Office Visit from 06/20/2015 in [facility] Office Visit from 03/15/2015 in [facility]  PHQ-2 Total Score  2  2  3   0  0  PHQ-9 Total Score  8  12  8   -  -       Assessment and Plan: Sheyanne is a 47 year old Caucasian female who has a history of schizoaffective disorder, anxiety disorder, insomnia, OSA on CPAP who presented to the clinic today for a follow-up visit.     Plan For schizoaffective disorder Invega sustenna 234 mg IM q. 28 days.  She will receive injection today.    For anxiety symptoms Continue Viibryd 40 mg p.o. daily Continue gabapentin 400 mg p.o. 3 times daily.  She also takes it for neuropathy.   I will increase the dose of Invega 6 mg p.o. twice daily in the interim and will start her on Invega Sustenna 234 mg IM in 3 weeks.  Continue Depakote as prescribed  Medications  refilled  Follow up in clinic in 4 weeks or sooner if needed.  More than 50 % of the time was spent for psychoeducation and supportive psychotherapy and care coordination.  This note was generated in part or whole with voice recognition software. Voice recognition is usually quite accurate but there are transcription errors that can and very often do occur. I apologize for any typographical errors that were not detected and corrected.        Brandy Hale, MD 02/28/2018, 1:41 PM

## 2018-03-16 ENCOUNTER — Encounter: Payer: Self-pay | Admitting: Family Medicine

## 2018-03-28 ENCOUNTER — Ambulatory Visit: Payer: Medicare Other

## 2018-03-28 ENCOUNTER — Other Ambulatory Visit: Payer: Self-pay

## 2018-03-28 VITALS — BP 136/81 | HR 93 | Temp 97.9°F | Ht 65.0 in | Wt 233.0 lb

## 2018-03-28 DIAGNOSIS — F259 Schizoaffective disorder, unspecified: Secondary | ICD-10-CM

## 2018-03-28 DIAGNOSIS — F3170 Bipolar disorder, currently in remission, most recent episode unspecified: Secondary | ICD-10-CM

## 2018-03-29 ENCOUNTER — Ambulatory Visit: Payer: Medicare Other | Admitting: Psychiatry

## 2018-03-30 ENCOUNTER — Other Ambulatory Visit: Payer: Self-pay | Admitting: Psychiatry

## 2018-03-30 MED ORDER — PALIPERIDONE PALMITATE ER 156 MG/ML IM SUSY
234.0000 mg | PREFILLED_SYRINGE | Freq: Once | INTRAMUSCULAR | Status: AC
  Administered 2018-03-31: 234 mg via INTRAMUSCULAR

## 2018-03-31 DIAGNOSIS — F259 Schizoaffective disorder, unspecified: Secondary | ICD-10-CM

## 2018-03-31 DIAGNOSIS — F3170 Bipolar disorder, currently in remission, most recent episode unspecified: Secondary | ICD-10-CM | POA: Diagnosis not present

## 2018-04-06 ENCOUNTER — Other Ambulatory Visit: Payer: Self-pay | Admitting: Family Medicine

## 2018-04-06 ENCOUNTER — Other Ambulatory Visit: Payer: Self-pay | Admitting: Psychiatry

## 2018-04-06 DIAGNOSIS — F251 Schizoaffective disorder, depressive type: Secondary | ICD-10-CM

## 2018-04-06 DIAGNOSIS — E119 Type 2 diabetes mellitus without complications: Secondary | ICD-10-CM

## 2018-04-06 DIAGNOSIS — Z794 Long term (current) use of insulin: Secondary | ICD-10-CM

## 2018-04-12 ENCOUNTER — Telehealth: Payer: Self-pay | Admitting: Family Medicine

## 2018-04-12 NOTE — Telephone Encounter (Signed)
glucometer(ACCU-CHEK AVIVA PLUS) Pts meter is out of order and no longer working  Development worker, international aidequesting new meter  Preferred Pharmacy (with phone number or street name[id number] - [place], [place] - [address] [facility] [address]       [phone] (Phone) [phone] (Fax)

## 2018-04-12 NOTE — Telephone Encounter (Signed)
Copied from CRM [id number]. Topic: Quick Communication - Rx Refill/Question >> Apr 12, 2018  2:37 PM Wyonia HoughJohnson, Chaz E wrote: Medication: glucometer(ACCU-CHEK AVIVA PLUS) Pts meter is out of order and no longer working   Has the patient contacted their pharmacy? No.  Preferred Pharmacy (with phone number or street name): [id number] - [place], [place] - [address] [facility] [address] [phone] (Phone) [phone] (Fax)    Agent: Please be advised that RX refills may take up to 3 business days. We ask that you follow-up with your pharmacy.

## 2018-04-13 NOTE — Telephone Encounter (Signed)
Order form filled out. [facility] get provider signature [facility]

## 2018-04-13 NOTE — Telephone Encounter (Signed)
Form signed by Fleet Contrasachel and [facility]. Called and left patient a VM letting her know this.

## 2018-04-25 ENCOUNTER — Encounter: Payer: Self-pay | Admitting: Family Medicine

## 2018-04-28 ENCOUNTER — Encounter: Payer: Self-pay | Admitting: Family Medicine

## 2018-04-28 ENCOUNTER — Other Ambulatory Visit: Payer: Self-pay | Admitting: Family Medicine

## 2018-04-28 ENCOUNTER — Other Ambulatory Visit: Payer: Self-pay | Admitting: Psychiatry

## 2018-04-28 ENCOUNTER — Ambulatory Visit (INDEPENDENT_AMBULATORY_CARE_PROVIDER_SITE_OTHER): Payer: Medicare Other | Admitting: Family Medicine

## 2018-04-28 ENCOUNTER — Ambulatory Visit: Payer: Medicare Other

## 2018-04-28 VITALS — BP 115/77 | HR 76 | Temp 97.6°F | Ht 63.39 in | Wt 232.0 lb

## 2018-04-28 DIAGNOSIS — F251 Schizoaffective disorder, depressive type: Secondary | ICD-10-CM

## 2018-04-28 DIAGNOSIS — J4520 Mild intermittent asthma, uncomplicated: Secondary | ICD-10-CM

## 2018-04-28 DIAGNOSIS — E7800 Pure hypercholesterolemia, unspecified: Secondary | ICD-10-CM

## 2018-04-28 DIAGNOSIS — E1159 Type 2 diabetes mellitus with other circulatory complications: Secondary | ICD-10-CM

## 2018-04-28 DIAGNOSIS — Z0000 Encounter for general adult medical examination without abnormal findings: Secondary | ICD-10-CM | POA: Diagnosis not present

## 2018-04-28 DIAGNOSIS — Z794 Long term (current) use of insulin: Secondary | ICD-10-CM

## 2018-04-28 DIAGNOSIS — I10 Essential (primary) hypertension: Secondary | ICD-10-CM

## 2018-04-28 DIAGNOSIS — Z6841 Body Mass Index (BMI) 40.0 and over, adult: Secondary | ICD-10-CM

## 2018-04-28 DIAGNOSIS — E1140 Type 2 diabetes mellitus with diabetic neuropathy, unspecified: Secondary | ICD-10-CM | POA: Diagnosis not present

## 2018-04-28 DIAGNOSIS — Z1239 Encounter for other screening for malignant neoplasm of breast: Secondary | ICD-10-CM

## 2018-04-28 LAB — MICROSCOPIC EXAMINATION: Bacteria, UA: NONE SEEN

## 2018-04-28 LAB — UA/M W/RFLX CULTURE, ROUTINE
Bilirubin, UA: NEGATIVE
KETONES UA: NEGATIVE
Leukocytes, UA: NEGATIVE
Nitrite, UA: NEGATIVE
Protein, UA: NEGATIVE
RBC, UA: NEGATIVE
Specific Gravity, UA: 1.01 (ref 1.005–1.030)
Urobilinogen, Ur: 0.2 mg/dL (ref 0.2–1.0)
pH, UA: 5.5 (ref 5.0–7.5)

## 2018-04-28 MED ORDER — GABAPENTIN 600 MG PO TABS: 600.0000 mg | ORAL_TABLET | Freq: Three times a day (TID) | ORAL | 3 refills | Status: DC

## 2018-04-28 NOTE — Telephone Encounter (Signed)
Requested medication (s) are due for refill today -yes  Requested medication (s) are on the active medication list -yes  Future visit scheduled -patient has appointment today  Last refill: 10/22/17  Notes to clinic: Patient has appointment today- sent for review and refill today at appointment  Requested Prescriptions  Pending Prescriptions Disp Refills   JARDIANCE 25 MG TABS tablet [Pharmacy Med Name: JARDIANCE 25MG TABLETS] 90 tablet 1    Sig: TAKE 1 TABLET BY MOUTH DAILY     Endocrinology:  Diabetes - SGLT2 Inhibitors Failed - 04/28/2018  8:48 AM      Failed - HBA1C is between 0 and 7.9 and within 180 days    HB A1C (BAYER DCA - WAIVED)  Date Value Ref Range Status  10/22/2017 6.9 <7.0 % Final    Comment:                                          Diabetic Adult            <7.0                                       Healthy Adult        4.3 - 5.7                                                           (DCCT/NGSP) American Diabetes Association's Summary of Glycemic Recommendations for Adults with Diabetes: Hemoglobin A1c <7.0%. More stringent glycemic goals (A1c <6.0%) may further reduce complications at the cost of increased risk of hypoglycemia.          Passed - Cr in normal range and within 360 days    Creat  Date Value Ref Range Status  09/26/2015 0.73 0.50 - 1.10 mg/dL Final   Creatinine, Ser  Date Value Ref Range Status  10/22/2017 0.83 0.57 - 1.00 mg/dL Final         Passed - LDL in normal range and within 360 days    LDL Calculated  Date Value Ref Range Status  10/22/2017 51 0 - 99 mg/dL Final         Passed - eGFR in normal range and within 360 days    GFR calc Af Amer  Date Value Ref Range Status  10/22/2017 98 >59 mL/min/1.73 Final   GFR calc non Af Amer  Date Value Ref Range Status  10/22/2017 85 >59 mL/min/1.73 Final         Passed - Valid encounter within last 6 months    Recent Outpatient Visits          6 months ago Essential hypertension   [place], Megan P, DO   9 months ago Controlled type 2 diabetes mellitus without complication, unspecified whether long term insulin use [facility])   [place], [place], [place]   11 months ago Acute bacterial conjunctivitis of right eye   [facility] Volney American, [place]   11 months ago Acute bacterial conjunctivitis of right eye   [facility] Volney American, [place]   1  year ago Controlled type 2 diabetes mellitus without complication, unspecified whether long term insulin use ([place])   [place], Lilia Argue, [place]      Future Appointments            Today Volney American, PA-C [place], [facility]   In 8 months  [employer], [facility]            Requested Prescriptions  Pending Prescriptions Disp Refills   JARDIANCE 25 MG TABS tablet [Pharmacy Med Name: JARDIANCE 25MG TABLETS] 90 tablet 1    Sig: TAKE 1 TABLET BY MOUTH DAILY     Endocrinology:  Diabetes - SGLT2 Inhibitors Failed - 04/28/2018  8:48 AM      Failed - HBA1C is between 0 and 7.9 and within 180 days    HB A1C (BAYER DCA - WAIVED)  Date Value Ref Range Status  10/22/2017 6.9 <7.0 % Final    Comment:                                          Diabetic Adult            <7.0                                       Healthy Adult        4.3 - 5.7                                                           (DCCT/NGSP) American Diabetes Association's Summary of Glycemic Recommendations for Adults with Diabetes: Hemoglobin A1c <7.0%. More stringent glycemic goals (A1c <6.0%) may further reduce complications at the cost of increased risk of hypoglycemia.          Passed - Cr in normal range and within 360 days    Creat  Date Value Ref Range Status  09/26/2015 0.73 0.50 - 1.10 mg/dL Final   Creatinine, Ser  Date Value Ref Range Status  10/22/2017 0.83 0.57 - 1.00 mg/dL Final         Passed  - LDL in normal range and within 360 days    LDL Calculated  Date Value Ref Range Status  10/22/2017 51 0 - 99 mg/dL Final         Passed - eGFR in normal range and within 360 days    GFR calc Af Amer  Date Value Ref Range Status  10/22/2017 98 >59 mL/min/1.73 Final   GFR calc non Af Amer  Date Value Ref Range Status  10/22/2017 85 >59 mL/min/1.73 Final         Passed - Valid encounter within last 6 months    Recent Outpatient Visits          6 months ago Essential hypertension   [place], Megan P, DO   9 months ago Controlled type 2 diabetes mellitus without complication, unspecified whether long term insulin use [facility])   [facility] Volney American, [place]   11 months ago Acute bacterial conjunctivitis of right eye   [facility] Merrie Roof  Benjamine Mola, PA-C   11 months ago Acute bacterial conjunctivitis of right eye   [facility] Merrie Roof [place], [place]   1 year ago Controlled type 2 diabetes mellitus without complication, unspecified whether long term insulin use [facility])   [place], Lilia Argue, [place]      Future Appointments            Today Orene Desanctis, L[place] Argue, PA-C [facility], [place]   In 8 months  [employer], [place]

## 2018-04-28 NOTE — Telephone Encounter (Signed)
Requested medication (s) are due for refill today -yes  Requested medication (s) are on the active medication list -yes  Future visit scheduled -yes  Last refill: 10/22/17  Notes to clinic: Patient has an appointment today- sent for provider to refill at appointment today.  Requested Prescriptions  Pending Prescriptions Disp Refills   hydrochlorothiazide (HYDRODIURIL) 12.5 MG tablet [Pharmacy Med Name: HYDROCHLOROTHIAZIDE 12.5MG  TABLETS] 90 tablet 1    Sig: TAKE 1 TABLET(12.5 MG) BY MOUTH DAILY     Cardiovascular: Diuretics - Thiazide Passed - 04/28/2018  8:45 AM      Passed - Ca in normal range and within 360 days    Calcium  Date Value Ref Range Status  10/22/2017 10.1 8.7 - 10.2 mg/dL Final         Passed - Cr in normal range and within 360 days    Creat  Date Value Ref Range Status  09/26/2015 0.73 0.50 - 1.10 mg/dL Final   Creatinine, Ser  Date Value Ref Range Status  10/22/2017 0.83 0.57 - 1.00 mg/dL Final         Passed - K in normal range and within 360 days    Potassium  Date Value Ref Range Status  10/22/2017 4.4 3.5 - 5.2 mmol/L Final         Passed - Na in normal range and within 360 days    Sodium  Date Value Ref Range Status  10/22/2017 140 134 - 144 mmol/L Final         Passed - Last BP in normal range    BP Readings from Last 1 Encounters:  01/13/18 130/68         Passed - Valid encounter within last 6 months    Recent Outpatient Visits          6 months ago Essential hypertension   [facility] [place], Megan P, DO   9 months ago Controlled type 2 diabetes mellitus without complication, unspecified whether long term insulin use (HCC)   [facility] Particia Nearing, [place]   11 months ago Acute bacterial conjunctivitis of right eye   [facility] Roosvelt Maser [place], [place]   11 months ago Acute bacterial conjunctivitis of right eye   [facility] [place], [place], [place]   1 year ago  Controlled type 2 diabetes mellitus without complication, unspecified whether long term insulin use [facility])   [facility] [place], Salley Hews, [place]      Future Appointments            Today Particia Nearing, PA-C [facility], [facility]   In 8 months  [facility], [facility]            Requested Prescriptions  Pending Prescriptions Disp Refills   hydrochlorothiazide (HYDRODIURIL) 12.5 MG tablet [Pharmacy Med Name: HYDROCHLOROTHIAZIDE 12.5MG  TABLETS] 90 tablet 1    Sig: TAKE 1 TABLET(12.5 MG) BY MOUTH DAILY     Cardiovascular: Diuretics - Thiazide Passed - 04/28/2018  8:45 AM      Passed - Ca in normal range and within 360 days    Calcium  Date Value Ref Range Status  10/22/2017 10.1 8.7 - 10.2 mg/dL Final         Passed - Cr in normal range and within 360 days    Creat  Date Value Ref Range Status  09/26/2015 0.73 0.50 - 1.10 mg/dL Final   Creatinine, Ser  Date Value Ref Range Status  10/22/2017  0.83 0.57 - 1.00 mg/dL Final         Passed - K in normal range and within 360 days    Potassium  Date Value Ref Range Status  10/22/2017 4.4 3.5 - 5.2 mmol/L Final         Passed - Na in normal range and within 360 days    Sodium  Date Value Ref Range Status  10/22/2017 140 134 - 144 mmol/L Final         Passed - Last BP in normal range    BP Readings from Last 1 Encounters:  01/13/18 130/68         Passed - Valid encounter within last 6 months    Recent Outpatient Visits          6 months ago Essential hypertension   [facility] [place], Megan P, DO   9 months ago Controlled type 2 diabetes mellitus without complication, unspecified whether long term insulin use [facility])   [facility] Roosvelt Maser [place], [place]   11 months ago Acute bacterial conjunctivitis of right eye   [facility] Roosvelt Maser [place], [place]   11 months ago Acute bacterial conjunctivitis of right eye   [facility] Roosvelt Maser [place], [place]   1 year ago Controlled type 2 diabetes mellitus without complication, unspecified whether long term insulin use [facility])   [facility], Salley Hews, [place]      Future Appointments            Today Maurice March, [place] Hews, PA-C [facility], [facility]   In 8 months  [employer], [facility]

## 2018-04-28 NOTE — Progress Notes (Signed)
BP 115/77   Pulse 76   Temp 97.6 F (36.4 C) (Oral)   Ht 5' 3.39" (1.61 m)   Wt 232 lb (105.2 kg)   SpO2 98%   BMI 40.60 kg/m    Subjective:    Patient ID: Tabitha [name], female    DOB: 21-Dec-1970, 48 y.o.   MRN: [id number]  HPI: Tabitha [name] is a 48 y.o. female presenting on 04/28/2018 for comprehensive medical examination. Current medical complaints include:see below  Has been working hard on diet and exercise, backslid a bit for the holidays but committed to getting back good habits. Taking her medicines faithfully without side effects. Denies low blood sugar spells. Taking 40 units levemir BID, actos, jardiance, metformin. Taking 400 mg TID gabapentin for her neuropathy which does help but still having sxs particularly in left foot.   BPs stable on current regimen, checking at home and getting 110-120/80s. Denies CP, SOB, dizziness, HAs.   Taking lipitor for cholesterol management. Has been working hard on diet. Tolerating well.   Breathing stable with albuterol prn.   Followed by Psychiatry for schizoaffective disorder and OCD. Stable and feels things are going very well there.   She currently lives with: Menopausal Symptoms: no  Depression Screen done today and results listed below:  Depression screen [facility] 2/9 04/28/2018 01/13/2018 01/13/2017 09/24/2015 06/20/2015  Decreased Interest 2 1 0 1 0  Down, Depressed, Hopeless 2 1 2 2  0  PHQ - 2 Score 4 2 2 3  0  Altered sleeping 1 1 2 1  -  Tired, decreased energy 2 1 2  0 -  Change in appetite 2 1 2 2  -  Feeling bad or failure about yourself  1 1 2 2  -  Trouble concentrating 1 1 2  0 -  Moving slowly or fidgety/restless 0 1 0 0 -  Suicidal thoughts 0 0 0 0 -  PHQ-9 Score 11 8 12 8  -  Difficult doing work/chores - Not difficult at all Somewhat difficult Somewhat difficult -  Some encounter information is confidential and restricted. Go to Review Flowsheets activity to see all data.    The patient does not have a history of  falls. I did complete a risk assessment for falls. A plan of care for falls was documented.   Past Medical History:  Past Medical History:  Diagnosis Date  . Allergic rhinitis   . Anxiety   . Asthma   . Bipolar disorder (HCC)   . Depression   . Diabetes mellitus without complication (HCC)   . GERD (gastroesophageal reflux disease)   . Hyperlipidemia   . Mild sleep apnea   . OCD (obsessive compulsive disorder)   . Palpitations   . Paranoia (HCC)   . Schizoaffective disorder (HCC)   . Sleep apnea     Surgical History:  Past Surgical History:  Procedure Laterality Date  . CESAREAN SECTION  2006/2008  . CHOLECYSTECTOMY  2006    Medications:  Current Outpatient Medications on File Prior to Visit  Medication Sig  . albuterol (PROAIR HFA) 108 (90 Base) MCG/ACT inhaler Inhale 2 puffs into the lungs every 6 (six) hours as needed for wheezing or shortness of breath.  Marland Kitchen amantadine (SYMMETREL) 100 MG capsule Take 1 capsule (100 mg total) by mouth 2 (two) times daily.  Marland Kitchen atorvastatin (LIPITOR) 20 MG tablet Take 1 tablet (20 mg total) by mouth at bedtime.  . divalproex (DEPAKOTE) 250 MG DR tablet Take 1 tablet (250 mg total) by mouth every  morning.  . divalproex (DEPAKOTE) 500 MG DR tablet TAKE 1 TABLET BY MOUTH EVERY EVENING  . doxycycline (VIBRAMYCIN) 50 MG capsule Take 1 capsule (50 mg total) by mouth every morning.  . fluticasone (FLOVENT HFA) 110 MCG/ACT inhaler Inhale 1 puff into the lungs 2 (two) times daily.  Marland Kitchen glucose blood (ACCU-CHEK AVIVA PLUS) test strip 1 each by Other route as needed for other. Use as instructed  . Insulin Detemir (LEVEMIR) 100 UNIT/ML Pen Inject 40 Units into the skin 2 (two) times daily.  . Insulin Pen Needle (BD PEN NEEDLE NANO U/F) 32G X 4 MM MISC 1 each by Does not apply route daily.  . metFORMIN (GLUCOPHAGE) 500 MG tablet TAKE 2 TABLETS BY MOUTH TWICE DAILY BEFORE A MEAL  . NOVOTWIST 32G X 5 MM MISC USE WITH LEVEMIR PEN BID  . omeprazole (PRILOSEC)  10 MG capsule Take 1 capsule (10 mg total) by mouth daily.  . paliperidone (INVEGA) 6 MG 24 hr tablet Take 1 tablet (6 mg total) by mouth 2 (two) times daily.  . pioglitazone (ACTOS) 45 MG tablet Take 1 tablet (45 mg total) by mouth daily.  . Vilazodone HCl (VIIBRYD) 40 MG TABS Take 1 tablet (40 mg total) by mouth every morning.   No current facility-administered medications on file prior to visit.     Allergies:  Allergies  Allergen Reactions  . Abilify [Aripiprazole] Hives    Social History:  Social History   Socioeconomic History  . Marital status: Married    Spouse name: kevin  . Number of children: 2  . Years of education: Not on file  . Highest education level: Bachelor's degree (e.g., BA, AB, BS)  Occupational History  . Not on file  Social Needs  . Financial resource strain: Not hard at all  . Food insecurity:    Worry: Sometimes true    Inability: Sometimes true  . Transportation needs:    Medical: No    Non-medical: No  Tobacco Use  . Smoking status: Never Smoker  . Smokeless tobacco: Never Used  Substance and Sexual Activity  . Alcohol use: No    Alcohol/week: 0.0 standard drinks  . Drug use: No  . Sexual activity: Yes    Partners: Male    Birth control/protection: None, Surgical  Lifestyle  . Physical activity:    Days per week: 0 days    Minutes per session: 0 min  . Stress: To some extent  Relationships  . Social connections:    Talks on phone: More than three times a week    Gets together: More than three times a week    Attends religious service: More than 4 times per year    Active member of club or organization: Yes    Attends meetings of clubs or organizations: More than 4 times per year    Relationship status: Married  . Intimate partner violence:    Fear of current or ex partner: No    Emotionally abused: No    Physically abused: No    Forced sexual activity: No  Other Topics Concern  . Not on file  Social History Narrative  . Not  on file   Social History   Tobacco Use  Smoking Status Never Smoker  Smokeless Tobacco Never Used   Social History   Substance and Sexual Activity  Alcohol Use No  . Alcohol/week: 0.0 standard drinks    Family History:  Family History  Problem Relation Age of Onset  .  Diabetes Father   . Hyperlipidemia Father   . Diabetes Brother   . Lupus Mother   . Diabetes Mother        pre diabetes    Past medical history, surgical history, medications, allergies, family history and social history reviewed with patient today and changes made to appropriate areas of the chart.   Review of Systems - General ROS: negative Psychological ROS: negative Ophthalmic ROS: negative ENT ROS: negative Allergy and Immunology ROS: negative Hematological and Lymphatic ROS: negative Endocrine ROS: negative Breast ROS: negative for breast lumps Respiratory ROS: no cough, shortness of breath, or wheezing Cardiovascular ROS: no chest pain or dyspnea on exertion Gastrointestinal ROS: no abdominal pain, change in bowel habits, or black or bloody stools Genito-Urinary ROS: no dysuria, trouble voiding, or hematuria Musculoskeletal ROS: negative Neurological ROS: no TIA or stroke symptoms Dermatological ROS: negative All other ROS negative except what is listed above and in the HPI.      Objective:    BP 115/77   Pulse 76   Temp 97.6 F (36.4 C) (Oral)   Ht 5' 3.39" (1.61 m)   Wt 232 lb (105.2 kg)   SpO2 98%   BMI 40.60 kg/m   Wt Readings from Last 3 Encounters:  04/28/18 232 lb (105.2 kg)  01/13/18 224 lb 14.4 oz (102 kg)  10/22/17 225 lb 9 oz (102.3 kg)    Physical Exam Vitals signs and nursing note reviewed.  Constitutional:      General: She is not in acute distress.    Appearance: She is well-developed.  HENT:     Head: Atraumatic.     Right Ear: External ear normal.     Left Ear: External ear normal.     Nose: Nose normal.     Mouth/Throat:     Pharynx: No oropharyngeal  exudate.  Eyes:     General: No scleral icterus.    Conjunctiva/sclera: Conjunctivae normal.     Pupils: Pupils are equal, round, and reactive to light.  Neck:     Musculoskeletal: Normal range of motion and neck supple.     Thyroid: No thyromegaly.  Cardiovascular:     Rate and Rhythm: Normal rate and regular rhythm.     Heart sounds: Normal heart sounds.  Pulmonary:     Effort: Pulmonary effort is normal. No respiratory distress.     Breath sounds: Normal breath sounds.  Chest:     Breasts:        Right: No mass, nipple discharge, skin change or tenderness.        Left: No mass, nipple discharge, skin change or tenderness.  Abdominal:     General: Bowel sounds are normal.     Palpations: Abdomen is soft. There is no mass.     Tenderness: There is no abdominal tenderness.  Genitourinary:    Comments: Exam declined Musculoskeletal: Normal range of motion.        General: No tenderness.  Lymphadenopathy:     Cervical: No cervical adenopathy.  Skin:    General: Skin is warm and dry.     Findings: No rash.  Neurological:     Mental Status: She is alert and oriented to person, place, and time.     Cranial Nerves: No cranial nerve deficit.  Psychiatric:        Behavior: Behavior normal.     Diabetic Foot Exam - Simple   Simple Foot Form Diabetic Foot exam was performed with the following findings:  Yes 04/28/2018  2:50 PM  Visual Inspection No deformities, no ulcerations, no other skin breakdown bilaterally:  Yes Sensation Testing See comments:  Yes Pulse Check Posterior Tibialis and Dorsalis pulse intact bilaterally:  Yes Comments Decreased sensation to filament plantar surface of left foot, mostly intact otherwise b/l     Results for orders placed or performed in visit on 04/28/18  Microscopic Examination  Result Value Ref Range   WBC, UA 0-5 0 - 5 /hpf   RBC, UA 0-2 0 - 2 /hpf   Epithelial Cells (non renal) 0-10 0 - 10 /hpf   Renal Epithel, UA 0-10 (A) None seen  /hpf   Bacteria, UA None seen None seen/Few  CBC with Differential/Platelet  Result Value Ref Range   WBC 6.4 3.4 - 10.8 x10E3/uL   RBC 4.61 3.77 - 5.28 x10E6/uL   Hemoglobin 13.3 11.1 - 15.9 g/dL   Hematocrit 16.1 09.6 - 46.6 %   MCV 86 79 - 97 fL   MCH 28.9 26.6 - 33.0 pg   MCHC 33.6 31.5 - 35.7 g/dL   RDW 04.5 40.9 - 81.1 %   Platelets 201 150 - 450 x10E3/uL   Neutrophils 54 Not Estab. %   Lymphs 37 Not Estab. %   Monocytes 6 Not Estab. %   Eos 2 Not Estab. %   Basos 1 Not Estab. %   Neutrophils Absolute 3.5 1.4 - 7.0 x10E3/uL   Lymphocytes Absolute 2.4 0.7 - 3.1 x10E3/uL   Monocytes Absolute 0.4 0.1 - 0.9 x10E3/uL   EOS (ABSOLUTE) 0.1 0.0 - 0.4 x10E3/uL   Basophils Absolute 0.0 0.0 - 0.2 x10E3/uL   Immature Granulocytes 0 Not Estab. %   Immature Grans (Abs) 0.0 0.0 - 0.1 x10E3/uL  Comprehensive metabolic panel  Result Value Ref Range   Glucose 150 (H) 65 - 99 mg/dL   BUN 14 6 - 24 mg/dL   Creatinine, Ser 9.14 0.57 - 1.00 mg/dL   GFR calc non Af Amer 90 >59 mL/min/1.73   GFR calc Af Amer 104 >59 mL/min/1.73   BUN/Creatinine Ratio 18 9 - 23   Sodium 139 134 - 144 mmol/L   Potassium 4.0 3.5 - 5.2 mmol/L   Chloride 95 (L) 96 - 106 mmol/L   CO2 24 20 - 29 mmol/L   Calcium 10.1 8.7 - 10.2 mg/dL   Total Protein 6.6 6.0 - 8.5 g/dL   Albumin 4.4 3.5 - 5.5 g/dL   Globulin, Total 2.2 1.5 - 4.5 g/dL   Albumin/Globulin Ratio 2.0 1.2 - 2.2   Bilirubin Total 0.3 0.0 - 1.2 mg/dL   Alkaline Phosphatase 86 39 - 117 IU/L   AST 14 0 - 40 IU/L   ALT 13 0 - 32 IU/L  Lipid Panel w/o Chol/HDL Ratio  Result Value Ref Range   Cholesterol, Total 168 100 - 199 mg/dL   Triglycerides 782 (H) 0 - 149 mg/dL   HDL 53 >95 mg/dL   VLDL Cholesterol Cal 52 (H) 5 - 40 mg/dL   LDL Calculated 63 0 - 99 mg/dL  TSH  Result Value Ref Range   TSH 1.290 0.450 - 4.500 uIU/mL  UA/M w/rflx Culture, Routine  Result Value Ref Range   Specific Gravity, UA 1.010 1.005 - 1.030   pH, UA 5.5 5.0 - 7.5    Color, UA Yellow Yellow   Appearance Ur Clear Clear   Leukocytes, UA Negative Negative   Protein, UA Negative Negative/Trace   Glucose, UA 2+ (A) Negative  Ketones, UA Negative Negative   RBC, UA Negative Negative   Bilirubin, UA Negative Negative   Urobilinogen, Ur 0.2 0.2 - 1.0 mg/dL   Nitrite, UA Negative Negative   Microscopic Examination See below:   HgB A1c  Result Value Ref Range   Hgb A1c MFr Bld 6.2 (H) 4.8 - 5.6 %   Est. average glucose Bld gHb Est-mCnc 131 mg/dL      Assessment & Plan:   Problem List Items Addressed This Visit      Cardiovascular and Mediastinum   Hypertension associated with diabetes (HCC)    Stable and WNL, continue current regimen      Relevant Orders   CBC with Differential/Platelet (Completed)   TSH (Completed)     Respiratory   Asthma    Stable on prn albuterol. Continue current regimen        Endocrine   Controlled type 2 diabetes mellitus with diabetic neuropathy, with long-term current use of insulin (HCC) - Primary    Recheck A1C and adjust as needed      Relevant Orders   Comprehensive metabolic panel (Completed)   UA/M w/rflx Culture, Routine (Completed)   HgB A1c (Completed)     Other   Hyperlipidemia    Stable, recheck lipids and adjust as needed      Relevant Orders   Lipid Panel w/o Chol/HDL Ratio (Completed)   Morbid obesity (HCC)    Lifestyle modifications reviewed, continue working on diet and exercise      BMI 40.0-44.9, adult [facility])    Other Visit Diagnoses    Annual physical exam       Screening for breast cancer       Relevant Orders   MM DIGITAL SCREENING BILATERAL       Follow up plan: Return in about 6 months (around 10/27/2018) for 6 month f/u.   LABORATORY TESTING:  - Pap smear: up to date  IMMUNIZATIONS:   - Tdap: Tetanus vaccination status reviewed: last tetanus booster within 10 years. - Influenza: Up to date - Pneumovax: Up to date - Prevnar: Not applicable - HPV: Not applicable -  Zostavax vaccine: Not applicable  SCREENING: -Mammogram: Ordered today   PATIENT COUNSELING:   Advised to take 1 mg of folate supplement per day if capable of pregnancy.   Sexuality: Discussed sexually transmitted diseases, partner selection, use of condoms, avoidance of unintended pregnancy  and contraceptive alternatives.   Advised to avoid cigarette smoking.  I discussed with the patient that most people either abstain from alcohol or drink within safe limits (<=14/week and <=4 drinks/occasion for males, <=7/weeks and <= 3 drinks/occasion for females) and that the risk for alcohol disorders and other health effects rises proportionally with the number of drinks per week and how often a drinker exceeds daily limits.  Discussed cessation/primary prevention of drug use and availability of treatment for abuse.   Diet: Encouraged to adjust caloric intake to maintain  or achieve ideal body weight, to reduce intake of dietary saturated fat and total fat, to limit sodium intake by avoiding high sodium foods and not adding table salt, and to maintain adequate dietary potassium and calcium preferably from fresh fruits, vegetables, and low-fat dairy products.    stressed the importance of regular exercise  Injury prevention: Discussed safety belts, safety helmets, smoke detector, smoking near bedding or upholstery.   Dental health: Discussed importance of regular tooth brushing, flossing, and dental visits.    NEXT PREVENTATIVE PHYSICAL DUE IN 1 YEAR. Return  in about 6 months (around 10/27/2018) for 6 month f/u.

## 2018-04-29 LAB — LIPID PANEL W/O CHOL/HDL RATIO
Cholesterol, Total: 168 mg/dL (ref 100–199)
HDL: 53 mg/dL (ref >39)
LDL CALC: 63 mg/dL (ref 0–99)
Triglycerides: 261 mg/dL — ABNORMAL HIGH (ref 0–149)
VLDL Cholesterol Cal: 52 mg/dL — ABNORMAL HIGH (ref 5–40)

## 2018-04-29 LAB — COMPREHENSIVE METABOLIC PANEL
ALT: 13 IU/L (ref 0–32)
AST: 14 IU/L (ref 0–40)
Albumin/Globulin Ratio: 2 (ref 1.2–2.2)
Albumin: 4.4 g/dL (ref 3.5–5.5)
Alkaline Phosphatase: 86 IU/L (ref 39–117)
BUN/Creatinine Ratio: 18 (ref 9–23)
BUN: 14 mg/dL (ref 6–24)
Bilirubin Total: 0.3 mg/dL (ref 0.0–1.2)
CALCIUM: 10.1 mg/dL (ref 8.7–10.2)
CO2: 24 mmol/L (ref 20–29)
CREATININE: 0.79 mg/dL (ref 0.57–1.00)
Chloride: 95 mmol/L — ABNORMAL LOW (ref 96–106)
GFR, EST AFRICAN AMERICAN: 104 mL/min/{1.73_m2} (ref >59)
GFR, EST NON AFRICAN AMERICAN: 90 mL/min/{1.73_m2} (ref >59)
Globulin, Total: 2.2 g/dL (ref 1.5–4.5)
Glucose: 150 mg/dL — ABNORMAL HIGH (ref 65–99)
Potassium: 4 mmol/L (ref 3.5–5.2)
Sodium: 139 mmol/L (ref 134–144)
Total Protein: 6.6 g/dL (ref 6.0–8.5)

## 2018-04-29 LAB — HEMOGLOBIN A1C
Est. average glucose Bld gHb Est-mCnc: 131 mg/dL
HEMOGLOBIN A1C: 6.2 % — AB (ref 4.8–5.6)

## 2018-04-29 LAB — CBC WITH DIFFERENTIAL/PLATELET
BASOS ABS: 0 10*3/uL (ref 0.0–0.2)
Basos: 1 %
EOS (ABSOLUTE): 0.1 10*3/uL (ref 0.0–0.4)
Eos: 2 %
Hematocrit: 39.6 % (ref 34.0–46.6)
Hemoglobin: 13.3 g/dL (ref 11.1–15.9)
Immature Grans (Abs): 0 10*3/uL (ref 0.0–0.1)
Immature Granulocytes: 0 %
LYMPHS: 37 %
Lymphocytes Absolute: 2.4 10*3/uL (ref 0.7–3.1)
MCH: 28.9 pg (ref 26.6–33.0)
MCHC: 33.6 g/dL (ref 31.5–35.7)
MCV: 86 fL (ref 79–97)
Monocytes Absolute: 0.4 10*3/uL (ref 0.1–0.9)
Monocytes: 6 %
Neutrophils Absolute: 3.5 10*3/uL (ref 1.4–7.0)
Neutrophils: 54 %
Platelets: 201 10*3/uL (ref 150–450)
RBC: 4.61 x10E6/uL (ref 3.77–5.28)
RDW: 15.1 % (ref 12.3–15.4)
WBC: 6.4 10*3/uL (ref 3.4–10.8)

## 2018-04-29 LAB — TSH: TSH: 1.29 u[IU]/mL (ref 0.450–4.500)

## 2018-04-30 DIAGNOSIS — Z6841 Body Mass Index (BMI) 40.0 and over, adult: Secondary | ICD-10-CM | POA: Insufficient documentation

## 2018-04-30 NOTE — Assessment & Plan Note (Signed)
Stable, recheck lipids and adjust as needed 

## 2018-04-30 NOTE — Assessment & Plan Note (Signed)
Stable and WNL, continue current regimen 

## 2018-04-30 NOTE — Assessment & Plan Note (Signed)
Recheck A1C and adjust as needed

## 2018-04-30 NOTE — Assessment & Plan Note (Signed)
Stable on prn albuterol. Continue current regimen 

## 2018-04-30 NOTE — Assessment & Plan Note (Signed)
Lifestyle modifications reviewed, continue working on diet and exercise

## 2018-05-02 ENCOUNTER — Ambulatory Visit (INDEPENDENT_AMBULATORY_CARE_PROVIDER_SITE_OTHER): Payer: Medicare Other | Admitting: Psychiatry

## 2018-05-02 ENCOUNTER — Encounter: Payer: Self-pay | Admitting: Psychiatry

## 2018-05-02 ENCOUNTER — Ambulatory Visit (HOSPITAL_BASED_OUTPATIENT_CLINIC_OR_DEPARTMENT_OTHER): Payer: Medicare Other

## 2018-05-02 ENCOUNTER — Ambulatory Visit: Payer: Medicare Other

## 2018-05-02 VITALS — BP 132/72 | HR 92 | Ht 62.0 in | Wt 234.0 lb

## 2018-05-02 DIAGNOSIS — F429 Obsessive-compulsive disorder, unspecified: Secondary | ICD-10-CM

## 2018-05-02 DIAGNOSIS — F251 Schizoaffective disorder, depressive type: Secondary | ICD-10-CM | POA: Diagnosis not present

## 2018-05-02 DIAGNOSIS — F259 Schizoaffective disorder, unspecified: Secondary | ICD-10-CM

## 2018-05-02 MED ORDER — PALIPERIDONE PALMITATE ER 234 MG/1.5ML IM SUSY: 234.0000 mg | PREFILLED_SYRINGE | INTRAMUSCULAR | 6 refills | Status: DC

## 2018-05-02 MED ORDER — PALIPERIDONE ER 6 MG PO TB24: 6.0000 mg | ORAL_TABLET | Freq: Two times a day (BID) | ORAL | 2 refills | Status: DC

## 2018-05-02 MED ORDER — PALIPERIDONE PALMITATE ER 156 MG/ML IM SUSY: 156.0000 mg | PREFILLED_SYRINGE | Freq: Once | INTRAMUSCULAR | Status: DC

## 2018-05-02 MED ORDER — DIVALPROEX SODIUM 500 MG PO DR TAB: 500.0000 mg | DELAYED_RELEASE_TABLET | Freq: Every evening | ORAL | 0 refills | Status: DC

## 2018-05-02 MED ORDER — DIVALPROEX SODIUM 250 MG PO DR TAB: 250.0000 mg | DELAYED_RELEASE_TABLET | Freq: Every morning | ORAL | 0 refills | Status: DC

## 2018-05-02 MED ORDER — PALIPERIDONE PALMITATE ER 156 MG/ML IM SUSY
234.0000 mg | PREFILLED_SYRINGE | Freq: Once | INTRAMUSCULAR | Status: AC
  Administered 2018-05-02: 234 mg via INTRAMUSCULAR

## 2018-05-02 MED ORDER — VILAZODONE HCL 40 MG PO TABS: 40.0000 mg | ORAL_TABLET | Freq: Every morning | ORAL | 0 refills | Status: DC

## 2018-05-02 NOTE — Progress Notes (Signed)
Met with patient after her scheduled evaluation with Dr. Gretel Acre to arrange to give patient her due Invega Sustenna 234 mg/1.5 ml IM monthly injection.  [name] prepared as ordered and given to patient in her right upper outer gluteal area.  Patient tolerated due injection without any complaint of pain or discomfort and agreed to return in 30 days for next due injection.  Exp. Date 08/2019.  Patient to call if any problems with medication prior to next appointment.

## 2018-05-02 NOTE — Progress Notes (Signed)
BH MD OP Progress Note  05/02/2018 2:41 PM Tabitha [name]  MRN:  [id number]  Chief Complaint: Follow-up   HPI: Tabitha [name] is a 48 year old married, Caucasian female, who is on SSD, lives in [place], presented to the clinic today for a follow-up visit.  Patient has a history of schizoaffective disorder as well as unspecified OCD symptoms.  Pt reported that she continues to feel better with the injections.  She reported that she was evaluated by her PCP this week and her labs were done.  Patient reported that she does not have any paranoia and her delusions are also improving.  Her family remains supportive.  Patient reported that the gabapentin is also helping with her neuropathy.  Her blood pressure is stable.  Patient appeared calm and alert during the interview.  Patient reported that she is trying to lose weight and her mood is also improving.  She currently denied having any suicidal homicidal ideations or plans.  No perceptual disturbances noted at this time.     Interested in switching to [place]vega Trinza for 3 months injection we discussed about that during the interview and she agreed with the plan.          Patient currently denied having any suicidal homicidal ideations or plans.  Her husband remains supportive.  Patient is able to contract for safety.  She is agreeable to medication adjustment.      She appears alert and oriented during the interview.     Visit Diagnosis:    ICD-10-CM   1. Schizoaffective disorder, unspecified type (HCC) F25.9   2. Obsessive-compulsive disorder, unspecified type F42.9     Past Psychiatric History: History of schizoaffective disorder, OCD.  She used to follow up with [facility] [place] in the past and then with Dr. Garnetta BuddyFaheem.  She denies any suicide attempts.  Denies any inpatient mental health admissions.  Past Medical History:  Past Medical History:  Diagnosis Date  . Allergic rhinitis   . Anxiety   . Asthma   . Bipolar disorder  (HCC)   . Depression   . Diabetes mellitus without complication (HCC)   . GERD (gastroesophageal reflux disease)   . Hyperlipidemia   . Mild sleep apnea   . OCD (obsessive compulsive disorder)   . Palpitations   . Paranoia (HCC)   . Schizoaffective disorder (HCC)   . Sleep apnea     Past Surgical History:  Procedure Laterality Date  . CESAREAN SECTION  2006/2008  . CHOLECYSTECTOMY  2006    Family Psychiatric History: Denies.  Family History:  Family History  Problem Relation Age of Onset  . Diabetes Father   . Hyperlipidemia Father   . Diabetes Brother   . Lupus Mother   . Diabetes Mother        pre diabetes   Substance abuse history: Denies   Social History: Married, lives with her husband.  She is on SSD.  Her husband is also disabled.  He is a double amputee, from a past history of abdominal gangrene which spread.  She has 2 children aged 47 and 47 years old.  They are home schooled at this time. Social History   Socioeconomic History  . Marital status: Married    Spouse name: kevin  . Number of children: 2  . Years of education: Not on file  . Highest education level: Bachelor's degree (e.g., BA, AB, BS)  Occupational History  . Not on file  Social Needs  . Financial resource strain:  Not hard at all  . Food insecurity:    Worry: Sometimes true    Inability: Sometimes true  . Transportation needs:    Medical: No    Non-medical: No  Tobacco Use  . Smoking status: Never Smoker  . Smokeless tobacco: Never Used  Substance and Sexual Activity  . Alcohol use: No    Alcohol/week: 0.0 standard drinks  . Drug use: No  . Sexual activity: Yes    Partners: Male    Birth control/protection: None, Surgical  Lifestyle  . Physical activity:    Days per week: 0 days    Minutes per session: 0 min  . Stress: To some extent  Relationships  . Social connections:    Talks on phone: More than three times a week    Gets together: More than three times a week     Attends religious service: More than 4 times per year    Active member of club or organization: Yes    Attends meetings of clubs or organizations: More than 4 times per year    Relationship status: Married  Other Topics Concern  . Not on file  Social History Narrative  . Not on file    Allergies:  Allergies  Allergen Reactions  . Abilify [Aripiprazole] Hives    Metabolic Disorder Labs: Lab Results  Component Value Date   HGBA1C 6.2 (H) 04/28/2018   Lab Results  Component Value Date   PROLACTIN 84.8 (H) 06/01/2017   PROLACTIN 36.2 (H) 03/26/2016   Lab Results  Component Value Date   CHOL 168 04/28/2018   TRIG 261 (H) 04/28/2018   HDL 53 04/28/2018   CHOLHDL 2.9 09/26/2015   VLDL 51 (H) 04/23/2017   LDLCALC 63 04/28/2018   LDLCALC 51 10/22/2017   Lab Results  Component Value Date   TSH 1.290 04/28/2018   TSH 2.570 06/01/2017    Therapeutic Level Labs: No results found for: LITHIUM Lab Results  Component Value Date   VALPROATE 52 10/22/2017   VALPROATE 43 (L) 06/01/2017   No components found for:  CBMZ  Current Medications: Current Outpatient Medications  Medication Sig Dispense Refill  . albuterol (PROAIR HFA) 108 (90 Base) MCG/ACT inhaler Inhale 2 puffs into the lungs every 6 (six) hours as needed for wheezing or shortness of breath. 8.5 g 6  . amantadine (SYMMETREL) 100 MG capsule Take 1 capsule (100 mg total) by mouth 2 (two) times daily. 60 capsule 6  . atorvastatin (LIPITOR) 20 MG tablet Take 1 tablet (20 mg total) by mouth at bedtime. 90 tablet 1  . divalproex (DEPAKOTE) 250 MG DR tablet Take 1 tablet (250 mg total) by mouth every morning. 90 tablet 0  . divalproex (DEPAKOTE) 500 MG DR tablet TAKE 1 TABLET BY MOUTH EVERY EVENING 90 tablet 0  . doxycycline (VIBRAMYCIN) 50 MG capsule Take 1 capsule (50 mg total) by mouth every morning. 90 capsule 1  . fluticasone (FLOVENT HFA) 110 MCG/ACT inhaler Inhale 1 puff into the lungs 2 (two) times daily. 1 Inhaler  11  . gabapentin (NEURONTIN) 600 MG tablet Take 1 tablet (600 mg total) by mouth 3 (three) times daily. 90 tablet 3  . glucose blood (ACCU-CHEK AVIVA PLUS) test strip 1 each by Other route as needed for other. Use as instructed 100 each 12  . hydrochlorothiazide (HYDRODIURIL) 12.5 MG tablet TAKE 1 TABLET(12.5 MG) BY MOUTH DAILY 90 tablet 1  . Insulin Detemir (LEVEMIR) 100 UNIT/ML Pen Inject 40 Units into the skin  2 (two) times daily. 5 pen 11  . Insulin Pen Needle (BD PEN NEEDLE NANO U/F) 32G X 4 MM MISC 1 each by Does not apply route daily. 100 each 12  . JARDIANCE 25 MG TABS tablet TAKE 1 TABLET BY MOUTH DAILY 90 tablet 1  . metFORMIN (GLUCOPHAGE) 500 MG tablet TAKE 2 TABLETS BY MOUTH TWICE DAILY BEFORE A MEAL 360 tablet 0  . NOVOTWIST 32G X 5 MM MISC USE WITH LEVEMIR PEN BID  5  . omeprazole (PRILOSEC) 10 MG capsule Take 1 capsule (10 mg total) by mouth daily. 90 capsule 1  . paliperidone (INVEGA SUSTENNA) 234 MG/1.5ML SUSY injection Inject 234 mg into the muscle every 30 (thirty) days.     . paliperidone (INVEGA) 6 MG 24 hr tablet Take 1 tablet (6 mg total) by mouth 2 (two) times daily. 60 tablet 2  . pioglitazone (ACTOS) 45 MG tablet Take 1 tablet (45 mg total) by mouth daily. 90 tablet 1  . Vilazodone HCl (VIIBRYD) 40 MG TABS Take 1 tablet (40 mg total) by mouth every morning. 90 tablet 0   No current facility-administered medications for this visit.      Musculoskeletal: Strength & Muscle Tone: within normal limits Gait & Station: normal Patient leans: N/A  Psychiatric Specialty Exam: Review of Systems  Psychiatric/Behavioral: Positive for depression and hallucinations. The patient is nervous/anxious.   All other systems reviewed and are negative.   Blood pressure 132/72, pulse 92, height 5\' 2"  (1.575 m), weight 234 lb (106.1 kg).Body mass index is 42.8 kg/m.  General Appearance: Casual  Eye Contact:  Fair  Speech:  Normal Rate  Volume:  Normal  Mood:  Anxious  Affect:   Congruent  Thought Process:  Goal Directed and Descriptions of Associations: Intact  Orientation:  Full (Time, Place, and Person)  Thought Content: Delusions and Paranoid Ideation , chronic , on and off, able to cope  Suicidal Thoughts:  No  Homicidal Thoughts:  No  Memory:  Immediate;   Fair Recent;   Fair Remote;   Fair  Judgement:  Fair  Insight:  Fair  Psychomotor Activity:  Normal  Concentration:  Concentration: Fair and Attention Span: Fair  Recall:  [employer] of Knowledge: Fair  Language: Fair  Akathisia:  No  Handed:  Right  AIMS (if indicated): 0  Assets:  Communication Skills Desire for Improvement Housing Intimacy Social Support Talents/Skills  ADL's:  Intact  Cognition: WNL  Sleep:  Fair   Screenings: AIMS     Office Visit from 05/12/2017 in [facility] Office Visit from 07/29/2016 in [facility] Office Visit from 05/06/2016 in [facility] Office Visit from 04/01/2016 in [facility] Office Visit from 03/28/2015 in [facility]  AIMS Total Score  0  0  0  0  0    GAD-7     Office Visit from 04/28/2018 in [facility]  Total GAD-7 Score  8    PHQ2-9     Office Visit from 04/28/2018 in [facility] Clinical Support from 01/13/2018 in [facility] Clinical Support from 01/13/2017 in [facility] Office Visit from 09/24/2015 in [facility] Office Visit from 06/20/2015 in [place]  PHQ-2 Total Score  4  2  2  3   0  PHQ-9 Total Score  11  8  12  8   -       Assessment and  Plan: Tabitha [name] is a 48 year old Caucasian female who has a history of schizoaffective disorder, anxiety disorder, insomnia, OSA on CPAP who presented to the clinic today for a follow-up visit.     Plan For schizoaffective disorder Invega sustenna 234 mg IM q. 28 days.  She will  receive injection today.    For anxiety symptoms Continue Viibryd 40 mg p.o. daily Continue gabapentin 600 mg p.o. 3 times daily.  She also takes it for neuropathy.    Invega 6 mg p.o. twice daily  Invega Sustenna 234 mg IM  q4 weeks   Continue Depakote as prescribed  Medications  refilled  Follow up in clinic in 4 weeks or sooner if needed.  More than 50 % of the time was spent for psychoeducation and supportive psychotherapy and care coordination.  This note was generated in part or whole with voice recognition software. Voice recognition is usually quite accurate but there are transcription errors that can and very often do occur. I apologize for any typographical errors that were not detected and corrected.        Brandy HaleUzma Tadeusz Stahl, MD 05/02/2018, 2:41 PM

## 2018-05-22 ENCOUNTER — Other Ambulatory Visit: Payer: Self-pay | Admitting: Family Medicine

## 2018-05-22 DIAGNOSIS — Z794 Long term (current) use of insulin: Secondary | ICD-10-CM

## 2018-05-22 DIAGNOSIS — E119 Type 2 diabetes mellitus without complications: Secondary | ICD-10-CM

## 2018-05-22 DIAGNOSIS — E0800 Diabetes mellitus due to underlying condition with hyperosmolarity without nonketotic hyperglycemic-hyperosmolar coma (NKHHC): Secondary | ICD-10-CM

## 2018-05-30 ENCOUNTER — Ambulatory Visit: Payer: Medicare Other

## 2018-06-06 ENCOUNTER — Encounter: Payer: Self-pay | Admitting: Psychiatry

## 2018-06-06 ENCOUNTER — Other Ambulatory Visit: Payer: Self-pay | Admitting: Family Medicine

## 2018-06-06 ENCOUNTER — Ambulatory Visit: Payer: Medicare Other | Admitting: Psychiatry

## 2018-06-06 ENCOUNTER — Ambulatory Visit (HOSPITAL_BASED_OUTPATIENT_CLINIC_OR_DEPARTMENT_OTHER): Payer: Medicare Other

## 2018-06-06 ENCOUNTER — Other Ambulatory Visit: Payer: Self-pay

## 2018-06-06 VITALS — BP 140/80 | HR 93 | Temp 97.6°F | Ht 62.0 in | Wt 229.0 lb

## 2018-06-06 VITALS — BP 140/80 | HR 93 | Temp 97.6°F | Wt 229.0 lb

## 2018-06-06 DIAGNOSIS — F429 Obsessive-compulsive disorder, unspecified: Secondary | ICD-10-CM | POA: Diagnosis not present

## 2018-06-06 DIAGNOSIS — E119 Type 2 diabetes mellitus without complications: Secondary | ICD-10-CM

## 2018-06-06 DIAGNOSIS — F259 Schizoaffective disorder, unspecified: Secondary | ICD-10-CM | POA: Diagnosis not present

## 2018-06-06 DIAGNOSIS — F3170 Bipolar disorder, currently in remission, most recent episode unspecified: Secondary | ICD-10-CM

## 2018-06-06 MED ORDER — PALIPERIDONE PALMITATE ER 156 MG/ML IM SUSY
234.0000 mg | PREFILLED_SYRINGE | Freq: Once | INTRAMUSCULAR | Status: AC
  Administered 2018-06-06: 234 mg via INTRAMUSCULAR

## 2018-06-06 NOTE — Patient Instructions (Signed)
Met with patient after her scheduled evaluation with Dr. Gretel Acre to arrange to give patient her due Invega Sustenna 234 mg/1.5 ml IM monthly injection.  [name] prepared as ordered and given to patient in her left upper outer gluteal area.  Patient tolerated due injection without any complaint of pain or discomfort and agreed to return in 30 days for next due injection.  Exp. Date 08/2019.  ndc [id number] lot # [id number]  exp@#11-2019 s/n# [id number]

## 2018-06-06 NOTE — Progress Notes (Signed)
BH MD OP Progress Note  06/06/2018 2:47 PM Tabitha [name]  MRN:  [id number]  Chief Complaint: Follow-up   HPI: Tabitha [name] is a 48 year old married, Caucasian female with history of schizoaffective disorder presented for follow-up appointment.  She reported that she continues to have paranoia and chronic secretory delusions.  She appeared tired during the interview.  She reported that she has been feeling very tired as she has started exercising and has been feeling exhausted.  She reported that she feels that people are after her and they will try to take her away from her family.  She reported that the paranoia is not improving although she has been on invega  susttenna as well as oral Invega.  She reported that sometimes she feels that she will jump out of her skin.  She has been compliant with her medications.  Her family is also supportive.  She reported that she sleeps well at night.  She does not have any auditory or visual hallucinations at this time.  She will receive her Invega injection at this time.    She currently denied having any suicidal homicidal ideations or plans.       Husband and her family remains supportive.     Visit Diagnosis:    ICD-10-CM   1. Schizoaffective disorder, unspecified type (HCC) F25.9   2. Obsessive-compulsive disorder, unspecified type F42.9     Past Psychiatric History: History of schizoaffective disorder, OCD.  She used to follow up with [facility] in [place] in the past and then with Dr. Garnetta Buddy.  She denies any suicide attempts.  Denies any inpatient mental health admissions.  Past Medical History:  Past Medical History:  Diagnosis Date  . Allergic rhinitis   . Anxiety   . Asthma   . Bipolar disorder (HCC)   . Depression   . Diabetes mellitus without complication (HCC)   . GERD (gastroesophageal reflux disease)   . Hyperlipidemia   . Mild sleep apnea   . OCD (obsessive compulsive disorder)   . Palpitations   . Paranoia (HCC)    . Schizoaffective disorder (HCC)   . Sleep apnea     Past Surgical History:  Procedure Laterality Date  . CESAREAN SECTION  2006/2008  . CHOLECYSTECTOMY  2006    Family Psychiatric History: Denies.  Family History:  Family History  Problem Relation Age of Onset  . Diabetes Father   . Hyperlipidemia Father   . Diabetes Brother   . Lupus Mother   . Diabetes Mother        pre diabetes   Substance abuse history: Denies   Social History: Married, lives with her husband.  She is on SSD.  Her husband is also disabled.  He is a double amputee, from a past history of abdominal gangrene which spread.  She has 2 children aged 84 and 61 years old.  They are home schooled at this time. Social History   Socioeconomic History  . Marital status: Married    Spouse name: kevin  . Number of children: 2  . Years of education: Not on file  . Highest education level: Bachelor's degree (e.g., BA, AB, BS)  Occupational History  . Not on file  Social Needs  . Financial resource strain: Not hard at all  . Food insecurity:    Worry: Sometimes true    Inability: Sometimes true  . Transportation needs:    Medical: No    Non-medical: No  Tobacco Use  . Smoking status: Never Smoker  .  Smokeless tobacco: Never Used  Substance and Sexual Activity  . Alcohol use: No    Alcohol/week: 0.0 standard drinks  . Drug use: No  . Sexual activity: Yes    Partners: Male    Birth control/protection: None, Surgical  Lifestyle  . Physical activity:    Days per week: 0 days    Minutes per session: 0 min  . Stress: To some extent  Relationships  . Social connections:    Talks on phone: More than three times a week    Gets together: More than three times a week    Attends religious service: More than 4 times per year    Active member of club or organization: Yes    Attends meetings of clubs or organizations: More than 4 times per year    Relationship status: Married  Other Topics Concern  . Not on  file  Social History Narrative  . Not on file    Allergies:  Allergies  Allergen Reactions  . Abilify [Aripiprazole] Hives    Metabolic Disorder Labs: Lab Results  Component Value Date   HGBA1C 6.2 (H) 04/28/2018   Lab Results  Component Value Date   PROLACTIN 84.8 (H) 06/01/2017   PROLACTIN 36.2 (H) 03/26/2016   Lab Results  Component Value Date   CHOL 168 04/28/2018   TRIG 261 (H) 04/28/2018   HDL 53 04/28/2018   CHOLHDL 2.9 09/26/2015   VLDL 51 (H) 04/23/2017   LDLCALC 63 04/28/2018   LDLCALC 51 10/22/2017   Lab Results  Component Value Date   TSH 1.290 04/28/2018   TSH 2.570 06/01/2017    Therapeutic Level Labs: No results found for: LITHIUM Lab Results  Component Value Date   VALPROATE 52 10/22/2017   VALPROATE 43 (L) 06/01/2017   No components found for:  CBMZ  Current Medications: Current Outpatient Medications  Medication Sig Dispense Refill  . albuterol (PROAIR HFA) 108 (90 Base) MCG/ACT inhaler Inhale 2 puffs into the lungs every 6 (six) hours as needed for wheezing or shortness of breath. 8.5 g 6  . amantadine (SYMMETREL) 100 MG capsule Take 1 capsule (100 mg total) by mouth 2 (two) times daily. 60 capsule 6  . atorvastatin (LIPITOR) 20 MG tablet Take 1 tablet (20 mg total) by mouth at bedtime. 90 tablet 1  . divalproex (DEPAKOTE) 250 MG DR tablet Take 1 tablet (250 mg total) by mouth every morning. 90 tablet 0  . divalproex (DEPAKOTE) 500 MG DR tablet Take 1 tablet (500 mg total) by mouth every evening. 90 tablet 0  . doxycycline (VIBRAMYCIN) 50 MG capsule Take 1 capsule (50 mg total) by mouth every morning. 90 capsule 1  . fluticasone (FLOVENT HFA) 110 MCG/ACT inhaler Inhale 1 puff into the lungs 2 (two) times daily. 1 Inhaler 11  . gabapentin (NEURONTIN) 600 MG tablet Take 1 tablet (600 mg total) by mouth 3 (three) times daily. 90 tablet 3  . glucose blood (ACCU-CHEK AVIVA PLUS) test strip 1 each by Other route as needed for other. Use as  instructed 100 each 12  . hydrochlorothiazide (HYDRODIURIL) 12.5 MG tablet TAKE 1 TABLET(12.5 MG) BY MOUTH DAILY 90 tablet 1  . Insulin Pen Needle (BD PEN NEEDLE NANO U/F) 32G X 4 MM MISC 1 each by Does not apply route daily. 100 each 12  . JARDIANCE 25 MG TABS tablet TAKE 1 TABLET BY MOUTH DAILY 90 tablet 1  . LEVEMIR FLEXTOUCH 100 UNIT/ML Pen ADMINISTER 40 UNITS UNDER THE SKIN TWICE  DAILY 3 mL 1  . metFORMIN (GLUCOPHAGE) 500 MG tablet TAKE 2 TABLETS BY MOUTH TWICE DAILY BEFORE A MEAL 360 tablet 0  . NOVOTWIST 32G X 5 MM MISC USE WITH LEVEMIR PEN BID  5  . omeprazole (PRILOSEC) 10 MG capsule TAKE 1 CAPSULE(10 MG) BY MOUTH DAILY 90 capsule 1  . paliperidone (INVEGA SUSTENNA) 234 MG/1.5ML SUSY injection Inject 234 mg into the muscle every 30 (thirty) days. 1 mL 6  . pioglitazone (ACTOS) 45 MG tablet Take 1 tablet (45 mg total) by mouth daily. 90 tablet 1  . Vilazodone HCl (VIIBRYD) 40 MG TABS Take 1 tablet (40 mg total) by mouth every morning. 90 tablet 0   Current Facility-Administered Medications  Medication Dose Route Frequency Provider Last Rate Last Dose  . paliperidone (INVEGA SUSTENNA) injection 234 mg  234 mg Intramuscular Once Brandy Hale, MD         Musculoskeletal: Strength & Muscle Tone: within normal limits Gait & Station: normal Patient leans: N/A  Psychiatric Specialty Exam: Review of Systems  Psychiatric/Behavioral: Positive for depression and hallucinations. The patient is nervous/anxious.   All other systems reviewed and are negative.   There were no vitals taken for this visit.There is no height or weight on file to calculate BMI.  General Appearance: Casual  Eye Contact:  Fair  Speech:  Normal Rate  Volume:  Normal  Mood:  Anxious  Affect:  Congruent  Thought Process:  Goal Directed and Descriptions of Associations: Intact  Orientation:  Full (Time, Place, and Person)  Thought Content: Delusions and Paranoid Ideation , chronic , on and off, able to cope   Suicidal Thoughts:  No  Homicidal Thoughts:  No  Memory:  Immediate;   Fair Recent;   Fair Remote;   Fair  Judgement:  Fair  Insight:  Fair  Psychomotor Activity:  Normal  Concentration:  Concentration: Fair and Attention Span: Fair  Recall:  [employer] of Knowledge: Fair  Language: Fair  Akathisia:  No  Handed:  Right  AIMS (if indicated): 0  Assets:  Communication Skills Desire for Improvement Housing Intimacy Social Support Talents/Skills  ADL's:  Intact  Cognition: WNL  Sleep:  Fair   Screenings: AIMS     Office Visit from 05/12/2017 in [facility] Office Visit from 07/29/2016 in [facility] Office Visit from 05/06/2016 in [facility] Office Visit from 04/01/2016 in [facility] Office Visit from 03/28/2015 in [facility]  AIMS Total Score  0  0  0  0  0    GAD-7     Office Visit from 04/28/2018 in [facility]  Total GAD-7 Score  8    PHQ2-9     Office Visit from 04/28/2018 in [facility] Clinical Support from 01/13/2018 in [facility] Clinical Support from 01/13/2017 in [facility] Office Visit from 09/24/2015 in [facility] Office Visit from 06/20/2015 in [place]  PHQ-2 Total Score  4  2  2  3   0  PHQ-9 Total Score  11  8  12  8   -       Assessment and Plan: Tabitha [name] is a 48 year old Caucasian female who has a history of schizoaffective disorder, anxiety disorder, insomnia, OSA on CPAP who presented to the clinic today for a follow-up visit.     Plan For schizoaffective disorder Invega sustenna 234 mg IM q. 28 days.  She  will receive injection today.    For anxiety symptoms Continue Viibryd 40 mg p.o. daily Continue gabapentin 600 mg p.o. 3 times daily.  She also takes it for neuropathy.  We will discontinue Invega oral medication  Invega  Sustenna 234 mg IM  q4 weeks   Continue Depakote as prescribed  Medications  refilled  Follow up in clinic in 2 weeks or sooner if needed.  More than 50 % of the time was spent for psychoeducation and supportive psychotherapy and care coordination.  This note was generated in part or whole with voice recognition software. Voice recognition is usually quite accurate but there are transcription errors that can and very often do occur. I apologize for any typographical errors that were not detected and corrected.        Brandy HaleUzma Treyden Hakim, MD 06/06/2018, 2:47 PM

## 2018-06-07 NOTE — Telephone Encounter (Signed)
Refilled until next appointment Requested Prescriptions  Pending Prescriptions Disp Refills  . metFORMIN (GLUCOPHAGE) 500 MG tablet [Pharmacy Med Name: METFORMIN 500MG TABLETS] 360 tablet 2    Sig: TAKE 2 TABLETS BY MOUTH TWICE DAILY BEFORE A MEAL     Endocrinology:  Diabetes - Biguanides Passed - 06/06/2018  8:07 PM      Passed - Cr in normal range and within 360 days    Creat  Date Value Ref Range Status  09/26/2015 0.73 0.50 - 1.10 mg/dL Final   Creatinine, Ser  Date Value Ref Range Status  04/28/2018 0.79 0.57 - 1.00 mg/dL Final         Passed - HBA1C is between 0 and 7.9 and within 180 days    HB A1C (BAYER DCA - WAIVED)  Date Value Ref Range Status  10/22/2017 6.9 <7.0 % Final    Comment:                                          Diabetic Adult            <7.0                                       Healthy Adult        4.3 - 5.7                                                           (DCCT/NGSP) American Diabetes Association's Summary of Glycemic Recommendations for Adults with Diabetes: Hemoglobin A1c <7.0%. More stringent glycemic goals (A1c <6.0%) may further reduce complications at the cost of increased risk of hypoglycemia.    Hgb A1c MFr Bld  Date Value Ref Range Status  04/28/2018 6.2 (H) 4.8 - 5.6 % Final    Comment:             Prediabetes: 5.7 - 6.4          Diabetes: >6.4          Glycemic control for adults with diabetes: <7.0          Passed - eGFR in normal range and within 360 days    GFR calc Af Amer  Date Value Ref Range Status  04/28/2018 104 >59 mL/min/1.73 Final   GFR calc non Af Amer  Date Value Ref Range Status  04/28/2018 90 >59 mL/min/1.73 Final         Passed - Valid encounter within last 6 months    Recent Outpatient Visits          1 month ago Controlled type 2 diabetes mellitus with diabetic neuropathy, with long-term current use of insulin [facility])   [place], [place], [place]   7 months ago Essential  hypertension   [place], Megan P, DO   10 months ago Controlled type 2 diabetes mellitus without complication, unspecified whether long term insulin use [facility])   [place], [place], [place]   1 year ago Acute bacterial conjunctivitis of right eye   [facility] [place], [place]   1  year ago Acute bacterial conjunctivitis of right eye   [facility] [place], [place]      Future Appointments            In 7 months [facility], [place]

## 2018-06-20 ENCOUNTER — Ambulatory Visit: Payer: Medicare Other | Admitting: Psychiatry

## 2018-07-04 ENCOUNTER — Other Ambulatory Visit: Payer: Self-pay

## 2018-07-04 ENCOUNTER — Ambulatory Visit (INDEPENDENT_AMBULATORY_CARE_PROVIDER_SITE_OTHER): Payer: Medicare Other

## 2018-07-04 ENCOUNTER — Encounter: Payer: Self-pay | Admitting: Psychiatry

## 2018-07-04 ENCOUNTER — Ambulatory Visit: Payer: Medicare Other | Admitting: Psychiatry

## 2018-07-04 VITALS — BP 140/83 | HR 76 | Temp 97.6°F | Ht 62.0 in | Wt 230.8 lb

## 2018-07-04 DIAGNOSIS — F259 Schizoaffective disorder, unspecified: Secondary | ICD-10-CM | POA: Diagnosis not present

## 2018-07-04 DIAGNOSIS — F3170 Bipolar disorder, currently in remission, most recent episode unspecified: Secondary | ICD-10-CM

## 2018-07-04 DIAGNOSIS — F429 Obsessive-compulsive disorder, unspecified: Secondary | ICD-10-CM | POA: Diagnosis not present

## 2018-07-04 DIAGNOSIS — F251 Schizoaffective disorder, depressive type: Secondary | ICD-10-CM

## 2018-07-04 MED ORDER — DIVALPROEX SODIUM 250 MG PO DR TAB: 250.0000 mg | DELAYED_RELEASE_TABLET | Freq: Every morning | ORAL | 1 refills | Status: DC

## 2018-07-04 MED ORDER — GABAPENTIN 600 MG PO TABS: 600.0000 mg | ORAL_TABLET | Freq: Three times a day (TID) | ORAL | 3 refills | Status: DC

## 2018-07-04 MED ORDER — VILAZODONE HCL 40 MG PO TABS: 40.0000 mg | ORAL_TABLET | Freq: Every morning | ORAL | 1 refills | Status: DC

## 2018-07-04 MED ORDER — AMANTADINE HCL 100 MG PO CAPS: 100.0000 mg | ORAL_CAPSULE | Freq: Two times a day (BID) | ORAL | 6 refills | Status: DC

## 2018-07-04 MED ORDER — PALIPERIDONE PALMITATE ER 156 MG/ML IM SUSY
234.0000 mg | PREFILLED_SYRINGE | Freq: Once | INTRAMUSCULAR | Status: AC
  Administered 2018-07-04: 234 mg via INTRAMUSCULAR

## 2018-07-04 MED ORDER — DIVALPROEX SODIUM 500 MG PO DR TAB: 500.0000 mg | DELAYED_RELEASE_TABLET | Freq: Every evening | ORAL | 1 refills | Status: DC

## 2018-07-04 NOTE — Progress Notes (Signed)
BH MD OP Progress Note  07/04/2018 3:02 PM RHEALYN [name]  MRN:  [id number]  Chief Complaint: Follow-up  Chief Complaint    Follow-up; Medication Refill; Other     HPI: Tabitha [name] is a 48 year old married, Caucasian female with history of schizoaffective disorder presented for follow-up appointment.  She reported that she continues to have paranoia and has obsessive thoughts and continues to think about Jesus.  She reported that she has been spending a lot more time reading Bible as she is worried about the coronavirus.  She reported that she has been reading Bible  as much as she can.  She appears somewhat apprehensive during the interview.  She currently denied having any auditory or visual hallucinations.  She reported that she has been helping her husband with home education of her children.  She reported that she has been compliant with her medications and has been getting monthly injections on a regular basis.  She is not having any side effects of the medications.  We discussed about her medications in detail.  She reported that she wants to continue the same medications at this time.    Discussed about her coping skills in detail and advised patient to start exercising and monitoring her behavior and she agreed with the plan.      She will receive her Invega injection at this time.    She currently denied having any suicidal homicidal ideations or plans.     Husband and her family remains supportive.     Visit Diagnosis:    ICD-10-CM   1. Schizoaffective disorder, unspecified type (HCC) F25.9   2. Obsessive-compulsive disorder, unspecified type F42.9   3. Schizoaffective disorder, depressive type (HCC) F25.1 Vilazodone HCl (VIIBRYD) 40 MG TABS    divalproex (DEPAKOTE) 250 MG DR tablet    divalproex (DEPAKOTE) 500 MG DR tablet    Past Psychiatric History: History of schizoaffective disorder, OCD.  She used to follow up with [facility] in [place] in the past and then with  Dr. Garnetta Buddy.  She denies any suicide attempts.  Denies any inpatient mental health admissions.  Past Medical History:  Past Medical History:  Diagnosis Date  . Allergic rhinitis   . Anxiety   . Asthma   . Bipolar disorder (HCC)   . Depression   . Diabetes mellitus without complication (HCC)   . GERD (gastroesophageal reflux disease)   . Hyperlipidemia   . Mild sleep apnea   . OCD (obsessive compulsive disorder)   . Palpitations   . Paranoia (HCC)   . Schizoaffective disorder (HCC)   . Sleep apnea     Past Surgical History:  Procedure Laterality Date  . CESAREAN SECTION  2006/2008  . CHOLECYSTECTOMY  2006    Family Psychiatric History: Denies.  Family History:  Family History  Problem Relation Age of Onset  . Diabetes Father   . Hyperlipidemia Father   . Diabetes Brother   . Lupus Mother   . Diabetes Mother        pre diabetes   Substance abuse history: Denies   Social History: Married, lives with her husband.  She is on SSD.  Her husband is also disabled.  He is a double amputee, from a past history of abdominal gangrene which spread.  She has 2 children aged 42 and 35 years old.  They are home schooled at this time. Social History   Socioeconomic History  . Marital status: Married    Spouse name: kevin  . Number of  children: 2  . Years of education: Not on file  . Highest education level: Bachelor's degree (e.g., BA, AB, BS)  Occupational History  . Not on file  Social Needs  . Financial resource strain: Not hard at all  . Food insecurity:    Worry: Sometimes true    Inability: Sometimes true  . Transportation needs:    Medical: No    Non-medical: No  Tobacco Use  . Smoking status: Never Smoker  . Smokeless tobacco: Never Used  Substance and Sexual Activity  . Alcohol use: No    Alcohol/week: 0.0 standard drinks  . Drug use: No  . Sexual activity: Yes    Partners: Male    Birth control/protection: None, Surgical  Lifestyle  . Physical activity:     Days per week: 0 days    Minutes per session: 0 min  . Stress: To some extent  Relationships  . Social connections:    Talks on phone: More than three times a week    Gets together: More than three times a week    Attends religious service: More than 4 times per year    Active member of club or organization: Yes    Attends meetings of clubs or organizations: More than 4 times per year    Relationship status: Married  Other Topics Concern  . Not on file  Social History Narrative  . Not on file    Allergies:  Allergies  Allergen Reactions  . Abilify [Aripiprazole] Hives    Metabolic Disorder Labs: Lab Results  Component Value Date   HGBA1C 6.2 (H) 04/28/2018   Lab Results  Component Value Date   PROLACTIN 84.8 (H) 06/01/2017   PROLACTIN 36.2 (H) 03/26/2016   Lab Results  Component Value Date   CHOL 168 04/28/2018   TRIG 261 (H) 04/28/2018   HDL 53 04/28/2018   CHOLHDL 2.9 09/26/2015   VLDL 51 (H) 04/23/2017   LDLCALC 63 04/28/2018   LDLCALC 51 10/22/2017   Lab Results  Component Value Date   TSH 1.290 04/28/2018   TSH 2.570 06/01/2017    Therapeutic Level Labs: No results found for: LITHIUM Lab Results  Component Value Date   VALPROATE 52 10/22/2017   VALPROATE 43 (L) 06/01/2017   No components found for:  CBMZ  Current Medications: Current Outpatient Medications  Medication Sig Dispense Refill  . albuterol (PROAIR HFA) 108 (90 Base) MCG/ACT inhaler Inhale 2 puffs into the lungs every 6 (six) hours as needed for wheezing or shortness of breath. 8.5 g 6  . amantadine (SYMMETREL) 100 MG capsule Take 1 capsule (100 mg total) by mouth 2 (two) times daily. 60 capsule 6  . atorvastatin (LIPITOR) 20 MG tablet Take 1 tablet (20 mg total) by mouth at bedtime. 90 tablet 1  . divalproex (DEPAKOTE) 250 MG DR tablet Take 1 tablet (250 mg total) by mouth every morning. 90 tablet 1  . divalproex (DEPAKOTE) 500 MG DR tablet Take 1 tablet (500 mg total) by mouth  every evening. 90 tablet 1  . doxycycline (VIBRAMYCIN) 50 MG capsule Take 1 capsule (50 mg total) by mouth every morning. 90 capsule 1  . fluticasone (FLOVENT HFA) 110 MCG/ACT inhaler Inhale 1 puff into the lungs 2 (two) times daily. 1 Inhaler 11  . gabapentin (NEURONTIN) 600 MG tablet Take 1 tablet (600 mg total) by mouth 3 (three) times daily. 90 tablet 3  . glucose blood (ACCU-CHEK AVIVA PLUS) test strip 1 each by Other route as  needed for other. Use as instructed 100 each 12  . hydrochlorothiazide (HYDRODIURIL) 12.5 MG tablet TAKE 1 TABLET(12.5 MG) BY MOUTH DAILY 90 tablet 1  . Insulin Pen Needle (BD PEN NEEDLE NANO U/F) 32G X 4 MM MISC 1 each by Does not apply route daily. 100 each 12  . JARDIANCE 25 MG TABS tablet TAKE 1 TABLET BY MOUTH DAILY 90 tablet 1  . LEVEMIR FLEXTOUCH 100 UNIT/ML Pen ADMINISTER 40 UNITS UNDER THE SKIN TWICE DAILY 3 mL 1  . metFORMIN (GLUCOPHAGE) 500 MG tablet TAKE 2 TABLETS BY MOUTH TWICE DAILY BEFORE A MEAL 360 tablet 2  . NOVOTWIST 32G X 5 MM MISC USE WITH LEVEMIR PEN BID  5  . omeprazole (PRILOSEC) 10 MG capsule TAKE 1 CAPSULE(10 MG) BY MOUTH DAILY 90 capsule 1  . paliperidone (INVEGA SUSTENNA) 234 MG/1.5ML SUSY injection Inject 234 mg into the muscle every 30 (thirty) days. 1 mL 6  . pioglitazone (ACTOS) 45 MG tablet Take 1 tablet (45 mg total) by mouth daily. 90 tablet 1  . Vilazodone HCl (VIIBRYD) 40 MG TABS Take 1 tablet (40 mg total) by mouth every morning. 90 tablet 1   No current facility-administered medications for this visit.      Musculoskeletal: Strength & Muscle Tone: within normal limits Gait & Station: normal Patient leans: N/A  Psychiatric Specialty Exam: Review of Systems  Psychiatric/Behavioral: Positive for depression and hallucinations. The patient is nervous/anxious.   All other systems reviewed and are negative.   Blood pressure 140/83, pulse 76, temperature 97.6 F (36.4 C), temperature source Oral, height  (1.575 m), weight  230 lb 12.8 oz (104.7 kg).Body mass index is 42.21 kg/m.  General Appearance: Casual  Eye Contact:  Fair  Speech:  Normal Rate  Volume:  Normal  Mood:  Anxious  Affect:  Congruent  Thought Process:  Goal Directed and Descriptions of Associations: Intact  Orientation:  Full (Time, Place, and Person)  Thought Content: Delusions and Paranoid Ideation , chronic , on and off, able to cope  Suicidal Thoughts:  No  Homicidal Thoughts:  No  Memory:  Immediate;   Fair Recent;   Fair Remote;   Fair  Judgement:  Fair  Insight:  Fair  Psychomotor Activity:  Normal  Concentration:  Concentration: Fair and Attention Span: Fair  Recall:  [employer] of Knowledge: Fair  Language: Fair  Akathisia:  No  Handed:  Right  AIMS (if indicated): 0  Assets:  Communication Skills Desire for Improvement Housing Intimacy Social Support Talents/Skills  ADL's:  Intact  Cognition: WNL  Sleep:  Fair   Screenings: AIMS     Office Visit from 05/12/2017 in [facility] Office Visit from 07/29/2016 in [facility] Office Visit from 05/06/2016 in [facility] Office Visit from 04/01/2016 in [facility] Office Visit from 03/28/2015 in [facility]  AIMS Total Score  0  0  0  0  0    GAD-7     Office Visit from 04/28/2018 in [facility]  Total GAD-7 Score  8    PHQ2-9     Office Visit from 04/28/2018 in [facility] Clinical Support from 01/13/2018 in [facility] Clinical Support from 01/13/2017 in [facility] Office Visit from 09/24/2015 in [facility] Office Visit from 06/20/2015 in [place]  PHQ-2 Total Score  0  PHQ-9 Total Score  11  8  12  8   -       Assessment and Plan: Lavell is a 48 year old Caucasian female who has a history of schizoaffective disorder, anxiety disorder,  insomnia, OSA on CPAP who presented to the clinic today for a follow-up visit.     Plan For schizoaffective disorder Invega sustenna 234 mg IM q. 28 days.  She will receive injection today.    For anxiety symptoms Continue Viibryd 40 mg p.o. daily Continue gabapentin 600 mg p.o. 3 times daily.  She also takes it for neuropathy.  We will discontinue Invega oral medication  Invega Sustenna 234 mg IM  q4 weeks   Continue Depakote as prescribed  Medications  Refilled  Advised  patient about behavior modification include exercise cooking and meditation and she agreed with the plan.  Follow up in clinic in 4 weeks or sooner if needed.  More than 50 % of the time was spent for psychoeducation and supportive psychotherapy and care coordination.  This note was generated in part or whole with voice recognition software. Voice recognition is usually quite accurate but there are transcription errors that can and very often do occur. I apologize for any typographical errors that were not detected and corrected.        Brandy Hale, MD 07/04/2018, 3:02 PM

## 2018-07-04 NOTE — Patient Instructions (Addendum)
Met with patient after her scheduled evaluation with Dr. Gretel Acre to arrange to give patient her due Invega Sustenna 234 mg/1.5 ml IM monthly injection.  [name] prepared as ordered and given to patient in her right upper outer gluteal area.  Patient tolerated due injection without any complaint of pain or discomfort and agreed to return in 30 days for next due injection.  Exp. Date 12/2019.  ndc [id number] lot # [id number]  s/n# [id number].

## 2018-07-28 ENCOUNTER — Other Ambulatory Visit: Payer: Self-pay | Admitting: Family Medicine

## 2018-07-28 DIAGNOSIS — I10 Essential (primary) hypertension: Secondary | ICD-10-CM

## 2018-07-29 NOTE — Telephone Encounter (Signed)
Requested Prescriptions  Pending Prescriptions Disp Refills  . atorvastatin (LIPITOR) 20 MG tablet [Pharmacy Med Name: ATORVASTATIN 20MG  TABLETS] 90 tablet 1    Sig: TAKE 1 TABLET(20 MG) BY MOUTH AT BEDTIME     Cardiovascular:  Antilipid - Statins Failed - 07/28/2018  6:58 PM      Failed - Triglycerides in normal range and within 360 days    Triglycerides  Date Value Ref Range Status  04/28/2018 261 (H) 0 - 149 mg/dL Final   Triglycerides Piccolo,Waived  Date Value Ref Range Status  04/23/2017 253 (H) <150 mg/dL Final    Comment:                            Normal                   <150                         Borderline High     150 - 199                         High                200 - 499                         Very High                >499          Passed - Total Cholesterol in normal range and within 360 days    Cholesterol, Total  Date Value Ref Range Status  04/28/2018 168 100 - 199 mg/dL Final   Cholesterol Piccolo, Waived  Date Value Ref Range Status  04/23/2017 176 <200 mg/dL Final    Comment:                            Desirable                <200                         Borderline High      200- 239                         High                     >239          Passed - LDL in normal range and within 360 days    LDL Calculated  Date Value Ref Range Status  04/28/2018 63 0 - 99 mg/dL Final         Passed - HDL in normal range and within 360 days    HDL  Date Value Ref Range Status  04/28/2018 53 >39 mg/dL Final         Passed - Patient is not pregnant      Passed - Valid encounter within last 12 months    Recent Outpatient Visits          3 months ago Controlled type 2 diabetes mellitus with diabetic neuropathy, with long-term current use of insulin [facility])   [facility] [place], [place], [place]   9 months ago Essential  hypertension   [facility] [place], Megan P, [place]   1 year ago Controlled type 2 diabetes mellitus  without complication, unspecified whether long term insulin use [facility])   [facility] [place], [place], [place]   1 year ago Acute bacterial conjunctivitis of right eye   [facility] [place], [place]   1 year ago Acute bacterial conjunctivitis of right eye   [facility] [place], [place]      Future Appointments            In 5 months [facility]

## 2018-08-01 ENCOUNTER — Ambulatory Visit (INDEPENDENT_AMBULATORY_CARE_PROVIDER_SITE_OTHER): Payer: Medicare Other | Admitting: Psychiatry

## 2018-08-01 ENCOUNTER — Encounter: Payer: Self-pay | Admitting: Psychiatry

## 2018-08-01 ENCOUNTER — Ambulatory Visit (INDEPENDENT_AMBULATORY_CARE_PROVIDER_SITE_OTHER): Payer: Medicare Other

## 2018-08-01 ENCOUNTER — Other Ambulatory Visit: Payer: Self-pay | Admitting: Family Medicine

## 2018-08-01 ENCOUNTER — Other Ambulatory Visit: Payer: Self-pay

## 2018-08-01 VITALS — BP 141/82 | HR 73 | Temp 97.9°F | Ht 62.0 in | Wt 227.6 lb

## 2018-08-01 DIAGNOSIS — F429 Obsessive-compulsive disorder, unspecified: Secondary | ICD-10-CM

## 2018-08-01 DIAGNOSIS — E119 Type 2 diabetes mellitus without complications: Secondary | ICD-10-CM

## 2018-08-01 DIAGNOSIS — E0800 Diabetes mellitus due to underlying condition with hyperosmolarity without nonketotic hyperglycemic-hyperosmolar coma (NKHHC): Secondary | ICD-10-CM

## 2018-08-01 DIAGNOSIS — F259 Schizoaffective disorder, unspecified: Secondary | ICD-10-CM

## 2018-08-01 DIAGNOSIS — F3170 Bipolar disorder, currently in remission, most recent episode unspecified: Secondary | ICD-10-CM | POA: Diagnosis not present

## 2018-08-01 DIAGNOSIS — Z794 Long term (current) use of insulin: Secondary | ICD-10-CM

## 2018-08-01 MED ORDER — CLONAZEPAM 0.5 MG PO TABS: 0.2500 mg | ORAL_TABLET | Freq: Two times a day (BID) | ORAL | 1 refills | Status: DC | PRN

## 2018-08-01 MED ORDER — PALIPERIDONE PALMITATE ER 156 MG/ML IM SUSY
234.0000 mg | PREFILLED_SYRINGE | Freq: Once | INTRAMUSCULAR | Status: AC
  Administered 2018-08-01: 234 mg via INTRAMUSCULAR

## 2018-08-01 NOTE — Progress Notes (Cosign Needed)
Met with patient after her scheduled evaluation with Dr. Gretel Acre to arrange to give patient her due Invega Sustenna 234 mg/1.5 ml IM monthly injection.  [name] prepared as ordered and given to patient in her left upper outer gluteal area.  Patient tolerated due injection without any complaint of pain or discomfort and agreed to return in 30 days for next due injection.  Exp. Date 01/2020.  ndc [id number] lot # [id number]  s/n# [id number]

## 2018-08-01 NOTE — Progress Notes (Signed)
Patient ID: Tabitha [name], female   DOB: Aug 13, 1970, 48 y.o.   MRN: [id number]    Patient is a 48 year old female with history of schizoaffective disorder. She reported that she has noticed increase in her obsessive-compulsive thoughts. She stated that she feels anxious and apprehensive due to the recent pandemic. Her husband also had a small surgery. She is taking care of her family and has been compliant with her medications. She is due for her injection today. She is going to the office to receive her monthly injection. She denied having any side effects of the medications. She is able to contract for safety. No adverse effects of the medications noted at this time.  Plan.  Patient has enough supply of the medications. I will start her on Klonopin 0.25 mg PO BID PRN for anxiety. Discussed with her about the side effects in detail and she agreed to the plan.  I will call her in one week to follow up and she agreed.   I have discussed the assessment and treatment plan with the patient. The patient was provided an opportunity to ask questions and all were answered. The patient agreed with the plan and demonstrated an understanding of the instructions.   The patient was advised to call back or seek an in-person evaluation if the symptoms worsen or if the condition fails to improve as anticipated.   I provided 10 minutes of non-face-to-face time during this encounter.

## 2018-08-01 NOTE — Progress Notes (Cosign Needed)
Tc on  08-01-18 @ 1:33 pt medical and surgical hx was reviewed with no changes. pt allergies were reviewed with no changes. Pt medications and pharmacy were reviewed and no changes. Vital were not done due to this was a phone consult.

## 2018-08-08 ENCOUNTER — Other Ambulatory Visit: Payer: Self-pay

## 2018-08-08 ENCOUNTER — Ambulatory Visit (INDEPENDENT_AMBULATORY_CARE_PROVIDER_SITE_OTHER): Payer: Medicare Other | Admitting: Psychiatry

## 2018-08-08 ENCOUNTER — Encounter: Payer: Self-pay | Admitting: Psychiatry

## 2018-08-08 DIAGNOSIS — F429 Obsessive-compulsive disorder, unspecified: Secondary | ICD-10-CM | POA: Diagnosis not present

## 2018-08-08 DIAGNOSIS — F259 Schizoaffective disorder, unspecified: Secondary | ICD-10-CM

## 2018-08-08 NOTE — Progress Notes (Signed)
Patient ID: Tabitha [name], female   DOB: [birth year], 48 y.o.   MRN: [id number]   Patient is a 48 year old female with history of schizoaffective disorder and OCD followed up for medication and symptoms. She reported that she received her [place] last week.she was also started on Klonopin PRN for her anxiety. She reported that she is feeling better as she is taking half a pill of Klonopin on a daily basis. She is concerned about her husband who has been diagnosed with precancerous condition and will have her surgery next week. She is supportive of him. Patient reported that she has been compliant with her medications. She does not have any other paranoia or acute symptoms at this time. She denied having any suicidal or homicidal ideations or plans.  Plan:  Continue medications as prescribed.  Follow-up in three weeks for monthly injection. Patient has enough supply of medications.  I have discussed the assessment and treatment plan with the patient. The patient was provided an opportunity to ask questions and all were answered. The patient agreed with the plan and demonstrated an understanding of the instructions.   The patient was advised to call back or seek an in-person evaluation if the symptoms worsen or if the condition fails to improve as anticipated.   I provided 10 minutes of non-face-to-face time during this encounter.

## 2018-08-29 ENCOUNTER — Ambulatory Visit (INDEPENDENT_AMBULATORY_CARE_PROVIDER_SITE_OTHER): Payer: Medicare Other | Admitting: Psychiatry

## 2018-08-29 ENCOUNTER — Encounter: Payer: Self-pay | Admitting: Psychiatry

## 2018-08-29 ENCOUNTER — Ambulatory Visit (INDEPENDENT_AMBULATORY_CARE_PROVIDER_SITE_OTHER): Payer: Medicare Other

## 2018-08-29 ENCOUNTER — Other Ambulatory Visit: Payer: Self-pay

## 2018-08-29 VITALS — BP 141/79 | HR 80 | Temp 97.5°F | Ht 62.0 in | Wt 231.0 lb

## 2018-08-29 DIAGNOSIS — F319 Bipolar disorder, unspecified: Secondary | ICD-10-CM

## 2018-08-29 DIAGNOSIS — F259 Schizoaffective disorder, unspecified: Secondary | ICD-10-CM

## 2018-08-29 DIAGNOSIS — F3130 Bipolar disorder, current episode depressed, mild or moderate severity, unspecified: Secondary | ICD-10-CM

## 2018-08-29 DIAGNOSIS — F3170 Bipolar disorder, currently in remission, most recent episode unspecified: Secondary | ICD-10-CM

## 2018-08-29 DIAGNOSIS — F429 Obsessive-compulsive disorder, unspecified: Secondary | ICD-10-CM

## 2018-08-29 DIAGNOSIS — F251 Schizoaffective disorder, depressive type: Secondary | ICD-10-CM

## 2018-08-29 DIAGNOSIS — F250 Schizoaffective disorder, bipolar type: Secondary | ICD-10-CM

## 2018-08-29 DIAGNOSIS — F422 Mixed obsessional thoughts and acts: Secondary | ICD-10-CM

## 2018-08-29 DIAGNOSIS — F428 Other obsessive-compulsive disorder: Secondary | ICD-10-CM

## 2018-08-29 MED ORDER — PALIPERIDONE PALMITATE ER 156 MG/ML IM SUSY
234.0000 mg | PREFILLED_SYRINGE | Freq: Once | INTRAMUSCULAR | Status: AC
  Administered 2018-08-29: 234 mg via INTRAMUSCULAR

## 2018-08-29 MED ORDER — PALIPERIDONE PALMITATE ER 234 MG/1.5ML IM SUSY: 234.0000 mg | PREFILLED_SYRINGE | INTRAMUSCULAR | 6 refills | Status: DC

## 2018-08-29 NOTE — Progress Notes (Signed)
Patient ID: Tabitha [name], female   DOB: October 31, 1970, 48 y.o.   MRN: [id number]    Patient is a 48 year old female with history of schizoaffective disorder and OCD who was followed up for her medications. She reported that she was becoming paranoid last week as she thought that her husband might be leaving her. However she spoke to her husband and her sister who spoke to her. She reported that she has been doing well on her current medications. She does not have any acute symptoms at this time. She denied having any suicidal or homicidal ideations or plans. She's able to contract for safety. She reported that she will be going to the office today to get her injection of [place]. She does not have any adverse effects of the medications. She is compliant with her medications.  Plan. Patient will continue with her medications. Follow up in one month earlier depending on her symptoms.  I discussed the assessment and treatment plan with the patient. The patient was provided an opportunity to ask questions and all were answered. The patient agreed with the plan and demonstrated an understanding of the instructions.   The patient was advised to call back or seek an in-person evaluation if the symptoms worsen or if the condition fails to improve as anticipated.   I provided 15 minutes of -face-to-face time during this encounter

## 2018-08-29 NOTE — Progress Notes (Signed)
Invega Sustenna 234 mg/1.5 ml IM monthly injection.  Gean Birchwood prepared as ordered and given to patient in her right upper outer gluteal area.  Patient tolerated due injection without any complaint of pain or discomfort and agreed to return in 30 days for next due injection.  Exp. Date 01/2020.  ndc [id number] lot # [id number]  s/n# [id number]

## 2018-09-05 ENCOUNTER — Other Ambulatory Visit: Payer: Self-pay | Admitting: Family Medicine

## 2018-09-26 ENCOUNTER — Ambulatory Visit (INDEPENDENT_AMBULATORY_CARE_PROVIDER_SITE_OTHER): Payer: Medicare Other

## 2018-09-26 ENCOUNTER — Other Ambulatory Visit: Payer: Self-pay

## 2018-09-26 VITALS — BP 149/96 | HR 91 | Temp 98.1°F | Ht 62.0 in | Wt 232.6 lb

## 2018-09-26 DIAGNOSIS — F251 Schizoaffective disorder, depressive type: Secondary | ICD-10-CM

## 2018-09-26 DIAGNOSIS — F429 Obsessive-compulsive disorder, unspecified: Secondary | ICD-10-CM

## 2018-09-26 MED ORDER — PALIPERIDONE PALMITATE ER 234 MG/1.5ML IM SUSY
234.0000 mg | PREFILLED_SYRINGE | Freq: Once | INTRAMUSCULAR | Status: AC
  Administered 2018-09-26: 234 mg via INTRAMUSCULAR

## 2018-09-26 NOTE — Progress Notes (Signed)
Invega Sustenna 234 mg/1.5 ml IM monthly injection.  Gean Birchwood prepared as ordered and given to patient in her left  upper outer gluteal area.  Patient tolerated due injection without any complaint of pain or discomfort and agreed to return in 30 days for next due injection.  Exp. Date 01/2020.  ndc [id number] lot # [id number]  s/n# [id number]

## 2018-10-03 ENCOUNTER — Encounter: Payer: Self-pay | Admitting: Psychiatry

## 2018-10-03 ENCOUNTER — Ambulatory Visit (INDEPENDENT_AMBULATORY_CARE_PROVIDER_SITE_OTHER): Payer: Medicare Other | Admitting: Psychiatry

## 2018-10-03 ENCOUNTER — Other Ambulatory Visit: Payer: Self-pay | Admitting: Family Medicine

## 2018-10-03 ENCOUNTER — Other Ambulatory Visit: Payer: Self-pay

## 2018-10-03 DIAGNOSIS — F251 Schizoaffective disorder, depressive type: Secondary | ICD-10-CM

## 2018-10-03 DIAGNOSIS — E0800 Diabetes mellitus due to underlying condition with hyperosmolarity without nonketotic hyperglycemic-hyperosmolar coma (NKHHC): Secondary | ICD-10-CM

## 2018-10-03 DIAGNOSIS — F429 Obsessive-compulsive disorder, unspecified: Secondary | ICD-10-CM

## 2018-10-03 DIAGNOSIS — E119 Type 2 diabetes mellitus without complications: Secondary | ICD-10-CM

## 2018-10-03 MED ORDER — GABAPENTIN 600 MG PO TABS: 600.0000 mg | ORAL_TABLET | Freq: Three times a day (TID) | ORAL | 3 refills | Status: DC

## 2018-10-03 MED ORDER — DIVALPROEX SODIUM 500 MG PO DR TAB: 500.0000 mg | DELAYED_RELEASE_TABLET | Freq: Every evening | ORAL | 1 refills | Status: DC

## 2018-10-04 ENCOUNTER — Telehealth: Payer: Self-pay

## 2018-10-04 DIAGNOSIS — E0800 Diabetes mellitus due to underlying condition with hyperosmolarity without nonketotic hyperglycemic-hyperosmolar coma (NKHHC): Secondary | ICD-10-CM

## 2018-10-04 DIAGNOSIS — E119 Type 2 diabetes mellitus without complications: Secondary | ICD-10-CM

## 2018-10-04 MED ORDER — INSULIN DETEMIR 100 UNIT/ML FLEXPEN: 40.0000 [IU] | PEN_INJECTOR | Freq: Two times a day (BID) | SUBCUTANEOUS | 3 refills | Status: DC

## 2018-10-04 NOTE — Telephone Encounter (Signed)
Rx quantity increased

## 2018-10-04 NOTE — Progress Notes (Signed)
Patient ID: Tabitha [name], female   DOB: 10/31/1970, 48 y.o.   MRN: [id number]  Patient is a 48 year old female with history of schizoaffective disorder who was followed up for her medications. She reported that she continues to have auditory hallucinations. She received her [name] last Monday. She reported that the medications are helping her and auditory hallucinations are chronic. She stated that she has stopped taking the Klonopin which is causing worsening of her anxiety and she is going to start the medication again. She is compliant with her medications. She sleeps well at night. She does not have any safety concerns at this time. She has been going to [employer] on a regular basis. She denied having any suicidal or homicidal ideations or plans at this time. She will get an injection in next three weeks.  Plan Continue medications as prescribed. Follow-up in three weeks earlier depending on her symptoms. I discussed the assessment and treatment plan with the patient. The patient was provided an opportunity to ask questions and all were answered. The patient agreed with the plan and demonstrated an understanding of the instructions.   The patient was advised to call back or seek an in-person evaluation if the symptoms worsen or if the condition fails to improve as anticipated.   I provided 15 minutes of non-face-to-face time during this encounter.

## 2018-10-04 NOTE — Telephone Encounter (Signed)
Pharmacy sent a fax regarding the patient's Levemir RX. It states "did doctor mean to only write for 1 pen? (3 day supply) Please advise and change rx if needed.

## 2018-10-24 ENCOUNTER — Ambulatory Visit (INDEPENDENT_AMBULATORY_CARE_PROVIDER_SITE_OTHER): Payer: Medicare Other | Admitting: Psychiatry

## 2018-10-24 ENCOUNTER — Ambulatory Visit (INDEPENDENT_AMBULATORY_CARE_PROVIDER_SITE_OTHER): Payer: Medicare Other

## 2018-10-24 ENCOUNTER — Other Ambulatory Visit: Payer: Self-pay

## 2018-10-24 ENCOUNTER — Encounter: Payer: Self-pay | Admitting: Psychiatry

## 2018-10-24 ENCOUNTER — Other Ambulatory Visit: Payer: Self-pay | Admitting: Psychiatry

## 2018-10-24 VITALS — BP 165/84 | HR 74 | Temp 98.8°F | Ht 62.0 in | Wt 232.4 lb

## 2018-10-24 DIAGNOSIS — F251 Schizoaffective disorder, depressive type: Secondary | ICD-10-CM | POA: Diagnosis not present

## 2018-10-24 DIAGNOSIS — F429 Obsessive-compulsive disorder, unspecified: Secondary | ICD-10-CM | POA: Diagnosis not present

## 2018-10-24 DIAGNOSIS — F250 Schizoaffective disorder, bipolar type: Secondary | ICD-10-CM | POA: Diagnosis not present

## 2018-10-24 MED ORDER — PALIPERIDONE PALMITATE ER 156 MG/ML IM SUSY
234.0000 mg | PREFILLED_SYRINGE | Freq: Once | INTRAMUSCULAR | Status: AC
  Administered 2018-10-24: 234 mg via INTRAMUSCULAR

## 2018-10-24 MED ORDER — PALIPERIDONE PALMITATE ER 234 MG/1.5ML IM SUSY
234.0000 mg | PREFILLED_SYRINGE | Freq: Once | INTRAMUSCULAR | Status: AC
  Administered 2018-11-22: 234 mg via INTRAMUSCULAR

## 2018-10-24 NOTE — Progress Notes (Signed)
Med refilled.

## 2018-10-24 NOTE — Progress Notes (Signed)
Invega Sustenna 234 mg/1.5 ml IM monthly injection. Tabitha [name] prepared as ordered and given to patient in her right upper outer gluteal area. Patient tolerated due injection without any complaint of pain or discomfort and agreed to return in 30 days for next due injection. Exp. Date 03-2020 ndc [id number] lot # [id number] s/n# [id number]

## 2018-10-24 NOTE — Progress Notes (Signed)
Patient ID: Tabitha [name], female   DOB: 1971/01/17, 48 y.o.   MRN: [id number]  Patient is a 48 year old female with history of schizoaffective disorder and OCD who was followed up for medication. She reported that she was having auditory hallucinations and delusional thoughts which are chronic that somebody has been looking at her when she was in the store. Her sister-in-law and her children were present there and she discuss with them and she felt safe. She reported that she has been compliant with her medications. She has insight into her delusional thinking. She reported that she discusses with her children and they allow her to with discussed her feelings and she has insight into her delusional thoughts. She reported that she feels safe with her family. She does not have any suicidal or homicidal ideations. She will receive her [place] injection today. She stated that the medication has been helpful. She takes Klonopin on a PRN basis.   She sleeps well at night. Her family safe and nobody hasThe Covid.  Plan Patient will continue with her injection and her medications as prescribed. She will follow up in one month earlier depending on her symptoms  I discussed the assessment and treatment plan with the patient. The patient was provided an opportunity to ask questions and all were answered. The patient agreed with the plan and demonstrated an understanding of the instructions.   The patient was advised to call back or seek an in-person evaluation if the symptoms worsen or if the condition fails to improve as anticipated.   I provided 15 minutes of non-face-to-face time during this encounter.

## 2018-10-31 ENCOUNTER — Other Ambulatory Visit: Payer: Self-pay | Admitting: Family Medicine

## 2018-10-31 NOTE — Telephone Encounter (Signed)
Requested medication (s) are due for refill today: HCTZ and Jardiance  Requested medication (s) are on the active medication list: yes  Last refill:  08/01/2018  Future visit scheduled: no  Notes to clinic: no valid encounter in  Last 6 months    Requested Prescriptions  Pending Prescriptions Disp Refills   hydrochlorothiazide (HYDRODIURIL) 12.5 MG tablet [Pharmacy Med Name: HYDROCHLOROTHIAZIDE 12.5MG TABLETS] 90 tablet 1    Sig: TAKE 1 TABLET(12.5 MG) BY MOUTH DAILY     Cardiovascular: Diuretics - Thiazide Failed - 10/31/2018 10:01 AM      Failed - Last BP in normal range    BP Readings from Last 1 Encounters:  04/28/18 115/77         Failed - Valid encounter within last 6 months    Recent Outpatient Visits          6 months ago Controlled type 2 diabetes mellitus with diabetic neuropathy, with long-term current use of insulin ([place])   [place], Lilia Argue, [place]   1 year ago Essential hypertension   [place], Shiloh P, DO   1 year ago Controlled type 2 diabetes mellitus without complication, unspecified whether long term insulin use ([place])   [place], [place], [place]   1 year ago Acute bacterial conjunctivitis of right eye   [facility], [place]   1 year ago Acute bacterial conjunctivitis of right eye   [facility] [place], [place]      Future Appointments            In 2 months [place], [place] in normal range and within 360 days    Calcium  Date Value Ref Range Status  04/28/2018 10.1 8.7 - 10.2 mg/dL Final         Passed - Cr in normal range and within 360 days    Creat  Date Value Ref Range Status  09/26/2015 0.73 0.50 - 1.10 mg/dL Final   Creatinine, Ser  Date Value Ref Range Status  04/28/2018 0.79 0.57 - 1.00 mg/dL Final         Passed - K in normal range and within 360 days   Potassium  Date Value Ref Range Status  04/28/2018 4.0 3.5 - 5.2 mmol/L Final         Passed - Na in normal range and within 360 days    Sodium  Date Value Ref Range Status  04/28/2018 139 134 - 144 mmol/L Final          JARDIANCE 25 MG TABS tablet [Pharmacy Med Name: JARDIANCE 25MG TABLETS] 90 tablet 1    Sig: TAKE 1 TABLET BY MOUTH DAILY     Endocrinology:  Diabetes - SGLT2 Inhibitors Failed - 10/31/2018 10:01 AM      Failed - HBA1C is between 0 and 7.9 and within 180 days    HB A1C (BAYER DCA - WAIVED)  Date Value Ref Range Status  10/22/2017 6.9 <7.0 % Final    Comment:                                          Diabetic Adult            <7.0  Healthy Adult        4.3 - 5.7                                                           (DCCT/NGSP) American Diabetes Association's Summary of Glycemic Recommendations for Adults with Diabetes: Hemoglobin A1c <7.0%. More stringent glycemic goals (A1c <6.0%) may further reduce complications at the cost of increased risk of hypoglycemia.    Hgb A1c MFr Bld  Date Value Ref Range Status  04/28/2018 6.2 (H) 4.8 - 5.6 % Final    Comment:             Prediabetes: 5.7 - 6.4          Diabetes: >6.4          Glycemic control for adults with diabetes: <7.0          Failed - Valid encounter within last 6 months    Recent Outpatient Visits          6 months ago Controlled type 2 diabetes mellitus with diabetic neuropathy, with long-term current use of insulin (HCC)   [facility], Rachel Elizabeth, PA-C   1 year ago Essential hypertension   [facility] [place], Megan P, DO   1 year ago Controlled type 2 diabetes mellitus without complication, unspecified whether long term insulin use (HCC)   [facility], Rachel Elizabeth, PA-C   1 year ago Acute bacterial conjunctivitis of right eye   [facility], Rachel Elizabeth, PA-C   1 year ago  Acute bacterial conjunctivitis of right eye   [facility], Rachel Elizabeth, PA-C      Future Appointments            In 2 months [facility]            Passed - Cr in normal range and within 360 days    Creat  Date Value Ref Range Status  09/26/2015 0.73 0.50 - 1.10 mg/dL Final   Creatinine, Ser  Date Value Ref Range Status  04/28/2018 0.79 0.57 - 1.00 mg/dL Final         Passed - LDL in normal range and within 360 days    LDL Calculated  Date Value Ref Range Status  04/28/2018 63 0 - 99 mg/dL Final         Passed - eGFR in normal range and within 360 days    GFR calc Af Amer  Date Value Ref Range Status  04/28/2018 104 >59 mL/min/1.73 Final   GFR calc non Af Amer  Date Value Ref Range Status  04/28/2018 90 >59 mL/min/1.73 Final          

## 2018-11-01 ENCOUNTER — Other Ambulatory Visit: Payer: Self-pay | Admitting: Family Medicine

## 2018-11-01 NOTE — Telephone Encounter (Signed)
Please advise, pt has a pending appt with nurse advisor on 01/16/19. Would you like pt to be seen sooner in order to obtain #[phone] request

## 2018-11-02 NOTE — Telephone Encounter (Signed)
Pt scheduled  

## 2018-11-02 NOTE — Telephone Encounter (Signed)
Patient is due for 6 month f/u this month - I will change rx to 90 day but please get her scheduled

## 2018-11-12 ENCOUNTER — Other Ambulatory Visit: Payer: Self-pay | Admitting: Family Medicine

## 2018-11-13 NOTE — Telephone Encounter (Signed)
Requested Prescriptions  Pending Prescriptions Disp Refills  . omeprazole (PRILOSEC) 10 MG capsule [Pharmacy Med Name: OMEPRAZOLE 10MG  CAPSULES] 90 capsule 1    Sig: TAKE 1 CAPSULE(10 MG) BY MOUTH DAILY     Gastroenterology: Proton Pump Inhibitors Passed - 11/12/2018 12:34 PM      Passed - Valid encounter within last 12 months    Recent Outpatient Visits          6 months ago Controlled type 2 diabetes mellitus with diabetic neuropathy, with long-term current use of insulin [facility])   [place], Lilia Argue, [place]   1 year ago Essential hypertension   [place], Oliver P, DO   1 year ago Controlled type 2 diabetes mellitus without complication, unspecified whether long term insulin use [facility])   [place], [place], [place]   1 year ago Acute bacterial conjunctivitis of right eye   [facility] [facility], [place]   1 year ago Acute bacterial conjunctivitis of right eye   [facility], [place]      Future Appointments            In 2 days [place] Desanctis, Lilia Argue, PA-C [facility], [place]   In 2 months  [employer], [place]

## 2018-11-14 ENCOUNTER — Ambulatory Visit (INDEPENDENT_AMBULATORY_CARE_PROVIDER_SITE_OTHER): Payer: Medicare Other | Admitting: Psychiatry

## 2018-11-14 ENCOUNTER — Other Ambulatory Visit: Payer: Self-pay

## 2018-11-14 ENCOUNTER — Encounter: Payer: Self-pay | Admitting: Psychiatry

## 2018-11-14 DIAGNOSIS — F251 Schizoaffective disorder, depressive type: Secondary | ICD-10-CM | POA: Diagnosis not present

## 2018-11-14 DIAGNOSIS — F429 Obsessive-compulsive disorder, unspecified: Secondary | ICD-10-CM

## 2018-11-14 NOTE — Progress Notes (Signed)
Patient ID: Tabitha [name], female   DOB: [birth year], 48 y.o.   MRN: [id number]   Patient is a 48 year old female with history of schizoaffective disorder who was followed up for medication management. She reported that she has been doing well and has been taking her medications as prescribed. She reported that she has been taking Klonopin regularly which has been helping her with her symptoms and anxiety is improving. She reported that she does not have any side effects of the monthly injection. She is compliant with the medications. She reported that the Klonopin helps with her symptoms. She currently denied having suicidal or homicidal ideation or plan. Her family remained supportive. She is able to contract for safety at this time.  Plan Continue her medications as prescribed. She was at her injection next week. Follow-up in one month earlier depending on her symptoms.   I connected with patient via telemedicine application and verified that I am speaking with the correct person using two identifiers.  I discussed the limitations of evaluation and management by telemedicine and the availability of in person appointments. The patient expressed understanding and agreed to proceed.   I discussed the assessment and treatment plan with the patient. The patient was provided an opportunity to ask questions and all were answered. The patient agreed with the plan and demonstrated an understanding of the instructions.   The patient was advised to call back or seek an in-person evaluation if the symptoms worsen or if the condition fails to improve as anticipated.   I provided 15 minutes of non-face-to-face time during this encounter.

## 2018-11-15 ENCOUNTER — Other Ambulatory Visit: Payer: Self-pay

## 2018-11-15 ENCOUNTER — Ambulatory Visit (INDEPENDENT_AMBULATORY_CARE_PROVIDER_SITE_OTHER): Payer: Medicare Other | Admitting: Family Medicine

## 2018-11-15 ENCOUNTER — Encounter: Payer: Self-pay | Admitting: Family Medicine

## 2018-11-15 VITALS — BP 126/79 | HR 90 | Temp 97.4°F | Ht 62.0 in | Wt 228.0 lb

## 2018-11-15 DIAGNOSIS — K219 Gastro-esophageal reflux disease without esophagitis: Secondary | ICD-10-CM

## 2018-11-15 DIAGNOSIS — E1159 Type 2 diabetes mellitus with other circulatory complications: Secondary | ICD-10-CM

## 2018-11-15 DIAGNOSIS — E1140 Type 2 diabetes mellitus with diabetic neuropathy, unspecified: Secondary | ICD-10-CM | POA: Diagnosis not present

## 2018-11-15 DIAGNOSIS — E119 Type 2 diabetes mellitus without complications: Secondary | ICD-10-CM

## 2018-11-15 DIAGNOSIS — I10 Essential (primary) hypertension: Secondary | ICD-10-CM | POA: Diagnosis not present

## 2018-11-15 DIAGNOSIS — F3170 Bipolar disorder, currently in remission, most recent episode unspecified: Secondary | ICD-10-CM

## 2018-11-15 DIAGNOSIS — E7800 Pure hypercholesterolemia, unspecified: Secondary | ICD-10-CM

## 2018-11-15 DIAGNOSIS — Z794 Long term (current) use of insulin: Secondary | ICD-10-CM

## 2018-11-15 DIAGNOSIS — I152 Hypertension secondary to endocrine disorders: Secondary | ICD-10-CM

## 2018-11-15 DIAGNOSIS — F259 Schizoaffective disorder, unspecified: Secondary | ICD-10-CM

## 2018-11-15 DIAGNOSIS — Z6841 Body Mass Index (BMI) 40.0 and over, adult: Secondary | ICD-10-CM

## 2018-11-15 MED ORDER — PIOGLITAZONE HCL 45 MG PO TABS: 45.0000 mg | ORAL_TABLET | Freq: Every day | ORAL | 1 refills | Status: DC

## 2018-11-15 MED ORDER — METFORMIN HCL 500 MG PO TABS: 1000.0000 mg | ORAL_TABLET | Freq: Two times a day (BID) | ORAL | 1 refills | Status: DC

## 2018-11-15 MED ORDER — JARDIANCE 25 MG PO TABS: 25.0000 mg | ORAL_TABLET | Freq: Every day | ORAL | 1 refills | Status: DC

## 2018-11-15 MED ORDER — FLOVENT HFA 110 MCG/ACT IN AERO: 1.0000 | INHALATION_SPRAY | Freq: Two times a day (BID) | RESPIRATORY_TRACT | 11 refills | Status: DC

## 2018-11-15 MED ORDER — ATORVASTATIN CALCIUM 20 MG PO TABS: ORAL_TABLET | ORAL | 1 refills | Status: DC

## 2018-11-15 NOTE — Progress Notes (Signed)
BP 126/79 (BP Location: Right Arm, Patient Position: Sitting, Cuff Size: Normal)    Pulse 90    Temp (!) 97.4 F (36.3 C) (Oral)    Ht 5\' 2"  (1.575 m)    Wt 228 lb (103.4 kg)    BMI 41.70 kg/m    Subjective:    Patient ID: Tabitha HouseholderKathleen J Axelson, female    DOB: March 02, 1971, 48 y.o.   MRN: [id number]  HPI: Tabitha [name] is a 48 y.o. female  Chief Complaint  Patient presents with   Diabetes    Patient states blood sugars have been in the 220 range. Otherwise, no complaints-F/U   Hypertension   Obesity   Asthma   Hyperlipidemia     This visit was completed via WebEx due to the restrictions of the COVID-19 pandemic. All issues as above were discussed and addressed. Physical exam was done as above through visual confirmation on WebEx. If it was felt that the patient should be evaluated in the office, they were directed there. The patient verbally consented to this visit.  Location of the patient: home  Location of the provider: home  Those involved with this call:   Provider: Roosvelt Maserachel Jamirra Curnow, PA-C  CMA: Myrtha MantisKeri Bullock, CMA  Front Desk/Registration: Harriet PhoJoliza Johnson   Time spent on call: 25 minutes with patient face to face via video conference. More than 50% of this time was spent in counseling and coordination of care. 5 minutes total spent in review of patient's record and preparation of their chart. I verified patient identity using two factors (patient name and date of birth). Patient consents verbally to being seen via telemedicine visit today.   Here today for chronic condition f/u.   DM - glucometer has been giving lots of error codes lately so she's thinking she needs a new set. Random BSs - 170s-230s home readings lately. Fasting BSs are low 100s. Taking 40 units levemir twice daily . Gabapentin recently increased to 600 mg TID by Psychiatry. Still having some neuropathy in hands and feet despite this change. No low blood sugar spells. States her diet is not as good as it was  previously and has not been exercising like she was.   HTN - Home BPs 120s/70s, taking her medicines faithfully without side effects. Denies CP, SOB, HAs, dizziness.   GERD - stable on low dose prilosec  Asthma - Stable without exacerbations on flovent and albuterol regimen.   Followed by Psychiatry for schizoaffective disorder, OCD, bipolar disorder. Stable on current regimen  Relevant past medical, surgical, family and social history reviewed and updated as indicated. Interim medical history since our last visit reviewed. Allergies and medications reviewed and updated.  Review of Systems  Per HPI unless specifically indicated above     Objective:    BP 126/79 (BP Location: Right Arm, Patient Position: Sitting, Cuff Size: Normal)    Pulse 90    Temp (!) 97.4 F (36.3 C) (Oral)    Ht 5\' 2"  (1.575 m)    Wt 228 lb (103.4 kg)    BMI 41.70 kg/m   Wt Readings from Last 3 Encounters:  11/15/18 228 lb (103.4 kg)  04/28/18 232 lb (105.2 kg)  01/13/18 224 lb 14.4 oz (102 kg)    Physical Exam Vitals signs and nursing note reviewed.  Constitutional:      General: She is not in acute distress.    Appearance: Normal appearance.  HENT:     Head: Atraumatic.     Right Ear:  External ear normal.     Left Ear: External ear normal.     Nose: Nose normal. No congestion.     Mouth/Throat:     Mouth: Mucous membranes are moist.     Pharynx: Oropharynx is clear. No posterior oropharyngeal erythema.  Eyes:     Extraocular Movements: Extraocular movements intact.     Conjunctiva/sclera: Conjunctivae normal.  Neck:     Musculoskeletal: Normal range of motion.  Cardiovascular:     Comments: Unable to assess via virtual visit Pulmonary:     Effort: Pulmonary effort is normal. No respiratory distress.  Musculoskeletal: Normal range of motion.  Skin:    General: Skin is dry.     Findings: No erythema.  Neurological:     Mental Status: She is alert and oriented to person, place, and time.    Psychiatric:        Mood and Affect: Mood normal.        Thought Content: Thought content normal.        Judgment: Judgment normal.     Results for orders placed or performed in visit on 04/28/18  Microscopic Examination   URINE  Result Value Ref Range   WBC, UA 0-5 0 - 5 /hpf   RBC, UA 0-2 0 - 2 /hpf   Epithelial Cells (non renal) 0-10 0 - 10 /hpf   Renal Epithel, UA 0-10 (A) None seen /hpf   Bacteria, UA None seen None seen/Few  CBC with Differential/Platelet  Result Value Ref Range   WBC 6.4 3.4 - 10.8 x10E3/uL   RBC 4.61 3.77 - 5.28 x10E6/uL   Hemoglobin 13.3 11.1 - 15.9 g/dL   Hematocrit 39.6 34.0 - 46.6 %   MCV 86 79 - 97 fL   MCH 28.9 26.6 - 33.0 pg   MCHC 33.6 31.5 - 35.7 g/dL   RDW 15.1 12.3 - 15.4 %   Platelets 201 150 - 450 x10E3/uL   Neutrophils 54 Not Estab. %   Lymphs 37 Not Estab. %   Monocytes 6 Not Estab. %   Eos 2 Not Estab. %   Basos 1 Not Estab. %   Neutrophils Absolute 3.5 1.4 - 7.0 x10E3/uL   Lymphocytes Absolute 2.4 0.7 - 3.1 x10E3/uL   Monocytes Absolute 0.4 0.1 - 0.9 x10E3/uL   EOS (ABSOLUTE) 0.1 0.0 - 0.4 x10E3/uL   Basophils Absolute 0.0 0.0 - 0.2 x10E3/uL   Immature Granulocytes 0 Not Estab. %   Immature Grans (Abs) 0.0 0.0 - 0.1 x10E3/uL  Comprehensive metabolic panel  Result Value Ref Range   Glucose 150 (H) 65 - 99 mg/dL   BUN 14 6 - 24 mg/dL   Creatinine, Ser 0.79 0.57 - 1.00 mg/dL   GFR calc non Af Amer 90 >59 mL/min/1.73   GFR calc Af Amer 104 >59 mL/min/1.73   BUN/Creatinine Ratio 18 9 - 23   Sodium 139 134 - 144 mmol/L   Potassium 4.0 3.5 - 5.2 mmol/L   Chloride 95 (L) 96 - 106 mmol/L   CO2 24 20 - 29 mmol/L   Calcium 10.1 8.7 - 10.2 mg/dL   Total Protein 6.6 6.0 - 8.5 g/dL   Albumin 4.4 3.5 - 5.5 g/dL   Globulin, Total 2.2 1.5 - 4.5 g/dL   Albumin/Globulin Ratio 2.0 1.2 - 2.2   Bilirubin Total 0.3 0.0 - 1.2 mg/dL   Alkaline Phosphatase 86 39 - 117 IU/L   AST 14 0 - 40 IU/L   ALT 13 0 -  32 IU/L  Lipid Panel w/o Chol/HDL  Ratio  Result Value Ref Range   Cholesterol, Total 168 100 - 199 mg/dL   Triglycerides 960261 (H) 0 - 149 mg/dL   HDL 53 >45>39 mg/dL   VLDL Cholesterol Cal 52 (H) 5 - 40 mg/dL   LDL Calculated 63 0 - 99 mg/dL  TSH  Result Value Ref Range   TSH 1.290 0.450 - 4.500 uIU/mL  UA/M w/rflx Culture, Routine   Specimen: Urine   URINE  Result Value Ref Range   Specific Gravity, UA 1.010 1.005 - 1.030   pH, UA 5.5 5.0 - 7.5   Color, UA Yellow Yellow   Appearance Ur Clear Clear   Leukocytes, UA Negative Negative   Protein, UA Negative Negative/Trace   Glucose, UA 2+ (A) Negative   Ketones, UA Negative Negative   RBC, UA Negative Negative   Bilirubin, UA Negative Negative   Urobilinogen, Ur 0.2 0.2 - 1.0 mg/dL   Nitrite, UA Negative Negative   Microscopic Examination See below:   HgB A1c  Result Value Ref Range   Hgb A1c MFr Bld 6.2 (H) 4.8 - 5.6 %   Est. average glucose Bld gHb Est-mCnc 131 mg/dL      Assessment & Plan:   Problem List Items Addressed This Visit      Cardiovascular and Mediastinum   Hypertension associated with diabetes (HCC)    Stable and WNL, continue current regimen      Relevant Medications   atorvastatin (LIPITOR) 20 MG tablet   empagliflozin (JARDIANCE) 25 MG TABS tablet   metFORMIN (GLUCOPHAGE) 500 MG tablet   pioglitazone (ACTOS) 45 MG tablet     Digestive   GERD without esophagitis    Stable, continue prilosec regimen        Endocrine   Controlled type 2 diabetes mellitus with diabetic neuropathy, with long-term current use of insulin (HCC)    Script faxed for new glucometer, will continue current regimen other than increase levemir to 44 units BID with careful monitoring of home readings first thing in the morning and afternoon. Work hard on diet and exercise changes.       Relevant Medications   atorvastatin (LIPITOR) 20 MG tablet   empagliflozin (JARDIANCE) 25 MG TABS tablet   metFORMIN (GLUCOPHAGE) 500 MG tablet   pioglitazone (ACTOS) 45 MG  tablet     Other   Hyperlipidemia - Primary    Recheck lipids, adjust as needed. 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 Continue per their recommendations      BMI 40.0-44.9, adult (HCC)   Relevant Medications   empagliflozin (JARDIANCE) 25 MG TABS tablet   metFORMIN (GLUCOPHAGE) 500 MG tablet   pioglitazone (ACTOS) 45 MG tablet    Other Visit Diagnoses    Essential hypertension       Relevant Medications   atorvastatin (LIPITOR) 20 MG tablet   Other Relevant Orders   Comprehensive metabolic  panel   Type 2 diabetes mellitus without complication, without long-term current use of insulin (HCC)       Relevant Medications   atorvastatin (LIPITOR) 20 MG tablet   empagliflozin (JARDIANCE) 25 MG TABS tablet   metFORMIN (GLUCOPHAGE) 500 MG tablet   pioglitazone (ACTOS) 45 MG tablet   Other Relevant Orders   HgB A1c   Controlled type 2 diabetes mellitus without complication, with long-term current use of insulin (HCC)       Relevant Medications   atorvastatin (LIPITOR) 20 MG tablet   empagliflozin (JARDIANCE) 25 MG TABS tablet   metFORMIN (GLUCOPHAGE) 500 MG tablet   pioglitazone (ACTOS) 45 MG tablet       Follow up plan: Return in about 3 months (around 02/15/2019) for DM f/u.

## 2018-11-15 NOTE — Assessment & Plan Note (Signed)
Script faxed for new glucometer, will continue current regimen other than increase levemir to 44 units BID with careful monitoring of home readings first thing in the morning and afternoon. Work hard on diet and exercise changes.

## 2018-11-16 MED ORDER — VIIBRYD 40 MG PO TABS: 40.0000 mg | ORAL_TABLET | Freq: Every morning | ORAL | 1 refills | Status: DC

## 2018-11-16 MED ORDER — CLONAZEPAM 0.5 MG PO TABS: 0.2500 mg | ORAL_TABLET | Freq: Two times a day (BID) | ORAL | 1 refills | Status: DC | PRN

## 2018-11-16 MED ORDER — DIVALPROEX SODIUM 250 MG PO DR TAB: 250.0000 mg | DELAYED_RELEASE_TABLET | Freq: Every morning | ORAL | 1 refills | Status: DC

## 2018-11-16 MED ORDER — AMANTADINE HCL 100 MG PO CAPS: 100.0000 mg | ORAL_CAPSULE | Freq: Two times a day (BID) | ORAL | 6 refills | Status: DC

## 2018-11-18 NOTE — Assessment & Plan Note (Signed)
Stable, continue prilosec regimen

## 2018-11-18 NOTE — Assessment & Plan Note (Signed)
Stable and WNL, continue current regimen 

## 2018-11-18 NOTE — Assessment & Plan Note (Signed)
Recheck lipids, adjust as needed. Lifestyle modifications reviewed 

## 2018-11-18 NOTE — Assessment & Plan Note (Signed)
Stable, continue per Psychiatry recommendation

## 2018-11-18 NOTE — Assessment & Plan Note (Signed)
Followed by Psychiatry, stable on current regimen. Continue per their recommendations

## 2018-11-18 NOTE — Assessment & Plan Note (Signed)
Diet and exercise reviewed 

## 2018-11-21 ENCOUNTER — Ambulatory Visit: Payer: Medicare Other

## 2018-11-22 ENCOUNTER — Ambulatory Visit (INDEPENDENT_AMBULATORY_CARE_PROVIDER_SITE_OTHER): Payer: Medicare Other

## 2018-11-22 ENCOUNTER — Other Ambulatory Visit: Payer: Self-pay

## 2018-11-22 VITALS — BP 141/86 | HR 87 | Temp 98.0°F | Ht 62.0 in | Wt 234.4 lb

## 2018-11-22 DIAGNOSIS — F3130 Bipolar disorder, current episode depressed, mild or moderate severity, unspecified: Secondary | ICD-10-CM

## 2018-11-22 DIAGNOSIS — F319 Bipolar disorder, unspecified: Secondary | ICD-10-CM

## 2018-11-22 DIAGNOSIS — F250 Schizoaffective disorder, bipolar type: Secondary | ICD-10-CM

## 2018-11-22 DIAGNOSIS — F259 Schizoaffective disorder, unspecified: Secondary | ICD-10-CM

## 2018-11-22 DIAGNOSIS — F251 Schizoaffective disorder, depressive type: Secondary | ICD-10-CM

## 2018-11-22 DIAGNOSIS — F428 Other obsessive-compulsive disorder: Secondary | ICD-10-CM

## 2018-11-22 DIAGNOSIS — F429 Obsessive-compulsive disorder, unspecified: Secondary | ICD-10-CM

## 2018-11-22 DIAGNOSIS — F422 Mixed obsessional thoughts and acts: Secondary | ICD-10-CM

## 2018-11-22 DIAGNOSIS — F3170 Bipolar disorder, currently in remission, most recent episode unspecified: Secondary | ICD-10-CM

## 2018-11-22 NOTE — Patient Instructions (Signed)
Invega Sustenna 234 mg/1.5 ml IM monthly injection. Invega Sustenna prepared as ordered and given to patient in herleft upper outer gluteal area. Patient tolerated due injection without any complaint of pain or discomfort and agreed to return in 30 days for next due injection. Exp. Date 05-2020 ndc [id number] lot # [id number] s/n# [id number] 

## 2018-11-22 NOTE — Progress Notes (Signed)
Invega Sustenna 234 mg/1.5 ml IM monthly injection. Lorayne Bender Sustenna prepared as ordered and given to patient in herleft upper outer gluteal area. Patient tolerated due injection without any complaint of pain or discomfort and agreed to return in 30 days for next due injection. Exp. Date 05-2020 ndc [id number] lot # [id number] s/n# [id number]

## 2018-11-27 ENCOUNTER — Other Ambulatory Visit: Payer: Self-pay | Admitting: Family Medicine

## 2018-12-19 ENCOUNTER — Other Ambulatory Visit: Payer: Self-pay | Admitting: Psychiatry

## 2018-12-19 ENCOUNTER — Encounter: Payer: Self-pay | Admitting: Psychiatry

## 2018-12-19 ENCOUNTER — Other Ambulatory Visit: Payer: Self-pay

## 2018-12-19 ENCOUNTER — Ambulatory Visit (INDEPENDENT_AMBULATORY_CARE_PROVIDER_SITE_OTHER): Payer: Medicare Other | Admitting: Psychiatry

## 2018-12-19 DIAGNOSIS — F251 Schizoaffective disorder, depressive type: Secondary | ICD-10-CM

## 2018-12-19 MED ORDER — VIIBRYD 40 MG PO TABS: 40.0000 mg | ORAL_TABLET | Freq: Every morning | ORAL | 1 refills | Status: DC

## 2018-12-19 MED ORDER — GABAPENTIN 600 MG PO TABS: 600.0000 mg | ORAL_TABLET | Freq: Three times a day (TID) | ORAL | 3 refills | Status: DC

## 2018-12-19 NOTE — Progress Notes (Signed)
Patient ID: Tabitha [name], female   DOB: 08-04-1970, 48 y.o.   MRN: [id number]   Patient is a 48 year old female with history of schizoaffective disorder chronic for medication adjustment. She reported that she has been having auditory hallucinations for the past couple of weeks calling her name and denied having any Command auditory hallucinations. She reported that sometimes she thinks that her children are her husband are calling her. She reported that she is able to cope with the auditory hallucinations and does not want to change her medications at this time. She will get her invega  sustenna injection tomorrow.  Patient reported that she sleeping only four hours at night as she has to go to the bathroom multiple times related to her diabetes and has been talking to her husband at night. She reported that she is trying to lose weight. She denied having any suicidal or homicidal ideations. She feels that the Klonopin is helping with her anxiety.  Discussed with  patient about inpatient admission if her hallucinations are getting worse and she agreed with the plan.  Plan I will continue with her medications. She will receive her injection tomorrow. Patient will follow-up in two Weeks earlier depending on her symptoms. Advise patient to seek help in the local ER if she noticed worsening of her symptoms and she agreed with the plan.    I connected with patient via telemedicine application and verified that I am speaking with the correct person using two identifiers.  I discussed the limitations of evaluation and management by telemedicine and the availability of in person appointments. The patient expressed understanding and agreed to proceed.   I discussed the assessment and treatment plan with the patient. The patient was provided an opportunity to ask questions and all were answered. The patient agreed with the plan and demonstrated an understanding of the instructions.   The patient was  advised to call back or seek an in-person evaluation if the symptoms worsen or if the condition fails to improve as anticipated.   I provided 15 minutes of non-face-to-face time during this encounter.

## 2018-12-20 ENCOUNTER — Ambulatory Visit: Payer: Medicare Other

## 2018-12-26 ENCOUNTER — Ambulatory Visit (INDEPENDENT_AMBULATORY_CARE_PROVIDER_SITE_OTHER): Payer: Medicare Other

## 2018-12-26 ENCOUNTER — Telehealth: Payer: Self-pay | Admitting: Psychiatry

## 2018-12-26 ENCOUNTER — Other Ambulatory Visit: Payer: Self-pay

## 2018-12-26 VITALS — BP 149/82 | HR 80 | Temp 97.6°F | Ht 62.0 in | Wt 238.9 lb

## 2018-12-26 DIAGNOSIS — F251 Schizoaffective disorder, depressive type: Secondary | ICD-10-CM | POA: Diagnosis not present

## 2018-12-26 DIAGNOSIS — F259 Schizoaffective disorder, unspecified: Secondary | ICD-10-CM | POA: Diagnosis not present

## 2018-12-26 DIAGNOSIS — F3170 Bipolar disorder, currently in remission, most recent episode unspecified: Secondary | ICD-10-CM

## 2018-12-26 MED ORDER — PALIPERIDONE PALMITATE ER 234 MG/1.5ML IM SUSY
234.0000 mg | PREFILLED_SYRINGE | Freq: Once | INTRAMUSCULAR | Status: AC
  Administered 2018-12-26: 234 mg via INTRAMUSCULAR

## 2018-12-26 NOTE — Progress Notes (Signed)
Invega Sustenna 234 mg/1.5 ml IM monthly injection. [name] prepared as ordered and given to patient in her right upper outer gluteal area.   Patient tolerated due injection without any complaint of pain or discomfort and agreed to return in 30 days for next due injection.  Pt sates that sometime she does feel like the medication is not working. Pt was advised that she needed to make sure that she doesn't go over the 28- 30 day for medication. Pt states that the pharmacy did not have this last time. Pt was advised that she can request that they see if another pharmacy has it supplied and if she has an issue to call me to check on what pharmacy would have in stock.   invega sustenna 234mg  was given today rt upper outer gluteal area.  Exp. [id number] lot #[id number]# [id number]  Pt to return in 28 days for next injection

## 2018-12-30 NOTE — Telephone Encounter (Signed)
Placed order for Invega sustenna.

## 2019-01-09 ENCOUNTER — Encounter: Payer: Self-pay | Admitting: Psychiatry

## 2019-01-09 ENCOUNTER — Other Ambulatory Visit: Payer: Self-pay

## 2019-01-09 ENCOUNTER — Ambulatory Visit (INDEPENDENT_AMBULATORY_CARE_PROVIDER_SITE_OTHER): Payer: Medicare Other | Admitting: Psychiatry

## 2019-01-09 DIAGNOSIS — F429 Obsessive-compulsive disorder, unspecified: Secondary | ICD-10-CM | POA: Diagnosis not present

## 2019-01-09 DIAGNOSIS — F251 Schizoaffective disorder, depressive type: Secondary | ICD-10-CM

## 2019-01-09 MED ORDER — CLONAZEPAM 0.5 MG PO TABS: 0.2500 mg | ORAL_TABLET | Freq: Two times a day (BID) | ORAL | 1 refills | Status: DC | PRN

## 2019-01-09 MED ORDER — DIVALPROEX SODIUM 500 MG PO DR TAB: 500.0000 mg | DELAYED_RELEASE_TABLET | Freq: Every evening | ORAL | 0 refills | Status: DC

## 2019-01-09 MED ORDER — DIVALPROEX SODIUM 500 MG PO DR TAB: 500.0000 mg | DELAYED_RELEASE_TABLET | Freq: Every evening | ORAL | 1 refills | Status: DC

## 2019-01-09 NOTE — Progress Notes (Signed)
Patient ID: Tabitha [name], female   DOB: December 09, 1970, 48 y.o.   MRN: [id number]   Patient is a 47 year old female with history of schizoaffective disorder and OCD who Evaluated for follow-up.She reported that she has noticed some improvement in her symptoms since her last invaga injection and has been improving.Patient reported occasional auditory hallucinations which are chronic and denied having any command auditory hallucinations. She reported that she is taking clonazepam on a PRN basis to help with her anxiety and it has been helpful. She is compliant with her medications. 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 No other acute symptoms noted. She will get her next injection in 10 days. She is sleeping well at night. She denied current side effects of her medications.  Plan I will call the Refill of Klonopin at the local pharmacy. Continue medications as prescribed. Follow-up in one month earlier depending on her symptoms.  I connected with patient via telemedicine application and verified that I am speaking with the correct person using two identifiers.  I discussed the limitations of evaluation and management by telemedicine and the availability of in person appointments. The patient expressed understanding and agreed to proceed.   I discussed the assessment and treatment plan with the patient. The patient was provided an opportunity to ask questions and all were answered. The patient agreed with the plan and demonstrated an understanding of the instructions.   The patient was advised to call back or seek an in-person evaluation if the symptoms worsen or if the condition fails to improve as anticipated.   I provided 15 minutes of non-face-to-face time during this encounter.

## 2019-01-10 ENCOUNTER — Other Ambulatory Visit: Payer: Self-pay | Admitting: Family Medicine

## 2019-01-10 DIAGNOSIS — L719 Rosacea, unspecified: Secondary | ICD-10-CM

## 2019-01-10 NOTE — Telephone Encounter (Signed)
Requested medication (s) are due for refill today: yes  Requested medication (s) are on the active medication list: yes  Last refill:  02/07/2018  Future visit scheduled: no  Notes to clinic:  Review for refill   Requested Prescriptions  Pending Prescriptions Disp Refills   doxycycline (VIBRAMYCIN) 50 MG capsule [Pharmacy Med Name: DOXYCYCLINE HYC 50MG  CAPS] 90 capsule 1    Sig: TAKE 1 CAPSULE(50 MG) BY MOUTH EVERY MORNING     Off-Protocol Failed - 01/10/2019 10:53 AM      Failed - Medication not assigned to a protocol, review manually.      Passed - Valid encounter within last 12 months    Recent Outpatient Visits          1 month ago Pure hypercholesterolemia   [facility] [place], [place]   8 months ago Controlled type 2 diabetes mellitus with diabetic neuropathy, with long-term current use of insulin [facility])   [place], [place], [place]   1 year ago Essential hypertension   [place], [place] P, [place]   1 year ago Controlled type 2 diabetes mellitus without complication, unspecified whether long term insulin use [facility])   [place], [place], [place]   1 year ago Acute bacterial conjunctivitis of right eye   [facility], [place]      Future Appointments            In 6 days [facility], [place]

## 2019-01-10 NOTE — Telephone Encounter (Signed)
Routing to PCP

## 2019-01-16 ENCOUNTER — Ambulatory Visit: Payer: Self-pay

## 2019-01-18 ENCOUNTER — Ambulatory Visit: Payer: Medicare Other

## 2019-01-19 ENCOUNTER — Ambulatory Visit (INDEPENDENT_AMBULATORY_CARE_PROVIDER_SITE_OTHER): Payer: Medicare Other

## 2019-01-19 ENCOUNTER — Other Ambulatory Visit: Payer: Self-pay | Admitting: Family Medicine

## 2019-01-19 VITALS — BP 146/93 | Ht 62.0 in | Wt 234.0 lb

## 2019-01-19 DIAGNOSIS — Z0000 Encounter for general adult medical examination without abnormal findings: Secondary | ICD-10-CM

## 2019-01-19 NOTE — Patient Instructions (Signed)
Tabitha [name] , Thank you for taking time to come for your Medicare Wellness Visit. I appreciate your ongoing commitment to your health goals. Please review the following plan we discussed and let me know if I can assist you in the future.   Screening recommendations/referrals: Colonoscopy: not indicated Mammogram: Please call [phone] to schedule your mammogram.  Bone Density: not indicated  Recommended yearly ophthalmology/optometry visit for glaucoma screening and checkup Recommended yearly dental visit for hygiene and checkup  Vaccinations: Influenza vaccine: due now  Pneumococcal vaccine: up to date Tdap vaccine: up to date  Shingles vaccine: shingrix eligible     Advanced directives: Advance directive discussed with you today. I have provided a copy for you to complete at home and have notarized. Once this is complete please bring a copy in to our office so we can scan it into your chart.  Conditions/risks identified: diabetic- discussed chronic care management team.    Next appointment: Follow up in one year for your annual wellness visit   Preventive Care 40-64 Years, Female Preventive care refers to lifestyle choices and visits with your health care provider that can promote health and wellness. What does preventive care include?  A yearly physical exam. This is also called an annual well check.  Dental exams once or twice a year.  Routine eye exams. Ask your health care provider how often you should have your eyes checked.  Personal lifestyle choices, including:  Daily care of your teeth and gums.  Regular physical activity.  Eating a healthy diet.  Avoiding tobacco and drug use.  Limiting alcohol use.  Practicing safe sex.  Taking low-dose aspirin daily starting at age 61.  Taking vitamin and mineral supplements as recommended by your health care provider. What happens during an annual well check? The services and screenings done by your health care  provider during your annual well check will depend on your age, overall health, lifestyle risk factors, and family history of disease. Counseling  Your health care provider may ask you questions about your:  Alcohol use.  Tobacco use.  Drug use.  Emotional well-being.  Home and relationship well-being.  Sexual activity.  Eating habits.  Work and work Statistician.  Method of birth control.  Menstrual cycle.  Pregnancy history. Screening  You may have the following tests or measurements:  Height, weight, and BMI.  Blood pressure.  Lipid and cholesterol levels. These may be checked every 5 years, or more frequently if you are over 28 years old.  Skin check.  Lung cancer screening. You may have this screening every year starting at age 33 if you have a 30-pack-year history of smoking and currently smoke or have quit within the past 15 years.  Fecal occult blood test (FOBT) of the stool. You may have this test every year starting at age 1.  Flexible sigmoidoscopy or colonoscopy. You may have a sigmoidoscopy every 5 years or a colonoscopy every 10 years starting at age 60.  Hepatitis C blood test.  Hepatitis B blood test.  Sexually transmitted disease (STD) testing.  Diabetes screening. This is done by checking your blood sugar (glucose) after you have not eaten for a while (fasting). You may have this done every 1-3 years.  Mammogram. This may be done every 1-2 years. Talk to your health care provider about when you should start having regular mammograms. This may depend on whether you have a family history of breast cancer.  BRCA-related cancer screening. This may be done if you  have a family history of breast, ovarian, tubal, or peritoneal cancers.  Pelvic exam and Pap test. This may be done every 3 years starting at age 10. Starting at age 37, this may be done every 5 years if you have a Pap test in combination with an HPV test.  Bone density scan. This is done  to screen for osteoporosis. You may have this scan if you are at high risk for osteoporosis. Discuss your test results, treatment options, and if necessary, the need for more tests with your health care provider. Vaccines  Your health care provider may recommend certain vaccines, such as:  Influenza vaccine. This is recommended every year.  Tetanus, diphtheria, and acellular pertussis (Tdap, Td) vaccine. You may need a Td booster every 10 years.  Zoster vaccine. You may need this after age 18.  Pneumococcal 13-valent conjugate (PCV13) vaccine. You may need this if you have certain conditions and were not previously vaccinated.  Pneumococcal polysaccharide (PPSV23) vaccine. You may need one or two doses if you smoke cigarettes or if you have certain conditions. Talk to your health care provider about which screenings and vaccines you need and how often you need them. This information is not intended to replace advice given to you by your health care provider. Make sure you discuss any questions you have with your health care provider. Document Released: 05/10/2015 Document Revised: 01/01/2016 Document Reviewed: 02/12/2015 Elsevier Interactive Patient Education  2017 [place] Prevention in the Home Falls can cause injuries. They can happen to people of all ages. There are many things you can do to make your home safe and to help prevent falls. What can I do on the outside of my home?  Regularly fix the edges of walkways and driveways and fix any cracks.  Remove anything that might make you trip as you walk through a door, such as a raised step or threshold.  Trim any bushes or trees on the path to your home.  Use bright outdoor lighting.  Clear any walking paths of anything that might make someone trip, such as rocks or tools.  Regularly check to see if handrails are loose or broken. Make sure that both sides of any steps have handrails.  Any raised decks and porches  should have guardrails on the edges.  Have any leaves, snow, or ice cleared regularly.  Use sand or salt on walking paths during winter.  Clean up any spills in your garage right away. This includes oil or grease spills. What can I do in the bathroom?  Use night lights.  Install grab bars by the toilet and in the tub and shower. Do not use towel bars as grab bars.  Use non-skid mats or decals in the tub or shower.  If you need to sit down in the shower, use a plastic, non-slip stool.  Keep the floor dry. Clean up any water that spills on the floor as soon as it happens.  Remove soap buildup in the tub or shower regularly.  Attach bath mats securely with double-sided non-slip rug tape.  Do not have throw rugs and other things on the floor that can make you trip. What can I do in the bedroom?  Use night lights.  Make sure that you have a light by your bed that is easy to reach.  Do not use any sheets or blankets that are too big for your bed. They should not hang down onto the floor.  Have  a firm chair that has side arms. You can use this for support while you get dressed.  Do not have throw rugs and other things on the floor that can make you trip. What can I do in the kitchen?  Clean up any spills right away.  Avoid walking on wet floors.  Keep items that you use a lot in easy-to-reach places.  If you need to reach something above you, use a strong step stool that has a grab bar.  Keep electrical cords out of the way.  Do not use floor polish or wax that makes floors slippery. If you must use wax, use non-skid floor wax.  Do not have throw rugs and other things on the floor that can make you trip. What can I do with my stairs?  Do not leave any items on the stairs.  Make sure that there are handrails on both sides of the stairs and use them. Fix handrails that are broken or loose. Make sure that handrails are as long as the stairways.  Check any carpeting to  make sure that it is firmly attached to the stairs. Fix any carpet that is loose or worn.  Avoid having throw rugs at the top or bottom of the stairs. If you do have throw rugs, attach them to the floor with carpet tape.  Make sure that you have a light switch at the top of the stairs and the bottom of the stairs. If you do not have them, ask someone to add them for you. What else can I do to help prevent falls?  Wear shoes that:  Do not have high heels.  Have rubber bottoms.  Are comfortable and fit you well.  Are closed at the toe. Do not wear sandals.  If you use a stepladder:  Make sure that it is fully opened. Do not climb a closed stepladder.  Make sure that both sides of the stepladder are locked into place.  Ask someone to hold it for you, if possible.  Clearly mark and make sure that you can see:  Any grab bars or handrails.  First and last steps.  Where the edge of each step is.  Use tools that help you move around (mobility aids) if they are needed. These include:  Canes.  Walkers.  Scooters.  Crutches.  Turn on the lights when you go into a dark area. Replace any light bulbs as soon as they burn out.  Set up your furniture so you have a clear path. Avoid moving your furniture around.  If any of your floors are uneven, fix them.  If there are any pets around you, be aware of where they are.  Review your medicines with your doctor. Some medicines can make you feel dizzy. This can increase your chance of falling. Ask your doctor what other things that you can do to help prevent falls. This information is not intended to replace advice given to you by your health care provider. Make sure you discuss any questions you have with your health care provider. Document Released: 02/07/2009 Document Revised: 09/19/2015 Document Reviewed: 05/18/2014 Elsevier Interactive Patient Education  2017 [employer].

## 2019-01-19 NOTE — Progress Notes (Signed)
Subjective:   Tabitha [name] is a 48 y.o. female who presents for Medicare Annual (Subsequent) preventive examination.  This visit is being conducted via phone call  - after an attmept to do on video chat - due to the COVID-19 pandemic. This patient has given me verbal consent via phone to conduct this visit, patient states they are participating from their home address. Some vital signs may be absent or patient reported.   Patient identification: identified by name, DOB, and current address.    Review of Systems:   Cardiac Risk Factors include: diabetes mellitus;dyslipidemia;hypertension;obesity (BMI >30kg/m2)     Objective:     Vitals: BP (!) 146/93 Comment: pt reported  Ht 5\' 2"  (1.575 m)   Wt 234 lb (106.1 kg) Comment: pt reported  BMI 42.80 kg/m   Body mass index is 42.8 kg/m.  Advanced Directives 01/19/2019 01/13/2018 01/13/2017  Does Patient Have a Medical Advance Directive? No No No  Would patient like information on creating a medical advance directive? - Yes (MAU/Ambulatory/Procedural Areas - Information given) Yes (MAU/Ambulatory/Procedural Areas - Information given)  Some encounter information is confidential and restricted. Go to Review Flowsheets activity to see all data.    Tobacco Social History   Tobacco Use  Smoking Status Never Smoker  Smokeless Tobacco Never Used     Counseling given: Not Answered   Clinical Intake:  Pre-visit preparation completed: Yes  Pain : 0-10     Nutritional Status: BMI > 30  Obese Nutritional Risks: None Diabetes: Yes CBG done?: No Did pt. bring in CBG monitor from home?: No  How often do you need to have someone help you when you read instructions, pamphlets, or other written materials from your doctor or pharmacy?: 1 - Never  Nutrition Risk Assessment:  Has the patient had any N/V/D within the last 2 months?  No  Does the patient have any non-healing wounds?  No  Has the patient had any unintentional weight  loss or weight gain?  No   Diabetes:  Is the patient diabetic?  Yes  If diabetic, was a CBG obtained today?  No  Did the patient bring in their glucometer from home?  No  How often do you monitor your CBG's? 1-2 times a day.   Financial Strains and Diabetes Management:  Are you having any financial strains with the device, your supplies or your medication? no Does the patient want to be seen by Chronic Care Management for management of their diabetes?  No  Would the patient like to be referred to a Nutritionist or for Diabetic Management?  No   Diabetic Exams:  Diabetic Eye Exam: due, patient will call [facility] to schedule.   Diabetic Foot Exam: Completed 04/28/2018.    Interpreter Needed?: No  Information entered by :: Paetyn Pietrzak,LPN  Past Medical History:  Diagnosis Date  . Allergic rhinitis   . Anxiety   . Asthma   . Bipolar disorder (HCC)   . Depression   . Diabetes mellitus without complication (HCC)   . GERD (gastroesophageal reflux disease)   . Hyperlipidemia   . Mild sleep apnea   . OCD (obsessive compulsive disorder)   . Palpitations   . Paranoia (HCC)   . Schizoaffective disorder (HCC)   . Sleep apnea    Past Surgical History:  Procedure Laterality Date  . CESAREAN SECTION  2006/2008  . CHOLECYSTECTOMY  2006   Family History  Problem Relation Age of Onset  . Diabetes Father   .  Hyperlipidemia Father   . Diabetes Brother   . Lupus Mother   . Diabetes Mother        pre diabetes   Social History   Socioeconomic History  . Marital status: Married    Spouse name: kevin  . Number of children: 2  . Years of education: Not on file  . Highest education level: Bachelor's degree (e.g., BA, AB, BS)  Occupational History  . Not on file  Social Needs  . Financial resource strain: Not hard at all  . Food insecurity    Worry: Sometimes true    Inability: Sometimes true  . Transportation needs    Medical: No    Non-medical: No  Tobacco  Use  . Smoking status: Never Smoker  . Smokeless tobacco: Never Used  Substance and Sexual Activity  . Alcohol use: No    Alcohol/week: 0.0 standard drinks  . Drug use: No  . Sexual activity: Yes    Partners: Male    Birth control/protection: None, Surgical  Lifestyle  . Physical activity    Days per week: 0 days    Minutes per session: 0 min  . Stress: To some extent  Relationships  . Social connections    Talks on phone: More than three times a week    Gets together: More than three times a week    Attends religious service: More than 4 times per year    Active member of club or organization: Yes    Attends meetings of clubs or organizations: More than 4 times per year    Relationship status: Married  Other Topics Concern  . Not on file  Social History Narrative  . Not on file    Outpatient Encounter Medications as of 01/19/2019  Medication Sig  . albuterol (PROAIR HFA) 108 (90 Base) MCG/ACT inhaler Inhale 2 puffs into the lungs every 6 (six) hours as needed for wheezing or shortness of breath.  Marland Kitchen amantadine (SYMMETREL) 100 MG capsule Take 1 capsule (100 mg total) by mouth 2 (two) times daily.  Marland Kitchen atorvastatin (LIPITOR) 20 MG tablet TAKE 1 TABLET(20 MG) BY MOUTH AT BEDTIME  . clonazePAM (KLONOPIN) 0.5 MG tablet Take 0.5 tablets (0.25 mg total) by mouth 2 (two) times daily as needed for anxiety.  . divalproex (DEPAKOTE) 250 MG DR tablet Take 1 tablet (250 mg total) by mouth every morning.  . divalproex (DEPAKOTE) 500 MG DR tablet Take 1 tablet (500 mg total) by mouth every evening.  Marland Kitchen doxycycline (VIBRAMYCIN) 50 MG capsule TAKE 1 CAPSULE(50 MG) BY MOUTH EVERY MORNING  . empagliflozin (JARDIANCE) 25 MG TABS tablet Take 25 mg by mouth daily.  . fluticasone (FLOVENT HFA) 110 MCG/ACT inhaler Inhale 1 puff into the lungs 2 (two) times daily.  Marland Kitchen gabapentin (NEURONTIN) 600 MG tablet Take 1 tablet (600 mg total) by mouth 3 (three) times daily.  Marland Kitchen glucose blood (ACCU-CHEK AVIVA PLUS)  test strip 1 each by Other route as needed for other. Use as instructed  . hydrochlorothiazide (HYDRODIURIL) 12.5 MG tablet TAKE 1 TABLET(12.5 MG) BY MOUTH DAILY  . Insulin Detemir (LEVEMIR FLEXTOUCH) 100 UNIT/ML Pen Inject 40 Units into the skin 2 (two) times daily.  . Insulin Pen Needle (BD PEN NEEDLE NANO U/F) 32G X 4 MM MISC 1 each by Does not apply route daily.  . metFORMIN (GLUCOPHAGE) 500 MG tablet Take 2 tablets (1,000 mg total) by mouth 2 (two) times daily with a meal.  . NOVOTWIST 32G X 5 MM MISC  USE WITH LEVEMIR PEN BID  . omeprazole (PRILOSEC) 10 MG capsule TAKE 1 CAPSULE(10 MG) BY MOUTH DAILY  . paliperidone (INVEGA SUSTENNA) 234 MG/1.5ML SUSY injection Inject 234 mg into the muscle once for 1 dose.  . pioglitazone (ACTOS) 45 MG tablet Take 1 tablet (45 mg total) by mouth daily.  . Vilazodone HCl (VIIBRYD) 40 MG TABS Take 1 tablet (40 mg total) by mouth every morning.  . [DISCONTINUED] divalproex (DEPAKOTE) 250 MG DR tablet TAKE 1 TABLET BY MOUTH EVERY MORNING  . [DISCONTINUED] divalproex (DEPAKOTE) 500 MG DR tablet Take 1 tablet (500 mg total) by mouth every evening.  . [DISCONTINUED] VIIBRYD 40 MG TABS TAKE 1 TABLET BY MOUTH EVERY MORNING (Patient not taking: Reported on 01/19/2019)   No facility-administered encounter medications on file as of 01/19/2019.     Activities of Daily Living In your present state of health, do you have any difficulty performing the following activities: 01/19/2019 04/28/2018  Hearing? N N  Comment no hearing aids -  Vision? N Y  Comment eyeglasses, goes to [facility] -  Difficulty concentrating or making decisions? N Y  Comment sometimes it comes back -  Walking or climbing stairs? Y Y  Dressing or bathing? N N  Doing errands, shopping? N N  Preparing Food and eating ? N -  Using the Toilet? N -  In the past six months, have you accidently leaked urine? Y -  Comment pads -  Do you have problems with loss of bowel control? N -  Managing  your Medications? N -  Managing your Finances? N -  Housekeeping or managing your Housekeeping? N -  Some recent data might be hidden    Patient Care Team: Particia NearingLane, Rachel Elizabeth, PA-C as PCP - General (Family Medicine) Brandy HaleFaheem, Uzma, MD as Referring Physician (Psychiatry)    Assessment:   This is a routine wellness examination for Nicholos JohnsKathleen.  Exercise Activities and Dietary recommendations Current Exercise Habits: The patient does not participate in regular exercise at present, Exercise limited by: None identified  Goals    . DIET - INCREASE WATER INTAKE     Recommend drinking at least 6-8 glasses of water a day        Fall Risk: Fall Risk  01/19/2019 04/30/2018 04/28/2018 01/13/2018 01/13/2017  Falls in the past year? 1 0 0 No No  Number falls in past yr: 1 - - - -  Injury with Fall? 0 - - - -  Follow up - Falls evaluation completed;Falls prevention discussed Falls evaluation completed - -    FALL RISK PREVENTION PERTAINING TO THE HOME:  Any stairs in or around the home? Yes  If so, are there any without handrails? No   Home free of loose throw rugs in walkways, pet beds, electrical cords, etc? Yes  Adequate lighting in your home to reduce risk of falls? Yes   ASSISTIVE DEVICES UTILIZED TO PREVENT FALLS:  Life alert? No  Use of a cane, walker or w/c? No  Grab bars in the bathroom? Yes  Shower chair or bench in shower? No  Elevated toilet seat or a handicapped toilet? No   TIMED UP AND GO:  Unable to perform    Depression Screen PHQ 2/9 Scores 01/19/2019 04/28/2018 01/13/2018 01/13/2017  PHQ - 2 Score 0 4 2 2   PHQ- 9 Score - 11 8 12   Some encounter information is confidential and restricted. Go to Review Flowsheets activity to see all data.  Cognitive Function     6CIT Screen 01/13/2018 01/13/2017  What Year? 0 points 0 points  What month? 0 points 0 points  What time? 0 points 0 points  Count back from 20 0 points 0 points  Months in reverse 0 points 0 points   Repeat phrase 0 points 4 points  Total Score 0 4    Immunization History  Administered Date(s) Administered  . Influenza,inj,Quad PF,6+ Mos 01/16/2016, 01/13/2017, 01/13/2018  . Influenza-Unspecified 02/07/2015  . Pneumococcal Polysaccharide-23 04/29/2015  . Tdap 01/23/2015    Qualifies for Shingles Vaccine? No  Zostavax completed n/a. Due for Shingrix. Education has been provided regarding the importance of this vaccine. Pt has been advised to call insurance company to determine out of pocket expense. Advised may also receive vaccine at local pharmacy or Health Dept. Verbalized acceptance and understanding.  Tdap: up to date   Flu Vaccine: Due for Flu vaccine.  Pneumococcal Vaccine: up to date   Screening Tests Health Maintenance  Topic Date Due  . MAMMOGRAM  04/10/1990  . OPHTHALMOLOGY EXAM  07/30/2018  . HEMOGLOBIN A1C  10/27/2018  . INFLUENZA VACCINE  11/26/2018  . FOOT EXAM  04/29/2019  . PAP SMEAR-Modifier  01/25/2020  . TETANUS/TDAP  01/25/2025  . PNEUMOCOCCAL POLYSACCHARIDE VACCINE AGE 65-64 HIGH RISK  Completed  . HIV Screening  Discontinued    Cancer Screenings:  Colorectal Screening: no longer required   Mammogram: ordered 04/2018, patient will call and schedule  Bone Density: not indicated   Lung Cancer Screening: (Low Dose CT Chest recommended if Age 69-80 years, 30 pack-year currently smoking OR have quit w/in 15years.) does not qualify.    Additional Screening:  Hepatitis C Screening: does not qualify  Dental Screening: Recommended annual dental exams for proper oral hygiene   Community Resource Referral:  CRR required this visit?  No       Plan:  I have personally reviewed and addressed the Medicare Annual Wellness questionnaire and have noted the following in the patient's chart:  A. Medical and social history B. Use of alcohol, tobacco or illicit drugs  C. Current medications and supplements D. Functional ability and status E.   Nutritional status F.  Physical activity G. Advance directives H. List of other physicians I.  Hospitalizations, surgeries, and ER visits in previous 12 months J.  [place] such as hearing and vision if needed, cognitive and depression L. Referrals and appointments   In addition, I have reviewed and discussed with patient certain preventive protocols, quality metrics, and best practice recommendations. A written personalized care plan for preventive services as well as general preventive health recommendations were provided to patient.   Signed,    Bevelyn Ngo, LPN  07/27/270 Nurse Health Advisor   Nurse Notes: none

## 2019-01-23 ENCOUNTER — Ambulatory Visit (INDEPENDENT_AMBULATORY_CARE_PROVIDER_SITE_OTHER): Payer: Medicare Other

## 2019-01-23 ENCOUNTER — Other Ambulatory Visit: Payer: Self-pay

## 2019-01-23 VITALS — BP 140/84 | HR 83 | Temp 97.5°F | Ht 62.0 in | Wt 238.6 lb

## 2019-01-23 DIAGNOSIS — F251 Schizoaffective disorder, depressive type: Secondary | ICD-10-CM

## 2019-01-23 MED ORDER — PALIPERIDONE PALMITATE ER 234 MG/1.5ML IM SUSY
234.0000 mg | PREFILLED_SYRINGE | Freq: Once | INTRAMUSCULAR | Status: AC
  Administered 2019-01-23: 234 mg via INTRAMUSCULAR

## 2019-01-23 NOTE — Patient Instructions (Signed)
Invega Sustenna 234 mg/1.5 ml IM monthly injection. [name] prepared as ordered and given to patient in her left upper outer gluteal area.   Patient tolerated due injection without any complaint of pain or discomfort and agreed to return in 30 days for next due injection.  invega sustenna 234mg  was given today left upper outer gluteal area.  Exp. [id number] lot #[id number]# [id number]

## 2019-02-16 ENCOUNTER — Ambulatory Visit
Admission: RE | Admit: 2019-02-16 | Discharge: 2019-02-16 | Disposition: A | Discharge status: Home / Self Care | Payer: Medicare Other | Attending: Family Medicine | Admitting: Family Medicine

## 2019-02-16 ENCOUNTER — Other Ambulatory Visit: Payer: Self-pay | Admitting: Family Medicine

## 2019-02-16 ENCOUNTER — Ambulatory Visit
Admission: RE | Admit: 2019-02-16 | Discharge: 2019-02-16 | Disposition: A | Discharge status: Home / Self Care | Payer: Medicare Other | Source: Ambulatory Visit | Attending: Family Medicine | Admitting: Family Medicine

## 2019-02-16 ENCOUNTER — Encounter: Payer: Self-pay | Admitting: Family Medicine

## 2019-02-16 ENCOUNTER — Ambulatory Visit (INDEPENDENT_AMBULATORY_CARE_PROVIDER_SITE_OTHER): Payer: Medicare Other | Admitting: Family Medicine

## 2019-02-16 ENCOUNTER — Other Ambulatory Visit: Payer: Self-pay

## 2019-02-16 VITALS — BP 143/74 | HR 84 | Temp 98.4°F

## 2019-02-16 DIAGNOSIS — M25562 Pain in left knee: Secondary | ICD-10-CM | POA: Insufficient documentation

## 2019-02-16 MED ORDER — MELOXICAM 15 MG PO TABS: 15.0000 mg | ORAL_TABLET | Freq: Every day | ORAL | 0 refills | Status: DC

## 2019-02-16 NOTE — Progress Notes (Signed)
BP (!) 143/74   Pulse 84   Temp 98.4 F (36.9 C)   SpO2 98%    Subjective:    Patient ID: Tabitha [name], female    DOB: [birth year], 48 y.o.   MRN: [id number]  HPI: Tabitha [name] is a 48 y.o. female  Chief Complaint  Patient presents with  . Knee Pain    left, fell 2 a couple of weeks ago and hit the same knee   Patient presenting with over a week of left anterior knee pain and mild swelling after two mechanical falls landing on knee. Tripped both times, which she states was not from feeling dizzy, weak, or unstable. No bruising, redness, abrasions to the knee. Weight bearing makes it worse, rest seems to improve it. Has been trying tylenol with mild relief and wearing a soft brace. No known hx of knee injury.   Relevant past medical, surgical, family and social history reviewed and updated as indicated. Interim medical history since our last visit reviewed. Allergies and medications reviewed and updated.  Review of Systems  Per HPI unless specifically indicated above     Objective:    BP (!) 143/74   Pulse 84   Temp 98.4 F (36.9 C)   SpO2 98%   Wt Readings from Last 3 Encounters:  01/19/19 234 lb (106.1 kg)  11/15/18 228 lb (103.4 kg)  04/28/18 232 lb (105.2 kg)    Physical Exam Vitals signs and nursing note reviewed.  Constitutional:      Appearance: Normal appearance. She is not ill-appearing.  HENT:     Head: Atraumatic.  Eyes:     Extraocular Movements: Extraocular movements intact.     Conjunctiva/sclera: Conjunctivae normal.  Neck:     Musculoskeletal: Normal range of motion and neck supple.  Cardiovascular:     Rate and Rhythm: Normal rate and regular rhythm.     Heart sounds: Normal heart sounds.  Pulmonary:     Effort: Pulmonary effort is normal.     Breath sounds: Normal breath sounds.  Musculoskeletal: Normal range of motion.        General: Swelling (mild swelling left knee) and tenderness (anterior left knee ttp) present. No deformity.   Skin:    General: Skin is warm and dry.     Findings: No erythema or lesion.  Neurological:     Mental Status: She is alert and oriented to person, place, and time.  Psychiatric:        Mood and Affect: Mood normal.        Thought Content: Thought content normal.        Judgment: Judgment normal.     Results for orders placed or performed in visit on 04/28/18  Microscopic Examination   URINE  Result Value Ref Range   WBC, UA 0-5 0 - 5 /hpf   RBC, UA 0-2 0 - 2 /hpf   Epithelial Cells (non renal) 0-10 0 - 10 /hpf   Renal Epithel, UA 0-10 (A) None seen /hpf   Bacteria, UA None seen None seen/Few  CBC with Differential/Platelet  Result Value Ref Range   WBC 6.4 3.4 - 10.8 x10E3/uL   RBC 4.61 3.77 - 5.28 x10E6/uL   Hemoglobin 13.3 11.1 - 15.9 g/dL   Hematocrit 39.6 34.0 - 46.6 %   MCV 86 79 - 97 fL   MCH 28.9 26.6 - 33.0 pg   MCHC 33.6 31.5 - 35.7 g/dL   RDW 15.1 12.3 - 15.4 %  Platelets 201 150 - 450 x10E3/uL   Neutrophils 54 Not Estab. %   Lymphs 37 Not Estab. %   Monocytes 6 Not Estab. %   Eos 2 Not Estab. %   Basos 1 Not Estab. %   Neutrophils Absolute 3.5 1.4 - 7.0 x10E3/uL   Lymphocytes Absolute 2.4 0.7 - 3.1 x10E3/uL   Monocytes Absolute 0.4 0.1 - 0.9 x10E3/uL   EOS (ABSOLUTE) 0.1 0.0 - 0.4 x10E3/uL   Basophils Absolute 0.0 0.0 - 0.2 x10E3/uL   Immature Granulocytes 0 Not Estab. %   Immature Grans (Abs) 0.0 0.0 - 0.1 x10E3/uL  Comprehensive metabolic panel  Result Value Ref Range   Glucose 150 (H) 65 - 99 mg/dL   BUN 14 6 - 24 mg/dL   Creatinine, Ser 1.51 0.57 - 1.00 mg/dL   GFR calc non Af Amer 90 >59 mL/min/1.73   GFR calc Af Amer 104 >59 mL/min/1.73   BUN/Creatinine Ratio 18 9 - 23   Sodium 139 134 - 144 mmol/L   Potassium 4.0 3.5 - 5.2 mmol/L   Chloride 95 (L) 96 - 106 mmol/L   CO2 24 20 - 29 mmol/L   Calcium 10.1 8.7 - 10.2 mg/dL   Total Protein 6.6 6.0 - 8.5 g/dL   Albumin 4.4 3.5 - 5.5 g/dL   Globulin, Total 2.2 1.5 - 4.5 g/dL   Albumin/Globulin  Ratio 2.0 1.2 - 2.2   Bilirubin Total 0.3 0.0 - 1.2 mg/dL   Alkaline Phosphatase 86 39 - 117 IU/L   AST 14 0 - 40 IU/L   ALT 13 0 - 32 IU/L  Lipid Panel w/o Chol/HDL Ratio  Result Value Ref Range   Cholesterol, Total 168 100 - 199 mg/dL   Triglycerides 761 (H) 0 - 149 mg/dL   HDL 53 >60 mg/dL   VLDL Cholesterol Cal 52 (H) 5 - 40 mg/dL   LDL Calculated 63 0 - 99 mg/dL  TSH  Result Value Ref Range   TSH 1.290 0.450 - 4.500 uIU/mL  UA/M w/rflx Culture, Routine   Specimen: Urine   URINE  Result Value Ref Range   Specific Gravity, UA 1.010 1.005 - 1.030   pH, UA 5.5 5.0 - 7.5   Color, UA Yellow Yellow   Appearance Ur Clear Clear   Leukocytes, UA Negative Negative   Protein, UA Negative Negative/Trace   Glucose, UA 2+ (A) Negative   Ketones, UA Negative Negative   RBC, UA Negative Negative   Bilirubin, UA Negative Negative   Urobilinogen, Ur 0.2 0.2 - 1.0 mg/dL   Nitrite, UA Negative Negative   Microscopic Examination See below:   HgB A1c  Result Value Ref Range   Hgb A1c MFr Bld 6.2 (H) 4.8 - 5.6 %   Est. average glucose Bld gHb Est-mCnc 131 mg/dL      Assessment & Plan:   Problem List Items Addressed This Visit    None    Visit Diagnoses    Acute pain of left knee    -  Primary   No obvious evidence of fractures, tearing. Suspect deep bruising from two falls. Tx with OTC pain relievers, RICE protocol, x-ray to r/o fracture   Relevant Orders   DG Knee Complete 4 Views Left (Completed)       Follow up plan: Return if symptoms worsen or fail to improve.

## 2019-02-16 NOTE — Telephone Encounter (Signed)
Requested medication (s) are due for refill today: yes  Requested medication (s) are on the active medication list: yes  Last refill:  02/16/2019  Future visit scheduled: no  Notes to clinic:  Patient requesting 90 day supply   Requested Prescriptions  Pending Prescriptions Disp Refills   meloxicam (MOBIC) 15 MG tablet [Pharmacy Med Name: MELOXICAM 15MG  TABLETS] 90 tablet     Sig: TAKE 1 TABLET(15 MG) BY MOUTH DAILY     Analgesics:  COX2 Inhibitors Passed - 02/16/2019  1:36 PM      Passed - HGB in normal range and within 360 days    Hemoglobin  Date Value Ref Range Status  04/28/2018 13.3 11.1 - 15.9 g/dL Final         Passed - Cr in normal range and within 360 days    Creat  Date Value Ref Range Status  09/26/2015 0.73 0.50 - 1.10 mg/dL Final   Creatinine, Ser  Date Value Ref Range Status  04/28/2018 0.79 0.57 - 1.00 mg/dL Final         Passed - Patient is not pregnant      Passed - Valid encounter within last 12 months    Recent Outpatient Visits          Today Acute pain of left knee   [facility] [place], [place]   3 months ago Pure hypercholesterolemia   [place], [place], [place]   9 months ago Controlled type 2 diabetes mellitus with diabetic neuropathy, with long-term current use of insulin [facility])   [place], [place], [place]   1 year ago Essential hypertension   [place], Murray P, [place]   1 year ago Controlled type 2 diabetes mellitus without complication, unspecified whether long term insulin use [facility])   [place], [place], [place]

## 2019-02-16 NOTE — Telephone Encounter (Signed)
Routing to provider. Seems like you already refill this but I'm not sure.

## 2019-02-20 ENCOUNTER — Ambulatory Visit (INDEPENDENT_AMBULATORY_CARE_PROVIDER_SITE_OTHER): Payer: Medicare Other

## 2019-02-20 ENCOUNTER — Telehealth: Payer: Self-pay

## 2019-02-20 ENCOUNTER — Other Ambulatory Visit: Payer: Self-pay

## 2019-02-20 VITALS — BP 171/84 | HR 84 | Temp 97.8°F | Ht 62.0 in | Wt 238.2 lb

## 2019-02-20 DIAGNOSIS — F251 Schizoaffective disorder, depressive type: Secondary | ICD-10-CM | POA: Diagnosis not present

## 2019-02-20 DIAGNOSIS — F429 Obsessive-compulsive disorder, unspecified: Secondary | ICD-10-CM

## 2019-02-20 DIAGNOSIS — F3170 Bipolar disorder, currently in remission, most recent episode unspecified: Secondary | ICD-10-CM

## 2019-02-20 MED ORDER — PALIPERIDONE PALMITATE ER 234 MG/1.5ML IM SUSY
234.0000 mg | PREFILLED_SYRINGE | Freq: Once | INTRAMUSCULAR | Status: AC
  Administered 2019-02-20: 234 mg via INTRAMUSCULAR

## 2019-02-20 NOTE — Progress Notes (Signed)
Invega Sustenna 234 mg/1.5 ml IM monthly injection. Tabitha [name] Sustenna prepared as ordered and given to patient in herright upper outer gluteal area.   Patient tolerated due injection without any complaint of pain or discomfort and agreed to return in 30 days for next due injection.  invega sustenna 234mg  was given today right upper outer gluteal area.Exp. [id number] lot #[id number]# [id number]

## 2019-02-20 NOTE — Patient Instructions (Addendum)
[name] 234 mg/1.5 ml IM monthly injection. Tabitha [name] prepared as ordered and given to patient in herright upper outer gluteal area.   Patient tolerated due injection without any complaint of pain or discomfort and agreed to return in 30 days for next due injection.  Tabitha [name] 234mg  was given today right upper outer gluteal area.Exp. [id number] lot #[id number]# [id number]

## 2019-02-20 NOTE — Telephone Encounter (Signed)
pt has invega 234mg  injection need medication order put in please so i can close chart.

## 2019-02-27 ENCOUNTER — Other Ambulatory Visit: Payer: Self-pay | Admitting: Family Medicine

## 2019-02-27 MED ORDER — PREDNISONE 20 MG PO TABS: 40.0000 mg | ORAL_TABLET | Freq: Every day | ORAL | 0 refills | Status: DC

## 2019-02-28 ENCOUNTER — Other Ambulatory Visit: Payer: Self-pay

## 2019-02-28 ENCOUNTER — Encounter: Payer: Self-pay | Admitting: Psychiatry

## 2019-02-28 ENCOUNTER — Ambulatory Visit (INDEPENDENT_AMBULATORY_CARE_PROVIDER_SITE_OTHER): Payer: Medicare Other | Admitting: Psychiatry

## 2019-02-28 DIAGNOSIS — F429 Obsessive-compulsive disorder, unspecified: Secondary | ICD-10-CM | POA: Diagnosis not present

## 2019-02-28 DIAGNOSIS — F251 Schizoaffective disorder, depressive type: Secondary | ICD-10-CM

## 2019-02-28 MED ORDER — DIVALPROEX SODIUM 250 MG PO DR TAB: 250.0000 mg | DELAYED_RELEASE_TABLET | Freq: Every morning | ORAL | 1 refills | Status: DC

## 2019-02-28 MED ORDER — VIIBRYD 40 MG PO TABS: 40.0000 mg | ORAL_TABLET | Freq: Every morning | ORAL | 1 refills | Status: DC

## 2019-02-28 MED ORDER — AMANTADINE HCL 100 MG PO CAPS: 100.0000 mg | ORAL_CAPSULE | Freq: Two times a day (BID) | ORAL | 1 refills | Status: DC

## 2019-02-28 MED ORDER — CLONAZEPAM 0.5 MG PO TABS: 0.2500 mg | ORAL_TABLET | Freq: Two times a day (BID) | ORAL | 2 refills | Status: DC | PRN

## 2019-02-28 MED ORDER — GABAPENTIN 600 MG PO TABS: 600.0000 mg | ORAL_TABLET | Freq: Three times a day (TID) | ORAL | 1 refills | Status: DC

## 2019-02-28 MED ORDER — DIVALPROEX SODIUM 500 MG PO DR TAB: 500.0000 mg | DELAYED_RELEASE_TABLET | Freq: Every evening | ORAL | 1 refills | Status: DC

## 2019-02-28 NOTE — Progress Notes (Signed)
[facility] MD OP Progress Note  I connected with  Tabitha [name] on 02/28/19 by a video enabled telemedicine application and verified that I am speaking with the correct person using two identifiers.   I discussed the limitations of evaluation and management by telemedicine. The patient expressed understanding and agreed to proceed.    02/28/2019 9:45 AM Tabitha [name]  MRN:  [id number]  Chief Complaint:  " I am doing okay."  HPI: Pt reported that overall she is doing okay.  She stated that she still has visual hallucinations of seeing maggots and she sometimes hears someone calling her name.  She also reported having baseline paranoid ideations of someone trying to hurt her.  However she reported that she is able to deal with all this and overall she is managing well.  She reported she is able to sleep well at night and is able to take care of her routine chores. Recently received her monthly Invega injection on October 26th. She spoke about taking care of her children and requested an afternoon appointment in the future to work better with her children's school schedule.  Visit Diagnosis:    ICD-10-CM   1. Schizoaffective disorder, depressive type ([place])  F25.1   2. Obsessive-compulsive disorder, unspecified type  F42.9     Past Psychiatric History: Schizoaffective disorder, OCD  Past Medical History:  Past Medical History:  Diagnosis Date  . Allergic rhinitis   . Anxiety   . Asthma   . Bipolar disorder ([place])   . Depression   . Diabetes mellitus without complication ([place])   . GERD (gastroesophageal reflux disease)   . Hyperlipidemia   . Mild sleep apnea   . OCD (obsessive compulsive disorder)   . Palpitations   . Paranoia ([place])   . Schizoaffective disorder ([place])   . Sleep apnea     Past Surgical History:  Procedure Laterality Date  . CESAREAN SECTION  2006/2008  . CHOLECYSTECTOMY  2006    Family Psychiatric History: denied  Family History:  Family History  Problem  Relation Age of Onset  . Diabetes Father   . Hyperlipidemia Father   . Diabetes Brother   . Lupus Mother   . Diabetes Mother        pre diabetes    Social History:  Social History   Socioeconomic History  . Marital status: Married    Spouse name: kevin  . Number of children: 2  . Years of education: Not on file  . Highest education level: Bachelor's degree (e.g., BA, AB, BS)  Occupational History  . Not on file  Social Needs  . Financial resource strain: Not hard at all  . Food insecurity    Worry: Sometimes true    Inability: Sometimes true  . Transportation needs    Medical: No    Non-medical: No  Tobacco Use  . Smoking status: Never Smoker  . Smokeless tobacco: Never Used  Substance and Sexual Activity  . Alcohol use: No    Alcohol/week: 0.0 standard drinks  . Drug use: No  . Sexual activity: Yes    Partners: Male    Birth control/protection: None, Surgical  Lifestyle  . Physical activity    Days per week: 0 days    Minutes per session: 0 min  . Stress: To some extent  Relationships  . Social connections    Talks on phone: More than three times a week    Gets together: More than three times a week  Attends religious service: More than 4 times per year    Active member of club or organization: Yes    Attends meetings of clubs or organizations: More than 4 times per year    Relationship status: Married  Other Topics Concern  . Not on file  Social History Narrative  . Not on file    Allergies:  Allergies  Allergen Reactions  . Abilify [Aripiprazole] Hives    Metabolic Disorder Labs: Lab Results  Component Value Date   HGBA1C 6.2 (H) 04/28/2018   Lab Results  Component Value Date   PROLACTIN 84.8 (H) 06/01/2017   PROLACTIN 36.2 (H) 03/26/2016   Lab Results  Component Value Date   CHOL 168 04/28/2018   TRIG 261 (H) 04/28/2018   HDL 53 04/28/2018   CHOLHDL 2.9 09/26/2015   VLDL 51 (H) 04/23/2017   LDLCALC 63 04/28/2018   LDLCALC 51  10/22/2017   Lab Results  Component Value Date   TSH 1.290 04/28/2018   TSH 2.570 06/01/2017    Therapeutic Level Labs: No results found for: LITHIUM Lab Results  Component Value Date   VALPROATE 52 10/22/2017   VALPROATE 43 (L) 06/01/2017   No components found for:  CBMZ  Current Medications: Current Outpatient Medications  Medication Sig Dispense Refill  . albuterol (PROAIR HFA) 108 (90 Base) MCG/ACT inhaler Inhale 2 puffs into the lungs every 6 (six) hours as needed for wheezing or shortness of breath. 8.5 g 6  . amantadine (SYMMETREL) 100 MG capsule Take 1 capsule (100 mg total) by mouth 2 (two) times daily. 60 capsule 6  . atorvastatin (LIPITOR) 20 MG tablet TAKE 1 TABLET(20 MG) BY MOUTH AT BEDTIME 90 tablet 1  . clonazePAM (KLONOPIN) 0.5 MG tablet Take 0.5 tablets (0.25 mg total) by mouth 2 (two) times daily as needed for anxiety. 30 tablet 1  . divalproex (DEPAKOTE) 250 MG DR tablet Take 1 tablet (250 mg total) by mouth every morning. 90 tablet 1  . divalproex (DEPAKOTE) 500 MG DR tablet Take 1 tablet (500 mg total) by mouth every evening. 90 tablet 1  . doxycycline (VIBRAMYCIN) 50 MG capsule TAKE 1 CAPSULE(50 MG) BY MOUTH EVERY MORNING 90 capsule 0  . empagliflozin (JARDIANCE) 25 MG TABS tablet Take 25 mg by mouth daily. 90 tablet 1  . fluticasone (FLOVENT HFA) 110 MCG/ACT inhaler Inhale 1 puff into the lungs 2 (two) times daily. 1 Inhaler 11  . gabapentin (NEURONTIN) 600 MG tablet Take 1 tablet (600 mg total) by mouth 3 (three) times daily. 90 tablet 3  . glucose blood (ACCU-CHEK AVIVA PLUS) test strip 1 each by Other route as needed for other. Use as instructed 100 each 12  . hydrochlorothiazide (HYDRODIURIL) 12.5 MG tablet TAKE 1 TABLET(12.5 MG) BY MOUTH DAILY 90 tablet 0  . Insulin Detemir (LEVEMIR FLEXTOUCH) 100 UNIT/ML Pen Inject 40 Units into the skin 2 (two) times daily. 15 mL 3  . Insulin Pen Needle (BD PEN NEEDLE NANO U/F) 32G X 4 MM MISC 1 each by Does not apply  route daily. 100 each 12  . meloxicam (MOBIC) 15 MG tablet Take 1 tablet (15 mg total) by mouth daily. 30 tablet 0  . metFORMIN (GLUCOPHAGE) 500 MG tablet Take 2 tablets (1,000 mg total) by mouth 2 (two) times daily with a meal. 360 tablet 1  . NOVOTWIST 32G X 5 MM MISC USE WITH LEVEMIR PEN BID  5  . omeprazole (PRILOSEC) 10 MG capsule TAKE 1 CAPSULE(10 MG) BY MOUTH  DAILY 90 capsule 1  . paliperidone (INVEGA SUSTENNA) 234 MG/1.5ML SUSY injection Inject 234 mg into the muscle once for 1 dose. 1.5 mL 11  . pioglitazone (ACTOS) 45 MG tablet Take 1 tablet (45 mg total) by mouth daily. 90 tablet 1  . predniSONE (DELTASONE) 20 MG tablet Take 2 tablets (40 mg total) by mouth daily with breakfast. 10 tablet 0  . Vilazodone HCl (VIIBRYD) 40 MG TABS Take 1 tablet (40 mg total) by mouth every morning. 90 tablet 1   No current facility-administered medications for this visit.      Musculoskeletal: Strength & Muscle Tone: unable to assess due to telemed visit Gait & Station: unable to assess due to telemed visit Patient leans: unable to assess due to telemed visit  Psychiatric Specialty Exam: ROS  There were no vitals taken for this visit.There is no height or weight on file to calculate BMI.  General Appearance: Fairly Groomed  Eye Contact:  Good  Speech:  Clear and Coherent and Normal Rate  Volume:  Normal  Mood:  Euthymic  Affect:  Restricted  Thought Process:  Goal Directed, Linear and Descriptions of Associations: Intact  Orientation:  Full (Time, Place, and Person)  Thought Content: Hallucinations: Auditory Visual and Paranoid Ideation   Suicidal Thoughts:  No  Homicidal Thoughts:  No  Memory:  Recent;   Good Remote;   Good  Judgement:  Fair  Insight:  Fair  Psychomotor Activity:  Decreased  Concentration:  Concentration: Good and Attention Span: Good  Recall:  Good  Fund of Knowledge: Good  Language: Good  Akathisia:  No  Handed:  Right  AIMS (if indicated): unable to complete  due to telemed visit  Assets:  Communication Skills Desire for Improvement Financial Resources/Insurance Housing Social Support  ADL's:  Intact  Cognition: WNL  Sleep:  Good   Screenings: AIMS     [facility] Visit from 05/12/2017 in [facility] Office Visit from 07/29/2016 in [facility] Office Visit from 05/06/2016 in [facility] Office Visit from 04/01/2016 in [facility] Office Visit from 03/28/2015 in [facility]  AIMS Total Score  0  0  0  0  0    GAD-7     Office Visit from 04/28/2018 in [facility]  Total GAD-7 Score  8    PHQ2-9     Clinical Support from 01/19/2019 in [facility] Office Visit from 04/28/2018 in [facility] Clinical Support from 01/13/2018 in [facility] Clinical Support from 01/13/2017 in [facility] Office Visit from 09/24/2015 in [place]  PHQ-2 Total Score  0  4  2  2  3   PHQ-9 Total Score  -  11  8  12  8        Assessment and Plan: 48 year old female with history of schizoaffective disorder and OCD currently stable on her regimen including long-acting injectable.  She continues to have baseline hallucinations and paranoia but reports she is able to manage it better than she used to in the past.  1. Schizoaffective disorder, depressive type (H[facility])  - Vilazodone HCl (VIIBRYD) 40 MG TABS; Take 1 tablet (40 mg total) by mouth every morning.  Dispense: 90 tablet; Refill: 1 - gabapentin (NEURONTIN) 600 MG tablet; Take 1 tablet (600 mg total) by mouth 3 (three) times daily.  Dispense: 270 tablet; Refill: 1 - divalproex (DEPAKOTE) 250 MG DR tablet; Take 1 tablet (250 mg  total) by mouth every morning.  Dispense: 90 tablet; Refill: 1 - divalproex (DEPAKOTE) 500 MG DR tablet; Take 1 tablet (500 mg total) by mouth every evening.  Dispense: 90 tablet; Refill:  1 - amantadine (SYMMETREL) 100 MG capsule; Take 1 capsule (100 mg total) by mouth 2 (two) times daily.  Dispense: 180 capsule; Refill: 1  2. Obsessive-compulsive disorder, unspecified type  - Vilazodone HCl (VIIBRYD) 40 MG TABS; Take 1 tablet (40 mg total) by mouth every morning.  Dispense: 90 tablet; Refill: 1 - clonazePAM (KLONOPIN) 0.5 MG tablet; Take 0.5 tablets (0.25 mg total) by mouth 2 (two) times daily as needed for anxiety.  Dispense: 30 tablet; Refill: 2  Continue same regimen and follow-up in 3 months.   Zena AmosMandeep Jovonna Nickell, MD 02/28/2019, 9:45 AM

## 2019-03-09 ENCOUNTER — Other Ambulatory Visit: Payer: Self-pay | Admitting: Family Medicine

## 2019-03-20 ENCOUNTER — Other Ambulatory Visit: Payer: Self-pay

## 2019-03-20 ENCOUNTER — Ambulatory Visit (INDEPENDENT_AMBULATORY_CARE_PROVIDER_SITE_OTHER): Payer: Medicare Other

## 2019-03-20 VITALS — BP 170/90 | HR 97 | Temp 97.9°F | Ht 62.0 in | Wt 236.0 lb

## 2019-03-20 DIAGNOSIS — F3170 Bipolar disorder, currently in remission, most recent episode unspecified: Secondary | ICD-10-CM | POA: Diagnosis not present

## 2019-03-20 DIAGNOSIS — F251 Schizoaffective disorder, depressive type: Secondary | ICD-10-CM | POA: Diagnosis not present

## 2019-03-20 DIAGNOSIS — F429 Obsessive-compulsive disorder, unspecified: Secondary | ICD-10-CM | POA: Diagnosis not present

## 2019-03-20 MED ORDER — PALIPERIDONE PALMITATE ER 234 MG/1.5ML IM SUSY
234.0000 mg | PREFILLED_SYRINGE | Freq: Once | INTRAMUSCULAR | Status: AC
  Administered 2019-03-20: 234 mg via INTRAMUSCULAR

## 2019-03-20 NOTE — Progress Notes (Signed)
nvega Sustenna 234 mg/1.5 ml IM monthly injection. [name] prepared as ordered and given to patient in herleftupper outer gluteal area.   Patient tolerated due injection without any complaint of pain or discomfort and agreed to return in 30 days for next due injection.  invega sustenna 234mg  was given todayleftupper outer gluteal area.Exp. [id number] lot #[id number]# [id number]

## 2019-04-13 ENCOUNTER — Other Ambulatory Visit: Payer: Self-pay

## 2019-04-13 NOTE — Telephone Encounter (Signed)
Refill request for Meloxicam. LOV: 02/16/2019

## 2019-04-14 ENCOUNTER — Other Ambulatory Visit: Payer: Self-pay | Admitting: Family Medicine

## 2019-04-14 ENCOUNTER — Other Ambulatory Visit: Payer: Self-pay | Admitting: Psychiatry

## 2019-04-14 DIAGNOSIS — F251 Schizoaffective disorder, depressive type: Secondary | ICD-10-CM

## 2019-04-14 MED ORDER — MELOXICAM 15 MG PO TABS: ORAL_TABLET | ORAL | 0 refills | Status: DC

## 2019-04-14 NOTE — Telephone Encounter (Signed)
Requested medication (s) are due for refill today: yes  Requested medication (s) are on the active medication list:yes  Last refill:  10/02/2017  Future visit scheduled: no  Notes to clinic:  One inhaler should last at least one month. If the patient is requesting refills earlier, contact the patient to check for uncontrolled symptoms Overdue for office visit    Requested Prescriptions  Pending Prescriptions Disp Refills   albuterol (PROAIR HFA) 108 (90 Base) MCG/ACT inhaler 8.5 g 6    Sig: Inhale 2 puffs into the lungs every 6 (six) hours as needed for wheezing or shortness of breath.      Pulmonology:  Beta Agonists Failed - 04/14/2019  3:08 PM      Failed - One inhaler should last at least one month. If the patient is requesting refills earlier, contact the patient to check for uncontrolled symptoms.      Passed - Valid encounter within last 12 months    Recent Outpatient Visits           1 month ago Acute pain of left knee   [facility] [place], [place]   5 months ago Pure hypercholesterolemia   [facility] [place], [place]   11 months ago Controlled type 2 diabetes mellitus with diabetic neuropathy, with long-term current use of insulin [facility])   [facility], [place], [place]   1 year ago Essential hypertension   [place], Nellie P, [place]   1 year ago Controlled type 2 diabetes mellitus without complication, unspecified whether long term insulin use [facility])   [place], [place], [place]

## 2019-04-14 NOTE — Telephone Encounter (Signed)
Pharm is calling and pt would like a refill on albuterol inhaler

## 2019-04-17 ENCOUNTER — Other Ambulatory Visit: Payer: Self-pay

## 2019-04-17 MED ORDER — ALBUTEROL SULFATE HFA 108 (90 BASE) MCG/ACT IN AERS: 2.0000 | INHALATION_SPRAY | Freq: Four times a day (QID) | RESPIRATORY_TRACT | 6 refills | Status: DC | PRN

## 2019-04-17 MED ORDER — HYDROCHLOROTHIAZIDE 12.5 MG PO TABS: ORAL_TABLET | ORAL | 0 refills | Status: DC

## 2019-04-17 NOTE — Telephone Encounter (Signed)
Patient's last regular OV 11/15/18

## 2019-04-25 ENCOUNTER — Other Ambulatory Visit: Payer: Self-pay

## 2019-04-25 ENCOUNTER — Ambulatory Visit (INDEPENDENT_AMBULATORY_CARE_PROVIDER_SITE_OTHER): Payer: Medicare Other

## 2019-04-25 DIAGNOSIS — F429 Obsessive-compulsive disorder, unspecified: Secondary | ICD-10-CM | POA: Diagnosis not present

## 2019-04-25 DIAGNOSIS — F3170 Bipolar disorder, currently in remission, most recent episode unspecified: Secondary | ICD-10-CM

## 2019-04-25 DIAGNOSIS — F251 Schizoaffective disorder, depressive type: Secondary | ICD-10-CM | POA: Diagnosis not present

## 2019-04-25 MED ORDER — PALIPERIDONE PALMITATE ER 234 MG/1.5ML IM SUSY
234.0000 mg | PREFILLED_SYRINGE | INTRAMUSCULAR | Status: AC
  Administered 2019-04-25 – 2019-12-14 (×8): 234 mg via INTRAMUSCULAR

## 2019-04-25 NOTE — Progress Notes (Signed)
Patient tolerated due injection without any complaint of pain or discomfort and agreed to return in 30 days for next due injection.  invega sustenna 234mg  was given todayleftupper outer gluteal area.Exp. [id number] lot #[id number]# [id number]  GTIN: [id number]  NDC# [phone]

## 2019-04-25 NOTE — Telephone Encounter (Signed)
Standing order for monthly Invega injection placed.

## 2019-04-25 NOTE — Patient Instructions (Signed)
Patient tolerated due injection without any complaint of pain or discomfort and agreed to return in 30 days for next due injection.  invega sustenna 234mg was given todayleftupper outer gluteal area.Exp. [id number] lot #[id number]/n# [id number]  GTIN: [id number]  NDC# [id number] 

## 2019-05-08 ENCOUNTER — Other Ambulatory Visit: Payer: Self-pay

## 2019-05-08 ENCOUNTER — Ambulatory Visit (INDEPENDENT_AMBULATORY_CARE_PROVIDER_SITE_OTHER): Payer: Medicare Other | Admitting: Psychiatry

## 2019-05-08 ENCOUNTER — Encounter: Payer: Self-pay | Admitting: Psychiatry

## 2019-05-08 DIAGNOSIS — F251 Schizoaffective disorder, depressive type: Secondary | ICD-10-CM | POA: Diagnosis not present

## 2019-05-08 DIAGNOSIS — F429 Obsessive-compulsive disorder, unspecified: Secondary | ICD-10-CM

## 2019-05-08 MED ORDER — VIIBRYD 40 MG PO TABS: 40.0000 mg | ORAL_TABLET | Freq: Every morning | ORAL | 0 refills | Status: DC

## 2019-05-08 MED ORDER — AMANTADINE HCL 100 MG PO CAPS: 100.0000 mg | ORAL_CAPSULE | Freq: Two times a day (BID) | ORAL | 0 refills | Status: DC

## 2019-05-08 MED ORDER — DIVALPROEX SODIUM 500 MG PO DR TAB: 500.0000 mg | DELAYED_RELEASE_TABLET | Freq: Two times a day (BID) | ORAL | 0 refills | Status: DC

## 2019-05-08 MED ORDER — GABAPENTIN 600 MG PO TABS: 600.0000 mg | ORAL_TABLET | Freq: Three times a day (TID) | ORAL | 0 refills | Status: DC

## 2019-05-08 NOTE — Progress Notes (Signed)
BH MD OP Progress Note  I connected with  Tabitha [name] on 05/08/19 by a video enabled telemedicine application and verified that I am speaking with the correct person using two identifiers.   I discussed the limitations of evaluation and management by telemedicine. The patient expressed understanding and agreed to proceed.    05/08/2019 3:21 PM Tabitha [name]  MRN:  [id number]  Chief Complaint:  " I am doing okay."  HPI: Pt reported that overall she continues to have auditory hallucinations.  She keeps hearing someone calling her name even though it is not.  She stated many times it wakes her up from her sleep.  Sometimes she feels her children are calling her so she would talk to them and asked them if they called her name.  She also stated that she feels insecure at times.  When asked to elaborate she explained that she feels paranoid about someone trying to do something negative towards her.  She has been compliant with her medications.  She has been getting monthly Invega injections regularly.  She received last dose of December 29. Patient was agreeable to increasing the dose of Depakote to 500 mg twice daily for augmentation of Invega to see if that helps with hallucinations and delusions.   Visit Diagnosis:    ICD-10-CM   1. Schizoaffective disorder, depressive type (HCC)  F25.1   2. Obsessive-compulsive disorder, unspecified type  F42.9     Past Psychiatric History: Schizoaffective disorder, OCD  Past Medical History:  Past Medical History:  Diagnosis Date  . Allergic rhinitis   . Anxiety   . Asthma   . Bipolar disorder (HCC)   . Depression   . Diabetes mellitus without complication (HCC)   . GERD (gastroesophageal reflux disease)   . Hyperlipidemia   . Mild sleep apnea   . OCD (obsessive compulsive disorder)   . Palpitations   . Paranoia (HCC)   . Schizoaffective disorder (HCC)   . Sleep apnea     Past Surgical History:  Procedure Laterality Date  . CESAREAN  SECTION  2006/2008  . CHOLECYSTECTOMY  2006    Family Psychiatric History: denied  Family History:  Family History  Problem Relation Age of Onset  . Diabetes Father   . Hyperlipidemia Father   . Diabetes Brother   . Lupus Mother   . Diabetes Mother        pre diabetes    Social History:  Social History   Socioeconomic History  . Marital status: Married    Spouse name: kevin  . Number of children: 2  . Years of education: Not on file  . Highest education level: Bachelor's degree (e.g., BA, AB, BS)  Occupational History  . Not on file  Tobacco Use  . Smoking status: Never Smoker  . Smokeless tobacco: Never Used  Substance and Sexual Activity  . Alcohol use: No    Alcohol/week: 0.0 standard drinks  . Drug use: No  . Sexual activity: Yes    Partners: Male    Birth control/protection: None, Surgical  Other Topics Concern  . Not on file  Social History Narrative  . Not on file   Social Determinants of Health   Financial Resource Strain:   . Difficulty of Paying Living Expenses: Not on file  Food Insecurity:   . Worried About Programme researcher, broadcasting/film/video in the Last Year: Not on file  . Ran Out of Food in the Last Year: Not on file  Transportation Needs:   .  Lack of Transportation (Medical): Not on file  . Lack of Transportation (Non-Medical): Not on file  Physical Activity:   . Days of Exercise per Week: Not on file  . Minutes of Exercise per Session: Not on file  Stress:   . Feeling of Stress : Not on file  Social Connections:   . Frequency of Communication with Friends and Family: Not on file  . Frequency of Social Gatherings with Friends and Family: Not on file  . Attends Religious Services: Not on file  . Active Member of Clubs or Organizations: Not on file  . Attends Banker Meetings: Not on file  . Marital Status: Not on file    Allergies:  Allergies  Allergen Reactions  . Abilify [Aripiprazole] Hives    Metabolic Disorder Labs: Lab  Results  Component Value Date   HGBA1C 6.2 (H) 04/28/2018   Lab Results  Component Value Date   PROLACTIN 84.8 (H) 06/01/2017   PROLACTIN 36.2 (H) 03/26/2016   Lab Results  Component Value Date   CHOL 168 04/28/2018   TRIG 261 (H) 04/28/2018   HDL 53 04/28/2018   CHOLHDL 2.9 09/26/2015   VLDL 51 (H) 04/23/2017   LDLCALC 63 04/28/2018   LDLCALC 51 10/22/2017   Lab Results  Component Value Date   TSH 1.290 04/28/2018   TSH 2.570 06/01/2017    Therapeutic Level Labs: No results found for: LITHIUM Lab Results  Component Value Date   VALPROATE 52 10/22/2017   VALPROATE 43 (L) 06/01/2017   No components found for:  CBMZ  Current Medications: Current Outpatient Medications  Medication Sig Dispense Refill  . albuterol (PROAIR HFA) 108 (90 Base) MCG/ACT inhaler Inhale 2 puffs into the lungs every 6 (six) hours as needed for wheezing or shortness of breath. 8.5 g 6  . amantadine (SYMMETREL) 100 MG capsule Take 1 capsule (100 mg total) by mouth 2 (two) times daily. 180 capsule 1  . atorvastatin (LIPITOR) 20 MG tablet TAKE 1 TABLET(20 MG) BY MOUTH AT BEDTIME 90 tablet 1  . clonazePAM (KLONOPIN) 0.5 MG tablet Take 0.5 tablets (0.25 mg total) by mouth 2 (two) times daily as needed for anxiety. 30 tablet 2  . divalproex (DEPAKOTE) 250 MG DR tablet TAKE 1 TABLET BY MOUTH EVERY MORNING 90 tablet 1  . divalproex (DEPAKOTE) 500 MG DR tablet Take 1 tablet (500 mg total) by mouth every evening. 90 tablet 1  . doxycycline (VIBRAMYCIN) 50 MG capsule TAKE 1 CAPSULE(50 MG) BY MOUTH EVERY MORNING 90 capsule 0  . empagliflozin (JARDIANCE) 25 MG TABS tablet Take 25 mg by mouth daily. 90 tablet 1  . fluticasone (FLOVENT HFA) 110 MCG/ACT inhaler Inhale 1 puff into the lungs 2 (two) times daily. 1 Inhaler 11  . gabapentin (NEURONTIN) 600 MG tablet Take 1 tablet (600 mg total) by mouth 3 (three) times daily. 270 tablet 1  . glucose blood (ACCU-CHEK AVIVA PLUS) test strip 1 each by Other route as  needed for other. Use as instructed 100 each 12  . hydrochlorothiazide (HYDRODIURIL) 12.5 MG tablet Take 1 tab daily 90 tablet 0  . Insulin Detemir (LEVEMIR FLEXTOUCH) 100 UNIT/ML Pen Inject 40 Units into the skin 2 (two) times daily. 15 mL 3  . Insulin Pen Needle (BD PEN NEEDLE NANO U/F) 32G X 4 MM MISC 1 each by Does not apply route daily. 100 each 12  . meloxicam (MOBIC) 15 MG tablet TAKE 1 TABLET(15 MG) BY MOUTH DAILY 30 tablet 0  .  metFORMIN (GLUCOPHAGE) 500 MG tablet Take 2 tablets (1,000 mg total) by mouth 2 (two) times daily with a meal. 360 tablet 1  . NOVOTWIST 32G X 5 MM MISC USE WITH LEVEMIR PEN BID  5  . omeprazole (PRILOSEC) 10 MG capsule TAKE 1 CAPSULE(10 MG) BY MOUTH DAILY 90 capsule 1  . paliperidone (INVEGA SUSTENNA) 234 MG/1.5ML SUSY injection Inject 234 mg into the muscle once for 1 dose. 1.5 mL 11  . pioglitazone (ACTOS) 45 MG tablet Take 1 tablet (45 mg total) by mouth daily. 90 tablet 1  . predniSONE (DELTASONE) 20 MG tablet Take 2 tablets (40 mg total) by mouth daily with breakfast. 10 tablet 0  . Vilazodone HCl (VIIBRYD) 40 MG TABS Take 1 tablet (40 mg total) by mouth every morning. 90 tablet 1   Current Facility-Administered Medications  Medication Dose Route Frequency Provider Last Rate Last Admin  . paliperidone (INVEGA SUSTENNA) injection 234 mg  234 mg Intramuscular Q30 days [place] Crane, MD   234 mg at 04/25/19 1556     Musculoskeletal: Strength & Muscle Tone: unable to assess due to telemed visit Gait & Station: unable to assess due to telemed visit Patient leans: unable to assess due to telemed visit  Psychiatric Specialty Exam: ROS  There were no vitals taken for this visit.There is no height or weight on file to calculate BMI.  General Appearance: Fairly Groomed and Well Groomed  Eye Contact:  Good  Speech:  Clear and Coherent and Normal Rate  Volume:  Normal  Mood:  Euthymic  Affect:  Restricted  Thought Process:  Goal Directed, Linear and  Descriptions of Associations: Intact  Orientation:  Full (Time, Place, and Person)  Thought Content: Hallucinations: Auditory and Paranoid Ideation   Suicidal Thoughts:  No  Homicidal Thoughts:  No  Memory:  Recent;   Good Remote;   Good  Judgement:  Fair  Insight:  Fair  Psychomotor Activity:  Decreased  Concentration:  Concentration: Good and Attention Span: Good  Recall:  Good  Fund of Knowledge: Good  Language: Good  Akathisia:  No  Handed:  Right  AIMS (if indicated): unable to complete due to telemed visit  Assets:  Communication Skills Desire for Improvement Financial Resources/Insurance Housing Social Support  ADL's:  Intact  Cognition: WNL  Sleep:  Good   Screenings: AIMS     Office Visit from 05/12/2017 in [place] Visit from 07/29/2016 in [place] Visit from 05/06/2016 in [place] Visit from 04/01/2016 in [place] Visit from 03/28/2015 in [place] Total Score  0  0  0  0  0    GAD-7     Office Visit from 04/28/2018 in [place]  Total GAD-7 Score  8    PHQ2-9     Clinical Support from 01/19/2019 in [place] Visit from 04/28/2018 in [place] from 01/13/2018 in [place] from 01/13/2017 in [place] Visit from 09/24/2015 in [place]  PHQ-2 Total Score  0  4  2  2  3   PHQ-9 Total Score  --  11  8  12  8        Assessment and Plan: Pt continues to have ongoing auditory hallucinations and paranoid delusions despite compliance with medications.  1. Schizoaffective disorder, depressive type (HCC) - Continue Invega Sustenna 234 mg  qmonthly, next dose due on January 26. - Vilazodone HCl (VIIBRYD) 40 MG TABS; Take 1 tablet (40 mg total) by mouth every  morning.  Dispense: 90 tablet; Refill: 1 - gabapentin (NEURONTIN) 600 MG tablet; Take 1 tablet (600 mg total) by mouth 3 (three) times daily.  Dispense: 270 tablet; Refill: 1 - Increase divalproex (DEPAKOTE) 500 MG DR tablet; Take 1 tablet (500 mg total) by mouth BID.  Dispense: 180 tablet; Refill: 1 - amantadine (SYMMETREL) 100 MG capsule; Take 1 capsule (100 mg total) by mouth 2 (two) times daily.  Dispense: 180 capsule; Refill: 1  2. Obsessive-compulsive disorder, unspecified type  - Vilazodone HCl (VIIBRYD) 40 MG TABS; Take 1 tablet (40 mg total) by mouth every morning.  Dispense: 90 tablet; Refill: 1 - clonazePAM (KLONOPIN) 0.5 MG tablet; Take 0.5 tablets (0.25 mg total) by mouth 2 (two) times daily as needed for anxiety.  Dispense: 30 tablet; Refill: 2  Increase Depakote to 500 mg BID for optimal effect. Continue monthly Invega injections. F/up in 6 weeks.   Zena Amos, MD 05/08/2019, 3:21 PM

## 2019-05-19 ENCOUNTER — Other Ambulatory Visit: Payer: Self-pay | Admitting: Family Medicine

## 2019-05-19 DIAGNOSIS — E119 Type 2 diabetes mellitus without complications: Secondary | ICD-10-CM

## 2019-05-19 NOTE — Telephone Encounter (Signed)
Requested Prescriptions  Pending Prescriptions Disp Refills  . metFORMIN (GLUCOPHAGE) 500 MG tablet [Pharmacy Med Name: METFORMIN '500MG'$  TABLETS] 360 tablet 1    Sig: TAKE 2 TABLETS(1000 MG) BY MOUTH TWICE DAILY WITH A MEAL     Endocrinology:  Diabetes - Biguanides Failed - 05/19/2019 11:02 AM      Failed - Cr in normal range and within 360 days    Creat  Date Value Ref Range Status  09/26/2015 0.73 0.50 - 1.10 mg/dL Final   Creatinine, Ser  Date Value Ref Range Status  04/28/2018 0.79 0.57 - 1.00 mg/dL Final         Failed - HBA1C is between 0 and 7.9 and within 180 days    HB A1C (BAYER DCA - WAIVED)  Date Value Ref Range Status  10/22/2017 6.9 <7.0 % Final    Comment:                                          Diabetic Adult            <7.0                                       Healthy Adult        4.3 - 5.7                                                           (DCCT/NGSP) American Diabetes Association's Summary of Glycemic Recommendations for Adults with Diabetes: Hemoglobin A1c <7.0%. More stringent glycemic goals (A1c <6.0%) may further reduce complications at the cost of increased risk of hypoglycemia.    Hgb A1c MFr Bld  Date Value Ref Range Status  04/28/2018 6.2 (H) 4.8 - 5.6 % Final    Comment:             Prediabetes: 5.7 - 6.4          Diabetes: >6.4          Glycemic control for adults with diabetes: <7.0          Failed - eGFR in normal range and within 360 days    GFR calc Af Amer  Date Value Ref Range Status  04/28/2018 104 >59 mL/min/1.73 Final   GFR calc non Af Amer  Date Value Ref Range Status  04/28/2018 90 >59 mL/min/1.73 Final         Passed - Valid encounter within last 6 months    Recent Outpatient Visits          3 months ago Acute pain of left knee   [facility] [place], [place]   6 months ago Pure hypercholesterolemia   [place], [place], [place]   1 year ago Controlled type 2  diabetes mellitus with diabetic neuropathy, with long-term current use of insulin [facility])   [facility], [place], [place]   1 year ago Essential hypertension   [place], Megan P, DO   1 year ago Controlled type 2 diabetes mellitus without complication, unspecified whether long term insulin use ([place])  [place], [place]             . omeprazole (PRILOSEC) 10 MG capsule [Pharmacy Med Name: OMEPRAZOLE '10MG'$  CAPSULES] 90 capsule 1    Sig: TAKE 1 CAPSULE BY MOUTH DAILY.     Gastroenterology: Proton Pump Inhibitors Passed - 05/19/2019 11:02 AM      Passed - Valid encounter within last 12 months    Recent Outpatient Visits          3 months ago Acute pain of left knee   [facility] [place], [place]   6 months ago Pure hypercholesterolemia   [place], [place], [place]   1 year ago Controlled type 2 diabetes mellitus with diabetic neuropathy, with long-term current use of insulin [facility])   [facility], [place], [place]   1 year ago Essential hypertension   [place], [place] P, DO   1 year ago Controlled type 2 diabetes mellitus without complication, unspecified whether long term insulin use [facility])   [place], [place], [place]

## 2019-05-23 ENCOUNTER — Other Ambulatory Visit: Payer: Self-pay

## 2019-05-23 ENCOUNTER — Ambulatory Visit: Payer: Medicare Other

## 2019-05-25 ENCOUNTER — Other Ambulatory Visit: Payer: Self-pay

## 2019-05-25 ENCOUNTER — Ambulatory Visit (INDEPENDENT_AMBULATORY_CARE_PROVIDER_SITE_OTHER): Payer: Medicare Other

## 2019-05-25 DIAGNOSIS — F429 Obsessive-compulsive disorder, unspecified: Secondary | ICD-10-CM

## 2019-05-25 DIAGNOSIS — F251 Schizoaffective disorder, depressive type: Secondary | ICD-10-CM

## 2019-05-25 DIAGNOSIS — F3170 Bipolar disorder, currently in remission, most recent episode unspecified: Secondary | ICD-10-CM | POA: Diagnosis not present

## 2019-05-25 NOTE — Progress Notes (Signed)
Patient tolerated due injection without any complaint of pain or discomfort and agreed to return in 30 days for next due injection.  invega sustenna 234mg was given today right upper outer gluteal area.Exp. Date08-2022 lot #[id number]# [id number]  GTIN: [id number]  NDC# [id number] 

## 2019-05-25 NOTE — Patient Instructions (Signed)
Patient tolerated due injection without any complaint of pain or discomfort and agreed to return in 30 days for next due injection.  invega sustenna 234mg  was given today right upper outer gluteal area.Exp. lot #[id number]# [id number]  GTIN: [id number]  NDC# [id number]

## 2019-06-02 ENCOUNTER — Other Ambulatory Visit: Payer: Self-pay | Admitting: Family Medicine

## 2019-06-02 DIAGNOSIS — I10 Essential (primary) hypertension: Secondary | ICD-10-CM

## 2019-06-02 DIAGNOSIS — L719 Rosacea, unspecified: Secondary | ICD-10-CM

## 2019-06-02 NOTE — Telephone Encounter (Signed)
LOV: 02/16/2019 with Fleet [address]ras lane, PA-C. No future visit scheduled.

## 2019-06-02 NOTE — Telephone Encounter (Signed)
Requested Prescriptions  Pending Prescriptions Disp Refills  . doxycycline (VIBRAMYCIN) 50 MG capsule [Pharmacy Med Name: DOXYCYCLINE HYC 50MG  CAPS] 90 capsule 0    Sig: TAKE 1 CAPSULE(50 MG) BY MOUTH EVERY MORNING     Off-Protocol Failed - 06/02/2019  9:59 AM      Failed - Medication not assigned to a protocol, review manually.      Passed - Valid encounter within last 12 months    Recent Outpatient Visits          3 months ago Acute pain of left knee   [facility] [place], [place]   6 months ago Pure hypercholesterolemia   [facility] [place], [place]   1 year ago Controlled type 2 diabetes mellitus with diabetic neuropathy, with long-term current use of insulin [place])   [facility] [place], [place], [place]   1 year ago Essential hypertension   [facility] [place], Megan P, [place]   1 year ago Controlled type 2 diabetes mellitus without complication, unspecified whether long term insulin use (HCC)   [facility], [place], [place]             . atorvastatin (LIPITOR) 20 MG tablet [Pharmacy Med Name: ATORVASTATIN 20MG  TABLETS] 90 tablet 1    Sig: TAKE 1 TABLET(20 MG) BY MOUTH AT BEDTIME     Cardiovascular:  Antilipid - Statins Failed - 06/02/2019  9:59 AM      Failed - Total Cholesterol in normal range and within 360 days    Cholesterol, Total  Date Value Ref Range Status  04/28/2018 168 100 - 199 mg/dL Final   Cholesterol Piccolo, Waived  Date Value Ref Range Status  04/23/2017 176 <200 mg/dL Final    Comment:                            Desirable                <200                         Borderline High      200- 239                         High                     >239          Failed - LDL in normal range and within 360 days    LDL Calculated  Date Value Ref Range Status  04/28/2018 63 0 - 99 mg/dL Final         Failed - HDL in normal range and within 360 days   HDL  Date Value Ref Range Status  04/28/2018 53 >39 mg/dL Final         Failed - Triglycerides in normal range and within 360 days    Triglycerides  Date Value Ref Range Status  04/28/2018 261 (H) 0 - 149 mg/dL Final   Triglycerides Piccolo,Waived  Date Value Ref Range Status  04/23/2017 253 (H) <150 mg/dL Final    Comment:                            Normal                   <  150                         Borderline High     150 - 199                         High                200 - 499                         Very High                >499          Passed - Patient is not pregnant      Passed - Valid encounter within last 12 months    Recent Outpatient Visits          3 months ago Acute pain of left knee   [facility] [place], [place]   6 months ago Pure hypercholesterolemia   [place], [place], [place]   1 year ago Controlled type 2 diabetes mellitus with diabetic neuropathy, with long-term current use of insulin [facility])   [facility], [place], [place]   1 year ago Essential hypertension   [place], [place] P, DO   1 year ago Controlled type 2 diabetes mellitus without complication, unspecified whether long term insulin use [facility])   [place], [place], [place]

## 2019-06-02 NOTE — Telephone Encounter (Signed)
Requested medication (s) are due for refill today: yes  Requested medication (s) are on the active medication list: yes  Last refill:  01/11/2019  Future visit scheduled:no  Notes to clinic: no assigned protocol    Requested Prescriptions  Pending Prescriptions Disp Refills   doxycycline (VIBRAMYCIN) 50 MG capsule [Pharmacy Med Name: DOXYCYCLINE HYC 50MG  CAPS] 90 capsule 0    Sig: TAKE 1 CAPSULE(50 MG) BY MOUTH EVERY MORNING      Off-Protocol Failed - 06/02/2019  9:59 AM      Failed - Medication not assigned to a protocol, review manually.      Passed - Valid encounter within last 12 months    Recent Outpatient Visits           3 months ago Acute pain of left knee   [facility] [place] Roof [place], [place]   6 months ago Pure hypercholesterolemia   [facility] [place] Roof [place], [place]   1 year ago Controlled type 2 diabetes mellitus with diabetic neuropathy, with long-term current use of insulin [facility])   [place], [place], [place]   1 year ago Essential hypertension   [place], Megan P, DO   1 year ago Controlled type 2 diabetes mellitus without complication, unspecified whether long term insulin use ([place])   [place], [place], [place]               Signed Prescriptions Disp Refills   atorvastatin (LIPITOR) 20 MG tablet 90 tablet 1    Sig: TAKE 1 TABLET(20 MG) BY MOUTH AT BEDTIME      Cardiovascular:  Antilipid - Statins Failed - 06/02/2019  9:59 AM      Failed - Total Cholesterol in normal range and within 360 days    Cholesterol, Total  Date Value Ref Range Status  04/28/2018 168 100 - 199 mg/dL Final   Cholesterol Piccolo, Waived  Date Value Ref Range Status  04/23/2017 176 <200 mg/dL Final    Comment:                            Desirable                <200                         Borderline High      200- 239                         High                      >239           Failed - LDL in normal range and within 360 days    LDL Calculated  Date Value Ref Range Status  04/28/2018 63 0 - 99 mg/dL Final          Failed - HDL in normal range and within 360 days    HDL  Date Value Ref Range Status  04/28/2018 53 >39 mg/dL Final          Failed - Triglycerides in normal range and within 360 days    Triglycerides  Date Value Ref Range Status  04/28/2018 261 (H) 0 - 149 mg/dL Final   Triglycerides Piccolo,Waived  Date Value Ref Range Status  04/23/2017 253 (H) <150  mg/dL Final    Comment:                            Normal                   <150                         Borderline High     150 - 199                         High                200 - 499                         Very High                >499           Passed - Patient is not pregnant      Passed - Valid encounter within last 12 months    Recent Outpatient Visits           3 months ago Acute pain of left knee   [facility] [place], [place]   6 months ago Pure hypercholesterolemia   [facility] [place], [place], [place]   1 year ago Controlled type 2 diabetes mellitus with diabetic neuropathy, with long-term current use of insulin [facility])   [facility], [place], [place]   1 year ago Essential hypertension   [facility] [place], Megan P, DO   1 year ago Controlled type 2 diabetes mellitus without complication, unspecified whether long term insulin use [facility])   [facility] [place], [place], [place]

## 2019-06-14 ENCOUNTER — Other Ambulatory Visit: Payer: Self-pay | Admitting: Family Medicine

## 2019-06-14 NOTE — Telephone Encounter (Signed)
Requested Prescriptions  Pending Prescriptions Disp Refills  . JARDIANCE 25 MG TABS tablet [Pharmacy Med Name: JARDIANCE 25MG TABLETS] 90 tablet 1    Sig: TAKE 1 TABLET BY MOUTH DAILY     Endocrinology:  Diabetes - SGLT2 Inhibitors Failed - 06/14/2019 11:28 AM      Failed - Cr in normal range and within 360 days    Creat  Date Value Ref Range Status  09/26/2015 0.73 0.50 - 1.10 mg/dL Final   Creatinine, Ser  Date Value Ref Range Status  04/28/2018 0.79 0.57 - 1.00 mg/dL Final         Failed - LDL in normal range and within 360 days    LDL Calculated  Date Value Ref Range Status  04/28/2018 63 0 - 99 mg/dL Final         Failed - HBA1C is between 0 and 7.9 and within 180 days    HB A1C (BAYER DCA - WAIVED)  Date Value Ref Range Status  10/22/2017 6.9 <7.0 % Final    Comment:                                          Diabetic Adult            <7.0                                       Healthy Adult        4.3 - 5.7                                                           (DCCT/NGSP) American Diabetes Association's Summary of Glycemic Recommendations for Adults with Diabetes: Hemoglobin A1c <7.0%. More stringent glycemic goals (A1c <6.0%) may further reduce complications at the cost of increased risk of hypoglycemia.    Hgb A1c MFr Bld  Date Value Ref Range Status  04/28/2018 6.2 (H) 4.8 - 5.6 % Final    Comment:             Prediabetes: 5.7 - 6.4          Diabetes: >6.4          Glycemic control for adults with diabetes: <7.0          Failed - eGFR in normal range and within 360 days    GFR calc Af Amer  Date Value Ref Range Status  04/28/2018 104 >59 mL/min/1.73 Final   GFR calc non Af Amer  Date Value Ref Range Status  04/28/2018 90 >59 mL/min/1.73 Final         Passed - Valid encounter within last 6 months    Recent Outpatient Visits          3 months ago Acute pain of left knee   [facility] [place], [place]   7 months ago Pure  hypercholesterolemia   [place], [place], [place]   1 year ago Controlled type 2 diabetes mellitus with diabetic neuropathy, with long-term current use of insulin [facility])   [place], [place], [place]   1  year ago Essential hypertension   [place], [place] P, DO   1 year ago Controlled type 2 diabetes mellitus without complication, unspecified whether long term insulin use [facility])   [facility] Roof [place], [place]

## 2019-06-20 ENCOUNTER — Other Ambulatory Visit: Payer: Self-pay | Admitting: Family Medicine

## 2019-06-20 DIAGNOSIS — Z794 Long term (current) use of insulin: Secondary | ICD-10-CM

## 2019-06-20 DIAGNOSIS — E119 Type 2 diabetes mellitus without complications: Secondary | ICD-10-CM

## 2019-06-22 ENCOUNTER — Other Ambulatory Visit: Payer: Self-pay

## 2019-06-22 ENCOUNTER — Ambulatory Visit: Payer: Medicare Other

## 2019-06-22 ENCOUNTER — Encounter: Payer: Self-pay | Admitting: Psychiatry

## 2019-06-22 ENCOUNTER — Ambulatory Visit (INDEPENDENT_AMBULATORY_CARE_PROVIDER_SITE_OTHER): Payer: Medicare Other | Admitting: Psychiatry

## 2019-06-22 DIAGNOSIS — F251 Schizoaffective disorder, depressive type: Secondary | ICD-10-CM

## 2019-06-22 DIAGNOSIS — F429 Obsessive-compulsive disorder, unspecified: Secondary | ICD-10-CM | POA: Diagnosis not present

## 2019-06-22 MED ORDER — AMANTADINE HCL 100 MG PO CAPS: 100.0000 mg | ORAL_CAPSULE | Freq: Two times a day (BID) | ORAL | 0 refills | Status: DC

## 2019-06-22 MED ORDER — CLONAZEPAM 0.5 MG PO TABS: 0.2500 mg | ORAL_TABLET | Freq: Two times a day (BID) | ORAL | 2 refills | Status: DC | PRN

## 2019-06-22 MED ORDER — DIVALPROEX SODIUM 500 MG PO DR TAB: 500.0000 mg | DELAYED_RELEASE_TABLET | Freq: Two times a day (BID) | ORAL | 0 refills | Status: DC

## 2019-06-22 MED ORDER — VIIBRYD 40 MG PO TABS: 40.0000 mg | ORAL_TABLET | Freq: Every morning | ORAL | 0 refills | Status: DC

## 2019-06-22 MED ORDER — GABAPENTIN 600 MG PO TABS: 600.0000 mg | ORAL_TABLET | Freq: Three times a day (TID) | ORAL | 0 refills | Status: DC

## 2019-06-22 NOTE — Progress Notes (Signed)
BH MD OP Progress Note  I connected with  Tabitha [name] on 06/22/19 by a video enabled telemedicine application and verified that I am speaking with the correct person using two identifiers.   I discussed the limitations of evaluation and management by telemedicine. The patient expressed understanding and agreed to proceed.    06/22/2019 11:11 AM Tabitha [name]  MRN:  [id number]  Chief Complaint:  " I have started seeing the maggots again."  HPI: Pt reported that the visual hallucinations of seeing maggots has returned back.  She informed that the past few days she started seeing maggots and when she goes to her kitchen to cook something on the stove she sees them there.  However her family is informed her numerous times that there is nothing. She still hears someone calling her name but it is less frequent now. Patient was asked if she would like to add another antipsychotic for augmentation to her monthly IM long-acting injectable antipsychotic or if she would rather monitor her symptoms for now.  Patient stated that she can continue to monitor her symptoms as things are still manageable and she is able to take care of her daily routine. She informed that she is getting around 6 to 7 hours of sleep every night and she feels rested when she wakes up in the morning. She is scheduled to get her next IM Invega injection today. She informed that she and her family are going to visit her brother in [place] in April during the spring break.   Visit Diagnosis:    ICD-10-CM   1. Schizoaffective disorder, depressive type (HCC)  F25.1   2. Obsessive-compulsive disorder, unspecified type  F42.9     Past Psychiatric History: Schizoaffective disorder, OCD  Past Medical History:  Past Medical History:  Diagnosis Date  . Allergic rhinitis   . Anxiety   . Asthma   . Bipolar disorder (HCC)   . Depression   . Diabetes mellitus without complication (HCC)   . GERD (gastroesophageal reflux  disease)   . Hyperlipidemia   . Mild sleep apnea   . OCD (obsessive compulsive disorder)   . Palpitations   . Paranoia (HCC)   . Schizoaffective disorder (HCC)   . Sleep apnea     Past Surgical History:  Procedure Laterality Date  . CESAREAN SECTION  2006/2008  . CHOLECYSTECTOMY  2006    Family Psychiatric History: denied  Family History:  Family History  Problem Relation Age of Onset  . Diabetes Father   . Hyperlipidemia Father   . Diabetes Brother   . Lupus Mother   . Diabetes Mother        pre diabetes    Social History:  Social History   Socioeconomic History  . Marital status: Married    Spouse name: kevin  . Number of children: 2  . Years of education: Not on file  . Highest education level: Bachelor's degree (e.g., BA, AB, BS)  Occupational History  . Not on file  Tobacco Use  . Smoking status: Never Smoker  . Smokeless tobacco: Never Used  Substance and Sexual Activity  . Alcohol use: No    Alcohol/week: 0.0 standard drinks  . Drug use: No  . Sexual activity: Yes    Partners: Male    Birth control/protection: None, Surgical  Other Topics Concern  . Not on file  Social History Narrative  . Not on file   Social Determinants of Health   Financial Resource Strain:   .  Difficulty of Paying Living Expenses: Not on file  Food Insecurity:   . Worried About Programme researcher, broadcasting/film/video in the Last Year: Not on file  . Ran Out of Food in the Last Year: Not on file  Transportation Needs:   . Lack of Transportation (Medical): Not on file  . Lack of Transportation (Non-Medical): Not on file  Physical Activity:   . Days of Exercise per Week: Not on file  . Minutes of Exercise per Session: Not on file  Stress:   . Feeling of Stress : Not on file  Social Connections:   . Frequency of Communication with Friends and Family: Not on file  . Frequency of Social Gatherings with Friends and Family: Not on file  . Attends Religious Services: Not on file  . Active  Member of Clubs or Organizations: Not on file  . Attends Banker Meetings: Not on file  . Marital Status: Not on file    Allergies:  Allergies  Allergen Reactions  . Abilify [Aripiprazole] Hives    Metabolic Disorder Labs: Lab Results  Component Value Date   HGBA1C 6.2 (H) 04/28/2018   Lab Results  Component Value Date   PROLACTIN 84.8 (H) 06/01/2017   PROLACTIN 36.2 (H) 03/26/2016   Lab Results  Component Value Date   CHOL 168 04/28/2018   TRIG 261 (H) 04/28/2018   HDL 53 04/28/2018   CHOLHDL 2.9 09/26/2015   VLDL 51 (H) 04/23/2017   LDLCALC 63 04/28/2018   LDLCALC 51 10/22/2017   Lab Results  Component Value Date   TSH 1.290 04/28/2018   TSH 2.570 06/01/2017    Therapeutic Level Labs: No results found for: LITHIUM Lab Results  Component Value Date   VALPROATE 52 10/22/2017   VALPROATE 43 (L) 06/01/2017   No components found for:  CBMZ  Current Medications: Current Outpatient Medications  Medication Sig Dispense Refill  . doxycycline (VIBRAMYCIN) 50 MG capsule TAKE 1 CAPSULE(50 MG) BY MOUTH EVERY MORNING 90 capsule 0  . albuterol (PROAIR HFA) 108 (90 Base) MCG/ACT inhaler Inhale 2 puffs into the lungs every 6 (six) hours as needed for wheezing or shortness of breath. 8.5 g 6  . amantadine (SYMMETREL) 100 MG capsule Take 1 capsule (100 mg total) by mouth 2 (two) times daily. 180 capsule 0  . atorvastatin (LIPITOR) 20 MG tablet TAKE 1 TABLET(20 MG) BY MOUTH AT BEDTIME 90 tablet 1  . clonazePAM (KLONOPIN) 0.5 MG tablet Take 0.5 tablets (0.25 mg total) by mouth 2 (two) times daily as needed for anxiety. 30 tablet 2  . divalproex (DEPAKOTE) 500 MG DR tablet Take 1 tablet (500 mg total) by mouth 2 (two) times daily. 180 tablet 0  . fluticasone (FLOVENT HFA) 110 MCG/ACT inhaler Inhale 1 puff into the lungs 2 (two) times daily. 1 Inhaler 11  . gabapentin (NEURONTIN) 600 MG tablet Take 1 tablet (600 mg total) by mouth 3 (three) times daily. 270 tablet 0   . glucose blood (ACCU-CHEK AVIVA PLUS) test strip 1 each by Other route as needed for other. Use as instructed 100 each 12  . hydrochlorothiazide (HYDRODIURIL) 12.5 MG tablet Take 1 tab daily 90 tablet 0  . Insulin Detemir (LEVEMIR FLEXTOUCH) 100 UNIT/ML Pen Inject 40 Units into the skin 2 (two) times daily. 15 mL 3  . Insulin Pen Needle (BD PEN NEEDLE NANO U/F) 32G X 4 MM MISC 1 each by Does not apply route daily. 100 each 12  . JARDIANCE 25 MG TABS  tablet TAKE 1 TABLET BY MOUTH DAILY 90 tablet 1  . meloxicam (MOBIC) 15 MG tablet TAKE 1 TABLET(15 MG) BY MOUTH DAILY 30 tablet 0  . metFORMIN (GLUCOPHAGE) 500 MG tablet TAKE 2 TABLETS(1000 MG) BY MOUTH TWICE DAILY WITH A MEAL 360 tablet 1  . NOVOTWIST 32G X 5 MM MISC USE WITH LEVEMIR PEN BID  5  . omeprazole (PRILOSEC) 10 MG capsule TAKE 1 CAPSULE BY MOUTH DAILY. 90 capsule 1  . paliperidone (INVEGA SUSTENNA) 234 MG/1.5ML SUSY injection Inject 234 mg into the muscle once for 1 dose. 1.5 mL 11  . pioglitazone (ACTOS) 45 MG tablet TAKE 1 TABLET(45 MG) BY MOUTH DAILY 90 tablet 0  . predniSONE (DELTASONE) 20 MG tablet Take 2 tablets (40 mg total) by mouth daily with breakfast. 10 tablet 0  . Vilazodone HCl (VIIBRYD) 40 MG TABS Take 1 tablet (40 mg total) by mouth every morning. 90 tablet 0   Current Facility-Administered Medications  Medication Dose Route Frequency Provider Last Rate Last Admin  . paliperidone (INVEGA SUSTENNA) injection 234 mg  234 mg Intramuscular Q30 days [place] Crane, MD   234 mg at 05/25/19 1633     Musculoskeletal: Strength & Muscle Tone: unable to assess due to telemed visit Gait & Station: unable to assess due to telemed visit Patient leans: unable to assess due to telemed visit  Psychiatric Specialty Exam: ROS  There were no vitals taken for this visit.There is no height or weight on file to calculate BMI.  General Appearance: Well Groomed  Eye Contact:  Good  Speech:  Clear and Coherent and Normal Rate  Volume:   Normal  Mood:  Euthymic  Affect:  Restricted  Thought Process:  Goal Directed, Linear and Descriptions of Associations: Intact  Orientation:  Full (Time, Place, and Person)  Thought Content: Hallucinations: Auditory Visual and Paranoid Ideation   Suicidal Thoughts:  No  Homicidal Thoughts:  No  Memory:  Recent;   Good Remote;   Good  Judgement:  Fair  Insight:  Fair  Psychomotor Activity:  Decreased  Concentration:  Concentration: Good and Attention Span: Good  Recall:  Good  Fund of Knowledge: Good  Language: Good  Akathisia:  No  Handed:  Right  AIMS (if indicated): unable to complete due to telemed visit  Assets:  Communication Skills Desire for Improvement Financial Resources/Insurance Housing Social Support  ADL's:  Intact  Cognition: WNL  Sleep:  Good   Screenings: AIMS     Office Visit from 05/12/2017 in [facility] Visit from 07/29/2016 in [facility] Visit from 05/06/2016 in [facility] Visit from 04/01/2016 in [facility] Visit from 03/28/2015 in [facility] Total Score  0  0  0  0  0    GAD-7     Office Visit from 04/28/2018 in [facility]  Total GAD-7 Score  8    PHQ2-9     Clinical Support from 01/19/2019 in [facility] Visit from 04/28/2018 in [facility] from 01/13/2018 in [facility] from 01/13/2017 in [facility] Visit from 09/24/2015 in [facility]  PHQ-2 Total Score  0  4  2  2  3   PHQ-9 Total Score  --  11  8  12  8        Assessment and Plan: Pt reported recurrence of visual hallucinations and  ongoing auditory hallucinations  which appear to be her baseline.  Patient was offered the option of adding another antipsychotic to her regimen or the option of continue  to monitor symptoms as long as they do not impact her daily routine.  Patient chose to monitor her symptoms for now.  1. Schizoaffective disorder, depressive type (HCC) - Continue Invega Sustenna 234 mg qmonthly, scheduled for next dose this afternoon. - Vilazodone HCl (VIIBRYD) 40 MG TABS; Take 1 tablet (40 mg total) by mouth every morning.  Dispense: 90 tablet; Refill: 1 - gabapentin (NEURONTIN) 600 MG tablet; Take 1 tablet (600 mg total) by mouth 3 (three) times daily.  Dispense: 270 tablet; Refill: 1 - Continue divalproex (DEPAKOTE) 500 MG DR tablet; Take 1 tablet (500 mg total) by mouth BID.  Dispense: 180 tablet; Refill: 1 - amantadine (SYMMETREL) 100 MG capsule; Take 1 capsule (100 mg total) by mouth 2 (two) times daily.  Dispense: 180 capsule; Refill: 1  2. Obsessive-compulsive disorder, unspecified type  - Vilazodone HCl (VIIBRYD) 40 MG TABS; Take 1 tablet (40 mg total) by mouth every morning.  Dispense: 90 tablet; Refill: 1 - clonazePAM (KLONOPIN) 0.5 MG tablet; Take 0.5 tablets (0.25 mg total) by mouth 2 (two) times daily as needed for anxiety.  Dispense: 30 tablet; Refill: 2   Continue monthly IM Invega injections. Continue same regimen for now. F/up in 6 weeks.   Zena Amos, MD 06/22/2019, 11:11 AM

## 2019-06-27 ENCOUNTER — Ambulatory Visit (INDEPENDENT_AMBULATORY_CARE_PROVIDER_SITE_OTHER): Payer: Medicare Other

## 2019-06-27 ENCOUNTER — Other Ambulatory Visit: Payer: Self-pay

## 2019-06-27 DIAGNOSIS — F251 Schizoaffective disorder, depressive type: Secondary | ICD-10-CM

## 2019-06-27 NOTE — Progress Notes (Signed)
Patient presented with flat affect and in a good mood. Patient tolerated due injection without any complaint of pain or discomfort and agreed to return in 30 days for next due injection.

## 2019-07-24 ENCOUNTER — Other Ambulatory Visit: Payer: Self-pay | Admitting: Family Medicine

## 2019-07-24 NOTE — Telephone Encounter (Signed)
LOV 02/16/2019

## 2019-07-24 NOTE — Telephone Encounter (Signed)
Requested medication (s) are due for refill today: yes  Requested medication (s) are on the active medication list: yes  Last refill:  04/17/19  Future visit scheduled: no  Notes to clinic:  Cardiovascular: Diuretics - Thiazide failed  Requested Prescriptions  Pending Prescriptions Disp Refills   hydrochlorothiazide (HYDRODIURIL) 12.5 MG tablet [Pharmacy Med Name: HYDROCHLOROTHIAZIDE 12.5MG  TABLETS] 90 tablet 0    Sig: TAKE 1 TABLET BY MOUTH DAILY      Cardiovascular: Diuretics - Thiazide Failed - 07/24/2019 11:20 AM      Failed - Ca in normal range and within 360 days    Calcium  Date Value Ref Range Status  04/28/2018 10.1 8.7 - 10.2 mg/dL Final          Failed - Cr in normal range and within 360 days    Creat  Date Value Ref Range Status  09/26/2015 0.73 0.50 - 1.10 mg/dL Final   Creatinine, Ser  Date Value Ref Range Status  04/28/2018 0.79 0.57 - 1.00 mg/dL Final          Failed - K in normal range and within 360 days    Potassium  Date Value Ref Range Status  04/28/2018 4.0 3.5 - 5.2 mmol/L Final          Failed - Na in normal range and within 360 days    Sodium  Date Value Ref Range Status  04/28/2018 139 134 - 144 mmol/L Final          Passed - Last BP in normal range    BP Readings from Last 1 Encounters:  02/16/19 (!) 143/74          Passed - Valid encounter within last 6 months    Recent Outpatient Visits           5 months ago Acute pain of left knee   [facility] [place], [place]   8 months ago Pure hypercholesterolemia   [facility] [place], [place], [place]   1 year ago Controlled type 2 diabetes mellitus with diabetic neuropathy, with long-term current use of insulin [facility])   [facility] [place], [place], [place]   1 year ago Essential hypertension   [facility] [place], Megan P, DO   2 years ago Controlled type 2 diabetes mellitus without complication, unspecified  whether long term insulin use [facility])   [facility] [place], [place], [place]

## 2019-07-25 ENCOUNTER — Ambulatory Visit: Payer: Medicare Other

## 2019-07-27 ENCOUNTER — Ambulatory Visit (INDEPENDENT_AMBULATORY_CARE_PROVIDER_SITE_OTHER): Payer: Medicare Other

## 2019-07-27 ENCOUNTER — Other Ambulatory Visit: Payer: Self-pay

## 2019-07-27 DIAGNOSIS — F251 Schizoaffective disorder, depressive type: Secondary | ICD-10-CM | POA: Diagnosis not present

## 2019-07-27 NOTE — Progress Notes (Signed)
Patient presented with flat affect and in a good mood. She stated that she's going through some stuff but despite that she presented a very pleasant demeanor.  Patient tolerated due injection of [place] 234mg  without any complaint of pain or discomfort given in her right upper outer quadrant. Patient agreed to return in 30 days for next due injection.

## 2019-08-03 ENCOUNTER — Other Ambulatory Visit: Payer: Self-pay | Admitting: Family Medicine

## 2019-08-03 DIAGNOSIS — E0800 Diabetes mellitus due to underlying condition with hyperosmolarity without nonketotic hyperglycemic-hyperosmolar coma (NKHHC): Secondary | ICD-10-CM

## 2019-08-03 DIAGNOSIS — E119 Type 2 diabetes mellitus without complications: Secondary | ICD-10-CM

## 2019-08-03 DIAGNOSIS — Z794 Long term (current) use of insulin: Secondary | ICD-10-CM

## 2019-08-10 ENCOUNTER — Ambulatory Visit: Payer: Medicare Other | Admitting: Psychiatry

## 2019-08-16 ENCOUNTER — Telehealth (INDEPENDENT_AMBULATORY_CARE_PROVIDER_SITE_OTHER): Payer: Medicare Other | Admitting: Psychiatry

## 2019-08-16 ENCOUNTER — Encounter: Payer: Self-pay | Admitting: Psychiatry

## 2019-08-16 ENCOUNTER — Other Ambulatory Visit: Payer: Self-pay

## 2019-08-16 DIAGNOSIS — F251 Schizoaffective disorder, depressive type: Secondary | ICD-10-CM

## 2019-08-16 DIAGNOSIS — F429 Obsessive-compulsive disorder, unspecified: Secondary | ICD-10-CM | POA: Diagnosis not present

## 2019-08-16 MED ORDER — DIVALPROEX SODIUM 500 MG PO DR TAB: 500.0000 mg | DELAYED_RELEASE_TABLET | Freq: Two times a day (BID) | ORAL | 0 refills | Status: DC

## 2019-08-16 MED ORDER — VIIBRYD 40 MG PO TABS: 40.0000 mg | ORAL_TABLET | Freq: Every morning | ORAL | 0 refills | Status: DC

## 2019-08-16 MED ORDER — AMANTADINE HCL 100 MG PO CAPS: 100.0000 mg | ORAL_CAPSULE | Freq: Two times a day (BID) | ORAL | 0 refills | Status: DC

## 2019-08-16 MED ORDER — CLONAZEPAM 0.5 MG PO TABS: 0.2500 mg | ORAL_TABLET | Freq: Two times a day (BID) | ORAL | 2 refills | Status: DC | PRN

## 2019-08-16 MED ORDER — PERPHENAZINE 4 MG PO TABS: ORAL_TABLET | ORAL | 0 refills | Status: DC

## 2019-08-16 NOTE — Progress Notes (Signed)
[place] MD OP Progress Note  I connected with  Tabitha [name] on 08/16/19 by a video enabled telemedicine application and verified that I am speaking with the correct person using two identifiers.   I discussed the limitations of evaluation and management by telemedicine. The patient expressed understanding and agreed to proceed.    08/16/2019 2:17 PM Tabitha [name]  MRN:  [id number]  Chief Complaint:  " I am still hearing voices and seeing things."  HPI: Pt reported that she still sees maggots and hears voices calling her name. She tries to ignore this but sometimes it is hard for her to ignore. She reported that she is wondering if another medication can be added to her regimen to help with this.  She mentioned some ongoing stress with her family but did not elaborate any further. She stated that other than these hallucinations she has been doing well.  She reported she is able to sleep well for the most part.  She mentioned that she had a good trip to [place] to celebrate her brother's 50th birthday. Patient was offered addition of medium potency typical antipsychotic perphenazine to her current regimen of Invega and other medications.  Patient was agreeable to try it.  Visit Diagnosis:    ICD-10-CM   1. Schizoaffective disorder, depressive type ([place])  F25.1   2. Obsessive-compulsive disorder, unspecified type  F42.9     Past Psychiatric History: Schizoaffective disorder, OCD  Past Medical History:  Past Medical History:  Diagnosis Date  . Allergic rhinitis   . Anxiety   . Asthma   . Bipolar disorder ([place])   . Depression   . Diabetes mellitus without complication ([place])   . GERD (gastroesophageal reflux disease)   . Hyperlipidemia   . Mild sleep apnea   . OCD (obsessive compulsive disorder)   . Palpitations   . Paranoia ([place])   . Schizoaffective disorder ([place])   . Sleep apnea     Past Surgical History:  Procedure Laterality Date  . CESAREAN SECTION  2006/2008  .  CHOLECYSTECTOMY  2006    Family Psychiatric History: denied  Family History:  Family History  Problem Relation Age of Onset  . Diabetes Father   . Hyperlipidemia Father   . Diabetes Brother   . Lupus Mother   . Diabetes Mother        pre diabetes    Social History:  Social History   Socioeconomic History  . Marital status: Married    Spouse name: kevin  . Number of children: 2  . Years of education: Not on file  . Highest education level: Bachelor's degree (e.g., BA, AB, BS)  Occupational History  . Not on file  Tobacco Use  . Smoking status: Never Smoker  . Smokeless tobacco: Never Used  Substance and Sexual Activity  . Alcohol use: No    Alcohol/week: 0.0 standard drinks  . Drug use: No  . Sexual activity: Yes    Partners: Male    Birth control/protection: None, Surgical  Other Topics Concern  . Not on file  Social History Narrative  . Not on file   Social Determinants of Health   Financial Resource Strain:   . Difficulty of Paying Living Expenses:   Food Insecurity:   . Worried About Charity fundraiser in the Last Year:   . Arboriculturist in the Last Year:   Transportation Needs:   . Film/video editor (Medical):   Marland Kitchen Lack of Transportation (Non-Medical):  Physical Activity:   . Days of Exercise per Week:   . Minutes of Exercise per Session:   Stress:   . Feeling of Stress :   Social Connections:   . Frequency of Communication with Friends and Family:   . Frequency of Social Gatherings with Friends and Family:   . Attends Religious Services:   . Active Member of Clubs or Organizations:   . Attends Banker Meetings:   Marland Kitchen Marital Status:     Allergies:  Allergies  Allergen Reactions  . Abilify [Aripiprazole] Hives    Metabolic Disorder Labs: Lab Results  Component Value Date   HGBA1C 6.2 (H) 04/28/2018   Lab Results  Component Value Date   PROLACTIN 84.8 (H) 06/01/2017   PROLACTIN 36.2 (H) 03/26/2016   Lab Results   Component Value Date   CHOL 168 04/28/2018   TRIG 261 (H) 04/28/2018   HDL 53 04/28/2018   CHOLHDL 2.9 09/26/2015   VLDL 51 (H) 04/23/2017   LDLCALC 63 04/28/2018   LDLCALC 51 10/22/2017   Lab Results  Component Value Date   TSH 1.290 04/28/2018   TSH 2.570 06/01/2017    Therapeutic Level Labs: No results found for: LITHIUM Lab Results  Component Value Date   VALPROATE 52 10/22/2017   VALPROATE 43 (L) 06/01/2017   No components found for:  CBMZ  Current Medications: Current Outpatient Medications  Medication Sig Dispense Refill  . doxycycline (VIBRAMYCIN) 50 MG capsule TAKE 1 CAPSULE(50 MG) BY MOUTH EVERY MORNING 90 capsule 0  . albuterol (PROAIR HFA) 108 (90 Base) MCG/ACT inhaler Inhale 2 puffs into the lungs every 6 (six) hours as needed for wheezing or shortness of breath. 8.5 g 6  . amantadine (SYMMETREL) 100 MG capsule Take 1 capsule (100 mg total) by mouth 2 (two) times daily. 180 capsule 0  . atorvastatin (LIPITOR) 20 MG tablet TAKE 1 TABLET(20 MG) BY MOUTH AT BEDTIME 90 tablet 1  . clonazePAM (KLONOPIN) 0.5 MG tablet Take 0.5 tablets (0.25 mg total) by mouth 2 (two) times daily as needed for anxiety. 30 tablet 2  . divalproex (DEPAKOTE) 500 MG DR tablet Take 1 tablet (500 mg total) by mouth 2 (two) times daily. 180 tablet 0  . fluticasone (FLOVENT HFA) 110 MCG/ACT inhaler Inhale 1 puff into the lungs 2 (two) times daily. 1 Inhaler 11  . gabapentin (NEURONTIN) 600 MG tablet Take 1 tablet (600 mg total) by mouth 3 (three) times daily. 270 tablet 0  . glucose blood (ACCU-CHEK AVIVA PLUS) test strip 1 each by Other route as needed for other. Use as instructed 100 each 12  . hydrochlorothiazide (HYDRODIURIL) 12.5 MG tablet TAKE 1 TABLET BY MOUTH DAILY 30 tablet 0  . Insulin Pen Needle (BD PEN NEEDLE NANO U/F) 32G X 4 MM MISC 1 each by Does not apply route daily. 100 each 12  . JARDIANCE 25 MG TABS tablet TAKE 1 TABLET BY MOUTH DAILY 90 tablet 1  . LEVEMIR FLEXTOUCH 100  UNIT/ML FlexPen ADMINISTER 40 UNITS UNDER THE SKIN TWICE DAILY 15 mL 3  . meloxicam (MOBIC) 15 MG tablet TAKE 1 TABLET(15 MG) BY MOUTH DAILY 30 tablet 0  . metFORMIN (GLUCOPHAGE) 500 MG tablet TAKE 2 TABLETS(1000 MG) BY MOUTH TWICE DAILY WITH A MEAL 360 tablet 1  . NOVOTWIST 32G X 5 MM MISC USE WITH LEVEMIR PEN BID  5  . omeprazole (PRILOSEC) 10 MG capsule TAKE 1 CAPSULE BY MOUTH DAILY. 90 capsule 1  . paliperidone (INVEGA  SUSTENNA) 234 MG/1.5ML SUSY injection Inject 234 mg into the muscle once for 1 dose. 1.5 mL 11  . pioglitazone (ACTOS) 45 MG tablet TAKE 1 TABLET(45 MG) BY MOUTH DAILY 90 tablet 0  . predniSONE (DELTASONE) 20 MG tablet Take 2 tablets (40 mg total) by mouth daily with breakfast. 10 tablet 0  . Vilazodone HCl (VIIBRYD) 40 MG TABS Take 1 tablet (40 mg total) by mouth every morning. 90 tablet 0   Current Facility-Administered Medications  Medication Dose Route Frequency Provider Last Rate Last Admin  . paliperidone (INVEGA SUSTENNA) injection 234 mg  234 mg Intramuscular Q30 days Zena Amos, MD   234 mg at 07/27/19 1548     Musculoskeletal: Strength & Muscle Tone: unable to assess due to telemed visit Gait & Station: unable to assess due to telemed visit Patient leans: unable to assess due to telemed visit  Psychiatric Specialty Exam: ROS  There were no vitals taken for this visit.There is no height or weight on file to calculate BMI.  General Appearance: Well Groomed  Eye Contact:  Good  Speech:  Clear and Coherent and Normal Rate  Volume:  Normal  Mood:  Euthymic  Affect:  Restricted  Thought Process:  Goal Directed, Linear and Descriptions of Associations: Intact  Orientation:  Full (Time, Place, and Person)  Thought Content: Hallucinations: Auditory Visual and Paranoid Ideation   Suicidal Thoughts:  No  Homicidal Thoughts:  No  Memory:  Recent;   Good Remote;   Good  Judgement:  Fair  Insight:  Fair  Psychomotor Activity:  Decreased  Concentration:   Concentration: Good and Attention Span: Good  Recall:  Good  Fund of Knowledge: Good  Language: Good  Akathisia:  No  Handed:  Right  AIMS (if indicated): unable to complete due to telemed visit  Assets:  Communication Skills Desire for Improvement Financial Resources/Insurance Housing Social Support  ADL's:  Intact  Cognition: WNL  Sleep:  Good   Screenings: AIMS     [facility] Visit from 05/12/2017 in [facility] Office Visit from 07/29/2016 in [facility] Office Visit from 05/06/2016 in [facility] Office Visit from 04/01/2016 in [facility] Office Visit from 03/28/2015 in [facility]  AIMS Total Score  0  0  0  0  0    GAD-7     Office Visit from 04/28/2018 in [facility]  Total GAD-7 Score  8    PHQ2-9     Clinical Support from 01/19/2019 in [facility] Office Visit from 04/28/2018 in [facility] Clinical Support from 01/13/2018 in [facility] Clinical Support from 01/13/2017 in [facility] Office Visit from 09/24/2015 in [place]  PHQ-2 Total Score  0  4  2  2  3   PHQ-9 Total Score  --  11  8  12  8        Assessment and Plan: Patient continues to have auditory and visual hallucinations which are bothersome.  She stated that she is willing to try augmentation with another medication to help with this.  She was offered perphenazine. Potential side effects of medication and risks vs benefits of treatment vs non-treatment were explained and discussed. All questions were answered.  1. Schizoaffective disorder, depressive type (HCC) - Continue Invega Sustenna 234 mg qmonthly, scheduled for next dose this afternoon. - Vilazodone HCl (VIIBRYD) 40 MG TABS; Take 1 tablet (40 mg total) by mouth every  morning.  Dispense: 90 tablet; Refill: 1 - gabapentin (NEURONTIN) 600 MG  tablet; Take 1 tablet (600 mg total) by mouth 3 (three) times daily.  Dispense: 270 tablet; Refill: 1 - Continue divalproex (DEPAKOTE) 500 MG DR tablet; Take 1 tablet (500 mg total) by mouth BID.  Dispense: 180 tablet; Refill: 1 - amantadine (SYMMETREL) 100 MG capsule; Take 1 capsule (100 mg total) by mouth 2 (two) times daily.  Dispense: 180 capsule; Refill: 1 - Start Perphenazine 4 mg HS.  2. Obsessive-compulsive disorder, unspecified type  - Vilazodone HCl (VIIBRYD) 40 MG TABS; Take 1 tablet (40 mg total) by mouth every morning.  Dispense: 90 tablet; Refill: 1 - clonazePAM (KLONOPIN) 0.5 MG tablet; Take 0.5 tablets (0.25 mg total) by mouth 2 (two) times daily as needed for anxiety.  Dispense: 30 tablet; Refill: 2   Continue monthly IM Invega injections.  F/up in 4 weeks.   Zena Amos, MD 08/16/2019, 2:17 PM

## 2019-08-23 ENCOUNTER — Other Ambulatory Visit: Payer: Self-pay | Admitting: Family Medicine

## 2019-08-23 NOTE — Telephone Encounter (Signed)
Routing to provider  

## 2019-08-23 NOTE — Telephone Encounter (Signed)
Requested medication (s) are due for refill today: yes  Requested medication (s) are on the active medication list: yes  Last refill: 07/24/2019  Future visit scheduled:no  Notes to clinic: Patient was given a courtesy refill and has not schedule follow up   Requested Prescriptions  Pending Prescriptions Disp Refills   hydrochlorothiazide (HYDRODIURIL) 12.5 MG tablet [Pharmacy Med Name: HYDROCHLOROTHIAZIDE 12.5MG  TABLETS] 30 tablet 0    Sig: TAKE 1 TABLET BY MOUTH DAILY      Cardiovascular: Diuretics - Thiazide Failed - 08/23/2019  8:07 AM      Failed - Ca in normal range and within 360 days    Calcium  Date Value Ref Range Status  04/28/2018 10.1 8.7 - 10.2 mg/dL Final          Failed - Cr in normal range and within 360 days    Creat  Date Value Ref Range Status  09/26/2015 0.73 0.50 - 1.10 mg/dL Final   Creatinine, Ser  Date Value Ref Range Status  04/28/2018 0.79 0.57 - 1.00 mg/dL Final          Failed - K in normal range and within 360 days    Potassium  Date Value Ref Range Status  04/28/2018 4.0 3.5 - 5.2 mmol/L Final          Failed - Na in normal range and within 360 days    Sodium  Date Value Ref Range Status  04/28/2018 139 134 - 144 mmol/L Final          Failed - Valid encounter within last 6 months    Recent Outpatient Visits           6 months ago Acute pain of left knee   [facility] [place], [place]   9 months ago Pure hypercholesterolemia   [facility] [place], [place], [place]   1 year ago Controlled type 2 diabetes mellitus with diabetic neuropathy, with long-term current use of insulin [facility])   [facility] [place], [place], [place]   1 year ago Essential hypertension   [facility] [place], Megan P, [place]   2 years ago Controlled type 2 diabetes mellitus without complication, unspecified whether long term insulin use [facility])   [facility], [place],  [place]              Passed - Last BP in normal range    BP Readings from Last 1 Encounters:  02/16/19 (!) 143/74

## 2019-08-24 ENCOUNTER — Ambulatory Visit: Payer: Medicare Other

## 2019-08-31 ENCOUNTER — Ambulatory Visit (INDEPENDENT_AMBULATORY_CARE_PROVIDER_SITE_OTHER): Payer: Medicare Other

## 2019-08-31 ENCOUNTER — Other Ambulatory Visit: Payer: Self-pay

## 2019-08-31 DIAGNOSIS — F429 Obsessive-compulsive disorder, unspecified: Secondary | ICD-10-CM

## 2019-08-31 DIAGNOSIS — F251 Schizoaffective disorder, depressive type: Secondary | ICD-10-CM | POA: Diagnosis not present

## 2019-08-31 DIAGNOSIS — F3170 Bipolar disorder, currently in remission, most recent episode unspecified: Secondary | ICD-10-CM | POA: Diagnosis not present

## 2019-08-31 NOTE — Progress Notes (Signed)
invega sustenna 234mg  was given today left outer gluteal area.   Exp.   lot # [id number] S/N# [id number] GTIN: [id number]  NDC# [id number] pt tolerated well and seems to be in a good mood. Pt talked about her kids and their school testing coming up.   Pt to returned in 28 day for next injection.

## 2019-09-07 ENCOUNTER — Ambulatory Visit (INDEPENDENT_AMBULATORY_CARE_PROVIDER_SITE_OTHER): Payer: Medicare Other | Admitting: Family Medicine

## 2019-09-07 ENCOUNTER — Encounter: Payer: Self-pay | Admitting: Family Medicine

## 2019-09-07 ENCOUNTER — Other Ambulatory Visit: Payer: Self-pay

## 2019-09-07 ENCOUNTER — Other Ambulatory Visit: Payer: Self-pay | Admitting: Family Medicine

## 2019-09-07 VITALS — BP 146/82 | HR 90 | Temp 98.1°F | Wt 233.0 lb

## 2019-09-07 DIAGNOSIS — E1165 Type 2 diabetes mellitus with hyperglycemia: Secondary | ICD-10-CM | POA: Diagnosis not present

## 2019-09-07 DIAGNOSIS — E119 Type 2 diabetes mellitus without complications: Secondary | ICD-10-CM | POA: Diagnosis not present

## 2019-09-07 DIAGNOSIS — F3170 Bipolar disorder, currently in remission, most recent episode unspecified: Secondary | ICD-10-CM

## 2019-09-07 DIAGNOSIS — R9431 Abnormal electrocardiogram [ECG] [EKG]: Secondary | ICD-10-CM

## 2019-09-07 DIAGNOSIS — R079 Chest pain, unspecified: Secondary | ICD-10-CM | POA: Diagnosis not present

## 2019-09-07 DIAGNOSIS — Z794 Long term (current) use of insulin: Secondary | ICD-10-CM

## 2019-09-07 DIAGNOSIS — J4520 Mild intermittent asthma, uncomplicated: Secondary | ICD-10-CM

## 2019-09-07 DIAGNOSIS — E7800 Pure hypercholesterolemia, unspecified: Secondary | ICD-10-CM

## 2019-09-07 DIAGNOSIS — R413 Other amnesia: Secondary | ICD-10-CM

## 2019-09-07 DIAGNOSIS — I10 Essential (primary) hypertension: Secondary | ICD-10-CM

## 2019-09-07 DIAGNOSIS — L719 Rosacea, unspecified: Secondary | ICD-10-CM

## 2019-09-07 DIAGNOSIS — Z1231 Encounter for screening mammogram for malignant neoplasm of breast: Secondary | ICD-10-CM

## 2019-09-07 DIAGNOSIS — I152 Hypertension secondary to endocrine disorders: Secondary | ICD-10-CM

## 2019-09-07 DIAGNOSIS — E1159 Type 2 diabetes mellitus with other circulatory complications: Secondary | ICD-10-CM

## 2019-09-07 DIAGNOSIS — N390 Urinary tract infection, site not specified: Secondary | ICD-10-CM | POA: Diagnosis not present

## 2019-09-07 MED ORDER — NITROFURANTOIN MONOHYD MACRO 100 MG PO CAPS
100.0000 mg | ORAL_CAPSULE | Freq: Two times a day (BID) | ORAL | 0 refills | Status: DC
Start: 2019-09-07 — End: 2019-10-17

## 2019-09-07 MED ORDER — OZEMPIC (0.25 OR 0.5 MG/DOSE) 2 MG/1.5ML ~~LOC~~ SOPN: 0.2500 mg | PEN_INJECTOR | SUBCUTANEOUS | 0 refills | Status: DC

## 2019-09-07 MED ORDER — FLUCONAZOLE 150 MG PO TABS: 150.0000 mg | ORAL_TABLET | ORAL | 0 refills | Status: DC

## 2019-09-07 MED ORDER — TRESIBA FLEXTOUCH 100 UNIT/ML ~~LOC~~ SOPN: 40.0000 [IU] | PEN_INJECTOR | Freq: Every day | SUBCUTANEOUS | 1 refills | Status: DC

## 2019-09-07 MED ORDER — PIOGLITAZONE HCL 45 MG PO TABS: ORAL_TABLET | ORAL | 1 refills | Status: DC

## 2019-09-07 MED ORDER — ATORVASTATIN CALCIUM 20 MG PO TABS: ORAL_TABLET | ORAL | 1 refills | Status: DC

## 2019-09-07 MED ORDER — HYDROCHLOROTHIAZIDE 12.5 MG PO TABS: 12.5000 mg | ORAL_TABLET | Freq: Every day | ORAL | 1 refills | Status: DC

## 2019-09-07 MED ORDER — METFORMIN HCL 500 MG PO TABS: ORAL_TABLET | ORAL | 1 refills | Status: DC

## 2019-09-07 NOTE — Assessment & Plan Note (Addendum)
Will start ozempic in place of jardiance due to UTIs and mycotic infections, and switch from levemir to tresiba for more stable coverage . Discussed lifestyle changes at length. Continue rest of regimen as before

## 2019-09-07 NOTE — Telephone Encounter (Signed)
Requested medication (s) are due for refill today: Yes  Requested medication (s) are on the active medication list: Yes  Last refill:  Hydrodiuril 08/23/19  Doxycycline 06/02/19  Future visit scheduled: No  Notes to clinic:  Spoke with pt. She will call back and make appointment.    Requested Prescriptions  Pending Prescriptions Disp Refills   hydrochlorothiazide (HYDRODIURIL) 12.5 MG tablet [Pharmacy Med Name: HYDROCHLOROTHIAZIDE 12.5MG  TABLETS] 14 tablet 0    Sig: TAKE 1 TABLET BY MOUTH DAILY      Cardiovascular: Diuretics - Thiazide Failed - 09/07/2019  9:19 AM      Failed - Ca in normal range and within 360 days    Calcium  Date Value Ref Range Status  04/28/2018 10.1 8.7 - 10.2 mg/dL Final          Failed - Cr in normal range and within 360 days    Creat  Date Value Ref Range Status  09/26/2015 0.73 0.50 - 1.10 mg/dL Final   Creatinine, Ser  Date Value Ref Range Status  04/28/2018 0.79 0.57 - 1.00 mg/dL Final          Failed - K in normal range and within 360 days    Potassium  Date Value Ref Range Status  04/28/2018 4.0 3.5 - 5.2 mmol/L Final          Failed - Na in normal range and within 360 days    Sodium  Date Value Ref Range Status  04/28/2018 139 134 - 144 mmol/L Final          Failed - Last BP in normal range    BP Readings from Last 1 Encounters:  02/16/19 (!) 143/74          Failed - Valid encounter within last 6 months    Recent Outpatient Visits           6 months ago Acute pain of left knee   [facility] [place], [place]   9 months ago Pure hypercholesterolemia   [facility] [place], [place], [place]   1 year ago Controlled type 2 diabetes mellitus with diabetic neuropathy, with long-term current use of insulin [facility])   [facility] [place], [place], [place]   1 year ago Essential hypertension   [facility] [place], Megan P, DO   2 years ago Controlled type 2 diabetes  mellitus without complication, unspecified whether long term insulin use (HCC)   [facility], [place], [place]                doxycycline (VIBRAMYCIN) 50 MG capsule [Pharmacy Med Name: DOXYCYCLINE HYC 50MG  CAPS] 90 capsule 0    Sig: TAKE 1 CAPSULE(50 MG) BY MOUTH EVERY MORNING      Off-Protocol Failed - 09/07/2019  9:19 AM      Failed - Medication not assigned to a protocol, review manually.      Passed - Valid encounter within last 12 months    Recent Outpatient Visits           6 months ago Acute pain of left knee   [facility] [place], [place]   9 months ago Pure hypercholesterolemia   [facility] [place], [place], [place]   1 year ago Controlled type 2 diabetes mellitus with diabetic neuropathy, with long-term current use of insulin [facility])   [facility] [place], [place], [place]   1 year ago Essential hypertension  [place] P, [place]   2 years ago Controlled type 2 diabetes mellitus without complication, unspecified whether long term insulin use [facility])   [facility] [place], [place]

## 2019-09-07 NOTE — Progress Notes (Signed)
BP (!) 146/82   Pulse 90   Temp 98.1 F (36.7 C)   Wt 233 lb (105.7 kg)   SpO2 97%   BMI 42.62 kg/m    Subjective:    Patient ID: Tabitha [name], female    DOB: 1971-01-15, 49 y.o.   MRN: [id number]  HPI: Tabitha [name] is a 49 y.o. female  Chief Complaint  Patient presents with  . Hypertension  . Hyperlipidemia  . Diabetes  . Edema  . Urinary Frequency   Here today overdue for 6 month follow up and also has numerous other concerns.   About a month of BSs in the 200s range, no changes that she can figure could be causing issues. Taking 40 units of levemir daily in addition to her oral agents. Denies low blood sugar spells. Not careful with diet or exercise.   Has not been checking home BPs. Taking medications faithfully without side effects. Does note some substernal CP and occasional DOE the past few weeks here and there which has been very worrisome to her. Denies diaphoresis, dizziness, syncope with these. Does have a hx of abnormal EKG in 2019 which she was referred to Cardiology at that time but it does not appear she followed through with this consultation.   HLD - on lipitor, tolerating well without side effects. Denies claudication, myalgias.   Urinary frequency, dysuria, lower abdominal pressure, and vaginal itching and discharge the past few months off and on. Denies rashes, concern for STIs, fever, chills, N/V. Of note, pt does take jardiance for her DM which is currently under poor control.   OCD, schizoaffective disorder, bipolar disorder, anxiety and depression - followed by Psychiatry, feels fairly well on her current regimen. No SI/HI.   Notes memory changes the past few years, worse the past few months. Short term recall and long term memories affected per patient. No recent stark change.   Depression screen [facility] 2/9 01/19/2019 04/28/2018 01/13/2018  Decreased Interest 0 2 1  Down, Depressed, Hopeless 0 2 1  PHQ - 2 Score 0 4 2  Altered sleeping - 1 1    Tired, decreased energy - 2 1  Change in appetite - 2 1  Feeling bad or failure about yourself  - 1 1  Trouble concentrating - 1 1  Moving slowly or fidgety/restless - 0 1  Suicidal thoughts - 0 0  PHQ-9 Score - 11 8  Difficult doing work/chores - - Not difficult at all  Some encounter information is confidential and restricted. Go to Review Flowsheets activity to see all data.   GAD 7 : Generalized Anxiety Score 04/28/2018  Nervous, Anxious, on Edge 1  Control/stop worrying 1  Worry too much - different things 1  Trouble relaxing 1  Restless 1  Easily annoyed or irritable 1  Afraid - awful might happen 2  Total GAD 7 Score 8  No flowsheet data found.    Relevant past medical, surgical, family and social history reviewed and updated as indicated. Interim medical history since our last visit reviewed. Allergies and medications reviewed and updated.  Review of Systems  Per HPI unless specifically indicated above     Objective:    BP (!) 146/82   Pulse 90   Temp 98.1 F (36.7 C)   Wt 233 lb (105.7 kg)   SpO2 97%   BMI 42.62 kg/m   Wt Readings from Last 3 Encounters:  09/07/19 233 lb (105.7 kg)  01/19/19 234 lb (106.1 kg)  11/15/18 228 lb (103.4 kg)    Physical Exam Vitals and nursing note reviewed.  Constitutional:      General: She is not in acute distress.    Appearance: Normal appearance. She is obese. She is not ill-appearing.  HENT:     Head: Atraumatic.  Eyes:     Extraocular Movements: Extraocular movements intact.     Conjunctiva/sclera: Conjunctivae normal.  Cardiovascular:     Rate and Rhythm: Normal rate and regular rhythm.     Heart sounds: Normal heart sounds.  Pulmonary:     Effort: Pulmonary effort is normal. No respiratory distress.     Breath sounds: Normal breath sounds. No wheezing.  Abdominal:     General: Bowel sounds are normal. There is no distension.     Palpations: Abdomen is soft.     Tenderness: There is no abdominal tenderness.  There is no right CVA tenderness, left CVA tenderness or guarding.  Musculoskeletal:        General: Normal range of motion.     Cervical back: Normal range of motion and neck supple.  Skin:    General: Skin is warm and dry.  Neurological:     Mental Status: She is alert and oriented to person, place, and time.  Psychiatric:        Mood and Affect: Mood normal.        Thought Content: Thought content normal.        Judgment: Judgment normal.     Results for orders placed or performed in visit on 09/07/19  Microscopic Examination   URINE  Result Value Ref Range   WBC, UA 11-30 (A) 0 - 5 /hpf   RBC 3-10 (A) 0 - 2 /hpf   Epithelial Cells (non renal) 0-10 0 - 10 /hpf   Bacteria, UA Few (A) None seen/Few   Yeast, UA Present None seen  Urine Culture, Reflex   URINE  Result Value Ref Range   Urine Culture, Routine WILL FOLLOW   UA/M w/rflx Culture, Routine (STAT)   Specimen: Urine   URINE  Result Value Ref Range   Specific Gravity, UA 1.010 1.005 - 1.030   pH, UA 5.5 5.0 - 7.5   Color, UA Yellow Yellow   Appearance Ur Clear Clear   Leukocytes,UA 1+ (A) Negative   Protein,UA Negative Negative/Trace   Glucose, UA 3+ (A) Negative   Ketones, UA Trace (A) Negative   RBC, UA Trace (A) Negative   Bilirubin, UA Negative Negative   Urobilinogen, Ur 0.2 0.2 - 1.0 mg/dL   Nitrite, UA Negative Negative   Microscopic Examination See below:    Urinalysis Reflex Comment       Assessment & Plan:   Problem List Items Addressed This Visit      Cardiovascular and Mediastinum   Hypertension associated with diabetes (HCC)    Mildly elevated, but anxious today about several medical concerns she's been having. Will begin monitoring home readings and recheck at upcoming Cardiology consultation for her chest pain and abnormal EKG. WIll also recheck at upcoming OV      Relevant Medications   insulin degludec (TRESIBA FLEXTOUCH) 100 UNIT/ML FlexTouch Pen   pioglitazone (ACTOS) 45 MG tablet     Semaglutide,0.25 or 0.5MG /DOS, (OZEMPIC, 0.25 OR 0.5 MG/DOSE,) 2 MG/1.5ML SOPN   metFORMIN (GLUCOPHAGE) 500 MG tablet   hydrochlorothiazide (HYDRODIURIL) 12.5 MG tablet   atorvastatin (LIPITOR) 20 MG tablet     Respiratory   Asthma    Fairly stable, no  evidence of exacerbation causing her DOE. Suspect more deconditioning vs cardiac. Continue current regimen and await further workup        Endocrine   Type 2 diabetes mellitus with hyperglycemia, with long-term current use of insulin (HCC)    Will start ozempic in place of jardiance due to UTIs and mycotic infections, and switch from levemir to tresiba for more stable coverage . Discussed lifestyle changes at length. Continue rest of regimen as before      Relevant Medications   insulin degludec (TRESIBA FLEXTOUCH) 100 UNIT/ML FlexTouch Pen   pioglitazone (ACTOS) 45 MG tablet   Semaglutide,0.25 or 0.5MG /DOS, (OZEMPIC, 0.25 OR 0.5 MG/DOSE,) 2 MG/1.5ML SOPN   metFORMIN (GLUCOPHAGE) 500 MG tablet   atorvastatin (LIPITOR) 20 MG tablet   Other Relevant Orders   HgB A1c     Other   Hyperlipidemia    Recheck lipids, adjust as needed. Continue current regimen and work on diet modifications.       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 Also tx for yeast found in urine with diflucan tabs. D/c jardiance as this could be perpetuating issue   Relevant Medications   nitrofurantoin, macrocrystal-monohydrate, (MACROBID) 100 MG capsule   fluconazole (DIFLUCAN) 150 MG tablet   Other Relevant Orders   UA/M w/rflx Culture, Routine (STAT) (Completed)   Essential hypertension       Relevant Medications   hydrochlorothiazide (HYDRODIURIL) 12.5 MG tablet   atorvastatin (LIPITOR) 20 MG tablet   Other Relevant Orders   Comprehensive metabolic panel   Chest pain, unspecified type       Relevant Orders   EKG 12-Lead (Completed)   Ambulatory referral to Cardiology   Abnormal ECG       Never went in 2019 for abnormal ECG, minimal change since but increasing sxs. Declines fervently going to ER, agreeable to urgent Cardiology referral   Relevant Orders   Ambulatory referral to Cardiology   Memory changes       Scored a 16/30 on mini mental today (see scanned form). Recommended she discuss w/ Psychiatry in case related to her meds/conditions. Neuro referral if not   Encounter for screening mammogram for malignant neoplasm of breast       Relevant Orders   MM Digital Diagnostic Bilat      50 minutes spent today in direct patient care, counseling, and coordination   Follow up plan: Return in about 4 weeks (around 10/05/2019) for DM, BP f/u.

## 2019-09-07 NOTE — Telephone Encounter (Signed)
See message below °

## 2019-09-08 LAB — LIPID PANEL W/O CHOL/HDL RATIO
Cholesterol, Total: 138 mg/dL (ref 100–199)
HDL: 55 mg/dL (ref >39)
LDL Chol Calc (NIH): 51 mg/dL (ref 0–99)
Triglycerides: 202 mg/dL — ABNORMAL HIGH (ref 0–149)
VLDL Cholesterol Cal: 32 mg/dL (ref 5–40)

## 2019-09-08 LAB — COMPREHENSIVE METABOLIC PANEL WITH GFR
ALT: 16 [IU]/L (ref 0–32)
AST: 11 [IU]/L (ref 0–40)
Albumin/Globulin Ratio: 1.8 (ref 1.2–2.2)
Albumin: 4.2 g/dL (ref 3.8–4.8)
Alkaline Phosphatase: 90 [IU]/L (ref 39–117)
BUN/Creatinine Ratio: 16 (ref 9–23)
BUN: 14 mg/dL (ref 6–24)
Bilirubin Total: 0.2 mg/dL (ref 0.0–1.2)
CO2: 24 mmol/L (ref 20–29)
Calcium: 9.4 mg/dL (ref 8.7–10.2)
Chloride: 94 mmol/L — ABNORMAL LOW (ref 96–106)
Creatinine, Ser: 0.9 mg/dL (ref 0.57–1.00)
GFR calc Af Amer: 88 mL/min/{1.73_m2}
GFR calc non Af Amer: 76 mL/min/{1.73_m2}
Globulin, Total: 2.3 g/dL (ref 1.5–4.5)
Glucose: 220 mg/dL — ABNORMAL HIGH (ref 65–99)
Potassium: 4.1 mmol/L (ref 3.5–5.2)
Sodium: 137 mmol/L (ref 134–144)
Total Protein: 6.5 g/dL (ref 6.0–8.5)

## 2019-09-08 LAB — HEMOGLOBIN A1C
Est. average glucose Bld gHb Est-mCnc: 143 mg/dL
Hgb A1c MFr Bld: 6.6 % — ABNORMAL HIGH (ref 4.8–5.6)

## 2019-09-08 NOTE — Assessment & Plan Note (Signed)
Recheck lipids, adjust as needed. Continue current regimen and work on diet modifications.

## 2019-09-08 NOTE — Assessment & Plan Note (Signed)
Mildly elevated, but anxious today about several medical concerns she's been having. Will begin monitoring home readings and recheck at upcoming Cardiology consultation for her chest pain and abnormal EKG. WIll also recheck at upcoming OV

## 2019-09-08 NOTE — Assessment & Plan Note (Signed)
Followed by Psychiatry, continue current regimen. 

## 2019-09-08 NOTE — Assessment & Plan Note (Signed)
Fairly stable, no evidence of exacerbation causing her DOE. Suspect more deconditioning vs cardiac. Continue current regimen and await further workup

## 2019-09-09 LAB — MICROSCOPIC EXAMINATION

## 2019-09-09 LAB — UA/M W/RFLX CULTURE, ROUTINE
Bilirubin, UA: NEGATIVE
Nitrite, UA: NEGATIVE
Protein,UA: NEGATIVE
Specific Gravity, UA: 1.01 (ref 1.005–1.030)
Urobilinogen, Ur: 0.2 mg/dL (ref 0.2–1.0)
pH, UA: 5.5 (ref 5.0–7.5)

## 2019-09-09 LAB — URINE CULTURE, REFLEX

## 2019-09-13 ENCOUNTER — Other Ambulatory Visit: Payer: Self-pay

## 2019-09-13 ENCOUNTER — Telehealth (INDEPENDENT_AMBULATORY_CARE_PROVIDER_SITE_OTHER): Payer: Medicare Other | Admitting: Psychiatry

## 2019-09-13 ENCOUNTER — Telehealth: Payer: Self-pay | Admitting: Family Medicine

## 2019-09-13 ENCOUNTER — Encounter: Payer: Self-pay | Admitting: Family Medicine

## 2019-09-13 ENCOUNTER — Encounter: Payer: Self-pay | Admitting: Psychiatry

## 2019-09-13 DIAGNOSIS — F429 Obsessive-compulsive disorder, unspecified: Secondary | ICD-10-CM | POA: Diagnosis not present

## 2019-09-13 DIAGNOSIS — F251 Schizoaffective disorder, depressive type: Secondary | ICD-10-CM

## 2019-09-13 MED ORDER — GABAPENTIN 600 MG PO TABS: 600.0000 mg | ORAL_TABLET | Freq: Three times a day (TID) | ORAL | 0 refills | Status: DC

## 2019-09-13 NOTE — Addendum Note (Signed)
Addended by: Zena Amos on: 09/13/2019 04:42 PM   Modules accepted: Level of Service

## 2019-09-13 NOTE — Telephone Encounter (Signed)
Advised pt of lab results.

## 2019-09-13 NOTE — Telephone Encounter (Signed)
Copied from CRM [id number]. Topic: Quick Communication - Lab Results (Clinic Use ONLY) >> Sep 13, 2019  2:52 PM Dawna Part D wrote: Pt called back needed lab results

## 2019-09-13 NOTE — Progress Notes (Addendum)
BH MD OP Progress Note  Virtual Visit via Telephone Note  I connected with Tabitha [name] on 09/13/19 at  4:00 PM EDT by telephone and verified that I am speaking with the correct person using two identifiers.  Location: Patient: home Provider: Clinic   I discussed the limitations, risks, security and privacy concerns of performing an evaluation and management service by telephone and the availability of in person appointments. I also discussed with the patient that there may be a patient responsible charge related to this service. The patient expressed understanding and agreed to proceed.   I provided 12 minutes of non-face-to-face time during this encounter.      09/13/2019 4:33 PM Tabitha [name]  MRN:  [id number]  Chief Complaint:  " I've been having some memory loss issues."  HPI: Pt reported that she is having memory loss issues. She notes that she was seen by he PCP who suggested that she follow back up with psychiatry to determine if psychiatric medications could be the cause of her memory loss. Medication list reviewed by provider and patient educated on its side effects. Patient was informed that current medication regimen does not cause memory loss.   Patient also endorses auditory hallucinations. She reports that she hears people calling her name who are not there. She notes that she stopped taking perphenazine because she believed it caused her blood sugar to increase. Provider educated patient that if symptoms were alleviated by taking the medication her memory may improve due to less distractions. on the side effects of perphenazine and informed her that it was safe to take. Provider also informed patient that She endorsed understanding and agreed to restart medications to alleviate symptoms. Patient reports that she has an increased appetite and wants to eat a lot.   Provider informed patient that Depakote can cause increased appetite. She endorsed understanding. No  other concerns noted at this time.     Visit Diagnosis:    ICD-10-CM   1. Schizoaffective disorder, depressive type (HCC)  F25.1 gabapentin (NEURONTIN) 600 MG tablet  2. Obsessive-compulsive disorder, unspecified type  F42.9     Past Psychiatric History: Schizoaffective disorder, OCD  Past Medical History:  Past Medical History:  Diagnosis Date  . Allergic rhinitis   . Anxiety   . Asthma   . Bipolar disorder (HCC)   . Depression   . Diabetes mellitus without complication (HCC)   . GERD (gastroesophageal reflux disease)   . Hyperlipidemia   . Mild sleep apnea   . OCD (obsessive compulsive disorder)   . Palpitations   . Paranoia (HCC)   . Schizoaffective disorder (HCC)   . Sleep apnea     Past Surgical History:  Procedure Laterality Date  . CESAREAN SECTION  2006/2008  . CHOLECYSTECTOMY  2006    Family Psychiatric History: denied  Family History:  Family History  Problem Relation Age of Onset  . Diabetes Father   . Hyperlipidemia Father   . Diabetes Brother   . Lupus Mother   . Diabetes Mother        pre diabetes    Social History:  Social History   Socioeconomic History  . Marital status: Married    Spouse name: kevin  . Number of children: 2  . Years of education: Not on file  . Highest education level: Bachelor's degree (e.g., BA, AB, BS)  Occupational History  . Not on file  Tobacco Use  . Smoking status: Never Smoker  . Smokeless tobacco: Never  Used  Substance and Sexual Activity  . Alcohol use: No    Alcohol/week: 0.0 standard drinks  . Drug use: No  . Sexual activity: Yes    Partners: Male    Birth control/protection: None, Surgical  Other Topics Concern  . Not on file  Social History Narrative  . Not on file   Social Determinants of Health   Financial Resource Strain:   . Difficulty of Paying Living Expenses:   Food Insecurity:   . Worried About Programme researcher, broadcasting/film/video in the Last Year:   . Barista in the Last Year:    Transportation Needs:   . Freight forwarder (Medical):   Marland Kitchen Lack of Transportation (Non-Medical):   Physical Activity:   . Days of Exercise per Week:   . Minutes of Exercise per Session:   Stress:   . Feeling of Stress :   Social Connections:   . Frequency of Communication with Friends and Family:   . Frequency of Social Gatherings with Friends and Family:   . Attends Religious Services:   . Active Member of Clubs or Organizations:   . Attends Banker Meetings:   Marland Kitchen Marital Status:     Allergies:  Allergies  Allergen Reactions  . Abilify [Aripiprazole] Hives    Metabolic Disorder Labs: Lab Results  Component Value Date   HGBA1C 6.6 (H) 09/07/2019   Lab Results  Component Value Date   PROLACTIN 84.8 (H) 06/01/2017   PROLACTIN 36.2 (H) 03/26/2016   Lab Results  Component Value Date   CHOL 138 09/07/2019   TRIG 202 (H) 09/07/2019   HDL 55 09/07/2019   CHOLHDL 2.9 09/26/2015   VLDL 51 (H) 04/23/2017   LDLCALC 51 09/07/2019   LDLCALC 63 04/28/2018   Lab Results  Component Value Date   TSH 1.290 04/28/2018   TSH 2.570 06/01/2017    Therapeutic Level Labs: No results found for: LITHIUM Lab Results  Component Value Date   VALPROATE 52 10/22/2017   VALPROATE 43 (L) 06/01/2017   No components found for:  CBMZ  Current Medications: Current Outpatient Medications  Medication Sig Dispense Refill  . albuterol (PROAIR HFA) 108 (90 Base) MCG/ACT inhaler Inhale 2 puffs into the lungs every 6 (six) hours as needed for wheezing or shortness of breath. 8.5 g 6  . amantadine (SYMMETREL) 100 MG capsule Take 1 capsule (100 mg total) by mouth 2 (two) times daily. 180 capsule 0  . atorvastatin (LIPITOR) 20 MG tablet Take 1 tab daily 90 tablet 1  . clonazePAM (KLONOPIN) 0.5 MG tablet Take 0.5 tablets (0.25 mg total) by mouth 2 (two) times daily as needed for anxiety. 30 tablet 2  . divalproex (DEPAKOTE) 500 MG DR tablet Take 1 tablet (500 mg total) by mouth 2  (two) times daily. 180 tablet 0  . doxycycline (VIBRAMYCIN) 50 MG capsule TAKE 1 CAPSULE(50 MG) BY MOUTH EVERY MORNING 90 capsule 0  . fluconazole (DIFLUCAN) 150 MG tablet Take 1 tablet (150 mg total) by mouth every other day. 3 tablet 0  . fluticasone (FLOVENT HFA) 110 MCG/ACT inhaler Inhale 1 puff into the lungs 2 (two) times daily. 1 Inhaler 11  . gabapentin (NEURONTIN) 600 MG tablet Take 1 tablet (600 mg total) by mouth 3 (three) times daily. 270 tablet 0  . glucose blood (ACCU-CHEK AVIVA PLUS) test strip 1 each by Other route as needed for other. Use as instructed 100 each 12  . hydrochlorothiazide (HYDRODIURIL) 12.5 MG tablet  Take 1 tablet (12.5 mg total) by mouth daily. 90 tablet 1  . insulin degludec (TRESIBA FLEXTOUCH) 100 UNIT/ML FlexTouch Pen Inject 0.4 mLs (40 Units total) into the skin daily. 15 mL 1  . Insulin Pen Needle (BD PEN NEEDLE NANO U/F) 32G X 4 MM MISC 1 each by Does not apply route daily. 100 each 12  . meloxicam (MOBIC) 15 MG tablet TAKE 1 TABLET(15 MG) BY MOUTH DAILY 30 tablet 0  . metFORMIN (GLUCOPHAGE) 500 MG tablet TAKE 2 TABLETS(1000 MG) BY MOUTH TWICE DAILY WITH A MEAL 360 tablet 1  . nitrofurantoin, macrocrystal-monohydrate, (MACROBID) 100 MG capsule Take 1 capsule (100 mg total) by mouth 2 (two) times daily. 14 capsule 0  . NOVOTWIST 32G X 5 MM MISC USE WITH LEVEMIR PEN BID  5  . omeprazole (PRILOSEC) 10 MG capsule TAKE 1 CAPSULE BY MOUTH DAILY. 90 capsule 1  . paliperidone (INVEGA SUSTENNA) 234 MG/1.5ML SUSY injection Inject 234 mg into the muscle once for 1 dose. 1.5 mL 11  . perphenazine (TRILAFON) 4 MG tablet Take one tablet at bedtime 90 tablet 0  . pioglitazone (ACTOS) 45 MG tablet TAKE 1 TABLET(45 MG) BY MOUTH DAILY 90 tablet 1  . Semaglutide,0.25 or 0.5MG /DOS, (OZEMPIC, 0.25 OR 0.5 MG/DOSE,) 2 MG/1.5ML SOPN Inject 0.25 mg into the skin once a week. 1 pen 0  . Vilazodone HCl (VIIBRYD) 40 MG TABS Take 1 tablet (40 mg total) by mouth every morning. 90 tablet 0    Current Facility-Administered Medications  Medication Dose Route Frequency Provider Last Rate Last Admin  . paliperidone (INVEGA SUSTENNA) injection 234 mg  234 mg Intramuscular Q30 days [place] Crane, MD   234 mg at 08/31/19 1528     Musculoskeletal: Strength & Muscle Tone: unable to assess due to telemed visit Gait & Station: unable to assess due to telemed visit Patient leans: unable to assess due to telemed visit  Psychiatric Specialty Exam: ROS  There were no vitals taken for this visit.There is no height or weight on file to calculate BMI.  General Appearance: Well Groomed  Eye Contact:  Good  Speech:  Clear and Coherent and Normal Rate  Volume:  Normal  Mood:  Euthymic  Affect:  Restricted  Thought Process:  Goal Directed, Linear and Descriptions of Associations: Intact  Orientation:  Full (Time, Place, and Person)  Thought Content: Hallucinations: Auditory and Paranoid Ideation   Suicidal Thoughts:  No  Homicidal Thoughts:  No  Memory:  Recent;   Good Remote;   Good  Judgement:  Fair  Insight:  Fair  Psychomotor Activity:  Decreased  Concentration:  Concentration: Good and Attention Span: Good  Recall:  Good  Fund of Knowledge: Good  Language: Good  Akathisia:  No  Handed:  Right  AIMS (if indicated): unable to complete due to telemed visit  Assets:  Communication Skills Desire for Improvement Financial Resources/Insurance Housing Social Support  ADL's:  Intact  Cognition: WNL  Sleep:  Good   Screenings: AIMS     Office Visit from 05/12/2017 in [place] Visit from 07/29/2016 in [place] Visit from 05/06/2016 in [place] Visit from 04/01/2016 in [place] Visit from 03/28/2015 in [place] Total Score  0  0  0  0  0    GAD-7     Office Visit from 04/28/2018 in [facility]  Total GAD-7 Score  8  Mini-Mental     [facility] Visit from 09/07/2019 in [facility]  Total Score (max 30 points )  16    PHQ2-9     Clinical Support from 01/19/2019 in [facility] Office Visit from 04/28/2018 in [facility] Clinical Support from 01/13/2018 in [facility] Clinical Support from 01/13/2017 in [facility] Office Visit from 09/24/2015 in [place]  PHQ-2 Total Score  0  4  2  2  3   PHQ-9 Total Score  --  11  8  12  8        Assessment and Plan: Pt reported memory loss and auditory hallucinations. She informed provider that she stopped taking perphenazine because she suspected it increased her blood sugar. Provider educated patient on side effects of perphenazine and informed her that it was safe to take. Patient agreeable to retry perphenazine to alleviate symptoms.      1. Schizoaffective disorder, depressive type (HCC) - Continue Invega Sustenna 234 mg qmonthly, next dose due on January 26. - Vilazodone HCl (VIIBRYD) 40 MG TABS; Take 1 tablet (40 mg total) by mouth every morning.  Dispense: 90 tablet; Refill: 1 - gabapentin (NEURONTIN) 600 MG tablet; Take 1 tablet (600 mg total) by mouth 3 (three) times daily.  Dispense: 270 tablet; Refill: 1 - Increase divalproex (DEPAKOTE) 500 MG DR tablet; Take 1 tablet (500 mg total) by mouth BID.  Dispense: 180 tablet; Refill: 1 - amantadine (SYMMETREL) 100 MG capsule; Take 1 capsule (100 mg total) by mouth 2 (two) times daily.  Dispense: 180 capsule; Refill: 1  2. Obsessive-compulsive disorder, unspecified type  - Vilazodone HCl (VIIBRYD) 40 MG TABS; Take 1 tablet (40 mg total) by mouth every morning.  Dispense: 90 tablet; Refill: 1 - clonazePAM (KLONOPIN) 0.5 MG tablet; Take 0.5 tablets (0.25 mg total) by mouth 2 (two) times daily as needed for anxiety.  Dispense: 30 tablet; Refill: 2      Continue monthly IM Invega  injections. Continue same regimen for now. F/up in one month.   , NP 09/13/2019, 4:33 PM    I saw and evaluated the pt with NP B. Shanna Cisco. 09/15/2019, MD 09/13/2019 4:42 PM

## 2019-09-14 ENCOUNTER — Ambulatory Visit (INDEPENDENT_AMBULATORY_CARE_PROVIDER_SITE_OTHER): Payer: Medicare Other | Admitting: Cardiology

## 2019-09-14 ENCOUNTER — Encounter: Payer: Self-pay | Admitting: Cardiology

## 2019-09-14 VITALS — BP 120/76 | HR 83 | Ht 62.0 in | Wt 236.0 lb

## 2019-09-14 DIAGNOSIS — R0609 Other forms of dyspnea: Secondary | ICD-10-CM

## 2019-09-14 DIAGNOSIS — I10 Essential (primary) hypertension: Secondary | ICD-10-CM

## 2019-09-14 DIAGNOSIS — R0600 Dyspnea, unspecified: Secondary | ICD-10-CM

## 2019-09-14 DIAGNOSIS — R072 Precordial pain: Secondary | ICD-10-CM

## 2019-09-14 DIAGNOSIS — Z01812 Encounter for preprocedural laboratory examination: Secondary | ICD-10-CM

## 2019-09-14 DIAGNOSIS — R079 Chest pain, unspecified: Secondary | ICD-10-CM | POA: Diagnosis not present

## 2019-09-14 DIAGNOSIS — R42 Dizziness and giddiness: Secondary | ICD-10-CM | POA: Diagnosis not present

## 2019-09-14 NOTE — Patient Instructions (Signed)
Medication Instructions:  No changes. *If you need a refill on your cardiac medications before your next appointment, please call your pharmacy*   Lab Work: You will need a urine pregnancy test the morning of your stress test. Please arrive 45 minutes prior to your scheduled appointment time. This will be done at the medical mall entrance. Please go to the first desk on the right passed the screening table.   If you have labs (blood work) drawn today and your tests are completely normal, you will receive your results only by: Marland Kitchen MyChart Message (if you have MyChart) OR . A paper copy in the mail If you have any lab test that is abnormal or we need to change your treatment, we will call you to review the results.   Testing/Procedures: Your physician has requested that you have a lexiscan myoview.  [facility] MYOVIEW  Your caregiver has ordered a Stress Test with nuclear imaging. The purpose of this test is to evaluate the blood supply to your heart muscle. This procedure is referred to as a "Non-Invasive Stress Test." This is because other than having an IV started in your vein, nothing is inserted or "invades" your body. Cardiac stress tests are done to find areas of poor blood flow to the heart by determining the extent of coronary artery disease (CAD). Some patients exercise on a treadmill, which naturally increases the blood flow to your heart, while others who are  unable to walk on a treadmill due to physical limitations have a pharmacologic/chemical stress agent called Lexiscan . This medicine will mimic walking on a treadmill by temporarily increasing your coronary blood flow.   Please note: these test may take anywhere between 2-4 hours to complete  PLEASE REPORT TO [facility] MEDICAL MALL ENTRANCE  THE VOLUNTEERS AT THE FIRST DESK WILL DIRECT YOU WHERE TO GO  Date of Procedure:_____________________________________  Arrival Time for Procedure:______________________________  Instructions  regarding medication:   _X_ : Hold diabetes medication morning of procedure (all oral and insulin). You can take a half of your regular insulin dose the evening prior to your test.  _X___:  Hold other medications as follows: Hold your HCTZ the morning of your test.   PLEASE NOTIFY THE OFFICE AT LEAST 24 HOURS IN ADVANCE IF YOU ARE UNABLE TO KEEP YOUR APPOINTMENT.  [phone] AND  PLEASE NOTIFY NUCLEAR MEDICINE AT [facility] AT LEAST 24 HOURS IN ADVANCE IF YOU ARE UNABLE TO KEEP YOUR APPOINTMENT. [phone]  How to prepare for your Myoview test:  1. Do not eat or drink after midnight 2. No caffeine for 24 hours prior to test 3. No smoking 24 hours prior to test. 4. Your medication may be taken with water.  If your doctor stopped a medication because of this test, do not take that medication. 5. Ladies, please do not wear dresses.  Skirts or pants are appropriate. Please wear a short sleeve shirt. 6. No perfume, cologne or lotion. 7. Wear comfortable walking shoes. No heels!         Your physician has requested that you have an echocardiogram. Echocardiography is a painless test that uses sound waves to create images of your heart. It provides your doctor with information about the size and shape of your heart and how well your heart's chambers and valves are working. This procedure takes approximately one hour. There are no restrictions for this procedure.     Follow-Up: At [facility], you and your health needs are our priority.  As part of  our continuing mission to provide you with exceptional heart care, we have created designated Provider Care Teams.  These Care Teams include your primary Cardiologist (physician) and Advanced Practice Providers (APPs -  Physician Assistants and Nurse Practitioners) who all work together to provide you with the care you need, when you need it.  We recommend signing up for the patient portal called "MyChart".  Sign up information is provided on  this After Visit Summary.  MyChart is used to connect with patients for Virtual Visits (Telemedicine).  Patients are able to view lab/test results, encounter notes, upcoming appointments, etc.  Non-urgent messages can be sent to your provider as well.   To learn more about what you can do with MyChart, go to [email or web address].    Your next appointment:   After your testing is completed.    The format for your next appointment:   In Person  Provider:   Debbe Odea, MD   Other Instructions   Echocardiogram An echocardiogram is a procedure that uses painless sound waves (ultrasound) to produce an image of the heart. Images from an echocardiogram can provide important information about:  Signs of coronary artery disease (CAD).  Aneurysm detection. An aneurysm is a weak or damaged part of an artery wall that bulges out from the normal force of blood pumping through the body.  Heart size and shape. Changes in the size or shape of the heart can be associated with certain conditions, including heart failure, aneurysm, and CAD.  Heart muscle function.  Heart valve function.  Signs of a past heart attack.  Fluid buildup around the heart.  Thickening of the heart muscle.  A tumor or infectious growth around the heart valves. Tell a health care provider about:  Any allergies you have.  All medicines you are taking, including vitamins, herbs, eye drops, creams, and over-the-counter medicines.  Any blood disorders you have.  Any surgeries you have had.  Any medical conditions you have.  Whether you are pregnant or may be pregnant. What are the risks? Generally, this is a safe procedure. However, problems may occur, including:  Allergic reaction to dye (contrast) that may be used during the procedure. What happens before the procedure? No specific preparation is needed. You may eat and drink normally. What happens during the procedure?   An IV tube may be  inserted into one of your veins.  You may receive contrast through this tube. A contrast is an injection that improves the quality of the pictures from your heart.  A gel will be applied to your chest.  A wand-like tool (transducer) will be moved over your chest. The gel will help to transmit the sound waves from the transducer.  The sound waves will harmlessly bounce off of your heart to allow the heart images to be captured in real-time motion. The images will be recorded on a computer. The procedure may vary among health care providers and hospitals. What happens after the procedure?  You may return to your normal, everyday life, including diet, activities, and medicines, unless your health care provider tells you not to do that. Summary  An echocardiogram is a procedure that uses painless sound waves (ultrasound) to produce an image of the heart.  Images from an echocardiogram can provide important information about the size and shape of your heart, heart muscle function, heart valve function, and fluid buildup around your heart.  You do not need to do anything to prepare before this procedure. You may eat and  drink normally.  After the echocardiogram is completed, you may return to your normal, everyday life, unless your health care provider tells you not to do that. This information is not intended to replace advice given to you by your health care provider. Make sure you discuss any questions you have with your health care provider. Document Revised: 08/04/2018 Document Reviewed: 05/16/2016 Elsevier Patient Education  2020 Elsevier Inc.   Cardiac Nuclear Scan A cardiac nuclear scan is a test that measures blood flow to the heart when a person is resting and when he or she is exercising. The test looks for problems such as:  Not enough blood reaching a portion of the heart.  The heart muscle not working normally. You may need this test if:  You have heart disease.  You  have had abnormal lab results.  You have had heart surgery or a balloon procedure to open up blocked arteries (angioplasty).  You have chest pain.  You have shortness of breath. In this test, a radioactive dye (tracer) is injected into your bloodstream. After the tracer has traveled to your heart, an imaging device is used to measure how much of the tracer is absorbed by or distributed to various areas of your heart. This procedure is usually done at a hospital and takes 2-4 hours. Tell a health care provider about:  Any allergies you have.  All medicines you are taking, including vitamins, herbs, eye drops, creams, and over-the-counter medicines.  Any problems you or family members have had with anesthetic medicines.  Any blood disorders you have.  Any surgeries you have had.  Any medical conditions you have.  Whether you are pregnant or may be pregnant. What are the risks? Generally, this is a safe procedure. However, problems may occur, including:  Serious chest pain and heart attack. This is only a risk if the stress portion of the test is done.  Rapid heartbeat.  Sensation of warmth in your chest. This usually passes quickly.  Allergic reaction to the tracer. What happens before the procedure?  Ask your health care provider about changing or stopping your regular medicines. This is especially important if you are taking diabetes medicines or blood thinners.  Follow instructions from your health care provider about eating or drinking restrictions.  Remove your jewelry on the day of the procedure. What happens during the procedure?  An IV will be inserted into one of your veins.  Your health care provider will inject a small amount of radioactive tracer through the IV.  You will wait for 20-40 minutes while the tracer travels through your bloodstream.  Your heart activity will be monitored with an electrocardiogram (ECG).  You will lie down on an exam  table.  Images of your heart will be taken for about 15-20 minutes.  You may also have a stress test. For this test, one of the following may be done: ? You will exercise on a treadmill or stationary bike. While you exercise, your heart's activity will be monitored with an ECG, and your blood pressure will be checked. ? You will be given medicines that will increase blood flow to parts of your heart. This is done if you are unable to exercise.  When blood flow to your heart has peaked, a tracer will again be injected through the IV.  After 20-40 minutes, you will get back on the exam table and have more images taken of your heart.  Depending on the type of tracer used, scans may need  to be repeated 3-4 hours later.  Your IV line will be removed when the procedure is over. The procedure may vary among health care providers and hospitals. What happens after the procedure?  Unless your health care provider tells you otherwise, you may return to your normal schedule, including diet, activities, and medicines.  Unless your health care provider tells you otherwise, you may increase your fluid intake. This will help to flush the contrast dye from your body. Drink enough fluid to keep your urine pale yellow.  Ask your health care provider, or the department that is doing the test: ? When will my results be ready? ? How will I get my results? Summary  A cardiac nuclear scan measures the blood flow to the heart when a person is resting and when he or she is exercising.  Tell your health care provider if you are pregnant.  Before the procedure, ask your health care provider about changing or stopping your regular medicines. This is especially important if you are taking diabetes medicines or blood thinners.  After the procedure, unless your health care provider tells you otherwise, increase your fluid intake. This will help flush the contrast dye from your body.  After the procedure, unless  your health care provider tells you otherwise, you may return to your normal schedule, including diet, activities, and medicines. This information is not intended to replace advice given to you by your health care provider. Make sure you discuss any questions you have with your health care provider. Document Revised: 09/27/2017 Document Reviewed: 09/27/2017 Elsevier Patient Education  [place].

## 2019-09-14 NOTE — Progress Notes (Signed)
Cardiology Office Note:    Date:  09/14/2019   ID:  Tabitha [name], DOB June 14, 1970, MRN [id number]  PCP:  Particia Nearing, PA-C  Cardiologist:  No primary care provider on file.  Electrophysiologist:  None   Referring MD: Particia Nearing,*   Chief Complaint  Patient presents with  . New Patient (Initial Visit)    pt gets SOB when walking w/ back pain/ dizziness when sitting up to/from laying down/ feels like head is pounding when dizzy and when leaning over head pounds/ swelling/ fatigue/ difficulty breathing. Meds verbally reviewed w/ pt.    History of Present Illness:    Tabitha [name] is a 49 y.o. female with a hx of anxiety, asthma, bipolar disorder, hypertension, diabetes, hyperlipidemia, schizoaffective disorder, sleep apnea who presents due to chest pain, shortness of breath and dizziness.  Patient states having worsening symptoms of shortness of breath with exertion over the past 2 months.  Usual activities like walking around her house causes her to be short of breath.  Symptoms are also associated with chest tightness which she rates as a 3 out of 10 in severity.  Symptoms resolve with rest.  Chest tightness is located in the central chest and sometimes her back.  She also notes ongoing dizziness usually when she sits up from a lying position or stands from a sitting position.  Occasionally when she bends down to tie her shoe and rises up she is felt dizzy with a feeling of the room spinning.  She denies any history of heart disease, denies smoking.  Past Medical History:  Diagnosis Date  . Allergic rhinitis   . Anxiety   . Asthma   . Bipolar disorder (HCC)   . Depression   . Diabetes mellitus without complication (HCC)   . GERD (gastroesophageal reflux disease)   . Hyperlipidemia   . Mild sleep apnea   . OCD (obsessive compulsive disorder)   . Palpitations   . Paranoia (HCC)   . Schizoaffective disorder (HCC)   . Sleep apnea     Past Surgical  History:  Procedure Laterality Date  . CESAREAN SECTION  2006/2008  . CHOLECYSTECTOMY  2006    Current Medications: Current Meds  Medication Sig  . albuterol (PROAIR HFA) 108 (90 Base) MCG/ACT inhaler Inhale 2 puffs into the lungs every 6 (six) hours as needed for wheezing or shortness of breath.  Marland Kitchen amantadine (SYMMETREL) 100 MG capsule Take 1 capsule (100 mg total) by mouth 2 (two) times daily.  Marland Kitchen atorvastatin (LIPITOR) 20 MG tablet Take 1 tab daily  . clonazePAM (KLONOPIN) 0.5 MG tablet Take 0.5 tablets (0.25 mg total) by mouth 2 (two) times daily as needed for anxiety.  . divalproex (DEPAKOTE) 500 MG DR tablet Take 1 tablet (500 mg total) by mouth 2 (two) times daily.  Marland Kitchen doxycycline (VIBRAMYCIN) 50 MG capsule TAKE 1 CAPSULE(50 MG) BY MOUTH EVERY MORNING  . fluconazole (DIFLUCAN) 150 MG tablet Take 1 tablet (150 mg total) by mouth every other day.  . fluticasone (FLOVENT HFA) 110 MCG/ACT inhaler Inhale 1 puff into the lungs 2 (two) times daily.  Marland Kitchen gabapentin (NEURONTIN) 600 MG tablet Take 1 tablet (600 mg total) by mouth 3 (three) times daily.  Marland Kitchen glucose blood (ACCU-CHEK AVIVA PLUS) test strip 1 each by Other route as needed for other. Use as instructed  . hydrochlorothiazide (HYDRODIURIL) 12.5 MG tablet Take 1 tablet (12.5 mg total) by mouth daily.  . insulin degludec (TRESIBA FLEXTOUCH) 100 UNIT/ML FlexTouch  Pen Inject 0.4 mLs (40 Units total) into the skin daily.  . Insulin Pen Needle (BD PEN NEEDLE NANO U/F) 32G X 4 MM MISC 1 each by Does not apply route daily.  Marland Kitchen MELATONIN PO Take 30 mg by mouth. Taking 1 tablet daily at night  . meloxicam (MOBIC) 15 MG tablet TAKE 1 TABLET(15 MG) BY MOUTH DAILY  . metFORMIN (GLUCOPHAGE) 500 MG tablet TAKE 2 TABLETS(1000 MG) BY MOUTH TWICE DAILY WITH A MEAL  . nitrofurantoin, macrocrystal-monohydrate, (MACROBID) 100 MG capsule Take 1 capsule (100 mg total) by mouth 2 (two) times daily.  Marland Kitchen NOVOTWIST 32G X 5 MM MISC USE WITH LEVEMIR PEN BID  .  omeprazole (PRILOSEC) 10 MG capsule TAKE 1 CAPSULE BY MOUTH DAILY.  Marland Kitchen perphenazine (TRILAFON) 4 MG tablet Take one tablet at bedtime  . pioglitazone (ACTOS) 45 MG tablet TAKE 1 TABLET(45 MG) BY MOUTH DAILY  . Semaglutide,0.25 or 0.5MG /DOS, (OZEMPIC, 0.25 OR 0.5 MG/DOSE,) 2 MG/1.5ML SOPN Inject 0.25 mg into the skin once a week.  . Vilazodone HCl (VIIBRYD) 40 MG TABS Take 1 tablet (40 mg total) by mouth every morning.   Current Facility-Administered Medications for the 09/14/19 encounter (Office Visit) with Debbe Odea, MD  Medication  . paliperidone (INVEGA SUSTENNA) injection 234 mg     Allergies:   Abilify [aripiprazole]   Social History   Socioeconomic History  . Marital status: Married    Spouse name: kevin  . Number of children: 2  . Years of education: Not on file  . Highest education level: Bachelor's degree (e.g., BA, AB, BS)  Occupational History  . Not on file  Tobacco Use  . Smoking status: Never Smoker  . Smokeless tobacco: Never Used  Substance and Sexual Activity  . Alcohol use: No    Alcohol/week: 0.0 standard drinks  . Drug use: No  . Sexual activity: Yes    Partners: Male    Birth control/protection: None, Surgical  Other Topics Concern  . Not on file  Social History Narrative  . Not on file   Social Determinants of Health   Financial Resource Strain:   . Difficulty of Paying Living Expenses:   Food Insecurity:   . Worried About Programme researcher, broadcasting/film/video in the Last Year:   . Barista in the Last Year:   Transportation Needs:   . Freight forwarder (Medical):   Marland Kitchen Lack of Transportation (Non-Medical):   Physical Activity:   . Days of Exercise per Week:   . Minutes of Exercise per Session:   Stress:   . Feeling of Stress :   Social Connections:   . Frequency of Communication with Friends and Family:   . Frequency of Social Gatherings with Friends and Family:   . Attends Religious Services:   . Active Member of Clubs or Organizations:    . Attends Banker Meetings:   Marland Kitchen Marital Status:      Family History: The patient's family history includes Diabetes in her brother, father, and mother; Hyperlipidemia in her father; Lupus in her mother.  ROS:   Please see the history of present illness.     All other systems reviewed and are negative.  EKGs/Labs/Other Studies Reviewed:    The following studies were reviewed today:   EKG:  EKG is  ordered today.  The ekg ordered today demonstrates normal sinus rhythm, normal ECG.  Recent Labs: 09/07/2019: ALT 16; BUN 14; Creatinine, Ser 0.90; Potassium 4.1; Sodium 137  Recent  Lipid Panel    Component Value Date/Time   CHOL 138 09/07/2019 1657   CHOL 176 04/23/2017 1341   TRIG 202 (H) 09/07/2019 1657   TRIG 253 (H) 04/23/2017 1341   HDL 55 09/07/2019 1657   CHOLHDL 2.9 09/26/2015 1154   VLDL 51 (H) 04/23/2017 1341   LDLCALC 51 09/07/2019 1657    Physical Exam:    VS:  BP 120/76 (BP Location: Right Arm, Patient Position: Sitting, Cuff Size: Large)   Pulse 83   Ht 5\' 2"  (1.575 m)   Wt 236 lb (107 kg)   SpO2 99%   BMI 43.16 kg/m     Wt Readings from Last 3 Encounters:  09/14/19 236 lb (107 kg)  09/07/19 233 lb (105.7 kg)  01/19/19 234 lb (106.1 kg)     GEN:  Well nourished, well developed in no acute distress HEENT: Normal NECK: No JVD; No carotid bruits LYMPHATICS: No lymphadenopathy CARDIAC: RRR, no murmurs, rubs, gallops RESPIRATORY:  Clear to auscultation without rales, wheezing or rhonchi  ABDOMEN: Soft, non-tender, non-distended MUSCULOSKELETAL:  No edema; No deformity  SKIN: Warm and dry NEUROLOGIC:  Alert and oriented x 3 PSYCHIATRIC:  Normal affect   ASSESSMENT:    1. Chest pain of uncertain etiology   2. Dyspnea on exertion   3. Morbid obesity ([place])   4. Essential hypertension   5. Dizziness   6. Precordial pain   7. Pre-procedure lab exam    PLAN:    In order of problems listed above:  1. Patient with typical chest pain.   She has risk factors of hypertension, hyperlipidemia, diabetes, obesity.  We will get a Myoview to evaluate for ischemia. 2. Patient with dyspnea on exertion, get echocardiogram to evaluate LV dysfunction.  Her symptoms could also be secondary to deconditioning/obesity 3. Patient is morbidly obese, low-calorie diet, weight loss advised. 4. History of hypertension, blood pressure well controlled.  Continue current BP meds. 5. Patient with dizziness related with positioning.  Orthostatic vitals in the office today did not reveal any evidence for orthostasis, she has symptoms of dizziness with sitting up from a lying position and standing from a seated position.  Symptoms more consistent with positional vertigo.  Recommend she follows up with PCP regarding trial of medication such as Antivert.  Follow-up after echocardiogram and Myoview.  Total encounter time 65 minutes  Greater than 50% was spent in counseling and coordination of care with the patient Time spent educating patient on etiologies of dizziness, etiologies for chest pain, indications for stress testing and diagnostic testing, weight loss advised.  This note was generated in part or whole with voice recognition software. Voice recognition is usually quite accurate but there are transcription errors that can and very often do occur. I apologize for any typographical errors that were not detected and corrected.  Medication Adjustments/Labs and Tests Ordered: Current medicines are reviewed at length with the patient today.  Concerns regarding medicines are outlined above.  Orders Placed This Encounter  Procedures  . NM Myocar Multi W/Spect W/Wall Motion / EF  . Pregnancy, urine  . EKG 12-Lead  . ECHOCARDIOGRAM COMPLETE   No orders of the defined types were placed in this encounter.   Patient Instructions  Medication Instructions:  No changes. *If you need a refill on your cardiac medications before your next appointment, please  call your pharmacy*   Lab Work: You will need a urine pregnancy test the morning of your stress test. Please arrive 45 minutes prior  to your scheduled appointment time. This will be done at the medical mall entrance. Please go to the first desk on the right passed the screening table.   If you have labs (blood work) drawn today and your tests are completely normal, you will receive your results only by: Marland Kitchen MyChart Message (if you have MyChart) OR . A paper copy in the mail If you have any lab test that is abnormal or we need to change your treatment, we will call you to review the results.   Testing/Procedures: Your physician has requested that you have a lexiscan myoview.  [facility] MYOVIEW  Your caregiver has ordered a Stress Test with nuclear imaging. The purpose of this test is to evaluate the blood supply to your heart muscle. This procedure is referred to as a "Non-Invasive Stress Test." This is because other than having an IV started in your vein, nothing is inserted or "invades" your body. Cardiac stress tests are done to find areas of poor blood flow to the heart by determining the extent of coronary artery disease (CAD). Some patients exercise on a treadmill, which naturally increases the blood flow to your heart, while others who are  unable to walk on a treadmill due to physical limitations have a pharmacologic/chemical stress agent called Lexiscan . This medicine will mimic walking on a treadmill by temporarily increasing your coronary blood flow.   Please note: these test may take anywhere between 2-4 hours to complete  PLEASE REPORT TO [facility] MEDICAL MALL ENTRANCE  THE VOLUNTEERS AT THE FIRST DESK WILL DIRECT YOU WHERE TO GO  Date of Procedure:_____________________________________  Arrival Time for Procedure:______________________________  Instructions regarding medication:   _X_ : Hold diabetes medication morning of procedure (all oral and insulin). You can take a half of your  regular insulin dose the evening prior to your test.  _X___:  Hold other medications as follows: Hold your HCTZ the morning of your test.   PLEASE NOTIFY THE OFFICE AT LEAST 24 HOURS IN ADVANCE IF YOU ARE UNABLE TO KEEP YOUR APPOINTMENT.  [phone] AND  PLEASE NOTIFY NUCLEAR MEDICINE AT [facility] AT LEAST 24 HOURS IN ADVANCE IF YOU ARE UNABLE TO KEEP YOUR APPOINTMENT. [phone]  How to prepare for your Myoview test:  1. Do not eat or drink after midnight 2. No caffeine for 24 hours prior to test 3. No smoking 24 hours prior to test. 4. Your medication may be taken with water.  If your doctor stopped a medication because of this test, do not take that medication. 5. Ladies, please do not wear dresses.  Skirts or pants are appropriate. Please wear a short sleeve shirt. 6. No perfume, cologne or lotion. 7. Wear comfortable walking shoes. No heels!         Your physician has requested that you have an echocardiogram. Echocardiography is a painless test that uses sound waves to create images of your heart. It provides your doctor with information about the size and shape of your heart and how well your heart's chambers and valves are working. This procedure takes approximately one hour. There are no restrictions for this procedure.     Follow-Up: At [facility], you and your health needs are our priority.  As part of our continuing mission to provide you with exceptional heart care, we have created designated Provider Care Teams.  These Care Teams include your primary Cardiologist (physician) and Advanced Practice Providers (APPs -  Physician Assistants and Nurse Practitioners) who all work together to provide  you with the care you need, when you need it.  We recommend signing up for the patient portal called "MyChart".  Sign up information is provided on this After Visit Summary.  MyChart is used to connect with patients for Virtual Visits (Telemedicine).  Patients are able to view  lab/test results, encounter notes, upcoming appointments, etc.  Non-urgent messages can be sent to your provider as well.   To learn more about what you can do with MyChart, go to [email or web address].    Your next appointment:   After your testing is completed.    The format for your next appointment:   In Person  Provider:   Debbe Odea, MD   Other Instructions   Echocardiogram An echocardiogram is a procedure that uses painless sound waves (ultrasound) to produce an image of the heart. Images from an echocardiogram can provide important information about:  Signs of coronary artery disease (CAD).  Aneurysm detection. An aneurysm is a weak or damaged part of an artery wall that bulges out from the normal force of blood pumping through the body.  Heart size and shape. Changes in the size or shape of the heart can be associated with certain conditions, including heart failure, aneurysm, and CAD.  Heart muscle function.  Heart valve function.  Signs of a past heart attack.  Fluid buildup around the heart.  Thickening of the heart muscle.  A tumor or infectious growth around the heart valves. Tell a health care provider about:  Any allergies you have.  All medicines you are taking, including vitamins, herbs, eye drops, creams, and over-the-counter medicines.  Any blood disorders you have.  Any surgeries you have had.  Any medical conditions you have.  Whether you are pregnant or may be pregnant. What are the risks? Generally, this is a safe procedure. However, problems may occur, including:  Allergic reaction to dye (contrast) that may be used during the procedure. What happens before the procedure? No specific preparation is needed. You may eat and drink normally. What happens during the procedure?   An IV tube may be inserted into one of your veins.  You may receive contrast through this tube. A contrast is an injection that improves the quality of  the pictures from your heart.  A gel will be applied to your chest.  A wand-like tool (transducer) will be moved over your chest. The gel will help to transmit the sound waves from the transducer.  The sound waves will harmlessly bounce off of your heart to allow the heart images to be captured in real-time motion. The images will be recorded on a computer. The procedure may vary among health care providers and hospitals. What happens after the procedure?  You may return to your normal, everyday life, including diet, activities, and medicines, unless your health care provider tells you not to do that. Summary  An echocardiogram is a procedure that uses painless sound waves (ultrasound) to produce an image of the heart.  Images from an echocardiogram can provide important information about the size and shape of your heart, heart muscle function, heart valve function, and fluid buildup around your heart.  You do not need to do anything to prepare before this procedure. You may eat and drink normally.  After the echocardiogram is completed, you may return to your normal, everyday life, unless your health care provider tells you not to do that. This information is not intended to replace advice given to you by your health care provider. Make  sure you discuss any questions you have with your health care provider. Document Revised: 08/04/2018 Document Reviewed: 05/16/2016 Elsevier Patient Education  2020 Elsevier Inc.   Cardiac Nuclear Scan A cardiac nuclear scan is a test that measures blood flow to the heart when a person is resting and when he or she is exercising. The test looks for problems such as:  Not enough blood reaching a portion of the heart.  The heart muscle not working normally. You may need this test if:  You have heart disease.  You have had abnormal lab results.  You have had heart surgery or a balloon procedure to open up blocked arteries (angioplasty).  You have  chest pain.  You have shortness of breath. In this test, a radioactive dye (tracer) is injected into your bloodstream. After the tracer has traveled to your heart, an imaging device is used to measure how much of the tracer is absorbed by or distributed to various areas of your heart. This procedure is usually done at a hospital and takes 2-4 hours. Tell a health care provider about:  Any allergies you have.  All medicines you are taking, including vitamins, herbs, eye drops, creams, and over-the-counter medicines.  Any problems you or family members have had with anesthetic medicines.  Any blood disorders you have.  Any surgeries you have had.  Any medical conditions you have.  Whether you are pregnant or may be pregnant. What are the risks? Generally, this is a safe procedure. However, problems may occur, including:  Serious chest pain and heart attack. This is only a risk if the stress portion of the test is done.  Rapid heartbeat.  Sensation of warmth in your chest. This usually passes quickly.  Allergic reaction to the tracer. What happens before the procedure?  Ask your health care provider about changing or stopping your regular medicines. This is especially important if you are taking diabetes medicines or blood thinners.  Follow instructions from your health care provider about eating or drinking restrictions.  Remove your jewelry on the day of the procedure. What happens during the procedure?  An IV will be inserted into one of your veins.  Your health care provider will inject a small amount of radioactive tracer through the IV.  You will wait for 20-40 minutes while the tracer travels through your bloodstream.  Your heart activity will be monitored with an electrocardiogram (ECG).  You will lie down on an exam table.  Images of your heart will be taken for about 15-20 minutes.  You may also have a stress test. For this test, one of the following may be  done: ? You will exercise on a treadmill or stationary bike. While you exercise, your heart's activity will be monitored with an ECG, and your blood pressure will be checked. ? You will be given medicines that will increase blood flow to parts of your heart. This is done if you are unable to exercise.  When blood flow to your heart has peaked, a tracer will again be injected through the IV.  After 20-40 minutes, you will get back on the exam table and have more images taken of your heart.  Depending on the type of tracer used, scans may need to be repeated 3-4 hours later.  Your IV line will be removed when the procedure is over. The procedure may vary among health care providers and hospitals. What happens after the procedure?  Unless your health care provider tells you otherwise, you may  return to your normal schedule, including diet, activities, and medicines.  Unless your health care provider tells you otherwise, you may increase your fluid intake. This will help to flush the contrast dye from your body. Drink enough fluid to keep your urine pale yellow.  Ask your health care provider, or the department that is doing the test: ? When will my results be ready? ? How will I get my results? Summary  A cardiac nuclear scan measures the blood flow to the heart when a person is resting and when he or she is exercising.  Tell your health care provider if you are pregnant.  Before the procedure, ask your health care provider about changing or stopping your regular medicines. This is especially important if you are taking diabetes medicines or blood thinners.  After the procedure, unless your health care provider tells you otherwise, increase your fluid intake. This will help flush the contrast dye from your body.  After the procedure, unless your health care provider tells you otherwise, you may return to your normal schedule, including diet, activities, and medicines. This information is  not intended to replace advice given to you by your health care provider. Make sure you discuss any questions you have with your health care provider. Document Revised: 09/27/2017 Document Reviewed: 09/27/2017 Elsevier Patient Education  2020 [employer]      Signed, Debbe OdeaBrian Agbor-Etang, MD  09/14/2019 1:16 PM    [facility]

## 2019-09-18 ENCOUNTER — Encounter: Payer: Self-pay | Admitting: Family Medicine

## 2019-09-21 ENCOUNTER — Encounter: Payer: Self-pay | Admitting: Nurse Practitioner

## 2019-09-21 ENCOUNTER — Telehealth (INDEPENDENT_AMBULATORY_CARE_PROVIDER_SITE_OTHER): Payer: Medicare Other | Admitting: Nurse Practitioner

## 2019-09-21 VITALS — HR 80 | Temp 97.8°F | Wt 232.5 lb

## 2019-09-21 DIAGNOSIS — R21 Rash and other nonspecific skin eruption: Secondary | ICD-10-CM | POA: Insufficient documentation

## 2019-09-21 MED ORDER — TRIAMCINOLONE ACETONIDE 0.1 % EX CREA
1.0000 | TOPICAL_CREAM | Freq: Two times a day (BID) | CUTANEOUS | 0 refills | Status: DC
Start: 2019-09-21 — End: 2019-09-26

## 2019-09-21 MED ORDER — PREDNISONE 10 MG PO TABS: ORAL_TABLET | ORAL | 0 refills | Status: DC

## 2019-09-21 NOTE — Progress Notes (Signed)
Pulse 80   Temp 97.8 F (36.6 C) (Oral)   Wt 232 lb 8 oz (105.5 kg)   BMI 42.52 kg/m    Subjective:    Patient ID: Tabitha [name], female    DOB: Jun 14, 1970, 49 y.o.   MRN: [id number]  HPI: Tabitha [name] is a 49 y.o. female  Chief Complaint  Patient presents with  . Rash    pt states she has a itchy rash that is spreading all over her body, states she was around poison ivy but was about 2 feet from it a few days ago.     . This visit was completed via MyChart due to the restrictions of the COVID-19 pandemic. All issues as above were discussed and addressed. Physical exam was done as above through visual confirmation on MyChart. If it was felt that the patient should be evaluated in the office, they were directed there. The patient verbally consented to this visit. . Location of the patient: home . Location of the provider: home . Those involved with this call:  . Provider: Marnee Guarneri, DNP . CMA: Yvonna Alanis, CMA . Front Desk/Registration: Don Perking  . Time spent on call: 15 minutes with patient face to face via video conference. More than 50% of this time was spent in counseling and coordination of care. 10 minutes total spent in review of patient's record and preparation of their chart.  . I verified patient identity using two factors (patient name and date of birth). Patient consents verbally to being seen via telemedicine visit today.    RASH She believes she has poison ivy, started about 2 days ago.  Had been a little ways from it and was picking weeds.  Initially started to her left eye and then moved to right eye.  Now is present to arms, legs, neck, and stomach.  Has had poison ivy before, but not to this level and she reports rash appears similar.  She does endorse some blurry vision to left eye, but no pain.  [facility] is her eye care provider, but has not seen in some time.   Duration:  days  Location: trunk, face, hands, arms and legs   Itching: yes Burning: yes Redness: yes Oozing: no Scaling: no Blisters: yes to hand and left forearm Painful: a little bit Fevers: no Change in detergents/soaps/personal care products: no Recent illness: no Recent travel:no History of same: yes Context: fluctuating Alleviating factors: Ibuprofen and hydrocortisone cream Treatments attempted:Ibuprofen Shortness of breath: no  Throat/tongue swelling: no Myalgias/arthralgias: no  Relevant past medical, surgical, family and social history reviewed and updated as indicated. Interim medical history since our last visit reviewed. Allergies and medications reviewed and updated.  Review of Systems  Constitutional: Negative for activity change, appetite change, diaphoresis, fatigue and fever.  Eyes: Positive for redness, itching and visual disturbance (mild blurry left side).  Respiratory: Negative for cough, chest tightness and shortness of breath.   Cardiovascular: Negative for chest pain, palpitations and leg swelling.  Gastrointestinal: Negative.   Skin: Positive for rash.  Neurological: Negative.   Psychiatric/Behavioral: Negative.     Per HPI unless specifically indicated above     Objective:    Pulse 80   Temp 97.8 F (36.6 C) (Oral)   Wt 232 lb 8 oz (105.5 kg)   BMI 42.52 kg/m   Wt Readings from Last 3 Encounters:  09/21/19 232 lb 8 oz (105.5 kg)  09/14/19 236 lb (107 kg)  09/07/19 233 lb (  105.7 kg)    Physical Exam Vitals and nursing note reviewed.  Constitutional:      General: She is awake. She is not in acute distress.    Appearance: She is well-developed. She is not ill-appearing.  HENT:     Head: Normocephalic.     Right Ear: Hearing normal.     Left Ear: Hearing normal.  Eyes:     General: Lids are normal.        Right eye: No discharge.        Left eye: No discharge.     Comments: Moderate erythema, rash, around exterior left eye with swelling (only able to open eyelid slight amount).  No blisters  noted to rash area.  Pulmonary:     Effort: Pulmonary effort is normal. No accessory muscle usage or respiratory distress.  Musculoskeletal:     Cervical back: Normal range of motion.  Skin:    Findings: Rash present.     Comments: Able to only view face and arms due to virtual.  Noted moderate erythema around exterior left eye with no vesicles + 2 small round areas of erythema under right eye without vesicles.  Left arm with linear pattern area of erythema with linear blister, intact.  Right arm with scattered areas of linear pattern erythema with no scaling or vesicles.    Neurological:     Mental Status: She is alert and oriented to person, place, and time.  Psychiatric:        Attention and Perception: Attention normal.        Mood and Affect: Mood normal.        Behavior: Behavior normal. Behavior is cooperative.        Thought Content: Thought content normal.        Judgment: Judgment normal.     Results for orders placed or performed in visit on 09/07/19  Microscopic Examination   URINE  Result Value Ref Range   WBC, UA 11-30 (A) 0 - 5 /hpf   RBC 3-10 (A) 0 - 2 /hpf   Epithelial Cells (non renal) 0-10 0 - 10 /hpf   Bacteria, UA Few (A) None seen/Few   Yeast, UA Present None seen  Urine Culture, Reflex   URINE  Result Value Ref Range   Urine Culture, Routine Final report    Organism ID, Bacteria Comment   UA/M w/rflx Culture, Routine (STAT)   Specimen: Urine   URINE  Result Value Ref Range   Specific Gravity, UA 1.010 1.005 - 1.030   pH, UA 5.5 5.0 - 7.5   Color, UA Yellow Yellow   Appearance Ur Clear Clear   Leukocytes,UA 1+ (A) Negative   Protein,UA Negative Negative/Trace   Glucose, UA 3+ (A) Negative   Ketones, UA Trace (A) Negative   RBC, UA Trace (A) Negative   Bilirubin, UA Negative Negative   Urobilinogen, Ur 0.2 0.2 - 1.0 mg/dL   Nitrite, UA Negative Negative   Microscopic Examination See below:    Urinalysis Reflex Comment   Comprehensive metabolic  panel  Result Value Ref Range   Glucose 220 (H) 65 - 99 mg/dL   BUN 14 6 - 24 mg/dL   Creatinine, Ser 5.78 0.57 - 1.00 mg/dL   GFR calc non Af Amer 76 >59 mL/min/1.73   GFR calc Af Amer 88 >59 mL/min/1.73   BUN/Creatinine Ratio 16 9 - 23   Sodium 137 134 - 144 mmol/L   Potassium 4.1 3.5 - 5.2 mmol/L  Chloride 94 (L) 96 - 106 mmol/L   CO2 24 20 - 29 mmol/L   Calcium 9.4 8.7 - 10.2 mg/dL   Total Protein 6.5 6.0 - 8.5 g/dL   Albumin 4.2 3.8 - 4.8 g/dL   Globulin, Total 2.3 1.5 - 4.5 g/dL   Albumin/Globulin Ratio 1.8 1.2 - 2.2   Bilirubin Total 0.2 0.0 - 1.2 mg/dL   Alkaline Phosphatase 90 39 - 117 IU/L   AST 11 0 - 40 IU/L   ALT 16 0 - 32 IU/L  Lipid Panel w/o Chol/HDL Ratio  Result Value Ref Range   Cholesterol, Total 138 100 - 199 mg/dL   Triglycerides 854 (H) 0 - 149 mg/dL   HDL 55 >62 mg/dL   VLDL Cholesterol Cal 32 5 - 40 mg/dL   LDL Chol Calc (NIH) 51 0 - 99 mg/dL  HgB V0J  Result Value Ref Range   Hgb A1c MFr Bld 6.6 (H) 4.8 - 5.6 %   Est. average glucose Bld gHb Est-mCnc 143 mg/dL      Assessment & Plan:   Problem List Items Addressed This Visit      Musculoskeletal and Integument   Rash - Primary    Acute x 2 days to face, arms, legs, trunk, and abdomen.  Due to virtual visit able to visualize briefly face and arms only.  Suspect poison ivy dermatitis based on history and linear appearance of rash.  Concern for area around left eye, placed urgent referral to ophthalmology for further assessment.  Scripts for Prednisone taper and Triamcinolone cream sent.  Recommend she avoid contact with rash areas to prevent further spread and while applying lotion use gloves to apply and immediately remove after complete.  Advise to wash all clothing and bed sheets.  Denies any SOB or CP at this time, recommend if worsening symptoms to immediately go to UC or ER for further assessment.  Return tomorrow or Monday for follow-up.      Relevant Orders   Ambulatory referral to  Ophthalmology      I discussed the assessment and treatment plan with the patient. The patient was provided an opportunity to ask questions and all were answered. The patient agreed with the plan and demonstrated an understanding of the instructions.   The patient was advised to call back or seek an in-person evaluation if the symptoms worsen or if the condition fails to improve as anticipated.   I provided 15+ minutes of time during this encounter.  Follow up plan: Return in about 1 day (around 09/22/2019) for Rash follow-up face to face.

## 2019-09-21 NOTE — Assessment & Plan Note (Signed)
Acute x 2 days to face, arms, legs, trunk, and abdomen.  Due to virtual visit able to visualize briefly face and arms only.  Suspect poison ivy dermatitis based on history and linear appearance of rash.  Concern for area around left eye, placed urgent referral to ophthalmology for further assessment.  Scripts for Prednisone taper and Triamcinolone cream sent.  Recommend she avoid contact with rash areas to prevent further spread and while applying lotion use gloves to apply and immediately remove after complete.  Advise to wash all clothing and bed sheets.  Denies any SOB or CP at this time, recommend if worsening symptoms to immediately go to UC or ER for further assessment.  Return tomorrow or Monday for follow-up.

## 2019-09-21 NOTE — Patient Instructions (Signed)
Rash, Adult  A rash is a change in the color of your skin. A rash can also change the way your skin feels. There are many different conditions and factors that can cause a rash. Follow these instructions at home: The goal of treatment is to stop the itching and keep the rash from spreading. Watch for any changes in your symptoms. Let your doctor know about them. Follow these instructions to help with your condition: Medicine Take or apply over-the-counter and prescription medicines only as told by your doctor. These may include medicines:  To treat red or swollen skin (corticosteroid creams).  To treat itching.  To treat an allergy (oral antihistamines).  To treat very bad symptoms (oral corticosteroids).  Skin care  Put cool cloths (compresses) on the affected areas.  Do not scratch or rub your skin.  Avoid covering the rash. Make sure that the rash is exposed to air as much as possible. Managing itching and discomfort  Avoid hot showers or baths. These can make itching worse. A cold shower may help.  Try taking a bath with: ? Epsom salts. You can get these at your local pharmacy or grocery store. Follow the instructions on the package. ? Baking soda. Pour a small amount into the bath as told by your doctor. ? Colloidal oatmeal. You can get this at your local pharmacy or grocery store. Follow the instructions on the package.  Try putting baking soda paste onto your skin. Stir water into baking soda until it gets like a paste.  Try putting on a lotion that relieves itchiness (calamine lotion).  Keep cool and out of the sun. Sweating and being hot can make itching worse. General instructions   Rest as needed.  Drink enough fluid to keep your pee (urine) pale yellow.  Wear loose-fitting clothing.  Avoid scented soaps, detergents, and perfumes. Use gentle soaps, detergents, perfumes, and other cosmetic products.  Avoid anything that causes your rash. Keep a journal to  help track what causes your rash. Write down: ? What you eat. ? What cosmetic products you use. ? What you drink. ? What you wear. This includes jewelry.  Keep all follow-up visits as told by your doctor. This is important. Contact a doctor if:  You sweat at night.  You lose weight.  You pee (urinate) more than normal.  You pee less than normal, or you notice that your pee is a darker color than normal.  You feel weak.  You throw up (vomit).  Your skin or the whites of your eyes look yellow (jaundice).  Your skin: ? Tingles. ? Is numb.  Your rash: ? Does not go away after a few days. ? Gets worse.  You are: ? More thirsty than normal. ? More tired than normal.  You have: ? New symptoms. ? Pain in your belly (abdomen). ? A fever. ? Watery poop (diarrhea). Get help right away if:  You have a fever and your symptoms suddenly get worse.  You start to feel mixed up (confused).  You have a very bad headache or a stiff neck.  You have very bad joint pains or stiffness.  You have jerky movements that you cannot control (seizure).  Your rash covers all or most of your body. The rash may or may not be painful.  You have blisters that: ? Are on top of the rash. ? Grow larger. ? Grow together. ? Are painful. ? Are inside your nose or mouth.  You have a rash   that: ? Looks like purple pinprick-sized spots all over your body. ? Has a "bull's eye" or looks like a target. ? Is red and painful, causes your skin to peel, and is not from being in the sun too long. Summary  A rash is a change in the color of your skin. A rash can also change the way your skin feels.  The goal of treatment is to stop the itching and keep the rash from spreading.  Take or apply over-the-counter and prescription medicines only as told by your doctor.  Contact a doctor if you have new symptoms or symptoms that get worse.  Keep all follow-up visits as told by your doctor. This is  important. This information is not intended to replace advice given to you by your health care provider. Make sure you discuss any questions you have with your health care provider. Document Revised: 08/05/2018 Document Reviewed: 11/15/2017 Elsevier Patient Education  2020 [employer] Inc.  

## 2019-09-25 ENCOUNTER — Other Ambulatory Visit: Payer: Self-pay | Admitting: Nurse Practitioner

## 2019-09-26 ENCOUNTER — Other Ambulatory Visit: Payer: Self-pay | Admitting: Nurse Practitioner

## 2019-09-26 ENCOUNTER — Other Ambulatory Visit: Payer: Self-pay | Admitting: Family Medicine

## 2019-09-26 NOTE — Telephone Encounter (Signed)
Requested Prescriptions  Pending Prescriptions Disp Refills   albuterol (VENTOLIN HFA) 108 (90 Base) MCG/ACT inhaler [Pharmacy Med Name: ALBUTEROL HFA INH (200 PUFFS)8.5GM] 25.5 g     Sig: INHALE 2 PUFFS INTO THE LUNGS EVERY 6 HOURS AS NEEDED FOR WHEEZING OR SHORTNESS OF BREATH     Pulmonology:  Beta Agonists Failed - 09/26/2019  9:42 PM      Failed - One inhaler should last at least one month. If the patient is requesting refills earlier, contact the patient to check for uncontrolled symptoms.      Passed - Valid encounter within last 12 months    Recent Outpatient Visits          5 days ago Rash   [facility] [place], [place] T, NP   2 weeks ago Controlled type 2 diabetes mellitus without complication, with long-term current use of insulin [facility])   [facility] [place], [place], [place]   7 months ago Acute pain of left knee   [facility] Ro[place] Maser [place], [place]   10 months ago Pure hypercholesterolemia   [facility] [place], [place], [place]   1 year ago Controlled type 2 diabetes mellitus with diabetic neuropathy, with long-term current use of insulin [facility])   [facility], [place], [place]      Future Appointments            In 1 week Maurice March, [place], PA-C [facility]   In 1 month Agbor-Etang, Arlys John, MD [facility], [facility]

## 2019-09-26 NOTE — Telephone Encounter (Signed)
Pt called stating that she still has poison ivy. Please advise.

## 2019-09-26 NOTE — Telephone Encounter (Signed)
Requested medication (s) are due for refill today: no  Requested medication (s) are on the active medication list: yes  Last refill:  09/21/2019  Future visit scheduled: yes  Notes to clinic:   Pt called stating that she still has poison ivy. Please advise  Requested Prescriptions  Pending Prescriptions Disp Refills   triamcinolone cream (KENALOG) 0.1 % [Pharmacy Med Name: TRIAMCINOLONE 0.1% CREAM   30GM] 30 g 0    Sig: APPLY EXTERNALLY TO THE AFFECTED AREA 2 TIMES DAILY. WEAR GLOVES WHEN APPLYING TO RASH AREAS AND AVOID PLACING NEAR EYES      Dermatology:  Corticosteroids Passed - 09/26/2019  8:51 AM      Passed - Valid encounter within last 12 months    Recent Outpatient Visits           5 days ago Rash   [facility] [place], [place] T, NP   2 weeks ago Controlled type 2 diabetes mellitus without complication, with long-term current use of insulin [facility])   [facility] [place], [place], [place]   7 months ago Acute pain of left knee   [facility] Roosvelt Maser [place], [place]   10 months ago Pure hypercholesterolemia   [facility] [place], [place], [place]   1 year ago Controlled type 2 diabetes mellitus with diabetic neuropathy, with long-term current use of insulin [facility])   [facility], [place], [place]       Future Appointments             In 1 week Maurice March, [place], PA-C [facility], [facility]   In 1 month Agbor-Etang, Arlys John, MD [facility], [facility]

## 2019-09-28 ENCOUNTER — Other Ambulatory Visit: Payer: Self-pay

## 2019-09-28 DIAGNOSIS — L719 Rosacea, unspecified: Secondary | ICD-10-CM

## 2019-09-28 MED ORDER — DOXYCYCLINE HYCLATE 50 MG PO CAPS: ORAL_CAPSULE | ORAL | 0 refills | Status: DC

## 2019-09-28 NOTE — Telephone Encounter (Signed)
Direct and personalized fax from pharmacy.   Patient is requesting a new prescription for Doxycycline 50mg  caps.

## 2019-09-29 ENCOUNTER — Other Ambulatory Visit: Payer: Medicare Other

## 2019-10-02 ENCOUNTER — Other Ambulatory Visit: Payer: Self-pay

## 2019-10-02 ENCOUNTER — Ambulatory Visit (INDEPENDENT_AMBULATORY_CARE_PROVIDER_SITE_OTHER): Payer: Medicare Other

## 2019-10-02 DIAGNOSIS — F251 Schizoaffective disorder, depressive type: Secondary | ICD-10-CM

## 2019-10-02 DIAGNOSIS — F429 Obsessive-compulsive disorder, unspecified: Secondary | ICD-10-CM | POA: Diagnosis not present

## 2019-10-02 DIAGNOSIS — F3170 Bipolar disorder, currently in remission, most recent episode unspecified: Secondary | ICD-10-CM | POA: Diagnosis not present

## 2019-10-02 NOTE — Patient Instructions (Signed)
invega sustenna 234mg  was given today right outer gluteal area.   Exp.   lot # [id number] S/N# [id number] GTIN: [id number]  NDC# [id number] pt tolerated well and seems to be in a good mood. Pt talked about her kids and the [place] [id number] to see her parents.   Pt to returned in 28 day for next injection.

## 2019-10-06 ENCOUNTER — Other Ambulatory Visit: Payer: Self-pay | Admitting: Family Medicine

## 2019-10-06 ENCOUNTER — Telehealth: Payer: Self-pay | Admitting: Family Medicine

## 2019-10-06 ENCOUNTER — Ambulatory Visit: Payer: Medicare Other | Admitting: Cardiology

## 2019-10-06 NOTE — Telephone Encounter (Signed)
-----   Message from Marjie Skiff, NP sent at 09/21/2019 11:42 AM EDT ----- Please schedule for follow-up in office tomorrow for face to face with Fleet Contras - would benefit face to face visit due to severity of rash

## 2019-10-06 NOTE — Telephone Encounter (Signed)
Unable to lvm to make this apt/  

## 2019-10-06 NOTE — Telephone Encounter (Signed)
Requested Prescriptions  Pending Prescriptions Disp Refills  . OZEMPIC, 0.25 OR 0.5 MG/DOSE, 2 MG/1.5ML SOPN [Pharmacy Med Name: OZEMPIC 0.25 OR 0.5MG /DOS 1X2MG  PEN] 1.5 mL 1    Sig: INJECT 0.25 MG INTO THE SKIN ONCE A WEEK     Endocrinology:  Diabetes - GLP-1 Receptor Agonists Passed - 10/06/2019  7:00 AM      Passed - HBA1C is between 0 and 7.9 and within 180 days    HB A1C (BAYER DCA - WAIVED)  Date Value Ref Range Status  10/22/2017 6.9 <7.0 % Final    Comment:                                          Diabetic Adult            <7.0                                       Healthy Adult        4.3 - 5.7                                                           (DCCT/NGSP) American Diabetes Association's Summary of Glycemic Recommendations for Adults with Diabetes: Hemoglobin A1c <7.0%. More stringent glycemic goals (A1c <6.0%) may further reduce complications at the cost of increased risk of hypoglycemia.    Hgb A1c MFr Bld  Date Value Ref Range Status  09/07/2019 6.6 (H) 4.8 - 5.6 % Final    Comment:             Prediabetes: 5.7 - 6.4          Diabetes: >6.4          Glycemic control for adults with diabetes: <7.0          Passed - Valid encounter within last 6 months    Recent Outpatient Visits          2 weeks ago Rash   [facility] [place], [place] T, NP   4 weeks ago Controlled type 2 diabetes mellitus without complication, with long-term current use of insulin [facility])   [facility] [place], [place], [place]   7 months ago Acute pain of left knee   [facility] Ro[place] Maser [place], [place]   10 months ago Pure hypercholesterolemia   [facility] [place], [place], [place]   1 year ago Controlled type 2 diabetes mellitus with diabetic neuropathy, with long-term current use of insulin [facility])   [facility] [employer], [place], [place]

## 2019-10-09 ENCOUNTER — Ambulatory Visit: Payer: Medicare Other | Admitting: Family Medicine

## 2019-10-12 ENCOUNTER — Other Ambulatory Visit: Payer: Self-pay

## 2019-10-12 ENCOUNTER — Telehealth: Payer: Medicare Other | Admitting: Psychiatry

## 2019-10-12 ENCOUNTER — Encounter (HOSPITAL_COMMUNITY): Payer: Self-pay | Admitting: Psychiatry

## 2019-10-12 ENCOUNTER — Telehealth: Payer: Self-pay | Admitting: Cardiology

## 2019-10-12 ENCOUNTER — Telehealth (INDEPENDENT_AMBULATORY_CARE_PROVIDER_SITE_OTHER): Payer: Medicare Other | Admitting: Psychiatry

## 2019-10-12 DIAGNOSIS — F251 Schizoaffective disorder, depressive type: Secondary | ICD-10-CM

## 2019-10-12 DIAGNOSIS — F429 Obsessive-compulsive disorder, unspecified: Secondary | ICD-10-CM

## 2019-10-12 MED ORDER — DIVALPROEX SODIUM 500 MG PO DR TAB: 500.0000 mg | DELAYED_RELEASE_TABLET | Freq: Two times a day (BID) | ORAL | 0 refills | Status: DC

## 2019-10-12 MED ORDER — CLONAZEPAM 0.5 MG PO TABS: 0.2500 mg | ORAL_TABLET | Freq: Two times a day (BID) | ORAL | 2 refills | Status: DC | PRN

## 2019-10-12 MED ORDER — GABAPENTIN 600 MG PO TABS: 600.0000 mg | ORAL_TABLET | Freq: Three times a day (TID) | ORAL | 0 refills | Status: DC

## 2019-10-12 MED ORDER — PERPHENAZINE 4 MG PO TABS: ORAL_TABLET | ORAL | 0 refills | Status: DC

## 2019-10-12 MED ORDER — VIIBRYD 40 MG PO TABS: 40.0000 mg | ORAL_TABLET | Freq: Every morning | ORAL | 0 refills | Status: DC

## 2019-10-12 MED ORDER — AMANTADINE HCL 100 MG PO CAPS: 100.0000 mg | ORAL_CAPSULE | Freq: Two times a day (BID) | ORAL | 0 refills | Status: DC

## 2019-10-12 NOTE — Progress Notes (Signed)
BH MD OP Progress Note  Virtual Visit via Video Note  I connected with Tabitha [name] on 10/12/19 at  2:30 PM EDT by a video enabled telemedicine application and verified that I am speaking with the correct person using two identifiers.  Location: Patient: Home Provider: Clinic   I discussed the limitations of evaluation and management by telemedicine and the availability of in person appointments. The patient expressed understanding and agreed to proceed.  I provided 17 minutes of non-face-to-face time during this encounter.      10/12/2019 2:37 PM Tabitha [name]  MRN:  [id number]  Chief Complaint:  "I am doing much better."  HPI: Patient reported that she started taking perphenazine and that has helped her hallucinations and delusions significantly.  She stated that she is also sleeping much better now.  She is not seeing bugs in the kitchen anymore. She also informed that she recently visited her parents and that was also a refreshing event for her and helped her rejuvenate. She denies any issues or concerns at this time.   Visit Diagnosis:    ICD-10-CM   1. Schizoaffective disorder, depressive type (HCC)  F25.1   2. Obsessive-compulsive disorder, unspecified type  F42.9     Past Psychiatric History: Schizoaffective disorder, OCD  Past Medical History:  Past Medical History:  Diagnosis Date  . Allergic rhinitis   . Anxiety   . Asthma   . Bipolar disorder (HCC)   . Depression   . Diabetes mellitus without complication (HCC)   . GERD (gastroesophageal reflux disease)   . Hyperlipidemia   . Mild sleep apnea   . OCD (obsessive compulsive disorder)   . Palpitations   . Paranoia (HCC)   . Schizoaffective disorder (HCC)   . Sleep apnea     Past Surgical History:  Procedure Laterality Date  . CESAREAN SECTION  2006/2008  . CHOLECYSTECTOMY  2006    Family Psychiatric History: denied  Family History:  Family History  Problem Relation Age of Onset  .  Diabetes Father   . Hyperlipidemia Father   . Diabetes Brother   . Lupus Mother   . Diabetes Mother        pre diabetes    Social History:  Social History   Socioeconomic History  . Marital status: Married    Spouse name: kevin  . Number of children: 2  . Years of education: Not on file  . Highest education level: Bachelor's degree (e.g., BA, AB, BS)  Occupational History  . Not on file  Tobacco Use  . Smoking status: Never Smoker  . Smokeless tobacco: Never Used  Vaping Use  . Vaping Use: Never used  Substance and Sexual Activity  . Alcohol use: No    Alcohol/week: 0.0 standard drinks  . Drug use: No  . Sexual activity: Yes    Partners: Male    Birth control/protection: None, Surgical  Other Topics Concern  . Not on file  Social History Narrative  . Not on file   Social Determinants of Health   Financial Resource Strain:   . Difficulty of Paying Living Expenses:   Food Insecurity:   . Worried About Programme researcher, broadcasting/film/video in the Last Year:   . Barista in the Last Year:   Transportation Needs:   . Freight forwarder (Medical):   Marland Kitchen Lack of Transportation (Non-Medical):   Physical Activity:   . Days of Exercise per Week:   . Minutes of Exercise per  Session:   Stress:   . Feeling of Stress :   Social Connections:   . Frequency of Communication with Friends and Family:   . Frequency of Social Gatherings with Friends and Family:   . Attends Religious Services:   . Active Member of Clubs or Organizations:   . Attends Banker Meetings:   Marland Kitchen Marital Status:     Allergies:  Allergies  Allergen Reactions  . Abilify [Aripiprazole] Hives    Metabolic Disorder Labs: Lab Results  Component Value Date   HGBA1C 6.6 (H) 09/07/2019   Lab Results  Component Value Date   PROLACTIN 84.8 (H) 06/01/2017   PROLACTIN 36.2 (H) 03/26/2016   Lab Results  Component Value Date   CHOL 138 09/07/2019   TRIG 202 (H) 09/07/2019   HDL 55 09/07/2019    CHOLHDL 2.9 09/26/2015   VLDL 51 (H) 04/23/2017   LDLCALC 51 09/07/2019   LDLCALC 63 04/28/2018   Lab Results  Component Value Date   TSH 1.290 04/28/2018   TSH 2.570 06/01/2017    Therapeutic Level Labs: No results found for: LITHIUM Lab Results  Component Value Date   VALPROATE 52 10/22/2017   VALPROATE 43 (L) 06/01/2017   No components found for:  CBMZ  Current Medications: Current Outpatient Medications  Medication Sig Dispense Refill  . albuterol (VENTOLIN HFA) 108 (90 Base) MCG/ACT inhaler INHALE 2 PUFFS INTO THE LUNGS EVERY 6 HOURS AS NEEDED FOR WHEEZING OR SHORTNESS OF BREATH 25.5 g 3  . amantadine (SYMMETREL) 100 MG capsule Take 1 capsule (100 mg total) by mouth 2 (two) times daily. 180 capsule 0  . atorvastatin (LIPITOR) 20 MG tablet Take 1 tab daily 90 tablet 1  . clonazePAM (KLONOPIN) 0.5 MG tablet Take 0.5 tablets (0.25 mg total) by mouth 2 (two) times daily as needed for anxiety. 30 tablet 2  . divalproex (DEPAKOTE) 500 MG DR tablet Take 1 tablet (500 mg total) by mouth 2 (two) times daily. 180 tablet 0  . doxycycline (VIBRAMYCIN) 50 MG capsule TAKE 1 CAPSULE(50 MG) BY MOUTH EVERY MORNING 90 capsule 0  . fluconazole (DIFLUCAN) 150 MG tablet Take 1 tablet (150 mg total) by mouth every other day. 3 tablet 0  . fluticasone (FLOVENT HFA) 110 MCG/ACT inhaler Inhale 1 puff into the lungs 2 (two) times daily. 1 Inhaler 11  . gabapentin (NEURONTIN) 600 MG tablet Take 1 tablet (600 mg total) by mouth 3 (three) times daily. 270 tablet 0  . glucose blood (ACCU-CHEK AVIVA PLUS) test strip 1 each by Other route as needed for other. Use as instructed 100 each 12  . hydrochlorothiazide (HYDRODIURIL) 12.5 MG tablet Take 1 tablet (12.5 mg total) by mouth daily. 90 tablet 1  . insulin degludec (TRESIBA FLEXTOUCH) 100 UNIT/ML FlexTouch Pen Inject 0.4 mLs (40 Units total) into the skin daily. 15 mL 1  . Insulin Pen Needle (BD PEN NEEDLE NANO U/F) 32G X 4 MM MISC 1 each by Does not apply  route daily. 100 each 12  . MELATONIN PO Take 30 mg by mouth. Taking 1 tablet daily at night    . meloxicam (MOBIC) 15 MG tablet TAKE 1 TABLET(15 MG) BY MOUTH DAILY 30 tablet 0  . metFORMIN (GLUCOPHAGE) 500 MG tablet TAKE 2 TABLETS(1000 MG) BY MOUTH TWICE DAILY WITH A MEAL 360 tablet 1  . nitrofurantoin, macrocrystal-monohydrate, (MACROBID) 100 MG capsule Take 1 capsule (100 mg total) by mouth 2 (two) times daily. 14 capsule 0  . NOVOTWIST 32G  X 5 MM MISC USE WITH LEVEMIR PEN BID  5  . omeprazole (PRILOSEC) 10 MG capsule TAKE 1 CAPSULE BY MOUTH DAILY. 90 capsule 1  . OZEMPIC, 0.25 OR 0.5 MG/DOSE, 2 MG/1.5ML SOPN INJECT 0.25 MG INTO THE SKIN ONCE A WEEK 1.5 mL 1  . paliperidone (INVEGA SUSTENNA) 234 MG/1.5ML SUSY injection Inject 234 mg into the muscle once for 1 dose. 1.5 mL 11  . perphenazine (TRILAFON) 4 MG tablet Take one tablet at bedtime 90 tablet 0  . pioglitazone (ACTOS) 45 MG tablet TAKE 1 TABLET(45 MG) BY MOUTH DAILY 90 tablet 1  . predniSONE (DELTASONE) 10 MG tablet Take 6 tablets by mouth daily for 2 days, then reduce by 1 tablet every 2 days until gone 42 tablet 0  . triamcinolone cream (KENALOG) 0.1 % APPLY EXTERNALLY TO THE AFFECTED AREA 2 TIMES DAILY. WEAR GLOVES WHEN APPLYING TO RASH AREAS AND AVOID PLACING NEAR EYES 30 g 0  . Vilazodone HCl (VIIBRYD) 40 MG TABS Take 1 tablet (40 mg total) by mouth every morning. 90 tablet 0   Current Facility-Administered Medications  Medication Dose Route Frequency Provider Last Rate Last Admin  . paliperidone (INVEGA SUSTENNA) injection 234 mg  234 mg Intramuscular Q30 days Zena Amos, MD   234 mg at 10/02/19 1545     Musculoskeletal: Strength & Muscle Tone: unable to assess due to telemed visit Gait & Station: unable to assess due to telemed visit Patient leans: unable to assess due to telemed visit  Psychiatric Specialty Exam: ROS  There were no vitals taken for this visit.There is no height or weight on file to calculate BMI.   General Appearance: Well Groomed  Eye Contact:  Good  Speech:  Clear and Coherent and Normal Rate  Volume:  Normal  Mood:  Euthymic  Affect:  Restricted  Thought Process:  Goal Directed, Linear and Descriptions of Associations: Intact  Orientation:  Full (Time, Place, and Person)  Thought Content: Hallucinations: Auditory and Paranoid Ideation - both much improved  Suicidal Thoughts:  No  Homicidal Thoughts:  No  Memory:  Recent;   Good Remote;   Good  Judgement:  Fair  Insight:  Fair  Psychomotor Activity:  Decreased  Concentration:  Concentration: Good and Attention Span: Good  Recall:  Good  Fund of Knowledge: Good  Language: Good  Akathisia:  No  Handed:  Right  AIMS (if indicated): unable to complete due to telemed visit  Assets:  Communication Skills Desire for Improvement Financial Resources/Insurance Housing Social Support  ADL's:  Intact  Cognition: WNL  Sleep:  Good   Screenings: AIMS     [facility] Visit from [facility]/12/2017 in [facility] Office Visit from 07/29/2016 in [facility] Office Visit from 05/06/2016 in [facility] Office Visit from 04/01/2016 in [facility] Office Visit from 03/28/2015 in [facility]  AIMS Total Score 0 0 0 0 0    GAD-7     Office Visit from 04/28/2018 in [facility]  Total GAD-7 Score 8    Mini-Mental     Office Visit from 09/07/2019 in [facility]  Total Score (max 30 points ) 16    PHQ2-9     Clinical Support from 01/19/2019 in [facility] Office Visit from 04/28/2018 in [facility] Clinical Support from 01/13/2018 in [facility] Clinical Support from 01[facility]13/2017 in [facility] Office Visit from 09/24/2015 in [facility]  PHQ-2 Total Score 0 4 2 2 3   PHQ-9 Total Score -- 11 8 12 8        Assessment and Plan: Pt  reported memory loss and auditory hallucinations. She informed provider that she stopped taking perphenazine because she suspected it increased her blood sugar. Provider educated patient on side effects of perphenazine and informed her that it was safe to take. Patient agreeable to retry perphenazine to alleviate symptoms.      1. Schizoaffective disorder, depressive type ([place]) - Continue Invega Sustenna 234 mg qmonthly, next dose due on July 6. - Vilazodone HCl (VIIBRYD) 40 MG TABS; Take 1 tablet (40 mg total) by mouth every morning.  Dispense: 90 tablet; Refill: 1 - gabapentin (NEURONTIN) 600 MG tablet; Take 1 tablet (600 mg total) by mouth 3 (three) times daily.  Dispense: 270 tablet; Refill: 1 - Increase divalproex (DEPAKOTE) 500 MG DR tablet; Take 1 tablet (500 mg total) by mouth BID.  Dispense: 180 tablet; Refill: 1 - amantadine (SYMMETREL) 100 MG capsule; Take 1 capsule (100 mg total) by mouth 2 (two) times daily.  Dispense: 180 capsule; Refill: 1 - Perphenazine 4 mg HS.  2. Obsessive-compulsive disorder, unspecified type  - Vilazodone HCl (VIIBRYD) 40 MG TABS; Take 1 tablet (40 mg total) by mouth every morning.  Dispense: 90 tablet; Refill: 1 - clonazePAM (KLONOPIN) 0.5 MG tablet; Take 0.5 tablets (0.25 mg total) by mouth 2 (two) times daily as needed for anxiety.  Dispense: 30 tablet; Refill: 2     Continue monthly IM Invega injections. Continue same regimen for now. F/up in 2 months.   [place] Crane, MD 10/12/2019, 2:37 PM    I saw and evaluated the pt with NP B. Ronne Binning. [place] Crane, MD 10/12/2019 2:37 PM

## 2019-10-12 NOTE — Telephone Encounter (Signed)
fyi -  Patient decided to do testing at [facility] . She will talk to pcp for new referral.   Cancelled testing per patient request.

## 2019-10-13 ENCOUNTER — Other Ambulatory Visit: Payer: Self-pay | Admitting: Family Medicine

## 2019-10-13 NOTE — Telephone Encounter (Signed)
Requested Prescriptions  Pending Prescriptions Disp Refills  . TRESIBA FLEXTOUCH 100 UNIT/ML FlexTouch Pen [Pharmacy Med Name: TRESIBA FLEXTOUCH PEN ([id number])[id number]] 15 mL 0    Sig: INJECT 0.4 MLS(40 UNITS TOTAL) UNDER THE SKIN DAILY     Endocrinology:  Diabetes - Insulins Passed - 10/13/2019  1:57 PM      Passed - HBA1C is between 0 and 7.9 and within 180 days    HB A1C (BAYER DCA - WAIVED)  Date Value Ref Range Status  10/22/2017 6.9 <7.0 % Final    Comment:                                          Diabetic Adult            <7.0                                       Healthy Adult        4.3 - 5.7                                                           (DCCT/NGSP) American Diabetes Association's Summary of Glycemic Recommendations for Adults with Diabetes: Hemoglobin A1c <7.0%. More stringent glycemic goals (A1c <6.0%) may further reduce complications at the cost of increased risk of hypoglycemia.    Hgb A1c MFr Bld  Date Value Ref Range Status  09/07/2019 6.6 (H) 4.8 - 5.6 % Final    Comment:             Prediabetes: 5.7 - 6.4          Diabetes: >6.4          Glycemic control for adults with diabetes: <7.0          Passed - Valid encounter within last 6 months    Recent Outpatient Visits          3 weeks ago Rash   [facility] [place], [place] T, NP   1 month ago Controlled type 2 diabetes mellitus without complication, with long-term current use of insulin [facility])   [facility] [place], [place], [place]   7 months ago Acute pain of left knee   [facility] [place] Maser [place], [place]   11 months ago Pure hypercholesterolemia   [facility] [place], [place], [place]   1 year ago Controlled type 2 diabetes mellitus with diabetic neuropathy, with long-term current use of insulin [facility])   [facility] [place], [place]      Future Appointments            In 4 days Maurice March, Salley Hews, PA-C  [facility]           patient missed 4 week f/u. Contacted patient and rescheduled appt 10/17/19. Refilled 41ml 0 refills at this time until seen in office.

## 2019-10-17 ENCOUNTER — Other Ambulatory Visit: Payer: Self-pay

## 2019-10-17 ENCOUNTER — Ambulatory Visit (INDEPENDENT_AMBULATORY_CARE_PROVIDER_SITE_OTHER): Payer: Medicare Other | Admitting: Family Medicine

## 2019-10-17 ENCOUNTER — Encounter: Payer: Self-pay | Admitting: Family Medicine

## 2019-10-17 VITALS — BP 100/68 | HR 80 | Temp 98.3°F | Wt 235.0 lb

## 2019-10-17 DIAGNOSIS — E1165 Type 2 diabetes mellitus with hyperglycemia: Secondary | ICD-10-CM

## 2019-10-17 DIAGNOSIS — Z794 Long term (current) use of insulin: Secondary | ICD-10-CM

## 2019-10-17 DIAGNOSIS — R0602 Shortness of breath: Secondary | ICD-10-CM

## 2019-10-17 DIAGNOSIS — R42 Dizziness and giddiness: Secondary | ICD-10-CM

## 2019-10-17 MED ORDER — TRESIBA FLEXTOUCH 100 UNIT/ML ~~LOC~~ SOPN: PEN_INJECTOR | SUBCUTANEOUS | 5 refills | Status: DC

## 2019-10-17 MED ORDER — OZEMPIC (0.25 OR 0.5 MG/DOSE) 2 MG/1.5ML ~~LOC~~ SOPN: 0.5000 mg | PEN_INJECTOR | SUBCUTANEOUS | 5 refills | Status: DC

## 2019-10-17 NOTE — Progress Notes (Signed)
BP 100/68    Pulse 80    Temp 98.3 F (36.8 C) (Oral)    Wt 235 lb (106.6 kg)    SpO2 99%    BMI 42.98 kg/m    Subjective:    Patient ID: Tabitha [name], female    DOB: [birth year], 49 y.o.   MRN: [id number]  HPI: Tabitha [name] is a 49 y.o. female  Chief Complaint  Patient presents with   Diabetes   Hypertension   Here today for DM f/u. Doing 40 units of tresiba at bedtime rather than the levemir which has given her more BS stability without low episodes. Fasting BSs running 106-153. Also d/c'd jardiance (mycotic infections)and replaced with ozempic, which is being tolerated well. No nausea, vomiting. Trying to be more active and work on diet. Feels her SOB is related to deconditioning now that she's been more active since things are improving. Her dizziness has also improved so she cancelled her stress test and echo through Cardiology. Does not wish to do them at this time.   Relevant past medical, surgical, family and social history reviewed and updated as indicated. Interim medical history since our last visit reviewed. Allergies and medications reviewed and updated.  Review of Systems  Per HPI unless specifically indicated above     Objective:    BP 100/68    Pulse 80    Temp 98.3 F (36.8 C) (Oral)    Wt 235 lb (106.6 kg)    SpO2 99%    BMI 42.98 kg/m   Wt Readings from Last 3 Encounters:  10/17/19 235 lb (106.6 kg)  09/21/19 232 lb 8 oz (105.5 kg)  09/14/19 236 lb (107 kg)    Physical Exam Vitals and nursing note reviewed.  Constitutional:      Appearance: Normal appearance. She is not ill-appearing.  HENT:     Head: Atraumatic.  Eyes:     Extraocular Movements: Extraocular movements intact.     Conjunctiva/sclera: Conjunctivae normal.  Cardiovascular:     Rate and Rhythm: Normal rate and regular rhythm.     Heart sounds: Normal heart sounds.  Pulmonary:     Effort: Pulmonary effort is normal.     Breath sounds: Normal breath sounds.  Musculoskeletal:         General: Normal range of motion.     Cervical back: Normal range of motion and neck supple.  Skin:    General: Skin is warm and dry.  Neurological:     Mental Status: She is alert and oriented to person, place, and time.  Psychiatric:        Mood and Affect: Mood normal.        Thought Content: Thought content normal.        Judgment: Judgment normal.     Results for orders placed or performed in visit on 09/07/19  Microscopic Examination   URINE  Result Value Ref Range   WBC, UA 11-30 (A) 0 - 5 /hpf   RBC 3-10 (A) 0 - 2 /hpf   Epithelial Cells (non renal) 0-10 0 - 10 /hpf   Bacteria, UA Few (A) None seen/Few   Yeast, UA Present None seen  Urine Culture, Reflex   URINE  Result Value Ref Range   Urine Culture, Routine Final report    Organism ID, Bacteria Comment   UA/M w/rflx Culture, Routine (STAT)   Specimen: Urine   URINE  Result Value Ref Range   Specific Gravity, UA 1.010 1.005 -  1.030   pH, UA 5.5 5.0 - 7.5   Color, UA Yellow Yellow   Appearance Ur Clear Clear   Leukocytes,UA 1+ (A) Negative   Protein,UA Negative Negative/Trace   Glucose, UA 3+ (A) Negative   Ketones, UA Trace (A) Negative   RBC, UA Trace (A) Negative   Bilirubin, UA Negative Negative   Urobilinogen, Ur 0.2 0.2 - 1.0 mg/dL   Nitrite, UA Negative Negative   Microscopic Examination See below:    Urinalysis Reflex Comment   Comprehensive metabolic panel  Result Value Ref Range   Glucose 220 (H) 65 - 99 mg/dL   BUN 14 6 - 24 mg/dL   Creatinine, Ser 9.52 0.57 - 1.00 mg/dL   GFR calc non Af Amer 76 >59 mL/min/1.73   GFR calc Af Amer 88 >59 mL/min/1.73   BUN/Creatinine Ratio 16 9 - 23   Sodium 137 134 - 144 mmol/L   Potassium 4.1 3.5 - 5.2 mmol/L   Chloride 94 (L) 96 - 106 mmol/L   CO2 24 20 - 29 mmol/L   Calcium 9.4 8.7 - 10.2 mg/dL   Total Protein 6.5 6.0 - 8.5 g/dL   Albumin 4.2 3.8 - 4.8 g/dL   Globulin, Total 2.3 1.5 - 4.5 g/dL   Albumin/Globulin Ratio 1.8 1.2 - 2.2    Bilirubin Total 0.2 0.0 - 1.2 mg/dL   Alkaline Phosphatase 90 39 - 117 IU/L   AST 11 0 - 40 IU/L   ALT 16 0 - 32 IU/L  Lipid Panel w/o Chol/HDL Ratio  Result Value Ref Range   Cholesterol, Total 138 100 - 199 mg/dL   Triglycerides 841 (H) 0 - 149 mg/dL   HDL 55 >32 mg/dL   VLDL Cholesterol Cal 32 5 - 40 mg/dL   LDL Chol Calc (NIH) 51 0 - 99 mg/dL  HgB G4W  Result Value Ref Range   Hgb A1c MFr Bld 6.6 (H) 4.8 - 5.6 %   Est. average glucose Bld gHb Est-mCnc 143 mg/dL      Assessment & Plan:   Problem List Items Addressed This Visit      Endocrine   Type 2 diabetes mellitus with hyperglycemia, with long-term current use of insulin (HCC) - Primary    Excellent response to medication adjustments with favorable home BS readings Will recheck A1C at upcoming f/u and continue to monitor closely at home. Continue current regimen with increase in ozempic to 0.5 mg dose weekly      Relevant Medications   Semaglutide,0.25 or 0.5MG /DOS, (OZEMPIC, 0.25 OR 0.5 MG/DOSE,) 2 MG/1.5ML SOPN   insulin degludec (TRESIBA FLEXTOUCH) 100 UNIT/ML FlexTouch Pen     Other   Morbid obesity (HCC)    Continue efforts toward weight loss. Hoping ozempic will help with this      Relevant Medications   Semaglutide,0.25 or 0.5MG /DOS, (OZEMPIC, 0.25 OR 0.5 MG/DOSE,) 2 MG/1.5ML SOPN   insulin degludec (TRESIBA FLEXTOUCH) 100 UNIT/ML FlexTouch Pen    Other Visit Diagnoses    Dizziness       Resolved, declines further workup for this and her improving SOB   SOB (shortness of breath)       Improving with exercise       Follow up plan: Return in about 5 months (around 03/18/2020) for 6 month f/u.

## 2019-10-17 NOTE — Assessment & Plan Note (Signed)
Continue efforts toward weight loss. Hoping ozempic will help with this

## 2019-10-17 NOTE — Assessment & Plan Note (Addendum)
Excellent response to medication adjustments with favorable home BS readings Will recheck A1C at upcoming f/u and continue to monitor closely at home. Continue current regimen with increase in ozempic to 0.5 mg dose weekly

## 2019-10-19 ENCOUNTER — Other Ambulatory Visit: Payer: Medicare Other

## 2019-10-27 ENCOUNTER — Ambulatory Visit: Payer: Medicare Other | Admitting: Cardiology

## 2019-10-31 ENCOUNTER — Ambulatory Visit (INDEPENDENT_AMBULATORY_CARE_PROVIDER_SITE_OTHER): Payer: Medicare Other

## 2019-10-31 ENCOUNTER — Other Ambulatory Visit: Payer: Self-pay

## 2019-10-31 DIAGNOSIS — F429 Obsessive-compulsive disorder, unspecified: Secondary | ICD-10-CM

## 2019-10-31 DIAGNOSIS — F251 Schizoaffective disorder, depressive type: Secondary | ICD-10-CM

## 2019-10-31 DIAGNOSIS — F3170 Bipolar disorder, currently in remission, most recent episode unspecified: Secondary | ICD-10-CM

## 2019-10-31 NOTE — Progress Notes (Signed)
invega sustenna 234mg  was given todayleft outer gluteal area.   Exp. Date12-01-22   lot #  [id number] S/N# 14-01-22 GTIN: [id number]  NDC# [id number] pt tolerated well and seems to be in a good mood

## 2019-11-09 ENCOUNTER — Other Ambulatory Visit: Payer: Medicare Other

## 2019-11-14 ENCOUNTER — Encounter: Payer: Self-pay | Admitting: Family Medicine

## 2019-11-16 ENCOUNTER — Telehealth: Payer: Self-pay | Admitting: Family Medicine

## 2019-11-16 ENCOUNTER — Other Ambulatory Visit: Payer: Self-pay | Admitting: Psychiatry

## 2019-11-16 DIAGNOSIS — F429 Obsessive-compulsive disorder, unspecified: Secondary | ICD-10-CM

## 2019-11-16 DIAGNOSIS — F251 Schizoaffective disorder, depressive type: Secondary | ICD-10-CM

## 2019-11-16 NOTE — Telephone Encounter (Signed)
Called pt, discussed that I will be leaving the practice as of 12/22/19 but that I would be happy to honor my word and establish her children into the practice and take care of immediate needs until my last day. She could then transfer them to another provider in the practice or decide to go elsewhere at any time. She would like to consider this and call back.

## 2019-11-22 ENCOUNTER — Other Ambulatory Visit: Payer: Self-pay | Admitting: Family Medicine

## 2019-11-22 DIAGNOSIS — Z794 Long term (current) use of insulin: Secondary | ICD-10-CM

## 2019-11-26 ENCOUNTER — Other Ambulatory Visit: Payer: Self-pay | Admitting: Psychiatry

## 2019-11-26 ENCOUNTER — Encounter: Payer: Self-pay | Admitting: Family Medicine

## 2019-11-26 DIAGNOSIS — F251 Schizoaffective disorder, depressive type: Secondary | ICD-10-CM

## 2019-12-02 ENCOUNTER — Other Ambulatory Visit: Payer: Self-pay | Admitting: Family Medicine

## 2019-12-02 DIAGNOSIS — L719 Rosacea, unspecified: Secondary | ICD-10-CM

## 2019-12-02 NOTE — Telephone Encounter (Signed)
Requested Prescriptions  Pending Prescriptions Disp Refills   omeprazole (PRILOSEC) 10 MG capsule [Pharmacy Med Name: OMEPRAZOLE 10MG  CAPSULES] 90 capsule 1    Sig: TAKE 1 CAPSULE BY MOUTH DAILY.     Gastroenterology: Proton Pump Inhibitors Passed - 12/02/2019 12:23 PM      Passed - Valid encounter within last 12 months    Recent Outpatient Visits          1 month ago Type 2 diabetes mellitus with hyperglycemia, with long-term current use of insulin [facility])   [facility] [place], [place], [place]   2 months ago Rash   [facility] [place], [place] T, NP   2 months ago Controlled type 2 diabetes mellitus without complication, with long-term current use of insulin [facility])   [facility], [place], [place]   9 months ago Acute pain of left knee   [facility], [place]   1 year ago Pure hypercholesterolemia   [facility] [place], [place], Salley Hews      Future Appointments            In 3 months [place], Maurice March, PA-C [facility], [facility]            doxycycline (VIBRAMYCIN) 50 MG capsule [Pharmacy Med Name: DOXYCYCLINE HYC 50MG  CAPS] 90 capsule     Sig: TAKE 1 CAPSULE(50 MG) BY MOUTH EVERY MORNING     Off-Protocol Failed - 12/02/2019 12:23 PM      Failed - Medication not assigned to a protocol, review manually.      Passed - Valid encounter within last 12 months    Recent Outpatient Visits          1 month ago Type 2 diabetes mellitus with hyperglycemia, with long-term current use of insulin [facility])   [facility] [place], [place], [place]   2 months ago Rash   [facility] [place], [place] T, NP   2 months ago Controlled type 2 diabetes mellitus without complication, with long-term current use of insulin [facility])   [facility] [place], [place]   9 months ago Acute pain of left knee   [facility] [place], [facility]   1 year ago Pure hypercholesterolemia   [facility] [place], [facility], [place]      Future Appointments            In 3 months Salley Hews, [place], PA-C Maurice March, [facility]

## 2019-12-02 NOTE — Telephone Encounter (Signed)
Requested medication (s) are due for refill today: yes  Requested medication (s) are on the active medication list: yes  Last refill:  09/28/19  Future visit scheduled: yes  Notes to clinic: med not assigned to a protocol, review manually   Requested Prescriptions  Pending Prescriptions Disp Refills   doxycycline (VIBRAMYCIN) 50 MG capsule [Pharmacy Med Name: DOXYCYCLINE HYC 50MG  CAPS] 90 capsule     Sig: TAKE 1 CAPSULE(50 MG) BY MOUTH EVERY MORNING      Off-Protocol Failed - 12/02/2019 12:23 PM      Failed - Medication not assigned to a protocol, review manually.      Passed - Valid encounter within last 12 months    Recent Outpatient Visits           1 month ago Type 2 diabetes mellitus with hyperglycemia, with long-term current use of insulin [facility])   [facility] [place], [place], [place]   2 months ago Rash   [facility] [place], [place] T, NP   2 months ago Controlled type 2 diabetes mellitus without complication, with long-term current use of insulin [facility])   [facility] [place], [place]   9 months ago Acute pain of left knee   [facility], [place]   1 year ago Pure hypercholesterolemia   [facility], [place] March, [place]       Future Appointments             In 3 months [place], [place] March, PA-C [place] He[place], [facility]             Signed Prescriptions Disp Refills   omeprazole (PRILOSEC) 10 MG capsule 90 capsule 1    Sig: TAKE 1 CAPSULE BY MOUTH DAILY.      Gastroenterology: Proton Pump Inhibitors Passed - 12/02/2019 12:23 PM      Passed - Valid encounter within last 12 months    Recent Outpatient Visits           1 month ago Type 2 diabetes mellitus with hyperglycemia, with long-term current use of insulin [facility])   [place], [place], [place]   2 months ago Rash   [facility] [place], [place] T, NP   2  months ago Controlled type 2 diabetes mellitus without complication, with long-term current use of insulin [facility])   [facility] [place], [place]   9 months ago Acute pain of left knee   [facility], [place]   1 year ago Pure hypercholesterolemia   [facility] [place], [place], [place] Hews       Future Appointments             In 3 months [place], [place] March, PA-C [place] He[place], [facility]

## 2019-12-04 ENCOUNTER — Ambulatory Visit: Payer: Medicare Other

## 2019-12-04 ENCOUNTER — Telehealth (HOSPITAL_COMMUNITY): Payer: Self-pay | Admitting: *Deleted

## 2019-12-04 ENCOUNTER — Other Ambulatory Visit: Payer: Self-pay

## 2019-12-04 MED ORDER — INVEGA SUSTENNA 234 MG/1.5ML IM SUSY: 234.0000 mg | PREFILLED_SYRINGE | Freq: Once | INTRAMUSCULAR | 11 refills | Status: DC

## 2019-12-04 NOTE — Addendum Note (Signed)
Addended by: Zena Amos on: 12/04/2019 09:04 AM   Modules accepted: Orders

## 2019-12-04 NOTE — Telephone Encounter (Signed)
Writer called patient to follow up on what medication she called Dr Evelene Croon about. She states she is due for her Gean [place]ch[place] inj today in [place] at 330 and needs the RX for it called into [facility] in [place] [phone], fax-[phone]. Will forward this information to Dr Evelene Croon to call in.

## 2019-12-04 NOTE — Telephone Encounter (Signed)
Prescription sent to her pharmacy

## 2019-12-07 ENCOUNTER — Other Ambulatory Visit: Payer: Self-pay | Admitting: Family Medicine

## 2019-12-07 ENCOUNTER — Encounter (HOSPITAL_COMMUNITY): Payer: Self-pay | Admitting: Psychiatry

## 2019-12-07 ENCOUNTER — Telehealth (INDEPENDENT_AMBULATORY_CARE_PROVIDER_SITE_OTHER): Payer: Medicare Other | Admitting: Psychiatry

## 2019-12-07 ENCOUNTER — Other Ambulatory Visit: Payer: Self-pay

## 2019-12-07 DIAGNOSIS — F429 Obsessive-compulsive disorder, unspecified: Secondary | ICD-10-CM

## 2019-12-07 DIAGNOSIS — F251 Schizoaffective disorder, depressive type: Secondary | ICD-10-CM

## 2019-12-07 DIAGNOSIS — E7800 Pure hypercholesterolemia, unspecified: Secondary | ICD-10-CM

## 2019-12-07 MED ORDER — AMANTADINE HCL 100 MG PO CAPS: 100.0000 mg | ORAL_CAPSULE | Freq: Two times a day (BID) | ORAL | 0 refills | Status: DC

## 2019-12-07 MED ORDER — VIIBRYD 40 MG PO TABS: 40.0000 mg | ORAL_TABLET | Freq: Every day | ORAL | 0 refills | Status: DC

## 2019-12-07 MED ORDER — CLONAZEPAM 0.5 MG PO TABS: 0.2500 mg | ORAL_TABLET | Freq: Two times a day (BID) | ORAL | 2 refills | Status: DC | PRN

## 2019-12-07 MED ORDER — PERPHENAZINE 4 MG PO TABS: ORAL_TABLET | ORAL | 0 refills | Status: DC

## 2019-12-07 MED ORDER — GABAPENTIN 600 MG PO TABS: 600.0000 mg | ORAL_TABLET | Freq: Three times a day (TID) | ORAL | 0 refills | Status: DC

## 2019-12-07 NOTE — Progress Notes (Signed)
BH MD OP Progress Note  Virtual Visit via Video Note  I connected with Tabitha [name] on 12/07/19 at  4:00 PM EDT by a video enabled telemedicine application and verified that I am speaking with the correct person using two identifiers.  Location: Patient: Home Provider: Clinic   I discussed the limitations of evaluation and management by telemedicine and the availability of in person appointments. The patient expressed understanding and agreed to proceed.  I provided 16 minutes of non-face-to-face time during this encounter.     12/07/2019 4:01 PM [name]  MRN:  [id number]  Chief Complaint:  "I wanted to talk to you about my weight."  HPI: Patient reported that lately she has put on a lot of weight and she is concerned about that.  She stated that she wanted to know if she can be prescribed any medication that can help her in curbing her appetite.  She stated that she has been engaging in life emotional eating lately that has resulted in a lot of weight gain. She informed that her hallucinations and delusions are well managed for the most part.  She denies any difficulty pertaining to her sleep. She informed that her kids are starting school next week and she is looking forward to that. Patient was agreeable to the recommendation of discontinuing Depakote as it has been associated increase in appetite.  In her case Depakote was started before perphenazine was added.  Addition of perphenazine to her regimen was effective in controlling her hallucinations better.  Therefore we can safely discontinue Depakote.   Visit Diagnosis:    ICD-10-CM   1. Schizoaffective disorder, depressive type (HCC)  F25.1   2. Obsessive-compulsive disorder, unspecified type  F42.9     Past Psychiatric History: Schizoaffective disorder, OCD  Past Medical History:  Past Medical History:  Diagnosis Date  . Allergic rhinitis   . Anxiety   . Asthma   . Bipolar disorder (HCC)   . Depression    . Diabetes mellitus without complication (HCC)   . GERD (gastroesophageal reflux disease)   . Hyperlipidemia   . Mild sleep apnea   . OCD (obsessive compulsive disorder)   . Palpitations   . Paranoia (HCC)   . Schizoaffective disorder (HCC)   . Sleep apnea     Past Surgical History:  Procedure Laterality Date  . CESAREAN SECTION  2006/2008  . CHOLECYSTECTOMY  2006    Family Psychiatric History: denied  Family History:  Family History  Problem Relation Age of Onset  . Diabetes Father   . Hyperlipidemia Father   . Diabetes Brother   . Lupus Mother   . Diabetes Mother        pre diabetes    Social History:  Social History   Socioeconomic History  . Marital status: Married    Spouse name: kevin  . Number of children: 2  . Years of education: Not on file  . Highest education level: Bachelor's degree (e.g., BA, AB, BS)  Occupational History  . Not on file  Tobacco Use  . Smoking status: Never Smoker  . Smokeless tobacco: Never Used  Vaping Use  . Vaping Use: Never used  Substance and Sexual Activity  . Alcohol use: No    Alcohol/week: 0.0 standard drinks  . Drug use: No  . Sexual activity: Yes    Partners: Male    Birth control/protection: None, Surgical  Other Topics Concern  . Not on file  Social History Narrative  .  Not on file   Social Determinants of Health   Financial Resource Strain:   . Difficulty of Paying Living Expenses:   Food Insecurity:   . Worried About Programme researcher, broadcasting/film/video in the Last Year:   . Barista in the Last Year:   Transportation Needs:   . Freight forwarder (Medical):   Marland Kitchen Lack of Transportation (Non-Medical):   Physical Activity:   . Days of Exercise per Week:   . Minutes of Exercise per Session:   Stress:   . Feeling of Stress :   Social Connections:   . Frequency of Communication with Friends and Family:   . Frequency of Social Gatherings with Friends and Family:   . Attends Religious Services:   . Active  Member of Clubs or Organizations:   . Attends Banker Meetings:   Marland Kitchen Marital Status:     Allergies:  Allergies  Allergen Reactions  . Abilify [Aripiprazole] Hives    Metabolic Disorder Labs: Lab Results  Component Value Date   HGBA1C 6.6 (H) 09/07/2019   Lab Results  Component Value Date   PROLACTIN 84.8 (H) 06/01/2017   PROLACTIN 36.2 (H) 03/26/2016   Lab Results  Component Value Date   CHOL 138 09/07/2019   TRIG 202 (H) 09/07/2019   HDL 55 09/07/2019   CHOLHDL 2.9 09/26/2015   VLDL 51 (H) 04/23/2017   LDLCALC 51 09/07/2019   LDLCALC 63 04/28/2018   Lab Results  Component Value Date   TSH 1.290 04/28/2018   TSH 2.570 06/01/2017    Therapeutic Level Labs: No results found for: LITHIUM Lab Results  Component Value Date   VALPROATE 52 10/22/2017   VALPROATE 43 (L) 06/01/2017   No components found for:  CBMZ  Current Medications: Current Outpatient Medications  Medication Sig Dispense Refill  . albuterol (VENTOLIN HFA) 108 (90 Base) MCG/ACT inhaler INHALE 2 PUFFS INTO THE LUNGS EVERY 6 HOURS AS NEEDED FOR WHEEZING OR SHORTNESS OF BREATH 25.5 g 3  . amantadine (SYMMETREL) 100 MG capsule Take 1 capsule (100 mg total) by mouth 2 (two) times daily. 180 capsule 0  . atorvastatin (LIPITOR) 20 MG tablet TAKE 1 TABLET BY MOUTH DAILY 90 tablet 1  . clonazePAM (KLONOPIN) 0.5 MG tablet Take 0.5 tablets (0.25 mg total) by mouth 2 (two) times daily as needed for anxiety. 30 tablet 2  . divalproex (DEPAKOTE) 500 MG DR tablet Take 1 tablet (500 mg total) by mouth 2 (two) times daily. 180 tablet 0  . doxycycline (VIBRAMYCIN) 50 MG capsule TAKE 1 CAPSULE(50 MG) BY MOUTH EVERY MORNING 90 capsule 0  . fluconazole (DIFLUCAN) 150 MG tablet Take 1 tablet (150 mg total) by mouth every other day. 3 tablet 0  . fluticasone (FLOVENT HFA) 110 MCG/ACT inhaler Inhale 1 puff into the lungs 2 (two) times daily. 1 Inhaler 11  . gabapentin (NEURONTIN) 600 MG tablet TAKE 1 TABLET(600  MG) BY MOUTH THREE TIMES DAILY 270 tablet 0  . glucose blood (ACCU-CHEK AVIVA PLUS) test strip 1 each by Other route as needed for other. Use as instructed 100 each 12  . hydrochlorothiazide (HYDRODIURIL) 12.5 MG tablet TAKE 1 TABLET(12.5 MG) BY MOUTH DAILY 90 tablet 1  . insulin degludec (TRESIBA FLEXTOUCH) 100 UNIT/ML FlexTouch Pen INJECT 0.4 MLS(40 UNITS TOTAL) UNDER THE SKIN DAILY 15 mL 5  . Insulin Pen Needle (BD PEN NEEDLE NANO U/F) 32G X 4 MM MISC 1 each by Does not apply route daily. 100 each  12  . JARDIANCE 25 MG TABS tablet Take 25 mg by mouth daily.    Marland Kitchen. MELATONIN PO Take 30 mg by mouth. Taking 1 tablet daily at night    . meloxicam (MOBIC) 15 MG tablet TAKE 1 TABLET(15 MG) BY MOUTH DAILY 30 tablet 0  . metFORMIN (GLUCOPHAGE) 500 MG tablet TAKE 2 TABLETS(1000 MG) BY MOUTH TWICE DAILY WITH A MEAL 360 tablet 1  . NOVOTWIST 32G X 5 MM MISC USE WITH LEVEMIR PEN BID  5  . omeprazole (PRILOSEC) 10 MG capsule TAKE 1 CAPSULE BY MOUTH DAILY. 90 capsule 1  . paliperidone (INVEGA SUSTENNA) 234 MG/1.5ML SUSY injection Inject 234 mg into the muscle once for 1 dose. 1.5 mL 11  . perphenazine (TRILAFON) 4 MG tablet TAKE 1 TABLET BY MOUTH AT BEDTIME 90 tablet 0  . pioglitazone (ACTOS) 45 MG tablet TAKE 1 TABLET(45 MG) BY MOUTH DAILY 90 tablet 1  . Semaglutide,0.25 or 0.5MG /DOS, (OZEMPIC, 0.25 OR 0.5 MG/DOSE,) 2 MG/1.5ML SOPN Inject 0.375 mLs (0.5 mg total) into the skin once a week. 1 pen 5  . VIIBRYD 40 MG TABS TAKE 1 TABLET(40 MG) BY MOUTH EVERY MORNING 90 tablet 0   Current Facility-Administered Medications  Medication Dose Route Frequency Provider Last Rate Last Admin  . paliperidone (INVEGA SUSTENNA) injection 234 mg  234 mg Intramuscular Q30 days Zena AmosKaur, Tierre Netto, MD   234 mg at 10/31/19 1545     Musculoskeletal: Strength & Muscle Tone: unable to assess due to telemed visit Gait & Station: unable to assess due to telemed visit Patient leans: unable to assess due to telemed  visit  Psychiatric Specialty Exam: ROS  There were no vitals taken for this visit.There is no height or weight on file to calculate BMI.  General Appearance: Fairly Groomed  Eye Contact:  Good  Speech:  Clear and Coherent and Normal Rate  Volume:  Normal  Mood:  Euthymic  Affect:  Restricted  Thought Process:  Goal Directed, Linear and Descriptions of Associations: Intact  Orientation:  Full (Time, Place, and Person)  Thought Content: Hallucinations: Auditory and Paranoid Ideation - both much improved  Suicidal Thoughts:  No  Homicidal Thoughts:  No  Memory:  Recent;   Good Remote;   Good  Judgement:  Fair  Insight:  Fair  Psychomotor Activity:  Decreased  Concentration:  Concentration: Good and Attention Span: Good  Recall:  Good  Fund of Knowledge: Good  Language: Good  Akathisia:  No  Handed:  Right  AIMS (if indicated): unable to complete due to telemed visit  Assets:  Communication Skills Desire for Improvement Financial Resources/Insurance Housing Social Support  ADL's:  Intact  Cognition: WNL  Sleep:  Good   Screenings: AIMS     [facility] Visit [facility]2015 [facility]  AIMS Total Score 0 0 0 0 0    GAD-7     [facility]/2018 [facility]  Total GAD-7 Score 8    Mini-Mental     [facility]  Total Score (max 30 points ) 16    PHQ2-9     [facility]2018 [facility]/13/2017 [facility] Office Visit  from 09/24/2015 in [place]  PHQ-2 Total Score 0 4 2 2 3   PHQ-9 Total Score -- 11 8 12 8        Assessment and Plan: Patient reported excessive weight gain due to impulsive eating and asked if she could be prescribed something to help with that.  Patient was recommended that we discontinue the Depakote as that is usually associated with increase in appetite and resulting weight gain.  Patient was advised that we can continue the rest of the regimen the way it is and touch base in 6 weeks to see if that has helped her weight issues.  Patient was reassured that making healthier dietary choices would also be beneficial and if necessary we can add something in the future to help her curb her appetite.  Patient was agreeable to this recommendation.   1. Schizoaffective disorder, depressive type (HCC) - Continue Invega Sustenna 234 mg qmonthly. - Vilazodone HCl (VIIBRYD) 40 MG TABS; Take 1 tablet (40 mg total) by mouth every morning.  Dispense: 90 tablet; Refill: 1 - gabapentin (NEURONTIN) 600 MG tablet; Take 1 tablet (600 mg total) by mouth 3 (three) times daily.  Dispense: 270 tablet; Refill: 1 - amantadine (SYMMETREL) 100 MG capsule; Take 1 capsule (100 mg total) by mouth 2 (two) times daily.  Dispense: 180 capsule; Refill: 1 - Continue Perphenazine 4 mg HS. - Discontinue Depakote due to increase in appetite and excessive weight gain  2. Obsessive-compulsive disorder, unspecified type  - Vilazodone HCl (VIIBRYD) 40 MG TABS; Take 1 tablet (40 mg total) by mouth every morning.  Dispense: 90 tablet; Refill: 1 - clonazePAM (KLONOPIN) 0.5 MG tablet; Take 0.5 tablets (0.25 mg total) by mouth 2 (two) times daily as needed for anxiety.  Dispense: 30 tablet; Refill: 2   Continue monthly IM Invega injections.  F/up in 6 weeks.   , MD 12/07/2019, 4:01 PM

## 2019-12-14 ENCOUNTER — Other Ambulatory Visit: Payer: Self-pay

## 2019-12-14 ENCOUNTER — Ambulatory Visit (INDEPENDENT_AMBULATORY_CARE_PROVIDER_SITE_OTHER): Payer: Medicare Other

## 2019-12-14 DIAGNOSIS — F429 Obsessive-compulsive disorder, unspecified: Secondary | ICD-10-CM

## 2019-12-14 DIAGNOSIS — F3170 Bipolar disorder, currently in remission, most recent episode unspecified: Secondary | ICD-10-CM

## 2019-12-14 DIAGNOSIS — F251 Schizoaffective disorder, depressive type: Secondary | ICD-10-CM

## 2019-12-14 NOTE — Progress Notes (Signed)
invega sustenna 234mg  was given todayleft outer gluteal area.   Exp. Date12-01-22   lot #  [id number] S/N# [id number] GTIN: [id number]  NDC# [id number] pt tolerated well and seems to be in a good mood

## 2019-12-14 NOTE — Patient Instructions (Signed)
invega sustenna 234mg was given todayleft outer gluteal area.   Exp. Date12-01-22   lot #  [id number] S/N# [id number] GTIN: [id number]  NDC# 50458-564-01 pt tolerated well and seems to be in a good mood 

## 2019-12-15 ENCOUNTER — Other Ambulatory Visit: Payer: Self-pay | Admitting: Family Medicine

## 2019-12-15 DIAGNOSIS — L719 Rosacea, unspecified: Secondary | ICD-10-CM

## 2019-12-15 MED ORDER — DOXYCYCLINE HYCLATE 50 MG PO CAPS: ORAL_CAPSULE | ORAL | 0 refills | Status: DC

## 2019-12-15 NOTE — Telephone Encounter (Signed)
Medication Refill - Medication: doxycycline (VIBRAMYCIN) 50 MG capsule    Preferred Pharmacy (with phone number or street name):  [id number] [place], [place] - [address] AT [facility] [address] [facility] Phone:  [phone]  Fax:  [phone]       Agent: Please be advised that RX refills may take up to 3 business days. We ask that you follow-up with your pharmacy.

## 2019-12-15 NOTE — Telephone Encounter (Signed)
Fleet Contras, your patient -- looks like last fill in June.

## 2019-12-15 NOTE — Telephone Encounter (Signed)
Requested medication (s) are due for refill today - yes- 1 week  Requested medication (s) are on the active medication list -yes  Future visit scheduled -yes  Last refill: 09/28/19  Notes to clinic: Request RF of medication not assigned protocol  Requested Prescriptions  Pending Prescriptions Disp Refills   doxycycline (VIBRAMYCIN) 50 MG capsule 90 capsule 0    Sig: TAKE 1 CAPSULE(50 MG) BY MOUTH EVERY MORNING      Off-Protocol Failed - 12/15/2019  9:55 AM      Failed - Medication not assigned to a protocol, review manually.      Passed - Valid encounter within last 12 months    Recent Outpatient Visits           1 month ago Type 2 diabetes mellitus with hyperglycemia, with long-term current use of insulin [facility])   [facility] [place], [place], [place]   2 months ago Rash   [facility] [place], [place] T, NP   3 months ago Controlled type 2 diabetes mellitus without complication, with long-term current use of insulin [facility])   [facility] [place], [place]   10 months ago Acute pain of left knee   [facility] [place], [place]   1 year ago Pure hypercholesterolemia   [facility] [place], [place]       Future Appointments             In 3 months [place], Dorie Rank, NP [facility], [facility]                Requested Prescriptions  Pending Prescriptions Disp Refills   doxycycline (VIBRAMYCIN) 50 MG capsule 90 capsule 0    Sig: TAKE 1 CAPSULE(50 MG) BY MOUTH EVERY MORNING      Off-Protocol Failed - 12/15/2019  9:55 AM      Failed - Medication not assigned to a protocol, review manually.      Passed - Valid encounter within last 12 months    Recent Outpatient Visits           1 month ago Type 2 diabetes mellitus with hyperglycemia, with long-term current use of insulin [facility])   [facility] [place], [place], [place]   2 months ago Rash    [facility] [place], [place] T, NP   3 months ago Controlled type 2 diabetes mellitus without complication, with long-term current use of insulin [facility])   [facility] [place] M[place], [place]   10 months ago Acute pain of left knee   [facility] [place], [place]   1 year ago Pure hypercholesterolemia   [facility] [place], [place], [place]       Future Appointments             In 3 months Cannady, Dorie Rank, NP [employer] [facility], [facility]

## 2019-12-18 ENCOUNTER — Other Ambulatory Visit: Payer: Self-pay

## 2019-12-18 ENCOUNTER — Ambulatory Visit (INDEPENDENT_AMBULATORY_CARE_PROVIDER_SITE_OTHER): Payer: Medicare Other | Admitting: Family Medicine

## 2019-12-18 ENCOUNTER — Encounter: Payer: Self-pay | Admitting: Family Medicine

## 2019-12-18 VITALS — BP 129/82 | HR 99 | Temp 98.8°F | Wt 236.0 lb

## 2019-12-18 DIAGNOSIS — N390 Urinary tract infection, site not specified: Secondary | ICD-10-CM

## 2019-12-18 MED ORDER — PHENAZOPYRIDINE HCL 200 MG PO TABS
200.0000 mg | ORAL_TABLET | Freq: Three times a day (TID) | ORAL | 0 refills | Status: DC | PRN
Start: 2019-12-18 — End: 2021-08-28

## 2019-12-18 MED ORDER — SULFAMETHOXAZOLE-TRIMETHOPRIM 800-160 MG PO TABS: 1.0000 | ORAL_TABLET | Freq: Two times a day (BID) | ORAL | 0 refills | Status: DC

## 2019-12-18 NOTE — Progress Notes (Signed)
BP 129/82   Pulse 99   Temp 98.8 F (37.1 C) (Oral)   Wt 236 lb (107 kg)   SpO2 100%   BMI 43.16 kg/m    Subjective:    Patient ID: Tabitha [name], female    DOB: Jan 01, 1971, 49 y.o.   MRN: [id number]  HPI: Tabitha [name] is a 49 y.o. female  Chief Complaint  Patient presents with  . Urinary Frequency    since this morning  . Dysuria   Urinary frequency, dysuria, chills, diarrhea since this morning. Trying herbal teas and water without relief. Denies fever, back pain, n/v. Hx of UTIs that have felt similar.   Relevant past medical, surgical, family and social history reviewed and updated as indicated. Interim medical history since our last visit reviewed. Allergies and medications reviewed and updated.  Review of Systems  Per HPI unless specifically indicated above     Objective:    BP 129/82   Pulse 99   Temp 98.8 F (37.1 C) (Oral)   Wt 236 lb (107 kg)   SpO2 100%   BMI 43.16 kg/m   Wt Readings from Last 3 Encounters:  12/18/19 236 lb (107 kg)  10/17/19 235 lb (106.6 kg)  09/21/19 232 lb 8 oz (105.5 kg)    Physical Exam Vitals and nursing note reviewed.  Constitutional:      Appearance: Normal appearance. She is not ill-appearing.  HENT:     Head: Atraumatic.  Eyes:     Extraocular Movements: Extraocular movements intact.     Conjunctiva/sclera: Conjunctivae normal.  Cardiovascular:     Rate and Rhythm: Normal rate and regular rhythm.     Heart sounds: Normal heart sounds.  Pulmonary:     Effort: Pulmonary effort is normal.     Breath sounds: Normal breath sounds.  Abdominal:     General: Bowel sounds are normal. There is no distension.     Palpations: Abdomen is soft.     Tenderness: There is abdominal tenderness (diffuse mild abdominal ttp). There is no right CVA tenderness, left CVA tenderness or guarding.  Musculoskeletal:        General: Normal range of motion.     Cervical back: Normal range of motion and neck supple.  Skin:     General: Skin is warm and dry.  Neurological:     Mental Status: She is alert and oriented to person, place, and time.  Psychiatric:        Mood and Affect: Mood normal.        Thought Content: Thought content normal.        Judgment: Judgment normal.     Results for orders placed or performed in visit on 09/07/19  Microscopic Examination   URINE  Result Value Ref Range   WBC, UA 11-30 (A) 0 - 5 /hpf   RBC 3-10 (A) 0 - 2 /hpf   Epithelial Cells (non renal) 0-10 0 - 10 /hpf   Bacteria, UA Few (A) None seen/Few   Yeast, UA Present None seen  Urine Culture, Reflex   URINE  Result Value Ref Range   Urine Culture, Routine Final report    Organism ID, Bacteria Comment   UA/M w/rflx Culture, Routine (STAT)   Specimen: Urine   URINE  Result Value Ref Range   Specific Gravity, UA 1.010 1.005 - 1.030   pH, UA 5.5 5.0 - 7.5   Color, UA Yellow Yellow   Appearance Ur Clear Clear   Leukocytes,UA  1+ (A) Negative   Protein,UA Negative Negative/Trace   Glucose, UA 3+ (A) Negative   Ketones, UA Trace (A) Negative   RBC, UA Trace (A) Negative   Bilirubin, UA Negative Negative   Urobilinogen, Ur 0.2 0.2 - 1.0 mg/dL   Nitrite, UA Negative Negative   Microscopic Examination See below:    Urinalysis Reflex Comment   Comprehensive metabolic panel  Result Value Ref Range   Glucose 220 (H) 65 - 99 mg/dL   BUN 14 6 - 24 mg/dL   Creatinine, Ser 9.24 0.57 - 1.00 mg/dL   GFR calc non Af Amer 76 >59 mL/min/1.73   GFR calc Af Amer 88 >59 mL/min/1.73   BUN/Creatinine Ratio 16 9 - 23   Sodium 137 134 - 144 mmol/L   Potassium 4.1 3.5 - 5.2 mmol/L   Chloride 94 (L) 96 - 106 mmol/L   CO2 24 20 - 29 mmol/L   Calcium 9.4 8.7 - 10.2 mg/dL   Total Protein 6.5 6.0 - 8.5 g/dL   Albumin 4.2 3.8 - 4.8 g/dL   Globulin, Total 2.3 1.5 - 4.5 g/dL   Albumin/Globulin Ratio 1.8 1.2 - 2.2   Bilirubin Total 0.2 0.0 - 1.2 mg/dL   Alkaline Phosphatase 90 39 - 117 IU/L   AST 11 0 - 40 IU/L   ALT 16 0 - 32 IU/L    Lipid Panel w/o Chol/HDL Ratio  Result Value Ref Range   Cholesterol, Total 138 100 - 199 mg/dL   Triglycerides 268 (H) 0 - 149 mg/dL   HDL 55 >34 mg/dL   VLDL Cholesterol Cal 32 5 - 40 mg/dL   LDL Chol Calc (NIH) 51 0 - 99 mg/dL  HgB H9Q  Result Value Ref Range   Hgb A1c MFr Bld 6.6 (H) 4.8 - 5.6 %   Est. average glucose Bld gHb Est-mCnc 143 mg/dL      Assessment & Plan:   Problem List Items Addressed This Visit    None    Visit Diagnoses    Acute lower UTI    -  Primary   Tx with bactrim, pyridium, fluids. Await cx results. F/u if worsening or not improving   Relevant Medications   sulfamethoxazole-trimethoprim (BACTRIM DS) 800-160 MG tablet   phenazopyridine (PYRIDIUM) 200 MG tablet   Other Relevant Orders   UA/M w/rflx Culture, Routine       Follow up plan: Return if symptoms worsen or fail to improve.

## 2019-12-20 LAB — UA/M W/RFLX CULTURE, ROUTINE
Bilirubin, UA: NEGATIVE
Glucose, UA: NEGATIVE
Ketones, UA: NEGATIVE
Nitrite, UA: NEGATIVE
Specific Gravity, UA: 1.015 (ref 1.005–1.030)
Urobilinogen, Ur: 0.2 mg/dL (ref 0.2–1.0)
pH, UA: 5 (ref 5.0–7.5)

## 2019-12-20 LAB — MICROSCOPIC EXAMINATION
RBC, Urine: >30 /hpf — AB (ref 0–2)
WBC, UA: >30 /hpf — AB (ref 0–5)

## 2019-12-20 LAB — URINE CULTURE, REFLEX

## 2019-12-28 ENCOUNTER — Other Ambulatory Visit: Payer: Self-pay | Admitting: Family Medicine

## 2020-01-10 ENCOUNTER — Other Ambulatory Visit: Payer: Self-pay | Admitting: Unknown Physician Specialty

## 2020-01-10 ENCOUNTER — Telehealth: Payer: Self-pay | Admitting: Unknown Physician Specialty

## 2020-01-10 DIAGNOSIS — E1159 Type 2 diabetes mellitus with other circulatory complications: Secondary | ICD-10-CM

## 2020-01-10 DIAGNOSIS — Z794 Long term (current) use of insulin: Secondary | ICD-10-CM

## 2020-01-10 DIAGNOSIS — U071 COVID-19: Secondary | ICD-10-CM

## 2020-01-10 DIAGNOSIS — I152 Hypertension secondary to endocrine disorders: Secondary | ICD-10-CM

## 2020-01-10 DIAGNOSIS — E1165 Type 2 diabetes mellitus with hyperglycemia: Secondary | ICD-10-CM

## 2020-01-10 NOTE — Telephone Encounter (Signed)
I connected by phone with Tabitha [name]'s husband on 01/10/2020 at 4:38 PM to discuss the potential use of a new treatment for mild to moderate COVID-19 viral infection in non-hospitalized patients.  This patient is a 49 y.o. female that meets the FDA criteria for Emergency Use Authorization of COVID monoclonal antibody casirivimab/imdevimab.  Has a (+) direct SARS-CoV-2 viral test result  Has mild or moderate COVID-19   Is NOT hospitalized due to COVID-19  Is within 10 days of symptom onset  Has at least one of the high risk factor(s) for progression to severe COVID-19 and/or hospitalization as defined in EUA.  Specific high risk criteria : Cardiovascular disease or hypertension   I have spoken and communicated the following to the patient or parent/caregiver regarding COVID monoclonal antibody treatment:  1. FDA has authorized the emergency use for the treatment of mild to moderate COVID-19 in adults and pediatric patients with positive results of direct SARS-CoV-2 viral testing who are 25 years of age and older weighing at least 40 kg, and who are at high risk for progressing to severe COVID-19 and/or hospitalization.  2. The significant known and potential risks and benefits of COVID monoclonal antibody, and the extent to which such potential risks and benefits are unknown.  3. Information on available alternative treatments and the risks and benefits of those alternatives, including clinical trials.  4. Patients treated with COVID monoclonal antibody should continue to self-isolate and use infection control measures (e.g., wear mask, isolate, social distance, avoid sharing personal items, clean and disinfect "high touch" surfaces, and frequent handwashing) according to CDC guidelines.   5. The patient or parent/caregiver has the option to accept or refuse COVID monoclonal antibody treatment.  After reviewing this information with the patient's husband, The patient agreed to  proceed with receiving casirivimab\imdevimab infusion and will be provided a copy of the Fact sheet prior to receiving the infusion. Gabriel Cirri 01/10/2020 4:38 PM  Sx onset 9/11

## 2020-01-12 ENCOUNTER — Ambulatory Visit (HOSPITAL_COMMUNITY): Payer: Medicare Other | Attending: Pulmonary Disease

## 2020-01-16 ENCOUNTER — Telehealth: Payer: Self-pay | Admitting: Family Medicine

## 2020-01-16 NOTE — Telephone Encounter (Signed)
Copied from CRM [id number]. Topic: Medicare AWV >> Jan 16, 2020 11:01 AM Claudette Laws R wrote: Reason for CRM:  Left message for patient to call back and schedule Medicare Annual Wellness Visit (AWV) either virtually or in office.  Last AWV 01/19/2019  Please schedule at anytime with [facility] Health Advisor.  If any questions, please contact me at [phone]

## 2020-01-23 ENCOUNTER — Encounter (HOSPITAL_COMMUNITY): Payer: Self-pay | Admitting: Psychiatry

## 2020-01-23 ENCOUNTER — Other Ambulatory Visit: Payer: Self-pay

## 2020-01-23 ENCOUNTER — Telehealth (INDEPENDENT_AMBULATORY_CARE_PROVIDER_SITE_OTHER): Payer: Medicare Other | Admitting: Psychiatry

## 2020-01-23 DIAGNOSIS — F251 Schizoaffective disorder, depressive type: Secondary | ICD-10-CM | POA: Diagnosis not present

## 2020-01-23 DIAGNOSIS — F429 Obsessive-compulsive disorder, unspecified: Secondary | ICD-10-CM | POA: Diagnosis not present

## 2020-01-23 MED ORDER — VIIBRYD 40 MG PO TABS: 40.0000 mg | ORAL_TABLET | Freq: Every day | ORAL | 0 refills | Status: DC

## 2020-01-23 MED ORDER — AMANTADINE HCL 100 MG PO CAPS: 100.0000 mg | ORAL_CAPSULE | Freq: Two times a day (BID) | ORAL | 0 refills | Status: DC

## 2020-01-23 MED ORDER — PERPHENAZINE 4 MG PO TABS: 4.0000 mg | ORAL_TABLET | Freq: Every day | ORAL | 0 refills | Status: DC

## 2020-01-23 MED ORDER — CLONAZEPAM 0.5 MG PO TABS: 0.2500 mg | ORAL_TABLET | Freq: Two times a day (BID) | ORAL | 2 refills | Status: DC | PRN

## 2020-01-23 MED ORDER — GABAPENTIN 600 MG PO TABS: 600.0000 mg | ORAL_TABLET | Freq: Three times a day (TID) | ORAL | 0 refills | Status: DC

## 2020-01-23 NOTE — Progress Notes (Signed)
BH MD OP Progress Note  Virtual Visit via Video Note  I connected with Tabitha HouseholderKathleen J [name] on 01/23/20 at  3:30 PM EDT by a video enabled telemedicine application and verified that I am speaking with the correct person using two identifiers.  Location: Patient: Home Provider: Clinic   I discussed the limitations of evaluation and management by telemedicine and the availability of in person appointments. The patient expressed understanding and agreed to proceed.  I provided 16 minutes of non-face-to-face time during this encounter.     01/23/2020 3:31 PM Tabitha [name]  MRN:  [id number]  Chief Complaint:  "I am recovering from COVID infection."  HPI: Patient informed that she and her family recovering from Covid.  She stated that everyone got it in her family.  Her kids recently returned back to school after recovering however she and her husband are still struggling to recover fully.  She stated that luckily no one had to go to the hospital however she has been dealing a lot of physical weakness.  She stated that she did not notice any worsening of her hallucinations or paranoid delusions however she has noticed increase in her anxiety levels. She is hoping that soon things will fall in place and she will be back to her baseline.  She does not think she needs to adjust her medications at this point.    Visit Diagnosis:  No diagnosis found.  Past Psychiatric History: Schizoaffective disorder, OCD  Past Medical History:  Past Medical History:  Diagnosis Date  . Allergic rhinitis   . Anxiety   . Asthma   . Bipolar disorder (HCC)   . Depression   . Diabetes mellitus without complication (HCC)   . GERD (gastroesophageal reflux disease)   . Hyperlipidemia   . Mild sleep apnea   . OCD (obsessive compulsive disorder)   . Palpitations   . Paranoia (HCC)   . Schizoaffective disorder (HCC)   . Sleep apnea     Past Surgical History:  Procedure Laterality Date  . CESAREAN SECTION   2006/2008  . CHOLECYSTECTOMY  2006    Family Psychiatric History: denied  Family History:  Family History  Problem Relation Age of Onset  . Diabetes Father   . Hyperlipidemia Father   . Diabetes Brother   . Lupus Mother   . Diabetes Mother        pre diabetes    Social History:  Social History   Socioeconomic History  . Marital status: Married    Spouse name: kevin  . Number of children: 2  . Years of education: Not on file  . Highest education level: Bachelor's degree (e.g., BA, AB, BS)  Occupational History  . Not on file  Tobacco Use  . Smoking status: Never Smoker  . Smokeless tobacco: Never Used  Vaping Use  . Vaping Use: Never used  Substance and Sexual Activity  . Alcohol use: No    Alcohol/week: 0.0 standard drinks  . Drug use: No  . Sexual activity: Yes    Partners: Male    Birth control/protection: None, Surgical  Other Topics Concern  . Not on file  Social History Narrative  . Not on file   Social Determinants of Health   Financial Resource Strain:   . Difficulty of Paying Living Expenses: Not on file  Food Insecurity:   . Worried About Programme researcher, broadcasting/film/videounning Out of Food in the Last Year: Not on file  . Ran Out of Food in the Last Year: Not  on file  Transportation Needs:   . Lack of Transportation (Medical): Not on file  . Lack of Transportation (Non-Medical): Not on file  Physical Activity:   . Days of Exercise per Week: Not on file  . Minutes of Exercise per Session: Not on file  Stress:   . Feeling of Stress : Not on file  Social Connections:   . Frequency of Communication with Friends and Family: Not on file  . Frequency of Social Gatherings with Friends and Family: Not on file  . Attends Religious Services: Not on file  . Active Member of Clubs or Organizations: Not on file  . Attends Banker Meetings: Not on file  . Marital Status: Not on file    Allergies:  Allergies  Allergen Reactions  . Abilify [Aripiprazole] Hives     Metabolic Disorder Labs: Lab Results  Component Value Date   HGBA1C 6.6 (H) 09/07/2019   Lab Results  Component Value Date   PROLACTIN 84.8 (H) 06/01/2017   PROLACTIN 36.2 (H) 03/26/2016   Lab Results  Component Value Date   CHOL 138 09/07/2019   TRIG 202 (H) 09/07/2019   HDL 55 09/07/2019   CHOLHDL 2.9 09/26/2015   VLDL 51 (H) 04/23/2017   LDLCALC 51 09/07/2019   LDLCALC 63 04/28/2018   Lab Results  Component Value Date   TSH 1.290 04/28/2018   TSH 2.570 06/01/2017    Therapeutic Level Labs: No results found for: LITHIUM Lab Results  Component Value Date   VALPROATE 52 10/22/2017   VALPROATE 43 (L) 06/01/2017   No components found for:  CBMZ  Current Medications: Current Outpatient Medications  Medication Sig Dispense Refill  . ACCU-CHEK GUIDE test strip USE TO CHECK BLOOD SUGAR TWICE DAILY 100 strip 2  . albuterol (VENTOLIN HFA) 108 (90 Base) MCG/ACT inhaler INHALE 2 PUFFS INTO THE LUNGS EVERY 6 HOURS AS NEEDED FOR WHEEZING OR SHORTNESS OF BREATH 25.5 g 3  . amantadine (SYMMETREL) 100 MG capsule Take 1 capsule (100 mg total) by mouth 2 (two) times daily. 180 capsule 0  . atorvastatin (LIPITOR) 20 MG tablet TAKE 1 TABLET BY MOUTH DAILY 90 tablet 1  . clonazePAM (KLONOPIN) 0.5 MG tablet Take 0.5 tablets (0.25 mg total) by mouth 2 (two) times daily as needed for anxiety. 30 tablet 2  . doxycycline (VIBRAMYCIN) 50 MG capsule TAKE 1 CAPSULE(50 MG) BY MOUTH EVERY MORNING 90 capsule 0  . fluticasone (FLOVENT HFA) 110 MCG/ACT inhaler Inhale 1 puff into the lungs 2 (two) times daily. 1 Inhaler 11  . gabapentin (NEURONTIN) 600 MG tablet Take 1 tablet (600 mg total) by mouth 3 (three) times daily. 270 tablet 0  . hydrochlorothiazide (HYDRODIURIL) 12.5 MG tablet TAKE 1 TABLET(12.5 MG) BY MOUTH DAILY 90 tablet 1  . insulin degludec (TRESIBA FLEXTOUCH) 100 UNIT/ML FlexTouch Pen INJECT 0.4 MLS(40 UNITS TOTAL) UNDER THE SKIN DAILY 15 mL 5  . Insulin Pen Needle (BD PEN NEEDLE  NANO U/F) 32G X 4 MM MISC 1 each by Does not apply route daily. 100 each 12  . MELATONIN PO Take 30 mg by mouth. Taking 1 tablet daily at night    . meloxicam (MOBIC) 15 MG tablet TAKE 1 TABLET(15 MG) BY MOUTH DAILY 30 tablet 0  . metFORMIN (GLUCOPHAGE) 500 MG tablet TAKE 2 TABLETS(1000 MG) BY MOUTH TWICE DAILY WITH A MEAL 360 tablet 1  . NOVOTWIST 32G X 5 MM MISC USE WITH LEVEMIR PEN BID  5  . omeprazole (PRILOSEC) 10  MG capsule TAKE 1 CAPSULE BY MOUTH DAILY. 90 capsule 1  . paliperidone (INVEGA SUSTENNA) 234 MG/1.5ML SUSY injection Inject 234 mg into the muscle once for 1 dose. 1.5 mL 11  . phenazopyridine (PYRIDIUM) 200 MG tablet Take 1 tablet (200 mg total) by mouth 3 (three) times daily as needed for pain. 10 tablet 0  . pioglitazone (ACTOS) 45 MG tablet TAKE 1 TABLET(45 MG) BY MOUTH DAILY 90 tablet 1  . Semaglutide,0.25 or 0.5MG /DOS, (OZEMPIC, 0.25 OR 0.5 MG/DOSE,) 2 MG/1.5ML SOPN Inject 0.375 mLs (0.5 mg total) into the skin once a week. 1 pen 5  . sulfamethoxazole-trimethoprim (BACTRIM DS) 800-160 MG tablet Take 1 tablet by mouth 2 (two) times daily. 10 tablet 0  . Vilazodone HCl (VIIBRYD) 40 MG TABS Take 1 tablet (40 mg total) by mouth daily. 90 tablet 0   Current Facility-Administered Medications  Medication Dose Route Frequency Provider Last Rate Last Admin  . paliperidone (INVEGA SUSTENNA) injection 234 mg  234 mg Intramuscular Q30 days Zena Amos, MD   234 mg at 12/14/19 1407     Musculoskeletal: Strength & Muscle Tone: unable to assess due to telemed visit Gait & Station: unable to assess due to telemed visit Patient leans: unable to assess due to telemed visit  Psychiatric Specialty Exam: ROS  There were no vitals taken for this visit.There is no height or weight on file to calculate BMI.  General Appearance: Fairly Groomed  Eye Contact:  Good  Speech:  Clear and Coherent and Normal Rate  Volume:  Normal  Mood:  Euthymic  Affect:  Restricted  Thought Process:  Goal  Directed, Linear and Descriptions of Associations: Intact  Orientation:  Full (Time, Place, and Person)  Thought Content: Hallucinations: Auditory and Paranoid Ideation - both much improved  Suicidal Thoughts:  No  Homicidal Thoughts:  No  Memory:  Recent;   Good Remote;   Good  Judgement:  Fair  Insight:  Fair  Psychomotor Activity:  Decreased  Concentration:  Concentration: Good and Attention Span: Good  Recall:  Good  Fund of Knowledge: Good  Language: Good  Akathisia:  No  Handed:  Right  AIMS (if indicated): unable to complete due to telemed visit  Assets:  Communication Skills Desire for Improvement Financial Resources/Insurance Housing Social Support  ADL's:  Intact  Cognition: WNL  Sleep:  Good   Screenings: AIMS     [facility] Visit from [facility]12/2017 in [facility] Office Visit from 07/29/2016 in [facility] Office Visit from 05/06/2016 in [facility] Office Visit from 04/01/2016 in [facility] Office Visit from 03/28/2015 in [facility]  AIMS Total Score 0 0 0 0 0    GAD-7     Office Visit from 04/28/2018 in [facility]  Total GAD-7 Score 8    Mini-Mental     Office Visit from 09/07/2019 in [facility]  Total Score (max 30 points ) 16    PHQ2-9     Clinical Support from 01/19/2019 in [facility] Office Visit from 04/28/2018 in [facility] Clinical Support from 01/13/2018 in [facility] Clinical Support from 01/13/2017 in [facility] Office Visit from 09/24/2015 in [place]  PHQ-2 Total Score 0 4 2 2 3   PHQ-9 Total Score -- 11 8 12 8        Assessment and Plan: Patient reported excessive weight gain due to impulsive eating and asked if she  could be prescribed something to help with that.  Patient was recommended that we discontinue the Depakote  as that is usually associated with increase in appetite and resulting weight gain.  Patient was advised that we can continue the rest of the regimen the way it is and touch base in 6 weeks to see if that has helped her weight issues.  Patient was reassured that making healthier dietary choices would also be beneficial and if necessary we can add something in the future to help her curb her appetite.  Patient was agreeable to this recommendation.   1. Schizoaffective disorder, depressive type (HCC) - Continue Invega Sustenna 234 mg qmonthly. - Vilazodone HCl (VIIBRYD) 40 MG TABS; Take 1 tablet (40 mg total) by mouth every morning.  Dispense: 90 tablet; Refill: 1 - gabapentin (NEURONTIN) 600 MG tablet; Take 1 tablet (600 mg total) by mouth 3 (three) times daily.  Dispense: 270 tablet; Refill: 1 - amantadine (SYMMETREL) 100 MG capsule; Take 1 capsule (100 mg total) by mouth 2 (two) times daily.  Dispense: 180 capsule; Refill: 1 - Continue Perphenazine 4 mg HS.  2. Obsessive-compulsive disorder, unspecified type  - Vilazodone HCl (VIIBRYD) 40 MG TABS; Take 1 tablet (40 mg total) by mouth every morning.  Dispense: 90 tablet; Refill: 1 - clonazePAM (KLONOPIN) 0.5 MG tablet; Take 0.5 tablets (0.25 mg total) by mouth 2 (two) times daily as needed for anxiety.  Dispense: 30 tablet; Refill: 2   Continue monthly IM Invega injections.  F/up in 8 weeks.   Zena Amos, MD 01/23/2020, 3:31 PM

## 2020-02-20 ENCOUNTER — Other Ambulatory Visit: Payer: Self-pay | Admitting: Family Medicine

## 2020-02-20 DIAGNOSIS — L719 Rosacea, unspecified: Secondary | ICD-10-CM

## 2020-02-20 NOTE — Telephone Encounter (Signed)
Requested medication (s) are due for refill today: yes  Requested medication (s) are on the active medication list:  yes  Last refill:  12/15/19  Future visit scheduled: yes  Notes to clinic:  no assigned protocol    Requested Prescriptions  Pending Prescriptions Disp Refills   doxycycline (VIBRAMYCIN) 50 MG capsule [Pharmacy Med Name: DOXYCYCLINE HYC 50MG  CAPS] 90 capsule 0    Sig: TAKE 1 CAPSULE(50 MG) BY MOUTH EVERY MORNING      Off-Protocol Failed - 02/20/2020  9:02 AM      Failed - Medication not assigned to a protocol, review manually.      Passed - Valid encounter within last 12 months    Recent Outpatient Visits           2 months ago Acute lower UTI   [facility] [place], [place]   4 months ago Type 2 diabetes mellitus with hyperglycemia, with long-term current use of insulin [facility])   [facility], [place], [place]   5 months ago Rash   [facility] Cr[place], Watson T, NP   5 months ago Controlled type 2 diabetes mellitus without complication, with long-term current use of insulin [facility])   [facility], [place], [place]   1 year ago Acute pain of left knee   [facility], [facility], [place]       Future Appointments             In 4 weeks [place], [place], NP Dorie Rank, [facility]   In 1 month [place], [employer], FNP [facility], [facility]

## 2020-02-20 NOTE — Telephone Encounter (Signed)
Lvm to make this apt. 

## 2020-02-21 NOTE — Telephone Encounter (Signed)
Refused by: Pablo Ledger, CMA    Refusal reason: Patient has requested refill too soon (not due until November)   For: Acne rosacea

## 2020-03-14 ENCOUNTER — Ambulatory Visit (INDEPENDENT_AMBULATORY_CARE_PROVIDER_SITE_OTHER): Payer: Medicare Other

## 2020-03-14 ENCOUNTER — Other Ambulatory Visit: Payer: Self-pay

## 2020-03-14 ENCOUNTER — Telehealth (HOSPITAL_COMMUNITY): Payer: Medicare Other | Admitting: Psychiatry

## 2020-03-14 ENCOUNTER — Telehealth (HOSPITAL_COMMUNITY): Payer: Self-pay | Admitting: Psychiatry

## 2020-03-14 DIAGNOSIS — F251 Schizoaffective disorder, depressive type: Secondary | ICD-10-CM | POA: Diagnosis not present

## 2020-03-14 DIAGNOSIS — F3170 Bipolar disorder, currently in remission, most recent episode unspecified: Secondary | ICD-10-CM

## 2020-03-14 MED ORDER — PALIPERIDONE PALMITATE ER 234 MG/1.5ML IM SUSY
234.0000 mg | PREFILLED_SYRINGE | INTRAMUSCULAR | Status: DC
  Administered 2020-03-14 – 2021-01-02 (×10): 234 mg via INTRAMUSCULAR

## 2020-03-14 NOTE — Telephone Encounter (Signed)
Order placed for IM Invega 234 mg to be administered every month.

## 2020-03-14 NOTE — Patient Instructions (Signed)
invega sustenna 234mg  was given today left outer gluteal area.   Exp. Date: 07-26-2021   lot #  [id number] S/N# 09-25-2021 GTIN: [id number]  NDC# [id number] pt is having a lot of anxiety. Pt has appt with dr. [id number] tomorrow  03-15-20. Pt was also advised she needed to do better at keeping her appt for her injections. This was also discussed with dr. 03-17-20.

## 2020-03-15 ENCOUNTER — Telehealth (INDEPENDENT_AMBULATORY_CARE_PROVIDER_SITE_OTHER): Payer: Medicare Other | Admitting: Psychiatry

## 2020-03-15 ENCOUNTER — Encounter (HOSPITAL_COMMUNITY): Payer: Self-pay | Admitting: Psychiatry

## 2020-03-15 DIAGNOSIS — F429 Obsessive-compulsive disorder, unspecified: Secondary | ICD-10-CM

## 2020-03-15 DIAGNOSIS — F251 Schizoaffective disorder, depressive type: Secondary | ICD-10-CM | POA: Diagnosis not present

## 2020-03-15 MED ORDER — PERPHENAZINE 4 MG PO TABS: 4.0000 mg | ORAL_TABLET | Freq: Every day | ORAL | 0 refills | Status: DC

## 2020-03-15 MED ORDER — GABAPENTIN 600 MG PO TABS: 600.0000 mg | ORAL_TABLET | Freq: Three times a day (TID) | ORAL | 0 refills | Status: DC

## 2020-03-15 MED ORDER — CLONAZEPAM 0.5 MG PO TABS: 0.2500 mg | ORAL_TABLET | Freq: Two times a day (BID) | ORAL | 2 refills | Status: DC | PRN

## 2020-03-15 MED ORDER — VIIBRYD 40 MG PO TABS: 40.0000 mg | ORAL_TABLET | Freq: Every day | ORAL | 0 refills | Status: DC

## 2020-03-15 MED ORDER — AMANTADINE HCL 100 MG PO CAPS: 100.0000 mg | ORAL_CAPSULE | Freq: Two times a day (BID) | ORAL | 0 refills | Status: DC

## 2020-03-15 NOTE — Progress Notes (Signed)
BH MD OP Progress Note  Virtual Visit via Telephone Note  I connected with Tabitha [name] on 03/15/20 at  3:40 PM EST by telephone and verified that I am speaking with the correct person using two identifiers.  Location: Patient: home Provider: Clinic   I discussed the limitations, risks, security and privacy concerns of performing an evaluation and management service by telephone and the availability of in person appointments. I also discussed with the patient that there may be a patient responsible charge related to this service. The patient expressed understanding and agreed to proceed.   I provided 15 minutes of non-face-to-face time during this encounter.      03/15/2020 10:34 AM Tabitha [name]  MRN:  [id number]  Chief Complaint:  " I am having increased paranoid delusions."  HPI: Patient reported that she is having increased paranoid delusions for the past few days.  She knows that is not true but she has is paranoid ideations of her family calling police on her. Of note patient had missed her monthly Invega injection doses back in September and October because she was sick with COVID-19.  She got her monthly injection yesterday.  However during that visit she was noted to be anxious and had expressed increased paranoia. Today, patient stated that she does not know what to do and she does not want to act impulsively.  She was open to writer's recommendation of taking perphenazine twice a day for the next few days until her better delusions settle down.  Since she got the Invega injection dose yesterday she is anticipated to do better after a few days. When asked regarding any plans for Thanksgiving, she replied that her mother-in-law is sick and is currently hospitalized so the family is not sure of how things will turn out.  Patient was appreciative of this recommendation and agreed to touch base in a month from now.  She was informed that the clinic will be closed on the day  of Thanksgiving and the following day.  We will be here the other days and she can contact us if needed however if things do not improve she can go to the nearest emergency room.   Visit Diagnosis:    ICD-10-CM   1. Schizoaffective disorder, depressive type (HCC)  F25.1 gabapentin (NEURONTIN) 600 MG tablet    amantadine (SYMMETREL) 100 MG capsule    Vilazodone HCl (VIIBRYD) 40 MG TABS    perphenazine (TRILAFON) 4 MG tablet  2. Obsessive-compulsive disorder, unspecified type  F42.9 clonazePAM (KLONOPIN) 0.5 MG tablet    Vilazodone HCl (VIIBRYD) 40 MG TABS    Past Psychiatric History: Schizoaffective disorder, OCD  Past Medical History:  Past Medical History:  Diagnosis Date  . Allergic rhinitis   . Anxiety   . Asthma   . Bipolar disorder (HCC)   . Depression   . Diabetes mellitus without complication (HCC)   . GERD (gastroesophageal reflux disease)   . Hyperlipidemia   . Mild sleep apnea   . OCD (obsessive compulsive disorder)   . Palpitations   . Paranoia (HCC)   . Schizoaffective disorder (HCC)   . Sleep apnea     Past Surgical History:  Procedure Laterality Date  . CESAREAN SECTION  2006/2008  . CHOLECYSTECTOMY  2006    Family Psychiatric History: denied  Family History:  Family History  Problem Relation Age of Onset  . Diabetes Father   . Hyperlipidemia Father   . Diabetes Brother   . Lupus Mother   .  Diabetes Mother        pre diabetes    Social History:  Social History   Socioeconomic History  . Marital status: Married    Spouse name: kevin  . Number of children: 2  . Years of education: Not on file  . Highest education level: Bachelor's degree (e.g., BA, AB, BS)  Occupational History  . Not on file  Tobacco Use  . Smoking status: Never Smoker  . Smokeless tobacco: Never Used  Vaping Use  . Vaping Use: Never used  Substance and Sexual Activity  . Alcohol use: No    Alcohol/week: 0.0 standard drinks  . Drug use: No  . Sexual activity: Yes     Partners: Male    Birth control/protection: None, Surgical  Other Topics Concern  . Not on file  Social History Narrative  . Not on file   Social Determinants of Health   Financial Resource Strain:   . Difficulty of Paying Living Expenses: Not on file  Food Insecurity:   . Worried About Programme researcher, broadcasting/film/videounning Out of Food in the Last Year: Not on file  . Ran Out of Food in the Last Year: Not on file  Transportation Needs:   . Lack of Transportation (Medical): Not on file  . Lack of Transportation (Non-Medical): Not on file  Physical Activity:   . Days of Exercise per Week: Not on file  . Minutes of Exercise per Session: Not on file  Stress:   . Feeling of Stress : Not on file  Social Connections:   . Frequency of Communication with Friends and Family: Not on file  . Frequency of Social Gatherings with Friends and Family: Not on file  . Attends Religious Services: Not on file  . Active Member of Clubs or Organizations: Not on file  . Attends BankerClub or Organization Meetings: Not on file  . Marital Status: Not on file    Allergies:  Allergies  Allergen Reactions  . Abilify [Aripiprazole] Hives    Metabolic Disorder Labs: Lab Results  Component Value Date   HGBA1C 6.6 (H) 09/07/2019   Lab Results  Component Value Date   PROLACTIN 84.8 (H) 06/01/2017   PROLACTIN 36.2 (H) 03/26/2016   Lab Results  Component Value Date   CHOL 138 09/07/2019   TRIG 202 (H) 09/07/2019   HDL 55 09/07/2019   CHOLHDL 2.9 09/26/2015   VLDL 51 (H) 04/23/2017   LDLCALC 51 09/07/2019   LDLCALC 63 04/28/2018   Lab Results  Component Value Date   TSH 1.290 04/28/2018   TSH 2.570 06/01/2017    Therapeutic Level Labs: No results found for: LITHIUM Lab Results  Component Value Date   VALPROATE 52 10/22/2017   VALPROATE 43 (L) 06/01/2017   No components found for:  CBMZ  Current Medications: Current Outpatient Medications  Medication Sig Dispense Refill  . ACCU-CHEK GUIDE test strip USE TO CHECK  BLOOD SUGAR TWICE DAILY 100 strip 2  . albuterol (VENTOLIN HFA) 108 (90 Base) MCG/ACT inhaler INHALE 2 PUFFS INTO THE LUNGS EVERY 6 HOURS AS NEEDED FOR WHEEZING OR SHORTNESS OF BREATH 25.5 g 3  . amantadine (SYMMETREL) 100 MG capsule Take 1 capsule (100 mg total) by mouth 2 (two) times daily. 180 capsule 0  . atorvastatin (LIPITOR) 20 MG tablet TAKE 1 TABLET BY MOUTH DAILY 90 tablet 1  . clonazePAM (KLONOPIN) 0.5 MG tablet Take 0.5 tablets (0.25 mg total) by mouth 2 (two) times daily as needed for anxiety. 30 tablet 2  .  doxycycline (VIBRAMYCIN) 50 MG capsule TAKE 1 CAPSULE(50 MG) BY MOUTH EVERY MORNING 90 capsule 0  . fluticasone (FLOVENT HFA) 110 MCG/ACT inhaler Inhale 1 puff into the lungs 2 (two) times daily. 1 Inhaler 11  . gabapentin (NEURONTIN) 600 MG tablet Take 1 tablet (600 mg total) by mouth 3 (three) times daily. 270 tablet 0  . hydrochlorothiazide (HYDRODIURIL) 12.5 MG tablet TAKE 1 TABLET(12.5 MG) BY MOUTH DAILY 90 tablet 1  . insulin degludec (TRESIBA FLEXTOUCH) 100 UNIT/ML FlexTouch Pen INJECT 0.4 MLS(40 UNITS TOTAL) UNDER THE SKIN DAILY 15 mL 5  . Insulin Pen Needle (BD PEN NEEDLE NANO U/F) 32G X 4 MM MISC 1 each by Does not apply route daily. 100 each 12  . MELATONIN PO Take 30 mg by mouth. Taking 1 tablet daily at night    . meloxicam (MOBIC) 15 MG tablet TAKE 1 TABLET(15 MG) BY MOUTH DAILY 30 tablet 0  . metFORMIN (GLUCOPHAGE) 500 MG tablet TAKE 2 TABLETS(1000 MG) BY MOUTH TWICE DAILY WITH A MEAL 360 tablet 1  . NOVOTWIST 32G X 5 MM MISC USE WITH LEVEMIR PEN BID  5  . omeprazole (PRILOSEC) 10 MG capsule TAKE 1 CAPSULE BY MOUTH DAILY. 90 capsule 1  . paliperidone (INVEGA SUSTENNA) 234 MG/1.5ML SUSY injection Inject 234 mg into the muscle once for 1 dose. 1.5 mL 11  . perphenazine (TRILAFON) 4 MG tablet Take 1 tablet (4 mg total) by mouth at bedtime. 90 tablet 0  . phenazopyridine (PYRIDIUM) 200 MG tablet Take 1 tablet (200 mg total) by mouth 3 (three) times daily as needed for  pain. 10 tablet 0  . pioglitazone (ACTOS) 45 MG tablet TAKE 1 TABLET(45 MG) BY MOUTH DAILY 90 tablet 1  . Semaglutide,0.25 or 0.5MG /DOS, (OZEMPIC, 0.25 OR 0.5 MG/DOSE,) 2 MG/1.5ML SOPN Inject 0.375 mLs (0.5 mg total) into the skin once a week. 1 pen 5  . sulfamethoxazole-trimethoprim (BACTRIM DS) 800-160 MG tablet Take 1 tablet by mouth 2 (two) times daily. 10 tablet 0  . Vilazodone HCl (VIIBRYD) 40 MG TABS Take 1 tablet (40 mg total) by mouth daily. 90 tablet 0   Current Facility-Administered Medications  Medication Dose Route Frequency Provider Last Rate Last Admin  . paliperidone (INVEGA SUSTENNA) injection 234 mg  234 mg Intramuscular Q30 days Zena Amos, MD   234 mg at 12/14/19 1407  . paliperidone (INVEGA SUSTENNA) injection 234 mg  234 mg Intramuscular Q30 days Zena Amos, MD   234 mg at 03/14/20 1315     Musculoskeletal: Strength & Muscle Tone: unable to assess due to telemed visit Gait & Station: unable to assess due to telemed visit Patient leans: unable to assess due to telemed visit  Psychiatric Specialty Exam: ROS  There were no vitals taken for this visit.There is no height or weight on file to calculate BMI.  General Appearance: unable to assess due to phone encounter  Eye Contact:  unable to assess due to phone encounter  Speech:  Clear and Coherent and Normal Rate  Volume:  Normal  Mood:  Euthymic  Affect:  Restricted  Thought Process:  Goal Directed, Linear and Descriptions of Associations: Intact  Orientation:  Full (Time, Place, and Person)  Thought Content: Hallucinations: Auditory and Paranoid Ideations present  Suicidal Thoughts:  No  Homicidal Thoughts:  No  Memory:  Recent;   Good Remote;   Good  Judgement:  Fair  Insight:  Fair  Psychomotor Activity:  Decreased  Concentration:  Concentration: Good and Attention Span: Good  Recall:  Good  Fund of Knowledge: Good  Language: Good  Akathisia:  No  Handed:  Right  AIMS (if indicated): unable to  complete due to telemed visit  Assets:  Communication Skills Desire for Improvement Financial Resources/Insurance Housing Social Support  ADL's:  Intact  Cognition: WNL  Sleep:  Fair   Screenings: AIMS     Office Visit from 05/12/2017 in [facility] Office Visit from 07/29/2016 in [facility] Office Visit from 05/06/2016 in [facility] Office Visit from 04/01/2016 in [facility] Office Visit from 03/28/2015 in [facility]  AIMS Total Score 0 0 0 0 0    GAD-7     Office Visit from 04/28/2018 in [facility]  Total GAD-7 Score 8    Mini-Mental     Office Visit from 09/07/2019 in [facility]  Total Score (max 30 points ) 16    PHQ2-9     Clinical Support from 01/19/2019 in [facility] Office Visit from 04/28/2018 in [facility] Clinical Support from 01/13/2018 in [facility] Clinical Support from 01/13/2017 in [facility] Office Visit from 09/24/2015 in [place]  PHQ-2 Total Score 0 4 2 2 3   PHQ-9 Total Score -- 11 8 12 8        Assessment and Plan: Pt reported increased paranoid delusions in the context of missing her monthly Invega injection doses for 2 consecutive months due to being sick due to COVID-19 infection.  She received her monthly injection dose yesterday but still having a lot of paranoid delusions.  She is open to the recommendation of increasing the dose of perphenazine 4 mg to twice a day for the next few days till her symptoms settle down.  1. Schizoaffective disorder, depressive type (HCC) - Continue Invega Sustenna 234 mg qmonthly. - Vilazodone HCl (VIIBRYD) 40 MG TABS; Take 1 tablet (40 mg total) by mouth every morning.  Dispense: 90 tablet; Refill: 1 - gabapentin (NEURONTIN) 600 MG tablet; Take 1 tablet (600 mg total) by mouth 3 (three)  times daily.  Dispense: 270 tablet; Refill: 1 - amantadine (SYMMETREL) 100 MG capsule; Take 1 capsule (100 mg total) by mouth 2 (two) times daily.  Dispense: 180 capsule; Refill: 1 - Continue Perphenazine 4 mg HS.  2. Obsessive-compulsive disorder, unspecified type  - Vilazodone HCl (VIIBRYD) 40 MG TABS; Take 1 tablet (40 mg total) by mouth every morning.  Dispense: 90 tablet; Refill: 1 - clonazePAM (KLONOPIN) 0.5 MG tablet; Take 0.5 tablets (0.25 mg total) by mouth 2 (two) times daily as needed for anxiety.  Dispense: 30 tablet; Refill: 2   Continue monthly IM Invega injections.  F/up in 8 weeks.   , MD 03/15/2020, 10:34 AM

## 2020-03-20 ENCOUNTER — Ambulatory Visit: Payer: Medicare Other | Admitting: Nurse Practitioner

## 2020-03-20 ENCOUNTER — Ambulatory Visit: Payer: Medicare Other | Admitting: Family Medicine

## 2020-03-28 ENCOUNTER — Other Ambulatory Visit: Payer: Self-pay | Admitting: Family Medicine

## 2020-03-28 DIAGNOSIS — L719 Rosacea, unspecified: Secondary | ICD-10-CM

## 2020-03-28 MED ORDER — BD PEN NEEDLE NANO U/F 32G X 4 MM MISC
1.0000 | Freq: Every day | 12 refills | Status: DC
Start: 2020-03-28 — End: 2021-11-24

## 2020-03-28 MED ORDER — DOXYCYCLINE HYCLATE 50 MG PO CAPS: ORAL_CAPSULE | ORAL | 0 refills | Status: DC

## 2020-03-28 NOTE — Telephone Encounter (Signed)
Routing to provider  

## 2020-03-28 NOTE — Telephone Encounter (Signed)
  Notes to clinic:   Pt has new pt appt with Danielle Rankin at [facility] on 04/15/20, but hopes she can get enough refilled to get her through to this appt  Requested Prescriptions  Pending Prescriptions Disp Refills   doxycycline (VIBRAMYCIN) 50 MG capsule 90 capsule 0    Sig: TAKE 1 CAPSULE(50 MG) BY MOUTH EVERY MORNING      Off-Protocol Failed - 03/28/2020  9:57 AM      Failed - Medication not assigned to a protocol, review manually.      Passed - Valid encounter within last 12 months    Recent Outpatient Visits           3 months ago Acute lower UTI   [facility] [place], [place]   5 months ago Type 2 diabetes mellitus with hyperglycemia, with long-term current use of insulin [facility])   [facility], [place], [place]   6 months ago Rash   [facility] [place], [place] T, NP   6 months ago Controlled type 2 diabetes mellitus without complication, with long-term current use of insulin [facility])   [facility], [place], [place]   1 year ago Acute pain of left knee   [facility] [place], [place]       Future Appointments             In 2 weeks Malfi, Jodelle Gross, FNP [facility], [facility]              Insulin Pen Needle (BD PEN NEEDLE NANO U/F) 32G X 4 MM MISC 100 each 12    Sig: 1 each by Does not apply route daily.      Endocrinology: Diabetes - Testing Supplies Passed - 03/28/2020  9:57 AM      Passed - Valid encounter within last 12 months    Recent Outpatient Visits           3 months ago Acute lower UTI   [facility] Particia [place], [place]   5 months ago Type 2 diabetes mellitus with hyperglycemia, with long-term current use of insulin [facility])   [facility], [place], [place]   6 months ago Rash   [facility] [place], [place] T, NP   6 months ago Controlled type 2 diabetes mellitus  without complication, with long-term current use of insulin [facility])   [facility], [place], [place]   1 year ago Acute pain of left knee   [facility] Particia [place], [place]       Future Appointments             In 2 weeks Malfi, Jodelle Gross, FNP [facility], [facility]

## 2020-03-28 NOTE — Telephone Encounter (Signed)
Pt request refill  doxycycline (VIBRAMYCIN) 50 MG capsule Insulin Pen Needle (BD PEN NEEDLE NANO U/F) 32G X 4 MM MISC  Pt has new pt appt with Danielle Rankin at [facility] on 04/15/20, but hopes she can get enough refilled to get her through to this appt.  [id number] [place], [place] - [address] AT [facility] [address] [facility] Phone:  [phone]  Fax:  [phone]

## 2020-04-08 ENCOUNTER — Other Ambulatory Visit: Payer: Self-pay | Admitting: Family Medicine

## 2020-04-08 DIAGNOSIS — Z794 Long term (current) use of insulin: Secondary | ICD-10-CM

## 2020-04-09 NOTE — Telephone Encounter (Signed)
Notes to clinic:  Patient has a appt on 04/15/2020 with new provider Review for short supply    Requested Prescriptions  Pending Prescriptions Disp Refills   metFORMIN (GLUCOPHAGE) 500 MG tablet [Pharmacy Med Name: METFORMIN 500MG TABLETS] 360 tablet 0    Sig: TAKE 2 TABLETS(1000 MG) BY MOUTH TWICE DAILY WITH A MEAL      Endocrinology:  Diabetes - Biguanides Failed - 04/08/2020  9:41 PM      Failed - HBA1C is between 0 and 7.9 and within 180 days    HB A1C (BAYER DCA - WAIVED)  Date Value Ref Range Status  10/22/2017 6.9 <7.0 % Final    Comment:                                          Diabetic Adult            <7.0                                       Healthy Adult        4.3 - 5.7                                                           (DCCT/NGSP) American Diabetes Association's Summary of Glycemic Recommendations for Adults with Diabetes: Hemoglobin A1c <7.0%. More stringent glycemic goals (A1c <6.0%) may further reduce complications at the cost of increased risk of hypoglycemia.    Hgb A1c MFr Bld  Date Value Ref Range Status  09/07/2019 6.6 (H) 4.8 - 5.6 % Final    Comment:             Prediabetes: 5.7 - 6.4          Diabetes: >6.4          Glycemic control for adults with diabetes: <7.0           Passed - Cr in normal range and within 360 days    Creat  Date Value Ref Range Status  09/26/2015 0.73 0.50 - 1.10 mg/dL Final   Creatinine, Ser  Date Value Ref Range Status  09/07/2019 0.90 0.57 - 1.00 mg/dL Final          Passed - eGFR in normal range and within 360 days    GFR calc Af Amer  Date Value Ref Range Status  09/07/2019 88 >59 mL/min/1.73 Final    Comment:    **[facility] currently reports eGFR in compliance with the current**   recommendations of the [employer]. [facility] will   update reporting as new guidelines are published from the NKF-ASN   Task force.    GFR calc non Af Amer  Date Value Ref Range Status  09/07/2019 76 >59  mL/min/1.73 Final          Passed - Valid encounter within last 6 months    Recent Outpatient Visits           3 months ago Acute lower UTI   [place], [place]   5 months ago Type 2 diabetes mellitus with hyperglycemia, with long-term  current use of insulin [facility])   [place], [place], [place]   6 months ago [place], [place] T, NP   7 months ago Controlled type 2 diabetes mellitus without complication, with long-term current use of insulin [facility])   [place], [place], [place]   1 year ago Acute pain of left knee   [facility] [place], [place]       Future Appointments             In 6 days Malfi, Lupita Raider, [facility], [facility]

## 2020-04-09 NOTE — Telephone Encounter (Signed)
Can a small amount be sent in

## 2020-04-10 ENCOUNTER — Ambulatory Visit (INDEPENDENT_AMBULATORY_CARE_PROVIDER_SITE_OTHER): Payer: Medicare Other | Admitting: *Deleted

## 2020-04-10 ENCOUNTER — Other Ambulatory Visit: Payer: Self-pay

## 2020-04-10 ENCOUNTER — Encounter (HOSPITAL_COMMUNITY): Payer: Self-pay | Admitting: Psychiatry

## 2020-04-10 ENCOUNTER — Telehealth (INDEPENDENT_AMBULATORY_CARE_PROVIDER_SITE_OTHER): Payer: Medicare Other | Admitting: Psychiatry

## 2020-04-10 VITALS — BP 138/90 | HR 97 | Ht 62.0 in | Wt 242.0 lb

## 2020-04-10 DIAGNOSIS — F429 Obsessive-compulsive disorder, unspecified: Secondary | ICD-10-CM

## 2020-04-10 DIAGNOSIS — F251 Schizoaffective disorder, depressive type: Secondary | ICD-10-CM

## 2020-04-10 NOTE — Progress Notes (Signed)
Patient given Gean Birchwood in upper outer left quqdrant. Patient tolerated injection well. Patient to return in 4 weeks.

## 2020-04-10 NOTE — Progress Notes (Signed)
BH MD OP Progress Note  Virtual Visit via Telephone Note  I connected with Tabitha [name] on 04/10/20 at  1:40 PM EST by telephone and verified that I am speaking with the correct person using two identifiers.  Location: Patient: home Provider: Clinic   I discussed the limitations, risks, security and privacy concerns of performing an evaluation and management service by telephone and the availability of in person appointments. I also discussed with the patient that there may be a patient responsible charge related to this service. The patient expressed understanding and agreed to proceed.   I provided 16 minutes of non-face-to-face time during this encounter.      04/10/2020 2:12 PM Tabitha [name]  MRN:  [id number]  Chief Complaint:  " I am feeling anxious and having those paranoid thoughts."  HPI: Patient is scheduled to be seen on December 20 however she contacted the clinic to request to be seen sooner due to some scheduling conflict.   Patient received her monthly dose of IM Invega sustain a 234 mg earlier today.  Patient was contacted by the writer today for follow-up.  Patient reported that she has been feeling anxious and has been having those paranoid thoughts of someone trying to hurt her bothering her lately. She did not find increasing the dose of perphenazine to be too helpful.  She stated that she takes the clonazepam half tablet only as needed and not on a regular basis. Writer clarified regarding the paranoid delusions, she stated that he had improved a few days after she had spoken to the writer however they got worse a few days ago once again. Writer pointed out that this coincides with her monthly timing of the injection.  It appears that her paranoid delusions worsen as the dose of IM monthly Invega injection starts feeling out and when she gets the subsequent dose her delusions improved.  Writer provided her with reassurance that since she got her dose of  [place] earlier today she should notice improvement in her paranoid delusions over the next 48 hours.  Writer advised her to take her clonazepam on a scheduled basis for the next to 3 days until her anxiety and delusions improved.  Patient verbalized understanding with this.  When asked regarding her plans for Christmas, she stated that the family was staying at home and not going anywhere.    Visit Diagnosis:    ICD-10-CM   1. Schizoaffective disorder, depressive type (HCC)  F25.1   2. Obsessive-compulsive disorder, unspecified type  F42.9     Past Psychiatric History: Schizoaffective disorder, OCD  Past Medical History:  Past Medical History:  Diagnosis Date   Allergic rhinitis    Anxiety    Asthma    Bipolar disorder (HCC)    Depression    Diabetes mellitus without complication (HCC)    GERD (gastroesophageal reflux disease)    Hyperlipidemia    Mild sleep apnea    OCD (obsessive compulsive disorder)    Palpitations    Paranoia (HCC)    Schizoaffective disorder (HCC)    Sleep apnea     Past Surgical History:  Procedure Laterality Date   CESAREAN SECTION  2006/2008   CHOLECYSTECTOMY  2006    Family Psychiatric History: denied  Family History:  Family History  Problem Relation Age of Onset   Diabetes Father    Hyperlipidemia Father    Diabetes Brother    Lupus Mother    Diabetes Mother  pre diabetes    Social History:  Social History   Socioeconomic History   Marital status: Married    Spouse name: kevin   Number of children: 2   Years of education: Not on file   Highest education level: Bachelor's degree (e.g., BA, AB, BS)  Occupational History   Not on file  Tobacco Use   Smoking status: Never Smoker   Smokeless tobacco: Never Used  Vaping Use   Vaping Use: Never used  Substance and Sexual Activity   Alcohol use: No    Alcohol/week: 0.0 standard drinks   Drug use: No   Sexual activity: Yes     Partners: Male    Birth control/protection: None, Surgical  Other Topics Concern   Not on file  Social History Narrative   Not on file   Social Determinants of Health   Financial Resource Strain: Not on file  Food Insecurity: Not on file  Transportation Needs: Not on file  Physical Activity: Not on file  Stress: Not on file  Social Connections: Not on file    Allergies:  Allergies  Allergen Reactions   Abilify [Aripiprazole] Hives    Metabolic Disorder Labs: Lab Results  Component Value Date   HGBA1C 6.6 (H) 09/07/2019   Lab Results  Component Value Date   PROLACTIN 84.8 (H) 06/01/2017   PROLACTIN 36.2 (H) 03/26/2016   Lab Results  Component Value Date   CHOL 138 09/07/2019   TRIG 202 (H) 09/07/2019   HDL 55 09/07/2019   CHOLHDL 2.9 09/26/2015   VLDL 51 (H) 04/23/2017   LDLCALC 51 09/07/2019   LDLCALC 63 04/28/2018   Lab Results  Component Value Date   TSH 1.290 04/28/2018   TSH 2.570 06/01/2017    Therapeutic Level Labs: No results found for: LITHIUM Lab Results  Component Value Date   VALPROATE 52 10/22/2017   VALPROATE 43 (L) 06/01/2017   No components found for:  CBMZ  Current Medications: Current Outpatient Medications  Medication Sig Dispense Refill   ACCU-CHEK GUIDE test strip USE TO CHECK BLOOD SUGAR TWICE DAILY 100 strip 2   albuterol (VENTOLIN HFA) 108 (90 Base) MCG/ACT inhaler INHALE 2 PUFFS INTO THE LUNGS EVERY 6 HOURS AS NEEDED FOR WHEEZING OR SHORTNESS OF BREATH 25.5 g 3   amantadine (SYMMETREL) 100 MG capsule Take 1 capsule (100 mg total) by mouth 2 (two) times daily. 180 capsule 0   atorvastatin (LIPITOR) 20 MG tablet TAKE 1 TABLET BY MOUTH DAILY 90 tablet 1   clonazePAM (KLONOPIN) 0.5 MG tablet Take 0.5 tablets (0.25 mg total) by mouth 2 (two) times daily as needed for anxiety. 30 tablet 2   doxycycline (VIBRAMYCIN) 50 MG capsule TAKE 1 CAPSULE(50 MG) BY MOUTH EVERY MORNING 90 capsule 0   fluticasone (FLOVENT HFA) 110  MCG/ACT inhaler Inhale 1 puff into the lungs 2 (two) times daily. 1 Inhaler 11   gabapentin (NEURONTIN) 600 MG tablet Take 1 tablet (600 mg total) by mouth 3 (three) times daily. 270 tablet 0   hydrochlorothiazide (HYDRODIURIL) 12.5 MG tablet TAKE 1 TABLET(12.5 MG) BY MOUTH DAILY 90 tablet 1   insulin degludec (TRESIBA FLEXTOUCH) 100 UNIT/ML FlexTouch Pen INJECT 0.4 MLS(40 UNITS TOTAL) UNDER THE SKIN DAILY 15 mL 5   Insulin Pen Needle (BD PEN NEEDLE NANO U/F) 32G X 4 MM MISC 1 each by Does not apply route daily. 100 each 12   MELATONIN PO Take 30 mg by mouth. Taking 1 tablet daily at night  meloxicam (MOBIC) 15 MG tablet TAKE 1 TABLET(15 MG) BY MOUTH DAILY 30 tablet 0   metFORMIN (GLUCOPHAGE) 500 MG tablet TAKE 2 TABLETS(1000 MG) BY MOUTH TWICE DAILY WITH A MEAL 360 tablet 0   NOVOTWIST 32G X 5 MM MISC USE WITH LEVEMIR PEN BID  5   omeprazole (PRILOSEC) 10 MG capsule TAKE 1 CAPSULE BY MOUTH DAILY. 90 capsule 1   paliperidone (INVEGA SUSTENNA) 234 MG/1.5ML SUSY injection Inject 234 mg into the muscle once for 1 dose. 1.5 mL 11   perphenazine (TRILAFON) 4 MG tablet Take 1 tablet (4 mg total) by mouth at bedtime. 90 tablet 0   phenazopyridine (PYRIDIUM) 200 MG tablet Take 1 tablet (200 mg total) by mouth 3 (three) times daily as needed for pain. 10 tablet 0   pioglitazone (ACTOS) 45 MG tablet TAKE 1 TABLET(45 MG) BY MOUTH DAILY 90 tablet 1   Semaglutide,0.25 or 0.5MG /DOS, (OZEMPIC, 0.25 OR 0.5 MG/DOSE,) 2 MG/1.5ML SOPN Inject 0.375 mLs (0.5 mg total) into the skin once a week. 1 pen 5   sulfamethoxazole-trimethoprim (BACTRIM DS) 800-160 MG tablet Take 1 tablet by mouth 2 (two) times daily. 10 tablet 0   Vilazodone HCl (VIIBRYD) 40 MG TABS Take 1 tablet (40 mg total) by mouth daily. 90 tablet 0   Current Facility-Administered Medications  Medication Dose Route Frequency Provider Last Rate Last Admin   paliperidone (INVEGA SUSTENNA) injection 234 mg  234 mg Intramuscular Q30 days  Zena Amos, MD   234 mg at 04/10/20 0935     Musculoskeletal: Strength & Muscle Tone: unable to assess due to telemed visit Gait & Station: unable to assess due to telemed visit Patient leans: unable to assess due to telemed visit  Psychiatric Specialty Exam: ROS  There were no vitals taken for this visit.There is no height or weight on file to calculate BMI.  General Appearance: unable to assess due to phone encounter  Eye Contact:  unable to assess due to phone encounter  Speech:  Clear and Coherent and Normal Rate  Volume:  Normal  Mood:  Euthymic  Affect:  Restricted  Thought Process:  Goal Directed, Linear and Descriptions of Associations: Intact  Orientation:  Full (Time, Place, and Person)  Thought Content: Hallucinations: Auditory and Paranoid Ideations present  Suicidal Thoughts:  No  Homicidal Thoughts:  No  Memory:  Recent;   Good Remote;   Good  Judgement:  Fair  Insight:  Fair  Psychomotor Activity:  Decreased  Concentration:  Concentration: Good and Attention Span: Good  Recall:  Good  Fund of Knowledge: Good  Language: Good  Akathisia:  No  Handed:  Right  AIMS (if indicated): unable to complete due to telemed visit  Assets:  Communication Skills Desire for Improvement Financial Resources/Insurance Housing Social Support  ADL's:  Intact  Cognition: WNL  Sleep:  Fair   Screenings: Astronomer Office Visit from 05/12/2017 in [facility] Office Visit from 07/29/2016 in [facility] Office Visit from 05/06/2016 in [facility] Office Visit from 04/01/2016 in [facility] Office Visit from 03/28/2015 in [facility]  AIMS Total Score 0 0 0 0 0    GAD-7   Flowsheet Row Office Visit from 04/28/2018 in [facility]  Total GAD-7 Score 8    Mini-Mental   Flowsheet Row Office Visit from 09/07/2019 in  [facility]  Total Score (max 30 points ) 16    PHQ2-9  Flowsheet Row Clinical [facility] Visit from [facility]/28/2018 in [facility] Clinical [facility] from [facility]13/2018 in [facility] Clinical [facility]2017 [facility]  PHQ-2 Total Score 0 4 2 2 3   PHQ-9 Total Score -- 11 8 12 8        Assessment and Plan: Patient seen a few days earlier than her scheduled appointment as per patient request.  Patient reported increase in better delusions however she just received her dose of IM [place] earlier this morning.  Writer advised to take clonazepam as needed for anxiety and that her paranoid delusions should improve over the next 48 hours since she got the dose of [place] earlier today.  1. Schizoaffective disorder, depressive type (HCC) - Continue Invega Sust[place] 234 mg qmonthly. Received scheduled dose today. - Vilazodone HCl (VIIBRYD) 40 MG TABS; Take 1 tablet (40 mg total) by mouth every morning. Dispense: 90 tablet; Refill: 1 - gabapentin (NEURONTIN) 600 MG tablet; Take 1 tablet (600 mg total) by mouth 3 (three) times daily. Dispense: 270 tablet; Refill: 1 - amantadine (SYMMETREL) 100 MG capsule; Take 1 capsule (100 mg total) by mouth 2 (two) times daily. Dispense: 180 capsule; Refill: 1 - Continue Perphenazine 4 mg HS.  2. Obsessive-compulsive disorder, unspecified type  - Vilazodone HCl (VIIBRYD) 40 MG TABS; Take 1 tablet (40 mg total) by mouth every morning. Dispense: 90 tablet; Refill: 1 - clonazePAM (KLONOPIN) 0.5 MG tablet; Take 0.5 tablets (0.25 mg total) by mouth 2 (two) times daily as needed for anxiety. Dispense: 30 tablet; Refill: 2   Continue monthly IM Invega injections.  F/up in 6 weeks.   Zena AmosMandeep Mckenzey Parcell, MD 04/10/2020, 2:12 PM

## 2020-04-12 ENCOUNTER — Other Ambulatory Visit: Payer: Self-pay

## 2020-04-12 ENCOUNTER — Ambulatory Visit: Payer: Medicare Other

## 2020-04-15 ENCOUNTER — Telehealth (HOSPITAL_COMMUNITY): Payer: Medicare Other | Admitting: Psychiatry

## 2020-04-15 ENCOUNTER — Other Ambulatory Visit: Payer: Self-pay

## 2020-04-15 ENCOUNTER — Encounter: Payer: Self-pay | Admitting: Family Medicine

## 2020-04-15 ENCOUNTER — Ambulatory Visit (INDEPENDENT_AMBULATORY_CARE_PROVIDER_SITE_OTHER): Payer: Medicare Other | Admitting: Family Medicine

## 2020-04-15 VITALS — BP 142/71 | HR 86 | Temp 97.8°F | Resp 17 | Ht 62.0 in | Wt 244.2 lb

## 2020-04-15 DIAGNOSIS — Z7689 Persons encountering health services in other specified circumstances: Secondary | ICD-10-CM | POA: Diagnosis not present

## 2020-04-15 DIAGNOSIS — H9202 Otalgia, left ear: Secondary | ICD-10-CM | POA: Diagnosis not present

## 2020-04-15 NOTE — Patient Instructions (Signed)
Continue all medications as prescribed  We will plan to see you back in 6 months for hypertension follow up visit  You will receive a survey after today's visit either digitally by e-mail or paper by USPS mail. Your experiences and feedback matter to us.  Please respond so we know how we are doing as we provide care for you.  Call us with any questions/concerns/needs.  It is my goal to be available to you for your health concerns.  Thanks for choosing me to be a partner in your healthcare needs!  Mckinsley Koelzer Marie Anjel Perfetti, FNP-C Family Nurse Practitioner [facility] Phone: [phone]  

## 2020-04-15 NOTE — Progress Notes (Signed)
Subjective:    Patient ID: Tabitha [name], female    DOB: 27-Jan-1971, 49 y.o.   MRN: [id number]  Tabitha [name] is a 49 y.o. female presenting on 04/15/2020 for Establish Care   HPI  [name] presents to clinic to establish for primary care.  Previous PCP was at [facility].  Records will not be requested, as are available in the EMR.  Past medical, family, and surgical history reviewed w/ pt.  She has acute concerns today for left ear discomfort and hearing loss for a few months.  Denies any recent illness, putting anything into the ear, any vertigo/dizziness, drainage from ear, fevers, ear swelling.  Depression screen [facility] 2/9 01/19/2019 04/28/2018 01/13/2018  Decreased Interest 0 2 1  Down, Depressed, Hopeless 0 2 1  PHQ - 2 Score 0 4 2  Altered sleeping - 1 1  Tired, decreased energy - 2 1  Change in appetite - 2 1  Feeling bad or failure about yourself  - 1 1  Trouble concentrating - 1 1  Moving slowly or fidgety/restless - 0 1  Suicidal thoughts - 0 0  PHQ-9 Score - 11 8  Difficult doing work/chores - - Not difficult at all  Some recent data might be hidden    Social History   Tobacco Use  . Smoking status: Never Smoker  . Smokeless tobacco: Never Used  Vaping Use  . Vaping Use: Never used  Substance Use Topics  . Alcohol use: No    Alcohol/week: 0.0 standard drinks  . Drug use: No    Review of Systems  Constitutional: Negative.   HENT: Positive for ear pain. Negative for congestion, dental problem, drooling, ear discharge, facial swelling, hearing loss, mouth sores, nosebleeds, postnasal drip, rhinorrhea, sinus pressure, sinus pain, sneezing, sore throat, tinnitus, trouble swallowing and voice change.   Eyes: Negative.   Respiratory: Negative.   Cardiovascular: Negative.   Gastrointestinal: Negative.   Endocrine: Negative.   Genitourinary: Negative.   Musculoskeletal: Negative.   Skin: Negative.   Allergic/Immunologic: Negative.    Neurological: Negative.   Hematological: Negative.   Psychiatric/Behavioral: Negative.    Per HPI unless specifically indicated above     Objective:    BP (!) 142/71 (BP Location: Right Arm, Patient Position: Sitting, Cuff Size: Large)   Pulse 86   Temp 97.8 F (36.6 C) (Temporal)   Resp 17   Ht 5\' 2"  (1.575 m)   Wt 244 lb 3.2 oz (110.8 kg)   SpO2 100%   BMI 44.66 kg/m   Wt Readings from Last 3 Encounters:  04/15/20 244 lb 3.2 oz (110.8 kg)  12/18/19 236 lb (107 kg)  10/17/19 235 lb (106.6 kg)    Physical Exam Vitals and nursing note reviewed.  Constitutional:      General: She is not in acute distress.    Appearance: Normal appearance. She is well-developed and well-groomed. She is not ill-appearing or toxic-appearing.  HENT:     Head: Normocephalic and atraumatic.     Right Ear: Tympanic membrane and external ear normal. There is impacted cerumen. Tympanic membrane is not injected, scarred, perforated, erythematous or bulging.     Left Ear: Tympanic membrane and external ear normal. There is impacted cerumen. Tympanic membrane is not injected, scarred, perforated, erythematous or bulging.     Ears:     Comments: TM clear post cerumen removal    Nose:     Comments: Facemask is in place, covering mouth and nose.  Eyes:     General: Lids are normal. Vision grossly intact.        Right eye: No discharge.        Left eye: No discharge.     Extraocular Movements: Extraocular movements intact.     Conjunctiva/sclera: Conjunctivae normal.     Pupils: Pupils are equal, round, and reactive to light.  Cardiovascular:     Rate and Rhythm: Normal rate and regular rhythm.     Pulses: Normal pulses.     Heart sounds: Normal heart sounds. No murmur heard. No friction rub. No gallop.   Pulmonary:     Effort: Pulmonary effort is normal. No respiratory distress.     Breath sounds: Normal breath sounds.  Skin:    General: Skin is warm and dry.     Capillary Refill: Capillary  refill takes less than 2 seconds.  Neurological:     General: No focal deficit present.     Mental Status: She is alert and oriented to person, place, and time.  Psychiatric:        Attention and Perception: Attention and perception normal.        Mood and Affect: Mood and affect normal.        Speech: Speech normal.        Behavior: Behavior normal. Behavior is cooperative.        Thought Content: Thought content normal.        Cognition and Memory: Cognition and memory normal.        Judgment: Judgment normal.    ________________________________________________________ PROCEDURE NOTE Date: 04/15/2020 Bilateral Ear Lavage / Cerumen Removal Discussed benefits and risks (including pain / discomforts, dizziness, minor abrasion of ear canal). Verbal consent given by patient. Medication:  carbamide peroxide ear drops, Ear Lavage Solution (warm water + hydrogen peroxide) Performed by Tedd Sias, CMA Several drops of carbamide peroxide placed in each ear, allowed to sit for few minutes. Ear lavage solution flushed into one ear at a time in attempt to dislodge and remove ear wax.   Repeat Ear Exam: - Completely removed cerumen now, with clear ear canals and visible TMs clear and normal appearance.   Results for orders placed or performed in visit on 12/18/19  Microscopic Examination   Urine  Result Value Ref Range   WBC, UA >30 (A) 0 - 5 /hpf   RBC >30 (A) 0 - 2 /hpf   Epithelial Cells (non renal) 0-10 0 - 10 /hpf   Bacteria, UA Many (A) None seen/Few  Urine Culture, Reflex   Urine  Result Value Ref Range   Urine Culture, Routine Final report (A)    Organism ID, Bacteria Escherichia coli (A)    Antimicrobial Susceptibility Comment   UA/M w/rflx Culture, Routine   Specimen: Urine   Urine  Result Value Ref Range   Specific Gravity, UA 1.015 1.005 - 1.030   pH, UA 5.0 5.0 - 7.5   Color, UA Yellow Yellow   Appearance Ur Cloudy (A) Clear   Leukocytes,UA 3+ (A) Negative   Protein,UA  3+ (A) Negative/Trace   Glucose, UA Negative Negative   Ketones, UA Negative Negative   RBC, UA 3+ (A) Negative   Bilirubin, UA Negative Negative   Urobilinogen, Ur 0.2 0.2 - 1.0 mg/dL   Nitrite, UA Negative Negative   Microscopic Examination See below:    Urinalysis Reflex Comment       Assessment & Plan:   Problem List Items Addressed This Visit  Other   Encounter to establish care with new doctor - Primary    New patient establishment at [facility] for primary care.        Left ear pain    Left ear discomfort for a few months with reported decreased hearing.  Cerumen impacted, ear lavaged and ear wax removed.  Reports good symptom improvement of ear discomfort and improved hearing.  Encourage to use debrox ear drops, according to packaging directions, to avoid ear wax accumulation         No orders of the defined types were placed in this encounter.  Follow up plan: Return in about 6 months (around 10/14/2020) for HTN F/U.   Charlaine Dalton, FNP Family Nurse Practitioner [facility] 04/15/2020, 12:37 PM

## 2020-04-15 NOTE — Assessment & Plan Note (Signed)
Left ear discomfort for a few months with reported decreased hearing.  Cerumen impacted, ear lavaged and ear wax removed.  Reports good symptom improvement of ear discomfort and improved hearing.  Encourage to use debrox ear drops, according to packaging directions, to avoid ear wax accumulation

## 2020-04-15 NOTE — Assessment & Plan Note (Signed)
New patient establishment at [facility] for primary care.   

## 2020-04-28 ENCOUNTER — Other Ambulatory Visit: Payer: Self-pay | Admitting: Family Medicine

## 2020-04-28 DIAGNOSIS — Z794 Long term (current) use of insulin: Secondary | ICD-10-CM

## 2020-04-28 DIAGNOSIS — E119 Type 2 diabetes mellitus without complications: Secondary | ICD-10-CM

## 2020-04-29 NOTE — Telephone Encounter (Signed)
Patient has established care with a new office and pcp. This request needs to be sent to Danielle Rankin at [facility]. Due to change in provider this request is refused.

## 2020-05-01 ENCOUNTER — Encounter: Payer: Self-pay | Admitting: Family Medicine

## 2020-05-09 ENCOUNTER — Other Ambulatory Visit: Payer: Self-pay

## 2020-05-09 ENCOUNTER — Ambulatory Visit (INDEPENDENT_AMBULATORY_CARE_PROVIDER_SITE_OTHER): Payer: Medicare Other | Admitting: *Deleted

## 2020-05-09 VITALS — BP 155/83 | HR 87 | Ht 62.0 in | Wt 248.0 lb

## 2020-05-09 DIAGNOSIS — F251 Schizoaffective disorder, depressive type: Secondary | ICD-10-CM

## 2020-05-09 NOTE — Progress Notes (Signed)
Patient given injection in Right upper outer quadrant.  Patient reports doing well.  Patient tolerated injection well.  Will return in 4 weeks.

## 2020-05-24 ENCOUNTER — Other Ambulatory Visit: Payer: Self-pay

## 2020-05-24 ENCOUNTER — Telehealth (INDEPENDENT_AMBULATORY_CARE_PROVIDER_SITE_OTHER): Payer: Medicare Other | Admitting: Psychiatry

## 2020-05-24 ENCOUNTER — Encounter (HOSPITAL_COMMUNITY): Payer: Self-pay | Admitting: Psychiatry

## 2020-05-24 DIAGNOSIS — F251 Schizoaffective disorder, depressive type: Secondary | ICD-10-CM | POA: Diagnosis not present

## 2020-05-24 DIAGNOSIS — F429 Obsessive-compulsive disorder, unspecified: Secondary | ICD-10-CM

## 2020-05-24 MED ORDER — PERPHENAZINE 4 MG PO TABS: 4.0000 mg | ORAL_TABLET | Freq: Every day | ORAL | 0 refills | Status: DC

## 2020-05-24 MED ORDER — CLONAZEPAM 0.5 MG PO TABS: 0.2500 mg | ORAL_TABLET | Freq: Two times a day (BID) | ORAL | 2 refills | Status: DC | PRN

## 2020-05-24 MED ORDER — GABAPENTIN 600 MG PO TABS
600.0000 mg | ORAL_TABLET | Freq: Three times a day (TID) | ORAL | 0 refills | Status: DC
Start: 2020-05-24 — End: 2020-07-17

## 2020-05-24 MED ORDER — AMANTADINE HCL 100 MG PO CAPS: 100.0000 mg | ORAL_CAPSULE | Freq: Two times a day (BID) | ORAL | 0 refills | Status: DC

## 2020-05-24 MED ORDER — VIIBRYD 40 MG PO TABS: 40.0000 mg | ORAL_TABLET | Freq: Every day | ORAL | 0 refills | Status: DC

## 2020-05-24 NOTE — Progress Notes (Signed)
BH MD OP Progress Note  Virtual Visit via Video Note  I connected with Tabitha [name] on 05/24/20 at 11:20 AM EST by a video enabled telemedicine application and verified that I am speaking with the correct person using two identifiers.  Location: Patient: Home Provider: Clinic   I discussed the limitations of evaluation and management by telemedicine and the availability of in person appointments. The patient expressed understanding and agreed to proceed.  I provided 14 minutes of non-face-to-face time during this encounter.       05/24/2020 11:30 AM Tabitha [name]  MRN:  [id number]  Chief Complaint:  "I am feeling much better than before."  HPI: At the time of her last visit, patient reported that she was not feeling well.  She had significant increase in her paranoid delusions and anxiety.  She had missed her monthly Invega injection dose in November because of Covid infection.  Writer advised her to take her clonazepam on a regular basis and have suggested that maybe we should have her come in for her [place] injection a few days sooner than the next dose.  Today, Patient reported that she is feeling much better now.  She stated that she had a great Christmas with the family and since then her mood has been stable.  She stated that the paranoid delusions and intrusive thoughts have improved significantly.  She said yesterday she had some paranoid delusion of somebody trying to encroach the kitchen wall however other than that she feels things are much controlled now. She denies any other concerns or issues at this time. She informed her sons (36 and 80 y/o) and husband are doing well.   Visit Diagnosis:    ICD-10-CM   1. Schizoaffective disorder, depressive type (HCC)  F25.1 amantadine (SYMMETREL) 100 MG capsule    gabapentin (NEURONTIN) 600 MG tablet    perphenazine (TRILAFON) 4 MG tablet    Vilazodone HCl (VIIBRYD) 40 MG TABS  2. Obsessive-compulsive disorder,  unspecified type  F42.9 clonazePAM (KLONOPIN) 0.5 MG tablet    Vilazodone HCl (VIIBRYD) 40 MG TABS    Past Psychiatric History: Schizoaffective disorder, OCD  Past Medical History:  Past Medical History:  Diagnosis Date  . Allergic rhinitis   . Anxiety   . Asthma   . Bipolar disorder (HCC)   . Depression   . Diabetes mellitus without complication (HCC)   . GERD (gastroesophageal reflux disease)   . Hyperlipidemia   . Hypertension   . Mild sleep apnea   . OCD (obsessive compulsive disorder)   . Palpitations   . Paranoia (HCC)   . Schizoaffective disorder (HCC)   . Sleep apnea     Past Surgical History:  Procedure Laterality Date  . CESAREAN SECTION  2006/2008  . CHOLECYSTECTOMY  2006    Family Psychiatric History: denied  Family History:  Family History  Problem Relation Age of Onset  . Diabetes Father   . Hyperlipidemia Father   . Diabetes Brother   . Lupus Mother   . Diabetes Mother        pre diabetes    Social History:  Social History   Socioeconomic History  . Marital status: Married    Spouse name: kevin  . Number of children: 2  . Years of education: Not on file  . Highest education level: Bachelor's degree (49 years, e.g., BA, AB, BS)  Occupational History  . Not on file  Tobacco Use  . Smoking status: Never Smoker  . Smokeless tobacco:  Never Used  Vaping Use  . Vaping Use: Never used  Substance and Sexual Activity  . Alcohol use: No    Alcohol/week: 0.0 standard drinks  . Drug use: No  . Sexual activity: Yes    Partners: Male    Birth control/protection: None, Surgical  Other Topics Concern  . Not on file  Social History Narrative  . Not on file   Social Determinants of Health   Financial Resource Strain: Not on file  Food Insecurity: Not on file  Transportation Needs: Not on file  Physical Activity: Not on file  Stress: Not on file  Social Connections: Not on file    Allergies:  Allergies  Allergen Reactions  . Abilify [Aripiprazole]  Hives    Metabolic Disorder Labs: Lab Results  Component Value Date   HGBA1C 6.6 (H) 09/07/2019   Lab Results  Component Value Date   PROLACTIN 84.8 (H) 06/01/2017   PROLACTIN 36.2 (H) 03/26/2016   Lab Results  Component Value Date   CHOL 138 09/07/2019   TRIG 202 (H) 09/07/2019   HDL 55 09/07/2019   CHOLHDL 2.9 09/26/2015   VLDL 51 (H) 04/23/2017   LDLCALC 51 09/07/2019   LDLCALC 63 04/28/2018   Lab Results  Component Value Date   TSH 1.290 04/28/2018   TSH 2.570 06/01/2017    Therapeutic Level Labs: No results found for: LITHIUM Lab Results  Component Value Date   VALPROATE 52 10/22/2017   VALPROATE 43 (L) 06/01/2017   No components found for:  CBMZ  Current Medications: Current Outpatient Medications  Medication Sig Dispense Refill  . ACCU-CHEK GUIDE test strip USE TO CHECK BLOOD SUGAR TWICE DAILY 100 strip 2  . albuterol (VENTOLIN HFA) 108 (90 Base) MCG/ACT inhaler INHALE 2 PUFFS INTO THE LUNGS EVERY 6 HOURS AS NEEDED FOR WHEEZING OR SHORTNESS OF BREATH 25.5 g 3  . amantadine (SYMMETREL) 100 MG capsule Take 1 capsule (100 mg total) by mouth 2 (two) times daily. 180 capsule 0  . atorvastatin (LIPITOR) 20 MG tablet TAKE 1 TABLET BY MOUTH DAILY 90 tablet 1  . clonazePAM (KLONOPIN) 0.5 MG tablet Take 0.5 tablets (0.25 mg total) by mouth 2 (two) times daily as needed for anxiety. 30 tablet 2  . doxycycline (VIBRAMYCIN) 50 MG capsule TAKE 1 CAPSULE(50 MG) BY MOUTH EVERY MORNING 90 capsule 0  . fluticasone (FLOVENT HFA) 110 MCG/ACT inhaler Inhale 1 puff into the lungs 2 (two) times daily. 1 Inhaler 11  . gabapentin (NEURONTIN) 600 MG tablet Take 1 tablet (600 mg total) by mouth 3 (three) times daily. 270 tablet 0  . hydrochlorothiazide (HYDRODIURIL) 12.5 MG tablet TAKE 1 TABLET(12.5 MG) BY MOUTH DAILY 90 tablet 1  . insulin degludec (TRESIBA FLEXTOUCH) 100 UNIT/ML FlexTouch Pen INJECT 0.4 MLS(40 UNITS TOTAL) UNDER THE SKIN DAILY 15 mL 5  . Insulin Pen Needle (BD PEN  NEEDLE NANO U/F) 32G X 4 MM MISC 1 each by Does not apply route daily. 100 each 12  . MELATONIN PO Take 30 mg by mouth. Taking 1 tablet daily at night    . meloxicam (MOBIC) 15 MG tablet TAKE 1 TABLET(15 MG) BY MOUTH DAILY 30 tablet 0  . metFORMIN (GLUCOPHAGE) 500 MG tablet TAKE 2 TABLETS(1000 MG) BY MOUTH TWICE DAILY WITH A MEAL 360 tablet 0  . NOVOTWIST 32G X 5 MM MISC USE WITH LEVEMIR PEN BID  5  . omeprazole (PRILOSEC) 10 MG capsule TAKE 1 CAPSULE BY MOUTH DAILY. 90 capsule 1  . paliperidone (  INVEGA SUSTENNA) 234 MG/1.5ML SUSY injection Inject 234 mg into the muscle once for 1 dose. 1.5 mL 11  . perphenazine (TRILAFON) 4 MG tablet Take 1 tablet (4 mg total) by mouth at bedtime. 90 tablet 0  . phenazopyridine (PYRIDIUM) 200 MG tablet Take 1 tablet (200 mg total) by mouth 3 (three) times daily as needed for pain. 10 tablet 0  . pioglitazone (ACTOS) 45 MG tablet TAKE 1 TABLET(45 MG) BY MOUTH DAILY 90 tablet 1  . Semaglutide,0.25 or 0.5MG /DOS, (OZEMPIC, 0.25 OR 0.5 MG/DOSE,) 2 MG/1.5ML SOPN Inject 0.375 mLs (0.5 mg total) into the skin once a week. 1 pen 5  . Vilazodone HCl (VIIBRYD) 40 MG TABS Take 1 tablet (40 mg total) by mouth daily. 90 tablet 0   Current Facility-Administered Medications  Medication Dose Route Frequency Provider Last Rate Last Admin  . paliperidone (INVEGA SUSTENNA) injection 234 mg  234 mg Intramuscular Q30 days Zena Amos, MD   234 mg at 05/09/20 1014     Musculoskeletal: Strength & Muscle Tone: unable to assess due to telemed visit Gait & Station: unable to assess due to telemed visit Patient leans: unable to assess due to telemed visit  Psychiatric Specialty Exam: ROS  There were no vitals taken for this visit.There is no height or weight on file to calculate BMI.  General Appearance: Fairly Groomed  Eye Contact:  Fair  Speech:  Clear and Coherent and Normal Rate  Volume:  Normal  Mood:  Euthymic  Affect:  Restricted  Thought Process:  Goal Directed,  Linear and Descriptions of Associations: Intact  Orientation:  Full (Time, Place, and Person)  Thought Content: Hallucinations: Auditory and Paranoid Ideations present- at baseline  Suicidal Thoughts:  No  Homicidal Thoughts:  No  Memory:  Recent;   Good Remote;   Good  Judgement:  Fair  Insight:  Fair  Psychomotor Activity:  Decreased  Concentration:  Concentration: Good and Attention Span: Good  Recall:  Good  Fund of Knowledge: Good  Language: Good  Akathisia:  No  Handed:  Right  AIMS (if indicated): 0  Assets:  Communication Skills Desire for Improvement Financial Resources/Insurance Housing Social Support  ADL's:  Intact  Cognition: WNL  Sleep:  Fair   Screenings: AIMS   Flowsheet Row Office Visit from 05/12/2017 in [facility] Office Visit from 07/29/2016 in [facility] Office Visit from 05/06/2016 in [facility] Office Visit from 04/01/2016 in [facility] Office Visit from 03/28/2015 in [facility]  AIMS Total Score 0 0 0 0 0    GAD-7   Flowsheet Row Office Visit from 04/28/2018 in [facility]  Total GAD-7 Score 8    Mini-Mental   Flowsheet Row Office Visit from 09/07/2019 in [facility]  Total Score (max 30 points ) 16    PHQ2-9   Flowsheet Row Clinical Support from 01/19/2019 in [facility] Office Visit from 04/28/2018 in [facility] Clinical Support from 01/13/2018 in [facility] Clinical Support from 01/13/2017 in [facility] Office Visit from 09/24/2015 in [place]  PHQ-2 Total Score 0 4 2 2 3   PHQ-9 Total Score -- 11 8 12 8        Assessment and Plan: Patient appears to be fairly stable for now.  We will continue the same regimen.  1. Schizoaffective disorder, depressive type (HCC) - Continue Invega Sustenna 234 mg qmonthly.  -  Vilazodone HCl (  VIIBRYD) 40 MG TABS; Take 1 tablet (40 mg total) by mouth every morning. Dispense: 90 tablet; Refill: 1 - gabapentin (NEURONTIN) 600 MG tablet; Take 1 tablet (600 mg total) by mouth 3 (three) times daily. Dispense: 270 tablet; Refill: 1 - amantadine (SYMMETREL) 100 MG capsule; Take 1 capsule (100 mg total) by mouth 2 (two) times daily. Dispense: 180 capsule; Refill: 1 - Continue Perphenazine 4 mg HS.  2. Obsessive-compulsive disorder, unspecified type  - Vilazodone HCl (VIIBRYD) 40 MG TABS; Take 1 tablet (40 mg total) by mouth every morning. Dispense: 90 tablet; Refill: 1 - clonazePAM (KLONOPIN) 0.5 MG tablet; Take 0.5 tablets (0.25 mg total) by mouth 2 (two) times daily as needed for anxiety. Dispense: 30 tablet; Refill: 2   Continue monthly IM Invega injections.  Next dose will be due on or around February 11.  Patient was informed of that.  F/up in 2 months.   Zena Amos, MD 05/24/2020, 11:30 AM

## 2020-06-10 ENCOUNTER — Ambulatory Visit: Payer: Medicare Other

## 2020-06-10 ENCOUNTER — Other Ambulatory Visit: Payer: Self-pay

## 2020-06-12 ENCOUNTER — Other Ambulatory Visit: Payer: Self-pay

## 2020-06-12 ENCOUNTER — Ambulatory Visit (INDEPENDENT_AMBULATORY_CARE_PROVIDER_SITE_OTHER): Payer: Medicare Other

## 2020-06-12 DIAGNOSIS — F251 Schizoaffective disorder, depressive type: Secondary | ICD-10-CM | POA: Diagnosis not present

## 2020-06-12 DIAGNOSIS — F3170 Bipolar disorder, currently in remission, most recent episode unspecified: Secondary | ICD-10-CM

## 2020-06-12 NOTE — Patient Instructions (Signed)
Patient given injection in left  upper outer quadrant.  Patient reports doing well.  Patient tolerated injection well.  Will return in 4 weeks. invega sustenna 234mg / 1.59ml ndc # 4m s/n [id number],  Exp 10-25-21,  Lot # [id number]

## 2020-06-12 NOTE — Progress Notes (Signed)
Patient given injection in left  upper outer quadrant.  Patient reports doing well.  Patient tolerated injection well.  Will return in 4 weeks. invega sustenna 234mg/ 1.5ml ndc # 50458-564-01 s/n [id number],  Exp 10-25-21,  Lot # [id number] 

## 2020-06-14 ENCOUNTER — Other Ambulatory Visit: Payer: Self-pay

## 2020-06-14 DIAGNOSIS — E119 Type 2 diabetes mellitus without complications: Secondary | ICD-10-CM

## 2020-06-14 DIAGNOSIS — Z794 Long term (current) use of insulin: Secondary | ICD-10-CM

## 2020-06-14 MED ORDER — PIOGLITAZONE HCL 45 MG PO TABS: ORAL_TABLET | ORAL | 1 refills | Status: DC

## 2020-07-10 ENCOUNTER — Telehealth: Payer: Self-pay

## 2020-07-10 ENCOUNTER — Other Ambulatory Visit: Payer: Self-pay

## 2020-07-10 ENCOUNTER — Ambulatory Visit (INDEPENDENT_AMBULATORY_CARE_PROVIDER_SITE_OTHER): Payer: Medicare Other

## 2020-07-10 DIAGNOSIS — F429 Obsessive-compulsive disorder, unspecified: Secondary | ICD-10-CM

## 2020-07-10 DIAGNOSIS — F251 Schizoaffective disorder, depressive type: Secondary | ICD-10-CM | POA: Diagnosis not present

## 2020-07-10 DIAGNOSIS — F3170 Bipolar disorder, currently in remission, most recent episode unspecified: Secondary | ICD-10-CM | POA: Diagnosis not present

## 2020-07-10 NOTE — Telephone Encounter (Signed)
pt came in today to get her injection. she stated that she has been having some crying spell and anxiety for the last couple weeks. she said "i dont know what's going on with me" She was fine so far today but her BP was high.

## 2020-07-10 NOTE — Telephone Encounter (Signed)
Thanks for letting me now. I am seeing her in a few days from now.

## 2020-07-10 NOTE — Patient Instructions (Signed)
Patient given injection in right upper outer quadrant.   Patient reports that she is having crying spells and having anxiety.   Patient tolerated injection well.  Will return in 4 weeks. invega sustenna 234mg/ 1.5ml ndc # 50458-564-01 s/n [id number],  Exp 11-25-21,  Lot #[id number] 

## 2020-07-10 NOTE — Progress Notes (Signed)
Patient given injection in right upper outer quadrant.   Patient reports that she is having crying spells and having anxiety.   Patient tolerated injection well.  Will return in 4 weeks. invega sustenna 234mg / 1.67ml ndc # 4m s/n [id number],  Exp 11-25-21,  Lot 01-25-22

## 2020-07-17 ENCOUNTER — Telehealth (INDEPENDENT_AMBULATORY_CARE_PROVIDER_SITE_OTHER): Payer: Medicare Other | Admitting: Psychiatry

## 2020-07-17 ENCOUNTER — Encounter (HOSPITAL_COMMUNITY): Payer: Self-pay | Admitting: Psychiatry

## 2020-07-17 ENCOUNTER — Other Ambulatory Visit: Payer: Self-pay

## 2020-07-17 DIAGNOSIS — F251 Schizoaffective disorder, depressive type: Secondary | ICD-10-CM

## 2020-07-17 DIAGNOSIS — F429 Obsessive-compulsive disorder, unspecified: Secondary | ICD-10-CM

## 2020-07-17 MED ORDER — AMANTADINE HCL 100 MG PO CAPS: 100.0000 mg | ORAL_CAPSULE | Freq: Two times a day (BID) | ORAL | 0 refills | Status: DC

## 2020-07-17 MED ORDER — VIIBRYD 40 MG PO TABS: 40.0000 mg | ORAL_TABLET | Freq: Every day | ORAL | 0 refills | Status: DC

## 2020-07-17 MED ORDER — PERPHENAZINE 4 MG PO TABS: 4.0000 mg | ORAL_TABLET | Freq: Every day | ORAL | 0 refills | Status: DC

## 2020-07-17 MED ORDER — CLONAZEPAM 0.5 MG PO TABS: 0.2500 mg | ORAL_TABLET | Freq: Two times a day (BID) | ORAL | 1 refills | Status: DC | PRN

## 2020-07-17 MED ORDER — GABAPENTIN 600 MG PO TABS: 600.0000 mg | ORAL_TABLET | Freq: Three times a day (TID) | ORAL | 0 refills | Status: DC

## 2020-07-17 NOTE — Progress Notes (Signed)
BH MD OP Progress Note  Virtual Visit via Video Note  I connected with Tabitha [name] on 07/17/20 at  1:40 PM EDT by a video enabled telemedicine application and verified that I am speaking with the correct person using two identifiers.  Location: Patient: Home Provider: Clinic   I discussed the limitations of evaluation and management by telemedicine and the availability of in person appointments. The patient expressed understanding and agreed to proceed.  I provided 17 minutes of non-face-to-face time during this encounter.    07/17/2020 1:44 PM MIKESHA MIGLIACCIO  MRN:  [id number]  Chief Complaint:  " I get tearful sometimes."  HPI: Patient reported she is doing well today.  She informed that she sometimes gets tearful and emotional.  She stated that she has been under a lot of stress.  She informed that her husband is also going through some of his personal stuff.  She denied feeling depressed all the time but sometimes feels her mood is up-and-down. She denied having any suicidal ideations. She stated that sometimes she feels anxious and she takes clonazepam but does not take the clonazepam on a regular basis.  She stated that she usually takes half a tablet and sometimes that does not help. Writer informed her that she can take the clonazepam as needed and May try taking whole tablet (0.5 mg) to see if that helps. She denied any other issues or concerns at present.   Visit Diagnosis:    ICD-10-CM   1. Schizoaffective disorder, depressive type (HCC)  F25.1   2. Obsessive-compulsive disorder, unspecified type  F42.9     Past Psychiatric History: Schizoaffective disorder, OCD  Past Medical History:  Past Medical History:  Diagnosis Date  . Allergic rhinitis   . Anxiety   . Asthma   . Bipolar disorder (HCC)   . Depression   . Diabetes mellitus without complication (HCC)   . GERD (gastroesophageal reflux disease)   . Hyperlipidemia   . Hypertension   . Mild sleep apnea    . OCD (obsessive compulsive disorder)   . Palpitations   . Paranoia (HCC)   . Schizoaffective disorder (HCC)   . Sleep apnea     Past Surgical History:  Procedure Laterality Date  . CESAREAN SECTION  2006/2008  . CHOLECYSTECTOMY  2006    Family Psychiatric History: denied  Family History:  Family History  Problem Relation Age of Onset  . Diabetes Father   . Hyperlipidemia Father   . Diabetes Brother   . Lupus Mother   . Diabetes Mother        pre diabetes    Social History:  Social History   Socioeconomic History  . Marital status: Married    Spouse name: kevin  . Number of children: 2  . Years of education: Not on file  . Highest education level: Bachelor's degree (e.g., BA, AB, BS)  Occupational History  . Not on file  Tobacco Use  . Smoking status: Never Smoker  . Smokeless tobacco: Never Used  Vaping Use  . Vaping Use: Never used  Substance and Sexual Activity  . Alcohol use: No    Alcohol/week: 0.0 standard drinks  . Drug use: No  . Sexual activity: Yes    Partners: Male    Birth control/protection: None, Surgical  Other Topics Concern  . Not on file  Social History Narrative  . Not on file   Social Determinants of Health   Financial Resource Strain: Not on file  Food Insecurity: Not on file  Transportation Needs: Not on file  Physical Activity: Not on file  Stress: Not on file  Social Connections: Not on file    Allergies:  Allergies  Allergen Reactions  . Abilify [Aripiprazole] Hives    Metabolic Disorder Labs: Lab Results  Component Value Date   HGBA1C 6.6 (H) 09/07/2019   Lab Results  Component Value Date   PROLACTIN 84.8 (H) 06/01/2017   PROLACTIN 36.2 (H) 03/26/2016   Lab Results  Component Value Date   CHOL 138 09/07/2019   TRIG 202 (H) 09/07/2019   HDL 55 09/07/2019   CHOLHDL 2.9 09/26/2015   VLDL 51 (H) 04/23/2017   LDLCALC 51 09/07/2019   LDLCALC 63 04/28/2018   Lab Results  Component Value Date   TSH 1.290  04/28/2018   TSH 2.570 06/01/2017    Therapeutic Level Labs: No results found for: LITHIUM Lab Results  Component Value Date   VALPROATE 52 10/22/2017   VALPROATE 43 (L) 06/01/2017   No components found for:  CBMZ  Current Medications: Current Outpatient Medications  Medication Sig Dispense Refill  . ACCU-CHEK GUIDE test strip USE TO CHECK BLOOD SUGAR TWICE DAILY 100 strip 2  . albuterol (VENTOLIN HFA) 108 (90 Base) MCG/ACT inhaler INHALE 2 PUFFS INTO THE LUNGS EVERY 6 HOURS AS NEEDED FOR WHEEZING OR SHORTNESS OF BREATH 25.5 g 3  . amantadine (SYMMETREL) 100 MG capsule Take 1 capsule (100 mg total) by mouth 2 (two) times daily. 180 capsule 0  . atorvastatin (LIPITOR) 20 MG tablet TAKE 1 TABLET BY MOUTH DAILY 90 tablet 1  . clonazePAM (KLONOPIN) 0.5 MG tablet Take 0.5 tablets (0.25 mg total) by mouth 2 (two) times daily as needed for anxiety. 30 tablet 2  . doxycycline (VIBRAMYCIN) 50 MG capsule TAKE 1 CAPSULE(50 MG) BY MOUTH EVERY MORNING 90 capsule 0  . fluticasone (FLOVENT HFA) 110 MCG/ACT inhaler Inhale 1 puff into the lungs 2 (two) times daily. 1 Inhaler 11  . gabapentin (NEURONTIN) 600 MG tablet Take 1 tablet (600 mg total) by mouth 3 (three) times daily. 270 tablet 0  . hydrochlorothiazide (HYDRODIURIL) 12.5 MG tablet TAKE 1 TABLET(12.5 MG) BY MOUTH DAILY 90 tablet 1  . insulin degludec (TRESIBA FLEXTOUCH) 100 UNIT/ML FlexTouch Pen INJECT 0.4 MLS(40 UNITS TOTAL) UNDER THE SKIN DAILY 15 mL 5  . Insulin Pen Needle (BD PEN NEEDLE NANO U/F) 32G X 4 MM MISC 1 each by Does not apply route daily. 100 each 12  . MELATONIN PO Take 30 mg by mouth. Taking 1 tablet daily at night    . meloxicam (MOBIC) 15 MG tablet TAKE 1 TABLET(15 MG) BY MOUTH DAILY 30 tablet 0  . metFORMIN (GLUCOPHAGE) 500 MG tablet TAKE 2 TABLETS(1000 MG) BY MOUTH TWICE DAILY WITH A MEAL 360 tablet 0  . NOVOTWIST 32G X 5 MM MISC USE WITH LEVEMIR PEN BID  5  . omeprazole (PRILOSEC) 10 MG capsule TAKE 1 CAPSULE BY MOUTH  DAILY. 90 capsule 1  . paliperidone (INVEGA SUSTENNA) 234 MG/1.5ML SUSY injection Inject 234 mg into the muscle once for 1 dose. 1.5 mL 11  . perphenazine (TRILAFON) 4 MG tablet Take 1 tablet (4 mg total) by mouth at bedtime. 90 tablet 0  . phenazopyridine (PYRIDIUM) 200 MG tablet Take 1 tablet (200 mg total) by mouth 3 (three) times daily as needed for pain. 10 tablet 0  . pioglitazone (ACTOS) 45 MG tablet TAKE 1 TABLET(45 MG) BY MOUTH DAILY 90 tablet 1  .  Semaglutide,0.25 or 0.5MG /DOS, (OZEMPIC, 0.25 OR 0.5 MG/DOSE,) 2 MG/1.5ML SOPN Inject 0.375 mLs (0.5 mg total) into the skin once a week. 1 pen 5  . Vilazodone HCl (VIIBRYD) 40 MG TABS Take 1 tablet (40 mg total) by mouth daily. 90 tablet 0   Current Facility-Administered Medications  Medication Dose Route Frequency Provider Last Rate Last Admin  . paliperidone (INVEGA SUSTENNA) injection 234 mg  234 mg Intramuscular Q30 days Zena AmosKaur, Mandeep, MD   234 mg at 07/10/20 1037     Musculoskeletal: Strength & Muscle Tone: unable to assess due to telemed visit Gait & Station: unable to assess due to telemed visit Patient leans: unable to assess due to telemed visit  Psychiatric Specialty Exam: ROS  There were no vitals taken for this visit.There is no height or weight on file to calculate BMI.  General Appearance: Fairly Groomed  Eye Contact:  Fair  Speech:  Clear and Coherent and Normal Rate  Volume:  Normal  Mood:  Euthymic  Affect:  Restricted  Thought Process:  Goal Directed, Linear and Descriptions of Associations: Intact  Orientation:  Full (Time, Place, and Person)  Thought Content: Hallucinations: Auditory and Paranoid Ideations present- at baseline  Suicidal Thoughts:  No  Homicidal Thoughts:  No  Memory:  Recent;   Good Remote;   Good  Judgement:  Fair  Insight:  Fair  Psychomotor Activity:  Decreased  Concentration:  Concentration: Good and Attention Span: Good  Recall:  Good  Fund of Knowledge: Good  Language: Good   Akathisia:  No  Handed:  Right  AIMS (if indicated): 0  Assets:  Communication Skills Desire for Improvement Financial Resources/Insurance Housing Social Support  ADL's:  Intact  Cognition: WNL  Sleep:  Fair   Screenings: AIMS   Flowsheet Row [facility] Visit [facility]2016 [facility]  AIMS Total Score 0 0 0 0 0    GAD-7   Flowsheet Row [facility] Visit [facility] 04/28/2018 in [facility]  Total GAD-7 Score 8    Mini-Mental   Flowsheet Row [facility]  Total Score (max 30 points ) 16    PHQ2-9   Flowsheet Row [facility]/2018 [facility]2018 [facility]  PHQ-2 Total Score 0 4 2 2 3   PHQ-9 Total Score - 11 8 12 8        Assessment and Plan: Patient appears to be fairly stable for now.  We will continue the same regimen.  1. Schizoaffective disorder, depressive type (HCC) - Continue Invega Sustenna 234 mg qmonthly, last dose received on March 16.  - Vilazodone HCl (VIIBRYD) 40 MG TABS; Take 1 tablet (40 mg total) by mouth every morning. Dispense: 90 tablet; Refill: 1 - gabapentin (NEURONTIN) 600 MG tablet; Take 1 tablet (600 mg total) by mouth 3 (three) times daily. Dispense: 270 tablet; Refill: 1 - amantadine (SYMMETREL) 100 MG capsule; Take 1 capsule (100 mg total) by mouth 2 (two) times daily. Dispense: 180 capsule; Refill: 1 - Continue Perphenazine 4 mg HS.  2.  Obsessive-compulsive disorder, unspecified type  -  Vilazodone HCl (VIIBRYD) 40 MG TABS; Take 1 tablet (40 mg total) by mouth every morning. Dispense: 90 tablet; Refill: 1 - clonazePAM (KLONOPIN) 0.5 MG tablet; Take 0.5 tablets (0.25 mg total) by mouth 2 (two) times daily as needed for anxiety. Dispense: 30 tablet; Refill: 2   Continue monthly IM Invega 234 mg injections.  Next dose will be due on April 11.   Continue same medication regimen. Patient was informed that her care is being transferred to a different provider in [facility] clinic. Patient verbalized understanding.  F/up in 2 months.   Zena Amos, MD 07/17/2020, 1:44 PM

## 2020-07-25 ENCOUNTER — Other Ambulatory Visit: Payer: Self-pay | Admitting: Family Medicine

## 2020-07-25 ENCOUNTER — Other Ambulatory Visit: Payer: Self-pay | Admitting: Nurse Practitioner

## 2020-07-25 DIAGNOSIS — E7800 Pure hypercholesterolemia, unspecified: Secondary | ICD-10-CM

## 2020-07-25 DIAGNOSIS — L719 Rosacea, unspecified: Secondary | ICD-10-CM

## 2020-07-25 DIAGNOSIS — K219 Gastro-esophageal reflux disease without esophagitis: Secondary | ICD-10-CM

## 2020-07-25 NOTE — Telephone Encounter (Signed)
Requested medication (s) are due for refill today: yes  Requested medication (s) are on the active medication list: yes  Future visit scheduled: no  Notes to clinic: medication last filled by  Danielle Rankin Review for refill   Requested Prescriptions  Pending Prescriptions Disp Refills   omeprazole (PRILOSEC) 10 MG capsule [Pharmacy Med Name: OMEPRAZOLE 10MG  CAPSULES] 90 capsule 1    Sig: TAKE 1 CAPSULE BY MOUTH DAILY.      Gastroenterology: Proton Pump Inhibitors Passed - 07/25/2020  1:45 PM      Passed - Valid encounter within last 12 months    Recent Outpatient Visits           3 months ago Encounter to establish care with new doctor   [facility], [facility], FNP   7 months ago Acute lower UTI   [facility] [place], [place]   9 months ago Type 2 diabetes mellitus with hyperglycemia, with long-term current use of insulin [facility])   [facility], [place], [place]   10 months ago Rash   [facility] [place], [place] T, NP   10 months ago Controlled type 2 diabetes mellitus without complication, with long-term current use of insulin (HCC)   [facility], [place], [place]                  atorvastatin (LIPITOR) 20 MG tablet [Pharmacy Med Name: ATORVASTATIN 20MG  TABLETS] 90 tablet 1    Sig: TAKE 1 TABLET(20 MG) BY MOUTH AT BEDTIME      Cardiovascular:  Antilipid - Statins Failed - 07/25/2020  1:45 PM      Failed - LDL in normal range and within 360 days    LDL Chol Calc (NIH)  Date Value Ref Range Status  09/07/2019 51 0 - 99 mg/dL Final          Failed - Triglycerides in normal range and within 360 days    Triglycerides  Date Value Ref Range Status  09/07/2019 202 (H) 0 - 149 mg/dL Final   Triglycerides Piccolo,Waived  Date Value Ref Range Status  04/23/2017 253 (H) <150 mg/dL Final    Comment:                            Normal                   <150                          Borderline High     150 - 199                         High                200 - 499                         Very High                >499           Passed - Total Cholesterol in normal range and within 360 days    Cholesterol, Total  Date Value Ref Range Status  09/07/2019 138 100 - 199 mg/dL Final   Cholesterol Piccolo, Waived  Date Value Ref Range Status  04/23/2017 176 <200  mg/dL Final    Comment:                            Desirable                <200                         Borderline High      200- 239                         High                     >239           Passed - HDL in normal range and within 360 days    HDL  Date Value Ref Range Status  09/07/2019 55 >39 mg/dL Final          Passed - Patient is not pregnant      Passed - Valid encounter within last 12 months    Recent Outpatient Visits           3 months ago Encounter to establish care with new doctor   [facility], Jodelle Gross, FNP   7 months ago Acute lower UTI   [facility], [place], [place]   9 months ago Type 2 diabetes mellitus with hyperglycemia, with long-term current use of insulin [facility])   [facility], [place], [place]   10 months ago Rash   [facility] [place], [place] T, NP   10 months ago Controlled type 2 diabetes mellitus without complication, with long-term current use of insulin [facility])   [facility] [place], [place], [place]

## 2020-07-25 NOTE — Telephone Encounter (Signed)
Requested medication (s) are due for refill today:   Yes/maybe depending on provider review  Requested medication (s) are on the active medication list:   Yes  Future visit scheduled:   No   Last ordered: 03/28/2020 #90, 0 refills  Clinic note:  Returned because there is not a protocol assigned to this medication.   Also the last provider she saw was [facility].   Requested Prescriptions  Pending Prescriptions Disp Refills   doxycycline (VIBRAMYCIN) 50 MG capsule [Pharmacy Med Name: DOXYCYCLINE HYC 50MG  CAPS] 90 capsule 0    Sig: TAKE 1 CAPSULE(50 MG) BY MOUTH EVERY MORNING      Off-Protocol Failed - 07/25/2020  1:43 PM      Failed - Medication not assigned to a protocol, review manually.      Passed - Valid encounter within last 12 months    Recent Outpatient Visits           3 months ago Encounter to establish care with new doctor   [facility], [facility], FNP   7 months ago Acute lower UTI   [facility] [place], [place]   9 months ago Type 2 diabetes mellitus with hyperglycemia, with long-term current use of insulin [facility])   [facility] [place], [place]   10 months ago Rash   [facility] [place], [place] T, NP   10 months ago Controlled type 2 diabetes mellitus without complication, with long-term current use of insulin [facility])   [facility] [place], [place], [place]

## 2020-07-29 DIAGNOSIS — G4733 Obstructive sleep apnea (adult) (pediatric): Secondary | ICD-10-CM | POA: Diagnosis not present

## 2020-08-05 ENCOUNTER — Ambulatory Visit: Payer: Medicare Other

## 2020-08-05 ENCOUNTER — Other Ambulatory Visit: Payer: Self-pay | Admitting: Family Medicine

## 2020-08-05 NOTE — Telephone Encounter (Signed)
Requested medication (s) are due for refill today:   Yes  Requested medication (s) are on the active medication list:   Yes  Future visit scheduled:   Yes with Nicki Reaper, NP in 2 months   Last ordered: 12/28/2019 #100 strips, 2 refills  Returned because [employer] pt.   Requested Prescriptions  Pending Prescriptions Disp Refills   ACCU-CHEK GUIDE test strip [Pharmacy Med Name: ACCU-CHEK GUIDE TEST STRIPS 100S] 100 strip 2    Sig: USE TO CHECK BLOOD SUGAR TWICE DAILY      Endocrinology: Diabetes - Testing Supplies Passed - 08/05/2020 10:25 AM      Passed - Valid encounter within last 12 months    Recent Outpatient Visits           3 months ago Encounter to establish care with new doctor   [facility], Jodelle Gross, FNP   7 months ago Acute lower UTI   [facility], [place], [place]   9 months ago Type 2 diabetes mellitus with hyperglycemia, with long-term current use of insulin [facility])   [facility] [place] [facility] [place], [place]   10 months ago Rash   [facility] [place], [place] T, NP   11 months ago Controlled type 2 diabetes mellitus without complication, with long-term current use of insulin [facility])   [facility], [place], [place]       Future Appointments             In 2 months Baity, Salvadore Oxford, NP [facility], [facility]

## 2020-08-06 ENCOUNTER — Other Ambulatory Visit: Payer: Self-pay

## 2020-08-06 ENCOUNTER — Ambulatory Visit (INDEPENDENT_AMBULATORY_CARE_PROVIDER_SITE_OTHER): Payer: Medicare Other

## 2020-08-06 DIAGNOSIS — F3170 Bipolar disorder, currently in remission, most recent episode unspecified: Secondary | ICD-10-CM | POA: Diagnosis not present

## 2020-08-06 DIAGNOSIS — F429 Obsessive-compulsive disorder, unspecified: Secondary | ICD-10-CM | POA: Diagnosis not present

## 2020-08-06 DIAGNOSIS — F251 Schizoaffective disorder, depressive type: Secondary | ICD-10-CM

## 2020-08-06 NOTE — Patient Instructions (Signed)
Patient given injection in left upper outer quadrant.  Patient reports that she is doing good.  Patient tolerated injection well.  Will return in 28 days .  invega sustenna 234mg/ 1.5ml ndc # [id number] s/n [id number],  Exp 11-25-21,  Lot #[id number] 

## 2020-08-06 NOTE — Progress Notes (Signed)
Patient given injection in left upper outer quadrant.  Patient reports that she is doing good.  Patient tolerated injection well.  Will return in 28 days .  invega sustenna 234mg / 1.40ml ndc # 4m s/n [id number],  Exp 11-25-21,  Lot #[id number]

## 2020-08-20 ENCOUNTER — Ambulatory Visit: Payer: Medicare Other

## 2020-09-03 ENCOUNTER — Ambulatory Visit (INDEPENDENT_AMBULATORY_CARE_PROVIDER_SITE_OTHER): Payer: Medicare Other

## 2020-09-03 VITALS — Ht 62.0 in | Wt 242.0 lb

## 2020-09-03 DIAGNOSIS — Z0000 Encounter for general adult medical examination without abnormal findings: Secondary | ICD-10-CM

## 2020-09-03 NOTE — Patient Instructions (Signed)
[name] , Thank you for taking time to come for your Medicare Wellness Visit. I appreciate your ongoing commitment to your health goals. Please review the following plan we discussed and let me know if I can assist you in the future.   Screening recommendations/referrals: Colonoscopy: due Mammogram: decline Bone Density: n/a Recommended yearly ophthalmology/optometry visit for glaucoma screening and checkup Recommended yearly dental visit for hygiene and checkup  Vaccinations: Influenza vaccine: due 11/25/2020 Pneumococcal vaccine: completed 04/29/2015 Tdap vaccine: completed 01/23/2015, due 01/22/2025 Shingles vaccine: n/a  Covid-19: decline  Advanced directives: Advance directive discussed with you today.   Conditions/risks identified: none  Next appointment: Follow up in one year for your annual wellness visit.   Preventive Care 40-64 Years, Female Preventive care refers to lifestyle choices and visits with your health care provider that can promote health and wellness. What does preventive care include?  A yearly physical exam. This is also called an annual well check.  Dental exams once or twice a year.  Routine eye exams. Ask your health care provider how often you should have your eyes checked.  Personal lifestyle choices, including:  Daily care of your teeth and gums.  Regular physical activity.  Eating a healthy diet.  Avoiding tobacco and drug use.  Limiting alcohol use.  Practicing safe sex.  Taking low-dose aspirin daily starting at age 56.  Taking vitamin and mineral supplements as recommended by your health care provider. What happens during an annual well check? The services and screenings done by your health care provider during your annual well check will depend on your age, overall health, lifestyle risk factors, and family history of disease. Counseling  Your health care provider may ask you questions about your:  Alcohol use.  Tobacco  use.  Drug use.  Emotional well-being.  Home and relationship well-being.  Sexual activity.  Eating habits.  Work and work Statistician.  Method of birth control.  Menstrual cycle.  Pregnancy history. Screening  You may have the following tests or measurements:  Height, weight, and BMI.  Blood pressure.  Lipid and cholesterol levels. These may be checked every 5 years, or more frequently if you are over 37 years old.  Skin check.  Lung cancer screening. You may have this screening every year starting at age 78 if you have a 30-pack-year history of smoking and currently smoke or have quit within the past 15 years.  Fecal occult blood test (FOBT) of the stool. You may have this test every year starting at age 38.  Flexible sigmoidoscopy or colonoscopy. You may have a sigmoidoscopy every 5 years or a colonoscopy every 10 years starting at age 67.  Hepatitis C blood test.  Hepatitis B blood test.  Sexually transmitted disease (STD) testing.  Diabetes screening. This is done by checking your blood sugar (glucose) after you have not eaten for a while (fasting). You may have this done every 1-3 years.  Mammogram. This may be done every 1-2 years. Talk to your health care provider about when you should start having regular mammograms. This may depend on whether you have a family history of breast cancer.  BRCA-related cancer screening. This may be done if you have a family history of breast, ovarian, tubal, or peritoneal cancers.  Pelvic exam and Pap test. This may be done every 3 years starting at age 31. Starting at age 38, this may be done every 5 years if you have a Pap test in combination with an HPV test.  Bone  density scan. This is done to screen for osteoporosis. You may have this scan if you are at high risk for osteoporosis. Discuss your test results, treatment options, and if necessary, the need for more tests with your health care provider. Vaccines  Your  health care provider may recommend certain vaccines, such as:  Influenza vaccine. This is recommended every year.  Tetanus, diphtheria, and acellular pertussis (Tdap, Td) vaccine. You may need a Td booster every 10 years.  Zoster vaccine. You may need this after age 51.  Pneumococcal 13-valent conjugate (PCV13) vaccine. You may need this if you have certain conditions and were not previously vaccinated.  Pneumococcal polysaccharide (PPSV23) vaccine. You may need one or two doses if you smoke cigarettes or if you have certain conditions. Talk to your health care provider about which screenings and vaccines you need and how often you need them. This information is not intended to replace advice given to you by your health care provider. Make sure you discuss any questions you have with your health care provider. Document Released: 05/10/2015 Document Revised: 01/01/2016 Document Reviewed: 02/12/2015 Elsevier Interactive Patient Education  2017 [place] Prevention in the Home Falls can cause injuries. They can happen to people of all ages. There are many things you can do to make your home safe and to help prevent falls. What can I do on the outside of my home?  Regularly fix the edges of walkways and driveways and fix any cracks.  Remove anything that might make you trip as you walk through a door, such as a raised step or threshold.  Trim any bushes or trees on the path to your home.  Use bright outdoor lighting.  Clear any walking paths of anything that might make someone trip, such as rocks or tools.  Regularly check to see if handrails are loose or broken. Make sure that both sides of any steps have handrails.  Any raised decks and porches should have guardrails on the edges.  Have any leaves, snow, or ice cleared regularly.  Use sand or salt on walking paths during winter.  Clean up any spills in your garage right away. This includes oil or grease  spills. What can I do in the bathroom?  Use night lights.  Install grab bars by the toilet and in the tub and shower. Do not use towel bars as grab bars.  Use non-skid mats or decals in the tub or shower.  If you need to sit down in the shower, use a plastic, non-slip stool.  Keep the floor dry. Clean up any water that spills on the floor as soon as it happens.  Remove soap buildup in the tub or shower regularly.  Attach bath mats securely with double-sided non-slip rug tape.  Do not have throw rugs and other things on the floor that can make you trip. What can I do in the bedroom?  Use night lights.  Make sure that you have a light by your bed that is easy to reach.  Do not use any sheets or blankets that are too big for your bed. They should not hang down onto the floor.  Have a firm chair that has side arms. You can use this for support while you get dressed.  Do not have throw rugs and other things on the floor that can make you trip. What can I do in the kitchen?  Clean up any spills right away.  Avoid walking on wet  floors.  Keep items that you use a lot in easy-to-reach places.  If you need to reach something above you, use a strong step stool that has a grab bar.  Keep electrical cords out of the way.  Do not use floor polish or wax that makes floors slippery. If you must use wax, use non-skid floor wax.  Do not have throw rugs and other things on the floor that can make you trip. What can I do with my stairs?  Do not leave any items on the stairs.  Make sure that there are handrails on both sides of the stairs and use them. Fix handrails that are broken or loose. Make sure that handrails are as long as the stairways.  Check any carpeting to make sure that it is firmly attached to the stairs. Fix any carpet that is loose or worn.  Avoid having throw rugs at the top or bottom of the stairs. If you do have throw rugs, attach them to the floor with carpet  tape.  Make sure that you have a light switch at the top of the stairs and the bottom of the stairs. If you do not have them, ask someone to add them for you. What else can I do to help prevent falls?  Wear shoes that:  Do not have high heels.  Have rubber bottoms.  Are comfortable and fit you well.  Are closed at the toe. Do not wear sandals.  If you use a stepladder:  Make sure that it is fully opened. Do not climb a closed stepladder.  Make sure that both sides of the stepladder are locked into place.  Ask someone to hold it for you, if possible.  Clearly mark and make sure that you can see:  Any grab bars or handrails.  First and last steps.  Where the edge of each step is.  Use tools that help you move around (mobility aids) if they are needed. These include:  Canes.  Walkers.  Scooters.  Crutches.  Turn on the lights when you go into a dark area. Replace any light bulbs as soon as they burn out.  Set up your furniture so you have a clear path. Avoid moving your furniture around.  If any of your floors are uneven, fix them.  If there are any pets around you, be aware of where they are.  Review your medicines with your doctor. Some medicines can make you feel dizzy. This can increase your chance of falling. Ask your doctor what other things that you can do to help prevent falls. This information is not intended to replace advice given to you by your health care provider. Make sure you discuss any questions you have with your health care provider. Document Released: 02/07/2009 Document Revised: 09/19/2015 Document Reviewed: 05/18/2014 Elsevier Interactive Patient Education  2017 [employer].

## 2020-09-03 NOTE — Progress Notes (Signed)
I connected with Tabitha [name] today by telephone and verified that I am speaking with the correct person using two identifiers. Location patient: home Location provider: work Persons participating in the virtual visit: Geralyn CorwinKathleen Raucci, Derran Sear LPN.   I discussed the limitations, risks, security and privacy concerns of performing an evaluation and management service by telephone and the availability of in person appointments. I also discussed with the patient that there may be a patient responsible charge related to this service. The patient expressed understanding and verbally consented to this telephonic visit.    Interactive audio and video telecommunications were attempted between this provider and patient, however failed, due to patient having technical difficulties OR patient did not have access to video capability.  We continued and completed visit with audio only.     Vital signs may be patient reported or missing.  Subjective:   Tabitha [name] is a 50 y.o. female who presents for Medicare Annual (Subsequent) preventive examination.  Review of Systems     Cardiac Risk Factors include: diabetes mellitus;hypertension;obesity (BMI >30kg/m2);sedentary lifestyle     Objective:    Today's Vitals   09/03/20 1436  Weight: 242 lb (109.8 kg)  Height: 5\' 2"  (1.575 m)   Body mass index is 44.26 kg/m.  Advanced Directives 09/03/2020 01/19/2019 01/13/2018 01/13/2017  Does Patient Have a Medical Advance Directive? No No No No  Would patient like information on creating a medical advance directive? - - Yes (MAU/Ambulatory/Procedural Areas - Information given) Yes (MAU/Ambulatory/Procedural Areas - Information given)  Some encounter information is confidential and restricted. Go to Review Flowsheets activity to see all data.    Current Medications (verified) Outpatient Encounter Medications as of 09/03/2020  Medication Sig  . ACCU-CHEK GUIDE test strip USE TO CHECK BLOOD SUGAR  TWICE DAILY  . albuterol (VENTOLIN HFA) 108 (90 Base) MCG/ACT inhaler INHALE 2 PUFFS INTO THE LUNGS EVERY 6 HOURS AS NEEDED FOR WHEEZING OR SHORTNESS OF BREATH  . amantadine (SYMMETREL) 100 MG capsule Take 1 capsule (100 mg total) by mouth 2 (two) times daily.  Marland Kitchen. atorvastatin (LIPITOR) 20 MG tablet TAKE 1 TABLET(20 MG) BY MOUTH AT BEDTIME  . clonazePAM (KLONOPIN) 0.5 MG tablet Take 0.5 tablets (0.25 mg total) by mouth 2 (two) times daily as needed for anxiety.  Marland Kitchen. doxycycline (VIBRAMYCIN) 50 MG capsule TAKE 1 CAPSULE(50 MG) BY MOUTH EVERY MORNING  . fluticasone (FLOVENT HFA) 110 MCG/ACT inhaler Inhale 1 puff into the lungs 2 (two) times daily.  Marland Kitchen. gabapentin (NEURONTIN) 600 MG tablet Take 1 tablet (600 mg total) by mouth 3 (three) times daily.  . hydrochlorothiazide (HYDRODIURIL) 12.5 MG tablet TAKE 1 TABLET(12.5 MG) BY MOUTH DAILY  . insulin degludec (TRESIBA FLEXTOUCH) 100 UNIT/ML FlexTouch Pen INJECT 0.4 MLS(40 UNITS TOTAL) UNDER THE SKIN DAILY  . Insulin Pen Needle (BD PEN NEEDLE NANO U/F) 32G X 4 MM MISC 1 each by Does not apply route daily.  Marland Kitchen. MELATONIN PO Take 30 mg by mouth. Taking 1 tablet daily at night  . meloxicam (MOBIC) 15 MG tablet TAKE 1 TABLET(15 MG) BY MOUTH DAILY  . metFORMIN (GLUCOPHAGE) 500 MG tablet TAKE 2 TABLETS(1000 MG) BY MOUTH TWICE DAILY WITH A MEAL  . NOVOTWIST 32G X 5 MM MISC USE WITH LEVEMIR PEN BID  . omeprazole (PRILOSEC) 10 MG capsule TAKE 1 CAPSULE BY MOUTH DAILY.  Marland Kitchen. perphenazine (TRILAFON) 4 MG tablet Take 1 tablet (4 mg total) by mouth at bedtime.  . phenazopyridine (PYRIDIUM) 200 MG tablet Take 1 tablet (  200 mg total) by mouth 3 (three) times daily as needed for pain.  . pioglitazone (ACTOS) 45 MG tablet TAKE 1 TABLET(45 MG) BY MOUTH DAILY  . Semaglutide,0.25 or 0.5MG /DOS, (OZEMPIC, 0.25 OR 0.5 MG/DOSE,) 2 MG/1.5ML SOPN Inject 0.375 mLs (0.5 mg total) into the skin once a week.  . Vilazodone HCl (VIIBRYD) 40 MG TABS Take 1 tablet (40 mg total) by mouth daily.   . paliperidone (INVEGA SUSTENNA) 234 MG/1.5ML SUSY injection Inject 234 mg into the muscle once for 1 dose.   Facility-Administered Encounter Medications as of 09/03/2020  Medication  . paliperidone (INVEGA SUSTENNA) injection 234 mg    Allergies (verified) Abilify [aripiprazole]   History: Past Medical History:  Diagnosis Date  . Allergic rhinitis   . Anxiety   . Asthma   . Bipolar disorder (HCC)   . Depression   . Diabetes mellitus without complication (HCC)   . GERD (gastroesophageal reflux disease)   . Hyperlipidemia   . Hypertension   . Mild sleep apnea   . OCD (obsessive compulsive disorder)   . Palpitations   . Paranoia (HCC)   . Schizoaffective disorder (HCC)   . Sleep apnea    Past Surgical History:  Procedure Laterality Date  . CESAREAN SECTION  2006/2008  . CHOLECYSTECTOMY  2006   Family History  Problem Relation Age of Onset  . Diabetes Father   . Hyperlipidemia Father   . Diabetes Brother   . Lupus Mother   . Diabetes Mother        pre diabetes   Social History   Socioeconomic History  . Marital status: Married    Spouse name: kevin  . Number of children: 2  . Years of education: Not on file  . Highest education level: Bachelor's degree (e.g., BA, AB, BS)  Occupational History  . Occupation: disability  Tobacco Use  . Smoking status: Never Smoker  . Smokeless tobacco: Never Used  Vaping Use  . Vaping Use: Never used  Substance and Sexual Activity  . Alcohol use: No    Alcohol/week: 0.0 standard drinks  . Drug use: No  . Sexual activity: Yes    Partners: Male    Birth control/protection: None, Surgical  Other Topics Concern  . Not on file  Social History Narrative  . Not on file   Social Determinants of Health   Financial Resource Strain: Low Risk   . Difficulty of Paying Living Expenses: Not hard at all  Food Insecurity: No Food Insecurity  . Worried About Programme researcher, broadcasting/film/video in the Last Year: Never true  . Ran Out of Food in  the Last Year: Never true  Transportation Needs: No Transportation Needs  . Lack of Transportation (Medical): No  . Lack of Transportation (Non-Medical): No  Physical Activity: Inactive  . Days of Exercise per Week: 0 days  . Minutes of Exercise per Session: 0 min  Stress: Stress Concern Present  . Feeling of Stress : To some extent  Social Connections: Not on file    Tobacco Counseling Counseling given: Not Answered   Clinical Intake:  Pre-visit preparation completed: Yes  Pain : No/denies pain     Nutritional Status: BMI > 30  Obese Nutritional Risks: Nausea/ vomitting/ diarrhea (regular diarrhea) Diabetes: Yes  How often do you need to have someone help you when you read instructions, pamphlets, or other written materials from your doctor or pharmacy?: 1 - Never What is the last grade level you completed in school?: college  Diabetic? Yes Nutrition Risk Assessment:  Has the patient had any N/V/D within the last 2 months?  Yes  Does the patient have any non-healing wounds?  No  Has the patient had any unintentional weight loss or weight gain?  No   Diabetes:  Is the patient diabetic?  Yes  If diabetic, was a CBG obtained today?  No  Did the patient bring in their glucometer from home?  No  How often do you monitor your CBG's? Twice daily.   Financial Strains and Diabetes Management:  Are you having any financial strains with the device, your supplies or your medication? No .  Does the patient want to be seen by Chronic Care Management for management of their diabetes?  No  Would the patient like to be referred to a Nutritionist or for Diabetic Management?  No   Diabetic Exams:  Diabetic Eye Exam: Overdue for diabetic eye exam. Pt has been advised about the importance in completing this exam. Patient advised to call and schedule an eye exam. Diabetic Foot Exam: Overdue, Pt has been advised about the importance in completing this exam. Pt is scheduled for  diabetic foot exam on next appointment.   Interpreter Needed?: No  Information entered by :: NAllen LPN   Activities of Daily Living In your present state of health, do you have any difficulty performing the following activities: 09/03/2020  Hearing? Y  Vision? Y  Comment trouble close up sometimes  Difficulty concentrating or making decisions? Y  Walking or climbing stairs? Y  Dressing or bathing? N  Doing errands, shopping? N  Preparing Food and eating ? N  Using the Toilet? N  In the past six months, have you accidently leaked urine? Y  Do you have problems with loss of bowel control? N  Managing your Medications? Y  Comment someone sits with her while she sets up  Managing your Finances? N  Housekeeping or managing your Housekeeping? N  Some recent data might be hidden    Patient Care Team: Malfi, Jodelle Gross, FNP as PCP - General (Family Medicine) Brandy Hale, MD as Referring Physician (Psychiatry) Debbe Odea, MD as Consulting Physician (Cardiology)  Indicate any recent Medical Services you may have received from other than [facility] providers in the past year (date may be approximate).     Assessment:   This is a routine wellness examination for Hoang.  Hearing/Vision screen  Hearing Screening   125Hz  250Hz  500Hz  1000Hz  2000Hz  3000Hz  4000Hz  6000Hz  8000Hz   Right ear:           Left ear:           Vision Screening Comments: No regular eye exams  Dietary issues and exercise activities discussed: Current Exercise Habits: The patient does not participate in regular exercise at present  Goals Addressed            This Visit's Progress   . Patient Stated       09/03/2020, wants to weigh under 200 pounds      Depression Screen PHQ 2/9 Scores 09/03/2020 01/19/2019 04/28/2018 01/13/2018 01/13/2017 09/24/2015 06/20/2015  PHQ - 2 Score 2 0 4 2 2 3  0  PHQ- 9 Score 15 - 11 8 12 8  -  Some encounter information is confidential and restricted. Go to Review Flowsheets  activity to see all data.    Fall Risk Fall Risk  09/03/2020 01/19/2019 04/30/2018 04/28/2018 01/13/2018  Falls in the past year? 0 1 0 0 No  Number falls in  past yr: - 1 - - -  Injury with Fall? - 0 - - -  Risk for fall due to : Medication side effect - - - -  Follow up Falls evaluation completed;Education provided;Falls prevention discussed - Falls evaluation completed;Falls prevention discussed Falls evaluation completed -    FALL RISK PREVENTION PERTAINING TO THE HOME:  Any stairs in or around the home? Yes  If so, are there any without handrails? No  Home free of loose throw rugs in walkways, pet beds, electrical cords, etc? Yes  Adequate lighting in your home to reduce risk of falls? Yes   ASSISTIVE DEVICES UTILIZED TO PREVENT FALLS:  Life alert? No  Use of a cane, walker or w/c? No  Grab bars in the bathroom? Yes  Shower chair or bench in shower? Yes  Elevated toilet seat or a handicapped toilet? No   TIMED UP AND GO:  Was the test performed? No .    Cognitive Function: MMSE - Mini Mental State Exam 09/11/2019  Orientation to time 1  Orientation to Place 5  Registration 1  Attention/ Calculation 0  Recall 1  Language- name 2 objects 2  Language- repeat 1  Language- follow 3 step command 3  Language- read & follow direction 1  Write a sentence 1  Copy design 0  Total score 16     6CIT Screen 09/03/2020 01/13/2018 01/13/2017  What Year? 0 points 0 points 0 points  What month? 0 points 0 points 0 points  What time? 0 points 0 points 0 points  Count back from 20 2 points 0 points 0 points  Months in reverse 4 points 0 points 0 points  Repeat phrase 10 points 0 points 4 points  Total Score 16 0 4    Immunizations Immunization History  Administered Date(s) Administered  . Influenza,inj,Quad PF,6+ Mos 01/16/2016, 01/13/2017, 01/13/2018  . Influenza-Unspecified 02/07/2015  . Pneumococcal Polysaccharide-23 04/29/2015  . Tdap 01/23/2015    TDAP status: Up to  date  Flu Vaccine status: Declined, Education has been provided regarding the importance of this vaccine but patient still declined. Advised may receive this vaccine at local pharmacy or Health Dept. Aware to provide a copy of the vaccination record if obtained from local pharmacy or Health Dept. Verbalized acceptance and understanding.  Pneumococcal vaccine status: Up to date  Covid-19 vaccine status: Declined, Education has been provided regarding the importance of this vaccine but patient still declined. Advised may receive this vaccine at local pharmacy or Health Dept.or vaccine clinic. Aware to provide a copy of the vaccination record if obtained from local pharmacy or Health Dept. Verbalized acceptance and understanding.  Qualifies for Shingles Vaccine? No   Zostavax completed n/a  Shingrix Completed?: n/a  Screening Tests Health Maintenance  Topic Date Due  . COVID-19 Vaccine (1) Never done  . MAMMOGRAM  Never done  . Hepatitis C Screening  Never done  . COLONOSCOPY (Pts 45-58yrs Insurance coverage will need to be confirmed)  Never done  . OPHTHALMOLOGY EXAM  07/30/2018  . FOOT EXAM  04/29/2019  . PAP SMEAR-Modifier  01/25/2020  . HEMOGLOBIN A1C  03/09/2020  . INFLUENZA VACCINE  11/25/2020  . TETANUS/TDAP  01/25/2025  . PNEUMOCOCCAL POLYSACCHARIDE VACCINE AGE 32-64 HIGH RISK  Completed  . HPV VACCINES  Aged Out  . HIV Screening  Discontinued    Health Maintenance  Health Maintenance Due  Topic Date Due  . COVID-19 Vaccine (1) Never done  . MAMMOGRAM  Never done  .  Hepatitis C Screening  Never done  . COLONOSCOPY (Pts 45-1yrs Insurance coverage will need to be confirmed)  Never done  . OPHTHALMOLOGY EXAM  07/30/2018  . FOOT EXAM  04/29/2019  . PAP SMEAR-Modifier  01/25/2020  . HEMOGLOBIN A1C  03/09/2020    Colorectal cancer screening: due  Mammogram status: decline  Bone Density status: n/a  Lung Cancer Screening: (Low Dose CT Chest recommended if Age 70-80  years, 30 pack-year currently smoking OR have quit w/in 15years.) does not qualify.   Lung Cancer Screening Referral: no  Additional Screening:  Hepatitis C Screening: does qualify; due  Vision Screening: Recommended annual ophthalmology exams for early detection of glaucoma and other disorders of the eye. Is the patient up to date with their annual eye exam?  No  Who is the provider or what is the name of the office in which the patient attends annual eye exams? none If pt is not established with a provider, would they like to be referred to a provider to establish care? No .   Dental Screening: Recommended annual dental exams for proper oral hygiene  Community Resource Referral / Chronic Care Management: CRR required this visit?  No   CCM required this visit?  No      Plan:     I have personally reviewed and noted the following in the patient's chart:   . Medical and social history . Use of alcohol, tobacco or illicit drugs  . Current medications and supplements including opioid prescriptions.  . Functional ability and status . Nutritional status . Physical activity . Advanced directives . List of other physicians . Hospitalizations, surgeries, and ER visits in previous 12 months . Vitals . Screenings to include cognitive, depression, and falls . Referrals and appointments  In addition, I have reviewed and discussed with patient certain preventive protocols, quality metrics, and best practice recommendations. A written personalized care plan for preventive services as well as general preventive health recommendations were provided to patient.     Barb Merino, LPN   1/61/[id number]   Nurse Notes:

## 2020-09-05 ENCOUNTER — Other Ambulatory Visit: Payer: Self-pay

## 2020-09-05 ENCOUNTER — Ambulatory Visit (INDEPENDENT_AMBULATORY_CARE_PROVIDER_SITE_OTHER): Payer: Medicare Other

## 2020-09-05 DIAGNOSIS — F251 Schizoaffective disorder, depressive type: Secondary | ICD-10-CM

## 2020-09-05 DIAGNOSIS — F429 Obsessive-compulsive disorder, unspecified: Secondary | ICD-10-CM | POA: Diagnosis not present

## 2020-09-05 DIAGNOSIS — F3170 Bipolar disorder, currently in remission, most recent episode unspecified: Secondary | ICD-10-CM | POA: Diagnosis not present

## 2020-09-05 NOTE — Patient Instructions (Signed)
Patient given injection in left upper outer quadrant.  Patient reports that she is doing good.  Patient tolerated injection well.  Will return in 28 days .  invega sustenna 234mg / 1.6ml ndc # 4m s/n [id number],  Exp 11-25-21,  Lot #[id number]

## 2020-09-12 NOTE — Progress Notes (Deleted)
Psychiatric Initial Adult Assessment   Patient Identification: Tabitha [name] MRN:  [id number] Date of Evaluation:  09/12/2020 Referral Source: *** Chief Complaint:   Visit Diagnosis: No diagnosis found.  History of Present Illness:   Tabitha [name] is a 50 y.o. year old female with a history of schizoaffective disorder,  sleep apnea, diabetes, hypertension, hyperlipidemia, GERD, who presents for follow up appointment. She is transferred from Dr. Evelene Croon, who is leaving the practice.          Associated Signs/Symptoms: Depression Symptoms:  {DEPRESSION SYMPTOMS:20000} (Hypo) Manic Symptoms:  {BHH MANIC SYMPTOMS:22872} Anxiety Symptoms:  {BHH ANXIETY SYMPTOMS:22873} Psychotic Symptoms:  {BHH PSYCHOTIC SYMPTOMS:22874} PTSD Symptoms: {BHH PTSD SYMPTOMS:22875}  Past Psychiatric History:  Outpatient: used to be seen by Dr. Tomasa Rand at [facility], followed by Dr. Garnetta Buddy, and Dr. Evelene Croon Psychiatry admission:  Previous suicide attempt:  Past trials of medication:  History of violence:   Previous Psychotropic Medications: {YES/NO:21197}  Substance Abuse History in the last 12 months:  {yes no:314532}  Consequences of Substance Abuse: {BHH CONSEQUENCES OF SUBSTANCE ABUSE:22880}  Past Medical History:  Past Medical History:  Diagnosis Date  . Allergic rhinitis   . Anxiety   . Asthma   . Bipolar disorder (HCC)   . Depression   . Diabetes mellitus without complication (HCC)   . GERD (gastroesophageal reflux disease)   . Hyperlipidemia   . Hypertension   . Mild sleep apnea   . OCD (obsessive compulsive disorder)   . Palpitations   . Paranoia (HCC)   . Schizoaffective disorder (HCC)   . Sleep apnea     Past Surgical History:  Procedure Laterality Date  . CESAREAN SECTION  2006/2008  . CHOLECYSTECTOMY  2006    Family Psychiatric History: ***  Family History:  Family History  Problem Relation Age of Onset  . Diabetes Father   .  Hyperlipidemia Father   . Diabetes Brother   . Lupus Mother   . Diabetes Mother        pre diabetes    Social History:   Social History   Socioeconomic History  . Marital status: Married    Spouse name: kevin  . Number of children: 2  . Years of education: Not on file  . Highest education level: Bachelor's degree (e.g., BA, AB, BS)  Occupational History  . Occupation: disability  Tobacco Use  . Smoking status: Never Smoker  . Smokeless tobacco: Never Used  Vaping Use  . Vaping Use: Never used  Substance and Sexual Activity  . Alcohol use: No    Alcohol/week: 0.0 standard drinks  . Drug use: No  . Sexual activity: Yes    Partners: Male    Birth control/protection: None, Surgical  Other Topics Concern  . Not on file  Social History Narrative  . Not on file   Social Determinants of Health   Financial Resource Strain: Low Risk   . Difficulty of Paying Living Expenses: Not hard at all  Food Insecurity: No Food Insecurity  . Worried About Programme researcher, broadcasting/film/video in the Last Year: Never true  . Ran Out of Food in the Last Year: Never true  Transportation Needs: No Transportation Needs  . Lack of Transportation (Medical): No  . Lack of Transportation (Non-Medical): No  Physical Activity: Inactive  . Days of Exercise per Week: 0 days  . Minutes of Exercise per Session: 0 min  Stress: Stress Concern Present  . Feeling of Stress : To some extent  Social Connections: Not on file    Additional Social History: ***  Allergies:   Allergies  Allergen Reactions  . Abilify [Aripiprazole] Hives    Metabolic Disorder Labs: Lab Results  Component Value Date   HGBA1C 6.6 (H) 09/07/2019   Lab Results  Component Value Date   PROLACTIN 84.8 (H) 06/01/2017   PROLACTIN 36.2 (H) 03/26/2016   Lab Results  Component Value Date   CHOL 138 09/07/2019   TRIG 202 (H) 09/07/2019   HDL 55 09/07/2019   CHOLHDL 2.9 09/26/2015   VLDL 51 (H) 04/23/2017   LDLCALC 51 09/07/2019    LDLCALC 63 04/28/2018   Lab Results  Component Value Date   TSH 1.290 04/28/2018    Therapeutic Level Labs: No results found for: LITHIUM No results found for: CBMZ Lab Results  Component Value Date   VALPROATE 52 10/22/2017    Current Medications: Current Outpatient Medications  Medication Sig Dispense Refill  . ACCU-CHEK GUIDE test strip USE TO CHECK BLOOD SUGAR TWICE DAILY 100 strip 2  . albuterol (VENTOLIN HFA) 108 (90 Base) MCG/ACT inhaler INHALE 2 PUFFS INTO THE LUNGS EVERY 6 HOURS AS NEEDED FOR WHEEZING OR SHORTNESS OF BREATH 25.5 g 3  . amantadine (SYMMETREL) 100 MG capsule Take 1 capsule (100 mg total) by mouth 2 (two) times daily. 180 capsule 0  . atorvastatin (LIPITOR) 20 MG tablet TAKE 1 TABLET(20 MG) BY MOUTH AT BEDTIME 90 tablet 0  . clonazePAM (KLONOPIN) 0.5 MG tablet Take 0.5 tablets (0.25 mg total) by mouth 2 (two) times daily as needed for anxiety. 30 tablet 1  . doxycycline (VIBRAMYCIN) 50 MG capsule TAKE 1 CAPSULE(50 MG) BY MOUTH EVERY MORNING 90 capsule 0  . fluticasone (FLOVENT HFA) 110 MCG/ACT inhaler Inhale 1 puff into the lungs 2 (two) times daily. 1 Inhaler 11  . gabapentin (NEURONTIN) 600 MG tablet Take 1 tablet (600 mg total) by mouth 3 (three) times daily. 270 tablet 0  . hydrochlorothiazide (HYDRODIURIL) 12.5 MG tablet TAKE 1 TABLET(12.5 MG) BY MOUTH DAILY 90 tablet 1  . insulin degludec (TRESIBA FLEXTOUCH) 100 UNIT/ML FlexTouch Pen INJECT 0.4 MLS(40 UNITS TOTAL) UNDER THE SKIN DAILY 15 mL 5  . Insulin Pen Needle (BD PEN NEEDLE NANO U/F) 32G X 4 MM MISC 1 each by Does not apply route daily. 100 each 12  . MELATONIN PO Take 30 mg by mouth. Taking 1 tablet daily at night    . meloxicam (MOBIC) 15 MG tablet TAKE 1 TABLET(15 MG) BY MOUTH DAILY 30 tablet 0  . metFORMIN (GLUCOPHAGE) 500 MG tablet TAKE 2 TABLETS(1000 MG) BY MOUTH TWICE DAILY WITH A MEAL 360 tablet 0  . NOVOTWIST 32G X 5 MM MISC USE WITH LEVEMIR PEN BID  5  . omeprazole (PRILOSEC) 10 MG capsule  TAKE 1 CAPSULE BY MOUTH DAILY. 90 capsule 0  . paliperidone (INVEGA SUSTENNA) 234 MG/1.5ML SUSY injection Inject 234 mg into the muscle once for 1 dose. 1.5 mL 11  . perphenazine (TRILAFON) 4 MG tablet Take 1 tablet (4 mg total) by mouth at bedtime. 90 tablet 0  . phenazopyridine (PYRIDIUM) 200 MG tablet Take 1 tablet (200 mg total) by mouth 3 (three) times daily as needed for pain. 10 tablet 0  . pioglitazone (ACTOS) 45 MG tablet TAKE 1 TABLET(45 MG) BY MOUTH DAILY 90 tablet 1  . Semaglutide,0.25 or 0.5MG /DOS, (OZEMPIC, 0.25 OR 0.5 MG/DOSE,) 2 MG/1.5ML SOPN Inject 0.375 mLs (0.5 mg total) into the skin once a week. 1 pen 5  .  Vilazodone HCl (VIIBRYD) 40 MG TABS Take 1 tablet (40 mg total) by mouth daily. 90 tablet 0   Current Facility-Administered Medications  Medication Dose Route Frequency Provider Last Rate Last Admin  . paliperidone (INVEGA SUSTENNA) injection 234 mg  234 mg Intramuscular Q30 days Zena Amos, MD   234 mg at 09/05/20 1105    Musculoskeletal: Strength & Muscle Tone: {desc; muscle tone:32375} Gait & Station: {PE GAIT ED FTDD:22025} Patient leans: {Patient Leans:21022755}  Psychiatric Specialty Exam: Review of Systems  There were no vitals taken for this visit.There is no height or weight on file to calculate BMI.  General Appearance: {Appearance:22683}  Eye Contact:  {BHH EYE CONTACT:22684}  Speech:  {Speech:22685}  Volume:  {Volume (PAA):22686}  Mood:  {BHH MOOD:22306}  Affect:  {Affect (PAA):22687}  Thought Process:  {Thought Process (PAA):22688}  Orientation:  {BHH ORIENTATION (PAA):22689}  Thought Content:  {Thought Content:22690}  Suicidal Thoughts:  {ST/HT (PAA):22692}  Homicidal Thoughts:  {ST/HT (PAA):22692}  Memory:  {BHH MEMORY:22881}  Judgement:  {Judgement (PAA):22694}  Insight:  {Insight (PAA):22695}  Psychomotor Activity:  {Psychomotor (PAA):22696}  Concentration:  {Concentration:21399}  Recall:  {BHH GOOD/FAIR/POOR:22877}  Fund of  Knowledge:{BHH GOOD/FAIR/POOR:22877}  Language: {BHH GOOD/FAIR/POOR:22877}  Akathisia:  {BHH YES OR NO:22294}  Handed:  {Handed:22697}  AIMS (if indicated):  {Desc; done/not:10129}  Assets:  {Assets (PAA):22698}  ADL's:  {BHH KYH'C:62376}  Cognition: {chl bhh cognition:304700322}  Sleep:  {BHH GOOD/FAIR/POOR:22877}   Screenings: AIMS   Flowsheet Row Office Visit from 05/12/2017 in [facility] Office Visit from 07/29/2016 in [facility] Office Visit from 05/06/2016 in [facility] Office Visit from 04/01/2016 in [facility] Office Visit from 03/28/2015 in [facility]  AIMS Total Score 0 0 0 0 0    GAD-7   Flowsheet Row Office Visit from 04/28/2018 in [facility]  Total GAD-7 Score 8    Mini-Mental   Flowsheet Row Office Visit from 09/07/2019 in [facility]  Total Score (max 30 points ) 16    PHQ2-9   Flowsheet Row Clinical Support from 09/03/2020 in [facility] Clinical Support from 01/19/2019 in [facility] Office Visit from 04/28/2018 in [facility] Clinical Support from 01/13/2018 in [facility] Clinical Support from 01/13/2017 in [facility]  PHQ-2 Total Score 2 0 4 2 2   PHQ-9 Total Score 15 -- 11 8 12       Assessment and Plan:    Plan 1. Continue monthly  invega 234 mg IM 2. Continue perphenazine 4 mg qhs 3. Continue viibryd 40 mg 4. Continue amantadine 100  Mg bid 5. Continue gabapentin 600 mg tid 6. Continue clonazepam 0.25 mg bid       The patient demonstrates the following risk factors for suicide: Chronic risk factors for suicide include: {Chronic Risk Factors for . Acute risk factors for suicide include: {Acute Risk Factors for . Protective factors for this patient include: {Protective Factors for Suicide  EGBTDVV:61607371}. Considering these factors, the overall suicide risk at this point appears to be {Desc; low/moderate/high:110033}. Patient {ACTION; IS/IS GGYIRSW:54627035} appropriate for outpatient follow up.      [id number]}, MD 5/19/20224:39 PM

## 2020-09-17 ENCOUNTER — Encounter: Payer: Self-pay | Admitting: Psychiatry

## 2020-09-17 ENCOUNTER — Telehealth (INDEPENDENT_AMBULATORY_CARE_PROVIDER_SITE_OTHER): Payer: Medicare Other | Admitting: Psychiatry

## 2020-09-17 ENCOUNTER — Other Ambulatory Visit: Payer: Self-pay

## 2020-09-17 DIAGNOSIS — F251 Schizoaffective disorder, depressive type: Secondary | ICD-10-CM

## 2020-09-17 DIAGNOSIS — F429 Obsessive-compulsive disorder, unspecified: Secondary | ICD-10-CM | POA: Diagnosis not present

## 2020-09-17 MED ORDER — VIIBRYD 40 MG PO TABS
40.0000 mg | ORAL_TABLET | Freq: Every day | ORAL | 1 refills | Status: DC
Start: 2020-10-17 — End: 2021-04-07

## 2020-09-17 MED ORDER — GABAPENTIN 600 MG PO TABS: 600.0000 mg | ORAL_TABLET | Freq: Three times a day (TID) | ORAL | 1 refills | Status: DC

## 2020-09-17 MED ORDER — AMANTADINE HCL 100 MG PO CAPS: 100.0000 mg | ORAL_CAPSULE | Freq: Two times a day (BID) | ORAL | 0 refills | Status: DC

## 2020-09-17 MED ORDER — PERPHENAZINE 8 MG PO TABS
8.0000 mg | ORAL_TABLET | Freq: Every day | ORAL | 1 refills | Status: DC
Start: 2020-09-17 — End: 2020-09-25

## 2020-09-17 NOTE — Progress Notes (Signed)
Virtual Visit via Video Note  I connected with Tabitha [name] on 09/17/20 at  2:00 PM EDT by a video enabled telemedicine application and verified that I am speaking with the correct person using two identifiers.  Location: Patient: home Provider: office Persons participated in the visit- patient, provider   I discussed the limitations of evaluation and management by telemedicine and the availability of in person appointments. The patient expressed understanding and agreed to proceed.   I discussed the assessment and treatment plan with the patient. The patient was provided an opportunity to ask questions and all were answered. The patient agreed with the plan and demonstrated an understanding of the instructions.   The patient was advised to call back or seek an in-person evaluation if the symptoms worsen or if the condition fails to improve as anticipated.  I provided 35 minutes of non-face-to-face time during this encounter.   [name], MD    [facility] MD/PA/NP OP Progress Note  09/17/2020 2:56 PM UNDRA TREMBATH  MRN:  [id number]  Chief Complaint:  Chief Complaint    Follow-up; Schizophrenia     HPI:  Tabitha [name] is a 50 y.o. year old female with a history of schizoaffective disorder,  sleep apnea, diabetes, hypertension, hyperlipidemia, GERD, who is transferred care from Dr. Evelene Croon.   She states that she is not doing well.  She is concerned about her husband, who was charged on something which she declined to disclose as she was told by not to by the police.  Although they hired an Pensions consultant and she believes he did not do anything wrong, he was charged on something.  She reports good relationship was her husband and her 2 sons, who gets involved in extracurricular activity. She has paranoia that people at church are trying to get her.  She occasionally feels uncomfortable going there due to this issue.  She also has paranoia that her sister-in-law is trying to break her and  her husband apart, although she knows deep down that she would not do it.  She has age of people calling her.  She denies VH.  She had SI of cutting her with a knife as she could not do anything right.  She states that suicide is against her religion of Christianity, and she denies any intent.  She feels good support from her husband and her friends when she feels stressed.  She has been trying to take perphenazine 4 mg twice a day with the thought that it was directed by Dr. Evelene Croon.  However, she has been missing to take it twice a day on several days.  She agrees to try higher dose once a day.  She feels anxious.  She has occasional insomnia.  She reports gaining weight, which she attributes to eating more due to anxiety.  She denies HI.  She reports impulsive shopping of $100 the other time.  She denies alcohol use or drug use.  She denies any tremors.  She is unsure why she has been on amantadine.  She has not been taking clonazepam as she was concerned of driving under the influence of this medication.   Trauma- brother was "curious" when she was a child. She feels that he does not like her as much  Daily routine:goes to church weekly. Choir on Wed,  Exercise: Employment: unemployed, on disability due to paranoia (felt people coming to her workplace). used to work at [employer] until 2007 (quit due to pregnancy) Support: husband, son, parents Household:  husband, 2 sons (age 53,15) Marital status: married Number of children: 2 sons Education: college, majored in Psychologist, educational, music,   245 lbs (used to be 230 a few months ago) [employer] Readings from Last 3 Encounters:  09/05/20 243 lb 12.8 oz (110.6 kg)  09/03/20 242 lb (109.8 kg)  08/06/20 245 lb 3.2 oz (111.2 kg)    Visit Diagnosis:    ICD-10-CM   1. Schizoaffective disorder, depressive type (HCC)  F25.1 Vilazodone HCl (VIIBRYD) 40 MG TABS    gabapentin (NEURONTIN) 600 MG tablet    amantadine (SYMMETREL) 100 MG capsule  2. Obsessive-compulsive disorder,  unspecified type  F42.9 Vilazodone HCl (VIIBRYD) 40 MG TABS    Past Psychiatric History:  Outpatient: used to be seen by a psychiatrist in [facility], Dr. Tomasa Rand at [facility], followed by Dr. Garnetta Buddy, and Dr. Evelene Croon people are about to get her Psychiatry admission: denies  Previous suicide attempt: denies  Past trials of medication: Abilify (hives), olanzapine, quetiapine, risperidone (stopped working),  History of violence: denies   Past Medical History:  Past Medical History:  Diagnosis Date  . Allergic rhinitis   . Anxiety   . Asthma   . Bipolar disorder (HCC)   . Depression   . Diabetes mellitus without complication (HCC)   . GERD (gastroesophageal reflux disease)   . Hyperlipidemia   . Hypertension   . Mild sleep apnea   . OCD (obsessive compulsive disorder)   . Palpitations   . Paranoia (HCC)   . Schizoaffective disorder (HCC)   . Sleep apnea     Past Surgical History:  Procedure Laterality Date  . CESAREAN SECTION  2006/2008  . CHOLECYSTECTOMY  2006    Family Psychiatric History: denies (undiagnosed)  Family History:  Family History  Problem Relation Age of Onset  . Diabetes Father   . Hyperlipidemia Father   . Diabetes Brother   . Lupus Mother   . Diabetes Mother        pre diabetes    Social History:  Social History   Socioeconomic History  . Marital status: Married    Spouse name: kevin  . Number of children: 2  . Years of education: Not on file  . Highest education level: Bachelor's degree (e.g., BA, AB, BS)  Occupational History  . Occupation: disability  Tobacco Use  . Smoking status: Never Smoker  . Smokeless tobacco: Never Used  Vaping Use  . Vaping Use: Never used  Substance and Sexual Activity  . Alcohol use: No    Alcohol/week: 0.0 standard drinks  . Drug use: No  . Sexual activity: Yes    Partners: Male    Birth control/protection: None, Surgical  Other Topics Concern  . Not on file  Social History Narrative   . Not on file   Social Determinants of Health   Financial Resource Strain: Low Risk   . Difficulty of Paying Living Expenses: Not hard at all  Food Insecurity: No Food Insecurity  . Worried About Programme researcher, broadcasting/film/video in the Last Year: Never true  . Ran Out of Food in the Last Year: Never true  Transportation Needs: No Transportation Needs  . Lack of Transportation (Medical): No  . Lack of Transportation (Non-Medical): No  Physical Activity: Inactive  . Days of Exercise per Week: 0 days  . Minutes of Exercise per Session: 0 min  Stress: Stress Concern Present  . Feeling of Stress : To some extent  Social Connections: Not on file  Allergies:  Allergies  Allergen Reactions  . Abilify [Aripiprazole] Hives    Metabolic Disorder Labs: Lab Results  Component Value Date   HGBA1C 6.6 (H) 09/07/2019   Lab Results  Component Value Date   PROLACTIN 84.8 (H) 06/01/2017   PROLACTIN 36.2 (H) 03/26/2016   Lab Results  Component Value Date   CHOL 138 09/07/2019   TRIG 202 (H) 09/07/2019   HDL 55 09/07/2019   CHOLHDL 2.9 09/26/2015   VLDL 51 (H) 04/23/2017   LDLCALC 51 09/07/2019   LDLCALC 63 04/28/2018   Lab Results  Component Value Date   TSH 1.290 04/28/2018   TSH 2.570 06/01/2017    Therapeutic Level Labs: No results found for: LITHIUM Lab Results  Component Value Date   VALPROATE 52 10/22/2017   VALPROATE 43 (L) 06/01/2017   No components found for:  CBMZ  Current Medications: Current Outpatient Medications  Medication Sig Dispense Refill  . perphenazine (TRILAFON) 8 MG tablet Take 1 tablet (8 mg total) by mouth daily. 30 tablet 1  . ACCU-CHEK GUIDE test strip USE TO CHECK BLOOD SUGAR TWICE DAILY 100 strip 2  . albuterol (VENTOLIN HFA) 108 (90 Base) MCG/ACT inhaler INHALE 2 PUFFS INTO THE LUNGS EVERY 6 HOURS AS NEEDED FOR WHEEZING OR SHORTNESS OF BREATH 25.5 g 3  . amantadine (SYMMETREL) 100 MG capsule Take 1 capsule (100 mg total) by mouth 2 (two) times  daily. 180 capsule 0  . atorvastatin (LIPITOR) 20 MG tablet TAKE 1 TABLET(20 MG) BY MOUTH AT BEDTIME 90 tablet 0  . doxycycline (VIBRAMYCIN) 50 MG capsule TAKE 1 CAPSULE(50 MG) BY MOUTH EVERY MORNING 90 capsule 0  . fluticasone (FLOVENT HFA) 110 MCG/ACT inhaler Inhale 1 puff into the lungs 2 (two) times daily. 1 Inhaler 11  . [START ON 10/17/2020] gabapentin (NEURONTIN) 600 MG tablet Take 1 tablet (600 mg total) by mouth 3 (three) times daily. 270 tablet 1  . hydrochlorothiazide (HYDRODIURIL) 12.5 MG tablet TAKE 1 TABLET(12.5 MG) BY MOUTH DAILY 90 tablet 1  . insulin degludec (TRESIBA FLEXTOUCH) 100 UNIT/ML FlexTouch Pen INJECT 0.4 MLS(40 UNITS TOTAL) UNDER THE SKIN DAILY 15 mL 5  . Insulin Pen Needle (BD PEN NEEDLE NANO U/F) 32G X 4 MM MISC 1 each by Does not apply route daily. 100 each 12  . MELATONIN PO Take 30 mg by mouth. Taking 1 tablet daily at night    . meloxicam (MOBIC) 15 MG tablet TAKE 1 TABLET(15 MG) BY MOUTH DAILY 30 tablet 0  . metFORMIN (GLUCOPHAGE) 500 MG tablet TAKE 2 TABLETS(1000 MG) BY MOUTH TWICE DAILY WITH A MEAL 360 tablet 0  . NOVOTWIST 32G X 5 MM MISC USE WITH LEVEMIR PEN BID  5  . omeprazole (PRILOSEC) 10 MG capsule TAKE 1 CAPSULE BY MOUTH DAILY. 90 capsule 0  . paliperidone (INVEGA SUSTENNA) 234 MG/1.5ML SUSY injection Inject 234 mg into the muscle once for 1 dose. 1.5 mL 11  . phenazopyridine (PYRIDIUM) 200 MG tablet Take 1 tablet (200 mg total) by mouth 3 (three) times daily as needed for pain. 10 tablet 0  . pioglitazone (ACTOS) 45 MG tablet TAKE 1 TABLET(45 MG) BY MOUTH DAILY 90 tablet 1  . Semaglutide,0.25 or 0.5MG /DOS, (OZEMPIC, 0.25 OR 0.5 MG/DOSE,) 2 MG/1.5ML SOPN Inject 0.375 mLs (0.5 mg total) into the skin once a week. 1 pen 5  . [START ON 10/17/2020] Vilazodone HCl (VIIBRYD) 40 MG TABS Take 1 tablet (40 mg total) by mouth daily. 90 tablet 1   Current Facility-Administered  Medications  Medication Dose Route Frequency Provider Last Rate Last Admin  .  paliperidone (INVEGA SUSTENNA) injection 234 mg  234 mg Intramuscular Q30 days Zena Amos, MD   234 mg at 09/05/20 1105     Musculoskeletal: Strength & Muscle Tone: N/A Gait & Station: N/A Patient leans: N/A  Psychiatric Specialty Exam: Review of Systems  Psychiatric/Behavioral: Positive for decreased concentration, dysphoric mood, hallucinations and sleep disturbance. Negative for agitation, behavioral problems, confusion, self-injury and suicidal ideas. The patient is nervous/anxious. The patient is not hyperactive.   All other systems reviewed and are negative.   There were no vitals taken for this visit.There is no height or weight on file to calculate BMI.  General Appearance: Fairly Groomed  Eye Contact:  Good  Speech:  Clear and Coherent  Volume:  Normal  Mood:  Anxious  Affect:  Appropriate, Congruent and slightly restricted  Thought Process:  Coherent  Orientation:  Full (Time, Place, and Person)  Thought Content: Logical   Suicidal Thoughts:  No  Homicidal Thoughts:  No  Memory:  Immediate;   Good  Judgement:  Good  Insight:  Good  Psychomotor Activity:  Normal  Concentration:  Concentration: Good and Attention Span: Good  Recall:  Good  Fund of Knowledge: Good  Language: Good  Akathisia:  No  Handed:  Right  AIMS (if indicated): not done  Assets:  Communication Skills Desire for Improvement  ADL's:  Intact  Cognition: WNL  Sleep:  Poor   Screenings: AIMS   Flowsheet Row Office Visit from 05/12/2017 in [facility] Office Visit from 07/29/2016 in [facility] Office Visit from 05/06/2016 in [facility] Office Visit from 04/01/2016 in [facility] Office Visit from 03/28/2015 in [facility]  AIMS Total Score 0 0 0 0 0    GAD-7   Flowsheet Row Office Visit from 04/28/2018 in [facility]  Total GAD-7 Score 8     Mini-Mental   Flowsheet Row Office Visit from 09/07/2019 in [facility]  Total Score (max 30 points ) 16    PHQ2-9   Flowsheet Row Clinical Support from 09/03/2020 in [facility] Clinical Support from 01/19/2019 in [facility] Office Visit from 04/28/2018 in [facility] Clinical Support from 01/13/2018 in [facility] Clinical Support from 01/13/2017 in [facility]  PHQ-2 Total Score 2 0 4 2 2   PHQ-9 Total Score 15 -- 11 8 12        Assessment and Plan:  STEPHANIEMARIE STOFFEL is a 86 y.o. year old female with a history of schizoaffective disorder,  sleep apnea, diabetes, hypertension, hyperlipidemia, GERD, who is transferred from Dr. [name].   1. Schizoaffective disorder, depressive type (HCC) 2. Obsessive-compulsive disorder, unspecified type She reports worsening in paranoia with AH, and depressive symptoms/anxiety since the last visit.  She was taking perphenazine twice a day with the thought that it was directed by Dr. 52.  We will increase the dose of the tablet to improve adherence.  Discussed potential risk of metabolic side effect and EPS.  Will continue monthly Invega given her poor adherence to medication.  We will continue Viibryd to target depression, anxiety.  Will continue gabapentin for anxiety.  She has not been taking clonazepam due to concern of potential drowsiness; will discontinue this medication.  It is unclear whether amantadine has been prescribed for possible tardive dyskinesia, although she denies any side effect from antipsychotics.  We will continue the current dose at this time with plan to taper it off in the future.  She will greatly benefit from CBT; she will consider this option until the next visit.    Plan 1. Continue monthly  invega 234 mg IM 2. Increase perphenazine 8 mg daily 3. Continue viibryd 40 mg 4. Continue amantadine 100  Mg bid 5. Continue gabapentin 600 mg tid 6. Discontinue  amantadine 7. Next appointment: 6/29 at 2 PM for 30 mins, video. 7/18 at 1:30 for 30 mins, in person  Emergency resources which includes 911, ED, suicide crisis line [phone]) are discussed.  The patient demonstrates the following risk factors for suicide: Chronic risk factors for suicide include: psychiatric disorder of schizoaffective disorder. Acute risk factors for suicide include: unemployment. Protective factors for this patient include: positive social support, responsibility to others (children, family), hope for the future and religious beliefs against suicide. Considering these factors, the overall suicide risk at this point appears to be low. Patient is appropriate for outpatient follow up.  Although she has guns at home, she does not have access to them.   [name], MD 09/17/2020, 2:56 PM

## 2020-09-17 NOTE — Patient Instructions (Signed)
1. Continue monthly  invega 234 mg IM 2. Increase perphenazine 8 mg daily 3. Continue viibryd 40 mg 4. Continue amantadine 100  Mg bid 5. Continue gabapentin 600 mg tid 6. Discontinue amantadine 7. Next appointment: 6/29 at 2 PM

## 2020-09-25 ENCOUNTER — Telehealth: Payer: Self-pay

## 2020-09-25 ENCOUNTER — Other Ambulatory Visit: Payer: Self-pay | Admitting: Psychiatry

## 2020-09-25 MED ORDER — PERPHENAZINE 4 MG PO TABS: 4.0000 mg | ORAL_TABLET | Freq: Every day | ORAL | 0 refills | Status: DC

## 2020-09-25 NOTE — Telephone Encounter (Signed)
pt called she states that the increase in the perphenazine is making her nauses and having mood changes. she stated that she was doing ok on the 4mg  but with the increase it is just makin her sick.

## 2020-09-25 NOTE — Telephone Encounter (Signed)
Advise her to reduce perphenazine back to 4 mg at night. New orders are sent in. Please contact the pharmacy to discontinue 8 mg tab.

## 2020-10-07 ENCOUNTER — Other Ambulatory Visit: Payer: Self-pay

## 2020-10-07 ENCOUNTER — Ambulatory Visit (INDEPENDENT_AMBULATORY_CARE_PROVIDER_SITE_OTHER): Payer: Medicare Other

## 2020-10-07 DIAGNOSIS — F251 Schizoaffective disorder, depressive type: Secondary | ICD-10-CM

## 2020-10-07 DIAGNOSIS — F3170 Bipolar disorder, currently in remission, most recent episode unspecified: Secondary | ICD-10-CM

## 2020-10-07 DIAGNOSIS — F429 Obsessive-compulsive disorder, unspecified: Secondary | ICD-10-CM

## 2020-10-07 NOTE — Patient Instructions (Signed)
Patient given injection in right upper outer quadrant.  Patient reports that she is doing good.  Patient tolerated injection well.  Will return in 28 days .   invega sustenna 234mg / 1.19ml ndc # 4m s/n [id number],  Exp 01-25-22,  Lot #[id number]

## 2020-10-07 NOTE — Progress Notes (Signed)
Patient given injection in right upper outer quadrant.  Patient reports that she is doing good.  Patient tolerated injection well.  Will return in 28 days .   invega sustenna 234mg/ 1.5ml ndc # [id number] s/n [id number],  Exp 01-25-22,  Lot #[id number] 

## 2020-10-11 ENCOUNTER — Other Ambulatory Visit: Payer: Self-pay | Admitting: Nurse Practitioner

## 2020-10-11 ENCOUNTER — Other Ambulatory Visit: Payer: Self-pay | Admitting: Family Medicine

## 2020-10-11 DIAGNOSIS — Z794 Long term (current) use of insulin: Secondary | ICD-10-CM

## 2020-10-11 NOTE — Telephone Encounter (Signed)
Notes to clinic: Patient has appt on 10/18/20 Review for 90 day refill   Requested Prescriptions  Pending Prescriptions Disp Refills   metFORMIN (GLUCOPHAGE) 500 MG tablet [Pharmacy Med Name: METFORMIN $RemoveBeforeD'500MG'[id number]$  TABLETS] 360 tablet 0    Sig: TAKE 2 TABLETS(1000 MG) BY MOUTH TWICE DAILY WITH A MEAL      Endocrinology:  Diabetes - Biguanides Failed - 10/11/2020  1:53 PM      Failed - Cr in normal range and within 360 days    Creat  Date Value Ref Range Status  09/26/2015 0.73 0.50 - 1.10 mg/dL Final   Creatinine, Ser  Date Value Ref Range Status  09/07/2019 0.90 0.57 - 1.00 mg/dL Final          Failed - HBA1C is between 0 and 7.9 and within 180 days    HB A1C (BAYER DCA - WAIVED)  Date Value Ref Range Status  10/22/2017 6.9 <7.0 % Final    Comment:                                          Diabetic Adult            <7.0                                       Healthy Adult        4.3 - 5.7                                                           (DCCT/NGSP) American Diabetes Association's Summary of Glycemic Recommendations for Adults with Diabetes: Hemoglobin A1c <7.0%. More stringent glycemic goals (A1c <6.0%) may further reduce complications at the cost of increased risk of hypoglycemia.    Hgb A1c MFr Bld  Date Value Ref Range Status  09/07/2019 6.6 (H) 4.8 - 5.6 % Final    Comment:             Prediabetes: 5.7 - 6.4          Diabetes: >6.4          Glycemic control for adults with diabetes: <7.0           Failed - eGFR in normal range and within 360 days    GFR calc Af Amer  Date Value Ref Range Status  09/07/2019 88 >59 mL/min/1.73 Final    Comment:    **[facility] currently reports eGFR in compliance with the current**   recommendations of the [employer]. [facility] will   update reporting as new guidelines are published from the NKF-ASN   Task force.    GFR calc non Af Amer  Date Value Ref Range Status  09/07/2019 76 >59 mL/min/1.73 Final           Passed - Valid encounter within last 6 months    Recent Outpatient Visits           5 months ago Encounter to establish care with new doctor   [facility], Lupita Raider, FNP   9 months ago Acute lower UTI   [facility]  Volney American, PA-C   12 months ago Type 2 diabetes mellitus with hyperglycemia, with long-term current use of insulin [facility])   [place], Lilia Argue, [place]   1 year ago [place], [place] T, NP   1 year ago Controlled type 2 diabetes mellitus without complication, with long-term current use of insulin [facility])   [place], Lilia Argue, [place]       Future Appointments             In 1 week Baity, Coralie Keens, NP [facility], [place]   In 11 months  [facility], [facility]

## 2020-10-12 NOTE — Telephone Encounter (Signed)
Requested medication (s) are due for refill today: 10/17/19  Requested medication (s) are on the active medication list: yes  Last refill:  10/17/19  Future visit scheduled: yes  Notes to clinic:  overdue lab work   Requested Prescriptions  Pending Prescriptions Disp Refills   TRESIBA FLEXTOUCH 100 UNIT/ML FlexTouch Pen [Pharmacy Med Name: TRESIBA FLEXTOUCH PEN ([id number]] 15 mL 5    Sig: INJECT 40 UNITS UNDER THE SKIN EVERY DAY      Endocrinology:  Diabetes - Insulins Failed - 10/11/2020  6:05 PM      Failed - HBA1C is between 0 and 7.9 and within 180 days    HB A1C (BAYER DCA - WAIVED)  Date Value Ref Range Status  10/22/2017 6.9 <7.0 % Final    Comment:                                          Diabetic Adult            <7.0                                       Healthy Adult        4.3 - 5.7                                                           (DCCT/NGSP) American Diabetes Association's Summary of Glycemic Recommendations for Adults with Diabetes: Hemoglobin A1c <7.0%. More stringent glycemic goals (A1c <6.0%) may further reduce complications at the cost of increased risk of hypoglycemia.    Hgb A1c MFr Bld  Date Value Ref Range Status  09/07/2019 6.6 (H) 4.8 - 5.6 % Final    Comment:             Prediabetes: 5.7 - 6.4          Diabetes: >6.4          Glycemic control for adults with diabetes: <7.0           Passed - Valid encounter within last 6 months    Recent Outpatient Visits           6 months ago Encounter to establish care with new doctor   [facility], Jodelle Gross, FNP   9 months ago Acute lower UTI   [facility] Roosvelt Maser [place], [place]   12 months ago Type 2 diabetes mellitus with hyperglycemia, with long-term current use of insulin [facility])   [facility], Salley Hews, [place]   1 year ago Rash   [facility] Munjor, [place] T, NP   1 year ago Controlled type 2 diabetes mellitus  without complication, with long-term current use of insulin [facility])   [facility], Salley Hews, [place]       Future Appointments             In 6 days Baity, Salvadore Oxford, NP [facility]   In 11 months  [facility], [facility]

## 2020-10-17 ENCOUNTER — Ambulatory Visit: Payer: Medicare Other | Admitting: Internal Medicine

## 2020-10-18 ENCOUNTER — Ambulatory Visit: Payer: Medicare Other | Admitting: Internal Medicine

## 2020-10-18 NOTE — Progress Notes (Signed)
Virtual Visit via Video Note  I connected with Tabitha [name] on 10/23/20 at  2:00 PM EDT by a video enabled telemedicine application and verified that I am speaking with the correct person using two identifiers.  Location: Patient: home Provider: office Persons participated in the visit- patient, provider    I discussed the limitations of evaluation and management by telemedicine and the availability of in person appointments. The patient expressed understanding and agreed to proceed.    I discussed the assessment and treatment plan with the patient. The patient was provided an opportunity to ask questions and all were answered. The patient agreed with the plan and demonstrated an understanding of the instructions.   The patient was advised to call back or seek an in-person evaluation if the symptoms worsen or if the condition fails to improve as anticipated.  I provided 15 minutes of non-face-to-face time during this encounter.   Neysa Hottereina Whittney Steenson, MD    [facility] OP Progress Note  10/23/2020 2:27 PM Tabitha HouseholderKathleen J [name]  MRN:  [id number]  Chief Complaint:  Chief Complaint   Follow-up; Schizophrenia    HPI: - Perphenazine was decreased back to 4 mg at night due to nausea/feeling sick  This is a follow-up appointment for schizoaffective disorder.  She states that she has been doing better.  She has less age of people calling her names.  She occasionally feels that some people are out to get her, although it has been getting better.  She goes to church occasionally due to this paranoia.  She states that these symptoms has been going on for a while.  Her husband's situation has been getting better.  He has been trying to dismiss the case.  She occasionally feels anxious thinking about what would happen in the future around him.  She is not allowed to tell what it is with this clinician.  She has occasional insomnia with anxiety.  Although she feels down at times, she feels better when  she is with her children.  She enjoyed meeting goals last week.  She has increasing in appetite.  Although she has been trying to do exercise bike , she has not been able to do it every day as she feels tired.  Although she denies any recent weight gain, she has been gaining weight over time.  She denies SI. She has occasional panic attacks. She feels comfortable to stay on the current medication.   Daily routine: house chores, goes to church weekly. Choir on Wed,  Exercise: Employment: unemployed, on disability due to paranoia (felt people coming to her workplace). used to work at [employer] until 2007 (quit due to pregnancy) Support: husband, son, parents Household: husband, 2 sons (age 49,15) Marital status: married Number of children: 2 sons Education: college, majored in Psychologist, educationalart, music  Wt Readings from Last 3 Encounters:  10/07/20 244 lb 0.2 oz (110.7 kg)  09/05/20 243 lb 12.8 oz (110.6 kg)  09/03/20 242 lb (109.8 kg)     Visit Diagnosis:    ICD-10-CM   1. Schizoaffective disorder, unspecified type (HCC)  F25.9     2. Obsessive-compulsive disorder, unspecified type  F42.9     3. Anxiety state  F41.1       Past Psychiatric History: Please see initial evaluation for full details. I have reviewed the history. No updates at this time.     Past Medical History:  Past Medical History:  Diagnosis Date   Allergic rhinitis    Anxiety  Asthma    Bipolar disorder (HCC)    Depression    Diabetes mellitus without complication (HCC)    GERD (gastroesophageal reflux disease)    Hyperlipidemia    Hypertension    Mild sleep apnea    OCD (obsessive compulsive disorder)    Palpitations    Paranoia (HCC)    Schizoaffective disorder (HCC)    Sleep apnea     Past Surgical History:  Procedure Laterality Date   CESAREAN SECTION  2006/2008   CHOLECYSTECTOMY  2006    Family Psychiatric History: Please see initial evaluation for full details. I have reviewed the history. No updates at  this time.     Family History:  Family History  Problem Relation Age of Onset   Diabetes Father    Hyperlipidemia Father    Diabetes Brother    Lupus Mother    Diabetes Mother        pre diabetes    Social History:  Social History   Socioeconomic History   Marital status: Married    Spouse name: kevin   Number of children: 2   Years of education: Not on file   Highest education level: Bachelor's degree (e.g., BA, AB, BS)  Occupational History   Occupation: disability  Tobacco Use   Smoking status: Never   Smokeless tobacco: Never  Vaping Use   Vaping Use: Never used  Substance and Sexual Activity   Alcohol use: No    Alcohol/week: 0.0 standard drinks   Drug use: No   Sexual activity: Yes    Partners: Male    Birth control/protection: None, Surgical  Other Topics Concern   Not on file  Social History Narrative   Not on file   Social Determinants of Health   Financial Resource Strain: Low Risk    Difficulty of Paying Living Expenses: Not hard at all  Food Insecurity: No Food Insecurity   Worried About Programme researcher, broadcasting/film/video in the Last Year: Never true   Ran Out of Food in the Last Year: Never true  Transportation Needs: No Transportation Needs   Lack of Transportation (Medical): No   Lack of Transportation (Non-Medical): No  Physical Activity: Inactive   Days of Exercise per Week: 0 days   Minutes of Exercise per Session: 0 min  Stress: Stress Concern Present   Feeling of Stress : To some extent  Social Connections: Not on file    Allergies:  Allergies  Allergen Reactions   Abilify [Aripiprazole] Hives    Metabolic Disorder Labs: Lab Results  Component Value Date   HGBA1C 6.6 (H) 09/07/2019   Lab Results  Component Value Date   PROLACTIN 84.8 (H) 06/01/2017   PROLACTIN 36.2 (H) 03/26/2016   Lab Results  Component Value Date   CHOL 138 09/07/2019   TRIG 202 (H) 09/07/2019   HDL 55 09/07/2019   CHOLHDL 2.9 09/26/2015   VLDL 51 (H) 04/23/2017    LDLCALC 51 09/07/2019   LDLCALC 63 04/28/2018   Lab Results  Component Value Date   TSH 1.290 04/28/2018   TSH 2.570 06/01/2017    Therapeutic Level Labs: No results found for: LITHIUM Lab Results  Component Value Date   VALPROATE 52 10/22/2017   VALPROATE 43 (L) 06/01/2017   No components found for:  CBMZ  Current Medications: Current Outpatient Medications  Medication Sig Dispense Refill   ACCU-CHEK GUIDE test strip USE TO CHECK BLOOD SUGAR TWICE DAILY 100 strip 2   albuterol (VENTOLIN HFA) 108 (90  Base) MCG/ACT inhaler INHALE 2 PUFFS INTO THE LUNGS EVERY 6 HOURS AS NEEDED FOR WHEEZING OR SHORTNESS OF BREATH 25.5 g 3   amantadine (SYMMETREL) 100 MG capsule Take 1 capsule (100 mg total) by mouth 2 (two) times daily. 180 capsule 0   atorvastatin (LIPITOR) 20 MG tablet TAKE 1 TABLET(20 MG) BY MOUTH AT BEDTIME 90 tablet 0   doxycycline (VIBRAMYCIN) 50 MG capsule TAKE 1 CAPSULE(50 MG) BY MOUTH EVERY MORNING 90 capsule 0   fluticasone (FLOVENT HFA) 110 MCG/ACT inhaler Inhale 1 puff into the lungs 2 (two) times daily. 1 Inhaler 11   gabapentin (NEURONTIN) 600 MG tablet Take 1 tablet (600 mg total) by mouth 3 (three) times daily. 270 tablet 1   hydrochlorothiazide (HYDRODIURIL) 12.5 MG tablet TAKE 1 TABLET(12.5 MG) BY MOUTH DAILY 90 tablet 1   insulin degludec (TRESIBA FLEXTOUCH) 100 UNIT/ML FlexTouch Pen INJECT 0.4 MLS(40 UNITS TOTAL) UNDER THE SKIN DAILY 15 mL 5   Insulin Pen Needle (BD PEN NEEDLE NANO U/F) 32G X 4 MM MISC 1 each by Does not apply route daily. 100 each 12   MELATONIN PO Take 30 mg by mouth. Taking 1 tablet daily at night     meloxicam (MOBIC) 15 MG tablet TAKE 1 TABLET(15 MG) BY MOUTH DAILY 30 tablet 0   metFORMIN (GLUCOPHAGE) 500 MG tablet TAKE 2 TABLETS(1000 MG) BY MOUTH TWICE DAILY WITH A MEAL 360 tablet 0   NOVOTWIST 32G X 5 MM MISC USE WITH LEVEMIR PEN BID  5   omeprazole (PRILOSEC) 10 MG capsule TAKE 1 CAPSULE BY MOUTH DAILY. 90 capsule 0   paliperidone  (INVEGA SUSTENNA) 234 MG/1.5ML SUSY injection Inject 234 mg into the muscle once for 1 dose. 1.5 mL 11   perphenazine (TRILAFON) 4 MG tablet Take 1 tablet (4 mg total) by mouth at bedtime. 90 tablet 0   phenazopyridine (PYRIDIUM) 200 MG tablet Take 1 tablet (200 mg total) by mouth 3 (three) times daily as needed for pain. 10 tablet 0   pioglitazone (ACTOS) 45 MG tablet TAKE 1 TABLET(45 MG) BY MOUTH DAILY 90 tablet 1   Semaglutide,0.25 or 0.5MG /DOS, (OZEMPIC, 0.25 OR 0.5 MG/DOSE,) 2 MG/1.5ML SOPN Inject 0.375 mLs (0.5 mg total) into the skin once a week. 1 pen 5   Vilazodone HCl (VIIBRYD) 40 MG TABS Take 1 tablet (40 mg total) by mouth daily. 90 tablet 1   Current Facility-Administered Medications  Medication Dose Route Frequency Provider Last Rate Last Admin   paliperidone (INVEGA SUSTENNA) injection 234 mg  234 mg Intramuscular Q30 days Zena Amos, MD   234 mg at 10/07/20 1107     Musculoskeletal: Strength & Muscle Tone:  N/A Gait & Station:  N/A Patient leans: N/A  Psychiatric Specialty Exam: Review of Systems  Psychiatric/Behavioral:  Positive for dysphoric mood and sleep disturbance. Negative for agitation, behavioral problems, confusion, decreased concentration, hallucinations, self-injury and suicidal ideas. The patient is nervous/anxious. The patient is not hyperactive.   All other systems reviewed and are negative.  There were no vitals taken for this visit.There is no height or weight on file to calculate BMI.  General Appearance: Fairly Groomed  Eye Contact:  Good  Speech:  Clear and Coherent  Volume:  Normal  Mood:   better  Affect:  Appropriate, Congruent, and slightly anxious at times  Thought Process:  Coherent  Orientation:  Full (Time, Place, and Person)  Thought Content: Logical and Paranoid Ideation   Suicidal Thoughts:  No  Homicidal Thoughts:  No  Memory:  Immediate;   Good  Judgement:  Good  Insight:  Fair  Psychomotor Activity:  Normal  Concentration:   Concentration: Good and Attention Span: Good  Recall:  Good  Fund of Knowledge: Good  Language: Good  Akathisia:  No  Handed:  Right  AIMS (if indicated): not done  Assets:  Communication Skills Desire for Improvement  ADL's:  Intact  Cognition: WNL  Sleep:  Fair   Screenings: AIMS    Flowsheet Row Office Visit from [facility]/12/2017 in [facility] Office Visit from 07/29/2016 in [facility] Office Visit from 05/06/2016 in [facility] Office Visit from 04/01/2016 in [facility] Office Visit from 03/28/2015 in [facility]  AIMS Total Score 0 0 0 0 0      GAD-7    Flowsheet Row Office Visit from 04/28/2018 in [facility]  Total GAD-7 Score 8      Mini-Mental    Flowsheet Row Office Visit from 09/07/2019 in [facility]  Total Score (max 30 points ) 16      PHQ2-9    Flowsheet Row Clinical Support from 09/03/2020 in [facility] Clinical Support from 01/19/2019 in [facility] Office Visit from 04/28/2018 in [facility] Clinical Support from 01/13/2018 in [facility] Clinical Support from 01/13/2017 in [facility]  PHQ-2 Total Score 2 0 4 2 2   PHQ-9 Total Score 15 -- 11 8 12       Flowsheet Row Video Visit from 10/23/2020 in [facility]  C-SSRS RISK CATEGORY No Risk        Assessment and Plan:  STARLYN DROGE is a 50 y.o. year old female with a history of schizoaffective disorder,  sleep apnea (CPAP machine), diabetes, hypertension, hyperlipidemia, GERD, who presents for follow up appointment for below.   1. Schizoaffective disorder, unspecified type (HCC) 2. Obsessive-compulsive disorder, unspecified type 3. Anxiety state There has been overall improvement in hallucinations, paranoia and anxiety since the last visit, which  coincided it was the situation with her husband has been getting improved (legal is involved, and she is unable to elaborate this).  She could not tolerate higher dose of perphenazine as she feels sick/nausea.  Will continue current dose of perphenazine to target schizoaffective disorder.  Will continue monthly Invega given her history of nonadherence to medication.  Will continue Viibryd to target depression and anxiety.  Will continue gabapentin for anxiety.  Will continue amantadine at this time; it is unclear whether she has been on this medication for tardive dyskinesia. Will consider tapering down at the next visit. She will greatly benefit from CBT; she will consider this option until the next visit.    Plan 1. Continue monthly invega 234 mg IM - monitor weight gain. She is on metformin 2. Continue perphenazine 4 mg daily 3. Continue viibryd 40 mg 4. Continue amantadine 100 mg twice a day 5. Continue gabapentin 600 mg three times a day 6. Next appointment: 7/18 at 1:30 for 30 mins, in person - She will [facility]   The patient demonstrates the following risk factors for suicide: Chronic risk factors for suicide include: psychiatric disorder of schizoaffective disorder. Acute risk factors for suicide include: unemployment. Protective factors for this patient include: positive social support, responsibility to others (children, family), hope for the future and religious beliefs against suicide. Considering these factors, the overall suicide risk at  this point appears to be low. Patient is appropriate for outpatient follow up.  Although she has guns at home, she does not have access to them.      [name], MD 10/23/2020, 2:27 PM

## 2020-10-23 ENCOUNTER — Encounter: Payer: Self-pay | Admitting: Psychiatry

## 2020-10-23 ENCOUNTER — Telehealth (INDEPENDENT_AMBULATORY_CARE_PROVIDER_SITE_OTHER): Payer: Medicare Other | Admitting: Psychiatry

## 2020-10-23 ENCOUNTER — Other Ambulatory Visit: Payer: Self-pay

## 2020-10-23 DIAGNOSIS — F411 Generalized anxiety disorder: Secondary | ICD-10-CM | POA: Diagnosis not present

## 2020-10-23 DIAGNOSIS — F429 Obsessive-compulsive disorder, unspecified: Secondary | ICD-10-CM | POA: Diagnosis not present

## 2020-10-23 DIAGNOSIS — F259 Schizoaffective disorder, unspecified: Secondary | ICD-10-CM

## 2020-10-23 MED ORDER — PERPHENAZINE 4 MG PO TABS: 4.0000 mg | ORAL_TABLET | Freq: Every day | ORAL | 0 refills | Status: DC

## 2020-10-23 NOTE — Patient Instructions (Signed)
1. Continue monthly invega 234 mg IM 2. Continue perphenazine 4 mg daily 3. Continue viibryd 40 mg 4. Continue amantadine 100 mg twice a day 5. Continue gabapentin 600 mg three times a day 6. Next appointment: 7/18 at 1:30

## 2020-10-25 ENCOUNTER — Other Ambulatory Visit: Payer: Self-pay | Admitting: Family Medicine

## 2020-10-25 DIAGNOSIS — L719 Rosacea, unspecified: Secondary | ICD-10-CM

## 2020-10-25 DIAGNOSIS — K219 Gastro-esophageal reflux disease without esophagitis: Secondary | ICD-10-CM

## 2020-10-25 NOTE — Telephone Encounter (Signed)
Requested medication (s) are due for refill today: no  Requested medication (s) are on the active medication list: yes   Last refill: 07/25/2020  Future visit scheduled: yes   Notes to clinic: medication not assigned to a protocol, Review manually   Requested Prescriptions  Pending Prescriptions Disp Refills   doxycycline (VIBRAMYCIN) 50 MG capsule [Pharmacy Med Name: DOXYCYCLINE HYC 50MG  CAPS] 90 capsule 0    Sig: TAKE 1 CAPSULE(50 MG) BY MOUTH EVERY MORNING      Off-Protocol Failed - 10/25/2020  1:22 PM      Failed - Medication not assigned to a protocol, review manually.      Passed - Valid encounter within last 12 months    Recent Outpatient Visits           6 months ago Encounter to establish care with new doctor   [facility], [facility], FNP   10 months ago Acute lower UTI   [facility] [place], [place]   1 year ago Type 2 diabetes mellitus with hyperglycemia, with long-term current use of insulin [facility])   [facility], [facility], Sal[place] Hews   1 year ago Rash   [facility] [place], [place] T, NP   1 year ago Controlled type 2 diabetes mellitus without complication, with long-term current use of insulin (HCC)   [facility], [facility], Salley Hews       Future Appointments             In 10 months [facility], [facility]               Signed Prescriptions Disp Refills   omeprazole (PRILOSEC) 10 MG capsule 90 capsule 0    Sig: TAKE 1 CAPSULE BY MOUTH DAILY      Gastroenterology: Proton Pump Inhibitors Passed - 10/25/2020  1:22 PM      Passed - Valid encounter within last 12 months    Recent Outpatient Visits           6 months ago Encounter to establish care with new doctor   [facility], [facility], FNP   10 months ago Acute lower UTI   [facility], [place], [place]   1 year ago Type 2 diabetes mellitus with  hyperglycemia, with long-term current use of insulin [facility])   [facility], [facility], Salley Hews   1 year ago Rash   [facility] [place], [place] T, NP   1 year ago Controlled type 2 diabetes mellitus without complication, with long-term current use of insulin [facility])   [facility], [facility], Salley Hews       Future Appointments             In 10 months [facility], [facility]

## 2020-10-25 NOTE — Telephone Encounter (Signed)
   Notes to clinic:  medication filled by a different provider Review for continued use and refill    Requested Prescriptions  Pending Prescriptions Disp Refills   OZEMPIC, 0.25 OR 0.5 MG/DOSE, 2 MG/1.5ML SOPN [Pharmacy Med Name: OZEMPIC 0.25 OR 0.5MG /DOS 1X2MG  PEN] 1.5 mL     Sig: ADMINISTER 0.5 MG UNDER THE SKIN 1 TIME A WEEK      Endocrinology:  Diabetes - GLP-1 Receptor Agonists Failed - 10/25/2020  1:23 PM      Failed - HBA1C is between 0 and 7.9 and within 180 days    HB A1C (BAYER DCA - WAIVED)  Date Value Ref Range Status  10/22/2017 6.9 <7.0 % Final    Comment:                                          Diabetic Adult            <7.0                                       Healthy Adult        4.3 - 5.7                                                           (DCCT/NGSP) American Diabetes Association's Summary of Glycemic Recommendations for Adults with Diabetes: Hemoglobin A1c <7.0%. More stringent glycemic goals (A1c <6.0%) may further reduce complications at the cost of increased risk of hypoglycemia.    Hgb A1c MFr Bld  Date Value Ref Range Status  09/07/2019 6.6 (H) 4.8 - 5.6 % Final    Comment:             Prediabetes: 5.7 - 6.4          Diabetes: >6.4          Glycemic control for adults with diabetes: <7.0           Passed - Valid encounter within last 6 months    Recent Outpatient Visits           6 months ago Encounter to establish care with new doctor   [facility], Jodelle Gross, FNP   10 months ago Acute lower UTI   [facility], [place], [place]   1 year ago Type 2 diabetes mellitus with hyperglycemia, with long-term current use of insulin [facility])   [facility], Sal[place] Hews, [place]   1 year ago Rash   [facility] [place], [place] T, NP   1 year ago Controlled type 2 diabetes mellitus without complication, with long-term current use of insulin [facility])   [facility], [place] Hews, [place]       Future Appointments             In 10 months [facility], [facility]

## 2020-10-29 DIAGNOSIS — G4733 Obstructive sleep apnea (adult) (pediatric): Secondary | ICD-10-CM | POA: Diagnosis not present

## 2020-11-05 ENCOUNTER — Other Ambulatory Visit: Payer: Self-pay | Admitting: Family Medicine

## 2020-11-05 DIAGNOSIS — E119 Type 2 diabetes mellitus without complications: Secondary | ICD-10-CM

## 2020-11-05 DIAGNOSIS — Z794 Long term (current) use of insulin: Secondary | ICD-10-CM

## 2020-11-05 NOTE — Telephone Encounter (Signed)
Due in August.
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
## 2020-11-06 ENCOUNTER — Ambulatory Visit: Payer: Medicare Other

## 2020-11-11 ENCOUNTER — Ambulatory Visit: Payer: Medicare Other

## 2020-11-11 ENCOUNTER — Ambulatory Visit: Payer: Medicare Other | Admitting: Psychiatry

## 2020-11-11 ENCOUNTER — Other Ambulatory Visit: Payer: Self-pay

## 2020-11-27 ENCOUNTER — Telehealth (INDEPENDENT_AMBULATORY_CARE_PROVIDER_SITE_OTHER): Payer: Medicare Other | Admitting: Internal Medicine

## 2020-11-27 ENCOUNTER — Encounter: Payer: Self-pay | Admitting: Internal Medicine

## 2020-11-27 VITALS — BP 136/68 | HR 85 | Temp 97.1°F

## 2020-11-27 DIAGNOSIS — J019 Acute sinusitis, unspecified: Secondary | ICD-10-CM | POA: Diagnosis not present

## 2020-11-27 DIAGNOSIS — J4531 Mild persistent asthma with (acute) exacerbation: Secondary | ICD-10-CM | POA: Diagnosis not present

## 2020-11-27 DIAGNOSIS — U071 COVID-19: Secondary | ICD-10-CM

## 2020-11-27 DIAGNOSIS — B9689 Other specified bacterial agents as the cause of diseases classified elsewhere: Secondary | ICD-10-CM | POA: Diagnosis not present

## 2020-11-27 MED ORDER — AMOXICILLIN-POT CLAVULANATE 875-125 MG PO TABS: 1.0000 | ORAL_TABLET | Freq: Two times a day (BID) | ORAL | 0 refills | Status: DC

## 2020-11-27 NOTE — Progress Notes (Signed)
Virtual Visit via Video Note  I connected with Tabitha [name] on 11/27/20 at 11:20 AM EDT by a video enabled telemedicine application and verified that I am speaking with the correct person using two identifiers.  Location: Patient: Home Provider: Office  Persons participating in this video call: Nicki Reaper, NP and [name]   I discussed the limitations of evaluation and management by telemedicine and the availability of in person appointments. The patient expressed understanding and agreed to proceed.  History of Present Illness:  Patient reports headache, dizziness, facial pain and pressure, nasal congestion, ear pain, cough, wheezing, shortness of breath, diarrhea, body aches and chills.  She reports this started 10 days ago.  The headache is located in her sinuses.  She describes the pain as pressure.  She is blowing yellow mucus out of her nose.  She describes the ear pain as fullness without loss of hearing or drainage.  The cough is productive of brown mucus.  She denies sore throat, chest pain, nausea or vomiting.  She denies fevers.  She was diagnosed with COVID 10 days ago.  She has not been taking any medications other than what she is prescribed.  Past Medical History:  Diagnosis Date   Allergic rhinitis    Anxiety    Asthma    Bipolar disorder (HCC)    Depression    Diabetes mellitus without complication (HCC)    GERD (gastroesophageal reflux disease)    Hyperlipidemia    Hypertension    Mild sleep apnea    OCD (obsessive compulsive disorder)    Palpitations    Paranoia (HCC)    Schizoaffective disorder (HCC)    Sleep apnea     Current Outpatient Medications  Medication Sig Dispense Refill   ACCU-CHEK GUIDE test strip USE TO CHECK BLOOD SUGAR TWICE DAILY 100 strip 2   albuterol (VENTOLIN HFA) 108 (90 Base) MCG/ACT inhaler INHALE 2 PUFFS INTO THE LUNGS EVERY 6 HOURS AS NEEDED FOR WHEEZING OR SHORTNESS OF BREATH 25.5 g 3   amantadine (SYMMETREL) 100 MG  capsule Take 1 capsule (100 mg total) by mouth 2 (two) times daily. 180 capsule 0   atorvastatin (LIPITOR) 20 MG tablet TAKE 1 TABLET(20 MG) BY MOUTH AT BEDTIME 90 tablet 0   doxycycline (VIBRAMYCIN) 50 MG capsule TAKE 1 CAPSULE(50 MG) BY MOUTH EVERY MORNING 90 capsule 0   fluticasone (FLOVENT HFA) 110 MCG/ACT inhaler Inhale 1 puff into the lungs 2 (two) times daily. 1 Inhaler 11   gabapentin (NEURONTIN) 600 MG tablet Take 1 tablet (600 mg total) by mouth 3 (three) times daily. 270 tablet 1   hydrochlorothiazide (HYDRODIURIL) 12.5 MG tablet TAKE 1 TABLET(12.5 MG) BY MOUTH DAILY 90 tablet 1   insulin degludec (TRESIBA FLEXTOUCH) 100 UNIT/ML FlexTouch Pen INJECT 0.4 MLS(40 UNITS TOTAL) UNDER THE SKIN DAILY 15 mL 5   Insulin Pen Needle (BD PEN NEEDLE NANO U/F) 32G X 4 MM MISC 1 each by Does not apply route daily. 100 each 12   MELATONIN PO Take 30 mg by mouth. Taking 1 tablet daily at night     meloxicam (MOBIC) 15 MG tablet TAKE 1 TABLET(15 MG) BY MOUTH DAILY 30 tablet 0   metFORMIN (GLUCOPHAGE) 500 MG tablet TAKE 2 TABLETS(1000 MG) BY MOUTH TWICE DAILY WITH A MEAL 360 tablet 0   NOVOTWIST 32G X 5 MM MISC USE WITH LEVEMIR PEN BID  5   omeprazole (PRILOSEC) 10 MG capsule TAKE 1 CAPSULE BY MOUTH DAILY 90 capsule 0  paliperidone (INVEGA SUSTENNA) 234 MG/1.5ML SUSY injection Inject 234 mg into the muscle once for 1 dose. 1.5 mL 11   perphenazine (TRILAFON) 4 MG tablet Take 1 tablet (4 mg total) by mouth at bedtime. 90 tablet 0   phenazopyridine (PYRIDIUM) 200 MG tablet Take 1 tablet (200 mg total) by mouth 3 (three) times daily as needed for pain. 10 tablet 0   pioglitazone (ACTOS) 45 MG tablet TAKE 1 TABLET(45 MG) BY MOUTH DAILY 90 tablet 1   Semaglutide,0.25 or 0.5MG /DOS, (OZEMPIC, 0.25 OR 0.5 MG/DOSE,) 2 MG/1.5ML SOPN Inject 0.375 mLs (0.5 mg total) into the skin once a week. 1 pen 5   Vilazodone HCl (VIIBRYD) 40 MG TABS Take 1 tablet (40 mg total) by mouth daily. 90 tablet 1   Current  Facility-Administered Medications  Medication Dose Route Frequency Provider Last Rate Last Admin   paliperidone (INVEGA SUSTENNA) injection 234 mg  234 mg Intramuscular Q30 days Zena Amos, MD   234 mg at 10/07/20 1107    Allergies  Allergen Reactions   Abilify [Aripiprazole] Hives    Family History  Problem Relation Age of Onset   Diabetes Father    Hyperlipidemia Father    Diabetes Brother    Lupus Mother    Diabetes Mother        pre diabetes    Social History   Socioeconomic History   Marital status: Married    Spouse name: kevin   Number of children: 2   Years of education: Not on file   Highest education level: Bachelor's degree (e.g., BA, AB, BS)  Occupational History   Occupation: disability  Tobacco Use   Smoking status: Never   Smokeless tobacco: Never  Vaping Use   Vaping Use: Never used  Substance and Sexual Activity   Alcohol use: No    Alcohol/week: 0.0 standard drinks   Drug use: No   Sexual activity: Yes    Partners: Male    Birth control/protection: None, Surgical  Other Topics Concern   Not on file  Social History Narrative   Not on file   Social Determinants of Health   Financial Resource Strain: Low Risk    Difficulty of Paying Living Expenses: Not hard at all  Food Insecurity: No Food Insecurity   Worried About Programme researcher, broadcasting/film/video in the Last Year: Never true   Ran Out of Food in the Last Year: Never true  Transportation Needs: No Transportation Needs   Lack of Transportation (Medical): No   Lack of Transportation (Non-Medical): No  Physical Activity: Inactive   Days of Exercise per Week: 0 days   Minutes of Exercise per Session: 0 min  Stress: Stress Concern Present   Feeling of Stress : To some extent  Social Connections: Not on file  Intimate Partner Violence: Not on file     Constitutional: Patient reports headache, body aches and chills.  Denies fever, malaise, fatigue, or abrupt weight changes.  HEENT: Patient reports  facial pain and pressure, nasal congestion and ear pain.  Denies eye pain, eye redness, ringing in the ears, wax buildup, runny nose, bloody nose, or sore throat. Respiratory: Patient reports cough and shortness of breath.  Denies difficulty breathing, shortness of breath   Cardiovascular: Denies chest pain, chest tightness, palpitations or swelling in the hands or feet.  Gastrointestinal: Patient reports diarrhea.  Denies abdominal pain, bloating, constipation,  or blood in the stool.   No other specific complaints in a complete review of systems (except as  listed in HPI above).    Observations/Objective:  BP 136/68   Pulse 85   Temp (!) 97.1 F (36.2 C) (Oral)   Wt Readings from Last 3 Encounters:  09/03/20 242 lb (109.8 kg)  04/15/20 244 lb 3.2 oz (110.8 kg)  12/18/19 236 lb (107 kg)    General: Appears her stated age, obese, ill-appearing but in NAD. HEENT: Head: normal shape and size;  Nose: Congestion noted; Throat/Mouth: Hoarseness noted Pulmonary/Chest: Normal effort.  No cough noted.  No respiratory distress. Neurological: Alert and oriented.   BMET    Component Value Date/Time   NA 137 09/07/2019 1657   K 4.1 09/07/2019 1657   CL 94 (L) 09/07/2019 1657   CO2 24 09/07/2019 1657   GLUCOSE 220 (H) 09/07/2019 1657   GLUCOSE 162 (H) 09/26/2015 1154   BUN 14 09/07/2019 1657   CREATININE 0.90 09/07/2019 1657   CREATININE 0.73 09/26/2015 1154   CALCIUM 9.4 09/07/2019 1657   GFRNONAA 76 09/07/2019 1657   GFRAA 88 09/07/2019 1657    Lipid Panel     Component Value Date/Time   CHOL 138 09/07/2019 1657   CHOL 176 04/23/2017 1341   TRIG 202 (H) 09/07/2019 1657   TRIG 253 (H) 04/23/2017 1341   HDL 55 09/07/2019 1657   CHOLHDL 2.9 09/26/2015 1154   VLDL 51 (H) 04/23/2017 1341   LDLCALC 51 09/07/2019 1657    CBC    Component Value Date/Time   WBC 6.4 04/28/2018 1604   WBC 5.4 09/26/2015 1154   RBC 4.61 04/28/2018 1604   RBC 4.04 09/26/2015 1154   HGB 13.3  04/28/2018 1604   HCT 39.6 04/28/2018 1604   PLT 201 04/28/2018 1604   MCV 86 04/28/2018 1604   MCH 28.9 04/28/2018 1604   MCH 28.7 09/26/2015 1154   MCHC 33.6 04/28/2018 1604   MCHC 33.2 09/26/2015 1154   RDW 15.1 04/28/2018 1604   LYMPHSABS 2.4 04/28/2018 1604   MONOABS 378 09/26/2015 1154   EOSABS 0.1 04/28/2018 1604   BASOSABS 0.0 04/28/2018 1604    Hgb A1C Lab Results  Component Value Date   HGBA1C 6.6 (H) 09/07/2019       Assessment and Plan:  COVID-19, Acute Bacterial Sinusitis, Asthma Exacerbation:  She is too far out for oral antiviral therapy at this time Encouraged her to use Zyrtec and Flonase OTC Continue inhalers as prescribed We will hold off on steroids given her history of diabetes Rx for Augmentin 875-125 mg p.o. twice daily x10 days  Return precautions discussed  Follow Up Instructions:    I discussed the assessment and treatment plan with the patient. The patient was provided an opportunity to ask questions and all were answered. The patient agreed with the plan and demonstrated an understanding of the instructions.   The patient was advised to call back or seek an in-person evaluation if the symptoms worsen or if the condition fails to improve as anticipated.    Nicki Reaper, NP

## 2020-11-27 NOTE — Patient Instructions (Signed)

## 2020-11-28 ENCOUNTER — Other Ambulatory Visit: Payer: Self-pay | Admitting: Family Medicine

## 2020-11-28 NOTE — Telephone Encounter (Signed)
Requested medication (s) are due for refill today:  Yes  Requested medication (s) are on the active medication list:   Yes  Future visit scheduled:   No   Did a tele visit with [facility] yesterday for Covid only time seen since [facility] left   Last ordered: 10/17/2019 1 pen,  5 refills.  Returned because wasn't sure who to refill this under since video visit with Rene Kocher only contact she has had with Rene Kocher and it was for Covid symptoms.   Requested Prescriptions  Pending Prescriptions Disp Refills   OZEMPIC, 0.25 OR 0.5 MG/DOSE, 2 MG/1.5ML SOPN [Pharmacy Med Name: OZEMPIC 0.25 OR 0.5MG /DOS 1X2MG  PEN] 1.5 mL     Sig: INJECT 0.25 MG INTO THE SKIN ONCE A WEEK      Endocrinology:  Diabetes - GLP-1 Receptor Agonists Failed - 11/28/2020 12:22 PM      Failed - HBA1C is between 0 and 7.9 and within 180 days    HB A1C (BAYER DCA - WAIVED)  Date Value Ref Range Status  10/22/2017 6.9 <7.0 % Final    Comment:                                          Diabetic Adult            <7.0                                       Healthy Adult        4.3 - 5.7                                                           (DCCT/NGSP) American Diabetes Association's Summary of Glycemic Recommendations for Adults with Diabetes: Hemoglobin A1c <7.0%. More stringent glycemic goals (A1c <6.0%) may further reduce complications at the cost of increased risk of hypoglycemia.    Hgb A1c MFr Bld  Date Value Ref Range Status  09/07/2019 6.6 (H) 4.8 - 5.6 % Final    Comment:             Prediabetes: 5.7 - 6.4          Diabetes: >6.4          Glycemic control for adults with diabetes: <7.0           Passed - Valid encounter within last 6 months    Recent Outpatient Visits           Yesterday COVID-19   [facility], Salvadore Oxford, NP   7 months ago Encounter to establish care with new doctor   [facility], Jodelle Gross, FNP   11 months ago Acute lower UTI   [facility], [place], [place]   1 year ago Type 2 diabetes mellitus with hyperglycemia, with long-term current use of insulin [facility])   [facility], Salley Hews, [place]   1 year ago Rash   [facility], Dorie Rank, NP       Future Appointments  In 9 months [facility], [facility]

## 2020-11-30 ENCOUNTER — Other Ambulatory Visit: Payer: Self-pay | Admitting: Family Medicine

## 2020-11-30 NOTE — Telephone Encounter (Signed)
Requested medication (s) are due for refill today: yes  Requested medication (s) are on the active medication list: yes  Last refill:  12/07/19 #90 1 RF  Future visit scheduled: no  Notes to clinic:  needs lab work   Requested Prescriptions  Pending Prescriptions Disp Refills   hydrochlorothiazide (HYDRODIURIL) 12.5 MG tablet [Pharmacy Med Name: HYDROCHLOROTHIAZIDE 12.5MG  TABLETS] 90 tablet 1    Sig: TAKE 1 TABLET(12.5 MG) BY MOUTH DAILY      Cardiovascular: Diuretics - Thiazide Failed - 11/30/2020  9:36 AM      Failed - Ca in normal range and within 360 days    Calcium  Date Value Ref Range Status  09/07/2019 9.4 8.7 - 10.2 mg/dL Final          Failed - Cr in normal range and within 360 days    Creat  Date Value Ref Range Status  09/26/2015 0.73 0.50 - 1.10 mg/dL Final   Creatinine, Ser  Date Value Ref Range Status  09/07/2019 0.90 0.57 - 1.00 mg/dL Final          Failed - K in normal range and within 360 days    Potassium  Date Value Ref Range Status  09/07/2019 4.1 3.5 - 5.2 mmol/L Final          Failed - Na in normal range and within 360 days    Sodium  Date Value Ref Range Status  09/07/2019 137 134 - 144 mmol/L Final          Passed - Last BP in normal range    BP Readings from Last 1 Encounters:  11/27/20 136/68          Passed - Valid encounter within last 6 months    Recent Outpatient Visits           3 days ago COVID-19   [facility], Salvadore Oxford, NP   7 months ago Encounter to establish care with new doctor   [facility], Jodelle Gross, FNP   11 months ago Acute lower UTI   [facility], [place], [place]   1 year ago Type 2 diabetes mellitus with hyperglycemia, with long-term current use of insulin [facility])   [facility], Salley Hews, [place]   1 year ago Rash   [facility], Corrie Dandy T, NP       Future Appointments             In 9 months  [facility], [facility]

## 2020-12-04 ENCOUNTER — Other Ambulatory Visit: Payer: Self-pay

## 2020-12-04 ENCOUNTER — Ambulatory Visit (INDEPENDENT_AMBULATORY_CARE_PROVIDER_SITE_OTHER): Payer: Medicare Other

## 2020-12-04 DIAGNOSIS — F251 Schizoaffective disorder, depressive type: Secondary | ICD-10-CM

## 2020-12-04 DIAGNOSIS — F3170 Bipolar disorder, currently in remission, most recent episode unspecified: Secondary | ICD-10-CM | POA: Diagnosis not present

## 2020-12-04 NOTE — Progress Notes (Signed)
Virtual Visit via Video Note  I connected with Tabitha [name] on 12/05/20 at  3:40 PM EDT by a video enabled telemedicine application and verified that I am speaking with the correct person using two identifiers.  Location: Patient: home Provider: office Persons participated in the visit- patient, provider    I discussed the limitations of evaluation and management by telemedicine and the availability of in person appointments. The patient expressed understanding and agreed to proceed.    I discussed the assessment and treatment plan with the patient. The patient was provided an opportunity to ask questions and all were answered. The patient agreed with the plan and demonstrated an understanding of the instructions.   The patient was advised to call back or seek an in-person evaluation if the symptoms worsen or if the condition fails to improve as anticipated.  I provided 15 minutes of non-face-to-face time during this encounter.   [name], MD    [facility] MD/PA/NP OP Progress Note  12/05/2020 4:08 PM Tabitha [name]  MRN:  [id number]  Chief Complaint:  Chief Complaint   Follow-up; Other    HPI:  This is a follow-up appointment for schizoaffective disorder.  She states that she is not doing well.  She has paranoia that family is trying to get her.  Her family told her that she has been "crazy," not smiling.  She missed the previous dose of Invega as she was having COVID and sinus infection.  She has an upcoming court on 16th.  She is trying to trust on the lord.  She has been feeling more down.  She has insomnia.  She has lost 8 pounds since the last visit.  She denies SI.  She has AH of calling her names.  She denies VH.  She feels that people were after her.  She has ideas of reference.  She denies thought insertion.  She has been taking oral medication regularly.  She agrees to have another injection in the week given her previously missed the dose.  She has occasional  imbalance.  She agrees to see her PCP for this condition.  She reports occasional tremors, although she denies having it consistently.   Daily routine: house chores, goes to church weekly. Choir on Wed,  Exercise: Employment: unemployed, on disability due to paranoia (felt people coming to her workplace). used to work at [employer] until 2007 (quit due to pregnancy) Support: husband, son, parents Household: husband, 2 sons (age 68,15) Marital status: married Number of children: 2 sons Education: college, majored in Psychologist, educational, music  Visit Diagnosis:    ICD-10-CM   1. Schizoaffective disorder, depressive type (HCC)  F25.1     2. Obsessive-compulsive disorder, unspecified type  F42.9     3. Anxiety state  F41.1       Past Psychiatric History: Please see initial evaluation for full details. I have reviewed the history. No updates at this time.     Past Medical History:  Past Medical History:  Diagnosis Date   Allergic rhinitis    Anxiety    Asthma    Bipolar disorder (HCC)    Depression    Diabetes mellitus without complication (HCC)    GERD (gastroesophageal reflux disease)    Hyperlipidemia    Hypertension    Mild sleep apnea    OCD (obsessive compulsive disorder)    Palpitations    Paranoia (HCC)    Schizoaffective disorder (HCC)    Sleep apnea     Past Surgical History:  Procedure Laterality Date   CESAREAN SECTION  2006/2008   CHOLECYSTECTOMY  2006    Family Psychiatric History: Please see initial evaluation for full details. I have reviewed the history. No updates at this time.     Family History:  Family History  Problem Relation Age of Onset   Diabetes Father    Hyperlipidemia Father    Diabetes Brother    Lupus Mother    Diabetes Mother        pre diabetes    Social History:  Social History   Socioeconomic History   Marital status: Married    Spouse name: kevin   Number of children: 2   Years of education: Not on file   Highest education level:  Bachelor's degree (e.g., BA, AB, BS)  Occupational History   Occupation: disability  Tobacco Use   Smoking status: Never   Smokeless tobacco: Never  Vaping Use   Vaping Use: Never used  Substance and Sexual Activity   Alcohol use: No    Alcohol/week: 0.0 standard drinks   Drug use: No   Sexual activity: Yes    Partners: Male    Birth control/protection: None, Surgical  Other Topics Concern   Not on file  Social History Narrative   Not on file   Social Determinants of Health   Financial Resource Strain: Low Risk    Difficulty of Paying Living Expenses: Not hard at all  Food Insecurity: No Food Insecurity   Worried About Programme researcher, broadcasting/film/videounning Out of Food in the Last Year: Never true   Ran Out of Food in the Last Year: Never true  Transportation Needs: No Transportation Needs   Lack of Transportation (Medical): No   Lack of Transportation (Non-Medical): No  Physical Activity: Inactive   Days of Exercise per Week: 0 days   Minutes of Exercise per Session: 0 min  Stress: Stress Concern Present   Feeling of Stress : To some extent  Social Connections: Not on file    Allergies:  Allergies  Allergen Reactions   Abilify [Aripiprazole] Hives    Metabolic Disorder Labs: Lab Results  Component Value Date   HGBA1C 6.6 (H) 09/07/2019   Lab Results  Component Value Date   PROLACTIN 84.8 (H) 06/01/2017   PROLACTIN 36.2 (H) 03/26/2016   Lab Results  Component Value Date   CHOL 138 09/07/2019   TRIG 202 (H) 09/07/2019   HDL 55 09/07/2019   CHOLHDL 2.9 09/26/2015   VLDL 51 (H) 04/23/2017   LDLCALC 51 09/07/2019   LDLCALC 63 04/28/2018   Lab Results  Component Value Date   TSH 1.290 04/28/2018   TSH 2.570 06/01/2017    Therapeutic Level Labs: No results found for: LITHIUM Lab Results  Component Value Date   VALPROATE 52 10/22/2017   VALPROATE 43 (L) 06/01/2017   No components found for:  CBMZ  Current Medications: Current Outpatient Medications  Medication Sig Dispense  Refill   [START ON 12/10/2020] Paliperidone ER (INVEGA SUSTENNA) injection Inject 78 mg into the muscle once for 1 dose. 0.5 mL 0   ACCU-CHEK GUIDE test strip USE TO CHECK BLOOD SUGAR TWICE DAILY 100 strip 2   albuterol (VENTOLIN HFA) 108 (90 Base) MCG/ACT inhaler INHALE 2 PUFFS INTO THE LUNGS EVERY 6 HOURS AS NEEDED FOR WHEEZING OR SHORTNESS OF BREATH 25.5 g 3   amantadine (SYMMETREL) 100 MG capsule Take 1 capsule (100 mg total) by mouth 2 (two) times daily. 180 capsule 0   amoxicillin-clavulanate (AUGMENTIN) 875-125 MG tablet  Take 1 tablet by mouth 2 (two) times daily. 20 tablet 0   atorvastatin (LIPITOR) 20 MG tablet TAKE 1 TABLET(20 MG) BY MOUTH AT BEDTIME 90 tablet 0   doxycycline (VIBRAMYCIN) 50 MG capsule TAKE 1 CAPSULE(50 MG) BY MOUTH EVERY MORNING 90 capsule 0   fluticasone (FLOVENT HFA) 110 MCG/ACT inhaler Inhale 1 puff into the lungs 2 (two) times daily. 1 Inhaler 11   gabapentin (NEURONTIN) 600 MG tablet Take 1 tablet (600 mg total) by mouth 3 (three) times daily. 270 tablet 1   hydrochlorothiazide (HYDRODIURIL) 12.5 MG tablet TAKE 1 TABLET(12.5 MG) BY MOUTH DAILY 90 tablet 0   ibuprofen (ADVIL) 200 MG tablet Take 200 mg by mouth every 6 (six) hours as needed.     insulin degludec (TRESIBA FLEXTOUCH) 100 UNIT/ML FlexTouch Pen INJECT 0.4 MLS(40 UNITS TOTAL) UNDER THE SKIN DAILY 15 mL 5   Insulin Pen Needle (BD PEN NEEDLE NANO U/F) 32G X 4 MM MISC 1 each by Does not apply route daily. 100 each 12   MELATONIN PO Take 30 mg by mouth. Taking 1 tablet daily at night     meloxicam (MOBIC) 15 MG tablet TAKE 1 TABLET(15 MG) BY MOUTH DAILY 30 tablet 0   metFORMIN (GLUCOPHAGE) 500 MG tablet TAKE 2 TABLETS(1000 MG) BY MOUTH TWICE DAILY WITH A MEAL 360 tablet 0   NOVOTWIST 32G X 5 MM MISC USE WITH LEVEMIR PEN BID  5   omeprazole (PRILOSEC) 10 MG capsule TAKE 1 CAPSULE BY MOUTH DAILY 90 capsule 0   paliperidone (INVEGA SUSTENNA) 234 MG/1.5ML SUSY injection Inject 234 mg into the muscle once for 1  dose. 1.5 mL 11   perphenazine (TRILAFON) 4 MG tablet Take 1 tablet (4 mg total) by mouth at bedtime. 90 tablet 0   phenazopyridine (PYRIDIUM) 200 MG tablet Take 1 tablet (200 mg total) by mouth 3 (three) times daily as needed for pain. 10 tablet 0   pioglitazone (ACTOS) 45 MG tablet TAKE 1 TABLET(45 MG) BY MOUTH DAILY 90 tablet 1   Pseudoephedrine-APAP-DM (DAYQUIL PO) Take by mouth.     Semaglutide,0.25 or 0.5MG /DOS, (OZEMPIC, 0.25 OR 0.5 MG/DOSE,) 2 MG/1.5ML SOPN Inject 0.375 mLs (0.5 mg total) into the skin once a week. 1 pen 5   SUPER B COMPLEX/C PO Take by mouth.     Vilazodone HCl (VIIBRYD) 40 MG TABS Take 1 tablet (40 mg total) by mouth daily. 90 tablet 1   Current Facility-Administered Medications  Medication Dose Route Frequency Provider Last Rate Last Admin   paliperidone (INVEGA SUSTENNA) injection 234 mg  234 mg Intramuscular Q30 days Zena Amos, MD   234 mg at 12/04/20 1359     Musculoskeletal: Strength & Muscle Tone:  N/A Gait & Station:  N/A Patient leans: N/A  Psychiatric Specialty Exam: Review of Systems  Psychiatric/Behavioral:  Positive for dysphoric mood, hallucinations and sleep disturbance. Negative for agitation, behavioral problems, confusion, decreased concentration, self-injury and suicidal ideas. The patient is nervous/anxious. The patient is not hyperactive.   All other systems reviewed and are negative.  There were no vitals taken for this visit.There is no height or weight on file to calculate BMI.  General Appearance: Fairly Groomed  Eye Contact:  Good  Speech:  Clear and Coherent  Volume:  Normal  Mood:  Depressed  Affect:  Appropriate, Congruent, and slightly down  Thought Process:  Coherent  Orientation:  Full (Time, Place, and Person)  Thought Content: Logical   Suicidal Thoughts:  No  Homicidal Thoughts:  No  Memory:  Immediate;   Good  Judgement:  Good  Insight:  Good  Psychomotor Activity:  Normal  Concentration:  Concentration: Good  and Attention Span: Good  Recall:  Good  Fund of Knowledge: Good  Language: Good  Akathisia:  No  Handed:  Right  AIMS (if indicated): not done  Assets:  Communication Skills Desire for Improvement  ADL's:  Intact  Cognition: WNL  Sleep:  Poor   Screenings: AIMS    Flowsheet Row Office Visit from 05/12/2017 in [facility] Office Visit from 07/29/2016 in [facility] Office Visit from 05/06/2016 in [facility] Office Visit from 04/01/2016 in [facility] Office Visit from 03/28/2015 in [facility]  AIMS Total Score 0 0 0 0 0      GAD-7    Flowsheet Row Office Visit from 04/28/2018 in [facility]  Total GAD-7 Score 8      Mini-Mental    Flowsheet Row Office Visit from 09/07/2019 in [facility]  Total Score (max 30 points ) 16      PHQ2-9    Flowsheet Row Clinical Support from 09/03/2020 in [facility] Clinical Support from 01/19/2019 in [facility] Office Visit from 04/28/2018 in [facility] Clinical Support from 01/13/2018 in [facility] Clinical Support from 01/13/2017 in [facility]  PHQ-2 Total Score 2 0 4 2 2   PHQ-9 Total Score 15 -- 11 8 12       Flowsheet Row Video Visit from 10/23/2020 in [facility]  C-SSRS RISK CATEGORY No Risk        Assessment and Plan:  Tabitha [name] is a 50 y.o. year old female with a history of schizoaffective disorder,  sleep apnea (CPAP machine), diabetes, hypertension, hyperlipidemia, GERD, who presents for follow up appointment for below.    1. Schizoaffective disorder, depressive type (HCC) 2. Obsessive-compulsive disorder, unspecified type 3. Anxiety state There has been worsening in paranoia and hallucinations in the context of missed previous paliperidone IM due to having  COVID.  Other psychosocial stressors includes upcoming court about the case of her husband, although she declines to elaborate it due to legal being involved.  We will plan to have another IM Invega given she missed the dose for more than a month.  Will continue oral perphenazine to target schizoaffective disorder.  Will continue Viibryd to target depression and anxiety.  Will continue gabapentin for anxiety.  Will continue amantadine at this time; noted that it is unclear whether she has been on this medication for tardive dyskinesia.  Will consider tapering it down at the next visit.   # Imbalance She reports imbalance for the past few weeks.  She is advised to contact her PCP for further evaluation.   This clinician has discussed the side effect associated with medication prescribed during this encounter. Please refer to notes in the previous encounters for more details.    Plan 1 Give another 78 IM paliperidone IM a week after the last IM (234 mg IM), followed by monthly  invega 234 mg IM - monitor weight gain. She is on metformin.  2. Continue perphenazine 4 mg daily 3. Continue viibryd 40 mg 4. Continue amantadine 100 mg twice a day 5. Continue gabapentin 600 mg three times a day 6. Next appointment: 9/1 at 10 AM for 30 mins, video and 10/10 at 10 AM for 30 mins, in person -  She will [facility]   The patient demonstrates the following risk factors for suicide: Chronic risk factors for suicide include: psychiatric disorder of schizoaffective disorder. Acute risk factors for suicide include: unemployment. Protective factors for this patient include: positive social support, responsibility to others (children, family), hope for the future and religious beliefs against suicide. Considering these factors, the overall suicide risk at this point appears to be low. Patient is appropriate for outpatient follow up.  Although she has guns at home, she does not have access to them.              [name], MD 12/05/2020, 4:08 PM

## 2020-12-04 NOTE — Patient Instructions (Signed)
Patient given injection in right upper outer quadrant.  Patient reports that she is doing good.  pt states she missed her last injection because her and her family had covid.  Pt states that she and her husband still doesn't feel good but that she was seeing things and her mood was getting worse so she came into office to get injection done. Patient tolerated injection well.  Will return in 28 days . Pt was given an appt to see Dr. Vanetta Shawl for tomorrow since she not doing well.     invega sustenna 234mg / 1.86ml ndc # 4m s/n [id number],  Exp 03-27-22,  Lot 14-01-23

## 2020-12-05 ENCOUNTER — Telehealth (INDEPENDENT_AMBULATORY_CARE_PROVIDER_SITE_OTHER): Payer: Medicare Other | Admitting: Psychiatry

## 2020-12-05 ENCOUNTER — Encounter: Payer: Self-pay | Admitting: Psychiatry

## 2020-12-05 ENCOUNTER — Telehealth: Payer: Self-pay

## 2020-12-05 DIAGNOSIS — F411 Generalized anxiety disorder: Secondary | ICD-10-CM

## 2020-12-05 DIAGNOSIS — F429 Obsessive-compulsive disorder, unspecified: Secondary | ICD-10-CM | POA: Diagnosis not present

## 2020-12-05 DIAGNOSIS — F251 Schizoaffective disorder, depressive type: Secondary | ICD-10-CM | POA: Diagnosis not present

## 2020-12-05 MED ORDER — INVEGA SUSTENNA 78 MG/0.5ML IM SUSY: 78.0000 mg | PREFILLED_SYRINGE | Freq: Once | INTRAMUSCULAR | 0 refills | Status: DC

## 2020-12-05 NOTE — Patient Instructions (Addendum)
1 Have another 78 IM palpiperidone IM a week after the last IM (234 mg IM), followed by monthly  invega 234 mg IM 2. Continue perphenazine 4 mg daily 3. Continue viibryd 40 mg 4. Continue amantadine 100 mg twice a day 5. Continue gabapentin 600 mg three times a day 6. Next appointment: 9/1 at 10 AM, video

## 2020-12-05 NOTE — Telephone Encounter (Signed)
received fax requesting a refill on the perphenazine 8mg 

## 2020-12-05 NOTE — Telephone Encounter (Signed)
Decline- that dose was discontinued, and she should have enough 4 mg.

## 2020-12-05 NOTE — Telephone Encounter (Signed)
Pharmacy notified by fax.

## 2020-12-06 ENCOUNTER — Ambulatory Visit: Payer: Medicare Other

## 2020-12-09 ENCOUNTER — Other Ambulatory Visit: Payer: Self-pay | Admitting: Psychiatry

## 2020-12-09 MED ORDER — PALIPERIDONE PALMITATE ER 78 MG/0.5ML IM SUSY
78.0000 mg | PREFILLED_SYRINGE | Freq: Once | INTRAMUSCULAR | Status: AC
  Administered 2020-12-17: 78 mg via INTRAMUSCULAR

## 2020-12-12 ENCOUNTER — Telehealth: Payer: Medicare Other | Admitting: Psychiatry

## 2020-12-12 ENCOUNTER — Other Ambulatory Visit: Payer: Self-pay

## 2020-12-17 ENCOUNTER — Other Ambulatory Visit: Payer: Self-pay

## 2020-12-17 ENCOUNTER — Ambulatory Visit (INDEPENDENT_AMBULATORY_CARE_PROVIDER_SITE_OTHER): Payer: Medicare Other

## 2020-12-17 ENCOUNTER — Ambulatory Visit: Payer: Medicare Other

## 2020-12-17 DIAGNOSIS — F251 Schizoaffective disorder, depressive type: Secondary | ICD-10-CM | POA: Diagnosis not present

## 2020-12-17 DIAGNOSIS — F3170 Bipolar disorder, currently in remission, most recent episode unspecified: Secondary | ICD-10-CM | POA: Diagnosis not present

## 2020-12-17 NOTE — Patient Instructions (Signed)
Patient given injection in left upper outer quadrant .invega sustenna 78mg  NDC exp 04-23. S/N # [id number]  pt states she doing much better than last time.

## 2020-12-23 ENCOUNTER — Other Ambulatory Visit: Payer: Self-pay | Admitting: Internal Medicine

## 2020-12-23 ENCOUNTER — Telehealth: Payer: Self-pay | Admitting: Family Medicine

## 2020-12-23 DIAGNOSIS — L719 Rosacea, unspecified: Secondary | ICD-10-CM

## 2020-12-23 NOTE — Telephone Encounter (Signed)
Copied from CRM [id number]. Topic: Quick Communication - Rx Refill/Question >> Dec 23, 2020  8:36 AM Gaetana Michaelis A wrote: Medication: doxycycline (VIBRAMYCIN) 50 MG capsule [[id number]]   Has the patient contacted their pharmacy? Yes.   (Agent: If no, request that the patient contact the pharmacy for the refill.) (Agent: If yes, when and what did the pharmacy advise?)  Preferred Pharmacy (with phone number or street name): [id number] [place], [place] - [address] AT [facility] [address] [facility]  Phone:  [phone] Fax:  [phone]   Agent: Please be advised that RX refills may take up to 3 business days. We ask that you follow-up with your pharmacy.

## 2020-12-23 NOTE — Telephone Encounter (Signed)
Requested medication (s) are due for refill today: yes  Requested medication (s) are on the active medication list: yes  Last refill:  07/25/20 #90 0 refills  Future visit scheduled: yes  Notes to clinic:  medication not assigned to a protocol     Requested Prescriptions  Pending Prescriptions Disp Refills   doxycycline (VIBRAMYCIN) 50 MG capsule [Pharmacy Med Name: DOXYCYCLINE HYC 50MG  CAPS] 90 capsule 0    Sig: TAKE 1 CAPSULE(50 MG) BY MOUTH EVERY MORNING     Off-Protocol Failed - 12/23/2020  8:30 AM      Failed - Medication not assigned to a protocol, review manually.      Passed - Valid encounter within last 12 months    Recent Outpatient Visits           3 weeks ago COVID-19   [facility], [place], NP   8 months ago Encounter to establish care with new doctor   [facility], FNP   1 year ago Acute lower UTI   [facility] [place], [place]   1 year ago Type 2 diabetes mellitus with hyperglycemia, with long-term current use of insulin [facility])   [facility], [place]   1 year ago Rash   [facility] [place], NP       Future Appointments             In 8 months [facility], [facility]

## 2020-12-25 NOTE — Telephone Encounter (Signed)
I called the patient and left a detail message on her voicemail to give to [place] a call and schedule an appointment to get refill on her doxycycline.

## 2020-12-25 NOTE — Progress Notes (Signed)
BH MD/PA/NP OP Progress Note  12/26/2020 10:36 AM Tabitha [name]  MRN:  [id number]  Chief Complaint:  Chief Complaint   Follow-up; Other    HPI:  This is a follow-up appointment for schizoaffective disorder.  She states that she has been doing a little better.  She has not had any paranoia against her family anymore.  She enjoys being with her sons.  She occasionally feels that people are talking about her when she goes outside/go to church.  However, she is able to join choir, and enjoys doing it.  Her husband had a court, and he will have another court in October.  She feels that his lawyer is not helping him, although he is innocent.  She feels anxious about this.  Although she occasionally feels down, she usually feels better afterwards.  She has occasional insomnia.  She uses CPAP machine regularly.  She feels fatigued at times.  She has occasional difficulty in concentration.  She denies SI.  She has AH of kids calling her.  She denies VH.  She denies thought insertion.  She has occasional ideas of reference.  She states that the paranoia are started when she was working.  She could not keep her job as she felt people were following after her.  These occurred in her 67s, and she denies any symptoms when she was in college.  She denies alcohol use or drug use.  She was able to get injection few weeks ago; she agrees to get back on monthly injection.   240 lbs Wt Readings from Last 3 Encounters:  12/17/20 238 lb 3.2 oz (108 kg)  12/04/20 237 lb (107.5 kg)  10/07/20 244 lb 0.2 oz (110.7 kg)     Daily routine: house chores, goes to church weekly. Choir on Wed,  Exercise: Employment: unemployed, on disability due to paranoia (could not keep her job due to paranoia that people coming to her workplace). used to work at [employer] until 2007 (quit due to pregnancy) Support: husband, son, parents Household: husband, 2 sons (age 55,15) Marital status: married Number of children: 2 sons Education:  college, majored in Psychologist, educational, music  Visit Diagnosis:    ICD-10-CM   1. Schizoaffective disorder, depressive type (HCC)  F25.1     2. Obsessive-compulsive disorder, unspecified type  F42.9     3. Anxiety state  F41.1       Past Psychiatric History: Please see initial evaluation for full details. I have reviewed the history. No updates at this time.     Past Medical History:  Past Medical History:  Diagnosis Date   Allergic rhinitis    Anxiety    Asthma    Bipolar disorder (HCC)    Depression    Diabetes mellitus without complication (HCC)    GERD (gastroesophageal reflux disease)    Hyperlipidemia    Hypertension    Mild sleep apnea    OCD (obsessive compulsive disorder)    Palpitations    Paranoia (HCC)    Schizoaffective disorder (HCC)    Sleep apnea     Past Surgical History:  Procedure Laterality Date   CESAREAN SECTION  2006/2008   CHOLECYSTECTOMY  2006    Family Psychiatric History: Please see initial evaluation for full details. I have reviewed the history. No updates at this time.     Family History:  Family History  Problem Relation Age of Onset   Lupus Mother    Diabetes Mother        pre  diabetes   Diabetes Father    Hyperlipidemia Father    Diabetes Brother    Depression Paternal Grandmother    Bipolar disorder Paternal Grandmother     Social History:  Social History   Socioeconomic History   Marital status: Married    Spouse name: kevin   Number of children: 2   Years of education: Not on file   Highest education level: Bachelor's degree (e.g., BA, AB, BS)  Occupational History   Occupation: disability  Tobacco Use   Smoking status: Never   Smokeless tobacco: Never  Vaping Use   Vaping Use: Never used  Substance and Sexual Activity   Alcohol use: No    Alcohol/week: 0.0 standard drinks   Drug use: No   Sexual activity: Yes    Partners: Male    Birth control/protection: None, Surgical  Other Topics Concern   Not on file  Social  History Narrative   Not on file   Social Determinants of Health   Financial Resource Strain: Low Risk    Difficulty of Paying Living Expenses: Not hard at all  Food Insecurity: No Food Insecurity   Worried About Programme researcher, broadcasting/film/videounning Out of Food in the Last Year: Never true   Ran Out of Food in the Last Year: Never true  Transportation Needs: No Transportation Needs   Lack of Transportation (Medical): No   Lack of Transportation (Non-Medical): No  Physical Activity: Inactive   Days of Exercise per Week: 0 days   Minutes of Exercise per Session: 0 min  Stress: Stress Concern Present   Feeling of Stress : To some extent  Social Connections: Not on file    Allergies:  Allergies  Allergen Reactions   Abilify [Aripiprazole] Hives    Metabolic Disorder Labs: Lab Results  Component Value Date   HGBA1C 6.6 (H) 09/07/2019   Lab Results  Component Value Date   PROLACTIN 84.8 (H) 06/01/2017   PROLACTIN 36.2 (H) 03/26/2016   Lab Results  Component Value Date   CHOL 138 09/07/2019   TRIG 202 (H) 09/07/2019   HDL 55 09/07/2019   CHOLHDL 2.9 09/26/2015   VLDL 51 (H) 04/23/2017   LDLCALC 51 09/07/2019   LDLCALC 63 04/28/2018   Lab Results  Component Value Date   TSH 1.290 04/28/2018   TSH 2.570 06/01/2017    Therapeutic Level Labs: No results found for: LITHIUM Lab Results  Component Value Date   VALPROATE 52 10/22/2017   VALPROATE 43 (L) 06/01/2017   No components found for:  CBMZ  Current Medications: Current Outpatient Medications  Medication Sig Dispense Refill   Amantadine HCl 100 MG tablet Take 0.5 tablets (50 mg total) by mouth 2 (two) times daily. 30 tablet 1   ACCU-CHEK GUIDE test strip USE TO CHECK BLOOD SUGAR TWICE DAILY 100 strip 2   albuterol (VENTOLIN HFA) 108 (90 Base) MCG/ACT inhaler INHALE 2 PUFFS INTO THE LUNGS EVERY 6 HOURS AS NEEDED FOR WHEEZING OR SHORTNESS OF BREATH 25.5 g 3   amantadine (SYMMETREL) 100 MG capsule Take 1 capsule (100 mg total) by mouth 2  (two) times daily. 180 capsule 0   amoxicillin-clavulanate (AUGMENTIN) 875-125 MG tablet Take 1 tablet by mouth 2 (two) times daily. 20 tablet 0   atorvastatin (LIPITOR) 20 MG tablet TAKE 1 TABLET(20 MG) BY MOUTH AT BEDTIME 90 tablet 0   doxycycline (VIBRAMYCIN) 50 MG capsule TAKE 1 CAPSULE(50 MG) BY MOUTH EVERY MORNING 90 capsule 0   fluticasone (FLOVENT HFA) 110 MCG/ACT inhaler Inhale  1 puff into the lungs 2 (two) times daily. 1 Inhaler 11   gabapentin (NEURONTIN) 600 MG tablet Take 1 tablet (600 mg total) by mouth 3 (three) times daily. 270 tablet 1   hydrochlorothiazide (HYDRODIURIL) 12.5 MG tablet TAKE 1 TABLET(12.5 MG) BY MOUTH DAILY 90 tablet 0   ibuprofen (ADVIL) 200 MG tablet Take 200 mg by mouth every 6 (six) hours as needed.     insulin degludec (TRESIBA FLEXTOUCH) 100 UNIT/ML FlexTouch Pen INJECT 0.4 MLS(40 UNITS TOTAL) UNDER THE SKIN DAILY 15 mL 5   Insulin Pen Needle (BD PEN NEEDLE NANO U/F) 32G X 4 MM MISC 1 each by Does not apply route daily. 100 each 12   MELATONIN PO Take 30 mg by mouth. Taking 1 tablet daily at night     meloxicam (MOBIC) 15 MG tablet TAKE 1 TABLET(15 MG) BY MOUTH DAILY 30 tablet 0   metFORMIN (GLUCOPHAGE) 500 MG tablet TAKE 2 TABLETS(1000 MG) BY MOUTH TWICE DAILY WITH A MEAL 360 tablet 0   NOVOTWIST 32G X 5 MM MISC USE WITH LEVEMIR PEN BID  5   omeprazole (PRILOSEC) 10 MG capsule TAKE 1 CAPSULE BY MOUTH DAILY 90 capsule 0   paliperidone (INVEGA SUSTENNA) 234 MG/1.5ML SUSY injection Inject 234 mg into the muscle every 28 (twenty-eight) days. 1.5 mL 11   Paliperidone ER (INVEGA SUSTENNA) injection Inject 78 mg into the muscle once for 1 dose. 0.5 mL 0   [START ON 01/22/2021] perphenazine (TRILAFON) 4 MG tablet Take 1 tablet (4 mg total) by mouth at bedtime. 90 tablet 0   phenazopyridine (PYRIDIUM) 200 MG tablet Take 1 tablet (200 mg total) by mouth 3 (three) times daily as needed for pain. 10 tablet 0   pioglitazone (ACTOS) 45 MG tablet TAKE 1 TABLET(45 MG) BY  MOUTH DAILY 90 tablet 1   Pseudoephedrine-APAP-DM (DAYQUIL PO) Take by mouth.     Semaglutide,0.25 or 0.5MG /DOS, (OZEMPIC, 0.25 OR 0.5 MG/DOSE,) 2 MG/1.5ML SOPN Inject 0.375 mLs (0.5 mg total) into the skin once a week. 1 pen 5   SUPER B COMPLEX/C PO Take by mouth.     Vilazodone HCl (VIIBRYD) 40 MG TABS Take 1 tablet (40 mg total) by mouth daily. 90 tablet 1   Current Facility-Administered Medications  Medication Dose Route Frequency Provider Last Rate Last Admin   paliperidone (INVEGA SUSTENNA) injection 234 mg  234 mg Intramuscular Q30 days Zena Amos, MD   234 mg at 12/04/20 1359     Musculoskeletal: Strength & Muscle Tone:  N/A Gait & Station:  N/A Patient leans: N/A  Psychiatric Specialty Exam: Review of Systems  Psychiatric/Behavioral:  Positive for decreased concentration, dysphoric mood and sleep disturbance. Negative for agitation, behavioral problems, confusion, hallucinations, self-injury and suicidal ideas. The patient is nervous/anxious. The patient is not hyperactive.   All other systems reviewed and are negative.  There were no vitals taken for this visit.There is no height or weight on file to calculate BMI.  General Appearance: Fairly Groomed  Eye Contact:  Good  Speech:  Clear and Coherent  Volume:  Normal  Mood:   fine  Affect:  Appropriate, Congruent, and calm, down at times  Thought Process:  Coherent  Orientation:  Full (Time, Place, and Person)  Thought Content: Logical   Suicidal Thoughts:  No  Homicidal Thoughts:  No  Memory:  Immediate;   Good  Judgement:  Good  Insight:  Fair  Psychomotor Activity:  Normal  Concentration:  Concentration: Good and Attention Span: Good  Recall:  Good  Fund of Knowledge: Good  Language: Good  Akathisia:  No  Handed:  Right  AIMS (if indicated): not done  Assets:  Communication Skills Desire for Improvement  ADL's:  Intact  Cognition: WNL  Sleep:  Fair   Screenings: AIMS    Flowsheet Row Office Visit  from 05/12/2017 in [facility] Office Visit from 07/29/2016 in [facility] Office Visit from 05/06/2016 in [facility] Office Visit from 04/01/2016 in [facility] Office Visit from 03/28/2015 in [facility]  AIMS Total Score 0 0 0 0 0      GAD-7    Flowsheet Row Office Visit from 04/28/2018 in [facility]  Total GAD-7 Score 8      Mini-Mental    Flowsheet Row Office Visit from 09/07/2019 in [facility]  Total Score (max 30 points ) 16      PHQ2-9    Flowsheet Row Video Visit from 12/26/2020 in [facility] Clinical Support from 09/03/2020 in [facility] Clinical Support from 01/19/2019 in [facility] Office Visit from 04/28/2018 in [facility] Clinical Support from 01/13/2018 in [facility]  PHQ-2 Total Score 1 2 0 4 2  PHQ-9 Total Score -- 15 -- 11 8      Flowsheet Row Video Visit from 12/26/2020 in [facility] Video Visit from 12/05/2020 in [facility] Video Visit from 10/23/2020 in [facility]  C-SSRS RISK CATEGORY No Risk No Risk No Risk        Assessment and Plan:  TAETUM FLEWELLEN is a 50 y.o. year old female with a history of schizoaffective disorder,  sleep apnea (using CPAP machine), diabetes, hypertension, hyperlipidemia, GERD, who presents for follow up appointment for below.   1. Schizoaffective disorder, depressive type (HCC) 2. Obsessive-compulsive disorder, unspecified type 3. Anxiety state There has been overall improvement in paranoia and hallucinations since getting back on the injection regularly.  Psychosocial stressors includes upcoming court about the case of her husband, although she declined to elaborate in details due to legal been  involved.  Will continue monthly IM Invega.  Will continue oral perphenazine to target schizoaffective disorder.  Will continue Viibryd to target depression and anxiety.  Will continue gabapentin for anxiety.   # r.o tardive dyskinesia Amantadine has been prescribed by her previous provider.  It is unclear whether it was indicated for tardive dyskinesia; she denies any concern about her movement on exams.  Will taper down amantadine to avoid polypharmacy.  Will plan to do in person visit in the near future when her schedule allows.  This clinician has discussed the side effect associated with medication prescribed during this encounter. Please refer to notes in the previous encounters for more details.     Plan 1  Continue monthly invega 234 mg IM - monitor weight gain. She is on metformin.  2. Continue perphenazine 4 mg daily 3. Continue viibryd 40 mg 4.  Decrease amantadine 50 mg twice a day 5. Continue gabapentin 600 mg three times a day 6. Next appointment: 10/11 at 3 PM for 30 mins, video - She will see [facility] P[facility] at [facility]  Past trials of medication: Abilify (hives), olanzapine, quetiapine, risperidone (stopped working),    The patient demonstrates the following risk factors for suicide: Chronic risk factors for suicide include: psychiatric disorder of schizoaffective disorder. Acute risk factors  for suicide include: unemployment. Protective factors for this patient include: positive social support, responsibility to others (children, family), hope for the future and religious beliefs against suicide. Considering these factors, the overall suicide risk at this point appears to be low. Patient is appropriate for outpatient follow up.  Although she has guns at home, she does not have access to them.             [name], MD 12/26/2020, 10:36 AM

## 2020-12-26 ENCOUNTER — Other Ambulatory Visit: Payer: Self-pay

## 2020-12-26 ENCOUNTER — Telehealth: Payer: Self-pay | Admitting: Psychiatry

## 2020-12-26 ENCOUNTER — Telehealth (INDEPENDENT_AMBULATORY_CARE_PROVIDER_SITE_OTHER): Payer: Medicare Other | Admitting: Psychiatry

## 2020-12-26 ENCOUNTER — Telehealth: Payer: Self-pay

## 2020-12-26 ENCOUNTER — Encounter: Payer: Self-pay | Admitting: Psychiatry

## 2020-12-26 DIAGNOSIS — F411 Generalized anxiety disorder: Secondary | ICD-10-CM | POA: Diagnosis not present

## 2020-12-26 DIAGNOSIS — F429 Obsessive-compulsive disorder, unspecified: Secondary | ICD-10-CM | POA: Diagnosis not present

## 2020-12-26 DIAGNOSIS — F251 Schizoaffective disorder, depressive type: Secondary | ICD-10-CM

## 2020-12-26 MED ORDER — INVEGA SUSTENNA 234 MG/1.5ML IM SUSY: 234.0000 mg | PREFILLED_SYRINGE | INTRAMUSCULAR | 11 refills | Status: DC

## 2020-12-26 MED ORDER — PERPHENAZINE 4 MG PO TABS: 4.0000 mg | ORAL_TABLET | Freq: Every day | ORAL | 0 refills | Status: DC

## 2020-12-26 MED ORDER — AMANTADINE HCL 100 MG PO TABS: 50.0000 mg | ORAL_TABLET | Freq: Two times a day (BID) | ORAL | 1 refills | Status: DC

## 2020-12-26 NOTE — Telephone Encounter (Signed)
Could you contact the patient and reschedule her injection appt (currently scheduled on 9/8). She had injection on 8/23. Next injection will be around 28 days after 8/23. 234 mg IM. Thanks.

## 2020-12-26 NOTE — Telephone Encounter (Signed)
Medication refill request - Request from patients pharmacy for a 90 day supply of her prescribed Amantadine and per patient request as well.

## 2020-12-26 NOTE — Patient Instructions (Signed)
1  Continue monthly invega 234 mg IM  2. Continue perphenazine 4 mg daily 3. Continue viibryd 40 mg 4.  Decrease amantadine 50 mg twice a day 5. Continue gabapentin 600 mg three times a day 6. Next appointment: 10/11 at 3 PM

## 2020-12-26 NOTE — Telephone Encounter (Signed)
Appointment - Telephone message left for patient to inform of her need to change next appointment for due Monthly Invega injection due to information from Dr. Vanetta Shawl pt's last injection was 12/17/20 and would now be due 01/14/21.  Requested patient call our office back to confirm she had received this message and to set up a time on 01/14/21 to come in for her next due Invega Maintenna injection that would now be due that date.

## 2020-12-27 NOTE — Telephone Encounter (Signed)
Could you contact and discuss with the patient. This medication will likely be discontinued in the near future unless she experiences any worsening in symptoms. Does she still would like to have a 90 day supply?

## 2020-12-27 NOTE — Telephone Encounter (Signed)
Medication management - Left message to inform pt of received 90 day refill order for her Amantadine by her pharmacy but also that Dr. Vanetta Shawl does not think she will be taking this that long so if patient does not agree or has problems with this to call our office back to discuss further with provider the request.

## 2021-01-02 ENCOUNTER — Other Ambulatory Visit: Payer: Self-pay

## 2021-01-02 ENCOUNTER — Ambulatory Visit (INDEPENDENT_AMBULATORY_CARE_PROVIDER_SITE_OTHER): Payer: Medicare Other

## 2021-01-02 DIAGNOSIS — F429 Obsessive-compulsive disorder, unspecified: Secondary | ICD-10-CM

## 2021-01-02 DIAGNOSIS — F251 Schizoaffective disorder, depressive type: Secondary | ICD-10-CM

## 2021-01-02 DIAGNOSIS — F3170 Bipolar disorder, currently in remission, most recent episode unspecified: Secondary | ICD-10-CM

## 2021-01-02 NOTE — Patient Instructions (Addendum)
Pt to return to office in approximately 28 days for next injection.

## 2021-01-02 NOTE — Progress Notes (Signed)
pt was given invega sustenna 234mg /1.4ml in the upper rt out quatient.  pt states that she doing better than she was last time she was in.  Pt was talkative she talked about her children going back to school and that she did notice improvement since her last injection.    s/n # 4m expt 3-24  lot # [id number] NDC# [id number].  Pt to return in approximately 28 days for next injection

## 2021-01-05 ENCOUNTER — Other Ambulatory Visit: Payer: Self-pay | Admitting: Family Medicine

## 2021-01-05 DIAGNOSIS — E119 Type 2 diabetes mellitus without complications: Secondary | ICD-10-CM

## 2021-01-05 DIAGNOSIS — K219 Gastro-esophageal reflux disease without esophagitis: Secondary | ICD-10-CM

## 2021-01-05 DIAGNOSIS — Z794 Long term (current) use of insulin: Secondary | ICD-10-CM

## 2021-01-05 NOTE — Telephone Encounter (Signed)
Requested Prescriptions  Pending Prescriptions Disp Refills  . ACCU-CHEK GUIDE test strip [Pharmacy Med Name: ACCU-CHEK GUIDE TEST STRIPS 100S] 100 strip 2    Sig: USE TO CHECK BLOOD SUGAR TWICE DAILY     Endocrinology: Diabetes - Testing Supplies Passed - 01/05/2021  2:37 PM      Passed - Valid encounter within last 12 months    Recent Outpatient Visits          1 month ago COVID-19   [facility] [place], Salvadore Oxford, NP   8 months ago Encounter to establish care with new doctor   [facility], Jodelle Gross, FNP   1 year ago Acute lower UTI   [facility], Salley Hews, [place]   1 year ago Type 2 diabetes mellitus with hyperglycemia, with long-term current use of insulin [facility])   [facility] Particia Nearing, [place]   1 year ago Rash   [facility] Marjie Skiff, NP      Future Appointments            In 2 days Baity, Salvadore Oxford, NP [facility], [facility]   In 8 months  [facility], [facility]

## 2021-01-05 NOTE — Telephone Encounter (Signed)
Requested medication (s) are due for refill today: yes  Requested medication (s) are on the active medication list: yes  Last refill:  10/11/20 #360  Future visit scheduled: no  Notes to clinic:  overdue labs last Hgb A1C 09/07/19   Requested Prescriptions  Pending Prescriptions Disp Refills   metFORMIN (GLUCOPHAGE) 500 MG tablet [Pharmacy Med Name: METFORMIN 500MG TABLETS] 360 tablet     Sig: TAKE 2 TABLETS(1000 MG) BY MOUTH TWICE DAILY WITH A MEAL     Endocrinology:  Diabetes - Biguanides Failed - 01/05/2021  7:47 AM      Failed - Cr in normal range and within 360 days    Creat  Date Value Ref Range Status  09/26/2015 0.73 0.50 - 1.10 mg/dL Final   Creatinine, Ser  Date Value Ref Range Status  09/07/2019 0.90 0.57 - 1.00 mg/dL Final          Failed - HBA1C is between 0 and 7.9 and within 180 days    HB A1C (BAYER DCA - WAIVED)  Date Value Ref Range Status  10/22/2017 6.9 <7.0 % Final    Comment:                                          Diabetic Adult            <7.0                                       Healthy Adult        4.3 - 5.7                                                           (DCCT/NGSP) American Diabetes Association's Summary of Glycemic Recommendations for Adults with Diabetes: Hemoglobin A1c <7.0%. More stringent glycemic goals (A1c <6.0%) may further reduce complications at the cost of increased risk of hypoglycemia.    Hgb A1c MFr Bld  Date Value Ref Range Status  09/07/2019 6.6 (H) 4.8 - 5.6 % Final    Comment:             Prediabetes: 5.7 - 6.4          Diabetes: >6.4          Glycemic control for adults with diabetes: <7.0           Failed - eGFR in normal range and within 360 days    GFR calc Af Amer  Date Value Ref Range Status  09/07/2019 88 >59 mL/min/1.73 Final    Comment:    **[facility] currently reports eGFR in compliance with the current**   recommendations of the [employer]. [facility] will   update reporting as  new guidelines are published from the NKF-ASN   Task force.    GFR calc non Af Amer  Date Value Ref Range Status  09/07/2019 76 >59 mL/min/1.73 Final          Passed - Valid encounter within last 6 months    Recent Outpatient Visits           1 month ago COVID-19  [facility] [place], Coralie Keens, NP   8 months ago Encounter to establish care with new doctor   [facility], Lupita Raider, FNP   1 year ago Acute lower UTI   [facility] Merrie Roof [place], [place]   1 year ago Type 2 diabetes mellitus with hyperglycemia, with long-term current use of insulin [facility])   [facility] Volney American, [place]   1 year ago [place], Barbaraann Faster, NP       Future Appointments             In 2 days Baity, Coralie Keens, NP [facility], [place]   In 8 months  [facility], [place]            Signed Prescriptions Disp Refills   omeprazole (PRILOSEC) 10 MG capsule 90 capsule 0    Sig: TAKE 1 CAPSULE BY MOUTH DAILY     Gastroenterology: Proton Pump Inhibitors Passed - 01/05/2021  7:47 AM      Passed - Valid encounter within last 12 months    Recent Outpatient Visits           1 month ago [facility], Coralie Keens, NP   8 months ago Encounter to establish care with new doctor   [facility], Lupita Raider, FNP   1 year ago Acute lower UTI   [facility], Lilia Argue, [place]   1 year ago Type 2 diabetes mellitus with hyperglycemia, with long-term current use of insulin [facility])   [facility] Volney American, [place]   1 year ago [place], Barbaraann Faster, NP       Future Appointments             In 2 days Baity, Coralie Keens, NP [facility], [place]   In 8 months  [facility], [facility]

## 2021-01-05 NOTE — Telephone Encounter (Signed)
Requested Prescriptions  Pending Prescriptions Disp Refills  . metFORMIN (GLUCOPHAGE) 500 MG tablet [Pharmacy Med Name: METFORMIN 500MG TABLETS] 360 tablet     Sig: TAKE 2 TABLETS(1000 MG) BY MOUTH TWICE DAILY WITH A MEAL     Endocrinology:  Diabetes - Biguanides Failed - 01/05/2021  7:47 AM      Failed - Cr in normal range and within 360 days    Creat  Date Value Ref Range Status  09/26/2015 0.73 0.50 - 1.10 mg/dL Final   Creatinine, Ser  Date Value Ref Range Status  09/07/2019 0.90 0.57 - 1.00 mg/dL Final         Failed - HBA1C is between 0 and 7.9 and within 180 days    HB A1C (BAYER DCA - WAIVED)  Date Value Ref Range Status  10/22/2017 6.9 <7.0 % Final    Comment:                                          Diabetic Adult            <7.0                                       Healthy Adult        4.3 - 5.7                                                           (DCCT/NGSP) American Diabetes Association's Summary of Glycemic Recommendations for Adults with Diabetes: Hemoglobin A1c <7.0%. More stringent glycemic goals (A1c <6.0%) may further reduce complications at the cost of increased risk of hypoglycemia.    Hgb A1c MFr Bld  Date Value Ref Range Status  09/07/2019 6.6 (H) 4.8 - 5.6 % Final    Comment:             Prediabetes: 5.7 - 6.4          Diabetes: >6.4          Glycemic control for adults with diabetes: <7.0          Failed - eGFR in normal range and within 360 days    GFR calc Af Amer  Date Value Ref Range Status  09/07/2019 88 >59 mL/min/1.73 Final    Comment:    **[facility] currently reports eGFR in compliance with the current**   recommendations of the National Kidney Foundation. [facility] will   update reporting as new guidelines are published from the NKF-ASN   Task force.    GFR calc non Af Amer  Date Value Ref Range Status  09/07/2019 76 >59 mL/min/1.73 Final         Passed - Valid encounter within last 6 months    Recent Outpatient Visits           1 month ago COVID-19   [facility] Baity, Regina W, NP   8 months ago Encounter to establish care with new doctor   [facility] [place], Nicole M, FNP   1 year ago Acute lower UTI   [facility], Rachel Elizabeth, PA-C     1 year ago Type 2 diabetes mellitus with hyperglycemia, with long-term current use of insulin (HCC)   [facility], Rachel Elizabeth, PA-C   1 year ago Rash   [facility] [place], Jolene T, NP      Future Appointments            In 2 days Baity, Regina W, NP [facility]   In 8 months  [facility]           . omeprazole (PRILOSEC) 10 MG capsule [Pharmacy Med Name: OMEPRAZOLE 10MG CAPSULES] 90 capsule 0    Sig: TAKE 1 CAPSULE BY MOUTH DAILY     Gastroenterology: Proton Pump Inhibitors Passed - 01/05/2021  7:47 AM      Passed - Valid encounter within last 12 months    Recent Outpatient Visits          1 month ago COVID-19   [facility] Baity, Regina W, NP   8 months ago Encounter to establish care with new doctor   [facility] [place], Nicole M, FNP   1 year ago Acute lower UTI   [facility], Rachel Elizabeth, PA-C   1 year ago Type 2 diabetes mellitus with hyperglycemia, with long-term current use of insulin (HCC)   [facility], Rachel Elizabeth, PA-C   1 year ago Rash   [facility] [place], Jolene T, NP      Future Appointments            In 2 days Baity, Regina W, NP [facility]   In 8 months  [facility]             

## 2021-01-06 ENCOUNTER — Other Ambulatory Visit: Payer: Self-pay | Admitting: Family Medicine

## 2021-01-06 NOTE — Telephone Encounter (Signed)
Requested medication (s) are due for refill today: yes  Requested medication (s) are on the active medication list: yes  Last refill:  10/17/19  Future visit scheduled: yes  Notes to clinic:  overdue lab work   Requested Prescriptions  Pending Prescriptions Disp Refills   TRESIBA FLEXTOUCH 100 UNIT/ML FlexTouch Pen [Pharmacy Med Name: TRESIBA FLEXTOUCH PEN (U-100)INJ3ML] 15 mL 5    Sig: INJECT 0.4 MLS(40 UNITS TOTAL) UNDER THE SKIN DAILY     Endocrinology:  Diabetes - Insulins Failed - 01/06/2021  7:37 AM      Failed - HBA1C is between 0 and 7.9 and within 180 days    HB A1C (BAYER DCA - WAIVED)  Date Value Ref Range Status  10/22/2017 6.9 <7.0 % Final    Comment:                                          Diabetic Adult            <7.0                                       Healthy Adult        4.3 - 5.7                                                           (DCCT/NGSP) American Diabetes Association's Summary of Glycemic Recommendations for Adults with Diabetes: Hemoglobin A1c <7.0%. More stringent glycemic goals (A1c <6.0%) may further reduce complications at the cost of increased risk of hypoglycemia.    Hgb A1c MFr Bld  Date Value Ref Range Status  09/07/2019 6.6 (H) 4.8 - 5.6 % Final    Comment:             Prediabetes: 5.7 - 6.4          Diabetes: >6.4          Glycemic control for adults with diabetes: <7.0           Passed - Valid encounter within last 6 months    Recent Outpatient Visits           1 month ago COVID-19   [facility] [place], Salvadore Oxford, NP   8 months ago Encounter to establish care with new doctor   [facility], Jodelle Gross, FNP   1 year ago Acute lower UTI   [facility], Salley Hews, [place]   1 year ago Type 2 diabetes mellitus with hyperglycemia, with long-term current use of insulin [facility])   [facility], Salley Hews, [place]   1 year ago Rash   [facility] [place], Dorie Rank, NP       Future Appointments             Tomorrow Baity, Salvadore Oxford, NP [facility]   In 8 months  [facility], [facility]

## 2021-01-07 ENCOUNTER — Encounter: Payer: Self-pay | Admitting: Internal Medicine

## 2021-01-07 ENCOUNTER — Ambulatory Visit (INDEPENDENT_AMBULATORY_CARE_PROVIDER_SITE_OTHER): Payer: Medicare Other | Admitting: Internal Medicine

## 2021-01-07 ENCOUNTER — Other Ambulatory Visit: Payer: Self-pay

## 2021-01-07 VITALS — BP 129/57 | HR 81 | Temp 97.5°F | Resp 17 | Ht 62.0 in | Wt 240.6 lb

## 2021-01-07 DIAGNOSIS — K219 Gastro-esophageal reflux disease without esophagitis: Secondary | ICD-10-CM

## 2021-01-07 DIAGNOSIS — D508 Other iron deficiency anemias: Secondary | ICD-10-CM

## 2021-01-07 DIAGNOSIS — L719 Rosacea, unspecified: Secondary | ICD-10-CM | POA: Diagnosis not present

## 2021-01-07 DIAGNOSIS — E782 Mixed hyperlipidemia: Secondary | ICD-10-CM

## 2021-01-07 DIAGNOSIS — Z794 Long term (current) use of insulin: Secondary | ICD-10-CM

## 2021-01-07 DIAGNOSIS — E1142 Type 2 diabetes mellitus with diabetic polyneuropathy: Secondary | ICD-10-CM

## 2021-01-07 DIAGNOSIS — F251 Schizoaffective disorder, depressive type: Secondary | ICD-10-CM

## 2021-01-07 DIAGNOSIS — E7800 Pure hypercholesterolemia, unspecified: Secondary | ICD-10-CM

## 2021-01-07 DIAGNOSIS — K591 Functional diarrhea: Secondary | ICD-10-CM

## 2021-01-07 DIAGNOSIS — J4520 Mild intermittent asthma, uncomplicated: Secondary | ICD-10-CM | POA: Diagnosis not present

## 2021-01-07 DIAGNOSIS — E119 Type 2 diabetes mellitus without complications: Secondary | ICD-10-CM | POA: Diagnosis not present

## 2021-01-07 DIAGNOSIS — E1159 Type 2 diabetes mellitus with other circulatory complications: Secondary | ICD-10-CM | POA: Diagnosis not present

## 2021-01-07 DIAGNOSIS — F422 Mixed obsessional thoughts and acts: Secondary | ICD-10-CM

## 2021-01-07 DIAGNOSIS — I152 Hypertension secondary to endocrine disorders: Secondary | ICD-10-CM

## 2021-01-07 DIAGNOSIS — R519 Headache, unspecified: Secondary | ICD-10-CM

## 2021-01-07 DIAGNOSIS — Z23 Encounter for immunization: Secondary | ICD-10-CM

## 2021-01-07 DIAGNOSIS — F3170 Bipolar disorder, currently in remission, most recent episode unspecified: Secondary | ICD-10-CM

## 2021-01-07 NOTE — Assessment & Plan Note (Signed)
Encouraged diet and exercise for weight loss ?

## 2021-01-07 NOTE — Assessment & Plan Note (Signed)
CMET and lipid profile today Encouraged her to consume a low fat diet Continue Atorvastatin 

## 2021-01-07 NOTE — Assessment & Plan Note (Signed)
Ok to take Imodium OTC as needed

## 2021-01-07 NOTE — Assessment & Plan Note (Signed)
Controlled on HCTZ Reinforced DASH diet and exercise for weight loss CMET today 

## 2021-01-07 NOTE — Assessment & Plan Note (Signed)
Continue treatment per psychiatry Will monitor

## 2021-01-07 NOTE — Assessment & Plan Note (Signed)
CBC today Continue oral iron 

## 2021-01-07 NOTE — Assessment & Plan Note (Signed)
Continue treatment per psychiatry Will monitor 

## 2021-01-07 NOTE — Assessment & Plan Note (Signed)
Continue Ibuprofen as needed. 

## 2021-01-07 NOTE — Assessment & Plan Note (Signed)
Continue Flovent and Albuterol

## 2021-01-07 NOTE — Progress Notes (Signed)
Subjective:    Patient ID: Tabitha [name], female    DOB: [birth year], 50 y.o.   MRN: [id number]  HPI  Patient presents the clinic today for follow-up of chronic conditions.  She is establishing care with me today, transferring care from Malva Cogan, NP.  HTN: Her BP today is 129/57.  She is taking HCTZ as prescribed.  ECG from 08/2019 reviewed.  OSA: She averages 7 hours of sleep per night with the use of her CPAP.  She does not always feel rested when she wakes up.  There is no sleep study on file.  Asthma: She reports intermittent shortness of breath.  She is taking Flovent and Albuterol as prescribed.  There are no PFTs on file.  GERD: She is not sure what exactly triggers this.  She does have breakthrough on Omeprazole. She takes Tums as needed  Upper GI from 11/2012 reviewed.  Chronic Diarrhea: She does not take anything for this. There is no colonoscopy on file. She does not follow with GI.  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  She is taking Metformin, Actos, Tresbia, Ozempic and Gabapentin as prescribed.  Her sugars range 160-295.  She does not check her feet routinely.  Her last eye exam was more than 1 year ago.  Flu 01/2020.  COVID never.  Pneumovax 04/2015.  OCD/Schizoaffective/Bipolar disorder: Managed on Amantadine, Invega, Trilafon and Viibryd.  She is currently seeing a psychiatrist and a therapist.  She denies SI/HI.  IDA: Her last H/H was 13.3/39.6, 04/2018.  She is taking oral iron as prescribed.  She does not follow with hematology.  HLD: Her last LDL was 51, triglycerides 254, 08/2019.  She denies myalgias on Atorvastatin.  She tries to consume a low-fat diet.  Frequent Headaches: These occur 1 x month.  Triggered by stress.  She takes Ibuprofen as needed good relief of symptoms.  She does not follow with neurology.  Review of Systems     Past Medical History:  Diagnosis Date   Allergic rhinitis    Anxiety    Asthma    Bipolar disorder (HCC)     Depression    Diabetes mellitus without complication (HCC)    GERD (gastroesophageal reflux disease)    Hyperlipidemia    Hypertension    Mild sleep apnea    OCD (obsessive compulsive disorder)    Palpitations    Paranoia (HCC)    Schizoaffective disorder (HCC)    Sleep apnea     Current Outpatient Medications  Medication Sig Dispense Refill   ACCU-CHEK GUIDE test strip USE TO CHECK BLOOD SUGAR TWICE DAILY 100 strip 2   albuterol (VENTOLIN HFA) 108 (90 Base) MCG/ACT inhaler INHALE 2 PUFFS INTO THE LUNGS EVERY 6 HOURS AS NEEDED FOR WHEEZING OR SHORTNESS OF BREATH 25.5 g 3   Amantadine HCl 100 MG tablet Take 0.5 tablets (50 mg total) by mouth 2 (two) times daily. 30 tablet 1   atorvastatin (LIPITOR) 20 MG tablet TAKE 1 TABLET(20 MG) BY MOUTH AT BEDTIME 90 tablet 0   doxycycline (VIBRAMYCIN) 50 MG capsule TAKE 1 CAPSULE(50 MG) BY MOUTH EVERY MORNING 90 capsule 0   fluticasone (FLOVENT HFA) 110 MCG/ACT inhaler Inhale 1 puff into the lungs 2 (two) times daily. 1 Inhaler 11   gabapentin (NEURONTIN) 600 MG tablet Take 1 tablet (600 mg total) by mouth 3 (three) times daily. 270 tablet 1   hydrochlorothiazide (HYDRODIURIL) 12.5 MG tablet TAKE 1 TABLET(12.5 MG) BY MOUTH DAILY 90 tablet  0   ibuprofen (ADVIL) 200 MG tablet Take 200 mg by mouth every 6 (six) hours as needed.     insulin degludec (TRESIBA FLEXTOUCH) 100 UNIT/ML FlexTouch Pen INJECT 0.4 MLS(40 UNITS TOTAL) UNDER THE SKIN DAILY 15 mL 5   Insulin Pen Needle (BD PEN NEEDLE NANO U/F) 32G X 4 MM MISC 1 each by Does not apply route daily. 100 each 12   MELATONIN PO Take 30 mg by mouth. Taking 1 tablet daily at night     meloxicam (MOBIC) 15 MG tablet TAKE 1 TABLET(15 MG) BY MOUTH DAILY 30 tablet 0   metFORMIN (GLUCOPHAGE) 500 MG tablet TAKE 2 TABLETS(1000 MG) BY MOUTH TWICE DAILY WITH A MEAL 360 tablet 0   NOVOTWIST 32G X 5 MM MISC USE WITH LEVEMIR PEN BID  5   omeprazole (PRILOSEC) 10 MG capsule TAKE 1 CAPSULE BY MOUTH DAILY 90 capsule 0    paliperidone (INVEGA SUSTENNA) 234 MG/1.5ML SUSY injection Inject 234 mg into the muscle every 28 (twenty-eight) days. 1.5 mL 11   [START ON 01/22/2021] perphenazine (TRILAFON) 4 MG tablet Take 1 tablet (4 mg total) by mouth at bedtime. 90 tablet 0   phenazopyridine (PYRIDIUM) 200 MG tablet Take 1 tablet (200 mg total) by mouth 3 (three) times daily as needed for pain. 10 tablet 0   pioglitazone (ACTOS) 45 MG tablet TAKE 1 TABLET(45 MG) BY MOUTH DAILY 90 tablet 1   Semaglutide,0.25 or 0.5MG /DOS, (OZEMPIC, 0.25 OR 0.5 MG/DOSE,) 2 MG/1.5ML SOPN Inject 0.375 mLs (0.5 mg total) into the skin once a week. 1 pen 5   SUPER B COMPLEX/C PO Take by mouth.     Vilazodone HCl (VIIBRYD) 40 MG TABS Take 1 tablet (40 mg total) by mouth daily. 90 tablet 1   Paliperidone ER (INVEGA SUSTENNA) injection Inject 78 mg into the muscle once for 1 dose. 0.5 mL 0   Pseudoephedrine-APAP-DM (DAYQUIL PO) Take by mouth.     Current Facility-Administered Medications  Medication Dose Route Frequency Provider Last Rate Last Admin   paliperidone (INVEGA SUSTENNA) injection 234 mg  234 mg Intramuscular Q30 days Zena Amos, MD   234 mg at 01/02/21 1540    Allergies  Allergen Reactions   Abilify [Aripiprazole] Hives    Family History  Problem Relation Age of Onset   Lupus Mother    Diabetes Mother        pre diabetes   Diabetes Father    Hyperlipidemia Father    Diabetes Brother    Depression Paternal Grandmother    Bipolar disorder Paternal Grandmother     Social History   Socioeconomic History   Marital status: Married    Spouse name: kevin   Number of children: 2   Years of education: Not on file   Highest education level: Bachelor's degree (e.g., BA, AB, BS)  Occupational History   Occupation: disability  Tobacco Use   Smoking status: Never   Smokeless tobacco: Never  Vaping Use   Vaping Use: Never used  Substance and Sexual Activity   Alcohol use: No    Alcohol/week: 0.0 standard drinks    Drug use: No   Sexual activity: Yes    Partners: Male    Birth control/protection: None, Surgical  Other Topics Concern   Not on file  Social History Narrative   Not on file   Social Determinants of Health   Financial Resource Strain: Low Risk    Difficulty of Paying Living Expenses: Not hard at all  Food  Insecurity: No Food Insecurity   Worried About Programme researcher, broadcasting/film/video in the Last Year: Never true   Ran Out of Food in the Last Year: Never true  Transportation Needs: No Transportation Needs   Lack of Transportation (Medical): No   Lack of Transportation (Non-Medical): No  Physical Activity: Inactive   Days of Exercise per Week: 0 days   Minutes of Exercise per Session: 0 min  Stress: Stress Concern Present   Feeling of Stress : To some extent  Social Connections: Not on file  Intimate Partner Violence: Not on file     Constitutional: Pt reports intermittent headaches. Denies fever, malaise, fatigue, or abrupt weight changes.  HEENT: Denies eye pain, eye redness, ear pain, ringing in the ears, wax buildup, runny nose, nasal congestion, bloody nose, or sore throat. Respiratory: Pt reports intermittent shortness of breath. Denies difficulty breathing, cough or sputum production.   Cardiovascular: Denies chest pain, chest tightness, palpitations or swelling in the hands or feet.  Gastrointestinal: Pt reports intermittent diarrhea and reflux. Denies abdominal pain, bloating, constipation, or blood in the stool.  GU: Denies urgency, frequency, pain with urination, burning sensation, blood in urine, odor or discharge. Musculoskeletal: Denies decrease in range of motion, difficulty with gait, muscle pain or joint pain and swelling.  Skin: Denies redness, rashes, lesions or ulcercations.  Neurological: Denies dizziness, difficulty with memory, difficulty with speech or problems with balance and coordination.  Psych: Pt has a history of mood disorder. Denies SI/HI.  No other specific  complaints in a complete review of systems (except as listed in HPI above).  Objective:   Physical Exam  BP (!) 129/57 (BP Location: Right Arm, Patient Position: Sitting, Cuff Size: Large)   Pulse 81   Temp (!) 97.5 F (36.4 C) (Temporal)   Resp 17   Ht 5\' 2"  (1.575 m)   Wt 240 lb 9.6 oz (109.1 kg)   SpO2 100%   BMI 44.01 kg/m   Wt Readings from Last 3 Encounters:  09/03/20 242 lb (109.8 kg)  04/15/20 244 lb 3.2 oz (110.8 kg)  12/18/19 236 lb (107 kg)    General: Appears her stated age, obese, in NAD. Skin: Warm, dry and intact. No ulcerations noted. HEENT: Head: normal shape and size; Eyes: sclera white and EOMs intact;   Cardiovascular: Normal rate and rhythm. S1,S2 noted.  No murmur, rubs or gallops noted. Trace BLE edema. No carotid bruits noted. Pulmonary/Chest: Normal effort and positive vesicular breath sounds. No respiratory distress. No wheezes, rales or ronchi noted.  Abdomen: Soft and mildly tender in the epigastric region. Normal bowel sounds.  Musculoskeletal:  No difficulty with gait.  Neurological: Alert and oriented. Decreased sensation to BLE. Psychiatric: Mood and affect flat. Behavior is normal. Judgment and thought content normal.    BMET    Component Value Date/Time   NA 137 09/07/2019 1657   K 4.1 09/07/2019 1657   CL 94 (L) 09/07/2019 1657   CO2 24 09/07/2019 1657   GLUCOSE 220 (H) 09/07/2019 1657   GLUCOSE 162 (H) 09/26/2015 1154   BUN 14 09/07/2019 1657   CREATININE 0.90 09/07/2019 1657   CREATININE 0.73 09/26/2015 1154   CALCIUM 9.4 09/07/2019 1657   GFRNONAA 76 09/07/2019 1657   GFRAA 88 09/07/2019 1657    Lipid Panel     Component Value Date/Time   CHOL 138 09/07/2019 1657   CHOL 176 04/23/2017 1341   TRIG 202 (H) 09/07/2019 1657   TRIG 253 (H) 04/23/2017 1341  HDL 55 09/07/2019 1657   CHOLHDL 2.9 09/26/2015 1154   VLDL 51 (H) 04/23/2017 1341   LDLCALC 51 09/07/2019 1657    CBC    Component Value Date/Time   WBC 6.4  04/28/2018 1604   WBC 5.4 09/26/2015 1154   RBC 4.61 04/28/2018 1604   RBC 4.04 09/26/2015 1154   HGB 13.3 04/28/2018 1604   HCT 39.6 04/28/2018 1604   PLT 201 04/28/2018 1604   MCV 86 04/28/2018 1604   MCH 28.9 04/28/2018 1604   MCH 28.7 09/26/2015 1154   MCHC 33.6 04/28/2018 1604   MCHC 33.2 09/26/2015 1154   RDW 15.1 04/28/2018 1604   LYMPHSABS 2.4 04/28/2018 1604   MONOABS 378 09/26/2015 1154   EOSABS 0.1 04/28/2018 1604   BASOSABS 0.0 04/28/2018 1604    Hgb A1C Lab Results  Component Value Date   HGBA1C 6.6 (H) 09/07/2019            Assessment & Plan:   Nicki Reaper, NP This visit occurred during the SARS-CoV-2 public health emergency.  Safety protocols were in place, including screening questions prior to the visit, additional usage of staff PPE, and extensive cleaning of exam room while observing appropriate contact time as indicated for disinfecting solutions.

## 2021-01-07 NOTE — Assessment & Plan Note (Signed)
Encouraged her to try to identify foods that trigger her reflux and avoid them Encouraged weight loss as this can help reduce reflux symptoms Continue Omeprazole and Tums

## 2021-01-07 NOTE — Patient Instructions (Signed)
Carbohydrate Counting for Diabetes Mellitus, Adult Carbohydrate counting is a method of keeping track of how many carbohydrates you eat. Eating carbohydrates naturally increases the amount of sugar (glucose) in the blood. Counting how many carbohydrates you eat improves your blood glucose control, which helps you manage your diabetes. It is important to know how many carbohydrates you can safely have in each meal. This is different for every person. A dietitian can help you make a meal plan and calculate how many carbohydrates you should have at each meal and snack. What foods contain carbohydrates? Carbohydrates are found in the following foods: Grains, such as breads and cereals. Dried beans and soy products. Starchy vegetables, such as potatoes, peas, and corn. Fruit and fruit juices. Milk and yogurt. Sweets and snack foods, such as cake, cookies, candy, chips, and soft drinks. How do I count carbohydrates in foods? There are two ways to count carbohydrates in food. You can read food labels or learn standard serving sizes of foods. You can use either of the methods or a combination of both. Using the Nutrition Facts label The Nutrition Facts list is included on the labels of almost all packaged foods and beverages in the [place] It includes: The serving size. Information about nutrients in each serving, including the grams (g) of carbohydrate per serving. To use the Nutrition Facts: Decide how many servings you will have. Multiply the number of servings by the number of carbohydrates per serving. The resulting number is the total amount of carbohydrates that you will be having. Learning the standard serving sizes of foods When you eat carbohydrate foods that are not packaged or do not include Nutrition Facts on the label, you need to measure the servings in order to count the amount of carbohydrates. Measure the foods that you will eat with a food scale or measuring cup, if needed. Decide how  many standard-size servings you will eat. Multiply the number of servings by 15. For foods that contain carbohydrates, one serving equals 15 g of carbohydrates. For example, if you eat 2 cups or 10 oz (300 g) of strawberries, you will have eaten 2 servings and 30 g of carbohydrates (2 servings x 15 g = 30 g). For foods that have more than one food mixed, such as soups and casseroles, you must count the carbohydrates in each food that is included. The following list contains standard serving sizes of common carbohydrate-rich foods. Each of these servings has about 15 g of carbohydrates: 1 slice of bread. 1 six-inch (15 cm) tortilla. ? cup or 2 oz (53 g) cooked rice or pasta.  cup or 3 oz (85 g) cooked or canned, drained and rinsed beans or lentils.  cup or 3 oz (85 g) starchy vegetable, such as peas, corn, or squash.  cup or 4 oz (120 g) hot cereal.  cup or 3 oz (85 g) boiled or mashed potatoes, or  or 3 oz (85 g) of a large baked potato.  cup or 4 fl oz (118 mL) fruit juice. 1 cup or 8 fl oz (237 mL) milk. 1 small or 4 oz (106 g) apple.  or 2 oz (63 g) of a medium banana. 1 cup or 5 oz (150 g) strawberries. 3 cups or 1 oz (24 g) popped popcorn. What is an example of carbohydrate counting? To calculate the number of carbohydrates in this sample meal, follow the steps shown below. Sample meal 3 oz (85 g) chicken breast. ? cup or 4 oz (106 g) brown   rice.  cup or 3 oz (85 g) corn. 1 cup or 8 fl oz (237 mL) milk. 1 cup or 5 oz (150 g) strawberries with sugar-free whipped topping. Carbohydrate calculation Identify the foods that contain carbohydrates: Rice. Corn. Milk. Strawberries. Calculate how many servings you have of each food: 2 servings rice. 1 serving corn. 1 serving milk. 1 serving strawberries. Multiply each number of servings by 15 g: 2 servings rice x 15 g = 30 g. 1 serving corn x 15 g = 15 g. 1 serving milk x 15 g = 15 g. 1 serving strawberries x 15 g = 15  g. Add together all of the amounts to find the total grams of carbohydrates eaten: 30 g + 15 g + 15 g + 15 g = 75 g of carbohydrates total. What are tips for following this plan? Shopping Develop a meal plan and then make a shopping list. Buy fresh and frozen vegetables, fresh and frozen fruit, dairy, eggs, beans, lentils, and whole grains. Look at food labels. Choose foods that have more fiber and less sugar. Avoid processed foods and foods with added sugars. Meal planning Aim to have the same amount of carbohydrates at each meal and for each snack time. Plan to have regular, balanced meals and snacks. Where to find more information American Diabetes Association: www.diabetes.org Centers for Disease Control and Prevention: www.cdc.gov Summary Carbohydrate counting is a method of keeping track of how many carbohydrates you eat. Eating carbohydrates naturally increases the amount of sugar (glucose) in the blood. Counting how many carbohydrates you eat improves your blood glucose control, which helps you manage your diabetes. A dietitian can help you make a meal plan and calculate how many carbohydrates you should have at each meal and snack. This information is not intended to replace advice given to you by your health care provider. Make sure you discuss any questions you have with your health care provider. Document Revised: 04/13/2019 Document Reviewed: 04/14/2019 Elsevier Patient Education  2021 Elsevier Inc.  

## 2021-01-07 NOTE — Assessment & Plan Note (Signed)
Continue Doxycyline

## 2021-01-07 NOTE — Assessment & Plan Note (Signed)
A1C and urine microalbumin today Encouraged her to consume a low carb diet and exercise for weight loss Continue Metformin, Actos, Tresbia, Ozempic and Gabapentin as prescribed Advised her to schedule an appt for an eye exam Foot exam today Flu shot today Pneumovax UTD She declines Covid vaccine

## 2021-01-08 ENCOUNTER — Other Ambulatory Visit: Payer: Self-pay | Admitting: Internal Medicine

## 2021-01-08 ENCOUNTER — Other Ambulatory Visit: Payer: Medicare Other

## 2021-01-08 DIAGNOSIS — Z794 Long term (current) use of insulin: Secondary | ICD-10-CM | POA: Diagnosis not present

## 2021-01-08 DIAGNOSIS — E119 Type 2 diabetes mellitus without complications: Secondary | ICD-10-CM | POA: Diagnosis not present

## 2021-01-08 NOTE — Telephone Encounter (Signed)
Requested medication (s) are due for refill today - yes  Requested medication (s) are on the active medication list -yes  Future visit scheduled -yes  Last refill: 08/31/20  Notes to clinic: Request RF: Rx denied yesterday- patient had appointment yesterday- notes to continue-but not filled-sent for review   Requested Prescriptions  Pending Prescriptions Disp Refills   TRESIBA FLEXTOUCH 100 UNIT/ML FlexTouch Pen [Pharmacy Med Name: TRESIBA FLEXTOUCH PEN (U-100)INJ3ML] 15 mL 5    Sig: INJECT 40 UNITS UNDER THE SKIN EVERY DAY     Endocrinology:  Diabetes - Insulins Failed - 01/08/2021  9:25 AM      Failed - HBA1C is between 0 and 7.9 and within 180 days    HB A1C (BAYER DCA - WAIVED)  Date Value Ref Range Status  10/22/2017 6.9 <7.0 % Final    Comment:                                          Diabetic Adult            <7.0                                       Healthy Adult        4.3 - 5.7                                                           (DCCT/NGSP) American Diabetes Association's Summary of Glycemic Recommendations for Adults with Diabetes: Hemoglobin A1c <7.0%. More stringent glycemic goals (A1c <6.0%) may further reduce complications at the cost of increased risk of hypoglycemia.    Hgb A1c MFr Bld  Date Value Ref Range Status  09/07/2019 6.6 (H) 4.8 - 5.6 % Final    Comment:             Prediabetes: 5.7 - 6.4          Diabetes: >6.4          Glycemic control for adults with diabetes: <7.0           Passed - Valid encounter within last 6 months    Recent Outpatient Visits           Yesterday Influenza vaccine needed   [facility] [place], Salvadore Oxford, NP   1 month ago COVID-19   [facility] [place], Salvadore Oxford, NP   8 months ago Encounter to establish care with new doctor   [facility], Jodelle Gross, FNP   1 year ago Acute lower UTI   [facility], Salley Hews, [place]   1 year ago Type 2  diabetes mellitus with hyperglycemia, with long-term current use of insulin [facility])   [facility], Salley Hews, [place]       Future Appointments             In 3 months Baity, Salvadore Oxford, NP [facility]   In 8 months  [facility]               Requested  Prescriptions  Pending Prescriptions Disp Refills   TRESIBA FLEXTOUCH 100 UNIT/ML FlexTouch Pen [Pharmacy Med Name: TRESIBA FLEXTOUCH PEN (U-100)INJ3ML] 15 mL 5    Sig: INJECT 40 UNITS UNDER THE SKIN EVERY DAY     Endocrinology:  Diabetes - Insulins Failed - 01/08/2021  9:25 AM      Failed - HBA1C is between 0 and 7.9 and within 180 days    HB A1C (BAYER DCA - WAIVED)  Date Value Ref Range Status  10/22/2017 6.9 <7.0 % Final    Comment:                                          Diabetic Adult            <7.0                                       Healthy Adult        4.3 - 5.7                                                           (DCCT/NGSP) American Diabetes Association's Summary of Glycemic Recommendations for Adults with Diabetes: Hemoglobin A1c <7.0%. More stringent glycemic goals (A1c <6.0%) may further reduce complications at the cost of increased risk of hypoglycemia.    Hgb A1c MFr Bld  Date Value Ref Range Status  09/07/2019 6.6 (H) 4.8 - 5.6 % Final    Comment:             Prediabetes: 5.7 - 6.4          Diabetes: >6.4          Glycemic control for adults with diabetes: <7.0           Passed - Valid encounter within last 6 months    Recent Outpatient Visits           Yesterday Influenza vaccine needed   [facility] [place], Salvadore Oxford, NP   1 month ago COVID-19   [facility] [place], Salvadore Oxford, NP   8 months ago Encounter to establish care with new doctor   [facility], Jodelle Gross, FNP   1 year ago Acute lower UTI   [facility], Salley Hews, [place]   1 year ago Type 2  diabetes mellitus with hyperglycemia, with long-term current use of insulin [facility])   [facility], Salley Hews, [place]       Future Appointments             In 3 months Baity, Salvadore Oxford, NP [facility], [facility]   In 8 months  [facility]

## 2021-01-09 LAB — COMPLETE METABOLIC PANEL WITH GFR
AG Ratio: 1.9 (calc) (ref 1.0–2.5)
ALT: 27 U/L (ref 6–29)
AST: 16 U/L (ref 10–35)
Albumin: 4.2 g/dL (ref 3.6–5.1)
Alkaline phosphatase (APISO): 103 U/L (ref 31–125)
BUN: 11 mg/dL (ref 7–25)
CO2: 29 mmol/L (ref 20–32)
Calcium: 9.9 mg/dL (ref 8.6–10.2)
Chloride: 96 mmol/L — ABNORMAL LOW (ref 98–110)
Creat: 0.69 mg/dL (ref 0.50–0.99)
Globulin: 2.2 g/dL (calc) (ref 1.9–3.7)
Glucose, Bld: 288 mg/dL — ABNORMAL HIGH (ref 65–99)
Potassium: 4.2 mmol/L (ref 3.5–5.3)
Sodium: 134 mmol/L — ABNORMAL LOW (ref 135–146)
Total Bilirubin: 0.5 mg/dL (ref 0.2–1.2)
Total Protein: 6.4 g/dL (ref 6.1–8.1)
eGFR: 107 mL/min/{1.73_m2} (ref >=60)

## 2021-01-09 LAB — CBC
HCT: 36.5 % (ref 35.0–45.0)
Hemoglobin: 11.9 g/dL (ref 11.7–15.5)
MCH: 30 pg (ref 27.0–33.0)
MCHC: 32.6 g/dL (ref 32.0–36.0)
MCV: 91.9 fL (ref 80.0–100.0)
MPV: 10.4 fL (ref 7.5–12.5)
Platelets: 169 10*3/uL (ref 140–400)
RBC: 3.97 10*6/uL (ref 3.80–5.10)
RDW: 14.4 % (ref 11.0–15.0)
WBC: 5.3 10*3/uL (ref 3.8–10.8)

## 2021-01-09 LAB — LIPID PANEL
Cholesterol: 147 mg/dL (ref <200)
HDL: 49 mg/dL — ABNORMAL LOW (ref >=50)
Non-HDL Cholesterol (Calc): 98 mg/dL (calc) (ref <130)
Total CHOL/HDL Ratio: 3 (calc) (ref <5.0)
Triglycerides: 411 mg/dL — ABNORMAL HIGH (ref <150)

## 2021-01-09 LAB — HEMOGLOBIN A1C
Hgb A1c MFr Bld: 7.4 % of total Hgb — ABNORMAL HIGH (ref <5.7)
Mean Plasma Glucose: 166 mg/dL
eAG (mmol/L): 9.2 mmol/L

## 2021-01-09 LAB — MICROALBUMIN / CREATININE URINE RATIO
Creatinine, Urine: 23 mg/dL (ref 20–275)
Microalb Creat Ratio: 9 mcg/mg creat (ref <30)
Microalb, Ur: 0.2 mg/dL

## 2021-01-09 NOTE — Addendum Note (Signed)
Addended by: Lonna Cobb on: 01/09/2021 02:55 PM   Modules accepted: Orders

## 2021-01-10 MED ORDER — OZEMPIC (0.25 OR 0.5 MG/DOSE) 2 MG/1.5ML ~~LOC~~ SOPN: 1.0000 mg | PEN_INJECTOR | SUBCUTANEOUS | 2 refills | Status: DC

## 2021-01-10 MED ORDER — PIOGLITAZONE HCL 45 MG PO TABS: ORAL_TABLET | ORAL | 0 refills | Status: DC

## 2021-01-10 MED ORDER — METFORMIN HCL 500 MG PO TABS: 1000.0000 mg | ORAL_TABLET | Freq: Two times a day (BID) | ORAL | 0 refills | Status: DC

## 2021-01-10 MED ORDER — ATORVASTATIN CALCIUM 20 MG PO TABS: ORAL_TABLET | ORAL | 0 refills | Status: DC

## 2021-01-10 MED ORDER — EZETIMIBE 10 MG PO TABS: 10.0000 mg | ORAL_TABLET | Freq: Every day | ORAL | 0 refills | Status: DC

## 2021-01-10 NOTE — Addendum Note (Signed)
Addended by: Lorre Munroe on: 01/10/2021 11:21 AM   Modules accepted: Orders

## 2021-01-27 DIAGNOSIS — G4733 Obstructive sleep apnea (adult) (pediatric): Secondary | ICD-10-CM | POA: Diagnosis not present

## 2021-01-30 ENCOUNTER — Other Ambulatory Visit: Payer: Self-pay | Admitting: Psychiatry

## 2021-01-30 ENCOUNTER — Ambulatory Visit (INDEPENDENT_AMBULATORY_CARE_PROVIDER_SITE_OTHER): Payer: Medicare Other

## 2021-01-30 ENCOUNTER — Other Ambulatory Visit: Payer: Self-pay

## 2021-01-30 DIAGNOSIS — F251 Schizoaffective disorder, depressive type: Secondary | ICD-10-CM | POA: Diagnosis not present

## 2021-01-30 MED ORDER — PALIPERIDONE PALMITATE ER 234 MG/1.5ML IM SUSY
234.0000 mg | PREFILLED_SYRINGE | INTRAMUSCULAR | Status: AC
  Administered 2021-01-30 – 2021-06-05 (×5): 234 mg via INTRAMUSCULAR

## 2021-01-30 NOTE — Patient Instructions (Signed)
    pt was given invega sustenna 234mg /1.79ml in the upper left out quatient.   Pt was talkative she talked about that one of her son birthday is on Halloween.  pt appears clean. Pt states she doing well..    s/n # 4m expt 4-24  lot # [id number]   NDC# [id number].   Pt to return in approximately 28 days for next injection

## 2021-01-31 NOTE — Progress Notes (Signed)
Virtual Visit via Video Note  I connected with Tabitha [name] on 02/04/21 at  3:00 PM EDT by a video enabled telemedicine application and verified that I am speaking with the correct person using two identifiers.  Location: Patient: home Provider: office Persons participated in the visit- patient, provider    I discussed the limitations of evaluation and management by telemedicine and the availability of in person appointments. The patient expressed understanding and agreed to proceed.  I discussed the assessment and treatment plan with the patient. The patient was provided an opportunity to ask questions and all were answered. The patient agreed with the plan and demonstrated an understanding of the instructions.   The patient was advised to call back or seek an in-person evaluation if the symptoms worsen or if the condition fails to improve as anticipated.  I provided 13 minutes of non-face-to-face time during this encounter.   [name], MD    [facility] MD/PA/NP OP Progress Note  02/04/2021 5:33 PM Tabitha [name]  MRN:  [id number]  Chief Complaint:  Chief Complaint   Follow-up; Schizophrenia    HPI:  This is a follow-up appointment for schizoaffective disorder and anxiety.  She states that although she was paranoid, it has been getting better since she received a shot a few days ago.  Although she continues to feel that people are talking about her at church, she has been able to attend there regularly.  She feels anxious and depressed when she thinks about the upcoming trial.  She feels very anxious all of a sudden without any reason.  She wonders if it is normal to feel this way.  She was offered reassurance that it is reasonable for her to feel stressed due to the current situation.  She sleeps well.  She has fair energy.  She denies change in weight or appetite.  She denies SI.  She denies decreased need for sleep or euphonia.  She has occasional AH of hearing kids voice.  She  denies VH.  She denies ideas of reference.  She feels comfortable to stay on the current medication at this time.    Daily routine: house chores, goes to church weekly. Choir on Wed,  Exercise: Employment: unemployed, on disability due to paranoia (could not keep her job due to paranoia that people coming to her workplace). used to work at [employer] until 2007 (quit due to pregnancy) Support: husband, son, parents Household: husband, 2 sons (age 67,15) Marital status: married Number of children: 2 sons Education: college, majored in Psychologist, educational, music  Visit Diagnosis:    ICD-10-CM   1. Schizoaffective disorder, depressive type (HCC)  F25.1     2. Obsessive-compulsive disorder, unspecified type  F42.9     3. Anxiety state  F41.1       Past Psychiatric History: Please see initial evaluation for full details. I have reviewed the history. No updates at this time.     Past Medical History:  Past Medical History:  Diagnosis Date   Allergic rhinitis    Anxiety    Asthma    Bipolar disorder (HCC)    Depression    Diabetes mellitus without complication (HCC)    GERD (gastroesophageal reflux disease)    Hyperlipidemia    Hypertension    Mild sleep apnea    OCD (obsessive compulsive disorder)    Palpitations    Paranoia (HCC)    Schizoaffective disorder (HCC)    Sleep apnea     Past Surgical History:  Procedure Laterality Date   CESAREAN SECTION  2006/2008   CHOLECYSTECTOMY  2006    Family Psychiatric History: Please see initial evaluation for full details. I have reviewed the history. No updates at this time.     Family History:  Family History  Problem Relation Age of Onset   Lupus Mother    Diabetes Mother        pre diabetes   Diabetes Father    Hyperlipidemia Father    Diabetes Brother    Depression Paternal Grandmother    Bipolar disorder Paternal Grandmother     Social History:  Social History   Socioeconomic History   Marital status: Married    Spouse name:  kevin   Number of children: 2   Years of education: Not on file   Highest education level: Bachelor's degree (e.g., BA, AB, BS)  Occupational History   Occupation: disability  Tobacco Use   Smoking status: Never   Smokeless tobacco: Never  Vaping Use   Vaping Use: Never used  Substance and Sexual Activity   Alcohol use: No    Alcohol/week: 0.0 standard drinks   Drug use: No   Sexual activity: Yes    Partners: Male    Birth control/protection: None, Surgical  Other Topics Concern   Not on file  Social History Narrative   Not on file   Social Determinants of Health   Financial Resource Strain: Low Risk    Difficulty of Paying Living Expenses: Not hard at all  Food Insecurity: No Food Insecurity   Worried About Programme researcher, broadcasting/film/video in the Last Year: Never true   Ran Out of Food in the Last Year: Never true  Transportation Needs: No Transportation Needs   Lack of Transportation (Medical): No   Lack of Transportation (Non-Medical): No  Physical Activity: Inactive   Days of Exercise per Week: 0 days   Minutes of Exercise per Session: 0 min  Stress: Stress Concern Present   Feeling of Stress : To some extent  Social Connections: Not on file    Allergies:  Allergies  Allergen Reactions   Abilify [Aripiprazole] Hives    Metabolic Disorder Labs: Lab Results  Component Value Date   HGBA1C 7.4 (H) 01/08/2021   MPG 166 01/08/2021   Lab Results  Component Value Date   PROLACTIN 84.8 (H) 06/01/2017   PROLACTIN 36.2 (H) 03/26/2016   Lab Results  Component Value Date   CHOL 147 01/08/2021   TRIG 411 (H) 01/08/2021   HDL 49 (L) 01/08/2021   CHOLHDL 3.0 01/08/2021   VLDL 51 (H) 04/23/2017   LDLCALC  01/08/2021     Comment:     . LDL cholesterol not calculated. Triglyceride levels greater than 400 mg/dL invalidate calculated LDL results. . Reference range: <100 . Desirable range <100 mg/dL for primary prevention;   <70 mg/dL for patients with CHD or diabetic  patients  with > or = 2 CHD risk factors. Marland Kitchen LDL-C is now calculated using the Martin-Hopkins  calculation, which is a validated novel method providing  better accuracy than the Friedewald equation in the  estimation of LDL-C.  Horald Pollen et al. Lenox Ahr. 1610;960(45): 2061-2068  (http://education.QuestDiagnostics.com/faq/FAQ164)    LDLCALC 51 09/07/2019   Lab Results  Component Value Date   TSH 1.290 04/28/2018   TSH 2.570 06/01/2017    Therapeutic Level Labs: No results found for: LITHIUM Lab Results  Component Value Date   VALPROATE 52 10/22/2017   VALPROATE 43 (L) 06/01/2017  No components found for:  CBMZ  Current Medications: Current Outpatient Medications  Medication Sig Dispense Refill   ACCU-CHEK GUIDE test strip USE TO CHECK BLOOD SUGAR TWICE DAILY 100 strip 2   albuterol (VENTOLIN HFA) 108 (90 Base) MCG/ACT inhaler INHALE 2 PUFFS INTO THE LUNGS EVERY 6 HOURS AS NEEDED FOR WHEEZING OR SHORTNESS OF BREATH 25.5 g 3   Amantadine HCl 100 MG tablet Take 0.5 tablets (50 mg total) by mouth 2 (two) times daily. 30 tablet 1   atorvastatin (LIPITOR) 20 MG tablet TAKE 1 TABLET(20 MG) BY MOUTH AT BEDTIME 90 tablet 0   doxycycline (VIBRAMYCIN) 50 MG capsule TAKE 1 CAPSULE(50 MG) BY MOUTH EVERY MORNING 90 capsule 0   ezetimibe (ZETIA) 10 MG tablet Take 1 tablet (10 mg total) by mouth daily. 90 tablet 0   fluticasone (FLOVENT HFA) 110 MCG/ACT inhaler Inhale 1 puff into the lungs 2 (two) times daily. 1 Inhaler 11   gabapentin (NEURONTIN) 600 MG tablet Take 1 tablet (600 mg total) by mouth 3 (three) times daily. 270 tablet 1   hydrochlorothiazide (HYDRODIURIL) 12.5 MG tablet TAKE 1 TABLET(12.5 MG) BY MOUTH DAILY 90 tablet 0   ibuprofen (ADVIL) 200 MG tablet Take 200 mg by mouth every 6 (six) hours as needed.     insulin degludec (TRESIBA FLEXTOUCH) 100 UNIT/ML FlexTouch Pen INJECT 40 UNITS UNDER THE SKIN EVERY DAY 15 mL 5   Insulin Pen Needle (BD PEN NEEDLE NANO U/F) 32G X 4 MM MISC 1  each by Does not apply route daily. 100 each 12   MELATONIN PO Take 30 mg by mouth. Taking 1 tablet daily at night     metFORMIN (GLUCOPHAGE) 500 MG tablet Take 2 tablets (1,000 mg total) by mouth 2 (two) times daily with a meal. 360 tablet 0   NOVOTWIST 32G X 5 MM MISC USE WITH LEVEMIR PEN BID  5   omeprazole (PRILOSEC) 10 MG capsule TAKE 1 CAPSULE BY MOUTH DAILY 90 capsule 0   paliperidone (INVEGA SUSTENNA) 234 MG/1.5ML SUSY injection Inject 234 mg into the muscle every 28 (twenty-eight) days. 1.5 mL 11   perphenazine (TRILAFON) 4 MG tablet Take 1 tablet (4 mg total) by mouth at bedtime. 90 tablet 0   phenazopyridine (PYRIDIUM) 200 MG tablet Take 1 tablet (200 mg total) by mouth 3 (three) times daily as needed for pain. 10 tablet 0   pioglitazone (ACTOS) 45 MG tablet TAKE 1 TABLET(45 MG) BY MOUTH DAILY 90 tablet 0   Semaglutide,0.25 or 0.5MG /DOS, (OZEMPIC, 0.25 OR 0.5 MG/DOSE,) 2 MG/1.5ML SOPN Inject 0.375 mLs (0.5 mg total) into the skin once a week. 1 pen 5   Semaglutide,0.25 or 0.5MG /DOS, (OZEMPIC, 0.25 OR 0.5 MG/DOSE,) 2 MG/1.5ML SOPN Inject 1 mg into the skin once a week. 2 mL 2   SUPER B COMPLEX/C PO Take by mouth.     Vilazodone HCl (VIIBRYD) 40 MG TABS Take 1 tablet (40 mg total) by mouth daily. 90 tablet 1   Current Facility-Administered Medications  Medication Dose Route Frequency Provider Last Rate Last Admin   paliperidone (INVEGA SUSTENNA) injection 234 mg  234 mg Intramuscular Q28 days [name], MD   234 mg at 01/30/21 1035     Musculoskeletal: Strength & Muscle Tone:  N/A Gait & Station:  N/A Patient leans: N/A  Psychiatric Specialty Exam: Review of Systems  Psychiatric/Behavioral:  Positive for decreased concentration and sleep disturbance. Negative for agitation, behavioral problems, confusion, dysphoric mood, hallucinations, self-injury and suicidal ideas. The patient  is nervous/anxious. The patient is not hyperactive.   All other systems reviewed and are  negative.  There were no vitals taken for this visit.There is no height or weight on file to calculate BMI.  General Appearance: Fairly Groomed  Eye Contact:  Good  Speech:  Clear and Coherent  Volume:  Normal  Mood:  Anxious  Affect:  Appropriate, Congruent, and calm  Thought Process:  Coherent  Orientation:  Full (Time, Place, and Person)  Thought Content: Logical   Suicidal Thoughts:  No  Homicidal Thoughts:  No  Memory:  Immediate;   Good  Judgement:  Good  Insight:  Good  Psychomotor Activity:  Normal  Concentration:  Concentration: Good and Attention Span: Good  Recall:  Good  Fund of Knowledge: Good  Language: Good  Akathisia:  No  Handed:  Right  AIMS (if indicated): not done  Assets:  Communication Skills Desire for Improvement  ADL's:  Intact  Cognition: WNL  Sleep:  Poor   Screenings: AIMS    Flowsheet Row Office Visit from 05/12/2017 in [facility] Office Visit from 07/29/2016 in [facility] Office Visit from 05/06/2016 in [facility] Office Visit from 04/01/2016 in [facility] Office Visit from 03/28/2015 in [facility]  AIMS Total Score 0 0 0 0 0      GAD-7    Flowsheet Row Office Visit from 01/07/2021 in [facility] Office Visit from 04/28/2018 in [facility]  Total GAD-7 Score 7 8      Mini-Mental    Flowsheet Row Office Visit from 09/07/2019 in [facility]  Total Score (max 30 points ) 16      PHQ2-9    Flowsheet Row Office Visit from 01/07/2021 in [facility] Video Visit from 12/26/2020 in [facility] Clinical Support from 09/03/2020 in [facility] Clinical Support from 01/19/2019 in [facility] Office Visit from 04/28/2018 in [facility]  PHQ-2 Total Score 4 1 2  0 4  PHQ-9 Total Score 9 --  15 -- 11      Flowsheet Row Video Visit from 12/26/2020 in [facility] Video Visit from 12/05/2020 in [facility] Video Visit from 10/23/2020 in [facility]  C-SSRS RISK CATEGORY No Risk No Risk No Risk        Assessment and Plan:  Tabitha [name] is a 50 y.o. year old female with a history of schizoaffective disorder,  sleep apnea (using CPAP machine), diabetes, hypertension, hyperlipidemia, GERD, who presents for follow up appointment for below.   1. Schizoaffective disorder, depressive type (HCC) 2. Obsessive-compulsive disorder, unspecified type 3. Anxiety state Although she reports slight worsening in anxiety in the context of upcoming court about the case of her husband, there has been improvement in paranoia and hallucinations since getting injection regularly.  Will continue monthly IM Invega and perphenazine to target schizoaffective disorder.  Will continue Viibryd to target depression and anxiety.  Will continue gabapentin for anxiety.    # r.o tardive dyskinesia Amantadine has been prescribed by her previous provider.  It is unclear whether it was indicated for tardive dyskinesia.  Noted that she denies any symptoms to be concerned of tardive dyskinesia. She has not noticed much difference since tapering down amantadine.  Will plan to taper off this medication in the near future to avoid polypharmacy.   This clinician has discussed the  side effect associated with medication prescribed during this encounter. Please refer to notes in the previous encounters for more details.      Plan 1  Continue monthly invega 234 mg IM - monitor weight gain. She is on metformin.  2. Continue perphenazine 4 mg daily 3. Continue viibryd 40 mg 4. Continue amantadine 50 mg twice a day 5. Continue gabapentin 600 mg three times a day 6. Next appointment: 11/4 at 10 Am for 30 mins, video - She will see her PCP at  [facility]   Past trials of medication: Abilify (hives), olanzapine, quetiapine, risperidone (stopped working),    The patient demonstrates the following risk factors for suicide: Chronic risk factors for suicide include: psychiatric disorder of schizoaffective disorder. Acute risk factors for suicide include: unemployment. Protective factors for this patient include: positive social support, responsibility to others (children, family), hope for the future and religious beliefs against suicide. Considering these factors, the overall suicide risk at this point appears to be low. Patient is appropriate for outpatient follow up.  Although she has guns at home, she does not have access to them.   [name], MD 02/04/2021, 5:33 PM

## 2021-02-04 ENCOUNTER — Other Ambulatory Visit: Payer: Self-pay

## 2021-02-04 ENCOUNTER — Telehealth (INDEPENDENT_AMBULATORY_CARE_PROVIDER_SITE_OTHER): Payer: Medicare Other | Admitting: Psychiatry

## 2021-02-04 ENCOUNTER — Encounter: Payer: Self-pay | Admitting: Psychiatry

## 2021-02-04 DIAGNOSIS — F429 Obsessive-compulsive disorder, unspecified: Secondary | ICD-10-CM | POA: Diagnosis not present

## 2021-02-04 DIAGNOSIS — F411 Generalized anxiety disorder: Secondary | ICD-10-CM | POA: Diagnosis not present

## 2021-02-04 DIAGNOSIS — F251 Schizoaffective disorder, depressive type: Secondary | ICD-10-CM | POA: Diagnosis not present

## 2021-02-04 NOTE — Patient Instructions (Signed)
1  Continue monthly invega 234 mg IM 2. Continue perphenazine 4 mg daily 3. Continue viibryd 40 mg 4. Continue amantadine 50 mg twice a day 5. Continue gabapentin 600 mg three times a day 6. Next appointment: 11/4 at 10 Am

## 2021-02-13 ENCOUNTER — Other Ambulatory Visit: Payer: Self-pay | Admitting: Family Medicine

## 2021-02-13 NOTE — Telephone Encounter (Signed)
Requested medication (s) are due for refill today:Yes   Requested medication (s) are on the active medication list: Yes  Last refill:  07/12/20  Future visit scheduled: Yes  Notes to clinic:  Unable to refill per protocol, last refill by another provider.      Requested Prescriptions  Pending Prescriptions Disp Refills   albuterol (VENTOLIN HFA) 108 (90 Base) MCG/ACT inhaler [Pharmacy Med Name: ALBUTEROL HFA INH (200 PUFFS)8.5GM] 25.5 g 3    Sig: INHALE 2 PUFFS INTO THE LUNGS EVERY 6 HOURS AS NEEDED FOR WHEEZING OR SHORTNESS OF BREATH     Pulmonology:  Beta Agonists Failed - 02/13/2021  8:20 AM      Failed - One inhaler should last at least one month. If the patient is requesting refills earlier, contact the patient to check for uncontrolled symptoms.      Passed - Valid encounter within last 12 months    Recent Outpatient Visits           1 month ago Influenza vaccine needed   [facility] [place], Salvadore Oxford, NP   2 months ago COVID-19   [facility] [place], Salvadore Oxford, NP   10 months ago Encounter to establish care with new doctor   [facility], Jodelle Gross, FNP   1 year ago Acute lower UTI   [facility] [place], [place]   1 year ago Type 2 diabetes mellitus with hyperglycemia, with long-term current use of insulin [facility])   [facility], Salley Hews, [place]       Future Appointments             In 2 months Baity, Salvadore Oxford, NP [facility]   In 6 months  [facility]

## 2021-02-18 ENCOUNTER — Other Ambulatory Visit: Payer: Self-pay | Admitting: Psychiatry

## 2021-02-18 ENCOUNTER — Telehealth: Payer: Self-pay

## 2021-02-18 MED ORDER — AMANTADINE HCL 100 MG PO TABS: 50.0000 mg | ORAL_TABLET | Freq: Two times a day (BID) | ORAL | 0 refills | Status: DC

## 2021-02-18 NOTE — Telephone Encounter (Signed)
Ordered

## 2021-02-18 NOTE — Telephone Encounter (Signed)
received fax requesting a refill on the amantadine  

## 2021-02-27 ENCOUNTER — Ambulatory Visit: Payer: Medicare Other

## 2021-02-27 NOTE — Progress Notes (Signed)
Virtual Visit via Video Note  I connected with Tabitha [name] on 02/28/21 at 10:00 AM EDT by a video enabled telemedicine application and verified that I am speaking with the correct person using two identifiers.  Location: Patient: home Provider: office Persons participated in the visit- patient, provider    I discussed the limitations of evaluation and management by telemedicine and the availability of in person appointments. The patient expressed understanding and agreed to proceed.    I discussed the assessment and treatment plan with the patient. The patient was provided an opportunity to ask questions and all were answered. The patient agreed with the plan and demonstrated an understanding of the instructions.   The patient was advised to call back or seek an in-person evaluation if the symptoms worsen or if the condition fails to improve as anticipated.  I provided 17 minutes of non-face-to-face time during this encounter.   Tabitha Hotter, MD    [facility] MD/PA/NP OP Progress Note  02/28/2021 10:32 AM NOVI BELD  MRN:  [id number]  Chief Complaint:  Chief Complaint   Follow-up; Other    HPI:  This is a follow-up appointment for schizoaffective disorder and then anxiety.  She states that her husband will have court in December.  There is no change in this regard.  She had a cold, and has been feeling fatigue.  She states that she has paranoia that there are cameras.  Although she later reflects that is not true, she thinks these are real at the moment.  She tends to be introverted.  This also makes it difficult for her to go to church.  She occasionally has obsessive thoughts of whether or not she spiritually saved.  She cannot think of other things when it happens.  She continues to hear AH of her children or her husband's voice calling her.  She denies any other voice.  She denies VH.  She denies thought insertion.  She feels that people can read her mind.  She feels down.   She feels anxious at times.  She has occasional insomnia, which she attributes to having a cold.  She lost her weight, although she has good appetite.  She denies SI.  She had to postpone her monthly injection due to cold.  She states that she occasionally has some restless leg, which happens when she is awake and also at night.  It does not easily improve when she moves.  She agrees to stay on the current medication at this time except amantadine.    Daily routine: house chores, goes to church weekly. Choir on Wed,  Exercise: Employment: unemployed, on disability due to paranoia (could not keep her job due to paranoia that people coming to her workplace). used to work at [employer] until 2007 (quit due to pregnancy) Support: husband, son, parents Household: husband, 2 sons (age 58,15) Marital status: married Number of children: 2 sons Education: college, majored in Psychologist, educational, music  230 lbs Wt Readings from Last 3 Encounters:  01/30/21 235 lb 8 oz (106.8 kg)  01/07/21 240 lb 9.6 oz (109.1 kg)  01/02/21 235 lb 3.2 oz (106.7 kg)     Visit Diagnosis:    ICD-10-CM   1. Schizoaffective disorder, depressive type (HCC)  F25.1     2. Obsessive-compulsive disorder, unspecified type  F42.9     3. Anxiety state  F41.1       Past Psychiatric History: Please see initial evaluation for full details. I have reviewed the history. No  updates at this time.     Past Medical History:  Past Medical History:  Diagnosis Date   Allergic rhinitis    Anxiety    Asthma    Bipolar disorder (HCC)    Depression    Diabetes mellitus without complication (HCC)    GERD (gastroesophageal reflux disease)    Hyperlipidemia    Hypertension    Mild sleep apnea    OCD (obsessive compulsive disorder)    Palpitations    Paranoia (HCC)    Schizoaffective disorder (HCC)    Sleep apnea     Past Surgical History:  Procedure Laterality Date   CESAREAN SECTION  2006/2008   CHOLECYSTECTOMY  2006    Family  Psychiatric History: Please see initial evaluation for full details. I have reviewed the history. No updates at this time.     Family History:  Family History  Problem Relation Age of Onset   Lupus Mother    Diabetes Mother        pre diabetes   Diabetes Father    Hyperlipidemia Father    Diabetes Brother    Depression Paternal Grandmother    Bipolar disorder Paternal Grandmother     Social History:  Social History   Socioeconomic History   Marital status: Married    Spouse name: Tabitha [name]   Number of children: 2   Years of education: Not on file   Highest education level: Bachelor's degree (e.g., BA, AB, BS)  Occupational History   Occupation: disability  Tobacco Use   Smoking status: Never   Smokeless tobacco: Never  Vaping Use   Vaping Use: Never used  Substance and Sexual Activity   Alcohol use: No    Alcohol/week: 0.0 standard drinks   Drug use: No   Sexual activity: Yes    Partners: Male    Birth control/protection: None, Surgical  Other Topics Concern   Not on file  Social History Narrative   Not on file   Social Determinants of Health   Financial Resource Strain: Low Risk    Difficulty of Paying Living Expenses: Not hard at all  Food Insecurity: No Food Insecurity   Worried About Programme researcher, broadcasting/film/video in the Last Year: Never true   Ran Out of Food in the Last Year: Never true  Transportation Needs: No Transportation Needs   Lack of Transportation (Medical): No   Lack of Transportation (Non-Medical): No  Physical Activity: Inactive   Days of Exercise per Week: 0 days   Minutes of Exercise per Session: 0 min  Stress: Stress Concern Present   Feeling of Stress : To some extent  Social Connections: Not on file    Allergies:  Allergies  Allergen Reactions   Abilify [Aripiprazole] Hives    Metabolic Disorder Labs: Lab Results  Component Value Date   HGBA1C 7.4 (H) 01/08/2021   MPG 166 01/08/2021   Lab Results  Component Value Date   PROLACTIN  84.8 (H) 06/01/2017   PROLACTIN 36.2 (H) 03/26/2016   Lab Results  Component Value Date   CHOL 147 01/08/2021   TRIG 411 (H) 01/08/2021   HDL 49 (L) 01/08/2021   CHOLHDL 3.0 01/08/2021   VLDL 51 (H) 04/23/2017   LDLCALC  01/08/2021     Comment:     . LDL cholesterol not calculated. Triglyceride levels greater than 400 mg/dL invalidate calculated LDL results. . Reference range: <100 . Desirable range <100 mg/dL for primary prevention;   <70 mg/dL for patients with CHD or  diabetic patients  with > or = 2 CHD risk factors. Marland Kitchen LDL-C is now calculated using the Martin-Hopkins  calculation, which is a validated novel method providing  better accuracy than the Friedewald equation in the  estimation of LDL-C.  Horald Pollen et al. Lenox Ahr. 1610;960(45): 2061-2068  (http://education.[employer].com/faq/FAQ164)    LDLCALC 51 09/07/2019   Lab Results  Component Value Date   TSH 1.290 04/28/2018   TSH 2.570 06/01/2017    Therapeutic Level Labs: No results found for: LITHIUM Lab Results  Component Value Date   VALPROATE 52 10/22/2017   VALPROATE 43 (L) 06/01/2017   No components found for:  CBMZ  Current Medications: Current Outpatient Medications  Medication Sig Dispense Refill   albuterol (VENTOLIN HFA) 108 (90 Base) MCG/ACT inhaler INHALE 2 PUFFS INTO THE LUNGS EVERY 6 HOURS AS NEEDED FOR WHEEZING OR SHORTNESS OF BREATH 25.5 g 3   ACCU-CHEK GUIDE test strip USE TO CHECK BLOOD SUGAR TWICE DAILY 100 strip 2   [START ON 03/28/2021] Amantadine HCl 100 MG tablet Take 0.5 tablets (50 mg total) by mouth 2 (two) times daily. 30 tablet 0   atorvastatin (LIPITOR) 20 MG tablet TAKE 1 TABLET(20 MG) BY MOUTH AT BEDTIME 90 tablet 0   doxycycline (VIBRAMYCIN) 50 MG capsule TAKE 1 CAPSULE(50 MG) BY MOUTH EVERY MORNING 90 capsule 0   ezetimibe (ZETIA) 10 MG tablet Take 1 tablet (10 mg total) by mouth daily. 90 tablet 0   fluticasone (FLOVENT HFA) 110 MCG/ACT inhaler Inhale 1 puff into the  lungs 2 (two) times daily. 1 Inhaler 11   gabapentin (NEURONTIN) 600 MG tablet Take 1 tablet (600 mg total) by mouth 3 (three) times daily. 270 tablet 1   hydrochlorothiazide (HYDRODIURIL) 12.5 MG tablet TAKE 1 TABLET(12.5 MG) BY MOUTH DAILY 90 tablet 0   ibuprofen (ADVIL) 200 MG tablet Take 200 mg by mouth every 6 (six) hours as needed.     insulin degludec (TRESIBA FLEXTOUCH) 100 UNIT/ML FlexTouch Pen INJECT 40 UNITS UNDER THE SKIN EVERY DAY 15 mL 5   Insulin Pen Needle (BD PEN NEEDLE NANO U/F) 32G X 4 MM MISC 1 each by Does not apply route daily. 100 each 12   MELATONIN PO Take 30 mg by mouth. Taking 1 tablet daily at night     metFORMIN (GLUCOPHAGE) 500 MG tablet Take 2 tablets (1,000 mg total) by mouth 2 (two) times daily with a meal. 360 tablet 0   NOVOTWIST 32G X 5 MM MISC USE WITH LEVEMIR PEN BID  5   omeprazole (PRILOSEC) 10 MG capsule TAKE 1 CAPSULE BY MOUTH DAILY 90 capsule 0   paliperidone (INVEGA SUSTENNA) 234 MG/1.5ML SUSY injection Inject 234 mg into the muscle every 28 (twenty-eight) days. 1.5 mL 11   perphenazine (TRILAFON) 4 MG tablet Take 1 tablet (4 mg total) by mouth at bedtime. 90 tablet 0   phenazopyridine (PYRIDIUM) 200 MG tablet Take 1 tablet (200 mg total) by mouth 3 (three) times daily as needed for pain. 10 tablet 0   pioglitazone (ACTOS) 45 MG tablet TAKE 1 TABLET(45 MG) BY MOUTH DAILY 90 tablet 0   Semaglutide,0.25 or 0.5MG /DOS, (OZEMPIC, 0.25 OR 0.5 MG/DOSE,) 2 MG/1.5ML SOPN Inject 0.375 mLs (0.5 mg total) into the skin once a week. 1 pen 5   Semaglutide,0.25 or 0.5MG /DOS, (OZEMPIC, 0.25 OR 0.5 MG/DOSE,) 2 MG/1.5ML SOPN Inject 1 mg into the skin once a week. 2 mL 2   SUPER B COMPLEX/C PO Take by mouth.  Vilazodone HCl (VIIBRYD) 40 MG TABS Take 1 tablet (40 mg total) by mouth daily. 90 tablet 1   Current Facility-Administered Medications  Medication Dose Route Frequency Provider Last Rate Last Admin   paliperidone (INVEGA SUSTENNA) injection 234 mg  234 mg  Intramuscular Q28 days Tabitha Hotter, MD   234 mg at 01/30/21 1035     Musculoskeletal: Strength & Muscle Tone:  N/A Gait & Station:  N/A Patient leans: N/A  Psychiatric Specialty Exam: Review of Systems  Psychiatric/Behavioral:  Positive for decreased concentration, dysphoric mood, hallucinations and sleep disturbance. Negative for agitation, behavioral problems, confusion, self-injury and suicidal ideas. The patient is nervous/anxious. The patient is not hyperactive.   All other systems reviewed and are negative.  There were no vitals taken for this visit.There is no height or weight on file to calculate BMI.  General Appearance: Fairly Groomed  Eye Contact:  Good  Speech:  Clear and Coherent  Volume:  Normal  Mood:  Depressed  Affect:  Appropriate, Congruent, and fatigue  Thought Process:  Coherent  Orientation:  Full (Time, Place, and Person)  Thought Content: Logical   Suicidal Thoughts:  No  Homicidal Thoughts:  No  Memory:  Immediate;   Good  Judgement:  Good  Insight:  Good  Psychomotor Activity:  Normal  Concentration:  Concentration: Good and Attention Span: Good  Recall:  Good  Fund of Knowledge: Good  Language: Good  Akathisia:  No  Handed:  Right  AIMS (if indicated): not done  Assets:  Communication Skills Desire for Improvement  ADL's:  Intact  Cognition: WNL  Sleep:  Fair   Screenings: AIMS    Flowsheet Row Office Visit from 05/12/2017 in [facility] Office Visit from 07/29/2016 in [facility] Office Visit from 05/06/2016 in [facility] Office Visit from 04[facility]01/2016 in [facility] Office Visit from 03/28/2015 in [facility]  AIMS Total Score 0 0 0 0 0      GAD-7    Flowsheet Row Office Visit from 01/07/2021 in [facility] Office Visit from 04/28/2018 in [facility]  Total GAD-7 Score 7 8       Mini-Mental    Flowsheet Row Office Visit from 09/07/2019 in [facility]  Total Score (max 30 points ) 16      PHQ2-9    Flowsheet Row Office Visit from 01/07/2021 in [facility] Video Visit from 12/26/2020 in [facility] Clinical Support from 09/03/2020 in [facility] Clinical Support from 01/19/2019 in [facility] Office Visit from 04/28/2018 in [facility]  PHQ-2 Total Score 4 1 2  0 4  PHQ-9 Total Score 9 -- 15 -- 11      Flowsheet Row Video Visit from 12/26/2020 in [facility] Video Visit from 12/05/2020 in [facility] Video Visit from 10/23/2020 in [facility]  C-SSRS RISK CATEGORY No Risk No Risk No Risk        Assessment and Plan:  RULA KENISTON is a 50 y.o. year old female with a history of schizoaffective disorder,  sleep apnea (using CPAP machine), diabetes, hypertension, hyperlipidemia, GERD, who presents for follow up appointment for below.    1. Schizoaffective disorder, depressive type (HCC) 2. Obsessive-compulsive disorder, unspecified type 3. Anxiety state She reports ongoing hallucinations and paranoia in the context of missing a monthly injection due to getting a cold.  Psychosocial stressors includes upcoming court of her husband in December.  She agrees to stay on the current dose of the medication at this time with plan to have a injection next week.  She is also advised to cut down caffeine use which can potentially worsen psychotic symptoms.  Will continue monthly IM Invega and perphenazine to target schizoaffective disorder.  Will continue Viibryd to target depression and anxiety.  Will continue gabapentin for anxiety.    # Tabitha [name] She reports occasional restlessness in her leg, which does not improve with moving.  There is no obvious tardive [name] on virtual  visit.  Noted that she has been on amantadine, which has been prescribed by her previous provider.  We will lower the dose at this time given his unclear indication.  She is advised to contact the office if any worsening in her symptoms.  Will plan to have in person visit the next time.   This clinician has discussed the side effect associated with medication prescribed during this encounter. Please refer to notes in the previous encounters for more details.     Plan 1  Continue monthly invega 234 mg IM - monitor weight gain. She is on metformin.  2. Continue perphenazine 4 mg daily 3. Continue viibryd 40 mg 4. Continue amantadine 50 mg twice a day 5. Continue gabapentin 600 mg three times a day 6. Next appointment: 12/12 at 11 AM for 30 mins, in person - She will see her PCP at [facility]   Past trials of medication: Abilify (hives), olanzapine, quetiapine, risperidone (stopped working),    The patient demonstrates the following risk factors for suicide: Chronic risk factors for suicide include: psychiatric disorder of schizoaffective disorder. Acute risk factors for suicide include: unemployment. Protective factors for this patient include: positive social support, responsibility to others (children, family), hope for the future and religious beliefs against suicide. Considering these factors, the overall suicide risk at this point appears to be low. Patient is appropriate for outpatient follow up.  Although she has guns at home, she does not have access to them.   Tabitha Hotter, MD 02/28/2021, 10:32 AM

## 2021-02-28 ENCOUNTER — Encounter: Payer: Self-pay | Admitting: Psychiatry

## 2021-02-28 ENCOUNTER — Other Ambulatory Visit: Payer: Self-pay

## 2021-02-28 ENCOUNTER — Telehealth (INDEPENDENT_AMBULATORY_CARE_PROVIDER_SITE_OTHER): Payer: Medicare Other | Admitting: Psychiatry

## 2021-02-28 DIAGNOSIS — F251 Schizoaffective disorder, depressive type: Secondary | ICD-10-CM | POA: Diagnosis not present

## 2021-02-28 DIAGNOSIS — F411 Generalized anxiety disorder: Secondary | ICD-10-CM | POA: Diagnosis not present

## 2021-02-28 DIAGNOSIS — F429 Obsessive-compulsive disorder, unspecified: Secondary | ICD-10-CM | POA: Diagnosis not present

## 2021-02-28 MED ORDER — AMANTADINE HCL 100 MG PO TABS: 50.0000 mg | ORAL_TABLET | Freq: Two times a day (BID) | ORAL | 0 refills | Status: DC

## 2021-02-28 NOTE — Patient Instructions (Signed)
1  Continue monthly invega 234 mg IM  2. Continue perphenazine 4 mg daily 3. Continue viibryd 40 mg 4. Continue amantadine 50 mg twice a day 5. Continue gabapentin 600 mg three times a day 6. Next appointment: 12/12 at 11 AM   The next visit will be in person visit. Please arrive 15 mins before the scheduled time.   [facility]  Address: [address], [place], [place] [address]

## 2021-03-05 ENCOUNTER — Other Ambulatory Visit: Payer: Self-pay

## 2021-03-05 ENCOUNTER — Ambulatory Visit (INDEPENDENT_AMBULATORY_CARE_PROVIDER_SITE_OTHER): Payer: Medicare Other

## 2021-03-05 DIAGNOSIS — F251 Schizoaffective disorder, depressive type: Secondary | ICD-10-CM | POA: Diagnosis not present

## 2021-03-05 DIAGNOSIS — F3170 Bipolar disorder, currently in remission, most recent episode unspecified: Secondary | ICD-10-CM

## 2021-03-05 NOTE — Patient Instructions (Signed)
pt was given invega sustenna 234mg /1.21ml in the upper rt out quatient.   Pt was talkative she talked about that one of her son is starting to drive.  pt appears clean. Pt states she doing well. Pt states that she been sick and now the kids are sick.    s/n # 4m exp 07-24  lot # [facility]   NDC# [facility].    Pt to return in approximately 28 days for next injection

## 2021-03-05 NOTE — Progress Notes (Signed)
pt was given invega sustenna 234mg /1.21ml in the upper rt out quatient.   Pt was talkative she talked about that one of her son is starting to drive.  pt appears clean. Pt states she doing well. Pt states that she been sick and now the kids are sick.    s/n # 4m exp 07-24  lot # [facility]   NDC# [facility].    Pt to return in approximately 28 days for next injection

## 2021-03-10 ENCOUNTER — Other Ambulatory Visit: Payer: Self-pay | Admitting: Internal Medicine

## 2021-03-10 NOTE — Telephone Encounter (Signed)
Requested Prescriptions  Pending Prescriptions Disp Refills  . hydrochlorothiazide (HYDRODIURIL) 12.5 MG tablet [Pharmacy Med Name: HYDROCHLOROTHIAZIDE 12.5MG  TABLETS] 90 tablet 1    Sig: TAKE 1 TABLET(12.5 MG) BY MOUTH DAILY     Cardiovascular: Diuretics - Thiazide Failed - 03/10/2021 11:22 AM      Failed - Na in normal range and within 360 days    Sodium  Date Value Ref Range Status  01/08/2021 134 (L) 135 - 146 mmol/L Final  09/07/2019 137 134 - 144 mmol/L Final         Failed - Last BP in normal range    BP Readings from Last 1 Encounters:  01/07/21 (!) 129/57         Passed - Ca in normal range and within 360 days    Calcium  Date Value Ref Range Status  01/08/2021 9.9 8.6 - 10.2 mg/dL Final         Passed - Cr in normal range and within 360 days    Creat  Date Value Ref Range Status  01/08/2021 0.69 0.50 - 0.99 mg/dL Final   Creatinine, Urine  Date Value Ref Range Status  01/08/2021 23 20 - 275 mg/dL Final         Passed - K in normal range and within 360 days    Potassium  Date Value Ref Range Status  01/08/2021 4.2 3.5 - 5.3 mmol/L Final         Passed - Valid encounter within last 6 months    Recent Outpatient Visits          2 months ago Influenza vaccine needed   [facility], Salvadore Oxford, NP   3 months ago COVID-19   [facility] [place], Salvadore Oxford, NP   10 months ago Encounter to establish care with new doctor   [facility], Jodelle Gross, FNP   1 year ago Acute lower UTI   [facility], Salley Hews, [place]   1 year ago Type 2 diabetes mellitus with hyperglycemia, with long-term current use of insulin [facility])   [facility], Salley Hews, [place]      Future Appointments            In 1 month [facility], Salvadore Oxford, NP [facility]   In 6 months  [facility], [facility]

## 2021-03-19 ENCOUNTER — Other Ambulatory Visit: Payer: Self-pay | Admitting: Psychiatry

## 2021-04-01 NOTE — Progress Notes (Signed)
BH MD/PA/NP OP Progress Note  04/07/2021 12:10 PM Tabitha [name]  MRN:  [id number]  Chief Complaint:  Chief Complaint   Follow-up    HPI:  This is a follow-up appointment for schizoaffective disorder.  She states that she continues to have paranoia that people at the church is trying to get her, or talking about her.  She tries to pray, trusting Jesus.  Although she does go to church regularly, she cannot get these thoughts off of her mind.  Although she knows that it does not make sense, she feels that it can happen.  She also states that she has paranoia against her family; she feels that they are trying to get her.  She feels that it is ridiculous.  She reports good relationship with her husband, and feels safe with them.  His lawyer had stroke, and the court was postponed to Feb. her husband may need to find another lawyer as this person might be getting retired next year.  She had a good Thanksgiving on her sister-in-law's place .  Although she enjoyed the time , she was slightly paranoia against this sister-in-law .  She states that this sister-in-law is her friend's friend.  She felt that she showed up as sister-in-law to do something to her. She has depressive symptoms as in PHQ-9.  Although she reports occasionally thinks that her family might be better if she were to be off dead, she adamantly denies any plan or intent as she does not want to go to hell.  She feels anxious.  She denies panic attacks.  She denies ideas of reference.  She has AH of family calling her.  She denies AH or VH.  Although she reports occasional tremors in her hand, it is not noticeable on exam.  Although she feels restless in her leg, it happens mainly when she feels anxious.  She agrees with the medication change as follows.    Wt Readings from Last 3 Encounters:  04/07/21 237 lb 3.2 oz (107.6 kg)  04/02/21 237 lb 3.2 oz (107.6 kg)  03/05/21 232 lb 9.6 oz (105.5 kg)     Daily routine: house chores, goes to  church weekly. Choir on Wed,  Exercise: Employment: unemployed, on disability due to paranoia (could not keep her job due to paranoia that people coming to her workplace). used to work at [employer] until 2007 (quit due to pregnancy) Support: husband, son, parents Household: husband, 2 sons (age 93,15) Marital status: married Number of children: 2 sons Education: college, majored in Psychologist, educational, music   Visit Diagnosis:    ICD-10-CM   1. Schizoaffective disorder, depressive type (HCC)  F25.1 Vilazodone HCl (VIIBRYD) 40 MG TABS    gabapentin (NEURONTIN) 600 MG tablet    2. Obsessive-compulsive disorder, unspecified type  F42.9 Vilazodone HCl (VIIBRYD) 40 MG TABS      Past Psychiatric History: Please see initial evaluation for full details. I have reviewed the history. No updates at this time.     Past Medical History:  Past Medical History:  Diagnosis Date   Allergic rhinitis    Anxiety    Asthma    Bipolar disorder (HCC)    Depression    Diabetes mellitus without complication (HCC)    GERD (gastroesophageal reflux disease)    Hyperlipidemia    Hypertension    Mild sleep apnea    OCD (obsessive compulsive disorder)    Palpitations    Paranoia (HCC)    Schizoaffective disorder (HCC)  Sleep apnea     Past Surgical History:  Procedure Laterality Date   CESAREAN SECTION  2006/2008   CHOLECYSTECTOMY  2006    Family Psychiatric History: Please see initial evaluation for full details. I have reviewed the history. No updates at this time.     Family History:  Family History  Problem Relation Age of Onset   Lupus Mother    Diabetes Mother        pre diabetes   Diabetes Father    Hyperlipidemia Father    Diabetes Brother    Depression Paternal Grandmother    Bipolar disorder Paternal Grandmother     Social History:  Social History   Socioeconomic History   Marital status: Married    Spouse name: kevin   Number of children: 2   Years of education: Not on file    Highest education level: Bachelor's degree (e.g., BA, AB, BS)  Occupational History   Occupation: disability  Tobacco Use   Smoking status: Never   Smokeless tobacco: Never  Vaping Use   Vaping Use: Never used  Substance and Sexual Activity   Alcohol use: No    Alcohol/week: 0.0 standard drinks   Drug use: No   Sexual activity: Yes    Partners: Male    Birth control/protection: None, Surgical  Other Topics Concern   Not on file  Social History Narrative   Not on file   Social Determinants of Health   Financial Resource Strain: Low Risk    Difficulty of Paying Living Expenses: Not hard at all  Food Insecurity: No Food Insecurity   Worried About Programme researcher, broadcasting/film/video in the Last Year: Never true   Ran Out of Food in the Last Year: Never true  Transportation Needs: No Transportation Needs   Lack of Transportation (Medical): No   Lack of Transportation (Non-Medical): No  Physical Activity: Inactive   Days of Exercise per Week: 0 days   Minutes of Exercise per Session: 0 min  Stress: Stress Concern Present   Feeling of Stress : To some extent  Social Connections: Not on file    Allergies:  Allergies  Allergen Reactions   Abilify [Aripiprazole] Hives    Metabolic Disorder Labs: Lab Results  Component Value Date   HGBA1C 7.4 (H) 01/08/2021   MPG 166 01/08/2021   Lab Results  Component Value Date   PROLACTIN 84.8 (H) 06/01/2017   PROLACTIN 36.2 (H) 03/26/2016   Lab Results  Component Value Date   CHOL 147 01/08/2021   TRIG 411 (H) 01/08/2021   HDL 49 (L) 01/08/2021   CHOLHDL 3.0 01/08/2021   VLDL 51 (H) 04/23/2017   LDLCALC  01/08/2021     Comment:     . LDL cholesterol not calculated. Triglyceride levels greater than 400 mg/dL invalidate calculated LDL results. . Reference range: <100 . Desirable range <100 mg/dL for primary prevention;   <70 mg/dL for patients with CHD or diabetic patients  with > or = 2 CHD risk factors. Marland Kitchen LDL-C is now calculated  using the Martin-Hopkins  calculation, which is a validated novel method providing  better accuracy than the Friedewald equation in the  estimation of LDL-C.  Horald Pollen et al. Lenox Ahr. 4098;119(14): 2061-2068  (http://education.[facility].com/faq/FAQ164)    LDLCALC 51 09/07/2019   Lab Results  Component Value Date   TSH 1.290 04/28/2018   TSH 2.570 06/01/2017    Therapeutic Level Labs: No results found for: LITHIUM Lab Results  Component Value Date  VALPROATE 52 10/22/2017   VALPROATE 43 (L) 06/01/2017   No components found for:  CBMZ  Current Medications: Current Outpatient Medications  Medication Sig Dispense Refill   ACCU-CHEK GUIDE test strip USE TO CHECK BLOOD SUGAR TWICE DAILY 100 strip 2   albuterol (VENTOLIN HFA) 108 (90 Base) MCG/ACT inhaler INHALE 2 PUFFS INTO THE LUNGS EVERY 6 HOURS AS NEEDED FOR WHEEZING OR SHORTNESS OF BREATH 25.5 g 3   Amantadine HCl 100 MG tablet Take 0.5 tablets (50 mg total) by mouth 2 (two) times daily. 90 tablet 0   atorvastatin (LIPITOR) 20 MG tablet TAKE 1 TABLET(20 MG) BY MOUTH AT BEDTIME 90 tablet 0   doxycycline (VIBRAMYCIN) 50 MG capsule TAKE 1 CAPSULE(50 MG) BY MOUTH EVERY MORNING 90 capsule 0   ezetimibe (ZETIA) 10 MG tablet TAKE 1 TABLET(10 MG) BY MOUTH DAILY 90 tablet 0   fluticasone (FLOVENT HFA) 110 MCG/ACT inhaler Inhale 1 puff into the lungs 2 (two) times daily. 1 Inhaler 11   hydrochlorothiazide (HYDRODIURIL) 12.5 MG tablet TAKE 1 TABLET(12.5 MG) BY MOUTH DAILY 90 tablet 1   ibuprofen (ADVIL) 200 MG tablet Take 200 mg by mouth every 6 (six) hours as needed.     insulin degludec (TRESIBA FLEXTOUCH) 100 UNIT/ML FlexTouch Pen INJECT 40 UNITS UNDER THE SKIN EVERY DAY 15 mL 5   Insulin Pen Needle (BD PEN NEEDLE NANO U/F) 32G X 4 MM MISC 1 each by Does not apply route daily. 100 each 12   MELATONIN PO Take 30 mg by mouth. Taking 1 tablet daily at night     metFORMIN (GLUCOPHAGE) 500 MG tablet TAKE 2 TABLETS(1000 MG) BY MOUTH  TWICE DAILY WITH A MEAL 360 tablet 0   NOVOTWIST 32G X 5 MM MISC USE WITH LEVEMIR PEN BID  5   omeprazole (PRILOSEC) 10 MG capsule TAKE 1 CAPSULE BY MOUTH DAILY 90 capsule 0   paliperidone (INVEGA SUSTENNA) 234 MG/1.5ML SUSY injection Inject 234 mg into the muscle every 28 (twenty-eight) days. 1.5 mL 11   perphenazine (TRILAFON) 2 MG tablet Take 1 tablet (2 mg total) by mouth at bedtime. 30 tablet 1   phenazopyridine (PYRIDIUM) 200 MG tablet Take 1 tablet (200 mg total) by mouth 3 (three) times daily as needed for pain. 10 tablet 0   pioglitazone (ACTOS) 45 MG tablet TAKE 1 TABLET(45 MG) BY MOUTH DAILY 90 tablet 0   Semaglutide,0.25 or 0.5MG /DOS, (OZEMPIC, 0.25 OR 0.5 MG/DOSE,) 2 MG/1.5ML SOPN Inject 0.375 mLs (0.5 mg total) into the skin once a week. 1 pen 5   Semaglutide,0.25 or 0.5MG /DOS, (OZEMPIC, 0.25 OR 0.5 MG/DOSE,) 2 MG/1.5ML SOPN Inject 1 mg into the skin once a week. 2 mL 2   SUPER B COMPLEX/C PO Take by mouth.     [START ON 04/16/2021] gabapentin (NEURONTIN) 600 MG tablet Take 1 tablet (600 mg total) by mouth 3 (three) times daily. 270 tablet 1   [START ON 04/23/2021] perphenazine (TRILAFON) 4 MG tablet Take 1 tablet (4 mg total) by mouth at bedtime. 90 tablet 0   [START ON 04/16/2021] Vilazodone HCl (VIIBRYD) 40 MG TABS Take 1 tablet (40 mg total) by mouth daily. 90 tablet 1   Current Facility-Administered Medications  Medication Dose Route Frequency Provider Last Rate Last Admin   paliperidone (INVEGA SUSTENNA) injection 234 mg  234 mg Intramuscular Q28 days [name], MD   234 mg at 04/02/21 1000     Musculoskeletal: Strength & Muscle Tone: within normal limits Gait & Station: normal Patient  leans: N/A  Psychiatric Specialty Exam: Review of Systems  Psychiatric/Behavioral:  Positive for decreased concentration, dysphoric mood, hallucinations, sleep disturbance and suicidal ideas. Negative for agitation, behavioral problems, confusion and self-injury. The patient is  nervous/anxious. The patient is not hyperactive.   All other systems reviewed and are negative.  Blood pressure 138/84, pulse 97, temperature 98.3 F (36.8 C), temperature source Temporal, weight 237 lb 3.2 oz (107.6 kg).Body mass index is 43.38 kg/m.  General Appearance: Fairly Groomed  Eye Contact:  Good  Speech:  Clear and Coherent  Volume:  Normal  Mood:  Depressed  Affect:  Appropriate, Congruent, and down  Thought Process:  Coherent  Orientation:  Full (Time, Place, and Person)  Thought Content: Logical and Hallucinations: Auditory Visual   Suicidal Thoughts:  Yes.  without intent/plan  Homicidal Thoughts:  No  Memory:  Immediate;   Good  Judgement:  Good  Insight:  Good  Psychomotor Activity:  Normal  Concentration:  Concentration: Good and Attention Span: Good  Recall:  Good  Fund of Knowledge: Good  Language: Good  Akathisia:  No  Handed:  Right  AIMS (if indicated): not done  Assets:  Communication Skills Desire for Improvement  ADL's:  Intact  Cognition: WNL  Sleep:  Poor   Screenings: AIMS    Flowsheet Row Office Visit from 05/12/2017 in [facility] Office Visit from 07/29/2016 in [facility] Office Visit from 05/06/2016 in [facility] Office Visit from 04/01/2016 in [facility] Office Visit from 03/28/2015 in [facility]  AIMS Total Score 0 0 0 0 0      GAD-7    Flowsheet Row Office Visit from 01/07/2021 in [facility] Office Visit from 04/28/2018 in [facility]  Total GAD-7 Score 7 8      Mini-Mental    Flowsheet Row Office Visit from 09/07/2019 in [facility]  Total Score (max 30 points ) 16      PHQ2-9    Flowsheet Row Office Visit from 04/07/2021 in [facility] Office Visit from 01/07/2021 in [facility] Video Visit from  12/26/2020 in [facility] Clinical Support from 09/03/2020 in [facility] Clinical Support from 01/19/2019 in [facility]  PHQ-2 Total Score 4 4 1 2  0  PHQ-9 Total Score 15 9 -- 15 --      Flowsheet Row Office Visit from 04/07/2021 in [facility] Video Visit from 12/26/2020 in [facility] Video Visit from 12/05/2020 in [facility]  C-SSRS RISK CATEGORY Low Risk No Risk No Risk        Assessment and Plan:  Tabitha [name] is a 50 y.o. year old female with a history of schizoaffective disorder,  sleep apnea (using CPAP machine), diabetes, hypertension, hyperlipidemia, GERD, who presents for follow up appointment for below.    1. Schizoaffective disorder, depressive type (HCC) 2. Obsessive-compulsive disorder, unspecified type There has been slight worsening in paranoia since the last visit.  Psychosocial stressors includes ongoing court of her husband/issues with his attorney due to upcoming retirement.  Will uptitrate perphenazine to optimize treatment for schizoaffective disorder.  She is also on the monthly IM; discussed potential risk of EPS, metabolic side effect.  Will continue Viibryd to target depression and anxiety.  Will continue gabapentin for anxiety.  Noted that she does not have any obvious tardive dyskinesia on exam; will  taper down amantadine to avoid polypharmacy.  She will contact the clinic if any worsening in her symptoms.   This clinician has discussed the side effect associated with medication prescribed during this encounter. Please refer to notes in the previous encounters for more details.      Plan Continue monthly invega 234 mg IM - monitor weight gain. She is on metformin.  Increase perphenazine 4 mg daily, 2 mg at night  (was taking 4 mg daily Continue Viibryd 40 mg daily Decrease amantadine 50 mg daily Continue gabapentin  600 mg 3 times a day Next appointment- 1/26 at 10:30 for 30 mins, in person - She will see her PCP at [facility] Emergency resources which includes 911, ED, suicide crisis line [phone]) are discussed.     Past trials of medication: Abilify (hives), olanzapine, quetiapine, risperidone (stopped working),   I have reviewed suicide assessment in detail. No change in the following assessment.     The patient demonstrates the following risk factors for suicide: Chronic risk factors for suicide include: psychiatric disorder of schizoaffective disorder. Acute risk factors for suicide include: unemployment. Protective factors for this patient include: positive social support, responsibility to others (children, family), hope for the future and religious beliefs against suicide. Considering these factors, the overall suicide risk at this point appears to be low. Patient is appropriate for outpatient follow up.  Although she has guns at home, she does not have access to them.   [name], MD 04/07/2021, 12:10 PM

## 2021-04-02 ENCOUNTER — Other Ambulatory Visit: Payer: Self-pay

## 2021-04-02 ENCOUNTER — Ambulatory Visit (INDEPENDENT_AMBULATORY_CARE_PROVIDER_SITE_OTHER): Payer: Medicare Other

## 2021-04-02 DIAGNOSIS — F422 Mixed obsessional thoughts and acts: Secondary | ICD-10-CM | POA: Diagnosis not present

## 2021-04-02 DIAGNOSIS — F251 Schizoaffective disorder, depressive type: Secondary | ICD-10-CM | POA: Diagnosis not present

## 2021-04-02 DIAGNOSIS — F3170 Bipolar disorder, currently in remission, most recent episode unspecified: Secondary | ICD-10-CM

## 2021-04-02 NOTE — Progress Notes (Signed)
  pt was given invega sustenna 234mg /1.63ml in the upper left out quatient.   Pt was talkative she talked about that her family was going to her family house for christmas.  pt appears clean. Pt states she doing well.     s/n # 4m exp 07-24  lot # [id number]   NDC# 05-23-1969.    Pt to return in approximately 28 days for next injection

## 2021-04-02 NOTE — Patient Instructions (Signed)
Return in 29 day for next injection

## 2021-04-05 ENCOUNTER — Other Ambulatory Visit: Payer: Self-pay | Admitting: Internal Medicine

## 2021-04-05 DIAGNOSIS — E119 Type 2 diabetes mellitus without complications: Secondary | ICD-10-CM

## 2021-04-05 DIAGNOSIS — Z794 Long term (current) use of insulin: Secondary | ICD-10-CM

## 2021-04-05 DIAGNOSIS — K219 Gastro-esophageal reflux disease without esophagitis: Secondary | ICD-10-CM

## 2021-04-05 NOTE — Telephone Encounter (Signed)
Requested Prescriptions  Pending Prescriptions Disp Refills  . omeprazole (PRILOSEC) 10 MG capsule [Pharmacy Med Name: OMEPRAZOLE 10MG CAPSULES] 90 capsule 0    Sig: TAKE 1 CAPSULE BY MOUTH DAILY     Gastroenterology: Proton Pump Inhibitors Passed - 04/05/2021  7:08 AM      Passed - Valid encounter within last 12 months    Recent Outpatient Visits          2 months ago Influenza vaccine needed   [facility], Coralie Keens, NP   4 months ago [facility] Baity, Coralie Keens, NP   11 months ago Encounter to establish care with new doctor   [facility], Lupita Raider, FNP   1 year ago Acute lower UTI   [facility], Lilia Argue, [place]   1 year ago Type 2 diabetes mellitus with hyperglycemia, with long-term current use of insulin [facility])   [facility], Lilia Argue, [place]      Future Appointments            In 1 week Garnette Gunner, Coralie Keens, NP [facility], [place]   In 5 months  [facility], [place]           . metFORMIN (GLUCOPHAGE) 500 MG tablet [Pharmacy Med Name: METFORMIN 500MG TABLETS] 360 tablet 0    Sig: TAKE 2 TABLETS(1000 MG) BY MOUTH TWICE DAILY WITH A MEAL     Endocrinology:  Diabetes - Biguanides Passed - 04/05/2021  7:08 AM      Passed - Cr in normal range and within 360 days    Creat  Date Value Ref Range Status  01/08/2021 0.69 0.50 - 0.99 mg/dL Final   Creatinine, Urine  Date Value Ref Range Status  01/08/2021 23 20 - 275 mg/dL Final         Passed - HBA1C is between 0 and 7.9 and within 180 days    HB A1C (BAYER DCA - WAIVED)  Date Value Ref Range Status  10/22/2017 6.9 <7.0 % Final    Comment:                                          Diabetic Adult            <7.0                                       Healthy Adult        4.3 - 5.7                                                           (DCCT/NGSP) American Diabetes Association's  Summary of Glycemic Recommendations for Adults with Diabetes: Hemoglobin A1c <7.0%. More stringent glycemic goals (A1c <6.0%) may further reduce complications at the cost of increased risk of hypoglycemia.    Hgb A1c MFr Bld  Date Value Ref Range Status  01/08/2021 7.4 (H) <5.7 % of total Hgb Final    Comment:    For someone without known diabetes, a hemoglobin A1c  value of 6.5% or greater indicates that they may have  diabetes and this should be confirmed with a follow-up  test. . For someone with known diabetes, a value <7% indicates  that their diabetes is well controlled and a value  greater than or equal to 7% indicates suboptimal  control. A1c targets should be individualized based on  duration of diabetes, age, comorbid conditions, and  other considerations. . Currently, no consensus exists regarding use of hemoglobin A1c for diagnosis of diabetes for children. .          Passed - eGFR in normal range and within 360 days    GFR calc Af Amer  Date Value Ref Range Status  09/07/2019 88 >59 mL/min/1.73 Final    Comment:    **[facility] currently reports eGFR in compliance with the current**   recommendations of the [employer]. [facility] will   update reporting as new guidelines are published from the NKF-ASN   Task force.    GFR calc non Af Amer  Date Value Ref Range Status  09/07/2019 76 >59 mL/min/1.73 Final   eGFR  Date Value Ref Range Status  01/08/2021 107 > OR = 60 mL/min/1.64m Final    Comment:    The eGFR is based on the CKD-EPI 2021 equation. To calculate  the new eGFR from a previous Creatinine or Cystatin C result, go to https://www.kidney.org/professionals/ kdoqi/gfr%5Fcalculator          Passed - Valid encounter within last 6 months    Recent Outpatient Visits          2 months ago [facility]enza vaccine [facility]   [facility]   [facility]    11 months ago Encounter to establish care with new doctor   [facility]   1 year ago Acute lower UTI   [facility] [place]  1 year ago Type 2 diabetes mellitus with hyperglycemia, with long-term current use of insulin ([facility]   [facility] [place]     Future Appointments            In 1 week BGarnette Gunner RCoralie Keens NP [facility] [place]  [facility]  [facility]          . ezetimibe (ZETIA) 10 MG tablet [Pharmacy Med Name: EZETIMIBE 10MG TABLETS] 90 tablet 0    Sig: TAKE 1 TABLET(10 MG) BY MOUTH DAILY     Cardiovascular:  Antilipid - Sterol Transport Inhibitors Failed - 04/05/2021  7:08 AM      Failed - LDL in normal range and within 360 days    LDL Cholesterol (Calc)  Date Value Ref Range Status  01/08/2021  mg/dL (calc) Final    Comment:    . LDL cholesterol not calculated. Triglyceride levels greater than 400 mg/dL invalidate calculated LDL results. . Reference range: <100 . Desirable range <100 mg/dL for primary prevention;   <70 mg/dL for patients with CHD or diabetic patients  with > or = 2 CHD risk factors. .Marland KitchenLDL-C is now calculated using the Martin-Hopkins  calculation, which is a validated novel method providing  better accuracy than the Friedewald equation in the  estimation of LDL-C.  MCresenciano Genreet al. JAnnamaria Helling 24782;956(21: 2061-2068  (http://education.QuestDiagnostics.com/faq/FAQ164)  Failed - HDL in normal range and within 360 days    HDL  Date Value Ref Range Status  01/08/2021 49 (L) > OR = 50 mg/dL Final  09/07/2019 55 >39 mg/dL Final         Failed - Triglycerides in normal range and within 360 days    Triglycerides  Date Value Ref Range Status  01/08/2021 411 (H) <150 mg/dL Final    Comment:    . If a non-fasting specimen was collected, consider repeat triglyceride testing on a fasting specimen if  clinically indicated.  Yates Decamp et al. J. of Clin. Lipidol. 2409;7:353-299. .    Triglycerides Piccolo,Waived  Date Value Ref Range Status  04/23/2017 253 (H) <150 mg/dL Final    Comment:                            Normal                   <150                         Borderline High     150 - 199                         High                200 - 499                         Very High                >499          Passed - Total Cholesterol in normal range and within 360 days    Cholesterol, Total  Date Value Ref Range Status  09/07/2019 138 100 - 199 mg/dL Final   Cholesterol  Date Value Ref Range Status  01/08/2021 147 <200 mg/dL Final   Cholesterol Piccolo, Waived  Date Value Ref Range Status  04/23/2017 176 <200 mg/dL Final    Comment:                            Desirable                <200                         Borderline High      200- 239                         High                     >239          Passed - Valid encounter within last 12 months    Recent Outpatient Visits          2 months ago Influenza vaccine needed   [facility] [place], Coralie Keens, NP   4 months ago [facility], Coralie Keens, NP   11 months ago Encounter to establish care with new doctor   [facility], Lupita Raider, FNP   1 year ago Acute lower UTI   [place], [place], [place]   1 year ago  Type 2 diabetes mellitus with hyperglycemia, with long-term current use of insulin [facility])   [place], Lilia Argue, [place]      Future Appointments            In 1 week Baity, Coralie Keens, NP [facility], [place]   In 5 months  [facility], [place]           . pioglitazone (ACTOS) 45 MG tablet [Pharmacy Med Name: PIOGLITAZONE 45MG TABLETS] 90 tablet 0    Sig: TAKE 1 TABLET(45 MG) BY MOUTH DAILY     Endocrinology:  Diabetes - Glitazones - pioglitazone Passed -  04/05/2021  7:08 AM      Passed - HBA1C is between 0 and 7.9 and within 180 days    HB A1C (BAYER DCA - WAIVED)  Date Value Ref Range Status  10/22/2017 6.9 <7.0 % Final    Comment:                                          Diabetic Adult            <7.0                                       Healthy Adult        4.3 - 5.7                                                           (DCCT/NGSP) American Diabetes Association's Summary of Glycemic Recommendations for Adults with Diabetes: Hemoglobin A1c <7.0%. More stringent glycemic goals (A1c <6.0%) may further reduce complications at the cost of increased risk of hypoglycemia.    Hgb A1c MFr Bld  Date Value Ref Range Status  01/08/2021 7.4 (H) <5.7 % of total Hgb Final    Comment:    For someone without known diabetes, a hemoglobin A1c value of 6.5% or greater indicates that they may have  diabetes and this should be confirmed with a follow-up  test. . For someone with known diabetes, a value <7% indicates  that their diabetes is well controlled and a value  greater than or equal to 7% indicates suboptimal  control. A1c targets should be individualized based on  duration of diabetes, age, comorbid conditions, and  other considerations. . Currently, no consensus exists regarding use of hemoglobin A1c for diagnosis of diabetes for children. Renella Cunas - Valid encounter within last 6 months    Recent Outpatient Visits          2 months ago Influenza vaccine needed   [place], NP   4 months ago [facility] [place], Coralie Keens, NP   11 months ago Encounter to establish care with new doctor   [facility], Lupita Raider, FNP   1 year ago Acute lower UTI   [facility] [place], [place]   1 year ago Type 2 diabetes mellitus with hyperglycemia, with long-term current use of  insulin [facility])   [place], Lilia Argue, [place]      Future Appointments            In 1 week Baity, Coralie Keens, NP [facility], [place]   In 5 months  [facility], [place]

## 2021-04-07 ENCOUNTER — Other Ambulatory Visit: Payer: Self-pay

## 2021-04-07 ENCOUNTER — Ambulatory Visit (INDEPENDENT_AMBULATORY_CARE_PROVIDER_SITE_OTHER): Payer: Medicare Other | Admitting: Psychiatry

## 2021-04-07 ENCOUNTER — Encounter: Payer: Self-pay | Admitting: Psychiatry

## 2021-04-07 DIAGNOSIS — F251 Schizoaffective disorder, depressive type: Secondary | ICD-10-CM | POA: Diagnosis not present

## 2021-04-07 DIAGNOSIS — F429 Obsessive-compulsive disorder, unspecified: Secondary | ICD-10-CM

## 2021-04-07 MED ORDER — PERPHENAZINE 4 MG PO TABS: 4.0000 mg | ORAL_TABLET | Freq: Every day | ORAL | 0 refills | Status: DC

## 2021-04-07 MED ORDER — GABAPENTIN 600 MG PO TABS: 600.0000 mg | ORAL_TABLET | Freq: Three times a day (TID) | ORAL | 1 refills | Status: DC

## 2021-04-07 MED ORDER — PERPHENAZINE 2 MG PO TABS: 2.0000 mg | ORAL_TABLET | Freq: Every day | ORAL | 1 refills | Status: DC

## 2021-04-07 MED ORDER — VILAZODONE HCL 40 MG PO TABS: 40.0000 mg | ORAL_TABLET | Freq: Every day | ORAL | 1 refills | Status: DC

## 2021-04-07 NOTE — Patient Instructions (Signed)
Continue monthly invega 234 mg IM  Increase perphenazine 4 mg daily, 2 mg at night   Continue Viibryd 40 mg daily Decrease amantadine 50 mg daily Continue gabapentin 600 mg 3 times a day Next appointment- 1/26 at 10:30, in person

## 2021-04-16 ENCOUNTER — Encounter: Payer: Medicare Other | Admitting: Internal Medicine

## 2021-04-16 ENCOUNTER — Other Ambulatory Visit: Payer: Medicare Other

## 2021-04-26 ENCOUNTER — Other Ambulatory Visit: Payer: Self-pay | Admitting: Psychiatry

## 2021-04-26 DIAGNOSIS — F251 Schizoaffective disorder, depressive type: Secondary | ICD-10-CM

## 2021-04-29 DIAGNOSIS — G4733 Obstructive sleep apnea (adult) (pediatric): Secondary | ICD-10-CM | POA: Diagnosis not present

## 2021-04-30 ENCOUNTER — Ambulatory Visit: Payer: Medicare Other

## 2021-05-01 ENCOUNTER — Telehealth: Payer: Self-pay

## 2021-05-01 NOTE — Telephone Encounter (Signed)
pt left a message that she believe that the medication  perphenazine  is cause her some issues and she wanted to speak with you about them.

## 2021-05-01 NOTE — Telephone Encounter (Signed)
left message to call office back.  Tried to call pt to get more info but it went to voicemail.

## 2021-05-01 NOTE — Telephone Encounter (Signed)
Left voice message as well. Please ask her more details when she calls back. Thanks!

## 2021-05-02 ENCOUNTER — Other Ambulatory Visit: Payer: Self-pay | Admitting: Psychiatry

## 2021-05-02 ENCOUNTER — Ambulatory Visit (INDEPENDENT_AMBULATORY_CARE_PROVIDER_SITE_OTHER): Payer: Medicare Other

## 2021-05-02 ENCOUNTER — Other Ambulatory Visit: Payer: Self-pay

## 2021-05-02 DIAGNOSIS — F251 Schizoaffective disorder, depressive type: Secondary | ICD-10-CM

## 2021-05-02 DIAGNOSIS — F3170 Bipolar disorder, currently in remission, most recent episode unspecified: Secondary | ICD-10-CM | POA: Diagnosis not present

## 2021-05-02 MED ORDER — OLANZAPINE 2.5 MG PO TABS: 2.5000 mg | ORAL_TABLET | Freq: Every day | ORAL | 0 refills | Status: DC

## 2021-05-02 NOTE — Patient Instructions (Addendum)
pt was given invega sustenna 234mg /1.67ml in the upper right out quatient.   pt appears clean. Pt states is not doing well. She states that she been paranoid for about a month and it been getting worse. This morning she felt as if people were following her.    Pt to return in approximately 28 days for next injection s/n # 4m exp 08-24  lot # [id number]   NDC# 03-10-1982.

## 2021-05-02 NOTE — Telephone Encounter (Signed)
pt came in for her shot she states that she not doing well she is very parnoid and she just doesn't think clearly.

## 2021-05-02 NOTE — Telephone Encounter (Signed)
Called the patient.  She states that she discontinued perphenazine few days ago as she felt her thought was swelling.  She did not have this since she discontinued perphenazine.  She also did not have any of this episode when she was on total of 6 mg of perphenazine for the past 3 weeks.  She feels paranoia that people are trying to get her.  Although she feels down, she denies any SI.  She denies AH, VH.  She sleeps 4 hours.  She had adverse reaction of hives when she was on Abilify.  She does not recall any side effect from olanzapine.  She agrees to try olanzapine instead of oral perphenazine.  She agrees to have in person visit next week.  -Discontinue oral perphenazine -Start olanzapine 2.5 mg at night.  Discussed potential risk of drowsiness, metabolic side effect, and EPS.  - She agrees to have sooner visit next week. (scheduled)

## 2021-05-07 NOTE — Progress Notes (Signed)
BH MD/PA/NP OP Progress Note  05/08/2021 11:01 AM Tabitha [name]  MRN:  [id number]  Chief Complaint:  Chief Complaint   Follow-up; Other    HPI:  This is a follow-up appointment for schizoaffective disorder and OCD.  She states that she has been doing better since being started on olanzapine.  She has not had any tongue swelling symptoms anymore.  She was able to join choir last Wednesday.  Although she still felt that people were trying to get her, it has become much less.  She has been feeling more relaxed and feels less anxious.  She reports good relationship with her family including her husband.  There is an upcoming court soon.  Although she feels anxious about it, she is able to let go.  She continues to have AH or family voice, calling her name.  It happens 4 times a day.  Although it can be bothersome at times, she has been able to handle a good.  She has initial and middle insomnia.  She has not checked CPAP machine since 2012.  She agrees to discuss it with her PCP.  Although she reports increase in appetite, she has not noticed much difference since starting olanzapine.  She denies feeling depressed since being started on olanzapine.  She denies SI.  She denies VH.  Although she tends to check often at times, it is not consuming to her anymore.  He has not noticed any difference since starting amantadine, and feels comfortable to discontinue this medication.    235 lbs is baseline Wt Readings from Last 3 Encounters:  05/08/21 239 lb (108.4 kg)  05/02/21 241 lb (109.3 kg)  04/07/21 237 lb 3.2 oz (107.6 kg)     Visit Diagnosis:    ICD-10-CM   1. Schizoaffective disorder, depressive type (HCC)  F25.1     2. Obsessive-compulsive disorder, unspecified type  F42.9       Past Psychiatric History: Please see initial evaluation for full details. I have reviewed the history. No updates at this time.     Past Medical History:  Past Medical History:  Diagnosis Date   Allergic  rhinitis    Anxiety    Asthma    Bipolar disorder (HCC)    Depression    Diabetes mellitus without complication (HCC)    GERD (gastroesophageal reflux disease)    Hyperlipidemia    Hypertension    Mild sleep apnea    OCD (obsessive compulsive disorder)    Palpitations    Paranoia (HCC)    Schizoaffective disorder (HCC)    Sleep apnea     Past Surgical History:  Procedure Laterality Date   CESAREAN SECTION  2006/2008   CHOLECYSTECTOMY  2006    Family Psychiatric History: Please see initial evaluation for full details. I have reviewed the history. No updates at this time.     Family History:  Family History  Problem Relation Age of Onset   Lupus Mother    Diabetes Mother        pre diabetes   Diabetes Father    Hyperlipidemia Father    Diabetes Brother    Depression Paternal Grandmother    Bipolar disorder Paternal Grandmother     Social History:  Social History   Socioeconomic History   Marital status: Married    Spouse name: kevin   Number of children: 2   Years of education: Not on file   Highest education level: Bachelor's degree (e.g., BA, AB, BS)  Occupational History  Occupation: disability  Tobacco Use   Smoking status: Never   Smokeless tobacco: Never  Vaping Use   Vaping Use: Never used  Substance and Sexual Activity   Alcohol use: No    Alcohol/week: 0.0 standard drinks   Drug use: No   Sexual activity: Yes    Partners: Male    Birth control/protection: None, Surgical  Other Topics Concern   Not on file  Social History Narrative   Not on file   Social Determinants of Health   Financial Resource Strain: Low Risk    Difficulty of Paying Living Expenses: Not hard at all  Food Insecurity: No Food Insecurity   Worried About Programme researcher, broadcasting/film/videounning Out of Food in the Last Year: Never true   Ran Out of Food in the Last Year: Never true  Transportation Needs: No Transportation Needs   Lack of Transportation (Medical): No   Lack of Transportation  (Non-Medical): No  Physical Activity: Inactive   Days of Exercise per Week: 0 days   Minutes of Exercise per Session: 0 min  Stress: Stress Concern Present   Feeling of Stress : To some extent  Social Connections: Not on file    Allergies:  Allergies  Allergen Reactions   Abilify [Aripiprazole] Hives    Metabolic Disorder Labs: Lab Results  Component Value Date   HGBA1C 7.4 (H) 01/08/2021   MPG 166 01/08/2021   Lab Results  Component Value Date   PROLACTIN 84.8 (H) 06/01/2017   PROLACTIN 36.2 (H) 03/26/2016   Lab Results  Component Value Date   CHOL 147 01/08/2021   TRIG 411 (H) 01/08/2021   HDL 49 (L) 01/08/2021   CHOLHDL 3.0 01/08/2021   VLDL 51 (H) 04/23/2017   LDLCALC  01/08/2021     Comment:     . LDL cholesterol not calculated. Triglyceride levels greater than 400 mg/dL invalidate calculated LDL results. . Reference range: <100 . Desirable range <100 mg/dL for primary prevention;   <70 mg/dL for patients with CHD or diabetic patients  with > or = 2 CHD risk factors. Marland Kitchen. LDL-C is now calculated using the Martin-Hopkins  calculation, which is a validated novel method providing  better accuracy than the Friedewald equation in the  estimation of LDL-C.  Horald PollenMartin SS et al. Lenox AhrJAMA. 1610;960(452013;310(19): 2061-2068  (http://education.[employer].com/faq/FAQ164)    LDLCALC 51 09/07/2019   Lab Results  Component Value Date   TSH 1.290 04/28/2018   TSH 2.570 06/01/2017    Therapeutic Level Labs: No results found for: LITHIUM Lab Results  Component Value Date   VALPROATE 52 10/22/2017   VALPROATE 43 (L) 06/01/2017   No components found for:  CBMZ  Current Medications: Current Outpatient Medications  Medication Sig Dispense Refill   ACCU-CHEK GUIDE test strip USE TO CHECK BLOOD SUGAR TWICE DAILY 100 strip 2   albuterol (VENTOLIN HFA) 108 (90 Base) MCG/ACT inhaler INHALE 2 PUFFS INTO THE LUNGS EVERY 6 HOURS AS NEEDED FOR WHEEZING OR SHORTNESS OF BREATH 25.5 g 3    atorvastatin (LIPITOR) 20 MG tablet TAKE 1 TABLET(20 MG) BY MOUTH AT BEDTIME 90 tablet 0   doxycycline (VIBRAMYCIN) 50 MG capsule TAKE 1 CAPSULE(50 MG) BY MOUTH EVERY MORNING 90 capsule 0   ezetimibe (ZETIA) 10 MG tablet TAKE 1 TABLET(10 MG) BY MOUTH DAILY 90 tablet 0   fluticasone (FLOVENT HFA) 110 MCG/ACT inhaler Inhale 1 puff into the lungs 2 (two) times daily. 1 Inhaler 11   gabapentin (NEURONTIN) 600 MG tablet Take 1 tablet (600 mg total)  by mouth 3 (three) times daily. 270 tablet 0   hydrochlorothiazide (HYDRODIURIL) 12.5 MG tablet TAKE 1 TABLET(12.5 MG) BY MOUTH DAILY 90 tablet 1   ibuprofen (ADVIL) 200 MG tablet Take 200 mg by mouth every 6 (six) hours as needed.     insulin degludec (TRESIBA FLEXTOUCH) 100 UNIT/ML FlexTouch Pen INJECT 40 UNITS UNDER THE SKIN EVERY DAY 15 mL 5   Insulin Pen Needle (BD PEN NEEDLE NANO U/F) 32G X 4 MM MISC 1 each by Does not apply route daily. 100 each 12   MELATONIN PO Take 30 mg by mouth. Taking 1 tablet daily at night     metFORMIN (GLUCOPHAGE) 500 MG tablet TAKE 2 TABLETS(1000 MG) BY MOUTH TWICE DAILY WITH A MEAL 360 tablet 0   NOVOTWIST 32G X 5 MM MISC USE WITH LEVEMIR PEN BID  5   omeprazole (PRILOSEC) 10 MG capsule TAKE 1 CAPSULE BY MOUTH DAILY 90 capsule 0   paliperidone (INVEGA SUSTENNA) 234 MG/1.5ML SUSY injection Inject 234 mg into the muscle every 28 (twenty-eight) days. 1.5 mL 11   phenazopyridine (PYRIDIUM) 200 MG tablet Take 1 tablet (200 mg total) by mouth 3 (three) times daily as needed for pain. 10 tablet 0   pioglitazone (ACTOS) 45 MG tablet TAKE 1 TABLET(45 MG) BY MOUTH DAILY 90 tablet 0   Semaglutide,0.25 or 0.5MG /DOS, (OZEMPIC, 0.25 OR 0.5 MG/DOSE,) 2 MG/1.5ML SOPN Inject 0.375 mLs (0.5 mg total) into the skin once a week. 1 pen 5   Semaglutide,0.25 or 0.5MG /DOS, (OZEMPIC, 0.25 OR 0.5 MG/DOSE,) 2 MG/1.5ML SOPN Inject 1 mg into the skin once a week. 2 mL 2   SUPER B COMPLEX/C PO Take by mouth.     Vilazodone HCl (VIIBRYD) 40 MG TABS  Take 1 tablet (40 mg total) by mouth daily. 90 tablet 1   [START ON 06/02/2021] OLANZapine (ZYPREXA) 2.5 MG tablet Take 1 tablet (2.5 mg total) by mouth at bedtime. 90 tablet 0   Current Facility-Administered Medications  Medication Dose Route Frequency Provider Last Rate Last Admin   paliperidone (INVEGA SUSTENNA) injection 234 mg  234 mg Intramuscular Q28 days [name], MD   234 mg at 05/02/21 3016     Musculoskeletal: Strength & Muscle Tone:  normal Gait & Station: normal Patient leans: N/A  Psychiatric Specialty Exam: Review of Systems  Psychiatric/Behavioral:  Positive for dysphoric mood and sleep disturbance. Negative for agitation, behavioral problems, confusion, decreased concentration, hallucinations, self-injury and suicidal ideas. The patient is nervous/anxious. The patient is not hyperactive.   All other systems reviewed and are negative.  Blood pressure (!) 149/90, pulse 87, temperature 98.3 F (36.8 C), temperature source Temporal, weight 239 lb (108.4 kg).Body mass index is 43.71 kg/m.  General Appearance: Fairly Groomed  Eye Contact:  Good  Speech:  Clear and Coherent  Volume:  Normal  Mood:   "better"  Affect:  Appropriate, Congruent, and Full Range  Thought Process:  Coherent  Orientation:  Full (Time, Place, and Person)  Thought Content: Logical   Suicidal Thoughts:  No  Homicidal Thoughts:  No  Memory:  Immediate;   Good  Judgement:  Good  Insight:  Good  Psychomotor Activity:  Normal, no tremors, no rigidity  Concentration:  Concentration: Good and Attention Span: Good  Recall:  Good  Fund of Knowledge: Good  Language: Good  Akathisia:  No  Handed:  Right  AIMS (if indicated): 0   Assets:  Communication Skills Desire for Improvement  ADL's:  Intact  Cognition: WNL  Sleep:  Fair   Screenings: AIMS    Flowsheet Row Office Visit from 05/12/2017 in [facility] Office Visit from 07/29/2016 in [facility] Office Visit from 05/06/2016 in [facility] Office Visit from 04/01/2016 in [facility] Office Visit from 03/28/2015 in [facility]  AIMS Total Score 0 0 0 0 0      GAD-7    Flowsheet Row Office Visit from 01/07/2021 in [facility] Office Visit from 04/28/2018 in [facility]  Total GAD-7 Score 7 8      Mini-Mental    Flowsheet Row Office Visit from 09/07/2019 in [facility]  Total Score (max 30 points ) 16      PHQ2-9    Flowsheet Row Office Visit from 04/07/2021 in [facility] Office Visit from 01/07/2021 in [facility] Video Visit from 12/26/2020 in [facility] Clinical Support from 09/03/2020 in [facility] Clinical Support from 01/19/2019 in [facility]  PHQ-2 Total Score 4 4 1 2  0  PHQ-9 Total Score 15 9 -- 15 --      Flowsheet Row Office Visit from 04/07/2021 in [facility] Video Visit from 12/26/2020 in [facility] Video Visit from 12/05/2020 in [facility]  C-SSRS RISK CATEGORY Low Risk No Risk No Risk        Assessment and Plan:  Tabitha [name] is a 51 y.o. year old female with a history of schizoaffective disorder,  sleep apnea (using CPAP machine), diabetes, hypertension, hyperlipidemia, GERD, who presents for follow up appointment for below.   1. Schizoaffective disorder, depressive type (HCC) There has been significant improvement in paranoia and depressive symptoms since starting olanzapine. Psychosocial stressors includes upcoming court in relate to her husband.  Will continue current dose of olanzapine to target schizoaffective disorder.  Discussed potential metabolic side effect and EPS.  Will continue Invega IM injection.  Noted that  although it is preferable to stay on one antipsychotic, she could not tolerate a higher dose of oral Invega, and has had worsening in depressive symptoms and paranoia when she is only on the Invega IM.  Will continue Viibryd to target depression and anxiety.  Will continue gabapentin for anxiety.  She has not noticed any difference since tapering down amantadine; will discontinue this medication.  No evidence of tardive dyskinesia on today's evaluation.   2. Obsessive-compulsive disorder, unspecified type Although she reports OCD traits, it is not consuming as much compared to before.  Will continue Viibryd at the current dose to target this.   This clinician has discussed the side effect associated with medication prescribed during this encounter. Please refer to notes in the previous encounters for more details.    Plan Continue monthly invega 234 mg IM - monitor weight gain. She is on metformin.  Continue olanzapine 2.5 mg at night Continue Viibryd 40 mg daily Discontinue amantadine (was on 50 mg) Continue gabapentin 600 mg 3 times a day Next appointment- 2/14 at 10 Am for 30 mins, video - She will see her PCP at [facility]   Past trials of medication: Abilify (hives), olanzapine, quetiapine, risperidone (stopped working),    The patient demonstrates the following risk factors for suicide: Chronic risk factors for suicide include: psychiatric disorder of schizoaffective disorder. Acute risk factors for suicide include: unemployment. Protective factors for this patient include: positive social support,  responsibility to others (children, family), hope for the future and religious beliefs against suicide. Considering these factors, the overall suicide risk at this point appears to be low. Patient is appropriate for outpatient follow up.  Although she has guns at home, she does not have access to them.   [name], MD 05/08/2021, 11:01 AM

## 2021-05-08 ENCOUNTER — Other Ambulatory Visit: Payer: Self-pay

## 2021-05-08 ENCOUNTER — Encounter: Payer: Self-pay | Admitting: Psychiatry

## 2021-05-08 ENCOUNTER — Ambulatory Visit (INDEPENDENT_AMBULATORY_CARE_PROVIDER_SITE_OTHER): Payer: Medicare Other | Admitting: Psychiatry

## 2021-05-08 VITALS — BP 149/90 | HR 87 | Temp 98.3°F | Wt 239.0 lb

## 2021-05-08 DIAGNOSIS — F429 Obsessive-compulsive disorder, unspecified: Secondary | ICD-10-CM

## 2021-05-08 DIAGNOSIS — F251 Schizoaffective disorder, depressive type: Secondary | ICD-10-CM

## 2021-05-08 MED ORDER — OLANZAPINE 2.5 MG PO TABS: 2.5000 mg | ORAL_TABLET | Freq: Every day | ORAL | 0 refills | Status: DC

## 2021-05-08 NOTE — Patient Instructions (Signed)
Continue monthly invega 234 mg IM  Continue olanzapine 2.5 mg at night Continue Viibryd 40 mg daily Discontinue amantadine  Continue gabapentin 600 mg 3 times a day Next appointment- 2/14 at 10 Am

## 2021-05-19 ENCOUNTER — Ambulatory Visit: Payer: Medicare Other | Admitting: Psychiatry

## 2021-05-21 ENCOUNTER — Encounter: Payer: Self-pay | Admitting: Internal Medicine

## 2021-05-21 ENCOUNTER — Other Ambulatory Visit: Payer: Self-pay

## 2021-05-21 ENCOUNTER — Ambulatory Visit (INDEPENDENT_AMBULATORY_CARE_PROVIDER_SITE_OTHER): Payer: Medicare Other | Admitting: Internal Medicine

## 2021-05-21 VITALS — BP 132/61 | HR 85 | Temp 98.2°F | Ht 62.0 in | Wt 241.0 lb

## 2021-05-21 DIAGNOSIS — D649 Anemia, unspecified: Secondary | ICD-10-CM | POA: Diagnosis not present

## 2021-05-21 DIAGNOSIS — Z1159 Encounter for screening for other viral diseases: Secondary | ICD-10-CM

## 2021-05-21 DIAGNOSIS — E119 Type 2 diabetes mellitus without complications: Secondary | ICD-10-CM | POA: Diagnosis not present

## 2021-05-21 DIAGNOSIS — J029 Acute pharyngitis, unspecified: Secondary | ICD-10-CM | POA: Diagnosis not present

## 2021-05-21 DIAGNOSIS — J069 Acute upper respiratory infection, unspecified: Secondary | ICD-10-CM | POA: Diagnosis not present

## 2021-05-21 DIAGNOSIS — Z23 Encounter for immunization: Secondary | ICD-10-CM | POA: Diagnosis not present

## 2021-05-21 DIAGNOSIS — K219 Gastro-esophageal reflux disease without esophagitis: Secondary | ICD-10-CM | POA: Diagnosis not present

## 2021-05-21 DIAGNOSIS — Z0001 Encounter for general adult medical examination with abnormal findings: Secondary | ICD-10-CM

## 2021-05-21 LAB — POCT RAPID STREP A (OFFICE): Rapid Strep A Screen: NEGATIVE

## 2021-05-21 MED ORDER — KETOROLAC TROMETHAMINE 60 MG/2ML IM SOLN
30.0000 mg | Freq: Once | INTRAMUSCULAR | Status: AC
  Administered 2021-05-21: 30 mg via INTRAMUSCULAR

## 2021-05-21 NOTE — Progress Notes (Signed)
Subjective:    Patient ID: Tabitha [name], female    DOB: 10-Nov-1970, 51 y.o.   MRN: [id number]  HPI  Patient presents the clinic today for her annual exam.  Pt reports fatigue, runny nose and sore throat. She reports this started 3 days ago. She is not blowing anything out of her nose. She is having some difficulty swallowing.  She denies headache, nasal congestion, ear pain, cough, shortness of breath, chest pain, nausea, vomiting or diarrhea.  She denies fever or chills but has had body aches.  She has tried ibuprofen OTC with minimal relief of symptoms.  She has had sick contacts with similar symptoms.  She has not tested for COVID.  She has not had her COVID vaccines.  Flu: 12/2020 Tetanus: 12/2014 COVID: never Pneumovax: 04/2015 Pap smear: 12/2014 Mammogram: 04/2011 Colon screening: never Vision screening: as needed Dentist: as needed  Diet: She does eat meat. She consumes some fruits and veggies. She does eat fried foods. She drinks mostly coffee, hot tea and dt. soda Exercise: None  Review of Systems     Past Medical History:  Diagnosis Date   Allergic rhinitis    Anxiety    Asthma    Bipolar disorder ([place])    Depression    Diabetes mellitus without complication (HCC)    GERD (gastroesophageal reflux disease)    Hyperlipidemia    Hypertension    Mild sleep apnea    OCD (obsessive compulsive disorder)    Palpitations    Paranoia (HCC)    Schizoaffective disorder (HCC)    Sleep apnea     Current Outpatient Medications  Medication Sig Dispense Refill   ACCU-CHEK GUIDE test strip USE TO CHECK BLOOD SUGAR TWICE DAILY 100 strip 2   albuterol (VENTOLIN HFA) 108 (90 Base) MCG/ACT inhaler INHALE 2 PUFFS INTO THE LUNGS EVERY 6 HOURS AS NEEDED FOR WHEEZING OR SHORTNESS OF BREATH 25.5 g 3   atorvastatin (LIPITOR) 20 MG tablet TAKE 1 TABLET(20 MG) BY MOUTH AT BEDTIME 90 tablet 0   doxycycline (VIBRAMYCIN) 50 MG capsule TAKE 1 CAPSULE(50 MG) BY MOUTH EVERY MORNING 90  capsule 0   ezetimibe (ZETIA) 10 MG tablet TAKE 1 TABLET(10 MG) BY MOUTH DAILY 90 tablet 0   fluticasone (FLOVENT HFA) 110 MCG/ACT inhaler Inhale 1 puff into the lungs 2 (two) times daily. 1 Inhaler 11   gabapentin (NEURONTIN) 600 MG tablet Take 1 tablet (600 mg total) by mouth 3 (three) times daily. 270 tablet 0   hydrochlorothiazide (HYDRODIURIL) 12.5 MG tablet TAKE 1 TABLET(12.5 MG) BY MOUTH DAILY 90 tablet 1   ibuprofen (ADVIL) 200 MG tablet Take 200 mg by mouth every 6 (six) hours as needed.     insulin degludec (TRESIBA FLEXTOUCH) 100 UNIT/ML FlexTouch Pen INJECT 40 UNITS UNDER THE SKIN EVERY DAY 15 mL 5   Insulin Pen Needle (BD PEN NEEDLE NANO U/F) 32G X 4 MM MISC 1 each by Does not apply route daily. 100 each 12   MELATONIN PO Take 30 mg by mouth. Taking 1 tablet daily at night     metFORMIN (GLUCOPHAGE) 500 MG tablet TAKE 2 TABLETS(1000 MG) BY MOUTH TWICE DAILY WITH A MEAL 360 tablet 0   NOVOTWIST 32G X 5 MM MISC USE WITH LEVEMIR PEN BID  5   [START ON 06/02/2021] OLANZapine (ZYPREXA) 2.5 MG tablet Take 1 tablet (2.5 mg total) by mouth at bedtime. 90 tablet 0   omeprazole (PRILOSEC) 10 MG capsule TAKE 1 CAPSULE BY MOUTH  DAILY 90 capsule 0   paliperidone (INVEGA SUSTENNA) 234 MG/1.5ML SUSY injection Inject 234 mg into the muscle every 28 (twenty-eight) days. 1.5 mL 11   phenazopyridine (PYRIDIUM) 200 MG tablet Take 1 tablet (200 mg total) by mouth 3 (three) times daily as needed for pain. 10 tablet 0   pioglitazone (ACTOS) 45 MG tablet TAKE 1 TABLET(45 MG) BY MOUTH DAILY 90 tablet 0   Semaglutide,0.25 or 0.5MG/DOS, (OZEMPIC, 0.25 OR 0.5 MG/DOSE,) 2 MG/1.5ML SOPN Inject 0.375 mLs (0.5 mg total) into the skin once a week. 1 pen 5   Semaglutide,0.25 or 0.5MG/DOS, (OZEMPIC, 0.25 OR 0.5 MG/DOSE,) 2 MG/1.5ML SOPN Inject 1 mg into the skin once a week. 2 mL 2   SUPER B COMPLEX/C PO Take by mouth.     Vilazodone HCl (VIIBRYD) 40 MG TABS Take 1 tablet (40 mg total) by mouth daily. 90 tablet 1    Current Facility-Administered Medications  Medication Dose Route Frequency Provider Last Rate Last Admin   paliperidone (INVEGA SUSTENNA) injection 234 mg  234 mg Intramuscular Q28 days Norman Clay, MD   234 mg at 05/02/21 0867    Allergies  Allergen Reactions   Abilify [Aripiprazole] Hives    Family History  Problem Relation Age of Onset   Lupus Mother    Diabetes Mother        pre diabetes   Diabetes Father    Hyperlipidemia Father    Diabetes Brother    Depression Paternal Grandmother    Bipolar disorder Paternal Grandmother     Social History   Socioeconomic History   Marital status: Married    Spouse name: Brynne Doane   Number of children: 2   Years of education: 16   Highest education level: Bachelor's degree (e.g., BA, AB, BS)  Occupational History   Occupation: disability  Tobacco Use   Smoking status: Never   Smokeless tobacco: Never  Vaping Use   Vaping Use: Never used  Substance and Sexual Activity   Alcohol use: Not Currently   Drug use: Never   Sexual activity: Yes    Partners: Male    Birth control/protection: Surgical  Other Topics Concern   Not on file  Social History Narrative   Not on file   Social Determinants of Health   Financial Resource Strain: Low Risk    Difficulty of Paying Living Expenses: Not hard at all  Food Insecurity: No Food Insecurity   Worried About Charity fundraiser in the Last Year: Never true   [place] in the Last Year: Never true  Transportation Needs: No Transportation Needs   Lack of Transportation (Medical): No   Lack of Transportation (Non-Medical): No  Physical Activity: Inactive   Days of Exercise per Week: 0 days   Minutes of Exercise per Session: 0 min  Stress: Stress Concern Present   Feeling of Stress : To some extent  Social Connections: Not on file  Intimate Partner Violence: Not on file     Constitutional: Patient reports intermittent headaches.  Denies fever, malaise, fatigue, or  abrupt weight changes.  HEENT: Pt reports sore throat. Denies eye pain, eye redness, ear pain, ringing in the ears, wax buildup, runny nose, nasal congestion, bloody nose. Respiratory: Denies difficulty breathing, shortness of breath, cough or sputum production.   Cardiovascular: Pt reports swelling in legs. Denies chest pain, chest tightness, palpitations or swelling in the hands.  Gastrointestinal: Patient reports intermittent diarrhea.  Denies abdominal pain, bloating, constipation,  or blood  in the stool.  GU: Pt reports urinary frequency. Denies urgency, pain with urination, burning sensation, blood in urine, odor or discharge. Musculoskeletal: Patient reports body aches.  Denies decrease in range of motion, difficulty with gait, or joint pain and swelling.  Skin: Denies redness, rashes, lesions or ulcercations.  Neurological: Pt reports numbness in upper and lower extremities. Denies dizziness, difficulty with memory, difficulty with speech or problems with balance and coordination.  Psych: Patient has a history of depression.  Denies anxiety, SI/HI.  No other specific complaints in a complete review of systems (except as listed in HPI above).  Objective:   Physical Exam  BP 132/61 (BP Location: Right Arm, Patient Position: Sitting, Cuff Size: Large)    Pulse 85    Temp 98.2 F (36.8 C) (Oral)    Ht '5\' 2"'  (1.575 m)    Wt 241 lb (109.3 kg)    SpO2 100%    BMI 44.08 kg/m   Wt Readings from Last 3 Encounters:  01/07/21 240 lb 9.6 oz (109.1 kg)  09/03/20 242 lb (109.8 kg)  04/15/20 244 lb 3.2 oz (110.8 kg)    General: Appears her stated age, obese, in NAD. Skin: Warm, dry and intact. No rashes or ulcerations noted. HEENT: Head: normal shape and size; Eyes: sclera white and EOMs intact;  Throat/Mouth: Teeth present, mucosa erythematous and moist, no exudate, lesions or ulcerations noted.  Neck:  Neck supple, trachea midline. No masses, lumps or thyromegaly present.  Cardiovascular:  Normal rate and rhythm. S1,S2 noted.  No murmur, rubs or gallops noted. No JVD or BLE edema. No carotid bruits noted. Pulmonary/Chest: Normal effort and positive vesicular breath sounds. No respiratory distress. No wheezes, rales or ronchi noted.  Abdomen: Soft and nontender. Normal bowel sounds.  Musculoskeletal: Strength 5/5 BUE/BLE.  No difficulty with gait.  Neurological: Alert and oriented. Cranial nerves II-XII grossly intact. Coordination normal.  Psychiatric: Mood and affect normal. Behavior is normal. Judgment and thought content normal.    BMET    Component Value Date/Time   NA 134 (L) 01/08/2021 0802   NA 137 09/07/2019 1657   K 4.2 01/08/2021 0802   CL 96 (L) 01/08/2021 0802   CO2 29 01/08/2021 0802   GLUCOSE 288 (H) 01/08/2021 0802   BUN 11 01/08/2021 0802   BUN 14 09/07/2019 1657   CREATININE 0.69 01/08/2021 0802   CALCIUM 9.9 01/08/2021 0802   GFRNONAA 76 09/07/2019 1657   GFRAA 88 09/07/2019 1657    Lipid Panel     Component Value Date/Time   CHOL 147 01/08/2021 0802   CHOL 138 09/07/2019 1657   CHOL 176 04/23/2017 1341   TRIG 411 (H) 01/08/2021 0802   TRIG 253 (H) 04/23/2017 1341   HDL 49 (L) 01/08/2021 0802   HDL 55 09/07/2019 1657   CHOLHDL 3.0 01/08/2021 0802   VLDL 51 (H) 04/23/2017 1341   LDLCALC  01/08/2021 0802     Comment:     . LDL cholesterol not calculated. Triglyceride levels greater than 400 mg/dL invalidate calculated LDL results. . Reference range: <100 . Desirable range <100 mg/dL for primary prevention;   <70 mg/dL for patients with CHD or diabetic patients  with > or = 2 CHD risk factors. Marland Kitchen LDL-C is now calculated using the Martin-Hopkins  calculation, which is a validated novel method providing  better accuracy than the Friedewald equation in the  estimation of LDL-C.  Cresenciano Genre et al. Annamaria Helling. 7902;409(73): 2061-2068  (http://education.[facility].com/faq/FAQ164)  CBC    Component Value Date/Time   WBC 5.3  01/08/2021 0802   RBC 3.97 01/08/2021 0802   HGB 11.9 01/08/2021 0802   HGB 13.3 04/28/2018 1604   HCT 36.5 01/08/2021 0802   HCT 39.6 04/28/2018 1604   PLT 169 01/08/2021 0802   PLT 201 04/28/2018 1604   MCV 91.9 01/08/2021 0802   MCV 86 04/28/2018 1604   MCH 30.0 01/08/2021 0802   MCHC 32.6 01/08/2021 0802   RDW 14.4 01/08/2021 0802   RDW 15.1 04/28/2018 1604   LYMPHSABS 2.4 04/28/2018 1604   MONOABS 378 09/26/2015 1154   EOSABS 0.1 04/28/2018 1604   BASOSABS 0.0 04/28/2018 1604    Hgb A1C Lab Results  Component Value Date   HGBA1C 7.4 (H) 01/08/2021            Assessment & Plan:   Preventative Health Maintenance:  Flu shot UTD Tetanus UTD Encouraged her to get her COVID vaccine  Pneumovax today She reports she will call back to schedule her Pap smear She can times order for mammogram today She declines referral to GI for screening colonoscopy or Cologuard at this time Encouraged her to consume about balanced diet and exercise regimen Advised her to see an eye doctor and dentist annually We will check CBC, c-Met, lipid, A1c and hep C today  Runny Nose, Sore Throat:  Likely viral given that other members in her household have had the same thing Rapid strep negative Will test for COVID Toradol 30 mg IM today Advised her to start Zyrtec OTC  RTC in 6 months, follow-up chronic conditions Webb Silversmith, NP This visit occurred during the SARS-CoV-2 public health emergency.  Safety protocols were in place, including screening questions prior to the visit, additional usage of staff PPE, and extensive cleaning of exam room while observing appropriate contact time as indicated for disinfecting solutions.

## 2021-05-21 NOTE — Assessment & Plan Note (Signed)
Encourage diet and exercise for weight loss 

## 2021-05-21 NOTE — Patient Instructions (Signed)

## 2021-05-22 ENCOUNTER — Ambulatory Visit: Payer: Medicare Other | Admitting: Psychiatry

## 2021-05-22 LAB — CBC
HCT: 35.9 % (ref 35.0–45.0)
Hemoglobin: 12 g/dL (ref 11.7–15.5)
MCH: 29.9 pg (ref 27.0–33.0)
MCHC: 33.4 g/dL (ref 32.0–36.0)
MCV: 89.3 fL (ref 80.0–100.0)
MPV: 10.8 fL (ref 7.5–12.5)
Platelets: 186 10*3/uL (ref 140–400)
RBC: 4.02 10*6/uL (ref 3.80–5.10)
RDW: 13.8 % (ref 11.0–15.0)
WBC: 6 10*3/uL (ref 3.8–10.8)

## 2021-05-22 LAB — COMPLETE METABOLIC PANEL WITH GFR
AG Ratio: 1.9 (calc) (ref 1.0–2.5)
ALT: 18 U/L (ref 6–29)
AST: 14 U/L (ref 10–35)
Albumin: 4.1 g/dL (ref 3.6–5.1)
Alkaline phosphatase (APISO): 90 U/L (ref 31–125)
BUN: 14 mg/dL (ref 7–25)
CO2: 29 mmol/L (ref 20–32)
Calcium: 9.4 mg/dL (ref 8.6–10.2)
Chloride: 103 mmol/L (ref 98–110)
Creat: 0.67 mg/dL (ref 0.50–0.99)
Globulin: 2.2 g/dL (calc) (ref 1.9–3.7)
Glucose, Bld: 158 mg/dL — ABNORMAL HIGH (ref 65–99)
Potassium: 4.1 mmol/L (ref 3.5–5.3)
Sodium: 138 mmol/L (ref 135–146)
Total Bilirubin: 0.4 mg/dL (ref 0.2–1.2)
Total Protein: 6.3 g/dL (ref 6.1–8.1)
eGFR: 107 mL/min/{1.73_m2} (ref >=60)

## 2021-05-22 LAB — LIPID PANEL
Cholesterol: 140 mg/dL (ref <200)
HDL: 52 mg/dL (ref >=50)
LDL Cholesterol (Calc): 59 mg/dL (calc)
Non-HDL Cholesterol (Calc): 88 mg/dL (calc) (ref <130)
Total CHOL/HDL Ratio: 2.7 (calc) (ref <5.0)
Triglycerides: 236 mg/dL — ABNORMAL HIGH (ref <150)

## 2021-05-22 LAB — HEPATITIS C ANTIBODY
Hepatitis C Ab: NONREACTIVE
SIGNAL TO CUT-OFF: 0.04 (ref <1.00)

## 2021-05-22 LAB — SARS-COV-2, NAA 2 DAY TAT

## 2021-05-22 LAB — NOVEL CORONAVIRUS, NAA: SARS-CoV-2, NAA: NOT DETECTED

## 2021-05-22 LAB — HEMOGLOBIN A1C
Hgb A1c MFr Bld: 7.6 % of total Hgb — ABNORMAL HIGH (ref <5.7)
Mean Plasma Glucose: 171 mg/dL
eAG (mmol/L): 9.5 mmol/L

## 2021-05-22 LAB — SPECIMEN STATUS REPORT

## 2021-05-25 ENCOUNTER — Encounter: Payer: Self-pay | Admitting: Internal Medicine

## 2021-05-26 NOTE — Telephone Encounter (Signed)
Duplicate message.....  See pt's MyChart message.    Thanks,   -Vernona Rieger

## 2021-06-03 ENCOUNTER — Ambulatory Visit: Payer: Medicare Other

## 2021-06-05 ENCOUNTER — Ambulatory Visit (INDEPENDENT_AMBULATORY_CARE_PROVIDER_SITE_OTHER): Payer: Medicare Other

## 2021-06-05 ENCOUNTER — Other Ambulatory Visit: Payer: Self-pay

## 2021-06-05 DIAGNOSIS — F251 Schizoaffective disorder, depressive type: Secondary | ICD-10-CM

## 2021-06-05 DIAGNOSIS — F3170 Bipolar disorder, currently in remission, most recent episode unspecified: Secondary | ICD-10-CM | POA: Diagnosis not present

## 2021-06-05 NOTE — Progress Notes (Signed)
pt was given invega sustenna 234mg /1.44ml in the upper left out quatient.   pt appears clean. Pt states is not doing well. She states that she been sick for over a week and she seen a difference in her mood.     Pt to return in approximately 28 days for next injection s/n # [id number] exp 08-24  lot # [id number]   NDC# [id number].      Patient to return in 28 day for next injection.

## 2021-06-06 NOTE — Progress Notes (Addendum)
Virtual Visit via Video Note  I connected with Tabitha [name] on 06/10/21 at 10:00 AM EST by a video enabled telemedicine application and verified that I am speaking with the correct person using two identifiers.  Location: Patient: home Provider: office Persons participated in the visit- patient, provider    I discussed the limitations of evaluation and management by telemedicine and the availability of in person appointments. The patient expressed understanding and agreed to proceed.    I discussed the assessment and treatment plan with the patient. The patient was provided an opportunity to ask questions and all were answered. The patient agreed with the plan and demonstrated an understanding of the instructions.   The patient was advised to call back or seek an in-person evaluation if the symptoms worsen or if the condition fails to improve as anticipated.  I provided 16 minutes of non-face-to-face time during this encounter.   Tabitha Hotter, MD   [facility] MD/PA/NP OP Progress Note  06/10/2021 10:30 AM Tabitha [name]  MRN:  [id number]  Chief Complaint:  Chief Complaint   Follow-up; Other    HPI:  This is a follow-up appointment for schizoaffective disorder.  She states that her mood is "up and down."  She experienced a brief moment of feeling like "high," although it lasted only up to an hour.  She tends to feel depressed otherwise.  Her husband's attorney could not continue the case due to stroke.  He will have a meeting with a new attorney in the several days.  Although both of them are feeling stressed, she reports good connection with her husband.  She continues to have paranoia that people in the church are watching her.  She also had an episode of her feeling that people are out there watching her when she was sitting in the dining table yesterday.  Although she tries to ignore, she was stressed by this.  She sleeps 5 to 6 hours.  She has not contacted her PCP about evaluation  of sleep.  She tends to eat more as she feels stressed.  Although she reports passive SI, she denies any plan or intent.  She agrees to contact emergency resources if any worsening.  She has AH of calling her name.  She denies VH.  She has not noticed any difference since tapering off amantadine, and feels comfortable to stay off this medication.  She is willing to try higher dose of olanzapine at this time.   Daily routine: house chores, goes to church weekly. Choir on Wed,  Exercise: Employment: unemployed, on disability due to paranoia (could not keep her job due to paranoia that people coming to her workplace). used to work at [employer] until 2007 (quit due to pregnancy) Support: husband, son, parents Household: husband, 2 sons (age 70,15) Marital status: married Number of children: 2 sons Education: college, majored in Psychologist, educational, music  242 lbs Wt Readings from Last 3 Encounters:  06/05/21 240 lb 9.6 oz (109.1 kg)  05/21/21 241 lb (109.3 kg)  05/08/21 239 lb (108.4 kg)      Visit Diagnosis:    ICD-10-CM   1. Schizoaffective disorder, depressive type (HCC)  F25.1     2. Obsessive-compulsive disorder, unspecified type  F42.9       Past Psychiatric History: Please see initial evaluation for full details. I have reviewed the history. No updates at this time.     Past Medical History:  Past Medical History:  Diagnosis Date   Allergic rhinitis  Anxiety    Asthma    Bipolar disorder (HCC)    Depression    Diabetes mellitus without complication (HCC)    GERD (gastroesophageal reflux disease)    Hyperlipidemia    Hypertension    Mild sleep apnea    OCD (obsessive compulsive disorder)    Palpitations    Paranoia (HCC)    Schizoaffective disorder (HCC)    Sleep apnea     Past Surgical History:  Procedure Laterality Date   CESAREAN SECTION  2006/2008   CHOLECYSTECTOMY  2006    Family Psychiatric History: Please see initial evaluation for full details. I have reviewed the  history. No updates at this time.     Family History:  Family History  Problem Relation Age of Onset   Lupus Mother    Diabetes Mother        pre diabetes   Diabetes Father    Hyperlipidemia Father    Diabetes Brother    Depression Paternal Grandmother    Bipolar disorder Paternal Grandmother     Social History:  Social History   Socioeconomic History   Marital status: Married    Spouse name: Karlisha Mathena   Number of children: 2   Years of education: 16   Highest education level: Bachelor's degree (e.g., BA, AB, BS)  Occupational History   Occupation: disability  Tobacco Use   Smoking status: Never   Smokeless tobacco: Never  Vaping Use   Vaping Use: Never used  Substance and Sexual Activity   Alcohol use: Not Currently   Drug use: Never   Sexual activity: Yes    Partners: Male    Birth control/protection: Surgical  Other Topics Concern   Not on file  Social History Narrative   Not on file   Social Determinants of Health   Financial Resource Strain: Low Risk    Difficulty of Paying Living Expenses: Not hard at all  Food Insecurity: No Food Insecurity   Worried About Programme researcher, broadcasting/film/video in the Last Year: Never true   Ran Out of Food in the Last Year: Never true  Transportation Needs: No Transportation Needs   Lack of Transportation (Medical): No   Lack of Transportation (Non-Medical): No  Physical Activity: Inactive   Days of Exercise per Week: 0 days   Minutes of Exercise per Session: 0 min  Stress: Stress Concern Present   Feeling of Stress : To some extent  Social Connections: Not on file    Allergies:  Allergies  Allergen Reactions   Abilify [Aripiprazole] Hives    Metabolic Disorder Labs: Lab Results  Component Value Date   HGBA1C 7.6 (H) 05/21/2021   MPG 171 05/21/2021   MPG 166 01/08/2021   Lab Results  Component Value Date   PROLACTIN 84.8 (H) 06/01/2017   PROLACTIN 36.2 (H) 03/26/2016   Lab Results  Component Value Date   CHOL  140 05/21/2021   TRIG 236 (H) 05/21/2021   HDL 52 05/21/2021   CHOLHDL 2.7 05/21/2021   VLDL 51 (H) 04/23/2017   LDLCALC 59 05/21/2021   LDLCALC  01/08/2021     Comment:     . LDL cholesterol not calculated. Triglyceride levels greater than 400 mg/dL invalidate calculated LDL results. . Reference range: <100 . Desirable range <100 mg/dL for primary prevention;   <70 mg/dL for patients with CHD or diabetic patients  with > or = 2 CHD risk factors. Marland Kitchen LDL-C is now calculated using the Martin-Hopkins  calculation, which is a  validated novel method providing  better accuracy than the Friedewald equation in the  estimation of LDL-C.  Horald PollenMartin SS et al. Lenox AhrJAMA. 1308;657(842013;310(19): 2061-2068  (http://education.[employer].com/faq/FAQ164)    Lab Results  Component Value Date   TSH 1.290 04/28/2018   TSH 2.570 06/01/2017    Therapeutic Level Labs: No results found for: LITHIUM Lab Results  Component Value Date   VALPROATE 52 10/22/2017   VALPROATE 43 (L) 06/01/2017   No components found for:  CBMZ  Current Medications: Current Outpatient Medications  Medication Sig Dispense Refill   ACCU-CHEK GUIDE test strip USE TO CHECK BLOOD SUGAR TWICE DAILY 100 strip 2   albuterol (VENTOLIN HFA) 108 (90 Base) MCG/ACT inhaler INHALE 2 PUFFS INTO THE LUNGS EVERY 6 HOURS AS NEEDED FOR WHEEZING OR SHORTNESS OF BREATH 25.5 g 3   atorvastatin (LIPITOR) 20 MG tablet TAKE 1 TABLET(20 MG) BY MOUTH AT BEDTIME 90 tablet 0   doxycycline (VIBRAMYCIN) 50 MG capsule TAKE 1 CAPSULE(50 MG) BY MOUTH EVERY MORNING 90 capsule 0   ezetimibe (ZETIA) 10 MG tablet TAKE 1 TABLET(10 MG) BY MOUTH DAILY 90 tablet 0   fluticasone (FLOVENT HFA) 110 MCG/ACT inhaler Inhale 1 puff into the lungs 2 (two) times daily. 1 Inhaler 11   gabapentin (NEURONTIN) 600 MG tablet Take 1 tablet (600 mg total) by mouth 3 (three) times daily. 270 tablet 0   hydrochlorothiazide (HYDRODIURIL) 12.5 MG tablet TAKE 1 TABLET(12.5 MG) BY MOUTH  DAILY 90 tablet 1   ibuprofen (ADVIL) 200 MG tablet Take 200 mg by mouth every 6 (six) hours as needed.     insulin degludec (TRESIBA FLEXTOUCH) 100 UNIT/ML FlexTouch Pen INJECT 40 UNITS UNDER THE SKIN EVERY DAY 15 mL 5   Insulin Pen Needle (BD PEN NEEDLE NANO U/F) 32G X 4 MM MISC 1 each by Does not apply route daily. 100 each 12   MELATONIN PO Take 5 mg by mouth at bedtime as needed.     metFORMIN (GLUCOPHAGE) 500 MG tablet TAKE 2 TABLETS(1000 MG) BY MOUTH TWICE DAILY WITH A MEAL 360 tablet 0   NOVOTWIST 32G X 5 MM MISC USE WITH LEVEMIR PEN BID  5   OLANZapine (ZYPREXA) 2.5 MG tablet Take 1 tablet (2.5 mg total) by mouth at bedtime. 90 tablet 0   omeprazole (PRILOSEC) 10 MG capsule TAKE 1 CAPSULE BY MOUTH DAILY 90 capsule 0   paliperidone (INVEGA SUSTENNA) 234 MG/1.5ML SUSY injection Inject 234 mg into the muscle every 28 (twenty-eight) days. 1.5 mL 11   phenazopyridine (PYRIDIUM) 200 MG tablet Take 1 tablet (200 mg total) by mouth 3 (three) times daily as needed for pain. 10 tablet 0   pioglitazone (ACTOS) 45 MG tablet TAKE 1 TABLET(45 MG) BY MOUTH DAILY 90 tablet 0   Semaglutide,0.25 or 0.5MG /DOS, (OZEMPIC, 0.25 OR 0.5 MG/DOSE,) 2 MG/1.5ML SOPN Inject 1 mg into the skin once a week. (Patient taking differently: Inject 1.5 mg into the skin once a week.) 2 mL 2   SUPER B COMPLEX/C PO Take 1 tablet by mouth daily.     Vilazodone HCl (VIIBRYD) 40 MG TABS Take 1 tablet (40 mg total) by mouth daily. 90 tablet 1   No current facility-administered medications for this visit.     Musculoskeletal: Strength & Muscle Tone:  N/A Gait & Station:  N/A Patient leans: N/A  Psychiatric Specialty Exam: Review of Systems  Psychiatric/Behavioral:  Positive for decreased concentration, dysphoric mood, sleep disturbance and suicidal ideas. Negative for agitation, behavioral problems, confusion, hallucinations and  self-injury. The patient is nervous/anxious. The patient is not hyperactive.   All other systems  reviewed and are negative.  There were no vitals taken for this visit.There is no height or weight on file to calculate BMI.  General Appearance: Fairly Groomed  Eye Contact:  Good  Speech:  Clear and Coherent  Volume:  Normal  Mood:  Depressed  Affect:  Appropriate, Congruent, and slightly down  Thought Process:  Coherent  Orientation:  Full (Time, Place, and Person)  Thought Content: Logical   Suicidal Thoughts:  Yes.  without intent/plan  Homicidal Thoughts:  No  Memory:  Immediate;   Good  Judgement:  Good  Insight:  Good  Psychomotor Activity:  Normal  Concentration:  Concentration: Good and Attention Span: Good  Recall:  Good  Fund of Knowledge: Good  Language: Good  Akathisia:  No  Handed:  Right  AIMS (if indicated): not done  Assets:  Communication Skills Desire for Improvement  ADL's:  Intact  Cognition: WNL  Sleep:  Fair   Screenings: AIMS    Flowsheet Row Office Visit from 05/12/2017 in [facility] Office Visit from 07/29/2016 in [facility] Office Visit from 05/06/2016 in [facility] Office Visit from 04/01/2016 in [facility] Office Visit from 03/28/2015 in [facility]  AIMS Total Score 0 0 0 0 0      GAD-7    Flowsheet Row Office Visit from 05/21/2021 in [facility] Office Visit from 01/07/2021 in [facility] Office Visit from 04/28/2018 in [facility]  Total GAD-7 Score 14 7 8       Mini-Mental    Flowsheet Row Office Visit from 09/07/2019 in [facility]  Total Score (max 30 points ) 16      PHQ2-9    Flowsheet Row Office Visit from 05/21/2021 in [facility] Office Visit from 04/07/2021 in [facility] Office Visit from 01/07/2021 in [facility] Video Visit from 12/26/2020 in [facility] Clinical Support from 09/03/2020 in [facility]  PHQ-2 Total Score 4 4 4 1 2   PHQ-9 Total Score 13 15 9  -- 15      Flowsheet Row Office Visit from 04/07/2021 in [facility] Video Visit from 12/26/2020 in [facility] Video Visit from 12/05/2020 in [facility]  C-SSRS RISK CATEGORY Low Risk No Risk No Risk        Assessment and Plan:  Tabitha [name] is a 51 y.o. year old female with a history of schizoaffective disorder,  sleep apnea (using CPAP machine), diabetes, hypertension, hyperlipidemia, GERD, who presents for follow up appointment for below.    1. Schizoaffective disorder, depressive type (HCC) She reports slight worsening in paranoia and depressive symptoms since her last visit. Psychosocial stressors includes upcoming court in relate to her husband.  We uptitrate olanzapine to target schizoaffective disorder.  Discussed potential metabolic side effect and EPS.  Will continue Invega IM injection.  Noted that she did have psychotic symptoms while she was on the injection only, and could not tolerate additional oral Invega.  Will continue Viibryd to target depression and anxiety.  Will continue gabapentin for anxiety.    2. Obsessive-compulsive disorder, unspecified type Although she reports OCD traits, it is not consuming as much compared to before.  Will continue Viibryd at the current dose to target this.  This clinician has discussed the side effect associated with medication prescribed during this encounter. Please refer to notes in the previous encounters for more details.      Plan Continue monthly invega 234 mg IM - monitor weight gain. She is on metformin.  Increase olanzapine 5 mg at night - monitor weight gain Continue Viibryd 40 mg daily Continue gabapentin 600 mg 3 times a day Next appointment- 3/9 at 11:30 for 30 mins, in person - [facility]   Past trials of medication: Abilify (hives), olanzapine, quetiapine, risperidone (stopped working),    I have reviewed suicide assessment in detail. No change in the following assessment.    The patient demonstrates the following risk factors for suicide: Chronic risk factors for suicide include: psychiatric disorder of schizoaffective disorder. Acute risk factors for suicide include: unemployment. Protective factors for this patient include: positive social support, responsibility to others (children, family), hope for the future and religious beliefs against suicide. Considering these factors, the overall suicide risk at this point appears to be low. Patient is appropriate for outpatient follow up.  Although she has guns at home, she does not have access to them.   Collaboration of Care: Other N/A  Consent: Patient/Guardian gives verbal consent for treatment and assignment of benefits for services provided during this telehealth visit. Patient/Guardian expressed understanding and agreed to proceed.      Tabitha Hottereina Diedre Maclellan, MD 06/10/2021, 10:30 AM

## 2021-06-08 ENCOUNTER — Other Ambulatory Visit: Payer: Self-pay | Admitting: Psychiatry

## 2021-06-09 IMAGING — CR DG KNEE COMPLETE 4+V*L*
1 series · 4 of 4 positions shown · non-contrast
Comparison: None.

CLINICAL DATA: Knee pain for 2 weeks

EXAM:
LEFT KNEE - COMPLETE 4+ VIEW

[Series 1: dg knee complete 4 views left · 0.14mm/px · 4 of 4 slices shown]
[im 1/4]
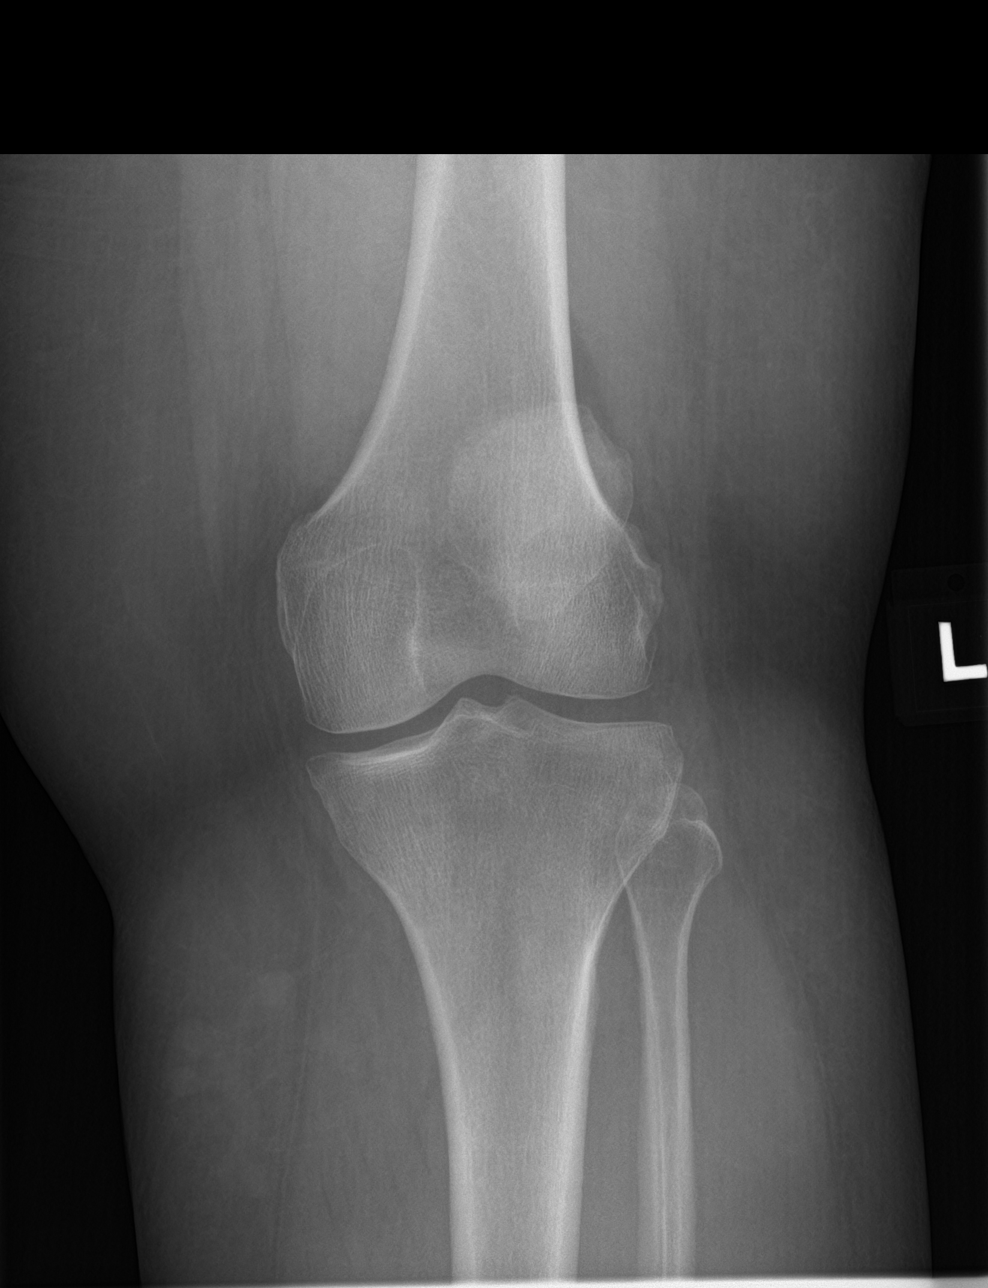
[im 2/4]
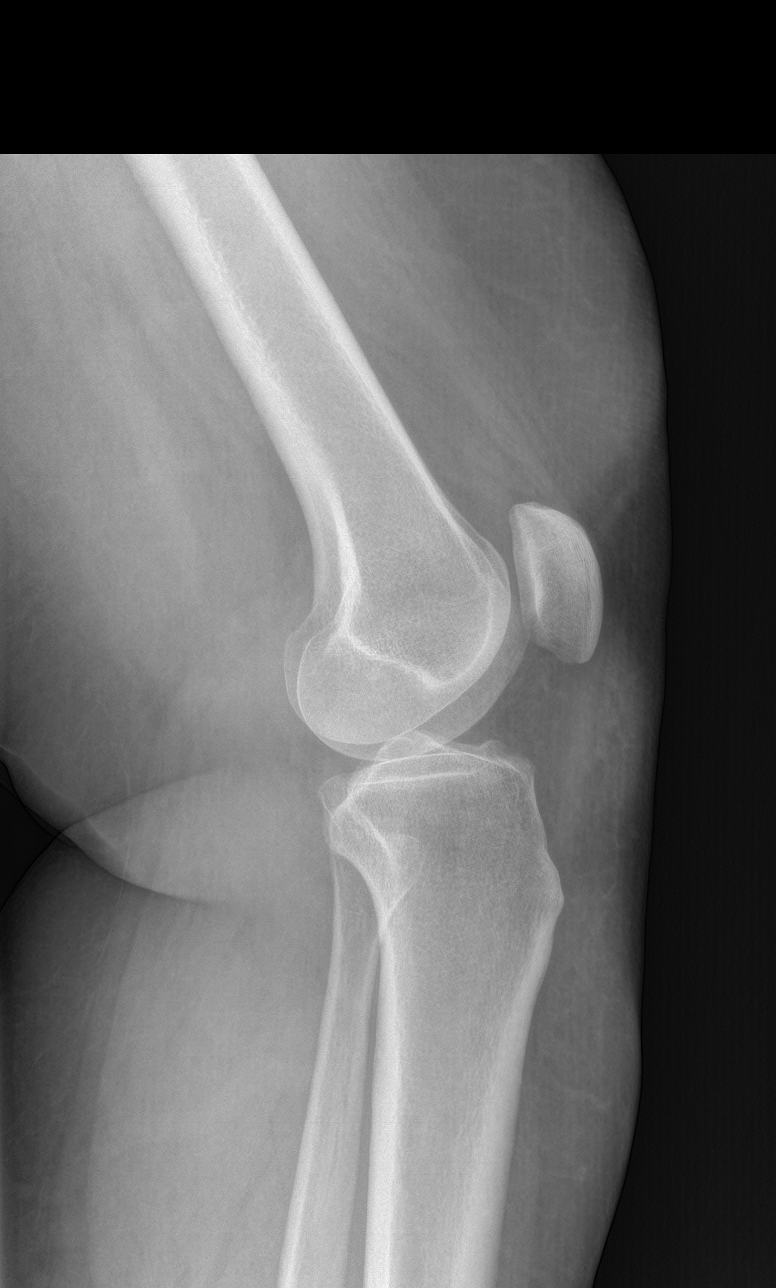
[im 3/4]
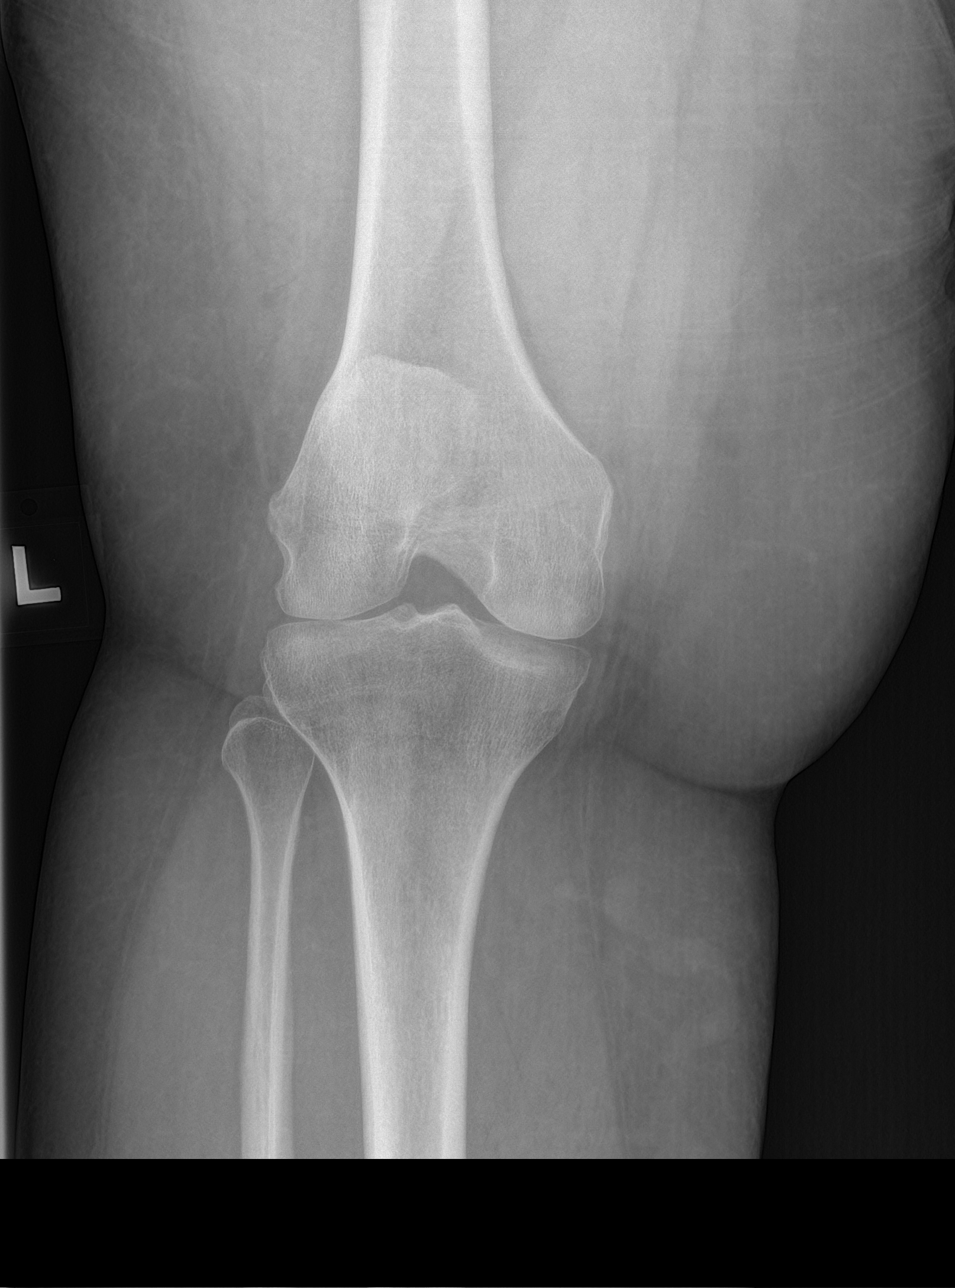
[im 4/4]
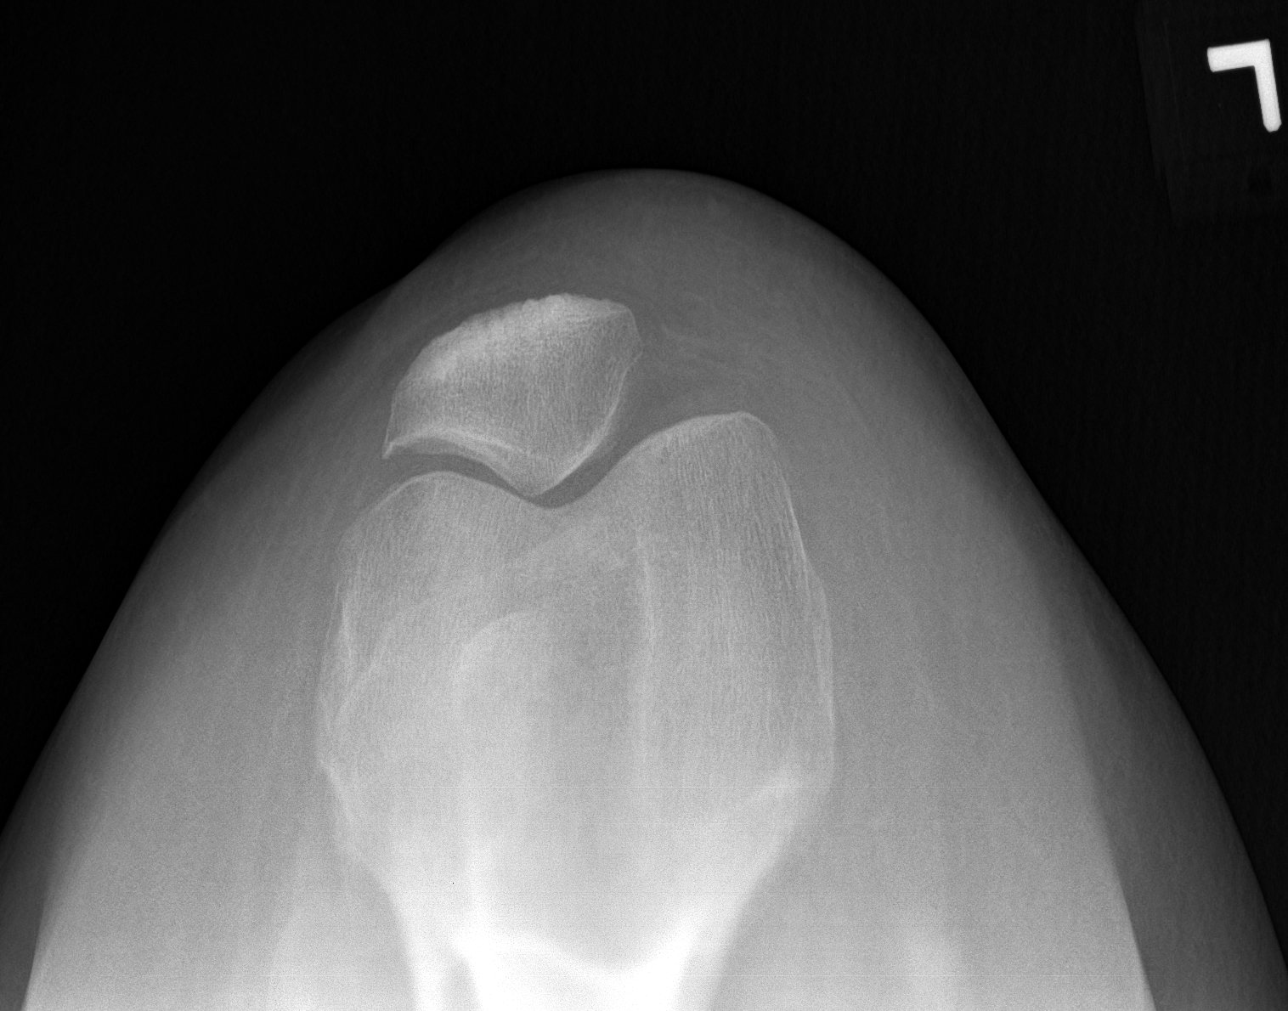

[4 of 4 positions shown; findings below may reference images not displayed]

FINDINGS: No fracture or malalignment. Minimal patellofemoral degenerative
change. Possible trace knee effusion
IMPRESSION: No acute osseous abnormality. Minimal degenerative change with
possible trace knee effusion

## 2021-06-10 ENCOUNTER — Other Ambulatory Visit: Payer: Self-pay

## 2021-06-10 ENCOUNTER — Encounter: Payer: Self-pay | Admitting: Psychiatry

## 2021-06-10 ENCOUNTER — Telehealth (INDEPENDENT_AMBULATORY_CARE_PROVIDER_SITE_OTHER): Payer: Medicare Other | Admitting: Psychiatry

## 2021-06-10 DIAGNOSIS — F251 Schizoaffective disorder, depressive type: Secondary | ICD-10-CM | POA: Diagnosis not present

## 2021-06-10 DIAGNOSIS — F429 Obsessive-compulsive disorder, unspecified: Secondary | ICD-10-CM | POA: Diagnosis not present

## 2021-06-10 NOTE — Patient Instructions (Signed)
Continue monthly invega 234 mg IM  Increase olanzapine 5 mg at night Continue Viibryd 40 mg daily Continue gabapentin 600 mg 3 times a day Next appointment- 3/9 at 11:30

## 2021-06-15 ENCOUNTER — Other Ambulatory Visit: Payer: Self-pay | Admitting: Internal Medicine

## 2021-06-16 NOTE — Telephone Encounter (Signed)
Requested Prescriptions  Pending Prescriptions Disp Refills   ACCU-CHEK GUIDE test strip [Pharmacy Med Name: ACCU-CHEK GUIDE TEST STRIPS 100S] 100 strip 2    Sig: TEST TWICE DAILY     Endocrinology: Diabetes - Testing Supplies Passed - 06/15/2021 12:13 PM      Passed - Valid encounter within last 12 months    Recent Outpatient Visits          3 weeks ago Encounter for general adult medical examination with abnormal findings   [facility] [place], Salvadore Oxford, NP   5 months ago Influenza vaccine needed   [facility] [place], Salvadore Oxford, NP   6 months ago COVID-19   [facility] [place], Salvadore Oxford, NP   1 year ago Encounter to establish care with new doctor   [facility], Jodelle Gross, FNP   1 year ago Acute lower UTI   [facility], Salley Hews, [place]      Future Appointments            In 2 months [facility], [facility]

## 2021-06-22 ENCOUNTER — Other Ambulatory Visit: Payer: Self-pay | Admitting: Internal Medicine

## 2021-06-22 DIAGNOSIS — K219 Gastro-esophageal reflux disease without esophagitis: Secondary | ICD-10-CM

## 2021-06-22 DIAGNOSIS — E119 Type 2 diabetes mellitus without complications: Secondary | ICD-10-CM

## 2021-06-23 NOTE — Telephone Encounter (Signed)
Requested Prescriptions  Pending Prescriptions Disp Refills   omeprazole (PRILOSEC) 10 MG capsule [Pharmacy Med Name: OMEPRAZOLE 10MG  CAPSULES] 90 capsule 1    Sig: TAKE 1 CAPSULE BY MOUTH DAILY     Gastroenterology: Proton Pump Inhibitors Passed - 06/22/2021  7:32 AM      Passed - Valid encounter within last 12 months    Recent Outpatient Visits          1 month ago Encounter for general adult medical examination with abnormal findings   [facility], Coralie Keens, NP   5 months ago Influenza vaccine needed   [facility] [place], Coralie Keens, NP   6 months ago [facility] [place], Coralie Keens, NP   1 year ago Encounter to establish care with new doctor   [facility], Lupita Raider, FNP   1 year ago Acute lower UTI   [facility], Lilia Argue, [place]      Future Appointments            In 2 months [facility], [facility]             pioglitazone (ACTOS) 45 MG tablet [Pharmacy Med Name: PIOGLITAZONE 45MG  TABLETS] 90 tablet 1    Sig: TAKE 1 TABLET(45 MG) BY MOUTH DAILY     Endocrinology:  Diabetes - Glitazones - pioglitazone Passed - 06/22/2021  7:32 AM      Passed - HBA1C is between 0 and 7.9 and within 180 days    HB A1C (BAYER DCA - WAIVED)  Date Value Ref Range Status  10/22/2017 6.9 <7.0 % Final    Comment:                                          Diabetic Adult            <7.0                                       Healthy Adult        4.3 - 5.7                                                           (DCCT/NGSP) American Diabetes Association's Summary of Glycemic Recommendations for Adults with Diabetes: Hemoglobin A1c <7.0%. More stringent glycemic goals (A1c <6.0%) may further reduce complications at the cost of increased risk of hypoglycemia.    Hgb A1c MFr Bld  Date Value Ref Range Status  05/21/2021 7.6 (H) <5.7 % of total Hgb Final    Comment:    For someone  without known diabetes, a hemoglobin A1c value of 6.5% or greater indicates that they may have  diabetes and this should be confirmed with a follow-up  test. . For someone with known diabetes, a value <7% indicates  that their diabetes is well controlled and a value  greater than or equal to 7% indicates suboptimal  control. A1c targets should be individualized based on  duration of diabetes, age, comorbid conditions, and  other considerations. . Currently,  no consensus exists regarding use of hemoglobin A1c for diagnosis of diabetes for children. Renella Cunas - Valid encounter within last 6 months    Recent Outpatient Visits          1 month ago Encounter for general adult medical examination with abnormal findings   [facility] [place], Coralie Keens, NP   5 months ago Influenza vaccine needed   [facility] [place], Coralie Keens, NP   6 months ago [facility] [place], Coralie Keens, NP   1 year ago Encounter to establish care with new doctor   [facility], Lupita Raider, FNP   1 year ago Acute lower UTI   [facility], Lilia Argue, [place]      Future Appointments            In 2 months [facility], [place]             ezetimibe (ZETIA) 10 MG tablet [Pharmacy Med Name: EZETIMIBE $RemoveBeforeD'10MG'[id number]$  TABLETS] 90 tablet 1    Sig: TAKE 1 TABLET(10 MG) BY MOUTH DAILY     Cardiovascular:  Antilipid - Sterol Transport Inhibitors Failed - 06/22/2021  7:32 AM      Failed - Lipid Panel in normal range within the last 12 months    Cholesterol, Total  Date Value Ref Range Status  09/07/2019 138 100 - 199 mg/dL Final   Cholesterol  Date Value Ref Range Status  05/21/2021 140 <200 mg/dL Final   Cholesterol [place], [place]  Date Value Ref Range Status  04/23/2017 176 <200 mg/dL Final    Comment:                            Desirable                <200                         Borderline High       200- 239                         High                     >239    LDL Cholesterol (Calc)  Date Value Ref Range Status  05/21/2021 59 mg/dL (calc) Final    Comment:    Reference range: <100 . Desirable range <100 mg/dL for primary prevention;   <70 mg/dL for patients with CHD or diabetic patients  with > or = 2 CHD risk factors. Marland Kitchen LDL-C is now calculated using the Martin-Hopkins  calculation, which is a validated novel method providing  better accuracy than the Friedewald equation in the  estimation of LDL-C.  Cresenciano Genre et al. Annamaria Helling. 4174;081(44): 2061-2068  (http://education.[employer].com/faq/FAQ164)    HDL  Date Value Ref Range Status  05/21/2021 52 > OR = 50 mg/dL Final  09/07/2019 55 >39 mg/dL Final   Triglycerides  Date Value Ref Range Status  05/21/2021 236 (H) <150 mg/dL Final    Comment:    . If a non-fasting specimen was collected, consider repeat triglyceride testing on a fasting specimen if clinically indicated.  Yates Decamp et al. J. of Clin. Lipidol. 8185;6:314-970. .    Triglycerides [place]ived  Date Value Ref Range  Status  04/23/2017 253 (H) <150 mg/dL Final    Comment:                            Normal                   <150                         Borderline High     150 - 199                         High                200 - 499                         Very High                >499          Passed - AST in normal range and within 360 days    AST  Date Value Ref Range Status  05/21/2021 14 10 - 35 U/L Final   AST (SGOT) [place], Waived  Date Value Ref Range Status  04/23/2017 24 11 - 38 U/L Final         Passed - ALT in normal range and within 360 days    ALT  Date Value Ref Range Status  05/21/2021 18 6 - 29 U/L Final   ALT (SGPT) [place], Waived  Date Value Ref Range Status  04/23/2017 35 10 - 47 U/L Final         Passed - Patient is not pregnant      Passed - Valid encounter within last 12 months    Recent Outpatient  Visits          1 month ago Encounter for general adult medical examination with abnormal findings   [facility] [place], Coralie Keens, NP   5 months ago Influenza vaccine needed   [facility] [place], Coralie Keens, NP   6 months ago [facility] [place], Coralie Keens, NP   1 year ago Encounter to establish care with new doctor   [facility], Lupita Raider, FNP   1 year ago Acute lower UTI   [facility], Lilia Argue, [place]      Future Appointments            In 2 months [facility], [facility]             metFORMIN (GLUCOPHAGE) 500 MG tablet [Pharmacy Med Name: METFORMIN $RemoveBeforeD'500MG'JcnYsJxxiimVTT$  TABLETS] 360 tablet 1    Sig: TAKE 2 TABLETS(1000 MG) BY MOUTH TWICE DAILY WITH A MEAL     Endocrinology:  Diabetes - Biguanides Failed - 06/22/2021  7:32 AM      Failed - B12 Level in normal range and within 720 days    Vitamin B-12  Date Value Ref Range Status  06/01/2017 578 232 - 1,245 pg/mL Final         Failed - CBC within normal limits and completed in the last 12 months    WBC  Date Value Ref Range Status  05/21/2021 6.0 3.8 - 10.8 Thousand/uL Final   RBC  Date Value Ref Range Status  05/21/2021 4.02 3.80 - 5.10 Million/uL  Final   Hemoglobin  Date Value Ref Range Status  05/21/2021 12.0 11.7 - 15.5 g/dL Final  04/28/2018 13.3 11.1 - 15.9 g/dL Final   HCT  Date Value Ref Range Status  05/21/2021 35.9 35.0 - 45.0 % Final   Hematocrit  Date Value Ref Range Status  04/28/2018 39.6 34.0 - 46.6 % Final   MCHC  Date Value Ref Range Status  05/21/2021 33.4 32.0 - 36.0 g/dL Final   [facility]  Date Value Ref Range Status  05/21/2021 29.9 27.0 - 33.0 pg Final   MCV  Date Value Ref Range Status  05/21/2021 89.3 80.0 - 100.0 fL Final  04/28/2018 86 79 - 97 fL Final   No results found for: PLTCOUNTKUC, LABPLAT, POCPLA RDW  Date Value Ref Range Status  05/21/2021 13.8 11.0 - 15.0 % Final  04/28/2018  15.1 12.3 - 15.4 % Final    Comment:    **Effective May 02, 2018, the RDW pediatric reference**   interval will be removed and the adult reference interval   will be changing to:                             Female 11.7 - 15.4                                                      Female 11.6 - 15.4          Passed - Cr in normal range and within 360 days    Creat  Date Value Ref Range Status  05/21/2021 0.67 0.50 - 0.99 mg/dL Final   Creatinine, Urine  Date Value Ref Range Status  01/08/2021 23 20 - 275 mg/dL Final         Passed - HBA1C is between 0 and 7.9 and within 180 days    HB A1C (BAYER DCA - WAIVED)  Date Value Ref Range Status  10/22/2017 6.9 <7.0 % Final    Comment:                                          Diabetic Adult            <7.0                                       Healthy Adult        4.3 - 5.7                                                           (DCCT/NGSP) American Diabetes Association's Summary of Glycemic Recommendations for Adults with Diabetes: Hemoglobin A1c <7.0%. More stringent glycemic goals (A1c <6.0%) may further reduce complications at the cost of increased risk of hypoglycemia.    Hgb A1c MFr Bld  Date Value Ref Range Status  05/21/2021 7.6 (H) <5.7 % of total Hgb Final    Comment:    For  someone without known diabetes, a hemoglobin A1c value of 6.5% or greater indicates that they may have  diabetes and this should be confirmed with a follow-up  test. . For someone with known diabetes, a value <7% indicates  that their diabetes is well controlled and a value  greater than or equal to 7% indicates suboptimal  control. A1c targets should be individualized based on  duration of diabetes, age, comorbid conditions, and  other considerations. . Currently, no consensus exists regarding use of hemoglobin A1c for diagnosis of diabetes for children. .          Passed - eGFR in normal range and within 360 days    GFR calc Af Amer   Date Value Ref Range Status  09/07/2019 88 >59 mL/min/1.73 Final    Comment:    **[facility] currently reports eGFR in compliance with the current**   recommendations of the [employer]. [facility] will   update reporting as new guidelines are published from the NKF-ASN   Task force.    GFR calc non Af Amer  Date Value Ref Range Status  09/07/2019 76 >59 mL/min/1.73 Final   eGFR  Date Value Ref Range Status  05/21/2021 107 > OR = 60 mL/min/1.35m2 Final    Comment:    The eGFR is based on the CKD-EPI 2021 equation. To calculate  the new eGFR from a previous Creatinine or Cystatin C result, go to https://www.kidney.org/professionals/ kdoqi/gfr%5Fcalculator          Passed - Valid encounter within last 6 months    Recent Outpatient Visits          1 month ago Encounter for general adult medical examination with abnormal findings   [place], NP   5 months ago Influenza vaccine needed   [facility] [place], Coralie Keens, NP   6 months ago [facility] [place], Coralie Keens, NP   1 year ago Encounter to establish care with new doctor   [place], FNP   1 year ago Acute lower UTI   [facility], Lilia Argue, [place]      Future Appointments            In 2 months [facility], [facility]

## 2021-07-01 ENCOUNTER — Ambulatory Visit: Payer: Medicare Other

## 2021-07-02 ENCOUNTER — Other Ambulatory Visit: Payer: Self-pay | Admitting: Psychiatry

## 2021-07-02 NOTE — Progress Notes (Signed)
BH MD/PA/NP OP Progress Note  07/03/2021 1:53 PM Tabitha [name]  MRN:  [id number]  Chief Complaint:  Chief Complaint  Patient presents with   Follow-up   HPI:  This is a follow-up appointment for schizoaffective disorder.  She states that she has not been doing well.  She will take injection after this visit as she needs to get it a few days ago.  She has been stressed due to upcoming court.  The case has gone up to [employer].  She does not know if she can trust the new attorney.  She also states that her son is getting endoscopy for GERD.  She has been feeling overwhelmed and frustrated.  She tends to have hypersomnia.  She takes a nap during the day.  She does not have good energy.  She had a period of feeling energized for a day, followed by crash.  She also reports increasing in appetite, which she attributes to feeling stressed.  She has been able to go to church, although she occasionally feels that people might be looking at her.  She has VH of seeing shadow.  She denies AH.  She denies SI.  She denies increased goal-directed activity.  She believes Depakote worked well in the past; she is willing to try this medication.  She denies alcohol use or drug use.    Wt Readings from Last 3 Encounters:  07/03/21 244 lb 12.8 oz (111 kg)  07/03/21 244 lb 12.8 oz (111 kg)  06/05/21 240 lb 9.6 oz (109.1 kg)     Visit Diagnosis:    ICD-10-CM   1. Obsessive-compulsive disorder, unspecified type  F42.9     2. Schizoaffective disorder, depressive type (HCC)  F25.1 gabapentin (NEURONTIN) 600 MG tablet    Valproic acid level    Hepatic function panel    CBC      Past Psychiatric History: Please see initial evaluation for full details. I have reviewed the history. No updates at this time.     Past Medical History:  Past Medical History:  Diagnosis Date   Allergic rhinitis    Anxiety    Asthma    Bipolar disorder (HCC)    Depression    Diabetes mellitus without complication (HCC)     GERD (gastroesophageal reflux disease)    Hyperlipidemia    Hypertension    Mild sleep apnea    OCD (obsessive compulsive disorder)    Palpitations    Paranoia (HCC)    Schizoaffective disorder (HCC)    Sleep apnea     Past Surgical History:  Procedure Laterality Date   CESAREAN SECTION  2006/2008   CHOLECYSTECTOMY  2006    Family Psychiatric History: Please see initial evaluation for full details. I have reviewed the history. No updates at this time.     Family History:  Family History  Problem Relation Age of Onset   Lupus Mother    Diabetes Mother        pre diabetes   Diabetes Father    Hyperlipidemia Father    Diabetes Brother    Depression Paternal Grandmother    Bipolar disorder Paternal Grandmother     Social History:  Social History   Socioeconomic History   Marital status: Married    Spouse name: Farryn Linares   Number of children: 2   Years of education: 16   Highest education level: Bachelor's degree (e.g., BA, AB, BS)  Occupational History   Occupation: disability  Tobacco Use  Smoking status: Never   Smokeless tobacco: Never  Vaping Use   Vaping Use: Never used  Substance and Sexual Activity   Alcohol use: Not Currently   Drug use: Never   Sexual activity: Yes    Partners: Male    Birth control/protection: Surgical  Other Topics Concern   Not on file  Social History Narrative   Not on file   Social Determinants of Health   Financial Resource Strain: Low Risk    Difficulty of Paying Living Expenses: Not hard at all  Food Insecurity: No Food Insecurity   Worried About Programme researcher, broadcasting/film/video in the Last Year: Never true   Ran Out of Food in the Last Year: Never true  Transportation Needs: No Transportation Needs   Lack of Transportation (Medical): No   Lack of Transportation (Non-Medical): No  Physical Activity: Inactive   Days of Exercise per Week: 0 days   Minutes of Exercise per Session: 0 min  Stress: Stress Concern Present    Feeling of Stress : To some extent  Social Connections: Not on file    Allergies:  Allergies  Allergen Reactions   Abilify [Aripiprazole] Hives    Metabolic Disorder Labs: Lab Results  Component Value Date   HGBA1C 7.6 (H) 05/21/2021   MPG 171 05/21/2021   MPG 166 01/08/2021   Lab Results  Component Value Date   PROLACTIN 84.8 (H) 06/01/2017   PROLACTIN 36.2 (H) 03/26/2016   Lab Results  Component Value Date   CHOL 140 05/21/2021   TRIG 236 (H) 05/21/2021   HDL 52 05/21/2021   CHOLHDL 2.7 05/21/2021   VLDL 51 (H) 04/23/2017   LDLCALC 59 05/21/2021   LDLCALC  01/08/2021     Comment:     . LDL cholesterol not calculated. Triglyceride levels greater than 400 mg/dL invalidate calculated LDL results. . Reference range: <100 . Desirable range <100 mg/dL for primary prevention;   <70 mg/dL for patients with CHD or diabetic patients  with > or = 2 CHD risk factors. Marland Kitchen LDL-C is now calculated using the Martin-Hopkins  calculation, which is a validated novel method providing  better accuracy than the Friedewald equation in the  estimation of LDL-C.  Horald Pollen et al. Lenox Ahr. 1610;960(45): 2061-2068  (http://education.[facility].com/faq/FAQ164)    Lab Results  Component Value Date   TSH 1.290 04/28/2018   TSH 2.570 06/01/2017    Therapeutic Level Labs: No results found for: LITHIUM Lab Results  Component Value Date   VALPROATE 52 10/22/2017   VALPROATE 43 (L) 06/01/2017   No components found for:  CBMZ  Current Medications: Current Outpatient Medications  Medication Sig Dispense Refill   ACCU-CHEK GUIDE test strip TEST TWICE DAILY 100 strip 2   albuterol (VENTOLIN HFA) 108 (90 Base) MCG/ACT inhaler INHALE 2 PUFFS INTO THE LUNGS EVERY 6 HOURS AS NEEDED FOR WHEEZING OR SHORTNESS OF BREATH 25.5 g 3   atorvastatin (LIPITOR) 20 MG tablet TAKE 1 TABLET(20 MG) BY MOUTH AT BEDTIME 90 tablet 0   divalproex (DEPAKOTE ER) 500 MG 24 hr tablet Take 1 tablet (500 mg  total) by mouth daily. 30 tablet 0   doxycycline (VIBRAMYCIN) 50 MG capsule TAKE 1 CAPSULE(50 MG) BY MOUTH EVERY MORNING 90 capsule 0   ezetimibe (ZETIA) 10 MG tablet TAKE 1 TABLET(10 MG) BY MOUTH DAILY 90 tablet 1   fluticasone (FLOVENT HFA) 110 MCG/ACT inhaler Inhale 1 puff into the lungs 2 (two) times daily. 1 Inhaler 11   hydrochlorothiazide (HYDRODIURIL) 12.5  MG tablet TAKE 1 TABLET(12.5 MG) BY MOUTH DAILY 90 tablet 1   ibuprofen (ADVIL) 200 MG tablet Take 200 mg by mouth every 6 (six) hours as needed.     insulin degludec (TRESIBA FLEXTOUCH) 100 UNIT/ML FlexTouch Pen INJECT 40 UNITS UNDER THE SKIN EVERY DAY 15 mL 5   Insulin Pen Needle (BD PEN NEEDLE NANO U/F) 32G X 4 MM MISC 1 each by Does not apply route daily. 100 each 12   MELATONIN PO Take 5 mg by mouth at bedtime as needed.     metFORMIN (GLUCOPHAGE) 500 MG tablet TAKE 2 TABLETS(1000 MG) BY MOUTH TWICE DAILY WITH A MEAL 360 tablet 1   NOVOTWIST 32G X 5 MM MISC USE WITH LEVEMIR PEN BID  5   OLANZapine (ZYPREXA) 2.5 MG tablet Take 1 tablet (2.5 mg total) by mouth at bedtime. 90 tablet 0   omeprazole (PRILOSEC) 10 MG capsule TAKE 1 CAPSULE BY MOUTH DAILY 90 capsule 1   paliperidone (INVEGA SUSTENNA) 234 MG/1.5ML SUSY injection Inject 234 mg into the muscle every 28 (twenty-eight) days. 1.5 mL 11   phenazopyridine (PYRIDIUM) 200 MG tablet Take 1 tablet (200 mg total) by mouth 3 (three) times daily as needed for pain. 10 tablet 0   pioglitazone (ACTOS) 45 MG tablet TAKE 1 TABLET(45 MG) BY MOUTH DAILY 90 tablet 1   Semaglutide,0.25 or 0.5MG /DOS, (OZEMPIC, 0.25 OR 0.5 MG/DOSE,) 2 MG/1.5ML SOPN Inject 1 mg into the skin once a week. (Patient taking differently: Inject 1.5 mg into the skin once a week.) 2 mL 2   SUPER B COMPLEX/C PO Take 1 tablet by mouth daily.     Vilazodone HCl (VIIBRYD) 40 MG TABS Take 1 tablet (40 mg total) by mouth daily. 90 tablet 1   [START ON 07/27/2021] gabapentin (NEURONTIN) 600 MG tablet Take 1 tablet (600 mg total)  by mouth 3 (three) times daily. 270 tablet 0   Current Facility-Administered Medications  Medication Dose Route Frequency Provider Last Rate Last Admin   paliperidone (INVEGA SUSTENNA) injection 234 mg  234 mg Intramuscular Q28 days [name], MD         Musculoskeletal: Strength & Muscle Tone:  normal Gait & Station: normal Patient leans: N/A  Psychiatric Specialty Exam: Review of Systems  Psychiatric/Behavioral:  Positive for decreased concentration, dysphoric mood, hallucinations and sleep disturbance. Negative for agitation, behavioral problems, confusion, self-injury and suicidal ideas. The patient is nervous/anxious. The patient is not hyperactive.   All other systems reviewed and are negative.  Blood pressure (!) 158/92, pulse (!) 109, temperature 98.1 F (36.7 C), temperature source Temporal, weight 244 lb 12.8 oz (111 kg).Body mass index is 44.77 kg/m.  General Appearance: Fairly Groomed  Eye Contact:  Good  Speech:  Clear and Coherent  Volume:  Normal  Mood:  Depressed  Affect:  Appropriate, Congruent, and Tearful  Thought Process:  Coherent  Orientation:  Full (Time, Place, and Person)  Thought Content: Logical   Suicidal Thoughts:  No  Homicidal Thoughts:  No  Memory:  Immediate;   Good  Judgement:  Good  Insight:  Good  Psychomotor Activity:  Normal, no tremors  Concentration:  Concentration: Good and Attention Span: Good  Recall:  Good  Fund of Knowledge: Good  Language: Good  Akathisia:  No  Handed:  Right  AIMS (if indicated): not done  Assets:  Communication Skills Desire for Improvement  ADL's:  Intact  Cognition: WNL  Sleep:   hypersomnia   Screenings: AIMS  Flowsheet Row Office Visit from 05/12/2017 in [facility] Office Visit from 07/29/2016 in [facility] Office Visit from 05/06/2016 in [facility] Office Visit from 04/01/2016 in [facility] Office Visit from 03/28/2015 in [facility]  AIMS Total Score 0 0 0 0 0      GAD-7    Flowsheet Row Office Visit from 05/21/2021 in [facility] Office Visit from 01/07/2021 in [facility] Office Visit from 04/28/2018 in [facility]  Total GAD-7 Score Mini-Mental    Flowsheet Row Office Visit from 09/07/2019 in [facility]  Total Score (max 30 points ) 16      PHQ2-9    Flowsheet Row Office Visit from 07/03/2021 in [facility] Office Visit from 05/21/2021 in [facility] Office Visit from 04/07/2021 in [facility] Office Visit from 01/07/2021 in [facility] Video Visit from 12/26/2020 in [facility]  PHQ-2 Total Score PHQ-9 Total Score --      Flowsheet Row Office Visit from 07/03/2021 in [facility] Office Visit from 04/07/2021 in [facility] Video Visit from 12/26/2020 in [facility]  C-SSRS RISK CATEGORY No Risk Low Risk No Risk        Assessment and Plan:  Tabitha [name] is a 51 y.o. year old female with a history of schizoaffective disorder,  sleep apnea (using CPAP machine), diabetes, hypertension, hyperlipidemia, GERD, who presents for follow up appointment for below.   1. Schizoaffective disorder, depressive type (HCC) She reports significant worsening in depressive symptoms with subthreshold hypomanic symptoms in the context of upcoming [employer] in May to her husband, and her son undergoing evaluation of GERD.  Will add Depakote to target her mood dysregulation.  Discussed potential risk of drowsiness, weight gain, LFT abnormality.  Will continue Invega IM injection and additional olanzapine given she has treatment resistant  hallucinations with Invega injection.  Noted that she has had weight gain since the last visit, which she attributes to stress eating.  Will continue to monitor this.  Will continue Viibryd to target depression and anxiety.  Will continue gabapentin for anxiety.   2. Obsessive-compulsive disorder, unspecified type Although she reports OCD traits, it is not interfering with her daily activity.  Will continue Viibryd at the current dose to target this.   This clinician has discussed the side effect associated with medication prescribed during this encounter. Please refer to notes in the previous encounters for more details.      Plan Continue monthly invega 234 mg IM - monitor weight gain. She is on metformin.  Continue olanzapine 5 mg at night - monitor weight gain Start Depakote 500 mg daily Obtain labs five days after starting Depakote- (VPA, CBC, LFT) Continue Viibryd 40 mg daily Continue gabapentin 600 mg 3 times a day Next appointment- 4/13 at 11:30 for 30 mins, in person - She will see her PCP at [facility]   Past trials of medication: Abilify (hives), olanzapine, quetiapine, risperidone (stopped working),    I have reviewed suicide assessment in detail. No change in the following assessment.    The patient demonstrates the following risk factors for suicide: Chronic risk factors for suicide include: psychiatric disorder of schizoaffective disorder. Acute risk factors for suicide  include: unemployment. Protective factors for this patient include: positive social support, responsibility to others (children, family), hope for the future and religious beliefs against suicide. Considering these factors, the overall suicide risk at this point appears to be low. Patient is appropriate for outpatient follow up.  Although she has guns at home, she does not have access to them.    Collaboration of Care: Other N/A   Consent: Patient/Guardian gives verbal consent for treatment and  assignment of benefits for services provided during this telehealth visit. Patient/Guardian expressed understanding and agreed to proceed.       Collaboration of Care: Collaboration of Care: Other N/A  Consent: Patient/Guardian gives verbal consent for treatment and assignment of benefits for services provided during this visit. Patient/Guardian expressed understanding and agreed to proceed.    Neysa Hottereina Alaisha Eversley, MD 07/03/2021, 1:53 PM

## 2021-07-03 ENCOUNTER — Ambulatory Visit (INDEPENDENT_AMBULATORY_CARE_PROVIDER_SITE_OTHER): Payer: Medicare Other

## 2021-07-03 ENCOUNTER — Telehealth: Payer: Self-pay

## 2021-07-03 ENCOUNTER — Other Ambulatory Visit: Payer: Self-pay

## 2021-07-03 ENCOUNTER — Encounter: Payer: Self-pay | Admitting: Psychiatry

## 2021-07-03 ENCOUNTER — Other Ambulatory Visit: Payer: Self-pay | Admitting: Psychiatry

## 2021-07-03 ENCOUNTER — Ambulatory Visit (INDEPENDENT_AMBULATORY_CARE_PROVIDER_SITE_OTHER): Payer: Medicare Other | Admitting: Psychiatry

## 2021-07-03 VITALS — BP 158/92 | HR 109 | Temp 98.1°F | Wt 244.8 lb

## 2021-07-03 DIAGNOSIS — F3170 Bipolar disorder, currently in remission, most recent episode unspecified: Secondary | ICD-10-CM

## 2021-07-03 DIAGNOSIS — F422 Mixed obsessional thoughts and acts: Secondary | ICD-10-CM | POA: Diagnosis not present

## 2021-07-03 DIAGNOSIS — F251 Schizoaffective disorder, depressive type: Secondary | ICD-10-CM

## 2021-07-03 DIAGNOSIS — F429 Obsessive-compulsive disorder, unspecified: Secondary | ICD-10-CM | POA: Diagnosis not present

## 2021-07-03 MED ORDER — PALIPERIDONE PALMITATE ER 234 MG/1.5ML IM SUSY
234.0000 mg | PREFILLED_SYRINGE | INTRAMUSCULAR | Status: AC
Start: 2021-07-03 — End: 2021-11-28
  Administered 2021-07-03 – 2021-11-28 (×6): 234 mg via INTRAMUSCULAR

## 2021-07-03 MED ORDER — GABAPENTIN 600 MG PO TABS: 600.0000 mg | ORAL_TABLET | Freq: Three times a day (TID) | ORAL | 0 refills | Status: DC

## 2021-07-03 MED ORDER — DIVALPROEX SODIUM ER 500 MG PO TB24
500.0000 mg | ORAL_TABLET | Freq: Every day | ORAL | 0 refills | Status: DC
Start: 2021-07-03 — End: 2021-07-03

## 2021-07-03 MED ORDER — DIVALPROEX SODIUM ER 500 MG PO TB24: 500.0000 mg | ORAL_TABLET | Freq: Every day | ORAL | 0 refills | Status: DC

## 2021-07-03 NOTE — Patient Instructions (Signed)
pt was given invega sustenna 234mg /1.14ml in the upper left out quatient.   pt appears clean. Pt states is not doing well. She states that she been having mood swings   ?  ?Pt to return in approximately 28 days for next injection ?s/n # 4m exp 08-24  lot # [id number]   NDC# 03-10-1982.    ?  ?Patient to return in 28 day for next injection.  ?

## 2021-07-03 NOTE — Patient Instructions (Addendum)
Continue monthly invega 234 mg IM ?Continue olanzapine 5 mg at night  ?Start Depakote 500 mg daily ?Obtain labs five days after starting depakote- (VPA, CBC, LFT) ?Continue Viibryd 40 mg daily ?Continue gabapentin 600 mg 3 times a day ?Next appointment- 4/13 at 11:30, in person ?

## 2021-07-03 NOTE — Telephone Encounter (Signed)
received request that insurance needs a 90 day supply to cover. for the divalproex ?

## 2021-07-03 NOTE — Telephone Encounter (Signed)
Ordered

## 2021-07-12 ENCOUNTER — Other Ambulatory Visit: Payer: Self-pay | Admitting: Family Medicine

## 2021-07-12 ENCOUNTER — Other Ambulatory Visit: Payer: Self-pay | Admitting: Internal Medicine

## 2021-07-12 DIAGNOSIS — E7800 Pure hypercholesterolemia, unspecified: Secondary | ICD-10-CM

## 2021-07-14 ENCOUNTER — Telehealth: Payer: Self-pay

## 2021-07-14 ENCOUNTER — Other Ambulatory Visit: Payer: Self-pay | Admitting: Psychiatry

## 2021-07-14 MED ORDER — OLANZAPINE 5 MG PO TABS: 5.0000 mg | ORAL_TABLET | Freq: Every day | ORAL | 0 refills | Status: DC

## 2021-07-14 NOTE — Telephone Encounter (Signed)
Requested Prescriptions  ?Pending Prescriptions Disp Refills  ?? OZEMPIC, 1 MG/DOSE, 4 MG/3ML SOPN [Pharmacy Med Name: OZEMPIC 1MG  PER DOSE (1X4MG  PEN)] 9 mL 0  ?  Sig: INJECT 1 MG INTO THE SKIN ONCE A WEEK  ?  ? Endocrinology:  Diabetes - GLP-1 Receptor Agonists - semaglutide Failed - 07/12/2021 11:13 AM  ?  ?  Failed - HBA1C in normal range and within 180 days  ?  HB A1C (BAYER DCA - WAIVED)  ?Date Value Ref Range Status  ?10/22/2017 6.9 <7.0 % Final  ?  Comment:  ?                                        Diabetic Adult            <7.0 ?                                      Healthy Adult        4.3 - 5.7 ?                                                          (DCCT/NGSP) ?American Diabetes Association's Summary of Glycemic Recommendations ?for Adults with Diabetes: Hemoglobin A1c <7.0%. More stringent ?glycemic goals (A1c <6.0%) may further reduce complications at the ?cost of increased risk of hypoglycemia. ?  ? ?Hgb A1c MFr Bld  ?Date Value Ref Range Status  ?05/21/2021 7.6 (H) <5.7 % of total Hgb Final  ?  Comment:  ?  For someone without known diabetes, a hemoglobin A1c ?value of 6.5% or greater indicates that they may have  ?diabetes and this should be confirmed with a follow-up  ?test. ?. ?For someone with known diabetes, a value <7% indicates  ?that their diabetes is well controlled and a value  ?greater than or equal to 7% indicates suboptimal  ?control. A1c targets should be individualized based on  ?duration of diabetes, age, comorbid conditions, and  ?other considerations. ?. ?Currently, no consensus exists regarding use of ?hemoglobin A1c for diagnosis of diabetes for children. ?. ?  ?   ?  ?  Passed - Cr in normal range and within 360 days  ?  Creat  ?Date Value Ref Range Status  ?05/21/2021 0.67 0.50 - 0.99 mg/dL Final  ? ?Creatinine, Urine  ?Date Value Ref Range Status  ?01/08/2021 23 20 - 275 mg/dL Final  ?   ?  ?  Passed - Valid encounter within last 6 months  ?  Recent Outpatient Visits   ?       ? 1 month ago Encounter for general adult medical examination with abnormal findings  ? [facility] [place], [place] W, NP  ? 6 months ago Influenza vaccine needed  ? [facility] [place], [place] W, NP  ? 7 months ago COVID-19  ? [facility] [place], Coralie Keens, NP  ? 1 year ago Encounter to establish care with new doctor  ? [place], FNP  ? 1 year ago Acute lower UTI  ? [facility] [place], Apolonio Schneiders  Benjamine Mola, PA-C  ?  ?  ?Future Appointments   ?        ? In 1 month [place]   ?  ? ?  ?  ?  ? ? ?

## 2021-07-14 NOTE — Telephone Encounter (Signed)
Ordered

## 2021-07-14 NOTE — Telephone Encounter (Signed)
Requested Prescriptions  ?Pending Prescriptions Disp Refills  ?? atorvastatin (LIPITOR) 20 MG tablet [Pharmacy Med Name: ATORVASTATIN 20MG  TABLETS] 90 tablet 0  ?  Sig: TAKE 1 TABLET(20 MG) BY MOUTH AT BEDTIME  ?  ? Cardiovascular:  Antilipid - Statins Failed - 07/12/2021 11:17 AM  ?  ?  Failed - Lipid Panel in normal range within the last 12 months  ?  Cholesterol, Total  ?Date Value Ref Range Status  ?09/07/2019 138 100 - 199 mg/dL Final  ? ?Cholesterol  ?Date Value Ref Range Status  ?05/21/2021 140 <200 mg/dL Final  ? ?Cholesterol [place]o, [place]  ?Date Value Ref Range Status  ?04/23/2017 176 <200 mg/dL Final  ?  Comment:  ?                          Desirable                <200 ?                        Borderline High      200- 239 ?                        High                     >239 ?  ? ?LDL Cholesterol (Calc)  ?Date Value Ref Range Status  ?05/21/2021 59 mg/dL (calc) Final  ?  Comment:  ?  Reference range: <100 ?05/23/2021 ?Desirable range <100 mg/dL for primary prevention;   ?<70 mg/dL for patients with CHD or diabetic patients  ?with > or = 2 CHD risk factors. ?. ?LDL-C is now calculated using the Martin-Hopkins  ?calculation, which is a validated novel method providing  ?better accuracy than the Friedewald equation in the  ?estimation of LDL-C.  ?Marland Kitchen et al. Horald Pollen. Lenox Ahr): 2061-2068  ?(http://education.[facility].com/faq/FAQ164) ?  ? ?HDL  ?Date Value Ref Range Status  ?05/21/2021 52 > OR = 50 mg/dL Final  ?05/23/2021 55 05/39/7673 mg/dL Final  ? ?Triglycerides  ?Date Value Ref Range Status  ?05/21/2021 236 (H) <150 mg/dL Final  ?  Comment:  ?  . ?If a non-fasting specimen was collected, consider ?repeat triglyceride testing on a fasting specimen ?if clinically indicated.  ?05/23/2021 al. J. of Clin. Lipidol. 2015;9:129-169. ?. ?  ? ?Triglycerides Piccolo,Waived  ?Date Value Ref Range Status  ?04/23/2017 253 (H) <150 mg/dL Final  ?  Comment:  ?                          Normal                   <150 ?                         Borderline High     150 - 199 ?                        High                200 - 499 ?                        Very High                >  499 ?  ? ?  ?  ?  Passed - Patient is not pregnant  ?  ?  Passed - Valid encounter within last 12 months  ?  Recent Outpatient Visits   ?      ? 1 month ago Encounter for general adult medical examination with abnormal findings  ? [facility] [place], [place] W, NP  ? 6 months ago Influenza vaccine needed  ? [facility] [place], [place] W, NP  ? 7 months ago COVID-19  ? [facility] [place], Salvadore [place], NP  ? 1 year ago Encounter to establish care with new doctor  ? [facility], Jodelle Gross, FNP  ? 1 year ago Acute lower UTI  ? [facility] [place], [place], [place]  ?  ?  ?Future Appointments   ?        ? In 1 month [facility], [place]   ?  ? ?  ?  ?  ? ? ?

## 2021-07-14 NOTE — Telephone Encounter (Signed)
received fax requesting a refill on the olanzapine  ?

## 2021-08-04 ENCOUNTER — Ambulatory Visit: Payer: Self-pay

## 2021-08-04 ENCOUNTER — Telehealth: Payer: Medicare Other | Admitting: Physician Assistant

## 2021-08-04 DIAGNOSIS — R599 Enlarged lymph nodes, unspecified: Secondary | ICD-10-CM

## 2021-08-04 DIAGNOSIS — T63441A Toxic effect of venom of bees, accidental (unintentional), initial encounter: Secondary | ICD-10-CM

## 2021-08-04 DIAGNOSIS — L255 Unspecified contact dermatitis due to plants, except food: Secondary | ICD-10-CM

## 2021-08-04 DIAGNOSIS — L03221 Cellulitis of neck: Secondary | ICD-10-CM

## 2021-08-04 MED ORDER — EPINEPHRINE 0.3 MG/0.3ML IJ SOAJ: 0.3000 mg | INTRAMUSCULAR | 0 refills | Status: AC | PRN

## 2021-08-04 MED ORDER — PREDNISONE 10 MG (21) PO TBPK: ORAL_TABLET | ORAL | 0 refills | Status: DC

## 2021-08-04 MED ORDER — CEPHALEXIN 500 MG PO CAPS: 500.0000 mg | ORAL_CAPSULE | Freq: Four times a day (QID) | ORAL | 0 refills | Status: DC

## 2021-08-04 NOTE — Telephone Encounter (Signed)
?  Chief Complaint: rash/rash swelling to under chin ?Symptoms: itching, small blisters on legs, red rash to  ?Frequency: Thursday ?Pertinent Negatives: Patient denies mouth or tongue swelling and SOB, dizziness joint pain, sore throat, ?Disposition: [] ED /[] Urgent Care (no appt availability in office) / [x] Appointment(In office/virtual)/ []  [facility]/ [] Home Care/ [] Refused Recommended Disposition /[] [place] [facility]/ []  Follow-up with PCP ?Additional Notes: No VV in office- pt prefers VV. Appt made for VV at 1030 this am.  ? ? ? ?Reason for Disposition ? Large or small blisters on skin (i.e., fluid filled bubbles or sacs) ? ?Answer Assessment - Initial Assessment Questions ?1. APPEARANCE of RASH: "Describe the rash." (e.g., spots, blisters, raised areas, skin peeling, scaly) ?    Blotches, raised lines on neck and legs, blisters on legs ?2. SIZE: "How big are the spots?" (e.g., tip of pen, eraser, coin; inches, centimeters) ?    Legs small bumps  neck - neck: 2 inches swelling on under chin and neck towards the right red lines on legs ?3. LOCATION: "Where is the rash located?" ?    Legs, neck,  ?4. COLOR: "What color is the rash?" (Note: It is difficult to assess rash color in people with darker-colored skin. When this situation occurs, simply ask the caller to describe what they see.) ?    red ?5. ONSET: "When did the rash begin?" ?    Thursday ?6. FEVER: "Do you have a fever?" If Yes, ask: "What is your temperature, how was it measured, and when did it start?" ?    No chills ?7. ITCHING: "Does the rash itch?" If Yes, ask: "How bad is the itch?" (Scale 1-10; or mild, moderate, severe) ?    Yes- moderate ?8. CAUSE: "What do you think is causing the rash?" ?    Bee sting ?9. MEDICINE FACTORS: "Have you started any new medicines within the last 2 weeks?" (e.g., antibiotics)  ?    olazipine ?10. OTHER SYMPTOMS: "Do you have any other symptoms?" (e.g., dizziness, headache, sore throat, joint  pain) ?      none ?11. PREGNANCY: "Is there any chance you are pregnant?" "When was your last menstrual period?" ?      *No Answer* ? ?Protocols used: Rash or Redness - Widespread-A-AH ? ?

## 2021-08-04 NOTE — Progress Notes (Signed)
?Virtual Visit Consent  ? ?Tabitha [name], you are scheduled for a virtual visit with a [facility] provider today.   ?  ?Just as with appointments in the office, your consent must be obtained to participate.  Your consent will be active for this visit and any virtual visit you may have with one of our providers in the next 365 days.   ?  ?If you have a MyChart account, a copy of this consent can be sent to you electronically.  All virtual visits are billed to your insurance company just like a traditional visit in the office.   ? ?As this is a virtual visit, video technology does not allow for your provider to perform a traditional examination.  This may limit your provider's ability to fully assess your condition.  If your provider identifies any concerns that need to be evaluated in person or the need to arrange testing (such as labs, EKG, etc.), we will make arrangements to do so.   ?  ?Although advances in technology are sophisticated, we cannot ensure that it will always work on either your end or our end.  If the connection with a video visit is poor, the visit may have to be switched to a telephone visit.  With either a video or telephone visit, we are not always able to ensure that we have a secure connection.    ? ?I need to obtain your verbal consent now.   Are you willing to proceed with your visit today?  ?  ?Tabitha [name] has provided verbal consent on 08/04/2021 for a virtual visit (video or telephone). ?  ?Tabitha Loveless, PA-C  ? ?Date: 08/04/2021 10:39 AM ? ? ?Virtual Visit via Video Note  ? ?Tabitha [name], connected with  Tabitha [name]  ([id number], 08-20-1970) on 08/04/21 at 10:30 AM EDT by a video-enabled telemedicine application and verified that I am speaking with the correct person using two identifiers. ? ?Location: ?Patient: Virtual Visit Location Patient: Home ?Provider: Virtual Visit Location Provider: Home Office ?  ?I discussed the limitations of evaluation and  management by telemedicine and the availability of in person appointments. The patient expressed understanding and agreed to proceed.   ? ?History of Present Illness: ?Tabitha [name] is a 51 y.o. who identifies as a female who was assigned female at birth, and is being seen today for rash. ? ?HPI: Rash ?This is a new problem. The current episode started in the past 7 days (Thursday). The problem has been gradually worsening since onset. The affected locations include the left upper leg, neck and right upper leg. The rash is characterized by redness, itchiness, blistering, swelling and dryness. She was exposed to an insect bite/sting and plant contact. Pertinent negatives include no congestion, cough, facial edema, fever, shortness of breath or sore throat. Past treatments include anti-itch cream and antihistamine. The treatment provided no relief.   ?Was stung on the neck by a bee and may also have poison oak on legs. ? ?Problems:  ?Patient Active Problem List  ? Diagnosis Date Noted  ? Obsessive-compulsive disorder 02/28/2019  ? Iron deficiency anemia 07/25/2017  ? Schizoaffective disorder, depressive type (HCC)   ? Frequent headaches 09/07/2016  ? 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 Asthma 06/15/2016  ? Acne rosacea 06/15/2016  ? Morbid obesity (HCC) 09/24/2015  ? DM (diabetes mellitus), type 2 (HCC) 12/03/2014  ? Hypertension associated with diabetes (HCC) 12/03/2014  ? Hyperlipidemia 12/03/2014  ? Bipolar disorder in partial  remission (HCC) 12/03/2014  ? GERD without esophagitis 12/03/2014  ? Sleep apnea 05/01/2010  ?  ?Allergies:  ?Allergies  ?Allergen Reactions  ? Abilify [Aripiprazole] Hives  ? ?Medications:  ?Current Outpatient Medications:  ?  cephALEXin (KEFLEX) 500 MG capsule, Take 1 capsule (500 mg total) by mouth 4 (four) times daily., Disp: 20 capsule, Rfl: 0 ?  EPINEPHrine 0.3 mg/0.3 mL IJ SOAJ injection, Inject 0.3 mg into the muscle as needed for anaphylaxis., Disp: 1 each, Rfl: 0 ?  predniSONE  (STERAPRED UNI-PAK 21 TAB) 10 MG (21) TBPK tablet, 6 day taper; take as directed on package instructions, Disp: 21 tablet, Rfl: 0 ?  ACCU-CHEK GUIDE test strip, TEST TWICE DAILY, Disp: 100 strip, Rfl: 2 ?  albuterol (VENTOLIN HFA) 108 (90 Base) MCG/ACT inhaler, INHALE 2 PUFFS INTO THE LUNGS EVERY 6 HOURS AS NEEDED FOR WHEEZING OR SHORTNESS OF BREATH, Disp: 25.5 g, Rfl: 3 ?  atorvastatin (LIPITOR) 20 MG tablet, TAKE 1 TABLET(20 MG) BY MOUTH AT BEDTIME, Disp: 90 tablet, Rfl: 0 ?  divalproex (DEPAKOTE ER) 500 MG 24 hr tablet, Take 1 tablet (500 mg total) by mouth daily., Disp: 90 tablet, Rfl: 0 ?  doxycycline (VIBRAMYCIN) 50 MG capsule, TAKE 1 CAPSULE(50 MG) BY MOUTH EVERY MORNING, Disp: 90 capsule, Rfl: 0 ?  ezetimibe (ZETIA) 10 MG tablet, TAKE 1 TABLET(10 MG) BY MOUTH DAILY, Disp: 90 tablet, Rfl: 1 ?  fluticasone (FLOVENT HFA) 110 MCG/ACT inhaler, Inhale 1 puff into the lungs 2 (two) times daily., Disp: 1 Inhaler, Rfl: 11 ?  gabapentin (NEURONTIN) 600 MG tablet, Take 1 tablet (600 mg total) by mouth 3 (three) times daily., Disp: 270 tablet, Rfl: 0 ?  hydrochlorothiazide (HYDRODIURIL) 12.5 MG tablet, TAKE 1 TABLET(12.5 MG) BY MOUTH DAILY, Disp: 90 tablet, Rfl: 1 ?  ibuprofen (ADVIL) 200 MG tablet, Take 200 mg by mouth every 6 (six) hours as needed., Disp: , Rfl:  ?  insulin degludec (TRESIBA FLEXTOUCH) 100 UNIT/ML FlexTouch Pen, INJECT 40 UNITS UNDER THE SKIN EVERY DAY, Disp: 15 mL, Rfl: 5 ?  Insulin Pen Needle (BD PEN NEEDLE NANO U/F) 32G X 4 MM MISC, 1 each by Does not apply route daily., Disp: 100 each, Rfl: 12 ?  MELATONIN PO, Take 5 mg by mouth at bedtime as needed., Disp: , Rfl:  ?  metFORMIN (GLUCOPHAGE) 500 MG tablet, TAKE 2 TABLETS(1000 MG) BY MOUTH TWICE DAILY WITH A MEAL, Disp: 360 tablet, Rfl: 1 ?  NOVOTWIST 32G X 5 MM MISC, USE WITH LEVEMIR PEN BID, Disp: , Rfl: 5 ?  OLANZapine (ZYPREXA) 5 MG tablet, Take 1 tablet (5 mg total) by mouth at bedtime., Disp: 90 tablet, Rfl: 0 ?  omeprazole (PRILOSEC) 10 MG  capsule, TAKE 1 CAPSULE BY MOUTH DAILY, Disp: 90 capsule, Rfl: 1 ?  OZEMPIC, 1 MG/DOSE, 4 MG/3ML SOPN, INJECT 1 MG INTO THE SKIN ONCE A WEEK, Disp: 9 mL, Rfl: 0 ?  paliperidone (INVEGA SUSTENNA) 234 MG/1.5ML SUSY injection, Inject 234 mg into the muscle every 28 (twenty-eight) days., Disp: 1.5 mL, Rfl: 11 ?  phenazopyridine (PYRIDIUM) 200 MG tablet, Take 1 tablet (200 mg total) by mouth 3 (three) times daily as needed for pain., Disp: 10 tablet, Rfl: 0 ?  pioglitazone (ACTOS) 45 MG tablet, TAKE 1 TABLET(45 MG) BY MOUTH DAILY, Disp: 90 tablet, Rfl: 1 ?  SUPER B COMPLEX/C PO, Take 1 tablet by mouth daily., Disp: , Rfl:  ?  Vilazodone HCl (VIIBRYD) 40 MG TABS, Take 1 tablet (40 mg total)  by mouth daily., Disp: 90 tablet, Rfl: 1 ? ?Current Facility-Administered Medications:  ?  paliperidone (INVEGA SUSTENNA) injection 234 mg, 234 mg, Intramuscular, Q28 days, Hisada, Reina, MD, 234 mg at 07/03/21 1353 ? ?Observations/Objective: ?Patient is well-developed, well-nourished in no acute distress.  ?Resting comfortably at home.  ?Head is normocephalic, atraumatic.  ?No labored breathing.  ?Speech is clear and coherent with logical content.  ?Patient is alert and oriented at baseline.  ?Large well-demarcated erythematous patch on left anterolateral neck with overlying fine papular lesions and yellow-crusted areas near bee sting ?Vesicular lesions in a line on both upper thighs with serous drainage ?Tenderness to self palpation over the right anterior cervical lymph chain ? ? ?Assessment and Plan: ?1. Cellulitis of neck ?- cephALEXin (KEFLEX) 500 MG capsule; Take 1 capsule (500 mg total) by mouth 4 (four) times daily.  Dispense: 20 capsule; Refill: 0 ? ?2. Bee sting reaction, accidental or unintentional, initial encounter ?- cephALEXin (KEFLEX) 500 MG capsule; Take 1 capsule (500 mg total) by mouth 4 (four) times daily.  Dispense: 20 capsule; Refill: 0 ?- predniSONE (STERAPRED UNI-PAK 21 TAB) 10 MG (21) TBPK tablet; 6 day taper;  take as directed on package instructions  Dispense: 21 tablet; Refill: 0 ?- EPINEPHrine 0.3 mg/0.3 mL IJ SOAJ injection; Inject 0.3 mg into the muscle as needed for anaphylaxis.  Dispense: 1 each; Refill: 0 ? ?3

## 2021-08-04 NOTE — Patient Instructions (Signed)
Tabitha HouseholderKathleen J [name], thank you for joining Tabitha LovelessJennifer M Alfredo Collymore, PA-C for today's virtual visit.  While this provider is not your primary care provider (PCP), if your PCP is located in our provider database this encounter information will be shared with them immediately following your visit. ? ?Consent: ?(Patient) Tabitha HouseholderKathleen J [name] provided verbal consent for this virtual visit at the beginning of the encounter. ? ?Current Medications: ? ?Current Outpatient Medications:  ?  cephALEXin (KEFLEX) 500 MG capsule, Take 1 capsule (500 mg total) by mouth 4 (four) times daily., Disp: 20 capsule, Rfl: 0 ?  EPINEPHrine 0.3 mg/0.3 mL IJ SOAJ injection, Inject 0.3 mg into the muscle as needed for anaphylaxis., Disp: 1 each, Rfl: 0 ?  predniSONE (STERAPRED UNI-PAK 21 TAB) 10 MG (21) TBPK tablet, 6 day taper; take as directed on package instructions, Disp: 21 tablet, Rfl: 0 ?  ACCU-CHEK GUIDE test strip, TEST TWICE DAILY, Disp: 100 strip, Rfl: 2 ?  albuterol (VENTOLIN HFA) 108 (90 Base) MCG/ACT inhaler, INHALE 2 PUFFS INTO THE LUNGS EVERY 6 HOURS AS NEEDED FOR WHEEZING OR SHORTNESS OF BREATH, Disp: 25.5 g, Rfl: 3 ?  atorvastatin (LIPITOR) 20 MG tablet, TAKE 1 TABLET(20 MG) BY MOUTH AT BEDTIME, Disp: 90 tablet, Rfl: 0 ?  divalproex (DEPAKOTE ER) 500 MG 24 hr tablet, Take 1 tablet (500 mg total) by mouth daily., Disp: 90 tablet, Rfl: 0 ?  doxycycline (VIBRAMYCIN) 50 MG capsule, TAKE 1 CAPSULE(50 MG) BY MOUTH EVERY MORNING, Disp: 90 capsule, Rfl: 0 ?  ezetimibe (ZETIA) 10 MG tablet, TAKE 1 TABLET(10 MG) BY MOUTH DAILY, Disp: 90 tablet, Rfl: 1 ?  fluticasone (FLOVENT HFA) 110 MCG/ACT inhaler, Inhale 1 puff into the lungs 2 (two) times daily., Disp: 1 Inhaler, Rfl: 11 ?  gabapentin (NEURONTIN) 600 MG tablet, Take 1 tablet (600 mg total) by mouth 3 (three) times daily., Disp: 270 tablet, Rfl: 0 ?  hydrochlorothiazide (HYDRODIURIL) 12.5 MG tablet, TAKE 1 TABLET(12.5 MG) BY MOUTH DAILY, Disp: 90 tablet, Rfl: 1 ?  ibuprofen (ADVIL) 200 MG  tablet, Take 200 mg by mouth every 6 (six) hours as needed., Disp: , Rfl:  ?  insulin degludec (TRESIBA FLEXTOUCH) 100 UNIT/ML FlexTouch Pen, INJECT 40 UNITS UNDER THE SKIN EVERY DAY, Disp: 15 mL, Rfl: 5 ?  Insulin Pen Needle (BD PEN NEEDLE NANO U/F) 32G X 4 MM MISC, 1 each by Does not apply route daily., Disp: 100 each, Rfl: 12 ?  MELATONIN PO, Take 5 mg by mouth at bedtime as needed., Disp: , Rfl:  ?  metFORMIN (GLUCOPHAGE) 500 MG tablet, TAKE 2 TABLETS(1000 MG) BY MOUTH TWICE DAILY WITH A MEAL, Disp: 360 tablet, Rfl: 1 ?  NOVOTWIST 32G X 5 MM MISC, USE WITH LEVEMIR PEN BID, Disp: , Rfl: 5 ?  OLANZapine (ZYPREXA) 5 MG tablet, Take 1 tablet (5 mg total) by mouth at bedtime., Disp: 90 tablet, Rfl: 0 ?  omeprazole (PRILOSEC) 10 MG capsule, TAKE 1 CAPSULE BY MOUTH DAILY, Disp: 90 capsule, Rfl: 1 ?  OZEMPIC, 1 MG/DOSE, 4 MG/3ML SOPN, INJECT 1 MG INTO THE SKIN ONCE A WEEK, Disp: 9 mL, Rfl: 0 ?  paliperidone (INVEGA SUSTENNA) 234 MG/1.5ML SUSY injection, Inject 234 mg into the muscle every 28 (twenty-eight) days., Disp: 1.5 mL, Rfl: 11 ?  phenazopyridine (PYRIDIUM) 200 MG tablet, Take 1 tablet (200 mg total) by mouth 3 (three) times daily as needed for pain., Disp: 10 tablet, Rfl: 0 ?  pioglitazone (ACTOS) 45 MG tablet, TAKE 1 TABLET(45 MG) BY  MOUTH DAILY, Disp: 90 tablet, Rfl: 1 ?  SUPER B COMPLEX/C PO, Take 1 tablet by mouth daily., Disp: , Rfl:  ?  Vilazodone HCl (VIIBRYD) 40 MG TABS, Take 1 tablet (40 mg total) by mouth daily., Disp: 90 tablet, Rfl: 1 ? ?Current Facility-Administered Medications:  ?  paliperidone (INVEGA SUSTENNA) injection 234 mg, 234 mg, Intramuscular, Q28 days, Hisada, Reina, MD, 234 mg at 07/03/21 1353  ? ?Medications ordered in this encounter:  ?Meds ordered this encounter  ?Medications  ? cephALEXin (KEFLEX) 500 MG capsule  ?  Sig: Take 1 capsule (500 mg total) by mouth 4 (four) times daily.  ?  Dispense:  20 capsule  ?  Refill:  0  ?  Order Specific Question:   Supervising Provider  ?  Answer:    Eber Hong [[id number]]  ? predniSONE (STERAPRED UNI-PAK 21 TAB) 10 MG (21) TBPK tablet  ?  Sig: 6 day taper; take as directed on package instructions  ?  Dispense:  21 tablet  ?  Refill:  0  ?  Order Specific Question:   Supervising Provider  ?  Answer:   Eber Hong [[id number]]  ? EPINEPHrine 0.3 mg/0.3 mL IJ SOAJ injection  ?  Sig: Inject 0.3 mg into the muscle as needed for anaphylaxis.  ?  Dispense:  1 each  ?  Refill:  0  ?  Order Specific Question:   Supervising Provider  ?  Answer:   Eber Hong [[id number]]  ?  ? ?*If you need refills on other medications prior to your next appointment, please contact your pharmacy* ? ?Follow-Up: ?Call back or seek an in-person evaluation if the symptoms worsen or if the condition fails to improve as anticipated. ? ?Other Instructions ? ?Bee, Wasp, or [employer], Adult ?Bees, wasps, and hornets are part of a family of insects that can sting people. These stings can cause pain and inflammation, but they are usually not serious. However, some people may have an allergic reaction to a sting. This can cause the symptoms to be more severe. ?What increases the risk? ?You may be at a greater risk of getting stung if you: ?Provoke a stinging insect by swatting or disturbing it. ?Wear strong-smelling soaps, deodorants, or body sprays. ?Spend time outdoors near gardens with flowers or fruit trees or in clothes that expose skin. ?Eat or drink outside. ?What are the signs or symptoms? ?Common symptoms of this condition include: ?A red lump in the skin that sometimes has a tiny hole in the center. In some cases, a stinger may be in the center of the wound. ?Pain and itching at the sting site. ?Redness and swelling around the sting site. If you have an allergic reaction (localized allergic reaction), the swelling and redness may spread out from the sting site. In some cases, this reaction can continue to develop over the next 24-48 hours. ?In rare cases, a person may have a severe allergic  reaction (anaphylactic reaction) to a sting. Symptoms of an anaphylactic reaction may include: ?Wheezing or difficulty breathing. ?Raised, itchy, red patches on the skin (hives). ?Nausea or vomiting. ?Abdominal cramping. ?Diarrhea. ?Tightness in the chest or chest pain. ?Dizziness or fainting. ?Redness of the face (flushing). ?Hoarse voice. ?Swollen tongue, lips, or face. ?How is this diagnosed? ?This condition is usually diagnosed based on your symptoms and medical history as well as a physical exam. You may have an allergy test to determine if you are allergic to the substance that the insect injected during the  sting (venom). ?How is this treated? ?If you were stung by a bee, the stinger and a small sac of venom may be in the wound. It is important to remove the stinger as soon as possible. You can do this by brushing across the wound with gauze, a fingernail, or a flat card such as a credit card. Removing the stinger can help reduce the severity of your body?s reaction to the sting. ?Most stings can be treated with: ?Icing to reduce swelling in the area. ?Medicines (antihistamines) to treat itching or an allergic reaction. ?Medicines to help reduce pain. These may be medicines that you take by mouth, or medicated creams or lotions that you apply to your skin. ?Pay close attention to your symptoms after you have been stung. If possible, have someone stay with you to make sure you do not have an allergic reaction. If you have any signs of an allergic reaction, call your health care provider. If you have ever had a severe allergic reaction, your health care provider may give you an inhaler or injectable medicine (epinephrine auto-injector) to use if necessary. ?Follow these instructions at home: ? ?Wash the sting site 2-3 times each day with soap and water as told by your health care provider. ?Apply or take over-the-counter and prescription medicines only as told by your health care provider. ?If directed, apply  ice to the sting area. ?Put ice in a plastic bag. ?Place a towel between your skin and the bag. ?Leave the ice on for 20 minutes, 2-3 times a day. ?Do not scratch the sting area. ?If you had a severe aller

## 2021-08-04 NOTE — Progress Notes (Signed)
Virtual Visit via Video Note ? ?I connected with Tabitha [name] on 08/07/21 at 11:30 AM EDT by a video enabled telemedicine application and verified that I am speaking with the correct person using two identifiers. ? ?Location: ?Patient: home ?Provider: office ?Persons participated in the visit- patient, provider  ?  ?I discussed the limitations of evaluation and management by telemedicine and the availability of in person appointments. The patient expressed understanding and agreed to proceed. ? ? ?  ?I discussed the assessment and treatment plan with the patient. The patient was provided an opportunity to ask questions and all were answered. The patient agreed with the plan and demonstrated an understanding of the instructions. ?  ?The patient was advised to call back or seek an in-person evaluation if the symptoms worsen or if the condition fails to improve as anticipated. ? ?I provided 15 minutes of non-face-to-face time during this encounter. ? ? ?[name], MD ? ? ? ?BH MD/PA/NP OP Progress Note ? ?08/07/2021 12:02 PM ?Tabitha [name]  ?MRN:  [id number] ? ?Chief Complaint:  ?Chief Complaint  ?Patient presents with  ? Follow-up  ? Other  ? ?HPI:  ?This is a follow-up appointment for schizoaffective disorder and an anxiety.  ?She states that she has been doing better when she was on Depakote.  She was feeling less moody and less anxious.  However, she ran out of the medication last Sunday.  Her husband will go to the pharmacy today to get the medication.  She states that something is going on every day when she was asked why she could not get the medication.  She states that "we have things going on.appointments and stuff like that."  She states that they are running all over the place.  She states that although she cannot talk about details about the court, it has been weighing on her.  She has an upcoming appointment for injection.  She has fair sleep.  She has increase in appetite.  She denies SI.  She  denies decreased need for sleep or euphonia.  She has not going to choir as she did not feel like doing so.  She has AH of people calling her, although she denies CAH.  She denies VH.  She agrees to obtain blood test for Depakote as advised at her last visit.  ? ? ?Visit Diagnosis:  ?  ICD-10-CM   ?1. Schizoaffective disorder, depressive type (HCC)  F25.1   ?  ?2. Mixed obsessional thoughts and acts  F42.2   ?  ?3. Anxiety state  F41.1   ?  ? ? ?Past Psychiatric History: Please see initial evaluation for full details. I have reviewed the history. No updates at this time.  ?  ? ?Past Medical History:  ?Past Medical History:  ?Diagnosis Date  ? Allergic rhinitis   ? Anxiety   ? Asthma   ? Bipolar disorder (HCC)   ? Depression   ? Diabetes mellitus without complication (HCC)   ? GERD (gastroesophageal reflux disease)   ? Hyperlipidemia   ? Hypertension   ? Mild sleep apnea   ? OCD (obsessive compulsive disorder)   ? Palpitations   ? Paranoia (HCC)   ? Schizoaffective disorder (HCC)   ? Sleep apnea   ?  ?Past Surgical History:  ?Procedure Laterality Date  ? CESAREAN SECTION  2006/2008  ? CHOLECYSTECTOMY  2006  ? ? ?Family Psychiatric History: Please see initial evaluation for full details. I have reviewed the history. No updates  at this time.  ?  ? ?Family History:  ?Family History  ?Problem Relation Age of Onset  ? Lupus Mother   ? Diabetes Mother   ?     pre diabetes  ? Diabetes Father   ? Hyperlipidemia Father   ? Diabetes Brother   ? Depression Paternal Grandmother   ? Bipolar disorder Paternal Grandmother   ? ? ?Social History:  ?Social History  ? ?Socioeconomic History  ? Marital status: Married  ?  Spouse name: Tabitha [name]  ? Number of children: 2  ? Years of education: 2816  ? Highest education level: Bachelor's degree (e.g., BA, AB, BS)  ?Occupational History  ? Occupation: disability  ?Tobacco Use  ? Smoking status: Never  ? Smokeless tobacco: Never  ?Vaping Use  ? Vaping Use: Never used  ?Substance and Sexual  Activity  ? Alcohol use: Not Currently  ? Drug use: Never  ? Sexual activity: Yes  ?  Partners: Male  ?  Birth control/protection: Surgical  ?Other Topics Concern  ? Not on file  ?Social History Narrative  ? Not on file  ? ?Social Determinants of Health  ? ?Financial Resource Strain: Low Risk   ? Difficulty of Paying Living Expenses: Not hard at all  ?Food Insecurity: No Food Insecurity  ? Worried About Programme researcher, broadcasting/film/videounning Out of Food in the Last Year: Never true  ? Ran Out of Food in the Last Year: Never true  ?Transportation Needs: No Transportation Needs  ? Lack of Transportation (Medical): No  ? Lack of Transportation (Non-Medical): No  ?Physical Activity: Inactive  ? Days of Exercise per Week: 0 days  ? Minutes of Exercise per Session: 0 min  ?Stress: Stress Concern Present  ? Feeling of Stress : To some extent  ?Social Connections: Not on file  ? ? ?Allergies:  ?Allergies  ?Allergen Reactions  ? Abilify [Aripiprazole] Hives  ? ? ?Metabolic Disorder Labs: ?Lab Results  ?Component Value Date  ? HGBA1C 7.6 (H) 05/21/2021  ? MPG 171 05/21/2021  ? MPG 166 01/08/2021  ? ?Lab Results  ?Component Value Date  ? PROLACTIN 84.8 (H) 06/01/2017  ? PROLACTIN 36.2 (H) 03/26/2016  ? ?Lab Results  ?Component Value Date  ? CHOL 140 05/21/2021  ? TRIG 236 (H) 05/21/2021  ? HDL 52 05/21/2021  ? CHOLHDL 2.7 05/21/2021  ? VLDL 51 (H) 04/23/2017  ? LDLCALC 59 05/21/2021  ? LDLCALC  01/08/2021  ?   Comment:  ?   . ?LDL cholesterol not calculated. Triglyceride levels ?greater than 400 mg/dL invalidate calculated LDL results. ?. ?Reference range: <100 ?Marland Kitchen. ?Desirable range <100 mg/dL for primary prevention;   ?<70 mg/dL for patients with CHD or diabetic patients  ?with > or = 2 CHD risk factors. ?. ?LDL-C is now calculated using the Martin-Hopkins  ?calculation, which is a validated novel method providing  ?better accuracy than the Friedewald equation in the  ?estimation of LDL-C.  ?Horald PollenMartin SS et al. Lenox AhrJAMA. 7425;956(382013;310(19): 2061-2068   ?(http://education.[facility].com/faq/FAQ164) ?  ? ?Lab Results  ?Component Value Date  ? TSH 1.290 04/28/2018  ? TSH 2.570 06/01/2017  ? ? ?Therapeutic Level Labs: ?No results found for: LITHIUM ?Lab Results  ?Component Value Date  ? VALPROATE 52 10/22/2017  ? VALPROATE 43 (L) 06/01/2017  ? ?No components found for:  CBMZ ? ?Current Medications: ?Current Outpatient Medications  ?Medication Sig Dispense Refill  ? ACCU-CHEK GUIDE test strip TEST TWICE DAILY 100 strip 2  ? albuterol (VENTOLIN HFA) 108 (90 Base)  MCG/ACT inhaler INHALE 2 PUFFS INTO THE LUNGS EVERY 6 HOURS AS NEEDED FOR WHEEZING OR SHORTNESS OF BREATH 25.5 g 3  ? atorvastatin (LIPITOR) 20 MG tablet TAKE 1 TABLET(20 MG) BY MOUTH AT BEDTIME 90 tablet 0  ? cephALEXin (KEFLEX) 500 MG capsule Take 1 capsule (500 mg total) by mouth 4 (four) times daily. 20 capsule 0  ? divalproex (DEPAKOTE ER) 500 MG 24 hr tablet Take 1 tablet (500 mg total) by mouth daily. 90 tablet 0  ? doxycycline (VIBRAMYCIN) 50 MG capsule TAKE 1 CAPSULE(50 MG) BY MOUTH EVERY MORNING 90 capsule 0  ? EPINEPHrine 0.3 mg/0.3 mL IJ SOAJ injection Inject 0.3 mg into the muscle as needed for anaphylaxis. 1 each 0  ? ezetimibe (ZETIA) 10 MG tablet TAKE 1 TABLET(10 MG) BY MOUTH DAILY 90 tablet 1  ? fluticasone (FLOVENT HFA) 110 MCG/ACT inhaler Inhale 1 puff into the lungs 2 (two) times daily. 1 Inhaler 11  ? gabapentin (NEURONTIN) 600 MG tablet Take 1 tablet (600 mg total) by mouth 3 (three) times daily. 270 tablet 0  ? hydrochlorothiazide (HYDRODIURIL) 12.5 MG tablet TAKE 1 TABLET(12.5 MG) BY MOUTH DAILY 90 tablet 1  ? ibuprofen (ADVIL) 200 MG tablet Take 200 mg by mouth every 6 (six) hours as needed.    ? insulin degludec (TRESIBA FLEXTOUCH) 100 UNIT/ML FlexTouch Pen INJECT 40 UNITS UNDER THE SKIN EVERY DAY 15 mL 5  ? Insulin Pen Needle (BD PEN NEEDLE NANO U/F) 32G X 4 MM MISC 1 each by Does not apply route daily. 100 each 12  ? MELATONIN PO Take 5 mg by mouth at bedtime as needed.    ?  metFORMIN (GLUCOPHAGE) 500 MG tablet TAKE 2 TABLETS(1000 MG) BY MOUTH TWICE DAILY WITH A MEAL 360 tablet 1  ? NOVOTWIST 32G X 5 MM MISC USE WITH LEVEMIR PEN BID  5  ? OLANZapine (ZYPREXA) 5 MG tablet Take 1 tablet (5 mg tota

## 2021-08-07 ENCOUNTER — Telehealth (INDEPENDENT_AMBULATORY_CARE_PROVIDER_SITE_OTHER): Payer: Medicare Other | Admitting: Psychiatry

## 2021-08-07 ENCOUNTER — Encounter: Payer: Self-pay | Admitting: Psychiatry

## 2021-08-07 DIAGNOSIS — F411 Generalized anxiety disorder: Secondary | ICD-10-CM | POA: Diagnosis not present

## 2021-08-07 DIAGNOSIS — F251 Schizoaffective disorder, depressive type: Secondary | ICD-10-CM

## 2021-08-07 DIAGNOSIS — F422 Mixed obsessional thoughts and acts: Secondary | ICD-10-CM

## 2021-08-11 ENCOUNTER — Ambulatory Visit (INDEPENDENT_AMBULATORY_CARE_PROVIDER_SITE_OTHER): Payer: Medicare Other

## 2021-08-11 DIAGNOSIS — F251 Schizoaffective disorder, depressive type: Secondary | ICD-10-CM

## 2021-08-11 DIAGNOSIS — F422 Mixed obsessional thoughts and acts: Secondary | ICD-10-CM | POA: Diagnosis not present

## 2021-08-11 DIAGNOSIS — F3170 Bipolar disorder, currently in remission, most recent episode unspecified: Secondary | ICD-10-CM | POA: Diagnosis not present

## 2021-08-11 NOTE — Patient Instructions (Signed)
pt was given invega sustenna 234mg /1.34ml in the right out quatient.   pt appears clean. Pt states is not doing well.  ?  ? ?s/n # 4m exp 09-24  lot # [id number]   NDC# [id number].    ?  ?Patient to return in 28 day for next injection.  ?

## 2021-08-11 NOTE — Progress Notes (Signed)
pt was given invega sustenna 234mg /1.41ml in the upper left out quatient.   pt appears clean. Pt talked normal. Pt states is not doing well.   ? ?s/n # [id number] exp 08-24  lot # [id number]   NDC# [phone].    ?  ?Patient to return in 28 day for next injection.  ?

## 2021-08-20 DIAGNOSIS — G4733 Obstructive sleep apnea (adult) (pediatric): Secondary | ICD-10-CM | POA: Diagnosis not present

## 2021-08-21 ENCOUNTER — Other Ambulatory Visit: Payer: Self-pay | Admitting: Internal Medicine

## 2021-08-22 NOTE — Telephone Encounter (Signed)
Requested Prescriptions  ?Pending Prescriptions Disp Refills  ?? hydrochlorothiazide (HYDRODIURIL) 12.5 MG tablet [Pharmacy Med Name: HYDROCHLOROTHIAZIDE 12.5MG  TABLETS] 90 tablet 1  ?  Sig: TAKE 1 TABLET(12.5 MG) BY MOUTH DAILY  ?  ? Cardiovascular: Diuretics - Thiazide Failed - 08/21/2021  2:07 PM  ?  ?  Failed - Last BP in normal range  ?  BP Readings from Last 1 Encounters:  ?05/21/21 132/61  ?   ?  ?  Passed - Cr in normal range and within 180 days  ?  Creat  ?Date Value Ref Range Status  ?05/21/2021 0.67 0.50 - 0.99 mg/dL Final  ? ?Creatinine, Urine  ?Date Value Ref Range Status  ?01/08/2021 23 20 - 275 mg/dL Final  ?   ?  ?  Passed - K in normal range and within 180 days  ?  Potassium  ?Date Value Ref Range Status  ?05/21/2021 4.1 3.5 - 5.3 mmol/L Final  ?   ?  ?  Passed - Na in normal range and within 180 days  ?  Sodium  ?Date Value Ref Range Status  ?05/21/2021 138 135 - 146 mmol/L Final  ?09/07/2019 137 134 - 144 mmol/L Final  ?   ?  ?  Passed - Valid encounter within last 6 months  ?  Recent Outpatient Visits   ?      ? 3 months ago Encounter for general adult medical examination with abnormal findings  ? [facility] [place], [place] W, NP  ? 7 months ago Influenza vaccine needed  ? [facility] [place], [place] W, NP  ? 8 months ago COVID-19  ? [facility] [place], [place]alie Keens, NP  ? 1 year ago Encounter to establish care with new doctor  ? [place], FNP  ? 1 year ago Acute lower UTI  ? [place], [place], [place]  ?  ?  ?Future Appointments   ?        ? In 2 weeks [place]   ?  ? ?  ?  ?  ? ?

## 2021-08-28 ENCOUNTER — Ambulatory Visit (INDEPENDENT_AMBULATORY_CARE_PROVIDER_SITE_OTHER): Payer: Medicare Other | Admitting: Internal Medicine

## 2021-08-28 ENCOUNTER — Encounter: Payer: Self-pay | Admitting: Internal Medicine

## 2021-08-28 VITALS — BP 136/74 | HR 95 | Temp 97.1°F | Wt 246.0 lb

## 2021-08-28 DIAGNOSIS — R300 Dysuria: Secondary | ICD-10-CM | POA: Diagnosis not present

## 2021-08-28 DIAGNOSIS — R102 Pelvic and perineal pain: Secondary | ICD-10-CM

## 2021-08-28 DIAGNOSIS — R8290 Unspecified abnormal findings in urine: Secondary | ICD-10-CM

## 2021-08-28 DIAGNOSIS — R3915 Urgency of urination: Secondary | ICD-10-CM | POA: Diagnosis not present

## 2021-08-28 DIAGNOSIS — N3000 Acute cystitis without hematuria: Secondary | ICD-10-CM | POA: Diagnosis not present

## 2021-08-28 LAB — POCT URINALYSIS DIPSTICK
Bilirubin, UA: NEGATIVE
Glucose, UA: POSITIVE — AB
Nitrite, UA: POSITIVE
Protein, UA: POSITIVE — AB
Spec Grav, UA: 1.01 (ref 1.010–1.025)
Urobilinogen, UA: 0.2 E.U./dL
pH, UA: 5 (ref 5.0–8.0)

## 2021-08-28 MED ORDER — NITROFURANTOIN MONOHYD MACRO 100 MG PO CAPS: 100.0000 mg | ORAL_CAPSULE | Freq: Two times a day (BID) | ORAL | 0 refills | Status: DC

## 2021-08-28 NOTE — Patient Instructions (Signed)
Urinary Tract Infection, Adult  A urinary tract infection (UTI) is an infection of any part of the urinary tract. The urinary tract includes the kidneys, ureters, bladder, and urethra. These organs make, store, and get rid of urine in the body. An upper UTI affects the ureters and kidneys. A lower UTI affects the bladder and urethra. What are the causes? Most urinary tract infections are caused by bacteria in your genital area around your urethra, where urine leaves your body. These bacteria grow and cause inflammation of your urinary tract. What increases the risk? You are more likely to develop this condition if: You have a urinary catheter that stays in place. You are not able to control when you urinate or have a bowel movement (incontinence). You are female and you: Use a spermicide or diaphragm for birth control. Have low estrogen levels. Are pregnant. You have certain genes that increase your risk. You are sexually active. You take antibiotic medicines. You have a condition that causes your flow of urine to slow down, such as: An enlarged prostate, if you are female. Blockage in your urethra. A kidney stone. A nerve condition that affects your bladder control (neurogenic bladder). Not getting enough to drink, or not urinating often. You have certain medical conditions, such as: Diabetes. A weak disease-fighting system (immunesystem). Sickle cell disease. Gout. Spinal cord injury. What are the signs or symptoms? Symptoms of this condition include: Needing to urinate right away (urgency). Frequent urination. This may include small amounts of urine each time you urinate. Pain or burning with urination. Blood in the urine. Urine that smells bad or unusual. Trouble urinating. Cloudy urine. Vaginal discharge, if you are female. Pain in the abdomen or the lower back. You may also have: Vomiting or a decreased appetite. Confusion. Irritability or tiredness. A fever or  chills. Diarrhea. The first symptom in older adults may be confusion. In some cases, they may not have any symptoms until the infection has worsened. How is this diagnosed? This condition is diagnosed based on your medical history and a physical exam. You may also have other tests, including: Urine tests. Blood tests. Tests for STIs (sexually transmitted infections). If you have had more than one UTI, a cystoscopy or imaging studies may be done to determine the cause of the infections. How is this treated? Treatment for this condition includes: Antibiotic medicine. Over-the-counter medicines to treat discomfort. Drinking enough water to stay hydrated. If you have frequent infections or have other conditions such as a kidney stone, you may need to see a health care provider who specializes in the urinary tract (urologist). In rare cases, urinary tract infections can cause sepsis. Sepsis is a life-threatening condition that occurs when the body responds to an infection. Sepsis is treated in the hospital with IV antibiotics, fluids, and other medicines. Follow these instructions at home:  Medicines Take over-the-counter and prescription medicines only as told by your health care provider. If you were prescribed an antibiotic medicine, take it as told by your health care provider. Do not stop using the antibiotic even if you start to feel better. General instructions Make sure you: Empty your bladder often and completely. Do not hold urine for long periods of time. Empty your bladder after sex. Wipe from front to back after urinating or having a bowel movement if you are female. Use each tissue only one time when you wipe. Drink enough fluid to keep your urine pale yellow. Keep all follow-up visits. This is important. Contact a health   care provider if: Your symptoms do not get better after 1-2 days. Your symptoms go away and then return. Get help right away if: You have severe pain in  your back or your lower abdomen. You have a fever or chills. You have nausea or vomiting. Summary A urinary tract infection (UTI) is an infection of any part of the urinary tract, which includes the kidneys, ureters, bladder, and urethra. Most urinary tract infections are caused by bacteria in your genital area. Treatment for this condition often includes antibiotic medicines. If you were prescribed an antibiotic medicine, take it as told by your health care provider. Do not stop using the antibiotic even if you start to feel better. Keep all follow-up visits. This is important. This information is not intended to replace advice given to you by your health care provider. Make sure you discuss any questions you have with your health care provider. Document Revised: 11/24/2019 Document Reviewed: 11/24/2019 Elsevier Patient Education  2023 Elsevier Inc.  

## 2021-08-28 NOTE — Progress Notes (Signed)
HPI ? ?Pt presents to the clinic today with c/o left side pelvic pain, low back, urgency, dysuria and urine odor. She reports this started 2 weeks ago. She denies frequency, blood in her urine, vaginal discharge, vaginal irritation, vaginal odor or abnormal vaginal bleeding.  She has had some slight nausea and chronic diarrhea but denies vomiting, blood in her stool, fever or chills.  She has tried AZO OTC with minimal relief of symptoms. ? ? ?Review of Systems ? ?Past Medical History:  ?Diagnosis Date  ? Allergic rhinitis   ? Anxiety   ? Asthma   ? Bipolar disorder ([place])   ? Depression   ? Diabetes mellitus without complication ([place])   ? GERD (gastroesophageal reflux disease)   ? Hyperlipidemia   ? Hypertension   ? Mild sleep apnea   ? OCD (obsessive compulsive disorder)   ? Palpitations   ? Paranoia ([place])   ? Schizoaffective disorder ([place])   ? Sleep apnea   ? ? ?Family History  ?Problem Relation Age of Onset  ? Lupus Mother   ? Diabetes Mother   ?     pre diabetes  ? Diabetes Father   ? Hyperlipidemia Father   ? Diabetes Brother   ? Depression Paternal Grandmother   ? Bipolar disorder Paternal Grandmother   ? ? ?Social History  ? ?Socioeconomic History  ? Marital status: Married  ?  Spouse name: Eliese Shoji  ? Number of children: 2  ? Years of education: 67  ? Highest education level: Bachelor's degree (e.g., BA, AB, BS)  ?Occupational History  ? Occupation: disability  ?Tobacco Use  ? Smoking status: Never  ? Smokeless tobacco: Never  ?Vaping Use  ? Vaping Use: Never used  ?Substance and Sexual Activity  ? Alcohol use: Not Currently  ? Drug use: Never  ? Sexual activity: Yes  ?  Partners: Male  ?  Birth control/protection: Surgical  ?Other Topics Concern  ? Not on file  ?Social History Narrative  ? Not on file  ? ?Social Determinants of Health  ? ?Financial Resource Strain: Low Risk   ? Difficulty of Paying Living Expenses: Not hard at all  ?Food Insecurity: No Food Insecurity  ? Worried About Charity fundraiser  in the Last Year: Never true  ? Ran Out of Food in the Last Year: Never true  ?Transportation Needs: No Transportation Needs  ? Lack of Transportation (Medical): No  ? Lack of Transportation (Non-Medical): No  ?Physical Activity: Inactive  ? Days of Exercise per Week: 0 days  ? Minutes of Exercise per Session: 0 min  ?Stress: Stress Concern Present  ? Feeling of Stress : To some extent  ?Social Connections: Not on file  ?Intimate Partner Violence: Not on file  ? ? ?Allergies  ?Allergen Reactions  ? Abilify [Aripiprazole] Hives  ? ?  ?Constitutional: Denies fever, malaise, fatigue, headache or abrupt weight changes.   ?GU: Pt reports pelvic pain, urgency, pain with urination, urine odor. Denies burning sensation, blood in urine, vaginal itching, irritation or or discharge. ?MSK: Patient reports low back pain.  Denies decrease in range of motion, joint swelling or difficulty with gait. ?Skin: Denies redness, rashes, lesions or ulcercations.  ? ?No other specific complaints in a complete review of systems (except as listed in HPI above). ? ?  ?Objective:  ? Physical Exam ? ?BP 136/74 (BP Location: Left Arm, Patient Position: Sitting, Cuff Size: Large)   Pulse 95   Temp (!) 97.1 ?F (  36.2 ?C) (Temporal)   Wt 246 lb (111.6 kg)   SpO2 99%   BMI 44.99 kg/m?  ? ?Wt Readings from Last 3 Encounters:  ?05/21/21 241 lb (109.3 kg)  ?01/07/21 240 lb 9.6 oz (109.1 kg)  ?09/03/20 242 lb (109.8 kg)  ? ? ?General: Appears her stated age, obese, in NAD. ?Cardiovascular: Normal rate and rhythm. S1,S2 noted.   ?Pulmonary/Chest: Normal effort and positive vesicular breath sounds. No respiratory distress. No wheezes, rales or ronchi noted.  ?Abdomen: Soft. Normal bowel sounds. No distention or masses noted.  Tender to palpation in bilateral lower quadrants. No CVA tenderness. ?MSK: Bony tenderness noted over the lumbar spine.  No difficulty with gait ? ? ?     ?Assessment & Plan:  ? ?Urgency, Dysuria, Urine Odor, Pelvic Pain, Low Back  Pain secondary to UTI: ? ?Urinalysis: moderate lueks, moderate blood, + nitrites ?Will send urine culture ?Rx sent if for Macrobid 100 mg BID x 5 days ?OK to take AZO OTC ?Drink plenty of fluids ? ?RTC in 3 months for follow-up of chronic conditions ?Webb Silversmith, NP ? ?

## 2021-08-30 LAB — URINE CULTURE
MICRO NUMBER:: 13354397
SPECIMEN QUALITY:: ADEQUATE

## 2021-09-01 ENCOUNTER — Encounter: Payer: Self-pay | Admitting: Internal Medicine

## 2021-09-01 MED ORDER — SULFAMETHOXAZOLE-TRIMETHOPRIM 400-80 MG PO TABS: 1.0000 | ORAL_TABLET | Freq: Two times a day (BID) | ORAL | 0 refills | Status: DC

## 2021-09-08 ENCOUNTER — Other Ambulatory Visit
Admission: RE | Admit: 2021-09-08 | Discharge: 2021-09-08 | Disposition: A | Discharge status: Home / Self Care | Payer: Medicare Other | Source: Ambulatory Visit | Attending: Psychiatry | Admitting: Psychiatry

## 2021-09-08 ENCOUNTER — Encounter: Payer: Self-pay | Admitting: Psychiatry

## 2021-09-08 ENCOUNTER — Ambulatory Visit (INDEPENDENT_AMBULATORY_CARE_PROVIDER_SITE_OTHER): Payer: Medicare Other

## 2021-09-08 ENCOUNTER — Telehealth: Payer: Self-pay | Admitting: Psychiatry

## 2021-09-08 ENCOUNTER — Ambulatory Visit (INDEPENDENT_AMBULATORY_CARE_PROVIDER_SITE_OTHER): Payer: Medicare Other | Admitting: Psychiatry

## 2021-09-08 VITALS — BP 157/97 | HR 96 | Temp 98.3°F | Wt 244.2 lb

## 2021-09-08 DIAGNOSIS — F251 Schizoaffective disorder, depressive type: Secondary | ICD-10-CM

## 2021-09-08 DIAGNOSIS — F3170 Bipolar disorder, currently in remission, most recent episode unspecified: Secondary | ICD-10-CM | POA: Diagnosis not present

## 2021-09-08 DIAGNOSIS — F411 Generalized anxiety disorder: Secondary | ICD-10-CM | POA: Diagnosis not present

## 2021-09-08 DIAGNOSIS — F422 Mixed obsessional thoughts and acts: Secondary | ICD-10-CM | POA: Diagnosis not present

## 2021-09-08 DIAGNOSIS — F429 Obsessive-compulsive disorder, unspecified: Secondary | ICD-10-CM | POA: Diagnosis not present

## 2021-09-08 LAB — HEPATIC FUNCTION PANEL
ALT: 26 U/L (ref 0–44)
AST: 29 U/L (ref 15–41)
Albumin: 3.8 g/dL (ref 3.5–5.0)
Alkaline Phosphatase: 98 U/L (ref 38–126)
Bilirubin, Direct: <0.1 mg/dL (ref 0.0–0.2)
Total Bilirubin: 0.4 mg/dL (ref 0.3–1.2)
Total Protein: 6.6 g/dL (ref 6.5–8.1)

## 2021-09-08 NOTE — Telephone Encounter (Signed)
Called and left a message for pt that depakote levels and that when she is back out this area to get her labs work done. ?

## 2021-09-08 NOTE — Telephone Encounter (Signed)
Depakote level is not checked on today's blood test, although the order was sent in. Please advice the patient to have another blood test unless [facility] is able to add on. Thanks! ?

## 2021-09-08 NOTE — Patient Instructions (Signed)
pt was given invega sustenna 234mg /1.69ml in the upper right out quatient.   pt appears clean but also tearful she states she feels overwhelmed at home and seemed upset. Pt talked normal. Pt states is not doing well made appt to see today   ?  ?s/n # 4m exp 10-24  lot # [id number]   NDC# [id number] ?

## 2021-09-08 NOTE — Progress Notes (Signed)
BH MD/PA/NP OP Progress Note ? ?09/08/2021 10:17 AM ?[name]  ?MRN:  [id number] ? ?Chief Complaint:  ?Chief Complaint  ?Patient presents with  ? Follow-up  ? Stress  ? Other  ? ?HPI:  ?This is a follow-up appointment for schizoaffective disorder.  ?This appointment was made urgently due to worsening in her mood symptoms.  ?She states that she is not doing well, not feeling like herself.  She feels down and hopeless.  She tends to sleep during the day, and she feels more restless at night.  Her husband has an upcoming court in June.  He has been stressed, and she feels stressed about this.  She has depressive symptoms as in PHQ-9.  Although she has passive SI, she adamantly denies any plan or intent.  She feels anxious.  She denies CAH.  She has AH of her family calling her names.  She denies VH.  She denies decreased need for sleep or euphonia.  She takes medication regularly.  She is willing to try higher dose of olanzapine at this time.  ? ? ?Wt Readings from Last 3 Encounters:  ?09/08/21 244 lb 3.2 oz (110.8 kg)  ?09/08/21 244 lb 3.2 oz (110.8 kg)  ?08/28/21 246 lb (111.6 kg)  ?  ? ?Visit Diagnosis:  ?  ICD-10-CM   ?1. Schizoaffective disorder, depressive type (HCC)  F25.1 Valproic acid level  ?  Hepatic function panel  ?  ?2. Obsessive-compulsive disorder, unspecified type  F42.9   ?  ?3. Anxiety state  F41.1   ?  ? ? ?Past Psychiatric History: Please see initial evaluation for full details. I have reviewed the history. No updates at this time.  ?  ? ?Past Medical History:  ?Past Medical History:  ?Diagnosis Date  ? Allergic rhinitis   ? Anxiety   ? Asthma   ? Bipolar disorder (HCC)   ? Depression   ? Diabetes mellitus without complication (HCC)   ? GERD (gastroesophageal reflux disease)   ? Hyperlipidemia   ? Hypertension   ? Mild sleep apnea   ? OCD (obsessive compulsive disorder)   ? Palpitations   ? Paranoia (HCC)   ? Schizoaffective disorder (HCC)   ? Sleep apnea   ?  ?Past Surgical History:   ?Procedure Laterality Date  ? CESAREAN SECTION  2006/2008  ? CHOLECYSTECTOMY  2006  ? ? ?Family Psychiatric History: Please see initial evaluation for full details. I have reviewed the history. No updates at this time.  ?  ? ?Family History:  ?Family History  ?Problem Relation Age of Onset  ? Lupus Mother   ? Diabetes Mother   ?     pre diabetes  ? Diabetes Father   ? Hyperlipidemia Father   ? Diabetes Brother   ? Depression Paternal Grandmother   ? Bipolar disorder Paternal Grandmother   ? ? ?Social History:  ?Social History  ? ?Socioeconomic History  ? Marital status: Married  ?  Spouse name: Scarlett Portlock  ? Number of children: 2  ? Years of education: 48  ? Highest education level: Bachelor's degree (e.g., BA, AB, BS)  ?Occupational History  ? Occupation: disability  ?Tobacco Use  ? Smoking status: Never  ? Smokeless tobacco: Never  ?Vaping Use  ? Vaping Use: Never used  ?Substance and Sexual Activity  ? Alcohol use: Not Currently  ? Drug use: Never  ? Sexual activity: Yes  ?  Partners: Male  ?  Birth control/protection: Surgical  ?Other Topics Concern  ?  Not on file  ?Social History Narrative  ? Not on file  ? ?Social Determinants of Health  ? ?Financial Resource Strain: Low Risk   ? Difficulty of Paying Living Expenses: Not hard at all  ?Food Insecurity: No Food Insecurity  ? Worried About Programme researcher, broadcasting/film/video in the Last Year: Never true  ? Ran Out of Food in the Last Year: Never true  ?Transportation Needs: No Transportation Needs  ? Lack of Transportation (Medical): No  ? Lack of Transportation (Non-Medical): No  ?Physical Activity: Inactive  ? Days of Exercise per Week: 0 days  ? Minutes of Exercise per Session: 0 min  ?Stress: Stress Concern Present  ? Feeling of Stress : To some extent  ?Social Connections: Not on file  ? ? ?Allergies:  ?Allergies  ?Allergen Reactions  ? Abilify [Aripiprazole] Hives  ? ? ?Metabolic Disorder Labs: ?Lab Results  ?Component Value Date  ? HGBA1C 7.6 (H) 05/21/2021  ? MPG 171  05/21/2021  ? MPG 166 01/08/2021  ? ?Lab Results  ?Component Value Date  ? PROLACTIN 84.8 (H) 06/01/2017  ? PROLACTIN 36.2 (H) 03/26/2016  ? ?Lab Results  ?Component Value Date  ? CHOL 140 05/21/2021  ? TRIG 236 (H) 05/21/2021  ? HDL 52 05/21/2021  ? CHOLHDL 2.7 05/21/2021  ? VLDL 51 (H) 04/23/2017  ? LDLCALC 59 05/21/2021  ? LDLCALC  01/08/2021  ?   Comment:  ?   . ?LDL cholesterol not calculated. Triglyceride levels ?greater than 400 mg/dL invalidate calculated LDL results. ?. ?Reference range: <100 ?Marland Kitchen ?Desirable range <100 mg/dL for primary prevention;   ?<70 mg/dL for patients with CHD or diabetic patients  ?with > or = 2 CHD risk factors. ?. ?LDL-C is now calculated using the Martin-Hopkins  ?calculation, which is a validated novel method providing  ?better accuracy than the Friedewald equation in the  ?estimation of LDL-C.  ?Horald Pollen et al. Lenox Ahr. 1324;401(02): 2061-2068  ?(http://education.[facility].com/faq/FAQ164) ?  ? ?Lab Results  ?Component Value Date  ? TSH 1.290 04/28/2018  ? TSH 2.570 06/01/2017  ? ? ?Therapeutic Level Labs: ?No results found for: LITHIUM ?Lab Results  ?Component Value Date  ? VALPROATE 52 10/22/2017  ? VALPROATE 43 (L) 06/01/2017  ? ?No components found for:  CBMZ ? ?Current Medications: ?Current Outpatient Medications  ?Medication Sig Dispense Refill  ? ACCU-CHEK GUIDE test strip TEST TWICE DAILY 100 strip 2  ? albuterol (VENTOLIN HFA) 108 (90 Base) MCG/ACT inhaler INHALE 2 PUFFS INTO THE LUNGS EVERY 6 HOURS AS NEEDED FOR WHEEZING OR SHORTNESS OF BREATH 25.5 g 3  ? atorvastatin (LIPITOR) 20 MG tablet TAKE 1 TABLET(20 MG) BY MOUTH AT BEDTIME 90 tablet 0  ? divalproex (DEPAKOTE ER) 500 MG 24 hr tablet Take 1 tablet (500 mg total) by mouth daily. 90 tablet 0  ? EPINEPHrine 0.3 mg/0.3 mL IJ SOAJ injection Inject 0.3 mg into the muscle as needed for anaphylaxis. 1 each 0  ? ezetimibe (ZETIA) 10 MG tablet TAKE 1 TABLET(10 MG) BY MOUTH DAILY 90 tablet 1  ? fluticasone (FLOVENT HFA)  110 MCG/ACT inhaler Inhale 1 puff into the lungs 2 (two) times daily. 1 Inhaler 11  ? gabapentin (NEURONTIN) 600 MG tablet Take 1 tablet (600 mg total) by mouth 3 (three) times daily. 270 tablet 0  ? hydrochlorothiazide (HYDRODIURIL) 12.5 MG tablet TAKE 1 TABLET(12.5 MG) BY MOUTH DAILY 90 tablet 1  ? ibuprofen (ADVIL) 200 MG tablet Take 200 mg by mouth every 6 (six) hours as  needed.    ? insulin degludec (TRESIBA FLEXTOUCH) 100 UNIT/ML FlexTouch Pen INJECT 40 UNITS UNDER THE SKIN EVERY DAY 15 mL 5  ? Insulin Pen Needle (BD PEN NEEDLE NANO U/F) 32G X 4 MM MISC 1 each by Does not apply route daily. 100 each 12  ? MELATONIN PO Take 5 mg by mouth at bedtime as needed.    ? metFORMIN (GLUCOPHAGE) 500 MG tablet TAKE 2 TABLETS(1000 MG) BY MOUTH TWICE DAILY WITH A MEAL 360 tablet 1  ? nitrofurantoin, macrocrystal-monohydrate, (MACROBID) 100 MG capsule Take 1 capsule (100 mg total) by mouth 2 (two) times daily. 10 capsule 0  ? NOVOTWIST 32G X 5 MM MISC USE WITH LEVEMIR PEN BID  5  ? OLANZapine (ZYPREXA) 5 MG tablet Take 1 tablet (5 mg total) by mouth at bedtime. 90 tablet 0  ? omeprazole (PRILOSEC) 10 MG capsule TAKE 1 CAPSULE BY MOUTH DAILY 90 capsule 1  ? OZEMPIC, 1 MG/DOSE, 4 MG/3ML SOPN INJECT 1 MG INTO THE SKIN ONCE A WEEK 9 mL 0  ? paliperidone (INVEGA SUSTENNA) 234 MG/1.5ML SUSY injection Inject 234 mg into the muscle every 28 (twenty-eight) days. 1.5 mL 11  ? pioglitazone (ACTOS) 45 MG tablet TAKE 1 TABLET(45 MG) BY MOUTH DAILY 90 tablet 1  ? sulfamethoxazole-trimethoprim (BACTRIM) 400-80 MG tablet Take 1 tablet by mouth 2 (two) times daily. 10 tablet 0  ? SUPER B COMPLEX/C PO Take 1 tablet by mouth daily.    ? Vilazodone HCl (VIIBRYD) 40 MG TABS Take 1 tablet (40 mg total) by mouth daily. 90 tablet 1  ? ?Current Facility-Administered Medications  ?Medication Dose Route Frequency Provider Last Rate Last Admin  ? paliperidone (INVEGA SUSTENNA) injection 234 mg  234 mg Intramuscular Q28 days Neysa HotterHisada, Khloee Garza, MD   234 mg  at 09/08/21 [id number]  ? ? ? ?Musculoskeletal: ?Strength & Muscle Tone: within normal limits ?Gait & Station: normal ?Patient leans: N/A ? ?Psychiatric Specialty Exam: ?Review of Systems  ?Psychiatric/Behavioral:  P

## 2021-09-08 NOTE — Patient Instructions (Signed)
Continue monthly invega 234 mg IM  ?Increase olanzapine 7.5 mg at night  ?Continue Depakote 500 mg daily ?Obtain labs (VPA, CBC, LFT) ?Continue Viibryd 40 mg daily ?Continue gabapentin 600 mg 3 times a day ?Next appointment- 6/7 at 11:30 ?

## 2021-09-09 ENCOUNTER — Ambulatory Visit: Payer: Medicare Other

## 2021-09-09 ENCOUNTER — Telehealth: Payer: Self-pay

## 2021-09-09 NOTE — Telephone Encounter (Signed)
left message to check on the patient and also see if she received my message about having the additonal labwork done.  ?

## 2021-09-11 ENCOUNTER — Ambulatory Visit (INDEPENDENT_AMBULATORY_CARE_PROVIDER_SITE_OTHER): Payer: Medicare Other

## 2021-09-11 DIAGNOSIS — Z0000 Encounter for general adult medical examination without abnormal findings: Secondary | ICD-10-CM

## 2021-09-11 NOTE — Progress Notes (Signed)
Subjective:  I connected with Tabitha [name] today by telephone and verified that I am speaking with the correct person using two identifiers. Location patient: home Location provider: work Persons participating in the virtual visit: Tabitha [name] ,CMA, Tabitha [name]   I discussed the limitations, risks, security and privacy concerns of performing an evaluation and management service by telephone and the availability of in person appointments. I also discussed with the patient that there may be a patient responsible charge related to this service. The patient expressed understanding and verbally consented to this telephonic visit.      Tabitha [name] is a 51 y.o. female who presents for Medicare Annual (Subsequent) preventive examination.  Review of Systems    Per HPI unless specifically indicated below         Objective:    There were no vitals filed for this visit. There is no height or weight on file to calculate BMI.  Wt Readings from Last 3 Encounters:  08/28/21 246 lb (111.6 kg)  05/21/21 241 lb (109.3 kg)  01/07/21 240 lb 9.6 oz (109.1 kg)   Temp Readings from Last 3 Encounters:  08/28/21 (!) 97.1 F (36.2 C) (Temporal)  05/21/21 98.2 F (36.8 C) (Oral)  01/07/21 (!) 97.5 F (36.4 C) (Temporal)   BP Readings from Last 3 Encounters:  08/28/21 136/74  05/21/21 132/61  01/07/21 (!) 129/57   Pulse Readings from Last 3 Encounters:  08/28/21 95  05/21/21 85  01/07/21 81        09/03/2020    2:42 PM 01/19/2019    2:04 PM 06/06/2018    2:52 PM 01/13/2018    2:43 PM 02/15/2017   11:59 AM 01/18/2017   10:52 AM 01/18/2017   10:31 AM  Advanced Directives  Does Patient Have a Medical Advance Directive? No No  No     Would patient like information on creating a medical advance directive?    Yes (MAU/Ambulatory/Procedural Areas - Information given)        Information is confidential and restricted. Go to Review Flowsheets to unlock data.    Current  Medications (verified) Outpatient Encounter Medications as of 09/11/2021  Medication Sig   ACCU-CHEK GUIDE test strip TEST TWICE DAILY   albuterol (VENTOLIN HFA) 108 (90 Base) MCG/ACT inhaler INHALE 2 PUFFS INTO THE LUNGS EVERY 6 HOURS AS NEEDED FOR WHEEZING OR SHORTNESS OF BREATH   atorvastatin (LIPITOR) 20 MG tablet TAKE 1 TABLET(20 MG) BY MOUTH AT BEDTIME   divalproex (DEPAKOTE ER) 500 MG 24 hr tablet Take 1 tablet (500 mg total) by mouth daily.   EPINEPHrine 0.3 mg/0.3 mL IJ SOAJ injection Inject 0.3 mg into the muscle as needed for anaphylaxis.   ezetimibe (ZETIA) 10 MG tablet TAKE 1 TABLET(10 MG) BY MOUTH DAILY   fluticasone (FLOVENT HFA) 110 MCG/ACT inhaler Inhale 1 puff into the lungs 2 (two) times daily.   gabapentin (NEURONTIN) 600 MG tablet Take 1 tablet (600 mg total) by mouth 3 (three) times daily.   hydrochlorothiazide (HYDRODIURIL) 12.5 MG tablet TAKE 1 TABLET(12.5 MG) BY MOUTH DAILY   ibuprofen (ADVIL) 200 MG tablet Take 200 mg by mouth every 6 (six) hours as needed.   insulin degludec (TRESIBA FLEXTOUCH) 100 UNIT/ML FlexTouch Pen INJECT 40 UNITS UNDER THE SKIN EVERY DAY   Insulin Pen Needle (BD PEN NEEDLE NANO U/F) 32G X 4 MM MISC 1 each by Does not apply route daily.   MELATONIN PO Take 5 mg by mouth at bedtime as needed.  metFORMIN (GLUCOPHAGE) 500 MG tablet TAKE 2 TABLETS(1000 MG) BY MOUTH TWICE DAILY WITH A MEAL   NOVOTWIST 32G X 5 MM MISC USE WITH LEVEMIR PEN BID   OLANZapine (ZYPREXA) 5 MG tablet Take 1 tablet (5 mg total) by mouth at bedtime.   omeprazole (PRILOSEC) 10 MG capsule TAKE 1 CAPSULE BY MOUTH DAILY   OZEMPIC, 1 MG/DOSE, 4 MG/3ML SOPN INJECT 1 MG INTO THE SKIN ONCE A WEEK   paliperidone (INVEGA SUSTENNA) 234 MG/1.5ML SUSY injection Inject 234 mg into the muscle every 28 (twenty-eight) days.   pioglitazone (ACTOS) 45 MG tablet TAKE 1 TABLET(45 MG) BY MOUTH DAILY   sulfamethoxazole-trimethoprim (BACTRIM) 400-80 MG tablet Take 1 tablet by mouth 2 (two) times  daily.   Vilazodone HCl (VIIBRYD) 40 MG TABS Take 1 tablet (40 mg total) by mouth daily.   SUPER B COMPLEX/C PO Take 1 tablet by mouth daily. (Patient not taking: Reported on 09/11/2021)   [DISCONTINUED] nitrofurantoin, macrocrystal-monohydrate, (MACROBID) 100 MG capsule Take 1 capsule (100 mg total) by mouth 2 (two) times daily. (Patient not taking: Reported on 09/11/2021)   Facility-Administered Encounter Medications as of 09/11/2021  Medication   paliperidone (INVEGA SUSTENNA) injection 234 mg    Allergies (verified) Abilify [aripiprazole]   History: Past Medical History:  Diagnosis Date   Allergic rhinitis    Anxiety    Asthma    Bipolar disorder (HCC)    Depression    Diabetes mellitus without complication (HCC)    GERD (gastroesophageal reflux disease)    Hyperlipidemia    Hypertension    Mild sleep apnea    OCD (obsessive compulsive disorder)    Palpitations    Paranoia (HCC)    Schizoaffective disorder (HCC)    Sleep apnea    Past Surgical History:  Procedure Laterality Date   CESAREAN SECTION  2006/2008   CHOLECYSTECTOMY  2006   Family History  Problem Relation Age of Onset   Lupus Mother    Diabetes Mother        pre diabetes   Diabetes Father    Hyperlipidemia Father    Diabetes Brother    Depression Paternal Grandmother    Bipolar disorder Paternal Grandmother    Social History   Socioeconomic History   Marital status: Married    Spouse name: Tabitha [name]   Number of children: 2   Years of education: 16   Highest education level: Bachelor's degree (e.g., BA, AB, BS)  Occupational History   Occupation: disability  Tobacco Use   Smoking status: Never   Smokeless tobacco: Never  Vaping Use   Vaping Use: Never used  Substance and Sexual Activity   Alcohol use: Not Currently   Drug use: Never   Sexual activity: Yes    Partners: Male    Birth control/protection: Surgical  Other Topics Concern   Not on file  Social History Narrative   Not on  file   Social Determinants of Health   Financial Resource Strain: Low Risk    Difficulty of Paying Living Expenses: Not hard at all  Food Insecurity: No Food Insecurity   Worried About Programme researcher, broadcasting/film/video in the Last Year: Never true   Ran Out of Food in the Last Year: Never true  Transportation Needs: No Transportation Needs   Lack of Transportation (Medical): No   Lack of Transportation (Non-Medical): No  Physical Activity: Inactive   Days of Exercise per Week: 0 days   Minutes of Exercise per Session: 0 min  Stress: Stress Concern Present   Feeling of Stress : Rather much  Social Connections: Socially Integrated   Frequency of Communication with Friends and Family: Once a week   Frequency of Social Gatherings with Friends and Family: More than three times a week   Attends Religious Services: More than 4 times per year   Active Member of [employer] or Organizations: Yes   Attends Engineer, structural: More than 4 times per year   Marital Status: Married    Tobacco Counseling Counseling given: Not Answered   Clinical Intake:  Pre-visit preparation completed: No  Pain : No/denies pain     Nutritional Status: BMI > 30  Obese Nutritional Risks: Nausea/ vomitting/ diarrhea (chronic diarrhea) Diabetes: Yes CBG done?: Yes CBG resulted in Enter/ Edit results?: Yes (215) Did pt. bring in CBG monitor from home?: No  How often do you need to have someone help you when you read instructions, pamphlets, or other written materials from your doctor or pharmacy?: 1 - Never  Diabetic?Nutrition Risk Assessment:  Has the patient had any N/V/D within the last 2 months?  Yes  Does the patient have any non-healing wounds?  No  Has the patient had any unintentional weight loss or weight gain?  No   Diabetes:  Is the patient diabetic?  Yes  If diabetic, was a CBG obtained today?  Yes  Did the patient bring in their glucometer from home?  No  How often do you monitor your  CBG's? daily.   Financial Strains and Diabetes Management:  Are you having any financial strains with the device, your supplies or your medication? No .  Does the patient want to be seen by Chronic Care Management for management of their diabetes?  No  Would the patient like to be referred to a Nutritionist or for Diabetic Management?  No   Diabetic Exams:  Diabetic Eye Exam: Completed 07/29/2017 Diabetic Foot Exam: Completed 01/07/2021       Information entered by :: Laurel Dimmer, CMA   Activities of Daily Living    09/11/2021    1:22 PM 08/28/2021   11:42 AM  In your present state of health, do you have any difficulty performing the following activities:  Hearing? 1 0  Vision? 1 0  Difficulty concentrating or making decisions? 1 1  Walking or climbing stairs? 1 0  Dressing or bathing? 0 0  Doing errands, shopping? 0 0    Patient Care Team: Lorre Munroe, NP as PCP - General (Internal Medicine)  Indicate any recent Medical Services you may have received from other than [facility] providers in the past year (date may be approximate).    No hospitalization in the past 12 months  Assessment:   This is a routine wellness examination for Yariah.  Hearing/Vision screen No results found.  Dietary issues and exercise activities discussed: Current Exercise Habits: The patient does not participate in regular exercise at present, Exercise limited by: None identified   Goals Addressed   None   Depression Screen    09/08/2021   10:13 AM 08/28/2021   11:41 AM 07/03/2021   11:53 AM 05/21/2021    9:40 AM 04/07/2021   11:17 AM 01/07/2021    9:51 AM 12/26/2020   10:08 AM  PHQ 2/9 Scores  PHQ - 2 Score  PHQ- 9 Score  Information is confidential and restricted. Go to Review Flowsheets  to unlock data.    Fall Risk    09/11/2021    1:22 PM 08/28/2021   11:41 AM 05/21/2021    9:40 AM 01/07/2021    9:52 AM 09/03/2020    2:44 PM  Fall Risk   Falls in the past  year? 0 0 0 1 0  Number falls in past yr: 0 0 0 1   Injury with Fall? 0 0 0 0   Risk for fall due to : No Fall Risks No Fall Risks No Fall Risks Other (Comment) Medication side effect  Follow up Falls evaluation completed Falls evaluation completed Falls evaluation completed Falls evaluation completed Falls evaluation completed;Education provided;Falls prevention discussed    FALL RISK PREVENTION PERTAINING TO THE HOME:  Any stairs in or around the home? No  If so, are there any without handrails? No  Home free of loose throw rugs in walkways, pet beds, electrical cords, etc? Yes  Adequate lighting in your home to reduce risk of falls? Yes   ASSISTIVE DEVICES UTILIZED TO PREVENT FALLS:  Life alert? No  Use of a cane, walker or w/c? No  Grab bars in the bathroom? No  Shower chair or bench in shower? No  Elevated toilet seat or a handicapped toilet? No    Cognitive Function:    09/11/2019    8:57 AM  MMSE - Mini Mental State Exam  Orientation to time 1  Orientation to Place 5  Registration 1  Attention/ Calculation 0  Recall 1  Language- name 2 objects 2  Language- repeat 1  Language- follow 3 step command 3  Language- read & follow direction 1  Write a sentence 1  Copy design 0  Total score 16        09/11/2021    1:22 PM 09/03/2020    2:49 PM 01/13/2018    2:46 PM 01/13/2017    3:14 PM  6CIT Screen  What Year? 0 points 0 points 0 points 0 points  What month? 0 points 0 points 0 points 0 points  What time? 0 points 0 points 0 points 0 points  Count back from 20 0 points 2 points 0 points 0 points  Months in reverse 0 points 4 points 0 points 0 points  Repeat phrase 0 points 10 points 0 points 4 points  Total Score 0 points 16 points 0 points 4 points    Immunizations Immunization History  Administered Date(s) Administered   Influenza,inj,Quad PF,6+ Mos 01/16/2016, 01/13/2017, 01/13/2018, 01/07/2021   Influenza-Unspecified 02/07/2015   PNEUMOCOCCAL CONJUGATE-20  05/21/2021   Pneumococcal Polysaccharide-23 04/29/2015   Tdap 01/23/2015     TDAP status: Up to date Flu Vaccine status: Up to date  Pneumococcal vaccine status: Up to date  COVID: Pt declined  Qualifies for Shingles Vaccine? No   Zostavax completed No   Shingrix Completed?: No.    Education has been provided regarding the importance of this vaccine. Patient has been advised to call insurance company to determine out of pocket expense if they have not yet received this vaccine. Advised may also receive vaccine at local pharmacy or Health Dept. Verbalized acceptance and understanding.  Screening Tests Health Maintenance  Topic Date Due   OPHTHALMOLOGY EXAM  07/30/2018   PAP SMEAR-Modifier  09/11/2021 (Originally 01/25/2020)   COVID-19 Vaccine (1) 09/13/2021 (Originally 10/09/1971)   MAMMOGRAM  05/21/2022 (Originally 04/10/1990)   COLONOSCOPY (Pts 45-335yrs Insurance coverage will need to be confirmed)  05/21/2022 (Originally 04/10/2017)   HEMOGLOBIN A1C  11/18/2021  INFLUENZA VACCINE  11/25/2021   FOOT EXAM  01/07/2022   TETANUS/TDAP  01/25/2025   Hepatitis C Screening  Completed   HPV VACCINES  Aged Out   HIV Screening  Discontinued    Health Maintenance  Health Maintenance Due  Topic Date Due   OPHTHALMOLOGY EXAM  07/30/2018    Colonoscopy: pt declined, education given Mammogram: pt declined, education given Pap Smear: pt declined, education given  Lung Cancer Screening: (Low Dose CT Chest recommended if Age 31-80 years, 30 pack-year currently smoking OR have quit w/in 15years.) does not qualify.   Lung Cancer Screening Referral: does not qualify   Additional Screening:  Hepatitis C Screening: does qualify; Completed 05/21/2021   Vision Screening: Recommended annual ophthalmology exams for early detection of glaucoma and other disorders of the eye. Is the patient up to date with their annual eye exam?  No  Who is the provider or what is the name of the office in  which the patient attends annual eye exams?  [facility]  If pt is not established with a provider, would they like to be referred to a provider to establish care? No .   Dental Screening: Recommended annual dental exams for proper oral hygiene  Community Resource Referral / Chronic Care Management: CRR required this visit?  No   CCM required this visit?  No      Plan:     I have personally reviewed and noted the following in the patient's chart:   Medical and social history Use of alcohol, tobacco or illicit drugs  Current medications and supplements including opioid prescriptions.  Functional ability and status Nutritional status Physical activity Advanced directives List of other physicians Hospitalizations, surgeries, and ER visits in previous 12 months Vitals Screenings to include cognitive, depression, and falls Referrals and appointments  In addition, I have reviewed and discussed with patient certain preventive protocols, quality metrics, and best practice recommendations. A written personalized care plan for preventive services as well as general preventive health recommendations were provided to patient.     Lonna Cobb, CMA   09/11/2021      Ms. Coolman , Thank you for taking time to come for your Medicare Wellness Visit. I appreciate your ongoing commitment to your health goals. Please review the following plan we discussed and let me know if I can assist you in the future.   These are the goals we discussed:  Goals      DIET - INCREASE WATER INTAKE     Recommend drinking at least 6-8 glasses of water a day       Patient Stated     09/03/2020, wants to weigh under 200 pounds        This is a list of the screening recommended for you and due dates:  Health Maintenance  Topic Date Due   Eye exam for diabetics  07/30/2018   Pap Smear  09/11/2021*   COVID-19 Vaccine (1) 09/13/2021*   Mammogram  05/21/2022*   Colon Cancer Screening   05/21/2022*   Hemoglobin A1C  11/18/2021   Flu Shot  11/25/2021   Complete foot exam   01/07/2022   Tetanus Vaccine  01/25/2025   Hepatitis C Screening: USPSTF Recommendation to screen - Ages 18-79 yo.  Completed   HPV Vaccine  Aged Out   HIV Screening  Discontinued  *Topic was postponed. The date shown is not the original due date.

## 2021-09-11 NOTE — Patient Instructions (Signed)

## 2021-09-12 ENCOUNTER — Telehealth: Payer: Medicare Other | Admitting: Psychiatry

## 2021-09-29 NOTE — Progress Notes (Deleted)
BH MD/PA/NP OP Progress Note  09/29/2021 1:13 PM RYA RAUSCH  MRN:  [id number]  Chief Complaint: No chief complaint on file.  HPI: *** Visit Diagnosis: No diagnosis found.  Past Psychiatric History: Please see initial evaluation for full details. I have reviewed the history. No updates at this time.     Past Medical History:  Past Medical History:  Diagnosis Date   Allergic rhinitis    Anxiety    Asthma    Bipolar disorder (HCC)    Depression    Diabetes mellitus without complication (HCC)    GERD (gastroesophageal reflux disease)    Hyperlipidemia    Hypertension    Mild sleep apnea    OCD (obsessive compulsive disorder)    Palpitations    Paranoia (HCC)    Schizoaffective disorder (HCC)    Sleep apnea     Past Surgical History:  Procedure Laterality Date   CESAREAN SECTION  2006/2008   CHOLECYSTECTOMY  2006    Family Psychiatric History: Please see initial evaluation for full details. I have reviewed the history. No updates at this time.     Family History:  Family History  Problem Relation Age of Onset   Lupus Mother    Diabetes Mother        pre diabetes   Diabetes Father    Hyperlipidemia Father    Diabetes Brother    Depression Paternal Grandmother    Bipolar disorder Paternal Grandmother     Social History:  Social History   Socioeconomic History   Marital status: Married    Spouse name: Marlyne Totaro   Number of children: 2   Years of education: 16   Highest education level: Bachelor's degree (e.g., BA, AB, BS)  Occupational History   Occupation: disability  Tobacco Use   Smoking status: Never   Smokeless tobacco: Never  Vaping Use   Vaping Use: Never used  Substance and Sexual Activity   Alcohol use: Not Currently   Drug use: Never   Sexual activity: Yes    Partners: Male    Birth control/protection: Surgical  Other Topics Concern   Not on file  Social History Narrative   Not on file   Social Determinants of Health    Financial Resource Strain: Low Risk    Difficulty of Paying Living Expenses: Not hard at all  Food Insecurity: No Food Insecurity   Worried About Programme researcher, broadcasting/film/video in the Last Year: Never true   Ran Out of Food in the Last Year: Never true  Transportation Needs: No Transportation Needs   Lack of Transportation (Medical): No   Lack of Transportation (Non-Medical): No  Physical Activity: Inactive   Days of Exercise per Week: 0 days   Minutes of Exercise per Session: 0 min  Stress: Stress Concern Present   Feeling of Stress : Rather much  Social Connections: Socially Integrated   Frequency of Communication with Friends and Family: Once a week   Frequency of Social Gatherings with Friends and Family: More than three times a week   Attends Religious Services: More than 4 times per year   Active Member of [employer] or Organizations: Yes   Attends Banker Meetings: More than 4 times per year   Marital Status: Married    Allergies:  Allergies  Allergen Reactions   Abilify [Aripiprazole] Hives    Metabolic Disorder Labs: Lab Results  Component Value Date   HGBA1C 7.6 (H) 05/21/2021   MPG 171 05/21/2021  MPG 166 01/08/2021   Lab Results  Component Value Date   PROLACTIN 84.8 (H) 06/01/2017   PROLACTIN 36.2 (H) 03/26/2016   Lab Results  Component Value Date   CHOL 140 05/21/2021   TRIG 236 (H) 05/21/2021   HDL 52 05/21/2021   CHOLHDL 2.7 05/21/2021   VLDL 51 (H) 04/23/2017   LDLCALC 59 05/21/2021   LDLCALC  01/08/2021     Comment:     . LDL cholesterol not calculated. Triglyceride levels greater than 400 mg/dL invalidate calculated LDL results. . Reference range: <100 . Desirable range <100 mg/dL for primary prevention;   <70 mg/dL for patients with CHD or diabetic patients  with > or = 2 CHD risk factors. Marland Kitchen. LDL-C is now calculated using the Martin-Hopkins  calculation, which is a validated novel method providing  better accuracy than the Friedewald  equation in the  estimation of LDL-C.  Horald PollenMartin SS et al. Lenox AhrJAMA. 1610;960(452013;310(19): 2061-2068  (http://education.[employer].com/faq/FAQ164)    Lab Results  Component Value Date   TSH 1.290 04/28/2018   TSH 2.570 06/01/2017    Therapeutic Level Labs: No results found for: LITHIUM Lab Results  Component Value Date   VALPROATE 52 10/22/2017   VALPROATE 43 (L) 06/01/2017   No components found for:  CBMZ  Current Medications: Current Outpatient Medications  Medication Sig Dispense Refill   ACCU-CHEK GUIDE test strip TEST TWICE DAILY 100 strip 2   albuterol (VENTOLIN HFA) 108 (90 Base) MCG/ACT inhaler INHALE 2 PUFFS INTO THE LUNGS EVERY 6 HOURS AS NEEDED FOR WHEEZING OR SHORTNESS OF BREATH 25.5 g 3   atorvastatin (LIPITOR) 20 MG tablet TAKE 1 TABLET(20 MG) BY MOUTH AT BEDTIME 90 tablet 0   divalproex (DEPAKOTE ER) 500 MG 24 hr tablet Take 1 tablet (500 mg total) by mouth daily. 90 tablet 0   EPINEPHrine 0.3 mg/0.3 mL IJ SOAJ injection Inject 0.3 mg into the muscle as needed for anaphylaxis. 1 each 0   ezetimibe (ZETIA) 10 MG tablet TAKE 1 TABLET(10 MG) BY MOUTH DAILY 90 tablet 1   fluticasone (FLOVENT HFA) 110 MCG/ACT inhaler Inhale 1 puff into the lungs 2 (two) times daily. 1 Inhaler 11   gabapentin (NEURONTIN) 600 MG tablet Take 1 tablet (600 mg total) by mouth 3 (three) times daily. 270 tablet 0   hydrochlorothiazide (HYDRODIURIL) 12.5 MG tablet TAKE 1 TABLET(12.5 MG) BY MOUTH DAILY 90 tablet 1   ibuprofen (ADVIL) 200 MG tablet Take 200 mg by mouth every 6 (six) hours as needed.     insulin degludec (TRESIBA FLEXTOUCH) 100 UNIT/ML FlexTouch Pen INJECT 40 UNITS UNDER THE SKIN EVERY DAY 15 mL 5   Insulin Pen Needle (BD PEN NEEDLE NANO U/F) 32G X 4 MM MISC 1 each by Does not apply route daily. 100 each 12   MELATONIN PO Take 5 mg by mouth at bedtime as needed.     metFORMIN (GLUCOPHAGE) 500 MG tablet TAKE 2 TABLETS(1000 MG) BY MOUTH TWICE DAILY WITH A MEAL 360 tablet 1   NOVOTWIST 32G X  5 MM MISC USE WITH LEVEMIR PEN BID  5   OLANZapine (ZYPREXA) 5 MG tablet Take 1 tablet (5 mg total) by mouth at bedtime. 90 tablet 0   omeprazole (PRILOSEC) 10 MG capsule TAKE 1 CAPSULE BY MOUTH DAILY 90 capsule 1   OZEMPIC, 1 MG/DOSE, 4 MG/3ML SOPN INJECT 1 MG INTO THE SKIN ONCE A WEEK 9 mL 0   paliperidone (INVEGA SUSTENNA) 234 MG/1.5ML SUSY injection Inject 234 mg into  the muscle every 28 (twenty-eight) days. 1.5 mL 11   pioglitazone (ACTOS) 45 MG tablet TAKE 1 TABLET(45 MG) BY MOUTH DAILY 90 tablet 1   sulfamethoxazole-trimethoprim (BACTRIM) 400-80 MG tablet Take 1 tablet by mouth 2 (two) times daily. 10 tablet 0   SUPER B COMPLEX/C PO Take 1 tablet by mouth daily. (Patient not taking: Reported on 09/11/2021)     Vilazodone HCl (VIIBRYD) 40 MG TABS Take 1 tablet (40 mg total) by mouth daily. 90 tablet 1   Current Facility-Administered Medications  Medication Dose Route Frequency Provider Last Rate Last Admin   paliperidone (INVEGA SUSTENNA) injection 234 mg  234 mg Intramuscular Q28 days [name], MD   234 mg at 09/08/21 [id number]     Musculoskeletal: Strength & Muscle Tone:  N/A Gait & Station:  N/A Patient leans: N/A  Psychiatric Specialty Exam: Review of Systems  There were no vitals taken for this visit.There is no height or weight on file to calculate BMI.  General Appearance: {Appearance:22683}  Eye Contact:  {BHH EYE CONTACT:22684}  Speech:  Clear and Coherent  Volume:  Normal  Mood:  {BHH MOOD:22306}  Affect:  {Affect (PAA):22687}  Thought Process:  Coherent  Orientation:  Full (Time, Place, and Person)  Thought Content: Logical   Suicidal Thoughts:  {ST/HT (PAA):22692}  Homicidal Thoughts:  {ST/HT (PAA):22692}  Memory:  Immediate;   Good  Judgement:  {Judgement (PAA):22694}  Insight:  {Insight (PAA):22695}  Psychomotor Activity:  Normal  Concentration:  Concentration: Good and Attention Span: Good  Recall:  Good  Fund of Knowledge: Good  Language: Good   Akathisia:  No  Handed:  Right  AIMS (if indicated): not done  Assets:  Communication Skills Desire for Improvement  ADL's:  Intact  Cognition: WNL  Sleep:  {BHH GOOD/FAIR/POOR:22877}   Screenings: AIMS    Flowsheet Row Office Visit from 05/12/2017 in [facility] Office Visit from 07/29/2016 in [facility] Office Visit from 05/06/2016 in [facility] Office Visit from 04/01/2016 in [facility] Office Visit from 03/28/2015 in [facility]  AIMS Total Score 0 0 0 0 0      GAD-7    Flowsheet Row Office Visit from 08/28/2021 in [facility] Office Visit from 05/21/2021 in [facility] Office Visit from 01/07/2021 in [facility] Office Visit from 04/28/2018 in [facility]  Total GAD-7 Score 4 14 7 8       Mini-Mental    Flowsheet Row Office Visit from 09/07/2019 in [facility]  Total Score (max 30 points ) 16      PHQ2-9    Flowsheet Row Office Visit from 09/08/2021 in [facility] Office Visit from 08/28/2021 in [facility] Office Visit from 07/03/2021 in [facility] Office Visit from 05/21/2021 in [facility] Visit from 04/07/2021 in [facility]  PHQ-2 Total Score 3 2 2 4 4   PHQ-9 Total Score 19 11 13 13 15       Flowsheet Row Office Visit from 09/08/2021 in [facility] Office Visit from 07/03/2021 in [facility] Office Visit from 04/07/2021 in [facility]  C-SSRS RISK CATEGORY Error: Q3, 4, or 5 should not be populated when Q2 is No No Risk Low Risk        Assessment and Plan:  ALEYSSA PIKE is a 51 y.o. year  old female with a history of schizoaffective disorder,  sleep  apnea (using CPAP machine), diabetes, hypertension, hyperlipidemia, GERD, who presents for follow up appointment for below.      1. Schizoaffective disorder, depressive type (HCC) 2. Obsessive-compulsive disorder, unspecified type 3. Anxiety state She reports significant worsening in depressive symptoms, anxiety since the last visit.  Psychosocial stressors includes upcoming court hearing for her husband.  We uptitrate olanzapine to optimize treatment for depressive symptoms.  Will continue Invega IM for schizoaffective disorder.  Noted that she has residual hallucinations with Invega only.  Benefits of being on 2 antipsychotics outweigh benefit.  Will continue Viibryd to target depression and anxiety.  Will continue gabapentin for anxiety.    This clinician has discussed the side effect associated with medication prescribed during this encounter. Please refer to notes in the previous encounters for more details.      Plan Continue monthly invega 234 mg IM - monitor weight gain. She is on metformin.  Increase olanzapine 7.5 mg at night - monitor weight gain Continue Depakote 500 mg daily Obtain labs (VPA, CBC, LFT) Continue Viibryd 40 mg daily Continue gabapentin 600 mg 3 times a day Next appointment- 6/7 at 11:30 for 30 mins, video - She will see [facility] PCP at [facility]   Past trials of medication: Abilify (hives), olanzapine, quetiapine, risperidone (stopped working),       Secretary/administrator of Care: Collaboration of Care: {BH OP Collaboration of [employer]  Patient/Guardian was advised Release of Information must be obtained prior to any record release in order to collaborate their care with an outside provider. Patient/Guardian was advised if they have not already done so to contact the registration department to sign all necessary forms in order for [place] to release information regarding their care.   Consent: Patient/Guardian gives verbal consent for treatment and assignment of  benefits for services provided during this visit. Patient/Guardian expressed understanding and agreed to proceed.    [name], MD 09/29/2021, 1:13 PM

## 2021-09-30 ENCOUNTER — Ambulatory Visit: Payer: Medicare Other | Admitting: Psychiatry

## 2021-10-01 ENCOUNTER — Ambulatory Visit: Payer: Medicare Other | Admitting: Psychiatry

## 2021-10-02 ENCOUNTER — Other Ambulatory Visit
Admission: RE | Admit: 2021-10-02 | Discharge: 2021-10-02 | Disposition: A | Discharge status: Home / Self Care | Payer: Medicare Other | Attending: Psychiatry | Admitting: Psychiatry

## 2021-10-02 ENCOUNTER — Ambulatory Visit (INDEPENDENT_AMBULATORY_CARE_PROVIDER_SITE_OTHER): Payer: Medicare Other

## 2021-10-02 DIAGNOSIS — F251 Schizoaffective disorder, depressive type: Secondary | ICD-10-CM

## 2021-10-02 DIAGNOSIS — F422 Mixed obsessional thoughts and acts: Secondary | ICD-10-CM

## 2021-10-02 DIAGNOSIS — F3170 Bipolar disorder, currently in remission, most recent episode unspecified: Secondary | ICD-10-CM | POA: Diagnosis not present

## 2021-10-02 LAB — HEPATIC FUNCTION PANEL
ALT: 33 U/L (ref 0–44)
AST: 34 U/L (ref 15–41)
Albumin: 4.1 g/dL (ref 3.5–5.0)
Alkaline Phosphatase: 114 U/L (ref 38–126)
Bilirubin, Direct: 0.1 mg/dL (ref 0.0–0.2)
Indirect Bilirubin: 0.7 mg/dL (ref 0.3–0.9)
Total Bilirubin: 0.8 mg/dL (ref 0.3–1.2)
Total Protein: 7.1 g/dL (ref 6.5–8.1)

## 2021-10-02 NOTE — Patient Instructions (Signed)
pt was given invega sustenna 234mg /1.14ml in the upper left out quatient.   pt appears clean and in a good mood.  she talked about her boys having things to do this summer .  Her and her husband having some free time because the kids have some things to do this summer. Pt talked normal. Pt states is not doing well she states that she can feel it when the mediation is wearing office.    s/n # [id number]  exp 11-24  lot # [facility]  NDC# [facility]

## 2021-10-02 NOTE — Progress Notes (Signed)
Could you contact the patient. She had another lab test for liver function test, which is in normal range. What was recommended was to get depakote level. Could you advise her to get this test at [facility] lab. If any issue with the order, please fax an order to them.

## 2021-10-05 NOTE — Progress Notes (Unsigned)
Virtual Visit via Video Note  I connected with Tabitha [name] on 10/08/21 at 11:30 AM EDT by a video enabled telemedicine application and verified that I am speaking with the correct person using two identifiers.  Location: Patient: home Provider: office Persons participated in the visit- patient, provider    I discussed the limitations of evaluation and management by telemedicine and the availability of in person appointments. The patient expressed understanding and agreed to proceed.      I discussed the assessment and treatment plan with the patient. The patient was provided an opportunity to ask questions and all were answered. The patient agreed with the plan and demonstrated an understanding of the instructions.   The patient was advised to call back or seek an in-person evaluation if the symptoms worsen or if the condition fails to improve as anticipated.  I provided 14 minutes of non-face-to-face time during this encounter.   [name], MD    [facility] MD/PA/NP OP Progress Note  10/08/2021 12:01 PM Tabitha [name]  MRN:  [id number]  Chief Complaint:  Chief Complaint  Patient presents with   Follow-up   Other   HPI:  This is a follow-up appointment for schizoaffective disorder and anxiety.  She states that she is not doing well.  She stays in the house, crying.  She cannot do dishes or do anything due to difficulty in concentration.  She does not feel like herself.  She has not been taking medication consistently due to the way she is feeling.  She agrees to ask her husband for help so that she can take medication consistently.  She states that that the court case of her husband is over.  The result was not what they wanted.  She does not want to talk about this as it makes her feel more upset.  She feels anxious and tense.  She sleeps 4 to 5 hours.  Although she is unsure of the weight gain, she denies change in her appetite.  She denies SI.  She has AH of her family  calling her name.  She denies VH.  She feels exhausted, and denies any decreased need for sleep or euphonia.   Wt Readings from Last 3 Encounters:  10/02/21 242 lb 3.2 oz (109.9 kg)  09/08/21 244 lb 3.2 oz (110.8 kg)  09/08/21 244 lb 3.2 oz (110.8 kg)    Visit Diagnosis:    ICD-10-CM   1. Schizoaffective disorder, depressive type (HCC)  F25.1     2. Obsessive-compulsive disorder, unspecified type  F42.9     3. Anxiety state  F41.1       Past Psychiatric History: Please see initial evaluation for full details. I have reviewed the history. No updates at this time.     Past Medical History:  Past Medical History:  Diagnosis Date   Allergic rhinitis    Anxiety    Asthma    Bipolar disorder (HCC)    Depression    Diabetes mellitus without complication (HCC)    GERD (gastroesophageal reflux disease)    Hyperlipidemia    Hypertension    Mild sleep apnea    OCD (obsessive compulsive disorder)    Palpitations    Paranoia (HCC)    Schizoaffective disorder (HCC)    Sleep apnea     Past Surgical History:  Procedure Laterality Date   CESAREAN SECTION  2006/2008   CHOLECYSTECTOMY  2006    Family Psychiatric History: Please see initial evaluation for full details. I  have reviewed the history. No updates at this time.     Family History:  Family History  Problem Relation Age of Onset   Lupus Mother    Diabetes Mother        pre diabetes   Diabetes Father    Hyperlipidemia Father    Diabetes Brother    Depression Paternal Grandmother    Bipolar disorder Paternal Grandmother     Social History:  Social History   Socioeconomic History   Marital status: Married    Spouse name: Tabitha [name]   Number of children: 2   Years of education: 16   Highest education level: Bachelor's degree (e.g., BA, AB, BS)  Occupational History   Occupation: disability  Tobacco Use   Smoking status: Never   Smokeless tobacco: Never  Vaping Use   Vaping Use: Never used  Substance and  Sexual Activity   Alcohol use: Not Currently   Drug use: Never   Sexual activity: Yes    Partners: Male    Birth control/protection: Surgical  Other Topics Concern   Not on file  Social History Narrative   Not on file   Social Determinants of Health   Financial Resource Strain: Low Risk  (09/11/2021)   Overall Financial Resource Strain (CARDIA)    Difficulty of Paying Living Expenses: Not hard at all  Food Insecurity: No Food Insecurity (09/11/2021)   Hunger Vital Sign    Worried About Running Out of Food in the Last Year: Never true    Ran Out of Food in the Last Year: Never true  Transportation Needs: No Transportation Needs (09/11/2021)   PRAPARE - Administrator, Civil Service (Medical): No    Lack of Transportation (Non-Medical): No  Physical Activity: Inactive (09/11/2021)   Exercise Vital Sign    Days of Exercise per Week: 0 days    Minutes of Exercise per Session: 0 min  Stress: Stress Concern Present (09/11/2021)   Tabitha [name]    Feeling of Stress : Rather much  Social Connections: Socially Integrated (09/11/2021)   Social Connection and Isolation Panel [NHANES]    Frequency of Communication with Friends and Family: Once a week    Frequency of Social Gatherings with Friends and Family: More than three times a week    Attends Religious Services: More than 4 times per year    Active Member of [employer] or Organizations: Yes    Attends Engineer, structural: More than 4 times per year    Marital Status: Married    Allergies:  Allergies  Allergen Reactions   Abilify [Aripiprazole] Hives    Metabolic Disorder Labs: Lab Results  Component Value Date   HGBA1C 7.6 (H) 05/21/2021   MPG 171 05/21/2021   MPG 166 01/08/2021   Lab Results  Component Value Date   PROLACTIN 84.8 (H) 06/01/2017   PROLACTIN 36.2 (H) 03/26/2016   Lab Results  Component Value Date   CHOL 140 05/21/2021   TRIG  236 (H) 05/21/2021   HDL 52 05/21/2021   CHOLHDL 2.7 05/21/2021   VLDL 51 (H) 04/23/2017   LDLCALC 59 05/21/2021   LDLCALC  01/08/2021     Comment:     . LDL cholesterol not calculated. Triglyceride levels greater than 400 mg/dL invalidate calculated LDL results. . Reference range: <100 . Desirable range <100 mg/dL for primary prevention;   <70 mg/dL for patients with CHD or diabetic patients  with > or =  2 CHD risk factors. Marland Kitchen. LDL-C is now calculated using the Martin-Hopkins  calculation, which is a validated novel method providing  better accuracy than the Friedewald equation in the  estimation of LDL-C.  Horald PollenMartin SS et al. Lenox AhrJAMA. 4098;119(142013;310(19): 2061-2068  (http://education.[employer].com/faq/FAQ164)    Lab Results  Component Value Date   TSH 1.290 04/28/2018   TSH 2.570 06/01/2017    Therapeutic Level Labs: No results found for: "LITHIUM" Lab Results  Component Value Date   VALPROATE 52 10/22/2017   VALPROATE 43 (L) 06/01/2017   No results found for: "CBMZ"  Current Medications: Current Outpatient Medications  Medication Sig Dispense Refill   ACCU-CHEK GUIDE test strip TEST TWICE DAILY 100 strip 2   albuterol (VENTOLIN HFA) 108 (90 Base) MCG/ACT inhaler INHALE 2 PUFFS INTO THE LUNGS EVERY 6 HOURS AS NEEDED FOR WHEEZING OR SHORTNESS OF BREATH 25.5 g 3   atorvastatin (LIPITOR) 20 MG tablet TAKE 1 TABLET(20 MG) BY MOUTH AT BEDTIME 90 tablet 0   divalproex (DEPAKOTE ER) 500 MG 24 hr tablet Take 1 tablet (500 mg total) by mouth daily. 90 tablet 0   EPINEPHrine 0.3 mg/0.3 mL IJ SOAJ injection Inject 0.3 mg into the muscle as needed for anaphylaxis. 1 each 0   ezetimibe (ZETIA) 10 MG tablet TAKE 1 TABLET(10 MG) BY MOUTH DAILY 90 tablet 1   fluticasone (FLOVENT HFA) 110 MCG/ACT inhaler Inhale 1 puff into the lungs 2 (two) times daily. 1 Inhaler 11   gabapentin (NEURONTIN) 600 MG tablet Take 1 tablet (600 mg total) by mouth 3 (three) times daily. 270 tablet 0    hydrochlorothiazide (HYDRODIURIL) 12.5 MG tablet TAKE 1 TABLET(12.5 MG) BY MOUTH DAILY 90 tablet 1   ibuprofen (ADVIL) 200 MG tablet Take 200 mg by mouth every 6 (six) hours as needed.     insulin degludec (TRESIBA FLEXTOUCH) 100 UNIT/ML FlexTouch Pen INJECT 40 UNITS UNDER THE SKIN EVERY DAY 15 mL 5   Insulin Pen Needle (BD PEN NEEDLE NANO U/F) 32G X 4 MM MISC 1 each by Does not apply route daily. 100 each 12   MELATONIN PO Take 5 mg by mouth at bedtime as needed.     metFORMIN (GLUCOPHAGE) 500 MG tablet TAKE 2 TABLETS(1000 MG) BY MOUTH TWICE DAILY WITH A MEAL 360 tablet 1   NOVOTWIST 32G X 5 MM MISC USE WITH LEVEMIR PEN BID  5   OLANZapine (ZYPREXA) 7.5 MG tablet Take 1 tablet (7.5 mg total) by mouth at bedtime. 90 tablet 0   omeprazole (PRILOSEC) 10 MG capsule TAKE 1 CAPSULE BY MOUTH DAILY 90 capsule 1   OZEMPIC, 1 MG/DOSE, 4 MG/3ML SOPN INJECT 1 MG INTO THE SKIN ONCE A WEEK 9 mL 0   paliperidone (INVEGA SUSTENNA) 234 MG/1.5ML SUSY injection Inject 234 mg into the muscle every 28 (twenty-eight) days. 1.5 mL 11   pioglitazone (ACTOS) 45 MG tablet TAKE 1 TABLET(45 MG) BY MOUTH DAILY 90 tablet 1   sulfamethoxazole-trimethoprim (BACTRIM) 400-80 MG tablet Take 1 tablet by mouth 2 (two) times daily. 10 tablet 0   Vilazodone HCl (VIIBRYD) 40 MG TABS Take 1 tablet (40 mg total) by mouth daily. 90 tablet 1   Current Facility-Administered Medications  Medication Dose Route Frequency Provider Last Rate Last Admin   paliperidone (INVEGA SUSTENNA) injection 234 mg  234 mg Intramuscular Q28 days Neysa HotterHisada, Stashia Sia, MD   234 mg at 10/02/21 1127     Musculoskeletal: Strength & Muscle Tone:  N/A Gait & Station:  N/A Patient leans:  N/A  Psychiatric Specialty Exam: Review of Systems  Psychiatric/Behavioral:  Positive for decreased concentration, dysphoric mood, hallucinations and sleep disturbance. Negative for agitation, behavioral problems, confusion, self-injury and suicidal ideas. The patient is  nervous/anxious. The patient is not hyperactive.   All other systems reviewed and are negative.   There were no vitals taken for this visit.There is no height or weight on file to calculate BMI.  General Appearance: Fairly Groomed  Eye Contact:  Good  Speech:  Clear and Coherent  Volume:  Normal  Mood:  Depressed  Affect:  Appropriate, Congruent, and Tearful  Thought Process:  Coherent  Orientation:  Full (Time, Place, and Person)  Thought Content: Logical   Suicidal Thoughts:  No  Homicidal Thoughts:  No  Memory:  Immediate;   Good  Judgement:  Good  Insight:  Fair  Psychomotor Activity:  Normal  Concentration:  Concentration: Good and Attention Span: Good  Recall:  Good  Fund of Knowledge: Good  Language: Good  Akathisia:  No  Handed:  Right  AIMS (if indicated): not done  Assets:  Communication Skills Desire for Improvement  ADL's:  Intact  Cognition: WNL  Sleep:  Poor   Screenings: AIMS    Flowsheet Row Office Visit from 05/12/2017 in [facility] Office Visit from 07/29/2016 in [facility] Office Visit from 05/06/2016 in [facility] Office Visit from 04/01/2016 in [facility] Office Visit from 03/28/2015 in [facility]  AIMS Total Score 0 0 0 0 0      GAD-7    Flowsheet Row Office Visit from 08/28/2021 in [facility] Office Visit from 05/21/2021 in [facility] Office Visit from 01/07/2021 in [facility] Office Visit from 04/28/2018 in [facility]  Total GAD-7 Score 4 14 7 8       Mini-Mental    Flowsheet Row Office Visit from 09/07/2019 in [facility]  Total Score (max 30 points ) 16      PHQ2-9    Flowsheet Row Office Visit from 09/08/2021 in [facility] Office Visit from 08/28/2021 in [facility] Office Visit  from 07/03/2021 in [facility] Office Visit from 05/21/2021 in [facility] Office Visit from 04/07/2021 in [facility]  PHQ-2 Total Score 3 2 2 4 4   PHQ-9 Total Score 19 11 13 13 15       Flowsheet Row Office Visit from 09/08/2021 in [facility] Office Visit from 07/03/2021 in [facility] Office Visit from 04/07/2021 in [facility]  C-SSRS RISK CATEGORY Error: Q3, 4, or 5 should not be populated when Q2 is No No Risk Low Risk        Assessment and Plan:  Tabitha [name] is a 51 y.o. year old female with a history of schizoaffective disorder,  sleep apnea (using CPAP machine), diabetes, hypertension, hyperlipidemia, GERD, who presents for follow up appointment for below.   1. Schizoaffective disorder, depressive type (HCC) 2. Obsessive-compulsive disorder, unspecified type 3. Anxiety state Exam is notable for tearfulness, and she reports worsening in depressive symptoms and anxiety in the context of non adherence to medication.  Other psychosocial stressors includes the court issues, although she declined to elaborate this.  Olanzapine was recently uptitrated; will continue current dose along with Invega IM for schizoaffective disorder given she has residual hallucinations with Invega only.  Will  continue Depakote for mood dysregulation.  Noted that although she reports good benefit from this medication in the past, she may benefit from other mood stabilizer in the future given her symptoms of depression.  Will continue Viibryd to target depression and anxiety.  Will continue gabapentin for anxiety.   This clinician has discussed the side effect associated with medication prescribed during this encounter. Please refer to notes in the previous encounters for more details.     Plan Continue monthly invega 234 mg IM - monitor weight gain. She  is on metformin.  Continue olanzapine 7.5 mg at night - monitor weight gain Continue Depakote 500 mg daily Obtain labs ([facility]) 5 days after taking depakote consistently  Continue Viibryd 40 mg daily Continue gabapentin 600 mg 3 times a day Next appointment- 6/29 at 9:30 for 30 mins, in person - She will see her PCP at [facility]   Past trials of medication: Abilify (hives), olanzapine, quetiapine, risperidone (stopped working),         Secretary/administrator of Care: Collaboration of Care: Other N/A  Patient/Guardian was advised Release of Information must be obtained prior to any record release in order to collaborate their care with an outside provider. Patient/Guardian was advised if they have not already done so to contact the registration department to sign all necessary forms in order for [place] to release information regarding their care.   Consent: Patient/Guardian gives verbal consent for treatment and assignment of benefits for services provided during this visit. Patient/Guardian expressed understanding and agreed to proceed.    [name], MD 10/08/2021, 12:01 PM

## 2021-10-08 ENCOUNTER — Encounter: Payer: Self-pay | Admitting: Psychiatry

## 2021-10-08 ENCOUNTER — Telehealth (INDEPENDENT_AMBULATORY_CARE_PROVIDER_SITE_OTHER): Payer: Medicare Other | Admitting: Psychiatry

## 2021-10-08 DIAGNOSIS — F411 Generalized anxiety disorder: Secondary | ICD-10-CM | POA: Diagnosis not present

## 2021-10-08 DIAGNOSIS — F429 Obsessive-compulsive disorder, unspecified: Secondary | ICD-10-CM | POA: Diagnosis not present

## 2021-10-08 DIAGNOSIS — F251 Schizoaffective disorder, depressive type: Secondary | ICD-10-CM | POA: Diagnosis not present

## 2021-10-08 MED ORDER — OLANZAPINE 7.5 MG PO TABS: 7.5000 mg | ORAL_TABLET | Freq: Every day | ORAL | 0 refills | Status: DC

## 2021-10-08 NOTE — Patient Instructions (Signed)
Continue monthly invega 234 mg IM  Continue olanzapine 7.5 mg at night Continue Depakote 500 mg daily Obtain labs (VPA) 5 days after taking depakote consistently  Continue Viibryd 40 mg daily Continue gabapentin 600 mg 3 times a day Next appointment- 6/29 at 9:30

## 2021-10-11 NOTE — Progress Notes (Signed)
The order is in the system already. Please print it out if needed. Thanks.

## 2021-10-16 NOTE — Progress Notes (Deleted)
BH MD/PA/NP OP Progress Note  10/16/2021 5:17 PM Tabitha [name]  MRN:  [id number]  Chief Complaint: No chief complaint on file.  HPI: *** Visit Diagnosis: No diagnosis found.  Past Psychiatric History: Please see initial evaluation for full details. I have reviewed the history. No updates at this time.     Past Medical History:  Past Medical History:  Diagnosis Date   Allergic rhinitis    Anxiety    Asthma    Bipolar disorder (HCC)    Depression    Diabetes mellitus without complication (HCC)    GERD (gastroesophageal reflux disease)    Hyperlipidemia    Hypertension    Mild sleep apnea    OCD (obsessive compulsive disorder)    Palpitations    Paranoia (HCC)    Schizoaffective disorder (HCC)    Sleep apnea     Past Surgical History:  Procedure Laterality Date   CESAREAN SECTION  2006/2008   CHOLECYSTECTOMY  2006    Family Psychiatric History: Please see initial evaluation for full details. I have reviewed the history. No updates at this time.     Family History:  Family History  Problem Relation Age of Onset   Lupus Mother    Diabetes Mother        pre diabetes   Diabetes Father    Hyperlipidemia Father    Diabetes Brother    Depression Paternal Grandmother    Bipolar disorder Paternal Grandmother     Social History:  Social History   Socioeconomic History   Marital status: Married    Spouse name: Lelaina Oatis   Number of children: 2   Years of education: 16   Highest education level: Bachelor's degree (e.g., BA, AB, BS)  Occupational History   Occupation: disability  Tobacco Use   Smoking status: Never   Smokeless tobacco: Never  Vaping Use   Vaping Use: Never used  Substance and Sexual Activity   Alcohol use: Not Currently   Drug use: Never   Sexual activity: Yes    Partners: Male    Birth control/protection: Surgical  Other Topics Concern   Not on file  Social History Narrative   Not on file   Social Determinants of Health    Financial Resource Strain: Low Risk  (09/11/2021)   Overall Financial Resource Strain (CARDIA)    Difficulty of Paying Living Expenses: Not hard at all  Food Insecurity: No Food Insecurity (09/11/2021)   Hunger Vital Sign    Worried About Running Out of Food in the Last Year: Never true    Ran Out of Food in the Last Year: Never true  Transportation Needs: No Transportation Needs (09/11/2021)   PRAPARE - Administrator, Civil Service (Medical): No    Lack of Transportation (Non-Medical): No  Physical Activity: Inactive (09/11/2021)   Exercise Vital Sign    Days of Exercise per Week: 0 days    Minutes of Exercise per Session: 0 min  Stress: Stress Concern Present (09/11/2021)   [employer] of Occupational Health - Occupational Stress Questionnaire    Feeling of Stress : Rather much  Social Connections: Socially Integrated (09/11/2021)   Social Connection and Isolation Panel [NHANES]    Frequency of Communication with Friends and Family: Once a week    Frequency of Social Gatherings with Friends and Family: More than three times a week    Attends Religious Services: More than 4 times per year    Active Member of Clubs or  Organizations: Yes    Attends Engineer, structural: More than 4 times per year    Marital Status: Married    Allergies:  Allergies  Allergen Reactions   Abilify [Aripiprazole] Hives    Metabolic Disorder Labs: Lab Results  Component Value Date   HGBA1C 7.6 (H) 05/21/2021   MPG 171 05/21/2021   MPG 166 01/08/2021   Lab Results  Component Value Date   PROLACTIN 84.8 (H) 06/01/2017   PROLACTIN 36.2 (H) 03/26/2016   Lab Results  Component Value Date   CHOL 140 05/21/2021   TRIG 236 (H) 05/21/2021   HDL 52 05/21/2021   CHOLHDL 2.7 05/21/2021   VLDL 51 (H) 04/23/2017   LDLCALC 59 05/21/2021   LDLCALC  01/08/2021     Comment:     . LDL cholesterol not calculated. Triglyceride levels greater than 400 mg/dL invalidate calculated  LDL results. . Reference range: <100 . Desirable range <100 mg/dL for primary prevention;   <70 mg/dL for patients with CHD or diabetic patients  with > or = 2 CHD risk factors. Marland Kitchen LDL-C is now calculated using the Martin-Hopkins  calculation, which is a validated novel method providing  better accuracy than the Friedewald equation in the  estimation of LDL-C.  Horald Pollen et al. Lenox Ahr. 4098;119(14): 2061-2068  (http://education.[employer].com/faq/FAQ164)    Lab Results  Component Value Date   TSH 1.290 04/28/2018   TSH 2.570 06/01/2017    Therapeutic Level Labs: No results found for: "LITHIUM" Lab Results  Component Value Date   VALPROATE 52 10/22/2017   VALPROATE 43 (L) 06/01/2017   No results found for: "CBMZ"  Current Medications: Current Outpatient Medications  Medication Sig Dispense Refill   ACCU-CHEK GUIDE test strip TEST TWICE DAILY 100 strip 2   albuterol (VENTOLIN HFA) 108 (90 Base) MCG/ACT inhaler INHALE 2 PUFFS INTO THE LUNGS EVERY 6 HOURS AS NEEDED FOR WHEEZING OR SHORTNESS OF BREATH 25.5 g 3   atorvastatin (LIPITOR) 20 MG tablet TAKE 1 TABLET(20 MG) BY MOUTH AT BEDTIME 90 tablet 0   divalproex (DEPAKOTE ER) 500 MG 24 hr tablet Take 1 tablet (500 mg total) by mouth daily. 90 tablet 0   EPINEPHrine 0.3 mg/0.3 mL IJ SOAJ injection Inject 0.3 mg into the muscle as needed for anaphylaxis. 1 each 0   ezetimibe (ZETIA) 10 MG tablet TAKE 1 TABLET(10 MG) BY MOUTH DAILY 90 tablet 1   fluticasone (FLOVENT HFA) 110 MCG/ACT inhaler Inhale 1 puff into the lungs 2 (two) times daily. 1 Inhaler 11   gabapentin (NEURONTIN) 600 MG tablet Take 1 tablet (600 mg total) by mouth 3 (three) times daily. 270 tablet 0   hydrochlorothiazide (HYDRODIURIL) 12.5 MG tablet TAKE 1 TABLET(12.5 MG) BY MOUTH DAILY 90 tablet 1   ibuprofen (ADVIL) 200 MG tablet Take 200 mg by mouth every 6 (six) hours as needed.     insulin degludec (TRESIBA FLEXTOUCH) 100 UNIT/ML FlexTouch Pen INJECT 40 UNITS  UNDER THE SKIN EVERY DAY 15 mL 5   Insulin Pen Needle (BD PEN NEEDLE NANO U/F) 32G X 4 MM MISC 1 each by Does not apply route daily. 100 each 12   MELATONIN PO Take 5 mg by mouth at bedtime as needed.     metFORMIN (GLUCOPHAGE) 500 MG tablet TAKE 2 TABLETS(1000 MG) BY MOUTH TWICE DAILY WITH A MEAL 360 tablet 1   NOVOTWIST 32G X 5 MM MISC USE WITH LEVEMIR PEN BID  5   OLANZapine (ZYPREXA) 7.5 MG tablet Take 1  tablet (7.5 mg total) by mouth at bedtime. 90 tablet 0   omeprazole (PRILOSEC) 10 MG capsule TAKE 1 CAPSULE BY MOUTH DAILY 90 capsule 1   OZEMPIC, 1 MG/DOSE, 4 MG/3ML SOPN INJECT 1 MG INTO THE SKIN ONCE A WEEK 9 mL 0   paliperidone (INVEGA SUSTENNA) 234 MG/1.5ML SUSY injection Inject 234 mg into the muscle every 28 (twenty-eight) days. 1.5 mL 11   pioglitazone (ACTOS) 45 MG tablet TAKE 1 TABLET(45 MG) BY MOUTH DAILY 90 tablet 1   sulfamethoxazole-trimethoprim (BACTRIM) 400-80 MG tablet Take 1 tablet by mouth 2 (two) times daily. 10 tablet 0   Vilazodone HCl (VIIBRYD) 40 MG TABS Take 1 tablet (40 mg total) by mouth daily. 90 tablet 1   Current Facility-Administered Medications  Medication Dose Route Frequency Provider Last Rate Last Admin   paliperidone (INVEGA SUSTENNA) injection 234 mg  234 mg Intramuscular Q28 days [name], MD   234 mg at 10/02/21 1127     Musculoskeletal: Strength & Muscle Tone:  Normal Gait & Station: normal Patient leans: N/A  Psychiatric Specialty Exam: Review of Systems  There were no vitals taken for this visit.There is no height or weight on file to calculate BMI.  General Appearance: {Appearance:22683}  Eye Contact:  {BHH EYE CONTACT:22684}  Speech:  Clear and Coherent  Volume:  Normal  Mood:  {BHH MOOD:22306}  Affect:  {Affect (PAA):22687}  Thought Process:  Coherent  Orientation:  Full (Time, Place, and Person)  Thought Content: Logical   Suicidal Thoughts:  {ST/HT (PAA):22692}  Homicidal Thoughts:  {ST/HT (PAA):22692}  Memory:  Immediate;    Good  Judgement:  {Judgement (PAA):22694}  Insight:  {Insight (PAA):22695}  Psychomotor Activity:  Normal  Concentration:  Concentration: Good and Attention Span: Good  Recall:  Good  Fund of Knowledge: Good  Language: Good  Akathisia:  No  Handed:  Right  AIMS (if indicated): not done  Assets:  Communication Skills Desire for Improvement  ADL's:  Intact  Cognition: WNL  Sleep:  {BHH GOOD/FAIR/POOR:22877}   Screenings: AIMS    Flowsheet Row Office Visit from 05/12/2017 in [facility] Office Visit from 07/29/2016 in [facility] Office Visit from 05/06/2016 in [facility] Office Visit from 04/01/2016 in [facility] Office Visit from 03/28/2015 in [facility]  AIMS Total Score 0 0 0 0 0      GAD-7    Flowsheet Row Office Visit from 08/28/2021 in [facility] Office Visit from 05/21/2021 in [facility] Office Visit from 01/07/2021 in [facility] Office Visit from 04/28/2018 in [facility]  Total GAD-7 Score 4 14 7 8       Mini-Mental    Flowsheet Row Office Visit from 09/07/2019 in [facility]  Total Score (max 30 points ) 16      PHQ2-9    Flowsheet Row Office Visit from 09/08/2021 in [facility] Office Visit from 08/28/2021 in [facility] Office Visit from 07/03/2021 in [facility] Office Visit from 05/21/2021 in [facility] Office Visit from 04/07/2021 in [facility]  PHQ-2 Total Score 3 2 2 4 4   PHQ-9 Total Score 19 11 13 13 15       Flowsheet Row Office Visit from 09/08/2021 in [facility] Office Visit from 07/03/2021 in [facility] Office Visit from 04/07/2021 in [facility]  C-SSRS RISK CATEGORY Error: Q3, 4, or 5 should not be populated when Q2 is No No Risk Low Risk        Assessment and Plan:  TYLENA PRISK is a 51 y.o. year old female with a history of schizoaffective disorder,  sleep apnea (using CPAP machine), diabetes, hypertension, hyperlipidemia, GERD, who presents for follow up appointment for below.     1. Schizoaffective disorder, depressive type (HCC) 2. Obsessive-compulsive disorder, unspecified type 3. Anxiety state Exam is notable for tearfulness, and she reports worsening in depressive symptoms and anxiety in the context of non adherence to medication.  Other psychosocial stressors includes the court issues, although she declined to elaborate this.  Olanzapine was recently uptitrated; will continue current dose along with Invega IM for schizoaffective disorder given she has residual hallucinations with Invega only.  Will continue Depakote for mood dysregulation.  Noted that although she reports good benefit from this medication in the past, she may benefit from other mood stabilizer in the future given her symptoms of depression.  Will continue Viibryd to target depression and anxiety.  Will continue gabapentin for anxiety.    This clinician has discussed the side effect associated with medication prescribed during this encounter. Please refer to notes in the previous encounters for more details.     Plan Continue monthly invega 234 mg IM - monitor weight gain. She is on metformin.  Continue olanzapine 7.5 mg at night - monitor weight gain Continue Depakote 500 mg daily Obtain labs (VPA) 5 days after taking depakote consistently  Continue Viibryd 40 mg daily Continue gabapentin 600 mg 3 times a day Next appointment- 6/29 at 9:30 for 30 mins, in person - She will [facility] PCP at [facility]   Past trials of medication: Abilify (hives), olanzapine, quetiapine, risperidone (stopped working),           Secretary/administrator of Care:  Collaboration of Care: {BH OP Collaboration of [employer]  Patient/Guardian was advised Release of Information must be obtained prior to any record release in order to collaborate their care with an outside provider. Patient/Guardian was advised if they have not already done so to contact the registration department to sign all necessary forms in order for [place] to release information regarding their care.   Consent: Patient/Guardian gives verbal consent for treatment and assignment of benefits for services provided during this visit. Patient/Guardian expressed understanding and agreed to proceed.    [name], MD 10/16/2021, 5:17 PM

## 2021-10-20 ENCOUNTER — Other Ambulatory Visit: Payer: Self-pay | Admitting: Internal Medicine

## 2021-10-20 DIAGNOSIS — E7800 Pure hypercholesterolemia, unspecified: Secondary | ICD-10-CM

## 2021-10-22 NOTE — Progress Notes (Deleted)
BH MD/PA/NP OP Progress Note  10/22/2021 12:53 PM Tabitha [name]  MRN:  [id number]  Chief Complaint: No chief complaint on file.  HPI: *** Visit Diagnosis: No diagnosis found.  Past Psychiatric History: Please see initial evaluation for full details. I have reviewed the history. No updates at this time.     Past Medical History:  Past Medical History:  Diagnosis Date   Allergic rhinitis    Anxiety    Asthma    Bipolar disorder (HCC)    Depression    Diabetes mellitus without complication (HCC)    GERD (gastroesophageal reflux disease)    Hyperlipidemia    Hypertension    Mild sleep apnea    OCD (obsessive compulsive disorder)    Palpitations    Paranoia (HCC)    Schizoaffective disorder (HCC)    Sleep apnea     Past Surgical History:  Procedure Laterality Date   CESAREAN SECTION  2006/2008   CHOLECYSTECTOMY  2006    Family Psychiatric History: Please see initial evaluation for full details. I have reviewed the history. No updates at this time.     Family History:  Family History  Problem Relation Age of Onset   Lupus Mother    Diabetes Mother        pre diabetes   Diabetes Father    Hyperlipidemia Father    Diabetes Brother    Depression Paternal Grandmother    Bipolar disorder Paternal Grandmother     Social History:  Social History   Socioeconomic History   Marital status: Married    Spouse name: Liliahna Cudd   Number of children: 2   Years of education: 16   Highest education level: Bachelor's degree (e.g., BA, AB, BS)  Occupational History   Occupation: disability  Tobacco Use   Smoking status: Never   Smokeless tobacco: Never  Vaping Use   Vaping Use: Never used  Substance and Sexual Activity   Alcohol use: Not Currently   Drug use: Never   Sexual activity: Yes    Partners: Male    Birth control/protection: Surgical  Other Topics Concern   Not on file  Social History Narrative   Not on file   Social Determinants of Health    Financial Resource Strain: Low Risk  (09/11/2021)   Overall Financial Resource Strain (CARDIA)    Difficulty of Paying Living Expenses: Not hard at all  Food Insecurity: No Food Insecurity (09/11/2021)   Hunger Vital Sign    Worried About Running Out of Food in the Last Year: Never true    Ran Out of Food in the Last Year: Never true  Transportation Needs: No Transportation Needs (09/11/2021)   PRAPARE - Administrator, Civil Service (Medical): No    Lack of Transportation (Non-Medical): No  Physical Activity: Inactive (09/11/2021)   Exercise Vital Sign    Days of Exercise per Week: 0 days    Minutes of Exercise per Session: 0 min  Stress: Stress Concern Present (09/11/2021)   [employer] of Occupational Health - Occupational Stress Questionnaire    Feeling of Stress : Rather much  Social Connections: Socially Integrated (09/11/2021)   Social Connection and Isolation Panel [NHANES]    Frequency of Communication with Friends and Family: Once a week    Frequency of Social Gatherings with Friends and Family: More than three times a week    Attends Religious Services: More than 4 times per year    Active Member of Clubs or  Organizations: Yes    Attends Engineer, structural: More than 4 times per year    Marital Status: Married    Allergies:  Allergies  Allergen Reactions   Abilify [Aripiprazole] Hives    Metabolic Disorder Labs: Lab Results  Component Value Date   HGBA1C 7.6 (H) 05/21/2021   MPG 171 05/21/2021   MPG 166 01/08/2021   Lab Results  Component Value Date   PROLACTIN 84.8 (H) 06/01/2017   PROLACTIN 36.2 (H) 03/26/2016   Lab Results  Component Value Date   CHOL 140 05/21/2021   TRIG 236 (H) 05/21/2021   HDL 52 05/21/2021   CHOLHDL 2.7 05/21/2021   VLDL 51 (H) 04/23/2017   LDLCALC 59 05/21/2021   LDLCALC  01/08/2021     Comment:     . LDL cholesterol not calculated. Triglyceride levels greater than 400 mg/dL invalidate calculated  LDL results. . Reference range: <100 . Desirable range <100 mg/dL for primary prevention;   <70 mg/dL for patients with CHD or diabetic patients  with > or = 2 CHD risk factors. Marland Kitchen LDL-C is now calculated using the Martin-Hopkins  calculation, which is a validated novel method providing  better accuracy than the Friedewald equation in the  estimation of LDL-C.  Horald Pollen et al. Lenox Ahr. 3220;254(27): 2061-2068  (http://education.[employer].com/faq/FAQ164)    Lab Results  Component Value Date   TSH 1.290 04/28/2018   TSH 2.570 06/01/2017    Therapeutic Level Labs: No results found for: "LITHIUM" Lab Results  Component Value Date   VALPROATE 52 10/22/2017   VALPROATE 43 (L) 06/01/2017   No results found for: "CBMZ"  Current Medications: Current Outpatient Medications  Medication Sig Dispense Refill   ACCU-CHEK GUIDE test strip TEST TWICE DAILY 100 strip 2   albuterol (VENTOLIN HFA) 108 (90 Base) MCG/ACT inhaler INHALE 2 PUFFS INTO THE LUNGS EVERY 6 HOURS AS NEEDED FOR WHEEZING OR SHORTNESS OF BREATH 25.5 g 3   atorvastatin (LIPITOR) 20 MG tablet TAKE 1 TABLET(20 MG) BY MOUTH AT BEDTIME 90 tablet 2   divalproex (DEPAKOTE ER) 500 MG 24 hr tablet Take 1 tablet (500 mg total) by mouth daily. 90 tablet 0   EPINEPHrine 0.3 mg/0.3 mL IJ SOAJ injection Inject 0.3 mg into the muscle as needed for anaphylaxis. 1 each 0   ezetimibe (ZETIA) 10 MG tablet TAKE 1 TABLET(10 MG) BY MOUTH DAILY 90 tablet 1   fluticasone (FLOVENT HFA) 110 MCG/ACT inhaler Inhale 1 puff into the lungs 2 (two) times daily. 1 Inhaler 11   gabapentin (NEURONTIN) 600 MG tablet Take 1 tablet (600 mg total) by mouth 3 (three) times daily. 270 tablet 0   hydrochlorothiazide (HYDRODIURIL) 12.5 MG tablet TAKE 1 TABLET(12.5 MG) BY MOUTH DAILY 90 tablet 1   ibuprofen (ADVIL) 200 MG tablet Take 200 mg by mouth every 6 (six) hours as needed.     insulin degludec (TRESIBA FLEXTOUCH) 100 UNIT/ML FlexTouch Pen INJECT 40 UNITS  UNDER THE SKIN EVERY DAY 15 mL 5   Insulin Pen Needle (BD PEN NEEDLE NANO U/F) 32G X 4 MM MISC 1 each by Does not apply route daily. 100 each 12   MELATONIN PO Take 5 mg by mouth at bedtime as needed.     metFORMIN (GLUCOPHAGE) 500 MG tablet TAKE 2 TABLETS(1000 MG) BY MOUTH TWICE DAILY WITH A MEAL 360 tablet 1   NOVOTWIST 32G X 5 MM MISC USE WITH LEVEMIR PEN BID  5   OLANZapine (ZYPREXA) 7.5 MG tablet Take 1  tablet (7.5 mg total) by mouth at bedtime. 90 tablet 0   omeprazole (PRILOSEC) 10 MG capsule TAKE 1 CAPSULE BY MOUTH DAILY 90 capsule 1   OZEMPIC, 1 MG/DOSE, 4 MG/3ML SOPN INJECT 1 MG INTO THE SKIN ONCE A WEEK 9 mL 0   paliperidone (INVEGA SUSTENNA) 234 MG/1.5ML SUSY injection Inject 234 mg into the muscle every 28 (twenty-eight) days. 1.5 mL 11   pioglitazone (ACTOS) 45 MG tablet TAKE 1 TABLET(45 MG) BY MOUTH DAILY 90 tablet 1   sulfamethoxazole-trimethoprim (BACTRIM) 400-80 MG tablet Take 1 tablet by mouth 2 (two) times daily. 10 tablet 0   Vilazodone HCl (VIIBRYD) 40 MG TABS Take 1 tablet (40 mg total) by mouth daily. 90 tablet 1   Current Facility-Administered Medications  Medication Dose Route Frequency Provider Last Rate Last Admin   paliperidone (INVEGA SUSTENNA) injection 234 mg  234 mg Intramuscular Q28 days [name], MD   234 mg at 10/02/21 1127     Musculoskeletal: Strength & Muscle Tone:  N/A Gait & Station:  N/A Patient leans: N/A  Psychiatric Specialty Exam: Review of Systems  There were no vitals taken for this visit.There is no height or weight on file to calculate BMI.  General Appearance: {Appearance:22683}  Eye Contact:  {B[facility] EYE CONTACT:22684}  Speech:  Clear and Coherent  Volume:  Normal  Mood:  {BHH MOOD:22306}  Affect:  {Affect (PAA):22687}  Thought Process:  Coherent  Orientation:  Full (Time, Place, and Person)  Thought Content: Logical   Suicidal Thoughts:  {ST/HT (PAA):22692}  Homicidal Thoughts:  {ST/HT (PAA):22692}  Memory:  Immediate;    Good  Judgement:  {Judgement (PAA):22694}  Insight:  {Insight (PAA):22695}  Psychomotor Activity:  Normal  Concentration:  Concentration: Good and Attention Span: Good  Recall:  Good  Fund of Knowledge: Good  Language: Good  Akathisia:  No  Handed:  Right  AIMS (if indicated): not done  Assets:  Communication Skills Desire for Improvement  ADL's:  Intact  Cognition: WNL  Sleep:  {BHH GOOD/FAIR/POOR:22877}   Screenings: AIMS    Flowsheet Row Office Visit from 05/12/2017 in [facility] Office Visit from 07/29/2016 in [facility] Office Visit from 05/06/2016 in [facility] Office Visit from 04/01/2016 in [facility] Office Visit from 03/28/2015 in [facility]  AIMS Total Score 0 0 0 0 0      GAD-7    Flowsheet Row Office Visit from 08/28/2021 in [facility] Office Visit from 05/21/2021 in [facility] Office Visit from 01/07/2021 in [facility] Office Visit from 04/28/2018 in [facility]  Total GAD-7 Score 4 14 7 8       Mini-Mental    Flowsheet Row Office Visit from 09/07/2019 in [facility]  Total Score (max 30 points ) 16      PHQ2-9    Flowsheet Row Office Visit from 09/08/2021 in [facility] Office Visit from 08/28/2021 in [facility] Office Visit from 07/03/2021 in [facility] Office Visit from 05/21/2021 in [facility] Office Visit from 04/07/2021 in [facility]  PHQ-2 Total Score 3 2 2 4 4   PHQ-9 Total Score 19 11 13 13 15       Flowsheet Row Office Visit from 09/08/2021 in [facility] Office Visit from 07/03/2021 in [facility] Office Visit from 04/07/2021 in [facility]  C-SSRS RISK CATEGORY Error: Q3, 4, or 5 should not be populated when Q2 is No No Risk Low Risk        Assessment and Plan:  SANIYAH MONDESIR is a 51 y.o. year old female with a history of  schizoaffective disorder,  sleep apnea (using CPAP machine), diabetes, hypertension, hyperlipidemia, GERD, who presents for follow up appointment for below.    1. Schizoaffective disorder, depressive type (HCC) 2. Obsessive-compulsive disorder, unspecified type 3. Anxiety state Exam is notable for tearfulness, and she reports worsening in depressive symptoms and anxiety in the context of non adherence to medication.  Other psychosocial stressors includes the court issues, although she declined to elaborate this.  Olanzapine was recently uptitrated; will continue current dose along with Invega IM for schizoaffective disorder given she has residual hallucinations with Invega only.  Will continue Depakote for mood dysregulation.  Noted that although she reports good benefit from this medication in the past, she may benefit from other mood stabilizer in the future given her symptoms of depression.  Will continue Viibryd to target depression and anxiety.  Will continue gabapentin for anxiety.    This clinician has discussed the side effect associated with medication prescribed during this encounter. Please refer to notes in the previous encounters for more details.     Plan Continue monthly invega 234 mg IM - monitor weight gain. She is on metformin.  Continue olanzapine 7.5 mg at night - monitor weight gain Continue Depakote 500 mg daily Obtain labs ([facility]) 5 days after taking depakote consistently  Continue Viibryd 40 mg daily Continue gabapentin 600 mg 3 times a day Next appointment- 6/29 at 9:30 for 30 mins, in person - She will see her PCP at [facility]   Past trials of medication: Abilify (hives), olanzapine, quetiapine, risperidone (stopped working),               Secretary/administrator of  Care: Collaboration of Care: {BH OP Collaboration of [employer]  Patient/Guardian was advised Release of Information must be obtained prior to any record release in order to collaborate their care with an outside provider. Patient/Guardian was advised if they have not already done so to contact the registration department to sign all necessary forms in order for [place] to release information regarding their care.   Consent: Patient/Guardian gives verbal consent for treatment and assignment of benefits for services provided during this visit. Patient/Guardian expressed understanding and agreed to proceed.    [name], MD 10/22/2021, 12:53 PM

## 2021-10-23 ENCOUNTER — Ambulatory Visit: Payer: Medicare Other | Admitting: Psychiatry

## 2021-10-27 ENCOUNTER — Encounter: Payer: Self-pay | Admitting: Psychiatry

## 2021-10-27 ENCOUNTER — Telehealth (INDEPENDENT_AMBULATORY_CARE_PROVIDER_SITE_OTHER): Payer: Medicare Other | Admitting: Psychiatry

## 2021-10-27 DIAGNOSIS — F411 Generalized anxiety disorder: Secondary | ICD-10-CM

## 2021-10-27 DIAGNOSIS — F429 Obsessive-compulsive disorder, unspecified: Secondary | ICD-10-CM | POA: Diagnosis not present

## 2021-10-27 DIAGNOSIS — F251 Schizoaffective disorder, depressive type: Secondary | ICD-10-CM | POA: Diagnosis not present

## 2021-10-27 DIAGNOSIS — R5383 Other fatigue: Secondary | ICD-10-CM

## 2021-10-27 NOTE — Progress Notes (Signed)
Virtual Visit via Video Note  I connected with Tabitha [name] on 10/27/21 at 10:30 AM EDT by a video enabled telemedicine application and verified that I am speaking with the correct person using two identifiers.  Location: Patient: home Provider: office Persons participated in the visit- patient, provider    I discussed the limitations of evaluation and management by telemedicine and the availability of in person appointments. The patient expressed understanding and agreed to proceed.    I discussed the assessment and treatment plan with the patient. The patient was provided an opportunity to ask questions and all were answered. The patient agreed with the plan and demonstrated an understanding of the instructions.   The patient was advised to call back or seek an in-person evaluation if the symptoms worsen or if the condition fails to improve as anticipated.  I provided 17 minutes of non-face-to-face time during this encounter.   [name], MD     [facility] MD/PA/NP OP Progress Note  10/27/2021 11:01 AM Tabitha [name]  MRN:  [id number]  Chief Complaint:  Chief Complaint  Patient presents with   Follow-up   Other   HPI:  This is a follow-up appointment for schizoaffective disorder.  She states that she has been doing better.  She is now able to be back on higher dose of olanzapine; she was taking lower dose for the past few weeks.  She states that she was paranoid when she brought her son to the hospital.  She thought that people were staring at her and then after her.  Her son is doing better.  He had GERD, and had endoscopy.  She thinks her mood has been better now that her children are at home.  She tries to work on house chores.  She goes to church, and is able to join choir again.  She has less anxiety, and denies panic attacks.  She sleeps 8 hours and feels tired.  She has not been able to see her sleep specialist, and is unsure as to who to follow-up.  She gained weight.   She is not on Ozempic anymore due to insurance issues.  She agrees to contact her PCP for available options for weight gain.  She denies SI.  She has AH of her family member calling her.  She denies VH.  She denies decreased need for sleep or euphonia.  She feels comfortable to stay on the current medication regimen at this time.   History of mania-she reports burst of energy, she lasted up to a few days in the past.  She had a decreased need for sleep up to 2 days in the past.  She denies any of these symptoms lately.   Wt Readings from Last 3 Encounters:  10/02/21 242 lb 3.2 oz (109.9 kg)  09/08/21 244 lb 3.2 oz (110.8 kg)  09/08/21 244 lb 3.2 oz (110.8 kg)      Daily routine: house chores, goes to church weekly. Choir on Wed,  Exercise: Employment: unemployed, on disability due to paranoia (could not keep her job due to paranoia that people coming to her workplace). used to work at [employer] until 2007 (quit due to pregnancy) Support: husband, son, parents Household: husband, 2 sons (age 34,15) Marital status: married Number of children: 2 sons Education: college, majored in Psychologist, educational, music  Visit Diagnosis:    ICD-10-CM   1. Schizoaffective disorder, depressive type (HCC)  F25.1 Valproic acid level    2. Other fatigue  R53.83 TSH  Ambulatory referral to Pulmonology    3. Obsessive-compulsive disorder, unspecified type  F42.9     4. Anxiety state  F41.1       Past Psychiatric History: Please see initial evaluation for full details. I have reviewed the history. No updates at this time.     Past Medical History:  Past Medical History:  Diagnosis Date   Allergic rhinitis    Anxiety    Asthma    Bipolar disorder (HCC)    Depression    Diabetes mellitus without complication (HCC)    GERD (gastroesophageal reflux disease)    Hyperlipidemia    Hypertension    Mild sleep apnea    OCD (obsessive compulsive disorder)    Palpitations    Paranoia (HCC)    Schizoaffective disorder  (HCC)    Sleep apnea     Past Surgical History:  Procedure Laterality Date   CESAREAN SECTION  2006/2008   CHOLECYSTECTOMY  2006    Family Psychiatric History: Please see initial evaluation for full details. I have reviewed the history. No updates at this time.     Family History:  Family History  Problem Relation Age of Onset   Lupus Mother    Diabetes Mother        pre diabetes   Diabetes Father    Hyperlipidemia Father    Diabetes Brother    Depression Paternal Grandmother    Bipolar disorder Paternal Grandmother     Social History:  Social History   Socioeconomic History   Marital status: Married    Spouse name: Kaelei Wheeler   Number of children: 2   Years of education: 16   Highest education level: Bachelor's degree (e.g., BA, AB, BS)  Occupational History   Occupation: disability  Tobacco Use   Smoking status: Never   Smokeless tobacco: Never  Vaping Use   Vaping Use: Never used  Substance and Sexual Activity   Alcohol use: Not Currently   Drug use: Never   Sexual activity: Yes    Partners: Male    Birth control/protection: Surgical  Other Topics Concern   Not on file  Social History Narrative   Not on file   Social Determinants of Health   Financial Resource Strain: Low Risk  (09/11/2021)   Overall Financial Resource Strain (CARDIA)    Difficulty of Paying Living Expenses: Not hard at all  Food Insecurity: No Food Insecurity (09/11/2021)   Hunger Vital Sign    Worried About Running Out of Food in the Last Year: Never true    Ran Out of Food in the Last Year: Never true  Transportation Needs: No Transportation Needs (09/11/2021)   PRAPARE - Administrator, Civil Service (Medical): No    Lack of Transportation (Non-Medical): No  Physical Activity: Inactive (09/11/2021)   Exercise Vital Sign    Days of Exercise per Week: 0 days    Minutes of Exercise per Session: 0 min  Stress: Stress Concern Present (09/11/2021)   [employer] of  Occupational Health - Occupational Stress Questionnaire    Feeling of Stress : Rather much  Social Connections: Socially Integrated (09/11/2021)   Social Connection and Isolation Panel [NHANES]    Frequency of Communication with Friends and Family: Once a week    Frequency of Social Gatherings with Friends and Family: More than three times a week    Attends Religious Services: More than 4 times per year    Active Member of [employer] or Organizations: Yes  Attends Banker Meetings: More than 4 times per year    Marital Status: Married    Allergies:  Allergies  Allergen Reactions   Abilify [Aripiprazole] Hives    Metabolic Disorder Labs: Lab Results  Component Value Date   HGBA1C 7.6 (H) 05/21/2021   MPG 171 05/21/2021   MPG 166 01/08/2021   Lab Results  Component Value Date   PROLACTIN 84.8 (H) 06/01/2017   PROLACTIN 36.2 (H) 03/26/2016   Lab Results  Component Value Date   CHOL 140 05/21/2021   TRIG 236 (H) 05/21/2021   HDL 52 05/21/2021   CHOLHDL 2.7 05/21/2021   VLDL 51 (H) 04/23/2017   LDLCALC 59 05/21/2021   LDLCALC  01/08/2021     Comment:     . LDL cholesterol not calculated. Triglyceride levels greater than 400 mg/dL invalidate calculated LDL results. . Reference range: <100 . Desirable range <100 mg/dL for primary prevention;   <70 mg/dL for patients with CHD or diabetic patients  with > or = 2 CHD risk factors. Marland Kitchen LDL-C is now calculated using the Martin-Hopkins  calculation, which is a validated novel method providing  better accuracy than the Friedewald equation in the  estimation of LDL-C.  Horald Pollen et al. Lenox Ahr. 9767;341(93): 2061-2068  (http://education.QuestDiagnostics.com/faq/FAQ164)    Lab Results  Component Value Date   TSH 1.290 04/28/2018   TSH 2.570 06/01/2017    Therapeutic Level Labs: No results found for: "LITHIUM" Lab Results  Component Value Date   VALPROATE 52 10/22/2017   VALPROATE 43 (L) 06/01/2017   No  results found for: "CBMZ"  Current Medications: Current Outpatient Medications  Medication Sig Dispense Refill   ACCU-CHEK GUIDE test strip TEST TWICE DAILY 100 strip 2   albuterol (VENTOLIN HFA) 108 (90 Base) MCG/ACT inhaler INHALE 2 PUFFS INTO THE LUNGS EVERY 6 HOURS AS NEEDED FOR WHEEZING OR SHORTNESS OF BREATH 25.5 g 3   atorvastatin (LIPITOR) 20 MG tablet TAKE 1 TABLET(20 MG) BY MOUTH AT BEDTIME 90 tablet 2   divalproex (DEPAKOTE ER) 500 MG 24 hr tablet Take 1 tablet (500 mg total) by mouth daily. 90 tablet 0   EPINEPHrine 0.3 mg/0.3 mL IJ SOAJ injection Inject 0.3 mg into the muscle as needed for anaphylaxis. 1 each 0   ezetimibe (ZETIA) 10 MG tablet TAKE 1 TABLET(10 MG) BY MOUTH DAILY 90 tablet 1   fluticasone (FLOVENT HFA) 110 MCG/ACT inhaler Inhale 1 puff into the lungs 2 (two) times daily. 1 Inhaler 11   gabapentin (NEURONTIN) 600 MG tablet Take 1 tablet (600 mg total) by mouth 3 (three) times daily. 270 tablet 0   hydrochlorothiazide (HYDRODIURIL) 12.5 MG tablet TAKE 1 TABLET(12.5 MG) BY MOUTH DAILY 90 tablet 1   ibuprofen (ADVIL) 200 MG tablet Take 200 mg by mouth every 6 (six) hours as needed.     insulin degludec (TRESIBA FLEXTOUCH) 100 UNIT/ML FlexTouch Pen INJECT 40 UNITS UNDER THE SKIN EVERY DAY 15 mL 5   Insulin Pen Needle (BD PEN NEEDLE NANO U/F) 32G X 4 MM MISC 1 each by Does not apply route daily. 100 each 12   MELATONIN PO Take 5 mg by mouth at bedtime as needed.     metFORMIN (GLUCOPHAGE) 500 MG tablet TAKE 2 TABLETS(1000 MG) BY MOUTH TWICE DAILY WITH A MEAL 360 tablet 1   NOVOTWIST 32G X 5 MM MISC USE WITH LEVEMIR PEN BID  5   OLANZapine (ZYPREXA) 7.5 MG tablet Take 1 tablet (7.5 mg total) by mouth  at bedtime. 90 tablet 0   omeprazole (PRILOSEC) 10 MG capsule TAKE 1 CAPSULE BY MOUTH DAILY 90 capsule 1   OZEMPIC, 1 MG/DOSE, 4 MG/3ML SOPN INJECT 1 MG INTO THE SKIN ONCE A WEEK (Patient not taking: Reported on 10/27/2021) 9 mL 0   paliperidone (INVEGA SUSTENNA) 234 MG/1.5ML  SUSY injection Inject 234 mg into the muscle every 28 (twenty-eight) days. 1.5 mL 11   pioglitazone (ACTOS) 45 MG tablet TAKE 1 TABLET(45 MG) BY MOUTH DAILY 90 tablet 1   sulfamethoxazole-trimethoprim (BACTRIM) 400-80 MG tablet Take 1 tablet by mouth 2 (two) times daily. 10 tablet 0   Vilazodone HCl (VIIBRYD) 40 MG TABS Take 1 tablet (40 mg total) by mouth daily. 90 tablet 1   Current Facility-Administered Medications  Medication Dose Route Frequency Provider Last Rate Last Admin   paliperidone (INVEGA SUSTENNA) injection 234 mg  234 mg Intramuscular Q28 days [name], MD   234 mg at 10/02/21 1127     Musculoskeletal: Strength & Muscle Tone:  N/A Gait & Station:  N/A Patient leans: N/A  Psychiatric Specialty Exam: Review of Systems  Psychiatric/Behavioral:  Positive for dysphoric mood, hallucinations and sleep disturbance. Negative for agitation, behavioral problems, confusion, decreased concentration, self-injury and suicidal ideas. The patient is nervous/anxious. The patient is not hyperactive.   All other systems reviewed and are negative.   There were no vitals taken for this visit.There is no height or weight on file to calculate BMI.  General Appearance: Fairly Groomed  Eye Contact:  Good  Speech:  Clear and Coherent  Volume:  Normal  Mood:   better  Affect:  Appropriate, Congruent, and calm, less down  Thought Process:  Coherent  Orientation:  Full (Time, Place, and Person)  Thought Content: Logical   Suicidal Thoughts:  No  Homicidal Thoughts:  No  Memory:  Immediate;   Good  Judgement:  Good  Insight:  Good  Psychomotor Activity:  Normal  Concentration:  Concentration: Good and Attention Span: Good  Recall:  Good  Fund of Knowledge: Good  Language: Good  Akathisia:  No  Handed:  Right  AIMS (if indicated): not done  Assets:  Communication Skills Desire for Improvement  ADL's:  Intact  Cognition: WNL  Sleep:   hypersomnia   Screenings: AIMS     Flowsheet Row Office Visit from 05/12/2017 in [facility] Office Visit from 07/29/2016 in [facility] Office Visit from 05/06/2016 in [facility] Office Visit from 04/01/2016 in [facility] Office Visit from 03/28/2015 in [facility]  AIMS Total Score 0 0 0 0 0      GAD-7    Flowsheet Row Office Visit from 08/28/2021 in [facility] Office Visit from 05/21/2021 in [facility] Office Visit from 01/07/2021 in [facility] Office Visit from 04/28/2018 in [facility]  Total GAD-7 Score 4 14 7 8       Mini-Mental    Flowsheet Row Office Visit from 09/07/2019 in [facility]  Total Score (max 30 points ) 16      PHQ2-9    Flowsheet Row Office Visit from 09/08/2021 in [facility] Office Visit from 08/28/2021 in [facility] Office Visit from 07/03/2021 in [facility] Office Visit from 05/21/2021 in [facility] Office Visit from 04/07/2021 in [facility]  PHQ-2 Total Score 3 2 2 4  4  PHQ-9 Total Score 19 11 13 13 15       Flowsheet Row Office Visit from 09/08/2021 in [facility] Office Visit from 07/03/2021 in [facility] Office Visit from 04/07/2021 in [facility]  C-SSRS RISK CATEGORY Error: Q3, 4, or 5 should not be populated when Q2 is No No Risk Low Risk        Assessment and Plan:  Darl HouseholderKathleen J Fluckiger is a 51 y.o. year old female with a history of schizoaffective disorder,  sleep apnea (using CPAP machine), diabetes, hypertension, hyperlipidemia, GERD, who presents for follow up appointment for below.   1. Schizoaffective disorder, depressive type (HCC) 3. Obsessive-compulsive disorder,  unspecified type 4. Anxiety state There has been overall improvement in depressive symptoms and anxiety, which coincided with her children being at home, and improvement in adherence to medication.  Recent psychosocial stressors includes the court issues around her husband, although she declined to talk in details.  Will continue current medication regimen given she was recently back on the current dose of olanzapine.  Will continue Invega IM, olanzapine. Noted that she had residual hallucinations with Invega injection only, and has benefit from 2 antipsychotics.  Will continue Depakote to target schizoaffective disorder given she reports great benefit in the past.  She was reminded again to obtain blood test.   Will continue Viibryd to target depression and anxiety.  Will continue gabapentin for anxiety.   2. Other fatigue She reports fatigue, hypersomnia.  Although she has been using CPAP machine for many years, she has not rechecked it, and is not sure who to contact with.  We make referral for evaluation of sleep apnea.  Will obtain blood test to rule out any medical health issues contributing to fatigue.    Plan (she will contact the office if she needs a refill) Continue monthly invega 234 mg IM - monitor weight gain. She is on metformin.  Continue olanzapine 7.5 mg at night - monitor weight gain Continue Depakote 500 mg daily Obtain labs (VPA) 5 days after taking Depakote consistently, TSH Continue Viibryd 40 mg daily Continue gabapentin 600 mg 3 times a day Next appointment- 8/14 at 10:30 for 30 mins, in person - She will [facility] at [facility]   Past trials of medication: Abilify (hives), olanzapine, quetiapine, risperidone (stopped working),       This clinician has discussed the side effect associated with medication prescribed during this encounter. Please refer to notes in the previous encounters for more details.    Collaboration of Care: Collaboration of Care: Other  N/A  Patient/Guardian was advised Release of Information must be obtained prior to any record release in order to collaborate their care with an outside provider. Patient/Guardian was advised if they have not already done so to contact the registration department to sign all necessary forms in order for us to release information regarding their care.   Consent: Patient/Guardian gives verbal consent for treatment and assignment of benefits for services provided during this visit. Patient/Guardian expressed understanding and agreed to proceed.    Neysa Hottereina Simrit Gohlke, MD 10/27/2021, 11:01 AM

## 2021-10-27 NOTE — Patient Instructions (Signed)
Continue monthly invega 234 mg IM  Continue olanzapine 7.5 mg at night  Continue Depakote 500 mg daily Obtain labs (VPA) 5 days after taking depakote consistently, TSH Continue Viibryd 40 mg daily Continue gabapentin 600 mg 3 times a day Next appointment- 8/14 at 10:30

## 2021-10-29 ENCOUNTER — Telehealth (HOSPITAL_COMMUNITY): Payer: Self-pay | Admitting: *Deleted

## 2021-10-29 ENCOUNTER — Other Ambulatory Visit: Payer: Self-pay | Admitting: Psychiatry

## 2021-10-29 ENCOUNTER — Telehealth: Payer: Medicare Other | Admitting: Psychiatry

## 2021-10-29 MED ORDER — INVEGA SUSTENNA 234 MG/1.5ML IM SUSY: 234.0000 mg | PREFILLED_SYRINGE | INTRAMUSCULAR | 11 refills | Status: DC

## 2021-10-29 NOTE — Telephone Encounter (Signed)
Fax rx requests received at the [place] office for patients Invega Sustenna 234 mg. Will forward request to the [place] office as she is a patient of Dr Romie Jumper.

## 2021-10-30 ENCOUNTER — Ambulatory Visit: Payer: Medicare Other

## 2021-11-03 ENCOUNTER — Telehealth: Payer: Self-pay

## 2021-11-03 ENCOUNTER — Ambulatory Visit (INDEPENDENT_AMBULATORY_CARE_PROVIDER_SITE_OTHER): Payer: Medicare Other

## 2021-11-03 ENCOUNTER — Other Ambulatory Visit: Payer: Self-pay | Admitting: Psychiatry

## 2021-11-03 DIAGNOSIS — F3170 Bipolar disorder, currently in remission, most recent episode unspecified: Secondary | ICD-10-CM | POA: Diagnosis not present

## 2021-11-03 DIAGNOSIS — F422 Mixed obsessional thoughts and acts: Secondary | ICD-10-CM | POA: Diagnosis not present

## 2021-11-03 DIAGNOSIS — F251 Schizoaffective disorder, depressive type: Secondary | ICD-10-CM | POA: Diagnosis not present

## 2021-11-03 MED ORDER — DIVALPROEX SODIUM ER 500 MG PO TB24: 500.0000 mg | ORAL_TABLET | Freq: Every day | ORAL | 0 refills | Status: DC

## 2021-11-03 NOTE — Telephone Encounter (Signed)
Ordered

## 2021-11-03 NOTE — Patient Instructions (Signed)
pt was given invega sustenna 234mg /1.37ml in the upper right out quatient.   pt appears clean and in a good mood.  she talked about her boys having things to do this summer .  She and her husband went to see son baseball game over the holiday.  Pt talked normal. Pt states she can feel it when the mediation is wearing off.    s/n # [id number]  exp 12-24  lot # [id number]  NDC# [phone]

## 2021-11-03 NOTE — Telephone Encounter (Signed)
Pt last seen on 10-27-21 and next appt 12-08-21 needs refills on divalproex  divalproex (DEPAKOTE ER) 500 MG 24 hr tablet Medication Expired Date: 07/03/2021 Department: [facility] Ordering/Authorizing: [name], MD   Order Providers  Prescribing Provider Encounter Provider  [name], MD [name], MD   Outpatient Medication Detail   Disp Refills Start End   divalproex (DEPAKOTE ER) 500 MG 24 hr tablet 90 tablet 0 07/03/2021 10/01/2021   Sig - Route: Take 1 tablet (500 mg total) by mouth daily. - Oral   Sent to pharmacy as: divalproex (DEPAKOTE ER) 500 MG 24 hr tablet   E-Prescribing Status: Receipt confirmed by pharmacy (07/03/2021  5:09 PM EST)

## 2021-11-18 ENCOUNTER — Other Ambulatory Visit: Payer: Self-pay | Admitting: Internal Medicine

## 2021-11-18 DIAGNOSIS — G4733 Obstructive sleep apnea (adult) (pediatric): Secondary | ICD-10-CM | POA: Diagnosis not present

## 2021-11-19 ENCOUNTER — Other Ambulatory Visit: Payer: Self-pay | Admitting: Internal Medicine

## 2021-11-19 DIAGNOSIS — E119 Type 2 diabetes mellitus without complications: Secondary | ICD-10-CM

## 2021-11-19 NOTE — Telephone Encounter (Signed)
Requested Prescriptions  Pending Prescriptions Disp Refills  . ACCU-CHEK GUIDE test strip [Pharmacy Med Name: ACCU-CHEK GUIDE TEST STRIPS 100S] 100 strip 2    Sig: USE AS DIRECTED TO TEST TWICE DAILY     Endocrinology: Diabetes - Testing Supplies Passed - 11/18/2021 11:45 AM      Passed - Valid encounter within last 12 months    Recent Outpatient Visits          2 months ago Acute cystitis without hematuria   [facility] [place], Salvadore Oxford, NP   6 months ago Encounter for general adult medical examination with abnormal findings   [facility], Salvadore Oxford, NP   10 months ago Influenza vaccine needed   [facility], Salvadore Oxford, NP   11 months ago COVID-19   [facility] [place], Salvadore Oxford, NP   1 year ago Encounter to establish care with new doctor   [facility], Jodelle Gross, [place]

## 2021-11-20 NOTE — Telephone Encounter (Signed)
Requested Prescriptions  Pending Prescriptions Disp Refills  . pioglitazone (ACTOS) 45 MG tablet [Pharmacy Med Name: PIOGLITAZONE 45MG  TABLETS] 90 tablet 0    Sig: TAKE 1 TABLET(45 MG) BY MOUTH DAILY     Endocrinology:  Diabetes - Glitazones - pioglitazone Failed - 11/19/2021  8:27 AM      Failed - HBA1C is between 0 and 7.9 and within 180 days    HB A1C (BAYER DCA - WAIVED)  Date Value Ref Range Status  10/22/2017 6.9 <7.0 % Final    Comment:                                          Diabetic Adult            <7.0                                       Healthy Adult        4.3 - 5.7                                                           (DCCT/NGSP) American Diabetes Association's Summary of Glycemic Recommendations for Adults with Diabetes: Hemoglobin A1c <7.0%. More stringent glycemic goals (A1c <6.0%) may further reduce complications at the cost of increased risk of hypoglycemia.    Hgb A1c MFr Bld  Date Value Ref Range Status  05/21/2021 7.6 (H) <5.7 % of total Hgb Final    Comment:    For someone without known diabetes, a hemoglobin A1c value of 6.5% or greater indicates that they may have  diabetes and this should be confirmed with a follow-up  test. . For someone with known diabetes, a value <7% indicates  that their diabetes is well controlled and a value  greater than or equal to 7% indicates suboptimal  control. A1c targets should be individualized based on  duration of diabetes, age, comorbid conditions, and  other considerations. . Currently, no consensus exists regarding use of hemoglobin A1c for diagnosis of diabetes for children. 05/23/2021 - Valid encounter within last 6 months    Recent Outpatient Visits          2 months ago Acute cystitis without hematuria   [facility] [place], [place], NP   6 months ago Encounter for general adult medical examination with abnormal findings   [facility] [place], Mullins, NP    10 months ago Influenza vaccine needed   [facility] [place], Mullins, NP   11 months ago COVID-19   [facility] [place], Mullins, NP   1 year ago Encounter to establish care with new doctor   [facility], Jodelle Gross

## 2021-11-24 ENCOUNTER — Telehealth: Payer: Self-pay | Admitting: Internal Medicine

## 2021-11-24 ENCOUNTER — Other Ambulatory Visit: Payer: Self-pay

## 2021-11-24 DIAGNOSIS — Z794 Long term (current) use of insulin: Secondary | ICD-10-CM

## 2021-11-24 MED ORDER — BD PEN NEEDLE NANO U/F 32G X 4 MM MISC: 1.0000 | Freq: Every day | 2 refills | Status: DC

## 2021-11-24 NOTE — Telephone Encounter (Unsigned)
Copied from CRM [id number]. Topic: General - Other >> Nov 24, 2021  1:19 PM Everette C wrote: Reason for CRM: Medication Refill - Medication: Insulin Pen Needle (BD PEN NEEDLE NANO U/F) 32G X 4 MM MISC [[id number]]   Has the patient contacted their pharmacy? Yes.  The patient was directed to contact their PCP  (Agent: If no, request that the patient contact the pharmacy for the refill. If patient does not wish to contact the pharmacy document the reason why and proceed with request.) (Agent: If yes, when and what did the pharmacy advise?)  Preferred Pharmacy (with phone number or street name): [id number] [place], [place] - [address] AT [facility] OF [address] [place] [address] [place] [address] Phone: [phone] Fax: [phone] Hours: Not open 24 hours   Has the patient been seen for an appointment in the last year OR does the patient have an upcoming appointment? Yes.    Agent: Please be advised that RX refills may take up to 3 business days. We ask that you follow-up with your pharmacy.

## 2021-11-24 NOTE — Telephone Encounter (Signed)
Refill sent.  KP 

## 2021-11-28 ENCOUNTER — Ambulatory Visit (INDEPENDENT_AMBULATORY_CARE_PROVIDER_SITE_OTHER): Payer: Medicare Other

## 2021-11-28 DIAGNOSIS — F3170 Bipolar disorder, currently in remission, most recent episode unspecified: Secondary | ICD-10-CM

## 2021-11-28 DIAGNOSIS — F422 Mixed obsessional thoughts and acts: Secondary | ICD-10-CM | POA: Diagnosis not present

## 2021-11-28 DIAGNOSIS — F251 Schizoaffective disorder, depressive type: Secondary | ICD-10-CM | POA: Diagnosis not present

## 2021-11-28 NOTE — Patient Instructions (Signed)
pt was given invega sustenna 234mg /1.26ml in the upper left out quatient.   pt appears clean and in a good mood.  she talked about her boys going back to school. but one of her sons is choughing really bad. pt states she not has any thoughts or hallucations.  She feels she is doing find.    Invega sustenna 234mg /1.5 ml  NDC 4m Lot# [id number] EXP [id number]   S/N# [id number]   GTTN# [id number].  Pt to return in 28 days for next injection.

## 2021-12-01 ENCOUNTER — Telehealth: Payer: Self-pay

## 2021-12-01 ENCOUNTER — Other Ambulatory Visit: Payer: Self-pay | Admitting: Psychiatry

## 2021-12-01 DIAGNOSIS — F251 Schizoaffective disorder, depressive type: Secondary | ICD-10-CM

## 2021-12-01 DIAGNOSIS — F429 Obsessive-compulsive disorder, unspecified: Secondary | ICD-10-CM

## 2021-12-01 MED ORDER — VILAZODONE HCL 40 MG PO TABS: 40.0000 mg | ORAL_TABLET | Freq: Every day | ORAL | 1 refills | Status: DC

## 2021-12-01 NOTE — Telephone Encounter (Signed)
received fax requesting a refill on the vilazodone 40mg .  pt was last seen on 10-27-21 next appt 12-08-21  Vilazodone HCl (VIIBRYD) 40 MG TABS Medication Expired Date: 04/07/2021 Department: [facility] Ordering/Authorizing: [facility], MD   Order Providers  Prescribing Provider Encounter Provider  [name], MD [name], MD   Outpatient Medication Detail   Disp Refills Start End   Vilazodone HCl (VIIBRYD) 40 MG TABS 90 tablet 1 04/16/2021 10/13/2021   Sig - Route: Take 1 tablet (40 mg total) by mouth daily. - Oral   Sent to pharmacy as: Vilazodone HCl (VIIBRYD) 40 MG Tab   E-Prescribing Status: Receipt confirmed by pharmacy (04/07/2021 11:38 AM EST)

## 2021-12-01 NOTE — Telephone Encounter (Signed)
ORdered.

## 2021-12-02 ENCOUNTER — Ambulatory Visit: Payer: Medicare Other | Admitting: Internal Medicine

## 2021-12-03 ENCOUNTER — Other Ambulatory Visit: Payer: Self-pay | Admitting: Internal Medicine

## 2021-12-03 NOTE — Telephone Encounter (Signed)
Requested Prescriptions  Pending Prescriptions Disp Refills  . insulin degludec (TRESIBA FLEXTOUCH) 100 UNIT/ML FlexTouch Pen [Pharmacy Med Name: TRESIBA FLEXTOUCH PEN ([id number]] 15 mL 2    Sig: ADMINISTER 40 UNITS UNDER THE SKIN EVERY DAY     Endocrinology:  Diabetes - Insulins Failed - 12/03/2021 11:32 AM      Failed - HBA1C is between 0 and 7.9 and within 180 days    HB A1C (BAYER DCA - WAIVED)  Date Value Ref Range Status  10/22/2017 6.9 <7.0 % Final    Comment:                                          Diabetic Adult            <7.0                                       Healthy Adult        4.3 - 5.7                                                           (DCCT/NGSP) American Diabetes Association's Summary of Glycemic Recommendations for Adults with Diabetes: Hemoglobin A1c <7.0%. More stringent glycemic goals (A1c <6.0%) may further reduce complications at the cost of increased risk of hypoglycemia.    Hgb A1c MFr Bld  Date Value Ref Range Status  05/21/2021 7.6 (H) <5.7 % of total Hgb Final    Comment:    For someone without known diabetes, a hemoglobin A1c value of 6.5% or greater indicates that they may have  diabetes and this should be confirmed with a follow-up  test. . For someone with known diabetes, a value <7% indicates  that their diabetes is well controlled and a value  greater than or equal to 7% indicates suboptimal  control. A1c targets should be individualized based on  duration of diabetes, age, comorbid conditions, and  other considerations. . Currently, no consensus exists regarding use of hemoglobin A1c for diagnosis of diabetes for children. Verna Czech - Valid encounter within last 6 months    Recent Outpatient Visits          3 months ago Acute cystitis without hematuria   [facility] [place], Salvadore Oxford, NP   6 months ago Encounter for general adult medical examination with abnormal findings   [facility] [place], Salvadore Oxford, NP   11 months ago Influenza vaccine needed   [facility] [place], Salvadore Oxford, NP   1 year ago COVID-19   [facility] [place], Salvadore Oxford, NP   1 year ago Encounter to establish care with new doctor   [facility], Jodelle Gross, FNP      Future Appointments            In 1 week Sampson Si, Salvadore Oxford, NP [facility], [facility]

## 2021-12-06 NOTE — Progress Notes (Unsigned)
Virtual Visit via Video Note  I connected with Tabitha [name] on 12/08/21 at 10:30 AM EDT by a video enabled telemedicine application and verified that I am speaking with the correct person using two identifiers.  Location: Patient: home Provider: office Persons participated in the visit- patient, provider    I discussed the limitations of evaluation and management by telemedicine and the availability of in person appointments. The patient expressed understanding and agreed to proceed.     I discussed the assessment and treatment plan with the patient. The patient was provided an opportunity to ask questions and all were answered. The patient agreed with the plan and demonstrated an understanding of the instructions.   The patient was advised to call back or seek an in-person evaluation if the symptoms worsen or if the condition fails to improve as anticipated.  I provided 13 minutes of non-face-to-face time during this encounter.   Tabitha Hotter, MD    [facility] MD/PA/NP OP Progress Note  12/08/2021 10:56 AM Tabitha [name]  MRN:  [id number]  Chief Complaint:  Chief Complaint  Patient presents with   Follow-up   Other   HPI:  This is a follow-up appointment for schizoaffective disorder and then anxiety.  She states that her mood is not too bad.  Although she was feeling paranoid about the new pastor, he appears to be good.  Although she continues to have paranoia that other people might be looking at her, she has been able to manage it better.  She reports good relationship with her husband.  She thinks her husband is innocent, although he is registered as a sex offender.  He was recommended for therapy, and they have been trying to find the one.  She reports good relationship with her children. She continues to feel fatigue and hypersomnia.  She is an upcoming appointment with a sleep specialist.  She feels less down and less anxious.  She thinks she has increasing in appetite.  She  denies SI.  She denies decreased need for sleep or euphonia.  She has AH of her family calling her name.  She denies HI or CAH.  She denies VH.  She occasionally notices galactorrhea, although it is not bothering her.  She feels comfortable to stay on the current medication.   245 lbs Wt Readings from Last 3 Encounters:  11/28/21 248 lb 9.6 oz (112.8 kg)  11/03/21 245 lb 3.2 oz (111.2 kg)  10/02/21 242 lb 3.2 oz (109.9 kg)      Daily routine: house chores, goes to church weekly. Choir on Wed,  Exercise: Employment: unemployed, on disability due to paranoia (could not keep her job due to paranoia that people coming to her workplace). used to work at [employer] until 2007 (quit due to pregnancy) Support: husband, son, parents Household: husband, 2 sons (age 87,15) Marital status: married Number of children: 2 sons Education: college, majored in Psychologist, educational, music  Visit Diagnosis:    ICD-10-CM   1. Schizoaffective disorder, unspecified type (HCC)  F25.9 Prolactin    2. Obsessive-compulsive disorder, unspecified type  F42.9     3. Anxiety state  F41.1       Past Psychiatric History: Please see initial evaluation for full details. I have reviewed the history. No updates at this time.     Past Medical History:  Past Medical History:  Diagnosis Date   Allergic rhinitis    Anxiety    Asthma    Bipolar disorder (HCC)    Depression  Diabetes mellitus without complication (HCC)    GERD (gastroesophageal reflux disease)    Hyperlipidemia    Hypertension    Mild sleep apnea    OCD (obsessive compulsive disorder)    Palpitations    Paranoia (HCC)    Schizoaffective disorder (HCC)    Sleep apnea     Past Surgical History:  Procedure Laterality Date   CESAREAN SECTION  2006/2008   CHOLECYSTECTOMY  2006    Family Psychiatric History: Please see initial evaluation for full details. I have reviewed the history. No updates at this time.     Family History:  Family History  Problem  Relation Age of Onset   Lupus Mother    Diabetes Mother        pre diabetes   Diabetes Father    Hyperlipidemia Father    Diabetes Brother    Depression Paternal Grandmother    Bipolar disorder Paternal Grandmother     Social History:  Social History   Socioeconomic History   Marital status: Married    Spouse name: Kryssa Risenhoover   Number of children: 2   Years of education: 16   Highest education level: Bachelor's degree (e.g., BA, AB, BS)  Occupational History   Occupation: disability  Tobacco Use   Smoking status: Never   Smokeless tobacco: Never  Vaping Use   Vaping Use: Never used  Substance and Sexual Activity   Alcohol use: Not Currently   Drug use: Never   Sexual activity: Yes    Partners: Male    Birth control/protection: Surgical  Other Topics Concern   Not on file  Social History Narrative   Not on file   Social Determinants of Health   Financial Resource Strain: Low Risk  (09/11/2021)   Overall Financial Resource Strain (CARDIA)    Difficulty of Paying Living Expenses: Not hard at all  Food Insecurity: No Food Insecurity (09/11/2021)   Hunger Vital Sign    Worried About Running Out of Food in the Last Year: Never true    Ran Out of Food in the Last Year: Never true  Transportation Needs: No Transportation Needs (09/11/2021)   PRAPARE - Administrator, Civil Service (Medical): No    Lack of Transportation (Non-Medical): No  Physical Activity: Inactive (09/11/2021)   Exercise Vital Sign    Days of Exercise per Week: 0 days    Minutes of Exercise per Session: 0 min  Stress: Stress Concern Present (09/11/2021)   [employer] of Occupational Health - Occupational Stress Questionnaire    Feeling of Stress : Rather much  Social Connections: Socially Integrated (09/11/2021)   Social Connection and Isolation Panel [NHANES]    Frequency of Communication with Friends and Family: Once a week    Frequency of Social Gatherings with Friends and Family:  More than three times a week    Attends Religious Services: More than 4 times per year    Active Member of [employer] or Organizations: Yes    Attends Engineer, structural: More than 4 times per year    Marital Status: Married    Allergies:  Allergies  Allergen Reactions   Abilify [Aripiprazole] Hives    Metabolic Disorder Labs: Lab Results  Component Value Date   HGBA1C 7.6 (H) 05/21/2021   MPG 171 05/21/2021   MPG 166 01/08/2021   Lab Results  Component Value Date   PROLACTIN 84.8 (H) 06/01/2017   PROLACTIN 36.2 (H) 03/26/2016   Lab Results  Component Value  Date   CHOL 140 05/21/2021   TRIG 236 (H) 05/21/2021   HDL 52 05/21/2021   CHOLHDL 2.7 05/21/2021   VLDL 51 (H) 04/23/2017   LDLCALC 59 05/21/2021   LDLCALC  01/08/2021     Comment:     . LDL cholesterol not calculated. Triglyceride levels greater than 400 mg/dL invalidate calculated LDL results. . Reference range: <100 . Desirable range <100 mg/dL for primary prevention;   <70 mg/dL for patients with CHD or diabetic patients  with > or = 2 CHD risk factors. Marland Kitchen LDL-C is now calculated using the Martin-Hopkins  calculation, which is a validated novel method providing  better accuracy than the Friedewald equation in the  estimation of LDL-C.  Horald Pollen et al. Lenox Ahr. 9470;962(83): 2061-2068  (http://education.Quest[employer].com/faq/FAQ164)    Lab Results  Component Value Date   TSH 1.290 04/28/2018   TSH 2.570 06/01/2017    Therapeutic Level Labs: No results found for: "LITHIUM" Lab Results  Component Value Date   VALPROATE 52 10/22/2017   VALPROATE 43 (L) 06/01/2017   No results found for: "CBMZ"  Current Medications: Current Outpatient Medications  Medication Sig Dispense Refill   ACCU-CHEK GUIDE test strip USE AS DIRECTED TO TEST TWICE DAILY 100 strip 2   albuterol (VENTOLIN HFA) 108 (90 Base) MCG/ACT inhaler INHALE 2 PUFFS INTO THE LUNGS EVERY 6 HOURS AS NEEDED FOR WHEEZING OR  SHORTNESS OF BREATH 25.5 g 3   atorvastatin (LIPITOR) 20 MG tablet TAKE 1 TABLET(20 MG) BY MOUTH AT BEDTIME 90 tablet 2   divalproex (DEPAKOTE ER) 500 MG 24 hr tablet Take 1 tablet (500 mg total) by mouth daily. 90 tablet 0   EPINEPHrine 0.3 mg/0.3 mL IJ SOAJ injection Inject 0.3 mg into the muscle as needed for anaphylaxis. 1 each 0   ezetimibe (ZETIA) 10 MG tablet TAKE 1 TABLET(10 MG) BY MOUTH DAILY 90 tablet 1   fluticasone (FLOVENT HFA) 110 MCG/ACT inhaler Inhale 1 puff into the lungs 2 (two) times daily. 1 Inhaler 11   gabapentin (NEURONTIN) 600 MG tablet Take 1 tablet (600 mg total) by mouth 3 (three) times daily. 270 tablet 0   hydrochlorothiazide (HYDRODIURIL) 12.5 MG tablet TAKE 1 TABLET(12.5 MG) BY MOUTH DAILY 90 tablet 1   ibuprofen (ADVIL) 200 MG tablet Take 200 mg by mouth every 6 (six) hours as needed.     insulin degludec (TRESIBA FLEXTOUCH) 100 UNIT/ML FlexTouch Pen ADMINISTER 40 UNITS UNDER THE SKIN EVERY DAY 15 mL 2   Insulin Pen Needle (BD PEN NEEDLE NANO U/F) 32G X 4 MM MISC 1 each by Does not apply route daily. 100 each 2   MELATONIN PO Take 5 mg by mouth at bedtime as needed.     metFORMIN (GLUCOPHAGE) 500 MG tablet TAKE 2 TABLETS(1000 MG) BY MOUTH TWICE DAILY WITH A MEAL 360 tablet 1   NOVOTWIST 32G X 5 MM MISC USE WITH LEVEMIR PEN BID  5   OLANZapine (ZYPREXA) 7.5 MG tablet Take 1 tablet (7.5 mg total) by mouth at bedtime. 90 tablet 0   omeprazole (PRILOSEC) 10 MG capsule TAKE 1 CAPSULE BY MOUTH DAILY 90 capsule 1   OZEMPIC, 1 MG/DOSE, 4 MG/3ML SOPN INJECT 1 MG INTO THE SKIN ONCE A WEEK (Patient not taking: Reported on 12/08/2021) 9 mL 0   paliperidone (INVEGA SUSTENNA) 234 MG/1.5ML injection Inject 234 mg into the muscle every 28 (twenty-eight) days. 1.5 mL 11   pioglitazone (ACTOS) 45 MG tablet TAKE 1 TABLET(45 MG) BY MOUTH DAILY  90 tablet 0   sulfamethoxazole-trimethoprim (BACTRIM) 400-80 MG tablet Take 1 tablet by mouth 2 (two) times daily. 10 tablet 0   Vilazodone HCl  (VIIBRYD) 40 MG TABS Take 1 tablet (40 mg total) by mouth daily. 90 tablet 1   No current facility-administered medications for this visit.     Musculoskeletal: Strength & Muscle Tone:  N/A Gait & Station:  N/A Patient leans: N/A  Psychiatric Specialty Exam: Review of Systems  Psychiatric/Behavioral:  Positive for dysphoric mood, hallucinations and sleep disturbance. Negative for agitation, behavioral problems, confusion, decreased concentration, self-injury and suicidal ideas. The patient is nervous/anxious. The patient is not hyperactive.   All other systems reviewed and are negative.   There were no vitals taken for this visit.There is no height or weight on file to calculate BMI.  General Appearance: Fairly Groomed  Eye Contact:  Good  Speech:  Clear and Coherent  Volume:  Normal  Mood:   better  Affect:  Appropriate, Congruent, and calmer  Thought Process:  Coherent  Orientation:  Full (Time, Place, and Person)  Thought Content: Logical   Suicidal Thoughts:  No  Homicidal Thoughts:  No  Memory:  Immediate;   Good  Judgement:  Good  Insight:  Good  Psychomotor Activity:  Normal  Concentration:  Concentration: Good and Attention Span: Good  Recall:  Good  Fund of Knowledge: Good  Language: Good  Akathisia:  No  Handed:  Right  AIMS (if indicated): not done  Assets:  Communication Skills Desire for Improvement  ADL's:  Intact  Cognition: WNL  Sleep:  Poor   Screenings: AIMS    Flowsheet Row Office Visit from 05/12/2017 in [facility] Office Visit from 07/29/2016 in [facility] Office Visit from 05/06/2016 in [facility] Office Visit from 04/01/2016 in [facility] Office Visit from 03/28/2015 in [facility]  AIMS Total Score 0 0 0 0 0      GAD-7    Flowsheet Row Office Visit from 08/28/2021 in [facility]  Office Visit from 05/21/2021 in [facility] Office Visit from 01/07/2021 in [facility] Office Visit from 04/28/2018 in [facility]  Total GAD-7 Score 4 14 7 8       Mini-Mental    Flowsheet Row Office Visit from 09/07/2019 in [facility]  Total Score (max 30 points ) 16      PHQ2-9    Flowsheet Row Office Visit from 09/08/2021 in [facility] Office Visit from 08/28/2021 in [facility] Office Visit from 07/03/2021 in [facility] Office Visit from 05/21/2021 in [facility] Office Visit from 04/07/2021 in [facility]  PHQ-2 Total Score 3 2 2 4 4   PHQ-9 Total Score 19 11 13 13 15       Flowsheet Row Office Visit from 09/08/2021 in [facility] Office Visit from 07/03/2021 in [facility] Office Visit from 04/07/2021 in [facility]  C-SSRS RISK CATEGORY Error: Q3, 4, or 5 should not be populated when Q2 is No No Risk Low Risk        Assessment and Plan:  NEVEA SPIEWAK is a 51 y.o. year old female with a history of schizoaffective disorder,  sleep apnea (using CPAP machine), diabetes, hypertension, hyperlipidemia, GERD, who presents for follow up appointment for below.   1. Schizoaffective disorder, unspecified type (HCC)  2. Obsessive-compulsive disorder, unspecified type 3. Anxiety state There has been overall improvement in depressive symptoms and anxiety since being able to take medication consistently.  Recent psychosocial stressors includes her husband being registered as a sex offender.  Will continue Invega IM, and olanzapine to target schizoaffective disorder. Noted that she had residual hallucinations with Invega injection only, and has benefit from 2 antipsychotics.  Will continue Depakote to target schizoaffective disorder, which she  reports great benefit from.  She was reminded again to obtain blot test.  Will continue Viibryd to target depression and anxiety.  Will continue gabapentin for anxiety.   # Fatigue She reports fatigue, hypersomnia.  She was referred for evaluation of sleep apnea.  She is reminded again to obtain blood test.   # r/o high prolactin She reports occasional episode of galactorrhea.  Will obtain prolactin level to assess this.    Plan (she will contact the office if she needs a refill) Continue monthly invega 234 mg IM - monitor weight gain. She is on metformin.  Continue olanzapine 7.5 mg at night - monitor weight gain Continue Depakote 500 mg daily Obtain labs (VPA, TSH, prolactin) Continue Viibryd 40 mg daily Continue gabapentin 600 mg 3 times a day Next appointment- 9/28 at 11:30, in person - [facility]   Past trials of medication: Abilify (hives), olanzapine, quetiapine, risperidone (stopped working),       This clinician has discussed the side effect associated with medication prescribed during this encounter. Please refer to notes in the previous encounters for more details.      Collaboration of Care: Collaboration of Care: Other N/A  Patient/Guardian was advised Release of Information must be obtained prior to any record release in order to collaborate their care with an outside provider. Patient/Guardian was advised if they have not already done so to contact the registration department to sign all necessary forms in order for us to release information regarding their care.   Consent: Patient/Guardian gives verbal consent for treatment and assignment of benefits for services provided during this visit. Patient/Guardian expressed understanding and agreed to proceed.    Tabitha Hottereina Christyanna Mckeon, MD 12/08/2021, 10:56 AM

## 2021-12-08 ENCOUNTER — Encounter: Payer: Self-pay | Admitting: Psychiatry

## 2021-12-08 ENCOUNTER — Telehealth (INDEPENDENT_AMBULATORY_CARE_PROVIDER_SITE_OTHER): Payer: Medicare Other | Admitting: Psychiatry

## 2021-12-08 DIAGNOSIS — F411 Generalized anxiety disorder: Secondary | ICD-10-CM | POA: Diagnosis not present

## 2021-12-08 DIAGNOSIS — F259 Schizoaffective disorder, unspecified: Secondary | ICD-10-CM

## 2021-12-08 DIAGNOSIS — F429 Obsessive-compulsive disorder, unspecified: Secondary | ICD-10-CM | POA: Diagnosis not present

## 2021-12-08 NOTE — Patient Instructions (Signed)
Continue monthly invega 234 mg IM  Continue olanzapine 7.5 mg at night  Continue Depakote 500 mg daily Obtain labs (VPA, TSH, prolactin) Continue Viibryd 40 mg daily Continue gabapentin 600 mg 3 times a day Next appointment- 9/28 at 11:30

## 2021-12-16 ENCOUNTER — Ambulatory Visit (INDEPENDENT_AMBULATORY_CARE_PROVIDER_SITE_OTHER): Payer: Medicare Other | Admitting: Internal Medicine

## 2021-12-16 ENCOUNTER — Other Ambulatory Visit: Payer: Self-pay | Admitting: Internal Medicine

## 2021-12-16 ENCOUNTER — Encounter: Payer: Self-pay | Admitting: Internal Medicine

## 2021-12-16 VITALS — BP 137/76 | HR 103 | Temp 97.8°F | Wt 238.0 lb

## 2021-12-16 DIAGNOSIS — Z23 Encounter for immunization: Secondary | ICD-10-CM | POA: Diagnosis not present

## 2021-12-16 DIAGNOSIS — E1159 Type 2 diabetes mellitus with other circulatory complications: Secondary | ICD-10-CM

## 2021-12-16 DIAGNOSIS — Z5181 Encounter for therapeutic drug level monitoring: Secondary | ICD-10-CM

## 2021-12-16 DIAGNOSIS — K219 Gastro-esophageal reflux disease without esophagitis: Secondary | ICD-10-CM | POA: Diagnosis not present

## 2021-12-16 DIAGNOSIS — E1142 Type 2 diabetes mellitus with diabetic polyneuropathy: Secondary | ICD-10-CM

## 2021-12-16 DIAGNOSIS — E119 Type 2 diabetes mellitus without complications: Secondary | ICD-10-CM

## 2021-12-16 DIAGNOSIS — E162 Hypoglycemia, unspecified: Secondary | ICD-10-CM | POA: Diagnosis not present

## 2021-12-16 DIAGNOSIS — E782 Mixed hyperlipidemia: Secondary | ICD-10-CM

## 2021-12-16 DIAGNOSIS — Z794 Long term (current) use of insulin: Secondary | ICD-10-CM

## 2021-12-16 DIAGNOSIS — J00 Acute nasopharyngitis [common cold]: Secondary | ICD-10-CM

## 2021-12-16 DIAGNOSIS — G4733 Obstructive sleep apnea (adult) (pediatric): Secondary | ICD-10-CM | POA: Diagnosis not present

## 2021-12-16 DIAGNOSIS — F251 Schizoaffective disorder, depressive type: Secondary | ICD-10-CM

## 2021-12-16 DIAGNOSIS — F3170 Bipolar disorder, currently in remission, most recent episode unspecified: Secondary | ICD-10-CM

## 2021-12-16 DIAGNOSIS — K591 Functional diarrhea: Secondary | ICD-10-CM | POA: Diagnosis not present

## 2021-12-16 DIAGNOSIS — J4520 Mild intermittent asthma, uncomplicated: Secondary | ICD-10-CM | POA: Diagnosis not present

## 2021-12-16 DIAGNOSIS — F422 Mixed obsessional thoughts and acts: Secondary | ICD-10-CM

## 2021-12-16 DIAGNOSIS — I152 Hypertension secondary to endocrine disorders: Secondary | ICD-10-CM

## 2021-12-16 DIAGNOSIS — R519 Headache, unspecified: Secondary | ICD-10-CM

## 2021-12-16 DIAGNOSIS — D508 Other iron deficiency anemias: Secondary | ICD-10-CM

## 2021-12-16 LAB — POCT GLYCOSYLATED HEMOGLOBIN (HGB A1C): Hemoglobin A1C: 8.5 % — AB (ref 4.0–5.6)

## 2021-12-16 MED ORDER — AZITHROMYCIN 250 MG PO TABS: ORAL_TABLET | ORAL | 0 refills | Status: AC

## 2021-12-16 MED ORDER — DEXCOM G6 SENSOR MISC: 0 refills | Status: DC

## 2021-12-16 MED ORDER — BENZONATATE 200 MG PO CAPS: 200.0000 mg | ORAL_CAPSULE | Freq: Three times a day (TID) | ORAL | 0 refills | Status: DC | PRN

## 2021-12-16 NOTE — Assessment & Plan Note (Signed)
Managed with Depakote, Invega, Trilafon and Viibryd Depakote level and TSH per psychiatry recommendations She will continue to follow with psychiatry 

## 2021-12-16 NOTE — Assessment & Plan Note (Signed)
Encourage weight loss as this can help reduce sleep apnea symptoms She has a sleep study scheduled Continue CPAP use

## 2021-12-16 NOTE — Assessment & Plan Note (Signed)
Encourage weight loss as this can help reduce reflux symptoms Try to avoid foods that trigger reflux Continue omeprazole and Tums

## 2021-12-16 NOTE — Assessment & Plan Note (Signed)
Encourage stress reduction techniques Continue ibuprofen as needed

## 2021-12-16 NOTE — Assessment & Plan Note (Signed)
Continue Imodium as needed 

## 2021-12-16 NOTE — Progress Notes (Signed)
Subjective:    Patient ID: Tabitha [name], female    DOB: [birth year], 51 y.o.   MRN: [id number]  HPI  Patient presents to clinic today for follow-up of chronic conditions.  HTN: Her BP today is 147/80.  She is taking HCTZ as prescribed.  ECG from 08/2019 reviewed.  OSA: She averages 7 hours of sleep per night with use of her CPAP.  There is no sleep study on file. She has an appt for a sleep study 12/30/21.  Asthma: She reports intermittent shortness of breath but denies chronic cough.  She is taking Flovent and Albuterol as prescribed.  There are no PFTs on file.  GERD: She does have occasional breakthrough on Omeprazole for which she takes Tums as needed with good relief of symptoms.  Upper GI from 11/2012 reviewed.  Diarrhea: Chronic.  She takes Imodium only as needed.  There is no colonoscopy on file.  She does not follow with GI.  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  She is taking Metformin, Actos, Tresiba, and Gabapentin as prescribed.  Her sugars range 154-303.  She does not check her feet routinely.  Her last eye exam was more than 1 year ago.  Flu 12/2020.  COVID never.  Pneumovax 04/2015.  Prevnar 20, 04/2021.  OCD/Schizoaffective/Bipolar disorder: Managed on Depakote, Invega, Trilafon and Viibryd.  She is currently seeing a therapist and a psychiatrist.  She denies SI/HI.  IDA: Her last H/H was 12/35.9, 04/2021.  She is taking oral Iron as prescribed.  She does not follow with hematology.  HLD: Her last LDL was 59, triglycerides 814, 04/2021.  She denies myalgias on Atorvastatin and Ezetimibe.  She tries to consume a low-fat diet.  Frequent Headaches: These occur 1 time a month.  Triggered by stress.  She takes Ibuprofen as needed with good relief of symptoms.  She does not follow with neurology.  She also reports headache, nasal congestion, sore throat, cough and chest tightness. This started 1 week ago.  The headache is located in her forehead.  She describes  the pain as pressure.  She is blowing clear mucous out of her nose. She denies difficulty swallowing. The cough is non productive. She has had chills but denies fever or body aches. She has tried Dayquil, Ibuprofen and Formula 44 D. She has had sick contacts with similar symptoms.   Review of Systems     Past Medical History:  Diagnosis Date   Allergic rhinitis    Anxiety    Asthma    Bipolar disorder (HCC)    Depression    Diabetes mellitus without complication (HCC)    GERD (gastroesophageal reflux disease)    Hyperlipidemia    Hypertension    Mild sleep apnea    OCD (obsessive compulsive disorder)    Palpitations    Paranoia (HCC)    Schizoaffective disorder (HCC)    Sleep apnea     Current Outpatient Medications  Medication Sig Dispense Refill   ACCU-CHEK GUIDE test strip USE AS DIRECTED TO TEST TWICE DAILY 100 strip 2   albuterol (VENTOLIN HFA) 108 (90 Base) MCG/ACT inhaler INHALE 2 PUFFS INTO THE LUNGS EVERY 6 HOURS AS NEEDED FOR WHEEZING OR SHORTNESS OF BREATH 25.5 g 3   atorvastatin (LIPITOR) 20 MG tablet TAKE 1 TABLET(20 MG) BY MOUTH AT BEDTIME 90 tablet 2   divalproex (DEPAKOTE ER) 500 MG 24 hr tablet Take 1 tablet (500 mg total) by mouth daily. 90 tablet 0  EPINEPHrine 0.3 mg/0.3 mL IJ SOAJ injection Inject 0.3 mg into the muscle as needed for anaphylaxis. 1 each 0   ezetimibe (ZETIA) 10 MG tablet TAKE 1 TABLET(10 MG) BY MOUTH DAILY 90 tablet 1   fluticasone (FLOVENT HFA) 110 MCG/ACT inhaler Inhale 1 puff into the lungs 2 (two) times daily. 1 Inhaler 11   gabapentin (NEURONTIN) 600 MG tablet Take 1 tablet (600 mg total) by mouth 3 (three) times daily. 270 tablet 0   hydrochlorothiazide (HYDRODIURIL) 12.5 MG tablet TAKE 1 TABLET(12.5 MG) BY MOUTH DAILY 90 tablet 1   ibuprofen (ADVIL) 200 MG tablet Take 200 mg by mouth every 6 (six) hours as needed.     insulin degludec (TRESIBA FLEXTOUCH) 100 UNIT/ML FlexTouch Pen ADMINISTER 40 UNITS UNDER THE SKIN EVERY DAY 15 mL 2    Insulin Pen Needle (BD PEN NEEDLE NANO U/F) 32G X 4 MM MISC 1 each by Does not apply route daily. 100 each 2   MELATONIN PO Take 5 mg by mouth at bedtime as needed.     metFORMIN (GLUCOPHAGE) 500 MG tablet TAKE 2 TABLETS(1000 MG) BY MOUTH TWICE DAILY WITH A MEAL 360 tablet 1   NOVOTWIST 32G X 5 MM MISC USE WITH LEVEMIR PEN BID  5   OLANZapine (ZYPREXA) 7.5 MG tablet Take 1 tablet (7.5 mg total) by mouth at bedtime. 90 tablet 0   omeprazole (PRILOSEC) 10 MG capsule TAKE 1 CAPSULE BY MOUTH DAILY 90 capsule 1   OZEMPIC, 1 MG/DOSE, 4 MG/3ML SOPN INJECT 1 MG INTO THE SKIN ONCE A WEEK (Patient not taking: Reported on 12/08/2021) 9 mL 0   paliperidone (INVEGA SUSTENNA) 234 MG/1.5ML injection Inject 234 mg into the muscle every 28 (twenty-eight) days. 1.5 mL 11   pioglitazone (ACTOS) 45 MG tablet TAKE 1 TABLET(45 MG) BY MOUTH DAILY 90 tablet 0   sulfamethoxazole-trimethoprim (BACTRIM) 400-80 MG tablet Take 1 tablet by mouth 2 (two) times daily. 10 tablet 0   Vilazodone HCl (VIIBRYD) 40 MG TABS Take 1 tablet (40 mg total) by mouth daily. 90 tablet 1   No current facility-administered medications for this visit.    Allergies  Allergen Reactions   Abilify [Aripiprazole] Hives    Family History  Problem Relation Age of Onset   Lupus Mother    Diabetes Mother        pre diabetes   Diabetes Father    Hyperlipidemia Father    Diabetes Brother    Depression Paternal Grandmother    Bipolar disorder Paternal Grandmother     Social History   Socioeconomic History   Marital status: Married    Spouse name: Jannine Aria   Number of children: 2   Years of education: 16   Highest education level: Bachelor's degree (e.g., BA, AB, BS)  Occupational History   Occupation: disability  Tobacco Use   Smoking status: Never   Smokeless tobacco: Never  Vaping Use   Vaping Use: Never used  Substance and Sexual Activity   Alcohol use: Not Currently   Drug use: Never   Sexual activity: Yes    Partners:  Male    Birth control/protection: Surgical  Other Topics Concern   Not on file  Social History Narrative   Not on file   Social Determinants of Health   Financial Resource Strain: Low Risk  (09/11/2021)   Overall Financial Resource Strain (CARDIA)    Difficulty of Paying Living Expenses: Not hard at all  Food Insecurity: No Food Insecurity (09/11/2021)  Hunger Vital Sign    Worried About Running Out of Food in the Last Year: Never true    Ran Out of Food in the Last Year: Never true  Transportation Needs: No Transportation Needs (09/11/2021)   PRAPARE - Hydrologist (Medical): No    Lack of Transportation (Non-Medical): No  Physical Activity: Inactive (09/11/2021)   Exercise Vital Sign    Days of Exercise per Week: 0 days    Minutes of Exercise per Session: 0 min  Stress: Stress Concern Present (09/11/2021)   [place]    Feeling of Stress : Rather much  Social Connections: Socially Integrated (09/11/2021)   Social Connection and Isolation Panel [NHANES]    Frequency of Communication with Friends and Family: Once a week    Frequency of Social Gatherings with Friends and Family: More than three times a week    Attends Religious Services: More than 4 times per year    Active Member of [employer] or Organizations: Yes    Attends Archivist Meetings: More than 4 times per year    Marital Status: Married  Human resources officer Violence: Not At Risk (05/12/2017)   Humiliation, Afraid, Rape, and Kick questionnaire    Fear of Current or Ex-Partner: No    Emotionally Abused: No    Physically Abused: No    Sexually Abused: No     Constitutional: Patient reports intermittent headaches.  Denies fever, malaise, fatigue, or abrupt weight changes.  HEENT: Patient reports nasal congestion, sore throat.  Denies eye pain, eye redness, ear pain, ringing in the ears, wax buildup, runny nose, bloody  nose. Respiratory: Patient reports cough, intermittent shortness of breath.  Denies difficulty breathing, or sputum production.   Cardiovascular: Denies chest pain, chest tightness, palpitations or swelling in the hands or feet.  Gastrointestinal: Patient reports intermittent diarrhea.  Denies abdominal pain, bloating, constipation, or blood in the stool.  GU: Denies urgency, frequency, pain with urination, burning sensation, blood in urine, odor or discharge. Musculoskeletal: Denies decrease in range of motion, difficulty with gait, muscle pain or joint pain and swelling.  Skin: Denies redness, rashes, lesions or ulcercations.  Neurological: Denies dizziness, difficulty with memory, difficulty with speech or problems with balance and coordination.  Psych: Patient has a history of depression.  Denies anxiety, SI/HI.  No other specific complaints in a complete review of systems (except as listed in HPI above).  Objective:   Physical Exam  BP 137/76   Pulse (!) 103   Temp 97.8 F (36.6 C) (Oral)   Wt 238 lb (108 kg)   SpO2 95%   BMI 43.53 kg/m   Wt Readings from Last 3 Encounters:  08/28/21 246 lb (111.6 kg)  05/21/21 241 lb (109.3 kg)  01/07/21 240 lb 9.6 oz (109.1 kg)    General: Appears her stated age, obese, appears unwell but in NAD. Skin: Warm, dry and intact. No ulcerations noted. HEENT: Head: normal shape and size; Eyes: sclera white, no icterus, conjunctiva pink, PERRLA and EOMs intact; Ears: Tm's gray and intact, normal light reflex;  Throat/Mouth: Teeth present, mucosa pink and moist, + PND, no exudate, lesions or ulcerations noted.  Neck:  Neck supple, trachea midline. No masses, lumps or thyromegaly present.  Cardiovascular: Tachycardic with normal rhythm. S1,S2 noted.  No murmur, rubs or gallops noted. No JVD or BLE edema. No carotid bruits noted. Pulmonary/Chest: Normal effort and positive vesicular breath sounds. No respiratory distress.  No wheezes, rales or ronchi  noted.  Abdomen: Normal bowel sounds.  Musculoskeletal: No difficulty with gait.  Neurological: Alert and oriented.  Psychiatric: Mood and affect mildly flat. Behavior is normal. Judgment and thought content normal.    BMET    Component Value Date/Time   NA 138 05/21/2021 0915   NA 137 09/07/2019 1657   K 4.1 05/21/2021 0915   CL 103 05/21/2021 0915   CO2 29 05/21/2021 0915   GLUCOSE 158 (H) 05/21/2021 0915   BUN 14 05/21/2021 0915   BUN 14 09/07/2019 1657   CREATININE 0.67 05/21/2021 0915   CALCIUM 9.4 05/21/2021 0915   GFRNONAA 76 09/07/2019 1657   GFRAA 88 09/07/2019 1657    Lipid Panel     Component Value Date/Time   CHOL 140 05/21/2021 0915   CHOL 138 09/07/2019 1657   CHOL 176 04/23/2017 1341   TRIG 236 (H) 05/21/2021 0915   TRIG 253 (H) 04/23/2017 1341   HDL 52 05/21/2021 0915   HDL 55 09/07/2019 1657   CHOLHDL 2.7 05/21/2021 0915   VLDL 51 (H) 04/23/2017 1341   LDLCALC 59 05/21/2021 0915    CBC    Component Value Date/Time   WBC 6.0 05/21/2021 0915   RBC 4.02 05/21/2021 0915   HGB 12.0 05/21/2021 0915   HGB 13.3 04/28/2018 1604   HCT 35.9 05/21/2021 0915   HCT 39.6 04/28/2018 1604   PLT 186 05/21/2021 0915   PLT 201 04/28/2018 1604   MCV 89.3 05/21/2021 0915   MCV 86 04/28/2018 1604   MCH 29.9 05/21/2021 0915   MCHC 33.4 05/21/2021 0915   RDW 13.8 05/21/2021 0915   RDW 15.1 04/28/2018 1604   LYMPHSABS 2.4 04/28/2018 1604   MONOABS 378 09/26/2015 1154   EOSABS 0.1 04/28/2018 1604   BASOSABS 0.0 04/28/2018 1604    Hgb A1C Lab Results  Component Value Date   HGBA1C 7.6 (H) 05/21/2021           Assessment & Plan:   Upper Respiratory Infection:  Rx for Azithromycin 250 mg twice daily x5 days Rx for Tessalon 200 mg 3 times daily Encourage rest and fluids  RTC in 6 months for your annual exam Nicki Reaper, NP

## 2021-12-16 NOTE — Assessment & Plan Note (Signed)
Managed with Depakote, Invega, Trilafon and Viibryd Depakote level and TSH per psychiatry recommendations She will continue to follow with psychiatry

## 2021-12-16 NOTE — Assessment & Plan Note (Signed)
Encourage diet and exercise for weight loss 

## 2021-12-16 NOTE — Assessment & Plan Note (Signed)
POCT A1c 8.5% We will check urine microalbumin Encourage low-carb diet and exercise for weight loss She is taking [place] only as needed, advised her to take this daily and monitor fasting sugars for 2 weeks Continue gabapentin, metformin and actos Referral to endocrinology placed Advised her to schedule an appointment for an eye exam Encourage routine foot exams Flu shot today Pneumovax and Prevnar UTD She declines COVID-vaccine

## 2021-12-16 NOTE — Assessment & Plan Note (Signed)
CBC today Continue oral iron 

## 2021-12-16 NOTE — Assessment & Plan Note (Signed)
C-Met and lipid profile today Encouraged her to consume a low-fat diet Continue atorvastatin and ezetimibe

## 2021-12-16 NOTE — Assessment & Plan Note (Signed)
Elevated but manual repeat normal Continue HCTZ C-Met today Reinforced DASH diet and exercise for weight loss We will monitor

## 2021-12-16 NOTE — Telephone Encounter (Signed)
Requested Prescriptions  Pending Prescriptions Disp Refills  . ezetimibe (ZETIA) 10 MG tablet [Pharmacy Med Name: EZETIMIBE 10MG TABLETS] 90 tablet 1    Sig: TAKE 1 TABLET(10 MG) BY MOUTH DAILY     Cardiovascular:  Antilipid - Sterol Transport Inhibitors Failed - 12/16/2021  7:00 AM      Failed - Lipid Panel in normal range within the last 12 months    Cholesterol, Total  Date Value Ref Range Status  09/07/2019 138 100 - 199 mg/dL Final   Cholesterol  Date Value Ref Range Status  05/21/2021 140 <200 mg/dL Final   Cholesterol Piccolo, Waived  Date Value Ref Range Status  04/23/2017 176 <200 mg/dL Final    Comment:                            Desirable                <200                         Borderline High      200- 239                         High                     >239    LDL Cholesterol (Calc)  Date Value Ref Range Status  05/21/2021 59 mg/dL (calc) Final    Comment:    Reference range: <100 . Desirable range <100 mg/dL for primary prevention;   <70 mg/dL for patients with CHD or diabetic patients  with > or = 2 CHD risk factors. Marland Kitchen LDL-C is now calculated using the Martin-Hopkins  calculation, which is a validated novel method providing  better accuracy than the Friedewald equation in the  estimation of LDL-C.  Cresenciano Genre et al. Annamaria Helling. 4696;295(28): 2061-2068  (http://education.Quest[employer].com/faq/FAQ164)    HDL  Date Value Ref Range Status  05/21/2021 52 > OR = 50 mg/dL Final  09/07/2019 55 >39 mg/dL Final   Triglycerides  Date Value Ref Range Status  05/21/2021 236 (H) <150 mg/dL Final    Comment:    . If a non-fasting specimen was collected, consider repeat triglyceride testing on a fasting specimen if clinically indicated.  Yates Decamp et al. J. of Clin. Lipidol. 4132;4:401-027. .    Triglycerides Piccolo,Waived  Date Value Ref Range Status  04/23/2017 253 (H) <150 mg/dL Final    Comment:                            Normal                    <150                         Borderline High     150 - 199                         High                200 - 499                         Very High                >  499          Passed - AST in normal range and within 360 days    AST  Date Value Ref Range Status  10/02/2021 34 15 - 41 U/L Final   AST (SGOT) Piccolo, Waived  Date Value Ref Range Status  04/23/2017 24 11 - 38 U/L Final         Passed - ALT in normal range and within 360 days    ALT  Date Value Ref Range Status  10/02/2021 33 0 - 44 U/L Final   ALT (SGPT) Piccolo, Waived  Date Value Ref Range Status  04/23/2017 35 10 - 47 U/L Final         Passed - Patient is not pregnant      Passed - Valid encounter within last 12 months    Recent Outpatient Visits          Today Type 2 diabetes mellitus with diabetic polyneuropathy, with long-term current use of insulin [facility])   [facility] [place], Coralie Keens, NP   3 months ago Acute cystitis without hematuria   [place], NP   6 months ago Encounter for general adult medical examination with abnormal findings   [facility] [place], Coralie Keens, NP   11 months ago Influenza vaccine needed   [facility] [place], Coralie Keens, NP   1 year ago [facility] [place], Coralie Keens, NP      Future Appointments            In 3 months Baity, Coralie Keens, NP [facility], [place]           . omeprazole (PRILOSEC) 10 MG capsule [Pharmacy Med Name: OMEPRAZOLE 10MG CAPSULES] 90 capsule 1    Sig: TAKE 1 CAPSULE BY MOUTH DAILY     Gastroenterology: Proton Pump Inhibitors Passed - 12/16/2021  7:00 AM      Passed - Valid encounter within last 12 months    Recent Outpatient Visits          Today Type 2 diabetes mellitus with diabetic polyneuropathy, with long-term current use of insulin ([place])   [facility] [place], [place] W, NP   3 months ago Acute cystitis without  hematuria   [facility] [place], Coralie Keens, NP   6 months ago Encounter for general adult medical examination with abnormal findings   [facility], Coralie Keens, NP   11 months ago Influenza vaccine needed   [facility] [place], Coralie Keens, NP   1 year ago [facility] [place], Coralie Keens, NP      Future Appointments            In 3 months [place], Coralie Keens, NP [facility], [place]           . metFORMIN (GLUCOPHAGE) 500 MG tablet [Pharmacy Med Name: METFORMIN 500MG TABLETS] 360 tablet 1    Sig: TAKE 2 TABLETS(1000 MG) BY MOUTH TWICE DAILY WITH A MEAL     Endocrinology:  Diabetes - Biguanides Failed - 12/16/2021  7:00 AM      Failed - HBA1C is between 0 and 7.9 and within 180 days    Hemoglobin A1C  Date Value Ref Range Status  12/16/2021 8.5 (A) 4.0 - 5.6 % Final   HB A1C (BAYER DCA - WAIVED)  Date  Value Ref Range Status  10/22/2017 6.9 <7.0 % Final    Comment:                                          Diabetic Adult            <7.0                                       Healthy Adult        4.3 - 5.7                                                           (DCCT/NGSP) American Diabetes Association's Summary of Glycemic Recommendations for Adults with Diabetes: Hemoglobin A1c <7.0%. More stringent glycemic goals (A1c <6.0%) may further reduce complications at the cost of increased risk of hypoglycemia.    Hgb A1c MFr Bld  Date Value Ref Range Status  05/21/2021 7.6 (H) <5.7 % of total Hgb Final    Comment:    For someone without known diabetes, a hemoglobin A1c value of 6.5% or greater indicates that they may have  diabetes and this should be confirmed with a follow-up  test. . For someone with known diabetes, a value <7% indicates  that their diabetes is well controlled and a value  greater than or equal to 7% indicates suboptimal  control. A1c targets should be individualized based on   duration of diabetes, age, comorbid conditions, and  other considerations. . Currently, no consensus exists regarding use of hemoglobin A1c for diagnosis of diabetes for children. .          Failed - B12 Level in normal range and within 720 days    Vitamin B-12  Date Value Ref Range Status  06/01/2017 578 232 - 1,245 pg/mL Final         Failed - CBC within normal limits and completed in the last 12 months    WBC  Date Value Ref Range Status  05/21/2021 6.0 3.8 - 10.8 Thousand/uL Final   RBC  Date Value Ref Range Status  05/21/2021 4.02 3.80 - 5.10 Million/uL Final   Hemoglobin  Date Value Ref Range Status  05/21/2021 12.0 11.7 - 15.5 g/dL Final  04/28/2018 13.3 11.1 - 15.9 g/dL Final   HCT  Date Value Ref Range Status  05/21/2021 35.9 35.0 - 45.0 % Final   Hematocrit  Date Value Ref Range Status  04/28/2018 39.6 34.0 - 46.6 % Final   MCHC  Date Value Ref Range Status  05/21/2021 33.4 32.0 - 36.0 g/dL Final   [facility]  Date Value Ref Range Status  05/21/2021 29.9 27.0 - 33.0 pg Final   MCV  Date Value Ref Range Status  05/21/2021 89.3 80.0 - 100.0 fL Final  04/28/2018 86 79 - 97 fL Final   No results found for: "PLTCOUNTKUC", "LABPLAT", "POCPLA" RDW  Date Value Ref Range Status  05/21/2021 13.8 11.0 - 15.0 % Final  04/28/2018 15.1 12.3 - 15.4 % Final    Comment:    **Effective May 02, 2018, the RDW pediatric reference**   interval  will be removed and the adult reference interval   will be changing to:                             Female 11.7 - 15.4                                                      Female 11.6 - 15.4          Passed - Cr in normal range and within 360 days    Creat  Date Value Ref Range Status  05/21/2021 0.67 0.50 - 0.99 mg/dL Final   Creatinine, Urine  Date Value Ref Range Status  01/08/2021 23 20 - 275 mg/dL Final         Passed - eGFR in normal range and within 360 days    GFR calc Af Amer  Date Value Ref Range Status   09/07/2019 88 >59 mL/min/1.73 Final    Comment:    **[facility] currently reports eGFR in compliance with the current**   recommendations of the [employer]. [facility] will   update reporting as new guidelines are published from the NKF-ASN   Task force.    GFR calc non Af Amer  Date Value Ref Range Status  09/07/2019 76 >59 mL/min/1.73 Final   eGFR  Date Value Ref Range Status  05/21/2021 107 > OR = 60 mL/min/1.31m Final    Comment:    The eGFR is based on the CKD-EPI 2021 equation. To calculate  the new eGFR from a previous Creatinine or Cystatin C result, go to https://www.kidney.org/professionals/ kdoqi/gfr%5Fcalculator          Passed - Valid encounter within last 6 months    Recent Outpatient Visits          Today Type 2 diabetes mellitus with diabetic polyneuropathy, with long-term current use of insulin ([facility]   [facility]   3 months ago Acute cystitis without hematuria   [place] NP   6 months ago Encounter for general adult medical examination with abnormal findings   [facility]   11 months ago Influenza vaccine needed   [facility]   1 year [facility]      Future Appointments            In 3 months Baity[facility]

## 2021-12-16 NOTE — Patient Instructions (Signed)

## 2021-12-16 NOTE — Assessment & Plan Note (Signed)
Continue Flovent and albuterol 

## 2021-12-17 ENCOUNTER — Ambulatory Visit: Payer: Self-pay | Admitting: *Deleted

## 2021-12-17 DIAGNOSIS — Z5181 Encounter for therapeutic drug level monitoring: Secondary | ICD-10-CM | POA: Diagnosis not present

## 2021-12-17 DIAGNOSIS — Z794 Long term (current) use of insulin: Secondary | ICD-10-CM | POA: Diagnosis not present

## 2021-12-17 DIAGNOSIS — E1142 Type 2 diabetes mellitus with diabetic polyneuropathy: Secondary | ICD-10-CM | POA: Diagnosis not present

## 2021-12-17 NOTE — Telephone Encounter (Signed)
Summary: rx follow up / rx coordination   The patient has received their prescription for Continuous Blood Gluc Sensor (DEXCOM G6 SENSOR) MISC [[id number]]   The patient has not received the transmitter or receiver   The patient would like to speak with a member of staff regarding their uncoordinated prescriptions further   Please contact when possible      Reason for Disposition  [1] Caller has NON-URGENT medicine question about med that PCP prescribed AND [2] triager unable to answer question  Answer Assessment - Initial Assessment Questions 1. NAME of MEDICINE: "What medicine(s) are you calling about?"     Dexcon 6 2. QUESTION: "What is your question?" (e.g., double dose of medicine, side effect)     Missing receiver  3. PRESCRIBER: "Who prescribed the medicine?" Reason: if prescribed by specialist, call should be referred to that group.     PCP Call to pharmacy to check Rx- patient will need Rx for transmitter every 90 days. Patient also would like Rx for receiver sent in.  Protocols used: Medication Question Call-A-AH

## 2021-12-18 LAB — TSH: TSH: 1.56 mIU/L

## 2021-12-18 LAB — MICROALBUMIN / CREATININE URINE RATIO
Creatinine, Urine: 52 mg/dL (ref 20–275)
Microalb, Ur: <0.2 mg/dL

## 2021-12-18 LAB — BASIC METABOLIC PANEL WITH GFR
BUN: 7 mg/dL (ref 7–25)
CO2: 22 mmol/L (ref 20–32)
Calcium: 9.5 mg/dL (ref 8.6–10.2)
Chloride: 99 mmol/L (ref 98–110)
Creat: 0.71 mg/dL (ref 0.50–0.99)
Glucose, Bld: 224 mg/dL — ABNORMAL HIGH (ref 65–99)
Potassium: 3.7 mmol/L (ref 3.5–5.3)
Sodium: 136 mmol/L (ref 135–146)
eGFR: 104 mL/min/{1.73_m2} (ref >=60)

## 2021-12-18 LAB — CBC
HCT: 39.3 % (ref 35.0–45.0)
Hemoglobin: 13.1 g/dL (ref 11.7–15.5)
MCH: 29.6 pg (ref 27.0–33.0)
MCHC: 33.3 g/dL (ref 32.0–36.0)
MCV: 88.9 fL (ref 80.0–100.0)
MPV: 10.4 fL (ref 7.5–12.5)
Platelets: 175 10*3/uL (ref 140–400)
RBC: 4.42 10*6/uL (ref 3.80–5.10)
RDW: 15 % (ref 11.0–15.0)
WBC: 5.4 10*3/uL (ref 3.8–10.8)

## 2021-12-18 LAB — LIPID PANEL
Cholesterol: 167 mg/dL (ref <200)
HDL: 49 mg/dL — ABNORMAL LOW (ref >=50)
Non-HDL Cholesterol (Calc): 118 mg/dL (calc) (ref <130)
Total CHOL/HDL Ratio: 3.4 (calc) (ref <5.0)
Triglycerides: 445 mg/dL — ABNORMAL HIGH (ref <150)

## 2021-12-18 LAB — VALPROIC ACID LEVEL: Valproic Acid Lvl: 46 mg/L — ABNORMAL LOW (ref 50.0–100.0)

## 2021-12-18 NOTE — Telephone Encounter (Signed)
She should be able to download the app on her smart phone and use that in place of the transmitter and receiver.

## 2021-12-19 MED ORDER — DEXCOM G6 TRANSMITTER MISC
0 refills | Status: DC
Start: 2021-12-19 — End: 2022-03-23

## 2021-12-19 NOTE — Addendum Note (Signed)
Addended by: Kavin Leech E on: 12/19/2021 02:30 PM   Modules accepted: Orders

## 2021-12-19 NOTE — Telephone Encounter (Signed)
Pt would like to use the transmitter instead of her phone.  RX sent to [facility].    Thanks,   -Vernona Rieger

## 2021-12-21 ENCOUNTER — Encounter: Payer: Self-pay | Admitting: Internal Medicine

## 2021-12-21 DIAGNOSIS — E782 Mixed hyperlipidemia: Secondary | ICD-10-CM

## 2021-12-22 MED ORDER — ATORVASTATIN CALCIUM 40 MG PO TABS: 40.0000 mg | ORAL_TABLET | Freq: Every day | ORAL | 1 refills | Status: DC

## 2021-12-25 ENCOUNTER — Other Ambulatory Visit: Payer: Self-pay | Admitting: Psychiatry

## 2021-12-26 ENCOUNTER — Ambulatory Visit: Payer: Medicare Other

## 2021-12-30 ENCOUNTER — Encounter: Payer: Self-pay | Admitting: Adult Health

## 2021-12-30 ENCOUNTER — Ambulatory Visit (INDEPENDENT_AMBULATORY_CARE_PROVIDER_SITE_OTHER): Payer: Medicare Other | Admitting: Adult Health

## 2021-12-30 VITALS — BP 140/66 | HR 96 | Temp 98.2°F | Ht 62.0 in | Wt 240.8 lb

## 2021-12-30 DIAGNOSIS — G4733 Obstructive sleep apnea (adult) (pediatric): Secondary | ICD-10-CM | POA: Diagnosis not present

## 2021-12-30 DIAGNOSIS — G4710 Hypersomnia, unspecified: Secondary | ICD-10-CM | POA: Insufficient documentation

## 2021-12-30 NOTE — Patient Instructions (Addendum)
CPAP download  Order for new CPAP machine , same settings.  Continue on CPAP at bedtime.  Goal is to wear for more than 6 hours each night Work on healthy weight loss  Healthy sleep regimen  Do not drive if sleepy  Follow up in 3-4 months and .As needed

## 2021-12-30 NOTE — Assessment & Plan Note (Signed)
Despite excellent compliance on CPAP.  Patient continues to have some ongoing daytime sleepiness.  Suspect is related to multiple medications with sedating side effects.  We discussed a multi sleep latency test to rule out possible narcolepsy however patient does not want a proceed with this and does not feel she would be able to take any type of stimulant type medicine due to her severe depression, bipolar disorder.  Healthy sleep regimen discussed.  We will continue on CPAP.  Hold on multi sleep latency test at this time.

## 2021-12-30 NOTE — Assessment & Plan Note (Signed)
Healthy weight loss discussed 

## 2021-12-30 NOTE — Progress Notes (Signed)
@Patient  ID: , female    DOB: 04/22/1971, 51 y.o.   MRN: [id number]  Chief Complaint  Patient presents with   Consult    Referring provider: [id number], MD  HPI: 51 year old female presents for sleep consult December 30, 2021 to establish for OSA  Medical history significant for asthma, diabetes, bipolar disorder, schizoaffective disorder, OCD, GERD  TEST/EVENTS :   12/30/2021 Sleep consult  Patient presents for sleep consult today to establish for OSA.  Kindly referred by her primary care provider 03/01/2022, NP.  Here to establish for sleep apnea. Has been on CPAP for >12 yrs. Says she can not sleep without CPAP.  She uses her CPAP every single night. Needs a new CPAP .  Previous CPAP download shows excellent compliance with 100% usage, patient has a 3G machine and no longer does auto downloads.  Typical usage was at 8 hours.  She is on CPAP 10 cm H2O.  AHI 1.7/hour. She says her machine is getting older and has a message that the battery life is has exceeded life expectancy. Caffeine 5 cups coffee, 2 cups of tea, 2 sodas.  Patient has a complex medical history including asthma, diabetes, bipolar disorder, schizoaffective disorder, OCD and GERD. Patient is on multiple sedating medications including Depakote, Neurontin, Zyprexa, Viibryd.  Use [employer] [facility].  Does still have daytime sleepiness and low energy but better on CPAP . Feels she benefits from CPAP . Wears CPAP with naps. Uses nasal pillows . She is disabled.  Typically goes to bed about 10 to 11 PM.  Takes about half an hour to go to sleep.  Is up 2 or 3 times at night.  Typically gets up about 10 AM. Epworth score is 14 out of 24.  Gets sleepy if she sits down to watch TV or is sitting still..   Social history patient is married.  She is disabled.  Has 2 teenage children.  Lives with her husband.  She is a never smoker.  No alcohol or drug use.  Family history positive for lupus and diabetes  Surgical  history positive for cholecystectomy.  Allergies  Allergen Reactions   Abilify [Aripiprazole] Hives    Immunization History  Administered Date(s) Administered   Influenza,inj,Quad PF,6+ Mos 01/16/2016, 01/13/2017, 01/13/2018, 01/07/2021, 12/16/2021   Influenza-Unspecified 02/07/2015   PNEUMOCOCCAL CONJUGATE-20 05/21/2021   Pneumococcal Polysaccharide-23 04/29/2015   Tdap 01/23/2015    Past Medical History:  Diagnosis Date   Allergic rhinitis    Anxiety    Asthma    Bipolar disorder (HCC)    Depression    Diabetes mellitus without complication (HCC)    GERD (gastroesophageal reflux disease)    Hyperlipidemia    Hypertension    Mild sleep apnea    OCD (obsessive compulsive disorder)    Palpitations    Paranoia (HCC)    Schizoaffective disorder (HCC)    Sleep apnea     Tobacco History: Social History   Tobacco Use  Smoking Status Never  Smokeless Tobacco Never   Counseling given: Not Answered   Outpatient Medications Prior to Visit  Medication Sig Dispense Refill   ACCU-CHEK GUIDE test strip USE AS DIRECTED TO TEST TWICE DAILY 100 strip 2   albuterol (VENTOLIN HFA) 108 (90 Base) MCG/ACT inhaler INHALE 2 PUFFS INTO THE LUNGS EVERY 6 HOURS AS NEEDED FOR WHEEZING OR SHORTNESS OF BREATH 25.5 g 3   atorvastatin (LIPITOR) 40 MG tablet Take 1 tablet (40 mg total) by mouth daily. 90  tablet 1   benzonatate (TESSALON) 200 MG capsule Take 1 capsule (200 mg total) by mouth 3 (three) times daily as needed for cough. 30 capsule 0   Continuous Blood Gluc Sensor (DEXCOM G6 SENSOR) MISC Apply 1 sensor every 10 days 9 each 0   Continuous Blood Gluc Transmit (DEXCOM G6 TRANSMITTER) MISC Use to check blood sugar. DX E11.9 1 each 0   divalproex (DEPAKOTE ER) 500 MG 24 hr tablet Take 1 tablet (500 mg total) by mouth daily. 90 tablet 0   EPINEPHrine 0.3 mg/0.3 mL IJ SOAJ injection Inject 0.3 mg into the muscle as needed for anaphylaxis. 1 each 0   ezetimibe (ZETIA) 10 MG tablet TAKE 1  TABLET(10 MG) BY MOUTH DAILY 90 tablet 1   fluticasone (FLOVENT HFA) 110 MCG/ACT inhaler Inhale 1 puff into the lungs 2 (two) times daily. 1 Inhaler 11   hydrochlorothiazide (HYDRODIURIL) 12.5 MG tablet TAKE 1 TABLET(12.5 MG) BY MOUTH DAILY 90 tablet 1   ibuprofen (ADVIL) 200 MG tablet Take 200 mg by mouth every 6 (six) hours as needed.     insulin degludec (TRESIBA FLEXTOUCH) 100 UNIT/ML FlexTouch Pen ADMINISTER 40 UNITS UNDER THE SKIN EVERY DAY 15 mL 2   Insulin Pen Needle (BD PEN NEEDLE NANO U/F) 32G X 4 MM MISC 1 each by Does not apply route daily. 100 each 2   INVEGA SUSTENNA 234 MG/1.5ML injection ADMINISTER 1.5 ML IN THE MUSCLE EVERY 28 DAYS 1.5 mL 11   MELATONIN PO Take 5 mg by mouth at bedtime as needed.     metFORMIN (GLUCOPHAGE) 500 MG tablet TAKE 2 TABLETS(1000 MG) BY MOUTH TWICE DAILY WITH A MEAL 360 tablet 1   NOVOTWIST 32G X 5 MM MISC USE WITH LEVEMIR PEN BID  5   OLANZapine (ZYPREXA) 7.5 MG tablet Take 1 tablet (7.5 mg total) by mouth at bedtime. 90 tablet 0   omeprazole (PRILOSEC) 10 MG capsule TAKE 1 CAPSULE BY MOUTH DAILY 90 capsule 1   pioglitazone (ACTOS) 45 MG tablet TAKE 1 TABLET(45 MG) BY MOUTH DAILY 90 tablet 0   Vilazodone HCl (VIIBRYD) 40 MG TABS Take 1 tablet (40 mg total) by mouth daily. 90 tablet 1   gabapentin (NEURONTIN) 600 MG tablet Take 1 tablet (600 mg total) by mouth 3 (three) times daily. 270 tablet 0   No facility-administered medications prior to visit.     Review of Systems:   Constitutional:   No  weight loss, night sweats,  Fevers, chills, fatigue, or  lassitude.  HEENT:   No headaches,  Difficulty swallowing,  Tooth/dental problems, or  Sore throat,                No sneezing, itching, ear ache, nasal congestion, post nasal drip,   CV:  No chest pain,  Orthopnea, PND, swelling in lower extremities, anasarca, dizziness, palpitations, syncope.   GI  No heartburn, indigestion, abdominal pain, nausea, vomiting, diarrhea, change in bowel habits,  loss of appetite, bloody stools.   Resp: No shortness of breath with exertion or at rest.  No excess mucus, no productive cough,  No non-productive cough,  No coughing up of blood.  No change in color of mucus.  No wheezing.  No chest wall deformity  Skin: no rash or lesions.  GU: no dysuria, change in color of urine, no urgency or frequency.  No flank pain, no hematuria   MS:  No joint pain or swelling.  No decreased range of motion.  No back  pain.    Physical Exam  BP (!) 140/66 (BP Location: Right Arm, Patient Position: Sitting, Cuff Size: Large)   Pulse 96   Temp 98.2 F (36.8 C) (Oral)   Ht 5\' 2"  (1.575 m)   Wt 240 lb 12.8 oz (109.2 kg)   SpO2 96%   BMI 44.04 kg/m   GEN: A/Ox3; pleasant , NAD, well nourished    HEENT:  [place]/AT,   NOSE-clear, THROAT-clear, no lesions, no postnasal drip or exudate noted. Class 3-4 MP airway   NECK:  Supple w/ fair ROM; no JVD; normal carotid impulses w/o bruits; no thyromegaly or nodules palpated; no lymphadenopathy.    RESP  Clear  P & A; w/o, wheezes/ rales/ or rhonchi. no accessory muscle use, no dullness to percussion  CARD:  RRR, no m/r/g, no peripheral edema, pulses intact, no cyanosis or clubbing.  GI:   Soft & nt; nml bowel sounds; no organomegaly or masses detected.   Musco: Warm bil, no deformities or joint swelling noted.   Neuro: alert, no focal deficits noted.    Skin: Warm, no lesions or rashes    Lab Results:    BMET   BNP No results found for: "BNP"  ProBNP No results found for: "PROBNP"  Imaging: No results found.  paliperidone (INVEGA SUSTENNA) injection 234 mg     Date Action Dose Route User   11/28/2021 1107 Given 234 mg Intramuscular (Left Upper Outer Quadrant) 01/28/2022, CMA   11/03/2021 1302 Given 234 mg Intramuscular (Right Upper Outer Quadrant) Norton, Jessica L, CMA           No data to display          No results found for: "NITRICOXIDE"      Assessment & Plan:    Sleep apnea History of sleep apnea greater than 12 years.  We have contacted her homecare company and request a previous sleep study.  CPAP download was requested.  Patient's machine is getting older.  Patient feels that she benefits from CPAP and cannot sleep without it.  Order for new CPAP machine at 10 cm H2O.  - discussed how weight can impact sleep and risk for sleep disordered breathing - discussed options to assist with weight loss: combination of diet modification, cardiovascular and strength training exercises   - had an extensive discussion regarding the adverse health consequences related to untreated sleep disordered breathing - specifically discussed the risks for hypertension, coronary artery disease, cardiac dysrhythmias, cerebrovascular disease, and diabetes - lifestyle modification discussed   - discussed how sleep disruption can increase risk of accidents, particularly when driving - safe driving practices were discussed   Plan  Patient Instructions  CPAP download  Order for new CPAP machine , same settings.  Continue on CPAP at bedtime.  Goal is to wear for more than 6 hours each night Work on healthy weight loss  Healthy sleep regimen  Do not drive if sleepy  Follow up in 3-4 months and .As needed       Morbid obesity (HCC) Healthy weight loss discussed  Hypersomnia Despite excellent compliance on CPAP.  Patient continues to have some ongoing daytime sleepiness.  Suspect is related to multiple medications with sedating side effects.  We discussed a multi sleep latency test to rule out possible narcolepsy however patient does not want a proceed with this and does not feel she would be able to take any type of stimulant type medicine due to her severe depression, bipolar disorder.  Healthy sleep regimen discussed.  We will continue on CPAP.  Hold on multi sleep latency test at this time.   I spent   45 minutes dedicated to the care of this patient on the date of  this encounter to include pre-visit review of records, face-to-face time with the patient discussing conditions above, post visit ordering of testing, clinical documentation with the electronic health record, making appropriate referrals as documented, and communicating necessary findings to members of the patients care team.    Rubye Oaks, NP 12/30/2021

## 2021-12-30 NOTE — Assessment & Plan Note (Signed)
History of sleep apnea greater than 12 years.  We have contacted her homecare company and request a previous sleep study.  CPAP download was requested.  Patient's machine is getting older.  Patient feels that she benefits from CPAP and cannot sleep without it.  Order for new CPAP machine at 10 cm H2O.  - discussed how weight can impact sleep and risk for sleep disordered breathing - discussed options to assist with weight loss: combination of diet modification, cardiovascular and strength training exercises   - had an extensive discussion regarding the adverse health consequences related to untreated sleep disordered breathing - specifically discussed the risks for hypertension, coronary artery disease, cardiac dysrhythmias, cerebrovascular disease, and diabetes - lifestyle modification discussed   - discussed how sleep disruption can increase risk of accidents, particularly when driving - safe driving practices were discussed   Plan  Patient Instructions  CPAP download  Order for new CPAP machine , same settings.  Continue on CPAP at bedtime.  Goal is to wear for more than 6 hours each night Work on healthy weight loss  Healthy sleep regimen  Do not drive if sleepy  Follow up in 3-4 months and .As needed

## 2021-12-31 NOTE — Progress Notes (Signed)
Reviewed and agree with assessment/plan.   Saidah Kempton, MD [facility] 12/31/2021, 7:56 AM Pager:  [phone]  

## 2022-01-01 ENCOUNTER — Encounter: Payer: Self-pay | Admitting: Internal Medicine

## 2022-01-06 ENCOUNTER — Other Ambulatory Visit: Payer: Self-pay | Admitting: Psychiatry

## 2022-01-06 ENCOUNTER — Ambulatory Visit (INDEPENDENT_AMBULATORY_CARE_PROVIDER_SITE_OTHER): Payer: Medicare Other

## 2022-01-06 DIAGNOSIS — F3170 Bipolar disorder, currently in remission, most recent episode unspecified: Secondary | ICD-10-CM | POA: Diagnosis not present

## 2022-01-06 DIAGNOSIS — F422 Mixed obsessional thoughts and acts: Secondary | ICD-10-CM

## 2022-01-06 DIAGNOSIS — F251 Schizoaffective disorder, depressive type: Secondary | ICD-10-CM

## 2022-01-06 MED ORDER — PALIPERIDONE PALMITATE ER 234 MG/1.5ML IM SUSY
234.0000 mg | PREFILLED_SYRINGE | INTRAMUSCULAR | Status: AC
  Administered 2022-01-06 – 2022-06-04 (×5): 234 mg via INTRAMUSCULAR

## 2022-01-06 NOTE — Patient Instructions (Signed)
pt was given invega sustenna 234mg /1.13ml in the upper right out quatient.   pt appears clean and in a good mood.  she talked about her boys has gone back to school. but her and her husband both have been sick.  pt states she not has any thoughts or hallucations.  She feels she is doing find other than having a cough.     Invega sustenna 234mg /1.5 ml  NDC 4m Lot# [id number] EXP 06-26-2023  S/N# [id number]  GTTN# 08-26-2023.   Pt to return in 28 days for next injection.

## 2022-01-15 ENCOUNTER — Other Ambulatory Visit: Payer: Self-pay | Admitting: Psychiatry

## 2022-01-19 NOTE — Progress Notes (Unsigned)
BH MD/PA/NP OP Progress Note  01/22/2022 5:40 PM Tabitha [name]  MRN:  [id number]  Chief Complaint:  Chief Complaint  Patient presents with   Follow-up   Medication Refill   HPI:  This is a follow-up appointment for schizoaffective disorder.  She states that she has more anxiety lately as she needs to drive.  Although her husband used to go to places, he is unable to do so due to the charge.  He was charged as a sex offender, and is unable to be around with children.  She states that he had a charge of putting some video on website, although he did not know anything about it.  Although it causes stress on her, she denies any change in the relationship with him.  Although she occasionally feels that people are talking about her, she has been able to go to places. She has occasional AH of her family's voice. The patient has mood symptoms as in PHQ-9/GAD-7.  She denies SI.  She denies decreased need for sleep or euphonia.  She reports increase in appetite.  She denies alcohol use or drug use.  She had menopause.  She denies any galactorrhea lately.  She feels comfortable to stay on the medication as it is at this time.   Daily routine: house chores, goes to church weekly. Choir on Wed,  Exercise: Employment: unemployed, on disability due to paranoia (could not keep her job due to paranoia that people coming to her workplace). used to work at [employer] until 2007 (quit due to pregnancy) Support: husband, son, parents Household: husband, 2 sons (age 28,15) Marital status: married Number of children: 2 sons Education: college, majored in Engineer, site, music  Wt Readings from Last 3 Encounters:  01/22/22 242 lb (109.8 kg)  01/06/22 241 lb (109.3 kg)  12/30/21 240 lb 12.8 oz (109.2 kg)     Visit Diagnosis:    ICD-10-CM   1. Obsessive-compulsive disorder, unspecified type  F42.9     2. Schizoaffective disorder, depressive type ([place])  F25.1     3. Anxiety state  F41.1       Past Psychiatric  History:  Please see initial evaluation for full details. I have reviewed the history. No updates at this time.     Past Medical History:  Past Medical History:  Diagnosis Date   Allergic rhinitis    Anxiety    Asthma    Bipolar disorder ([place])    Depression    Diabetes mellitus without complication (HCC)    GERD (gastroesophageal reflux disease)    Hyperlipidemia    Hypertension    Mild sleep apnea    OCD (obsessive compulsive disorder)    Palpitations    Paranoia ([place])    Schizoaffective disorder ([place])    Sleep apnea     Past Surgical History:  Procedure Laterality Date   CESAREAN SECTION  2006/2008   CHOLECYSTECTOMY  2006    Family Psychiatric History: Please see initial evaluation for full details. I have reviewed the history. No updates at this time.     Family History:  Family History  Problem Relation Age of Onset   Lupus Mother    Diabetes Mother        pre diabetes   Diabetes Father    Hyperlipidemia Father    Diabetes Brother    Depression Paternal Grandmother    Bipolar disorder Paternal Grandmother     Social History:  Social History   Socioeconomic History   Marital status: Married  Spouse name: [name] CallerKevin Rostad   Number of children: 2   Years of education: 16   Highest education level: Bachelor's degree (e.g., BA, AB, BS)  Occupational History   Occupation: disability  Tobacco Use   Smoking status: Never   Smokeless tobacco: Never  Vaping Use   Vaping Use: Never used  Substance and Sexual Activity   Alcohol use: Not Currently   Drug use: Never   Sexual activity: Yes    Partners: Male    Birth control/protection: Surgical  Other Topics Concern   Not on file  Social History Narrative   Not on file   Social Determinants of Health   Financial Resource Strain: Low Risk  (09/11/2021)   Overall Financial Resource Strain (CARDIA)    Difficulty of Paying Living Expenses: Not hard at all  Food Insecurity: No Food Insecurity (09/11/2021)    Hunger Vital Sign    Worried About Running Out of Food in the Last Year: Never true    Ran Out of Food in the Last Year: Never true  Transportation Needs: No Transportation Needs (09/11/2021)   PRAPARE - Administrator, Civil ServiceTransportation    Lack of Transportation (Medical): No    Lack of Transportation (Non-Medical): No  Physical Activity: Inactive (09/11/2021)   Exercise Vital Sign    Days of Exercise per Week: 0 days    Minutes of Exercise per Session: 0 min  Stress: Stress Concern Present (09/11/2021)   [employer] - Occupational Stress Questionnaire    Feeling of Stress : Rather much  Social Connections: Socially Integrated (09/11/2021)   Social Connection and Isolation Panel [NHANES]    Frequency of Communication with Friends and Family: Once a week    Frequency of Social Gatherings with Friends and Family: More than three times a week    Attends Religious Services: More than 4 times per year    Active Member of [employer]ubs or Organizations: Yes    Attends Engineer, structuralClub or Organization Meetings: More than 4 times per year    Marital Status: Married    Allergies:  Allergies  Allergen Reactions   Abilify [Aripiprazole] Hives    Metabolic Disorder Labs: Lab Results  Component Value Date   HGBA1C 8.5 (A) 12/16/2021   MPG 171 05/21/2021   MPG 166 01/08/2021   Lab Results  Component Value Date   PROLACTIN 84.8 (H) 06/01/2017   PROLACTIN 36.2 (H) 03/26/2016   Lab Results  Component Value Date   CHOL 167 12/17/2021   TRIG 445 (H) 12/17/2021   HDL 49 (L) 12/17/2021   CHOLHDL 3.4 12/17/2021   VLDL 51 (H) 04/23/2017   LDLCALC  12/17/2021     Comment:     . LDL cholesterol not calculated. Triglyceride levels greater than 400 mg/dL invalidate calculated LDL results. . Reference range: <100 . Desirable range <100 mg/dL for primary prevention;   <70 mg/dL for patients with CHD or diabetic patients  with > or = 2 CHD risk factors. Marland Kitchen. LDL-C is now calculated using the  Martin-Hopkins  calculation, which is a validated novel method providing  better accuracy than the Friedewald equation in the  estimation of LDL-C.  Horald PollenMartin SS et al. Lenox AhrJAMA. 1308;657(842013;310(19): 2061-2068  (http://education.QuestDiagnostics.com/faq/FAQ164)    LDLCALC 59 05/21/2021   Lab Results  Component Value Date   TSH 1.026 01/22/2022   TSH 1.56 12/17/2021    Therapeutic Level Labs: No results found for: "LITHIUM" Lab Results  Component Value Date   VALPROATE 46 (L) 01/22/2022  VALPROATE 46.0 (L) 12/17/2021   No results found for: "CBMZ"  Current Medications: Current Outpatient Medications  Medication Sig Dispense Refill   ACCU-CHEK GUIDE test strip USE AS DIRECTED TO TEST TWICE DAILY 100 strip 2   albuterol (VENTOLIN HFA) 108 (90 Base) MCG/ACT inhaler INHALE 2 PUFFS INTO THE LUNGS EVERY 6 HOURS AS NEEDED FOR WHEEZING OR SHORTNESS OF BREATH 25.5 g 3   atorvastatin (LIPITOR) 40 MG tablet Take 1 tablet (40 mg total) by mouth daily. 90 tablet 1   benzonatate (TESSALON) 200 MG capsule Take 1 capsule (200 mg total) by mouth 3 (three) times daily as needed for cough. 30 capsule 0   Continuous Blood Gluc Sensor (DEXCOM G6 SENSOR) MISC Apply 1 sensor every 10 days 9 each 0   Continuous Blood Gluc Transmit (DEXCOM G6 TRANSMITTER) MISC Use to check blood sugar. DX E11.9 1 each 0   divalproex (DEPAKOTE ER) 500 MG 24 hr tablet TAKE 1 TABLET(500 MG) BY MOUTH DAILY 90 tablet 0   EPINEPHrine 0.3 mg/0.3 mL IJ SOAJ injection Inject 0.3 mg into the muscle as needed for anaphylaxis. 1 each 0   ezetimibe (ZETIA) 10 MG tablet TAKE 1 TABLET(10 MG) BY MOUTH DAILY 90 tablet 1   fluticasone (FLOVENT HFA) 110 MCG/ACT inhaler Inhale 1 puff into the lungs 2 (two) times daily. 1 Inhaler 11   hydrochlorothiazide (HYDRODIURIL) 12.5 MG tablet TAKE 1 TABLET(12.5 MG) BY MOUTH DAILY 90 tablet 1   ibuprofen (ADVIL) 200 MG tablet Take 200 mg by mouth every 6 (six) hours as needed.     insulin degludec (TRESIBA  FLEXTOUCH) 100 UNIT/ML FlexTouch Pen ADMINISTER 40 UNITS UNDER THE SKIN EVERY DAY 15 mL 2   Insulin Pen Needle (BD PEN NEEDLE NANO U/F) 32G X 4 MM MISC 1 each by Does not apply route daily. 100 each 2   INVEGA SUSTENNA 234 MG/1.5ML injection ADMINISTER 1.5 ML IN THE MUSCLE EVERY 28 DAYS 1.5 mL 11   MELATONIN PO Take 5 mg by mouth at bedtime as needed.     metFORMIN (GLUCOPHAGE) 500 MG tablet TAKE 2 TABLETS(1000 MG) BY MOUTH TWICE DAILY WITH A MEAL 360 tablet 1   NOVOTWIST 32G X 5 MM MISC USE WITH LEVEMIR PEN BID  5   omeprazole (PRILOSEC) 10 MG capsule TAKE 1 CAPSULE BY MOUTH DAILY 90 capsule 1   pioglitazone (ACTOS) 45 MG tablet TAKE 1 TABLET(45 MG) BY MOUTH DAILY 90 tablet 0   Vilazodone HCl (VIIBRYD) 40 MG TABS Take 1 tablet (40 mg total) by mouth daily. 90 tablet 1   gabapentin (NEURONTIN) 600 MG tablet Take 1 tablet (600 mg total) by mouth 3 (three) times daily. 270 tablet 0   OLANZapine (ZYPREXA) 7.5 MG tablet Take 1 tablet (7.5 mg total) by mouth at bedtime. 90 tablet 0   Current Facility-Administered Medications  Medication Dose Route Frequency Provider Last Rate Last Admin   paliperidone (INVEGA SUSTENNA) injection 234 mg  234 mg Intramuscular Q28 days Norman Clay, MD   234 mg at 01/06/22 1258     Musculoskeletal: Strength & Muscle Tone: within normal limits Gait & Station: normal Patient leans: N/A  Psychiatric Specialty Exam: Review of Systems  Psychiatric/Behavioral:  Positive for dysphoric mood and hallucinations. Negative for agitation, behavioral problems, confusion and decreased concentration.   All other systems reviewed and are negative.   Blood pressure 132/80, pulse 85, temperature 97.9 F (36.6 C), temperature source Temporal, height 5\' 2"  (1.575 m), weight 242 lb (109.8 kg).Body mass  index is 44.26 kg/m.  General Appearance: Fairly Groomed  Eye Contact:  Good  Speech:  Clear and Coherent  Volume:  Normal  Mood:  Anxious  Affect:  Appropriate, Congruent, and  calm  Thought Process:  Coherent  Orientation:  Full (Time, Place, and Person)  Thought Content: Logical   Suicidal Thoughts:  No  Homicidal Thoughts:  No  Memory:  Immediate;   Good  Judgement:  Good  Insight:  Good  Psychomotor Activity:  Normal  Concentration:  Concentration: Good and Attention Span: Good  Recall:  Good  Fund of Knowledge: Good  Language: Good  Akathisia:  No  Handed:  Right  AIMS (if indicated): not done  Assets:  Communication Skills Desire for Improvement  ADL's:  Intact  Cognition: WNL  Sleep:  Fair   Screenings: [place] Office Visit from 05/12/2017 in [place] Office Visit from 07/29/2016 in [place] Office Visit from 05/06/2016 in [place] Office Visit from 04/01/2016 in [place] Office Visit from 03/28/2015 in [place] Total Score 0 0 0 0 0      [place] Office Visit from 01/22/2022 in [place] Office Visit from 08/28/2021 in [facility] Office Visit from 05/21/2021 in [facility] Office Visit from 01/07/2021 in [facility] Office Visit from 04/28/2018 in [place]  Total GAD-7 Score 7 4 14 7 8       [place] Office Visit from 09/07/2019 in [facility]  Total Score (max 30 points ) 16      PHQ2-9    [place] Visit from 01/22/2022 in [place] Office Visit from 09/08/2021 in [place] Office Visit from 08/28/2021 in [facility] Office Visit from 07/03/2021 in [place] Office Visit from 05/21/2021 in [facility]  PHQ-2 Total Score 2 3 2 2 4   PHQ-9 Total Score 7 19 11 13 13       [place] Office Visit from  01/22/2022 in [place] Office Visit from 09/08/2021 in [facility] Office Visit from 07/03/2021 in [place] No Risk Error: Q3, 4, or 5 should not be populated when Q2 is No No Risk        Assessment and Plan:  JELISHA RETALLICK is a 51 y.o. year old female with a history of schizoaffective disorder,  sleep apnea (using CPAP machine), diabetes, hypertension, hyperlipidemia, GERD, who presents for follow up appointment for below.   1. Schizoaffective disorder, depressive type ([place]) 2. Obsessive-compulsive disorder, unspecified type 3. Anxiety state There has been overall improvement in depressive symptoms and anxiety since improvement in medication adherence. Recent psychosocial stressors includes her husband being registered as a sex offender, although she does not think it occurred.  Will continue Invega IM, olanzapine to target schizoaffective disorder.  She was reminded again to obtain blot test.  Noted that she had residual hallucinations with Invega injection only, and has benefit from 2 antipsychotics.  Will not try Clozapine due to his significant metabolic side effect/lack of injection.  Will continue Depakote to target schizoaffective disorder.  Will continue Viibryd to target depression and anxiety.  Will continue gabapentin for anxiety.    # Fatigue Unchanged. She reports fatigue, hypersomnia.  She was referred for evaluation of sleep apnea.  She is reminded again to obtain blood test.    # r/o high prolactin She reports improvement in galactorrhea.  She was reminded again to obtain prolactin level.    Plan (she will contact the office if she needs a refill) Continue monthly invega 234 mg IM - monitor weight gain. She is on metformin.  Continue olanzapine 7.5 mg at night - monitor weight gain Continue Depakote 500 mg daily (VPA 46 11/2021, LFT wnl 09/2021, Plt wnl  11/2021) Continue Viibryd 40 mg daily Continue gabapentin 600 mg 3 times a day Obtain labs (prolactin) Next appointment- 11/14 at 8:30, in person - She will see [facility] PCP at [facility]   Past trials of medication: Abilify (hives), olanzapine, quetiapine, risperidone (stopped working),         Ambulance person of Care: Collaboration of Care: Other reviewed notes in Epic  Patient/Guardian was advised Release of Information must be obtained prior to any record release in order to collaborate their care with an outside provider. Patient/Guardian was advised if they have not already done so to contact the registration department to sign all necessary forms in order for [place] to release information regarding their care.   Consent: Patient/Guardian gives verbal consent for treatment and assignment of benefits for services provided during this visit. Patient/Guardian expressed understanding and agreed to proceed.    Norman Clay, MD 01/22/2022, 5:40 PM

## 2022-01-22 ENCOUNTER — Ambulatory Visit (INDEPENDENT_AMBULATORY_CARE_PROVIDER_SITE_OTHER): Payer: Medicare Other | Admitting: Psychiatry

## 2022-01-22 ENCOUNTER — Other Ambulatory Visit
Admission: RE | Admit: 2022-01-22 | Discharge: 2022-01-22 | Disposition: A | Discharge status: Home / Self Care | Payer: Medicare Other | Source: Ambulatory Visit | Attending: Psychiatry | Admitting: Psychiatry

## 2022-01-22 ENCOUNTER — Encounter: Payer: Self-pay | Admitting: Psychiatry

## 2022-01-22 VITALS — BP 132/80 | HR 85 | Temp 97.9°F | Ht 62.0 in | Wt 242.0 lb

## 2022-01-22 DIAGNOSIS — F251 Schizoaffective disorder, depressive type: Secondary | ICD-10-CM | POA: Diagnosis not present

## 2022-01-22 DIAGNOSIS — F419 Anxiety disorder, unspecified: Secondary | ICD-10-CM | POA: Diagnosis not present

## 2022-01-22 DIAGNOSIS — Z5181 Encounter for therapeutic drug level monitoring: Secondary | ICD-10-CM | POA: Diagnosis present

## 2022-01-22 DIAGNOSIS — F411 Generalized anxiety disorder: Secondary | ICD-10-CM

## 2022-01-22 DIAGNOSIS — F32A Depression, unspecified: Secondary | ICD-10-CM | POA: Insufficient documentation

## 2022-01-22 DIAGNOSIS — F429 Obsessive-compulsive disorder, unspecified: Secondary | ICD-10-CM

## 2022-01-22 LAB — VALPROIC ACID LEVEL: Valproic Acid Lvl: 46 ug/mL — ABNORMAL LOW (ref 50.0–100.0)

## 2022-01-22 LAB — TSH: TSH: 1.026 u[IU]/mL (ref 0.350–4.500)

## 2022-01-22 MED ORDER — OLANZAPINE 7.5 MG PO TABS: 7.5000 mg | ORAL_TABLET | Freq: Every day | ORAL | 0 refills | Status: DC

## 2022-01-23 ENCOUNTER — Encounter: Payer: Self-pay | Admitting: Psychiatry

## 2022-01-23 LAB — PROLACTIN: Prolactin: 37.8 ng/mL — ABNORMAL HIGH (ref 4.8–23.3)

## 2022-01-23 NOTE — Progress Notes (Signed)
Letter was written regarding her blood test. Could you mail it to her? Thanks.

## 2022-02-14 ENCOUNTER — Other Ambulatory Visit: Payer: Self-pay | Admitting: Internal Medicine

## 2022-02-16 NOTE — Telephone Encounter (Signed)
Requested Prescriptions  Pending Prescriptions Disp Refills  . hydrochlorothiazide (HYDRODIURIL) 12.5 MG tablet [Pharmacy Med Name: HYDROCHLOROTHIAZIDE 12.5MG  TABLETS] 90 tablet 1    Sig: TAKE 1 TABLET(12.5 MG) BY MOUTH DAILY     Cardiovascular: Diuretics - Thiazide Passed - 02/14/2022  6:59 AM      Passed - Cr in normal range and within 180 days    Creat  Date Value Ref Range Status  12/17/2021 0.71 0.50 - 0.99 mg/dL Final   Creatinine, Urine  Date Value Ref Range Status  12/17/2021 52 20 - 275 mg/dL Final         Passed - K in normal range and within 180 days    Potassium  Date Value Ref Range Status  12/17/2021 3.7 3.5 - 5.3 mmol/L Final         Passed - Na in normal range and within 180 days    Sodium  Date Value Ref Range Status  12/17/2021 136 135 - 146 mmol/L Final  09/07/2019 137 134 - 144 mmol/L Final         Passed - Last BP in normal range    BP Readings from Last 1 Encounters:  12/30/21 (!) 140/66         Passed - Valid encounter within last 6 months    Recent Outpatient Visits          2 months ago Type 2 diabetes mellitus with diabetic polyneuropathy, with long-term current use of insulin ([place])   [facility] [place], Coralie Keens, NP   5 months ago Acute cystitis without hematuria   [place], NP   9 months ago Encounter for general adult medical examination with abnormal findings   [facility] [place], Coralie Keens, NP   1 year ago Influenza vaccine needed   [facility] [place], Coralie Keens, NP   1 year ago [facility] [place], Coralie Keens, NP      Future Appointments            In 1 month [place], Coralie Keens, NP [facility], [facility]

## 2022-02-17 DIAGNOSIS — G4733 Obstructive sleep apnea (adult) (pediatric): Secondary | ICD-10-CM | POA: Diagnosis not present

## 2022-02-23 ENCOUNTER — Ambulatory Visit (INDEPENDENT_AMBULATORY_CARE_PROVIDER_SITE_OTHER): Payer: Medicare Other

## 2022-02-23 DIAGNOSIS — F3170 Bipolar disorder, currently in remission, most recent episode unspecified: Secondary | ICD-10-CM

## 2022-02-23 DIAGNOSIS — F251 Schizoaffective disorder, depressive type: Secondary | ICD-10-CM | POA: Diagnosis not present

## 2022-02-23 NOTE — Patient Instructions (Addendum)
pt was given invega sustenna 234mg /1.82ml in the upper left out quatient.   pt appears clean and in a good mood.  Patient stated that she felt fine was in good spirits no pain tolerated injection well.  Invega sustenna 154 mg/1.5 ml NDC [id number] LOT #[id number] EXP 08/25/2023 DXIP#[id number] INJECTION SITE upper left quat

## 2022-03-05 NOTE — Progress Notes (Signed)
BH MD/PA/NP OP Progress Note  03/10/2022 9:09 AM [name]  MRN:  [id number]  Chief Complaint:  Chief Complaint  Patient presents with   Follow-up   HPI:  This is a follow-up appointment for schizoaffective disorder.  She states that she has been feeling stressed and anxious.  She has been bringing her case to school as her husband is unable to go there.  She feels overwhelmed, and it has been difficult for her to feel relaxed.  She continues to go to church.  She feels uneasy at times that she feels others are talking negatively about her.  She does not destroy think they are doing it, and agrees that it is more of her concern.  She states that her kids are doing good.  They tend to stay in their room.  She enjoys having meals with them most of the time. The patient has mood symptoms as in PHQ-9/GAD-7. She has insomnia due to her husband being sick. She tends to eat more lately, She denies SI.  She has AH of her family's voice.  She denies CAH.  She has VH of seeing bugs on stove; she feels bother it at times.  She denies alcohol use, drug use or cigarette use.  She feels comfortable to stay on the medication as it is at this time.   Wt Readings from Last 3 Encounters:  03/10/22 246 lb 12.8 oz (111.9 kg)  02/23/22 241 lb 6.4 oz (109.5 kg)  01/22/22 242 lb (109.8 kg)     Daily routine: house chores, goes to church weekly. Choir on Wed,  Exercise: Employment: unemployed, on disability due to paranoia (could not keep her job due to paranoia that people coming to her workplace). used to work at [employer] until 2007 (quit due to pregnancy) Support: husband, son, parents Household: husband, 2 sons (age 50,15) Marital status: married Number of children: 2 sons Education: college, majored in Psychologist, educationalart, music  Wt Readings from Last 3 Encounters:  03/10/22 246 lb 12.8 oz (111.9 kg)  02/23/22 241 lb 6.4 oz (109.5 kg)  01/22/22 242 lb (109.8 kg)    Visit Diagnosis:    ICD-10-CM   1.  Schizoaffective disorder, depressive type (HCC)  F25.1 EKG 12-Lead    Valproic acid level    Comprehensive metabolic panel    CBC    2. Obsessive-compulsive disorder, unspecified type  F42.9     3. Anxiety state  F41.1       Past Psychiatric History: Please see initial evaluation for full details. I have reviewed the history. No updates at this time.     Past Medical History:  Past Medical History:  Diagnosis Date   Allergic rhinitis    Anxiety    Asthma    Bipolar disorder (HCC)    Depression    Diabetes mellitus without complication (HCC)    GERD (gastroesophageal reflux disease)    Hyperlipidemia    Hypertension    Mild sleep apnea    OCD (obsessive compulsive disorder)    Palpitations    Paranoia (HCC)    Schizoaffective disorder (HCC)    Sleep apnea     Past Surgical History:  Procedure Laterality Date   CESAREAN SECTION  2006/2008   CHOLECYSTECTOMY  2006    Family Psychiatric History: Please see initial evaluation for full details. I have reviewed the history. No updates at this time.     Family History:  Family History  Problem Relation Age of Onset   Lupus  Mother    Diabetes Mother        pre diabetes   Diabetes Father    Hyperlipidemia Father    Diabetes Brother    Depression Paternal Grandmother    Bipolar disorder Paternal Grandmother     Social History:  Social History   Socioeconomic History   Marital status: Married    Spouse name: Decie Verne   Number of children: 2   Years of education: 16   Highest education level: Bachelor's degree (e.g., BA, AB, BS)  Occupational History   Occupation: disability  Tobacco Use   Smoking status: Never   Smokeless tobacco: Never  Vaping Use   Vaping Use: Never used  Substance and Sexual Activity   Alcohol use: Not Currently   Drug use: Never   Sexual activity: Yes    Partners: Male    Birth control/protection: Surgical  Other Topics Concern   Not on file  Social History Narrative   Not on  file   Social Determinants of Health   Financial Resource Strain: Low Risk  (09/11/2021)   Overall Financial Resource Strain (CARDIA)    Difficulty of Paying Living Expenses: Not hard at all  Food Insecurity: No Food Insecurity (09/11/2021)   Hunger Vital Sign    Worried About Running Out of Food in the Last Year: Never true    Ran Out of Food in the Last Year: Never true  Transportation Needs: No Transportation Needs (09/11/2021)   PRAPARE - Administrator, Civil Service (Medical): No    Lack of Transportation (Non-Medical): No  Physical Activity: Inactive (09/11/2021)   Exercise Vital Sign    Days of Exercise per Week: 0 days    Minutes of Exercise per Session: 0 min  Stress: Stress Concern Present (09/11/2021)   [employer] of Occupational Health - Occupational Stress Questionnaire    Feeling of Stress : Rather much  Social Connections: Socially Integrated (09/11/2021)   Social Connection and Isolation Panel [NHANES]    Frequency of Communication with Friends and Family: Once a week    Frequency of Social Gatherings with Friends and Family: More than three times a week    Attends Religious Services: More than 4 times per year    Active Member of [employer] or Organizations: Yes    Attends Engineer, structural: More than 4 times per year    Marital Status: Married    Allergies:  Allergies  Allergen Reactions   Abilify [Aripiprazole] Hives    Metabolic Disorder Labs: Lab Results  Component Value Date   HGBA1C 8.5 (A) 12/16/2021   MPG 171 05/21/2021   MPG 166 01/08/2021   Lab Results  Component Value Date   PROLACTIN 37.8 (H) 01/22/2022   PROLACTIN 84.8 (H) 06/01/2017   Lab Results  Component Value Date   CHOL 167 12/17/2021   TRIG 445 (H) 12/17/2021   HDL 49 (L) 12/17/2021   CHOLHDL 3.4 12/17/2021   VLDL 51 (H) 04/23/2017   LDLCALC  12/17/2021     Comment:     . LDL cholesterol not calculated. Triglyceride levels greater than 400 mg/dL  invalidate calculated LDL results. . Reference range: <100 . Desirable range <100 mg/dL for primary prevention;   <70 mg/dL for patients with CHD or diabetic patients  with > or = 2 CHD risk factors. Marland Kitchen LDL-C is now calculated using the Martin-Hopkins  calculation, which is a validated novel method providing  better accuracy than the Friedewald equation in the  estimation of LDL-C.  Horald Pollen et al. Lenox Ahr. 1751;025(85): 2061-2068  (http://education.[employer].com/faq/FAQ164)    LDLCALC 59 05/21/2021   Lab Results  Component Value Date   TSH 1.026 01/22/2022   TSH 1.56 12/17/2021    Therapeutic Level Labs: No results found for: "LITHIUM" Lab Results  Component Value Date   VALPROATE 46 (L) 01/22/2022   VALPROATE 46.0 (L) 12/17/2021   No results found for: "CBMZ"  Current Medications: Current Outpatient Medications  Medication Sig Dispense Refill   ACCU-CHEK GUIDE test strip USE AS DIRECTED TO TEST TWICE DAILY 100 strip 2   albuterol (VENTOLIN HFA) 108 (90 Base) MCG/ACT inhaler INHALE 2 PUFFS INTO THE LUNGS EVERY 6 HOURS AS NEEDED FOR WHEEZING OR SHORTNESS OF BREATH 25.5 g 3   atorvastatin (LIPITOR) 40 MG tablet Take 1 tablet (40 mg total) by mouth daily. 90 tablet 1   benzonatate (TESSALON) 200 MG capsule Take 1 capsule (200 mg total) by mouth 3 (three) times daily as needed for cough. 30 capsule 0   Continuous Blood Gluc Sensor (DEXCOM G6 SENSOR) MISC Apply 1 sensor every 10 days 9 each 0   Continuous Blood Gluc Transmit (DEXCOM G6 TRANSMITTER) MISC Use to check blood sugar. DX E11.9 1 each 0   divalproex (DEPAKOTE ER) 500 MG 24 hr tablet TAKE 1 TABLET(500 MG) BY MOUTH DAILY 90 tablet 0   EPINEPHrine 0.3 mg/0.3 mL IJ SOAJ injection Inject 0.3 mg into the muscle as needed for anaphylaxis. 1 each 0   ezetimibe (ZETIA) 10 MG tablet TAKE 1 TABLET(10 MG) BY MOUTH DAILY 90 tablet 1   fluticasone (FLOVENT HFA) 110 MCG/ACT inhaler Inhale 1 puff into the lungs 2 (two) times  daily. 1 Inhaler 11   hydrochlorothiazide (HYDRODIURIL) 12.5 MG tablet TAKE 1 TABLET(12.5 MG) BY MOUTH DAILY 90 tablet 1   ibuprofen (ADVIL) 200 MG tablet Take 200 mg by mouth every 6 (six) hours as needed.     insulin degludec (TRESIBA FLEXTOUCH) 100 UNIT/ML FlexTouch Pen ADMINISTER 40 UNITS UNDER THE SKIN EVERY DAY 15 mL 2   Insulin Pen Needle (BD PEN NEEDLE NANO U/F) 32G X 4 MM MISC 1 each by Does not apply route daily. 100 each 2   INVEGA SUSTENNA 234 MG/1.5ML injection ADMINISTER 1.5 ML IN THE MUSCLE EVERY 28 DAYS 1.5 mL 11   MELATONIN PO Take 5 mg by mouth at bedtime as needed.     metFORMIN (GLUCOPHAGE) 500 MG tablet TAKE 2 TABLETS(1000 MG) BY MOUTH TWICE DAILY WITH A MEAL 360 tablet 1   NOVOTWIST 32G X 5 MM MISC USE WITH LEVEMIR PEN BID  5   OLANZapine (ZYPREXA) 7.5 MG tablet Take 1 tablet (7.5 mg total) by mouth at bedtime. 90 tablet 0   omeprazole (PRILOSEC) 10 MG capsule TAKE 1 CAPSULE BY MOUTH DAILY 90 capsule 1   pioglitazone (ACTOS) 45 MG tablet TAKE 1 TABLET(45 MG) BY MOUTH DAILY 90 tablet 0   Vilazodone HCl (VIIBRYD) 40 MG TABS Take 1 tablet (40 mg total) by mouth daily. 90 tablet 1   gabapentin (NEURONTIN) 600 MG tablet Take 1 tablet (600 mg total) by mouth 3 (three) times daily. 270 tablet 0   Current Facility-Administered Medications  Medication Dose Route Frequency Provider Last Rate Last Admin   paliperidone (INVEGA SUSTENNA) injection 234 mg  234 mg Intramuscular Q28 days [name], MD   234 mg at 02/23/22 1119     Musculoskeletal: Strength & Muscle Tone: within normal limits Gait & Station: normal Patient  leans: N/A  Psychiatric Specialty Exam: Review of Systems  Psychiatric/Behavioral:  Positive for decreased concentration, dysphoric mood and sleep disturbance. Negative for agitation, behavioral problems, confusion, hallucinations, self-injury and suicidal ideas. The patient is nervous/anxious. The patient is not hyperactive.   All other systems reviewed and  are negative.   Blood pressure 124/72, pulse 81, temperature 97.6 F (36.4 C), temperature source Oral, height 5\' 2"  (1.575 m), weight 246 lb 12.8 oz (111.9 kg).Body mass index is 45.14 kg/m.  General Appearance: Fairly Groomed  Eye Contact:  Good  Speech:  Clear and Coherent  Volume:  Normal  Mood:   good  Affect:  Appropriate, Congruent, and calm  Thought Process:  Coherent  Orientation:  Full (Time, Place, and Person)  Thought Content: Logical   Suicidal Thoughts:  No  Homicidal Thoughts:  No  Memory:  Immediate;   Good  Judgement:  Good  Insight:  Good  Psychomotor Activity:  Normal  Concentration:  Concentration: Good and Attention Span: Good  Recall:  Good  Fund of Knowledge: Good  Language: Good  Akathisia:  No  Handed:  Right  AIMS (if indicated): not done  Assets:  Communication Skills Desire for Improvement  ADL's:  Intact  Cognition: WNL  Sleep:  Poor   Screenings: AIMS    Flowsheet Row Office Visit from 05/12/2017 in [facility] Office Visit from 07/29/2016 in [facility] Office Visit from 05/06/2016 in [facility] Office Visit from 04/01/2016 in [facility] Office Visit from 03/28/2015 in [facility]  AIMS Total Score 0 0 0 0 0      GAD-7    Flowsheet Row Office Visit from 03/10/2022 in [facility] Office Visit from 01/22/2022 in [facility] Office Visit from 08/28/2021 in [facility] Office Visit from 05/21/2021 in [facility] Office Visit from 01/07/2021 in [facility]  Total GAD-7 Score 9 7 4 14 7       Mini-Mental    Flowsheet Row Office Visit from 09/07/2019 in [facility]  Total Score (max 30 points ) 16      PHQ2-9    Flowsheet Row Office Visit from 03/10/2022 in [facility] Office Visit from 01/22/2022 in [facility] Office Visit from 09/08/2021 in [facility] Office Visit from 08/28/2021 in [facility] Office Visit from 07/03/2021 in [facility]  PHQ-2 Total Score 2 2 3 2 2   PHQ-9 Total Score 10 7 19 11 13       Flowsheet Row Office Visit from 01/22/2022 in [facility] Office Visit from 09/08/2021 in [facility] Office Visit from 07/03/2021 in [facility]  C-SSRS RISK CATEGORY No Risk Error: Q3, 4, or 5 should not be populated when Q2 is No No Risk        Assessment and Plan:  Tabitha [name] is a 51 y.o. year old female with a history of schizoaffective disorder,  sleep apnea (using CPAP machine), diabetes, hypertension, hyperlipidemia, GERD, who presents for follow up appointment for below.   1. Schizoaffective disorder, depressive type (HCC) 2. Obsessive-compulsive disorder, unspecified type 3. Anxiety state She continues to report anxiety and mild paranoia in relation to driving her kids to school due to her husband being registered as a sex offender.  Will continue Invega IM, olanzapine to target schizoaffective disorder. oted  that she had residual hallucinations with Invega injection only, and has benefit from 2 antipsychotics.  Will not try Clozapine due to his significant metabolic side effect/lack of injection.  Will continue Depakote to target schizoaffective disorder.  Will continue Viibryd to target depression and anxiety.  Will continue gabapentin for anxiety.  Although she originally reports ambivalence for therapy, she is willing to try after provided psychoeducation.  Will make referral.   # prolactin  The level has been trending down.  She denies symptoms of galactorrhea.  Will continue to monitor this.    Plan (she will contact the office if she needs a  refill) Continue monthly invega 234 mg IM - monitor weight gain. She is on metformin.  Continue olanzapine 7.5 mg at night - monitor weight gain Continue Depakote 500 mg daily (VPA 46 11/2021, LFT wnl 09/2021, Plt wnl 11/2021) Continue Viibryd 40 mg daily Continue gabapentin 600 mg 3 times a day Obtain labs (CMP, LFT, VPA) Obtain EKG Next appointment- 1/11 at 11:30. In person  - She [facility] PCP at [facility]   Past trials of medication: Abilify (hives), olanzapine, quetiapine, risperidone (stopped working),      Secretary/administrator of Care: Collaboration of Care: Other reviewed notes in Epic  Patient/Guardian was advised Release of Information must be obtained prior to any record release in order to collaborate their care with an outside provider. Patient/Guardian was advised if they have not already done so to contact the registration department to sign all necessary forms in order for [place] to release information regarding their care.   Consent: Patient/Guardian gives verbal consent for treatment and assignment of benefits for services provided during this visit. Patient/Guardian expressed understanding and agreed to proceed.    [name], MD 03/10/2022, 9:09 AM

## 2022-03-10 ENCOUNTER — Encounter: Payer: Self-pay | Admitting: Psychiatry

## 2022-03-10 ENCOUNTER — Ambulatory Visit (INDEPENDENT_AMBULATORY_CARE_PROVIDER_SITE_OTHER): Payer: Medicare Other | Admitting: Psychiatry

## 2022-03-10 VITALS — BP 124/72 | HR 81 | Temp 97.6°F | Ht 62.0 in | Wt 246.8 lb

## 2022-03-10 DIAGNOSIS — F429 Obsessive-compulsive disorder, unspecified: Secondary | ICD-10-CM | POA: Diagnosis not present

## 2022-03-10 DIAGNOSIS — F251 Schizoaffective disorder, depressive type: Secondary | ICD-10-CM

## 2022-03-10 DIAGNOSIS — F411 Generalized anxiety disorder: Secondary | ICD-10-CM

## 2022-03-10 NOTE — Patient Instructions (Signed)
Continue monthly invega 234 mg IM  Continue olanzapine 7.5 mg at night  Continue Depakote 500 mg daily  Continue Viibryd 40 mg daily Continue gabapentin 600 mg 3 times a day Obtain labs (CMP, LFT, VPA) Obtain EKG Next appointment- 1/11 at 11:30

## 2022-03-13 ENCOUNTER — Encounter: Payer: Self-pay | Admitting: Internal Medicine

## 2022-03-13 ENCOUNTER — Ambulatory Visit (INDEPENDENT_AMBULATORY_CARE_PROVIDER_SITE_OTHER): Payer: Medicare Other | Admitting: Internal Medicine

## 2022-03-13 VITALS — BP 132/78 | HR 106 | Temp 97.2°F | Wt 248.0 lb

## 2022-03-13 DIAGNOSIS — J069 Acute upper respiratory infection, unspecified: Secondary | ICD-10-CM

## 2022-03-13 DIAGNOSIS — J029 Acute pharyngitis, unspecified: Secondary | ICD-10-CM

## 2022-03-13 LAB — POC COVID19 BINAXNOW: SARS Coronavirus 2 Ag: NEGATIVE

## 2022-03-13 LAB — POCT INFLUENZA A/B
Influenza A, POC: NEGATIVE
Influenza B, POC: NEGATIVE

## 2022-03-13 LAB — POCT RAPID STREP A (OFFICE): Rapid Strep A Screen: NEGATIVE

## 2022-03-13 MED ORDER — AMOXICILLIN-POT CLAVULANATE 875-125 MG PO TABS: 1.0000 | ORAL_TABLET | Freq: Two times a day (BID) | ORAL | 0 refills | Status: DC

## 2022-03-13 NOTE — Patient Instructions (Signed)

## 2022-03-13 NOTE — Progress Notes (Signed)
Subjective:    Patient ID: Tabitha [name], female    DOB: Oct 09, 1970, 51 y.o.   MRN: [id number]  HPI  Pt presents to the clinic today with c/o headache, runny nose, sore throat and cough.  This started yesterday. The headache is located in her forehead. She describes the pain as pressure. She is blowing clear mucous out of her nose. She is having difficulty swallowing. The cough is non productive.  She denies nasal congestion, ear pain, shortness of breath, chest pain, nausea, vomiting or diarrhea.  She denies fever chills but has had body aches.  She has tried Dayquil and Tylenol OTC with minimal relief of symptoms.  She has had sick contacts with similar symptoms.  She has a history of asthma, managed on Flovent and Albuterol.  Review of Systems     Past Medical History:  Diagnosis Date   Allergic rhinitis    Anxiety    Asthma    Bipolar disorder (HCC)    Depression    Diabetes mellitus without complication (HCC)    GERD (gastroesophageal reflux disease)    Hyperlipidemia    Hypertension    Mild sleep apnea    OCD (obsessive compulsive disorder)    Palpitations    Paranoia (HCC)    Schizoaffective disorder (HCC)    Sleep apnea     Current Outpatient Medications  Medication Sig Dispense Refill   ACCU-CHEK GUIDE test strip USE AS DIRECTED TO TEST TWICE DAILY 100 strip 2   albuterol (VENTOLIN HFA) 108 (90 Base) MCG/ACT inhaler INHALE 2 PUFFS INTO THE LUNGS EVERY 6 HOURS AS NEEDED FOR WHEEZING OR SHORTNESS OF BREATH 25.5 g 3   atorvastatin (LIPITOR) 40 MG tablet Take 1 tablet (40 mg total) by mouth daily. 90 tablet 1   benzonatate (TESSALON) 200 MG capsule Take 1 capsule (200 mg total) by mouth 3 (three) times daily as needed for cough. 30 capsule 0   Continuous Blood Gluc Sensor (DEXCOM G6 SENSOR) MISC Apply 1 sensor every 10 days 9 each 0   Continuous Blood Gluc Transmit (DEXCOM G6 TRANSMITTER) MISC Use to check blood sugar. DX E11.9 1 each 0   divalproex (DEPAKOTE ER) 500  MG 24 hr tablet TAKE 1 TABLET(500 MG) BY MOUTH DAILY 90 tablet 0   EPINEPHrine 0.3 mg/0.3 mL IJ SOAJ injection Inject 0.3 mg into the muscle as needed for anaphylaxis. 1 each 0   ezetimibe (ZETIA) 10 MG tablet TAKE 1 TABLET(10 MG) BY MOUTH DAILY 90 tablet 1   fluticasone (FLOVENT HFA) 110 MCG/ACT inhaler Inhale 1 puff into the lungs 2 (two) times daily. 1 Inhaler 11   gabapentin (NEURONTIN) 600 MG tablet Take 1 tablet (600 mg total) by mouth 3 (three) times daily. 270 tablet 0   hydrochlorothiazide (HYDRODIURIL) 12.5 MG tablet TAKE 1 TABLET(12.5 MG) BY MOUTH DAILY 90 tablet 1   ibuprofen (ADVIL) 200 MG tablet Take 200 mg by mouth every 6 (six) hours as needed.     insulin degludec (TRESIBA FLEXTOUCH) 100 UNIT/ML FlexTouch Pen ADMINISTER 40 UNITS UNDER THE SKIN EVERY DAY 15 mL 2   Insulin Pen Needle (BD PEN NEEDLE NANO U/F) 32G X 4 MM MISC 1 each by Does not apply route daily. 100 each 2   INVEGA SUSTENNA 234 MG/1.5ML injection ADMINISTER 1.5 ML IN THE MUSCLE EVERY 28 DAYS 1.5 mL 11   MELATONIN PO Take 5 mg by mouth at bedtime as needed.     metFORMIN (GLUCOPHAGE) 500 MG tablet TAKE 2  TABLETS(1000 MG) BY MOUTH TWICE DAILY WITH A MEAL 360 tablet 1   NOVOTWIST 32G X 5 MM MISC USE WITH LEVEMIR PEN BID  5   OLANZapine (ZYPREXA) 7.5 MG tablet Take 1 tablet (7.5 mg total) by mouth at bedtime. 90 tablet 0   omeprazole (PRILOSEC) 10 MG capsule TAKE 1 CAPSULE BY MOUTH DAILY 90 capsule 1   pioglitazone (ACTOS) 45 MG tablet TAKE 1 TABLET(45 MG) BY MOUTH DAILY 90 tablet 0   Vilazodone HCl (VIIBRYD) 40 MG TABS Take 1 tablet (40 mg total) by mouth daily. 90 tablet 1   Current Facility-Administered Medications  Medication Dose Route Frequency Provider Last Rate Last Admin   paliperidone (INVEGA SUSTENNA) injection 234 mg  234 mg Intramuscular Q28 days [name], MD   234 mg at 02/23/22 1119    Allergies  Allergen Reactions   Abilify [Aripiprazole] Hives    Family History  Problem Relation Age of  Onset   Lupus Mother    Diabetes Mother        pre diabetes   Diabetes Father    Hyperlipidemia Father    Diabetes Brother    Depression Paternal Grandmother    Bipolar disorder Paternal Grandmother     Social History   Socioeconomic History   Marital status: Married    Spouse name: Matalie Romberger   Number of children: 2   Years of education: 16   Highest education level: Bachelor's degree (e.g., BA, AB, BS)  Occupational History   Occupation: disability  Tobacco Use   Smoking status: Never   Smokeless tobacco: Never  Vaping Use   Vaping Use: Never used  Substance and Sexual Activity   Alcohol use: Not Currently   Drug use: Never   Sexual activity: Yes    Partners: Male    Birth control/protection: Surgical  Other Topics Concern   Not on file  Social History Narrative   Not on file   Social Determinants of Health   Financial Resource Strain: Low Risk  (09/11/2021)   Overall Financial Resource Strain (CARDIA)    Difficulty of Paying Living Expenses: Not hard at all  Food Insecurity: No Food Insecurity (09/11/2021)   Hunger Vital Sign    Worried About Running Out of Food in the Last Year: Never true    Ran Out of Food in the Last Year: Never true  Transportation Needs: No Transportation Needs (09/11/2021)   PRAPARE - Administrator, Civil Service (Medical): No    Lack of Transportation (Non-Medical): No  Physical Activity: Inactive (09/11/2021)   Exercise Vital Sign    Days of Exercise per Week: 0 days    Minutes of Exercise per Session: 0 min  Stress: Stress Concern Present (09/11/2021)   [employer] of Occupational Health - Occupational Stress Questionnaire    Feeling of Stress : Rather much  Social Connections: Socially Integrated (09/11/2021)   Social Connection and Isolation Panel [NHANES]    Frequency of Communication with Friends and Family: Once a week    Frequency of Social Gatherings with Friends and Family: More than three times a week     Attends Religious Services: More than 4 times per year    Active Member of [employer] or Organizations: Yes    Attends Banker Meetings: More than 4 times per year    Marital Status: Married  Catering manager Violence: Not At Risk (05/12/2017)   Humiliation, Afraid, Rape, and Kick questionnaire    Fear of Current or  Ex-Partner: No    Emotionally Abused: No    Physically Abused: No    Sexually Abused: No     Constitutional: Patient reports headache and body aches.  Denies fever, malaise, fatigue, or abrupt weight changes.  HEENT: Pt reports runny nose, sore throat. Denies eye pain, eye redness, ear pain, ringing in the ears, wax buildup, nasal congestion, bloody nose. Respiratory: Patient reports cough.  Denies difficulty breathing, shortness of breath or sputum production.   Cardiovascular: Denies chest pain, chest tightness, palpitations or swelling in the hands or feet.  Gastrointestinal: Denies abdominal pain, bloating, constipation, diarrhea or blood in the stool.    No other specific complaints in a complete review of systems (except as listed in HPI above).  Objective:   Physical Exam  BP 132/78 (BP Location: Left Arm, Patient Position: Sitting, Cuff Size: Normal)   Pulse (!) 106   Temp (!) 97.2 F (36.2 C) (Temporal)   Wt 248 lb (112.5 kg)   SpO2 97%   BMI 45.36 kg/m   Wt Readings from Last 3 Encounters:  12/30/21 240 lb 12.8 oz (109.2 kg)  12/16/21 238 lb (108 kg)  08/28/21 246 lb (111.6 kg)    General: Appears her stated age, obese in NAD. Skin: Warm, dry and intact. No rashes noted. HEENT: Head: normal shape and size, maxillary and frontal sinus pressure noted; Eyes: sclera white, no icterus, conjunctiva pink, PERRLA and EOMs intact; Throat/Mouth: Teeth present, mucosa erythematous and moist, no exudate, lesions or ulcerations noted.  Neck: No adenopathy noted. Cardiovascular: Tachycardic with normal rhythm. S1,S2 noted.  No murmur, rubs or gallops noted.  ed. Pulmonary/Chest: Normal effort and positive vesicular breath sounds. No respiratory distress. No wheezes, rales or ronchi noted.  Neurological: Alert and oriented.  BMET    Component Value Date/Time   NA 136 12/17/2021 1035   NA 137 09/07/2019 1657   K 3.7 12/17/2021 1035   CL 99 12/17/2021 1035   CO2 22 12/17/2021 1035   GLUCOSE 224 (H) 12/17/2021 1035   BUN 7 12/17/2021 1035   BUN 14 09/07/2019 1657   CREATININE 0.71 12/17/2021 1035   CALCIUM 9.5 12/17/2021 1035   GFRNONAA 76 09/07/2019 1657   GFRAA 88 09/07/2019 1657    Lipid Panel     Component Value Date/Time   CHOL 167 12/17/2021 1035   CHOL 138 09/07/2019 1657   CHOL 176 04/23/2017 1341   TRIG 445 (H) 12/17/2021 1035   TRIG 253 (H) 04/23/2017 1341   HDL 49 (L) 12/17/2021 1035   HDL 55 09/07/2019 1657   CHOLHDL 3.4 12/17/2021 1035   VLDL 51 (H) 04/23/2017 1341   LDLCALC  12/17/2021 1035     Comment:     . LDL cholesterol not calculated. Triglyceride levels greater than 400 mg/dL invalidate calculated LDL results. . Reference range: <100 . Desirable range <100 mg/dL for primary prevention;   <70 mg/dL for patients with CHD or diabetic patients  with > or = 2 CHD risk factors. Marland Kitchen LDL-C is now calculated using the Martin-Hopkins  calculation, which is a validated novel method providing  better accuracy than the Friedewald equation in the  estimation of LDL-C.  Horald Pollen et al. Lenox Ahr. 7096;283(66): 2061-2068  (http://education.[facility].com/faq/FAQ164)     CBC    Component Value Date/Time   WBC 5.4 12/17/2021 1035   RBC 4.42 12/17/2021 1035   HGB 13.1 12/17/2021 1035   HGB 13.3 04/28/2018 1604   HCT 39.3 12/17/2021 1035   HCT 39.6  04/28/2018 1604   PLT 175 12/17/2021 1035   PLT 201 04/28/2018 1604   MCV 88.9 12/17/2021 1035   MCV 86 04/28/2018 1604   MCH 29.6 12/17/2021 1035   MCHC 33.3 12/17/2021 1035   RDW 15.0 12/17/2021 1035   RDW 15.1 04/28/2018 1604   LYMPHSABS 2.4 04/28/2018 1604    MONOABS 378 09/26/2015 1154   EOSABS 0.1 04/28/2018 1604   BASOSABS 0.0 04/28/2018 1604    Hgb A1C Lab Results  Component Value Date   HGBA1C 8.5 (A) 12/16/2021           Assessment & Plan:  Viral URI with Cough:  Rapid influenza negative Rapid COVID negative  Strep test negative Encourage rest and fluids I want to avoid steroids given her history of uncontrolled diabetes Can try Zyrtec and Flonase OTC Rx for Augmentin 875-125 mg twice daily x10 days to start on Sunday if symptoms persist or worsen Can take ibuprofen 600 mg every 8 hours as needed for sore throat and body aches   RTC in 1 month for follow up chronic conditions

## 2022-03-16 DIAGNOSIS — E119 Type 2 diabetes mellitus without complications: Secondary | ICD-10-CM | POA: Diagnosis not present

## 2022-03-16 LAB — HM DIABETES EYE EXAM

## 2022-03-23 ENCOUNTER — Encounter: Payer: Self-pay | Admitting: Internal Medicine

## 2022-03-23 ENCOUNTER — Ambulatory Visit (INDEPENDENT_AMBULATORY_CARE_PROVIDER_SITE_OTHER): Payer: Medicare Other | Admitting: Internal Medicine

## 2022-03-23 VITALS — BP 128/67 | HR 82 | Temp 96.8°F | Wt 245.0 lb

## 2022-03-23 DIAGNOSIS — R519 Headache, unspecified: Secondary | ICD-10-CM | POA: Diagnosis not present

## 2022-03-23 DIAGNOSIS — F422 Mixed obsessional thoughts and acts: Secondary | ICD-10-CM

## 2022-03-23 DIAGNOSIS — K591 Functional diarrhea: Secondary | ICD-10-CM | POA: Diagnosis not present

## 2022-03-23 DIAGNOSIS — E782 Mixed hyperlipidemia: Secondary | ICD-10-CM

## 2022-03-23 DIAGNOSIS — F251 Schizoaffective disorder, depressive type: Secondary | ICD-10-CM

## 2022-03-23 DIAGNOSIS — E1142 Type 2 diabetes mellitus with diabetic polyneuropathy: Secondary | ICD-10-CM | POA: Diagnosis not present

## 2022-03-23 DIAGNOSIS — F3175 Bipolar disorder, in partial remission, most recent episode depressed: Secondary | ICD-10-CM

## 2022-03-23 DIAGNOSIS — G4733 Obstructive sleep apnea (adult) (pediatric): Secondary | ICD-10-CM | POA: Diagnosis not present

## 2022-03-23 DIAGNOSIS — E1159 Type 2 diabetes mellitus with other circulatory complications: Secondary | ICD-10-CM

## 2022-03-23 DIAGNOSIS — K219 Gastro-esophageal reflux disease without esophagitis: Secondary | ICD-10-CM | POA: Diagnosis not present

## 2022-03-23 DIAGNOSIS — Z794 Long term (current) use of insulin: Secondary | ICD-10-CM | POA: Diagnosis not present

## 2022-03-23 DIAGNOSIS — J4530 Mild persistent asthma, uncomplicated: Secondary | ICD-10-CM | POA: Diagnosis not present

## 2022-03-23 DIAGNOSIS — D508 Other iron deficiency anemias: Secondary | ICD-10-CM | POA: Diagnosis not present

## 2022-03-23 DIAGNOSIS — Z6841 Body Mass Index (BMI) 40.0 and over, adult: Secondary | ICD-10-CM

## 2022-03-23 DIAGNOSIS — I152 Hypertension secondary to endocrine disorders: Secondary | ICD-10-CM

## 2022-03-23 LAB — POCT GLYCOSYLATED HEMOGLOBIN (HGB A1C): Hemoglobin A1C: 9 % — AB (ref 4.0–5.6)

## 2022-03-23 MED ORDER — GLIPIZIDE 10 MG PO TABS: 10.0000 mg | ORAL_TABLET | Freq: Two times a day (BID) | ORAL | 0 refills | Status: DC

## 2022-03-23 NOTE — Assessment & Plan Note (Signed)
Continue Depakote, Invega, Zyprexa and Viibryd per psychiatry 

## 2022-03-23 NOTE — Assessment & Plan Note (Signed)
Continue Flovent and albuterol Encouraged daily exercise to help condition the lungs to reduce shortness of breath

## 2022-03-23 NOTE — Assessment & Plan Note (Signed)
Continue Imodium OTC as needed We will monitor

## 2022-03-23 NOTE — Assessment & Plan Note (Signed)
Encourage diet and exercise for weight loss 

## 2022-03-23 NOTE — Assessment & Plan Note (Signed)
CBC today Not currently taking oral iron 

## 2022-03-23 NOTE — Assessment & Plan Note (Signed)
Encourage stress reduction techniques Continue ibuprofen OTC as needed

## 2022-03-23 NOTE — Assessment & Plan Note (Signed)
Managed without medication, will D/C HCTZ due to nonuse Reinforced DASH diet and exercise for weight loss

## 2022-03-23 NOTE — Assessment & Plan Note (Signed)
C-Met and lipid profile today Encouraged her to consume a low-fat diet and exercise for weight loss Continue atorvastatin and ezetimibe 

## 2022-03-23 NOTE — Assessment & Plan Note (Signed)
Encourage weight loss as this can help reduce sleep apnea symptoms Continue CPAP use She will continue to follow with pulmonology

## 2022-03-23 NOTE — Assessment & Plan Note (Signed)
POCT A1c 9% We will check urine microalbumin Referral to diabetes education and nutrition Continue metformin and Evaristo Bury We will discontinue Actos and start glipizide 10 mg twice daily Advised her to update me in 2 weeks with fasting sugars Encourage low-carb diet and exercise for weight loss Advised her to schedule an appointment for her diabetic eye exam Encourage routine foot exams Flu shot UTD Prevnar UTD She declines COVID vaccines

## 2022-03-23 NOTE — Assessment & Plan Note (Signed)
Avoid foods that trigger reflux Encourage weight loss as this can help reduce reflux symptoms Continue omeprazole and Tums as needed

## 2022-03-23 NOTE — Assessment & Plan Note (Signed)
Continue Depakote, Invega, Zyprexa and Viibryd per psychiatry

## 2022-03-23 NOTE — Progress Notes (Signed)
Subjective:    Patient ID: Darl HouseholderKathleen J Beckles, female    DOB: 01/02/1971, 51 y.o.   MRN: [id number]  HPI  Patient presents to clinic today for 4462-month follow-up of chronic conditions.  HTN: Her BP today is 128/67.  She is not taking HCTZ as prescribed.  ECG from 02/2022 reviewed.  OSA: She averages 7 hours of sleep per night with the use of her CPAP.  There is no sleep study on file.  She follows with pulmonology.  Asthma: She denies chronic cough but reports intermittent shortness of breath.  She is using Flovent and Albuterol as prescribed.  There are no PFTs on file.  She follows with pulmonology.  GERD: Triggered by spicy food.  She has occasional breakthrough on Omeprazole for which she takes Tums as needed with good relief of symptoms.  Upper GI from 11/2012 reviewed.  Diarrhea: Chronic, managed with Imodium as needed.  There is no colonoscopy on file.  She does not follow with GI.  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  She is taking Metformin, Actos, Tresiba and Gabapentin as prescribed.  Her sugars range 150-300.  She does not check her feet routinely.  Her last eye exam was within the last year.  Flu 11/2021.  COVID never.  Pneumovax 04/2015.  Prevnar 04/2021. She does not follow with endocrinology  OCD/Schizoaffective/Bipolar Disorder: Managed with Depakote, Invega, Zyprexa and Viibryd.  She is currently seeing a therapist and psychiatrist.  She denies SI/HI.  Iron Deficiency Anemia: Her last H/H was 13.1/39.3, 11/2021.  She is no longer taking taking oral Iron as prescribed.  She does not follow with hematology.  HLD: Her last LDL was not calculated, triglycerides 445, 11/2021.  She denies myalgias on Atorvastatin and Ezetimibe.  She does not consume a low-fat diet.  Frequent Headaches: These occur a few times per month.  Triggered by stress.  She takes Ibuprofen as needed with good relief of symptoms.  She does not follow with neurology.  Review of Systems      Past Medical History:  Diagnosis Date   Allergic rhinitis    Anxiety    Asthma    Bipolar disorder (HCC)    Depression    Diabetes mellitus without complication (HCC)    GERD (gastroesophageal reflux disease)    Hyperlipidemia    Hypertension    Mild sleep apnea    OCD (obsessive compulsive disorder)    Palpitations    Paranoia (HCC)    Schizoaffective disorder (HCC)    Sleep apnea     Current Outpatient Medications  Medication Sig Dispense Refill   ACCU-CHEK GUIDE test strip USE AS DIRECTED TO TEST TWICE DAILY 100 strip 2   albuterol (VENTOLIN HFA) 108 (90 Base) MCG/ACT inhaler INHALE 2 PUFFS INTO THE LUNGS EVERY 6 HOURS AS NEEDED FOR WHEEZING OR SHORTNESS OF BREATH 25.5 g 3   atorvastatin (LIPITOR) 40 MG tablet Take 1 tablet (40 mg total) by mouth daily. 90 tablet 1   Continuous Blood Gluc Sensor (DEXCOM G6 SENSOR) MISC Apply 1 sensor every 10 days 9 each 0   Continuous Blood Gluc Transmit (DEXCOM G6 TRANSMITTER) MISC Use to check blood sugar. DX E11.9 1 each 0   divalproex (DEPAKOTE ER) 500 MG 24 hr tablet TAKE 1 TABLET(500 MG) BY MOUTH DAILY 90 tablet 0   EPINEPHrine 0.3 mg/0.3 mL IJ SOAJ injection Inject 0.3 mg into the muscle as needed for anaphylaxis. 1 each 0   ezetimibe (ZETIA) 10 MG  tablet TAKE 1 TABLET(10 MG) BY MOUTH DAILY 90 tablet 1   fluticasone (FLOVENT HFA) 110 MCG/ACT inhaler Inhale 1 puff into the lungs 2 (two) times daily. 1 Inhaler 11   gabapentin (NEURONTIN) 600 MG tablet Take 1 tablet (600 mg total) by mouth 3 (three) times daily. 270 tablet 0   hydrochlorothiazide (HYDRODIURIL) 12.5 MG tablet TAKE 1 TABLET(12.5 MG) BY MOUTH DAILY 90 tablet 1   ibuprofen (ADVIL) 200 MG tablet Take 200 mg by mouth every 6 (six) hours as needed.     insulin degludec (TRESIBA FLEXTOUCH) 100 UNIT/ML FlexTouch Pen ADMINISTER 40 UNITS UNDER THE SKIN EVERY DAY 15 mL 2   Insulin Pen Needle (BD PEN NEEDLE NANO U/F) 32G X 4 MM MISC 1 each by Does not apply route daily. 100 each 2    INVEGA SUSTENNA 234 MG/1.5ML injection ADMINISTER 1.5 ML IN THE MUSCLE EVERY 28 DAYS 1.5 mL 11   MELATONIN PO Take 5 mg by mouth at bedtime as needed.     metFORMIN (GLUCOPHAGE) 500 MG tablet TAKE 2 TABLETS(1000 MG) BY MOUTH TWICE DAILY WITH A MEAL 360 tablet 1   NOVOTWIST 32G X 5 MM MISC USE WITH LEVEMIR PEN BID  5   OLANZapine (ZYPREXA) 7.5 MG tablet Take 1 tablet (7.5 mg total) by mouth at bedtime. 90 tablet 0   omeprazole (PRILOSEC) 10 MG capsule TAKE 1 CAPSULE BY MOUTH DAILY 90 capsule 1   pioglitazone (ACTOS) 45 MG tablet TAKE 1 TABLET(45 MG) BY MOUTH DAILY 90 tablet 0   Vilazodone HCl (VIIBRYD) 40 MG TABS Take 1 tablet (40 mg total) by mouth daily. 90 tablet 1   Current Facility-Administered Medications  Medication Dose Route Frequency Provider Last Rate Last Admin   paliperidone (INVEGA SUSTENNA) injection 234 mg  234 mg Intramuscular Q28 days [name], MD   234 mg at 02/23/22 1119    Allergies  Allergen Reactions   Abilify [Aripiprazole] Hives    Family History  Problem Relation Age of Onset   Lupus Mother    Diabetes Mother        pre diabetes   Diabetes Father    Hyperlipidemia Father    Diabetes Brother    Depression Paternal Grandmother    Bipolar disorder Paternal Grandmother     Social History   Socioeconomic History   Marital status: Married    Spouse name: Aleesha Ringstad   Number of children: 2   Years of education: 16   Highest education level: Bachelor's degree (e.g., BA, AB, BS)  Occupational History   Occupation: disability  Tobacco Use   Smoking status: Never   Smokeless tobacco: Never  Vaping Use   Vaping Use: Never used  Substance and Sexual Activity   Alcohol use: Not Currently   Drug use: Never   Sexual activity: Yes    Partners: Male    Birth control/protection: Surgical  Other Topics Concern   Not on file  Social History Narrative   Not on file   Social Determinants of Health   Financial Resource Strain: Low Risk  (09/11/2021)    Overall Financial Resource Strain (CARDIA)    Difficulty of Paying Living Expenses: Not hard at all  Food Insecurity: No Food Insecurity (09/11/2021)   Hunger Vital Sign    Worried About Running Out of Food in the Last Year: Never true    Ran Out of Food in the Last Year: Never true  Transportation Needs: No Transportation Needs (09/11/2021)   PRAPARE - Transportation  Lack of Transportation (Medical): No    Lack of Transportation (Non-Medical): No  Physical Activity: Inactive (09/11/2021)   Exercise Vital Sign    Days of Exercise per Week: 0 days    Minutes of Exercise per Session: 0 min  Stress: Stress Concern Present (09/11/2021)   [employer] of Occupational Health - Occupational Stress Questionnaire    Feeling of Stress : Rather much  Social Connections: Socially Integrated (09/11/2021)   Social Connection and Isolation Panel [NHANES]    Frequency of Communication with Friends and Family: Once a week    Frequency of Social Gatherings with Friends and Family: More than three times a week    Attends Religious Services: More than 4 times per year    Active Member of [employer] or Organizations: Yes    Attends Banker Meetings: More than 4 times per year    Marital Status: Married  Catering manager Violence: Not At Risk (05/12/2017)   Humiliation, Afraid, Rape, and Kick questionnaire    Fear of Current or Ex-Partner: No    Emotionally Abused: No    Physically Abused: No    Sexually Abused: No     Constitutional: Patient reports intermittent headaches.  Denies fever, malaise, fatigue, or abrupt weight changes.  HEENT: Denies eye pain, eye redness, ear pain, ringing in the ears, wax buildup, runny nose, nasal congestion, bloody nose, or sore throat. Respiratory: Patient reports intermittent shortness of breath.  Denies difficulty breathing, cough or sputum production.   Cardiovascular: Denies chest pain, chest tightness, palpitations or swelling in the hands or feet.   Gastrointestinal: Patient reports intermittent reflux, diarrhea.  Denies abdominal pain, bloating, constipation, or blood in the stool.  GU: Denies urgency, frequency, pain with urination, burning sensation, blood in urine, odor or discharge. Musculoskeletal: Denies decrease in range of motion, difficulty with gait, muscle pain or joint pain and swelling.  Skin: Denies redness, rashes, lesions or ulcercations.  Neurological: Patient reports neuropathic pain.  Denies dizziness, difficulty with memory, difficulty with speech or problems with balance and coordination.  Psych: Patient has a history of depression.  Denies anxiety, SI/HI.  No other specific complaints in a complete review of systems (except as listed in HPI above).  Objective:   Physical Exam  BP 128/67 (BP Location: Left Arm, Patient Position: Sitting, Cuff Size: Large)   Pulse 82   Temp (!) 96.8 F (36 C) (Temporal)   Wt 245 lb (111.1 kg)   SpO2 98%   BMI 44.81 kg/m   Wt Readings from Last 3 Encounters:  03/13/22 248 lb (112.5 kg)  12/30/21 240 lb 12.8 oz (109.2 kg)  12/16/21 238 lb (108 kg)    General: Appears her stated age, obese in NAD. Skin: Warm, dry and intact. No ulcerations noted. HEENT: Head: normal shape and size; Eyes: sclera white, no icterus, conjunctiva pink, PERRLA and EOMs intact;  Cardiovascular: Normal rate and rhythm. S1,S2 noted.  No murmur, rubs or gallops noted. No JVD or BLE edema. No carotid bruits noted. Pulmonary/Chest: Normal effort and positive vesicular breath sounds. No respiratory distress. No wheezes, rales or ronchi noted.  Abdomen: Soft and nontender. Normal bowel sounds.  Musculoskeletal: No difficulty with gait.  Neurological: Alert and oriented.  Psychiatric: Mood and affect normal. Behavior is normal. Judgment and thought content normal.    BMET    Component Value Date/Time   NA 136 12/17/2021 1035   NA 137 09/07/2019 1657   K 3.7 12/17/2021 1035   CL 99 12/17/2021  1035    CO2 22 12/17/2021 1035   GLUCOSE 224 (H) 12/17/2021 1035   BUN 7 12/17/2021 1035   BUN 14 09/07/2019 1657   CREATININE 0.71 12/17/2021 1035   CALCIUM 9.5 12/17/2021 1035   GFRNONAA 76 09/07/2019 1657   GFRAA 88 09/07/2019 1657    Lipid Panel     Component Value Date/Time   CHOL 167 12/17/2021 1035   CHOL 138 09/07/2019 1657   CHOL 176 04/23/2017 1341   TRIG 445 (H) 12/17/2021 1035   TRIG 253 (H) 04/23/2017 1341   HDL 49 (L) 12/17/2021 1035   HDL 55 09/07/2019 1657   CHOLHDL 3.4 12/17/2021 1035   VLDL 51 (H) 04/23/2017 1341   LDLCALC  12/17/2021 1035     Comment:     . LDL cholesterol not calculated. Triglyceride levels greater than 400 mg/dL invalidate calculated LDL results. . Reference range: <100 . Desirable range <100 mg/dL for primary prevention;   <70 mg/dL for patients with CHD or diabetic patients  with > or = 2 CHD risk factors. Marland Kitchen LDL-C is now calculated using the Martin-Hopkins  calculation, which is a validated novel method providing  better accuracy than the Friedewald equation in the  estimation of LDL-C.  Horald Pollen et al. Lenox Ahr. 2633;354(56): 2061-2068  (http://education.[facility].com/faq/FAQ164)     CBC    Component Value Date/Time   WBC 5.4 12/17/2021 1035   RBC 4.42 12/17/2021 1035   HGB 13.1 12/17/2021 1035   HGB 13.3 04/28/2018 1604   HCT 39.3 12/17/2021 1035   HCT 39.6 04/28/2018 1604   PLT 175 12/17/2021 1035   PLT 201 04/28/2018 1604   MCV 88.9 12/17/2021 1035   MCV 86 04/28/2018 1604   MCH 29.6 12/17/2021 1035   MCHC 33.3 12/17/2021 1035   RDW 15.0 12/17/2021 1035   RDW 15.1 04/28/2018 1604   LYMPHSABS 2.4 04/28/2018 1604   MONOABS 378 09/26/2015 1154   EOSABS 0.1 04/28/2018 1604   BASOSABS 0.0 04/28/2018 1604    Hgb A1C Lab Results  Component Value Date   HGBA1C 8.5 (A) 12/16/2021           Assessment & Plan:      RTC in 3 months for your annual exam Nicki Reaper, NP

## 2022-03-23 NOTE — Patient Instructions (Signed)

## 2022-03-24 ENCOUNTER — Ambulatory Visit: Payer: Medicare Other

## 2022-03-24 ENCOUNTER — Encounter: Payer: Self-pay | Admitting: Internal Medicine

## 2022-03-24 LAB — COMPREHENSIVE METABOLIC PANEL
AG Ratio: 1.9 (calc) (ref 1.0–2.5)
ALT: 21 U/L (ref 6–29)
AST: 17 U/L (ref 10–35)
Albumin: 4.2 g/dL (ref 3.6–5.1)
Alkaline phosphatase (APISO): 99 U/L (ref 31–125)
BUN: 12 mg/dL (ref 7–25)
CO2: 26 mmol/L (ref 20–32)
Calcium: 9.4 mg/dL (ref 8.6–10.2)
Chloride: 100 mmol/L (ref 98–110)
Creat: 0.6 mg/dL (ref 0.50–0.99)
Globulin: 2.2 g/dL (calc) (ref 1.9–3.7)
Glucose, Bld: 236 mg/dL — ABNORMAL HIGH (ref 65–99)
Potassium: 4.3 mmol/L (ref 3.5–5.3)
Sodium: 137 mmol/L (ref 135–146)
Total Bilirubin: 0.4 mg/dL (ref 0.2–1.2)
Total Protein: 6.4 g/dL (ref 6.1–8.1)

## 2022-03-24 LAB — MICROALBUMIN / CREATININE URINE RATIO
Creatinine, Urine: 26 mg/dL (ref 20–275)
Microalb Creat Ratio: 8 mcg/mg creat (ref <30)
Microalb, Ur: 0.2 mg/dL

## 2022-03-24 LAB — LIPID PANEL
Cholesterol: 167 mg/dL (ref <200)
HDL: 50 mg/dL (ref >=50)
LDL Cholesterol (Calc): 69 mg/dL (calc)
Non-HDL Cholesterol (Calc): 117 mg/dL (calc) (ref <130)
Total CHOL/HDL Ratio: 3.3 (calc) (ref <5.0)
Triglycerides: 397 mg/dL — ABNORMAL HIGH (ref <150)

## 2022-03-24 NOTE — Progress Notes (Signed)
I understand. Thanks for checking with them.

## 2022-03-24 NOTE — Progress Notes (Signed)
Hi, I saw the blood test result. Do you mind ordering add on VPA level to this blood sample? Although I did call them, they could not take an order from me as the original order was from you. If you cannot do it, I understand- I will ask the patient to have another blood test if that it the case. Thanks.

## 2022-03-25 ENCOUNTER — Telehealth: Payer: Self-pay | Admitting: Psychiatry

## 2022-03-25 ENCOUNTER — Telehealth: Payer: Self-pay

## 2022-03-25 NOTE — Telephone Encounter (Signed)
Could you contact the patient. Please advise the patient to get blood test (CBC, depakote level)  at [facility] outside of [place]. Although she did get lab at her PCP, these were not drawn. We could not do add on either, fyi.Thanks.

## 2022-03-25 NOTE — Telephone Encounter (Signed)
left message that pt primary did not due 2 labs that were needed. so dr. Vanetta Shawl wants her to go to [facility] and have a cbc and a lithium level done.

## 2022-03-25 NOTE — Telephone Encounter (Signed)
Spoke to Tabitha [name] with [employer]. He stated that order was voided due to missing sleep study. Tabitha [name] will need to take her machine into [facility] for [facility].   Spoke to Tabitha [name]. She stated that she would bring SD card to 03/26/2022 visit. Nothing further needed.

## 2022-03-26 ENCOUNTER — Ambulatory Visit: Payer: Medicare Other | Admitting: Adult Health

## 2022-03-30 ENCOUNTER — Ambulatory Visit: Payer: Medicare Other

## 2022-03-31 ENCOUNTER — Encounter: Payer: Self-pay | Admitting: *Deleted

## 2022-03-31 ENCOUNTER — Encounter: Payer: Medicare Other | Attending: Internal Medicine | Admitting: *Deleted

## 2022-03-31 VITALS — BP 132/82 | Ht 62.0 in | Wt 242.9 lb

## 2022-03-31 DIAGNOSIS — Z713 Dietary counseling and surveillance: Secondary | ICD-10-CM | POA: Diagnosis not present

## 2022-03-31 DIAGNOSIS — E119 Type 2 diabetes mellitus without complications: Secondary | ICD-10-CM | POA: Insufficient documentation

## 2022-03-31 DIAGNOSIS — E1165 Type 2 diabetes mellitus with hyperglycemia: Secondary | ICD-10-CM

## 2022-03-31 NOTE — Patient Instructions (Addendum)
Check blood sugars 2 x day before breakfast and 2 hrs after supper every day Bring blood sugar records or glucometer to the next appointment Ask MD about coverage for FreeStyle Libre  Exercise:  Walk in house for    10  minutes   5  days a week and gradually increase to 30 minutes 5 x week Don't sit for long periods (no longer than 1 hour)  Eat 3 meals day,   1-2  snacks a day Space meals 4-6 hours apart Limit desserts/sweets (2 x week) Avoid sugar sweetened drinks (soda, tea)  Complete 3 Day Food Record and bring to next appt  Carry fast acting glucose and a snack at all times Hold insulin pen in place for 5-10 seconds after injection Rotate injection sites  Return for appointment on:  Tuesday May 19, 2022 at 8:30 am with Velna Hatchet (nurse)

## 2022-03-31 NOTE — Progress Notes (Signed)
Diabetes Self-Management Education  Visit Type: First/Initial  Appt. Start Time: 0835 Appt. End Time: 0930  03/31/2022  [name], identified by name and date of birth, is a 51 y.o. female with a diagnosis of Diabetes: Type 2.   ASSESSMENT  Blood pressure 132/82, height 5\' 2"  (1.575 m), weight 242 lb 14.4 oz (110.2 kg). Body mass index is 44.43 kg/m.   Diabetes Self-Management Education - 03/31/22 0941       Visit Information   Visit Type First/Initial      Initial Visit   Diabetes Type Type 2    Date Diagnosed 2011    Are you currently following a meal plan? No    Are you taking your medications as prescribed? Yes      Health Coping   How would you rate your overall health? Fair      Psychosocial Assessment   Patient Belief/Attitude about Diabetes Other (comment)   "depressed, anxious"   What is the hardest part about your diabetes right now, causing you the most concern, or is the most worrisome to you about your diabetes?   Making healty food and beverage choices    Self-care barriers None    Self-management support Doctor's office;Family    Patient Concerns Nutrition/Meal planning;Glycemic Control;Medication    Special Needs None    Preferred Learning Style Visual;Other (comment)   talking/discussion   What is the last grade level you completed in school? college      Pre-Education Assessment   Patient understands the diabetes disease and treatment process. Needs Review    Patient understands incorporating nutritional management into lifestyle. Needs Instruction    Patient undertands incorporating physical activity into lifestyle. Needs Instruction    Patient understands using medications safely. Needs Review    Patient understands monitoring blood glucose, interpreting and using results Needs Review    Patient understands prevention, detection, and treatment of acute complications. Needs Instruction    Patient understands prevention, detection, and  treatment of chronic complications. Needs Review    Patient understands how to develop strategies to address psychosocial issues. Needs Instruction    Patient understands how to develop strategies to promote health/change behavior. Needs Instruction      Complications   Last HgB A1C per patient/outside source 9 %   03/23/2022   How often do you check your blood sugar? 1-2 times/day    Fasting Blood glucose range (mg/dL) 03/25/2022   pt reports FBG's 200's mg/dL   Postprandial Blood glucose range (mg/dL) >329   pt reports checking 1 hr pp - 200-300's mg/dL   Number of hyperglycemic episodes ( >200mg /dL): Daily    Can you tell when your blood sugar is high? Yes   feels tired   What do you do if your blood sugar is high? no plan    Have you had a dilated eye exam in the past 12 months? Yes    Have you had a dental exam in the past 12 months? No    Are you checking your feet? Yes    How many days per week are you checking your feet? 1      Dietary Intake   Breakfast cheese, waffle, corndog, eggs, bologna sandwich, cereal and milk, sausage, egg and cheese croissant    Lunch soups, bologna sandwich    Snack (afternoon) cheese and crackers    Dinner chicken, beef, fish, hot dogs, pizza, spaghetti, potatoes, corn, spinach, salads with lettuce tomato and cheese    Snack (evening)  sweets/desserts    Beverage(s) water, soda, sweet tea, coffee with no sugar, diet soda      Activity / Exercise   Activity / Exercise Type ADL's      Patient Education   Previous Diabetes Education Yes (please comment)   came to classes in 2011   Disease Pathophysiology Explored patient's options for treatment of their diabetes    Healthy Eating Role of diet in the treatment of diabetes and the relationship between the three main macronutrients and blood glucose level;Food label reading, portion sizes and measuring food.;Reviewed blood glucose goals for pre and post meals and how to evaluate the patients' food intake on  their blood glucose level.;Meal timing in regards to the patients' current diabetes medication.    Being Active Role of exercise on diabetes management, blood pressure control and cardiac health.    Medications Taught/reviewed insulin/injectables, injection, site rotation, insulin/injectables storage and needle disposal.;Reviewed patients medication for diabetes, action, purpose, timing of dose and side effects.    Monitoring Purpose and frequency of SMBG.;Taught/discussed recording of test results and interpretation of SMBG.;Identified appropriate SMBG and/or A1C goals.;Other (comment)   Dexcom not covered by insurance. Discussed FreeStyle Libre and to check with MD office and insurance to see if covered.   Acute complications Taught prevention, symptoms, and  treatment of hypoglycemia - the 15 rule.    Chronic complications Relationship between chronic complications and blood glucose control    Diabetes Stress and Support Identified and addressed patients feelings and concerns about diabetes      Individualized Goals (developed by patient)   Reducing Risk Other (comment)   improve blood sugars, decrease medications     Outcomes   Expected Outcomes Demonstrated interest in learning. Expect positive outcomes    Future DMSE 4-6 wks         Individualized Plan for Diabetes Self-Management Training:   Learning Objective:  Patient will have a greater understanding of diabetes self-management. Patient education plan is to attend individual and/or group sessions per assessed needs and concerns.   Plan:   Patient Instructions  Check blood sugars 2 x day before breakfast and 2 hrs after supper every day Bring blood sugar records or glucometer to the next appointment Ask MD about coverage for FreeStyle Libre  Exercise:  Walk in house for    10  minutes   5  days a week and gradually increase to 30 minutes 5 x week Don't sit for long periods (no longer than 1 hour)  Eat 3 meals day,   1-2   snacks a day Space meals 4-6 hours apart Limit desserts/sweets (2 x week) Avoid sugar sweetened drinks (soda, tea)  Complete 3 Day Food Record and bring to next appt  Carry fast acting glucose and a snack at all times Hold insulin pen in place for 5-10 seconds after injection Rotate injection sites  Return for appointment on:  Tuesday May 19, 2022 at 8:30 am with Velna Hatchet (nurse)  Expected Outcomes:  Demonstrated interest in learning. Expect positive outcomes  Education material provided:  General Meal Planning Guidelines Simple Meal Plan 3 Day Food Record Symptoms, causes and treatments of Hypoglycemia  If problems or questions, patient to contact team via:   Sharion Settler, RN, CCM, CDCES [phone]  Future DSME appointment: 4-6 wks May 19, 2022 with this educator

## 2022-04-06 ENCOUNTER — Ambulatory Visit (INDEPENDENT_AMBULATORY_CARE_PROVIDER_SITE_OTHER): Payer: Medicare Other

## 2022-04-06 VITALS — BP 132/79 | HR 92 | Temp 97.1°F | Ht 62.0 in | Wt 240.4 lb

## 2022-04-06 DIAGNOSIS — F259 Schizoaffective disorder, unspecified: Secondary | ICD-10-CM

## 2022-04-06 NOTE — Patient Instructions (Signed)
Patient in today for due every 28 day Invega Sustenna 234 mg/1.5 ml injection. Patient presented clean and answered questions. Patient did mentioned that she felt she could tell it was time for her injection she stated that she experienced some anxiety and depression. Patient denied any auditory or visual hallucinations, no suicidal or homicidal ideations or plans or intent to harm self or others. Patient reported still doing fine with every 28 day dosage and had no complaints or concerns.  Injection given in right upper out quant area. Patient tolerated injection without any complaint of pain or discomfort and agreed to return in 28 days for next due injection.   Patient to call if any issues or problems after injection today or prior to next appointment   [facility] [id number]  EXP 07/27/2023  GTIN [id number]  S/N [id number]  LOT [id number]

## 2022-04-18 ENCOUNTER — Other Ambulatory Visit: Payer: Self-pay | Admitting: Psychiatry

## 2022-05-05 NOTE — Progress Notes (Unsigned)
BH MD/PA/NP OP Progress Note  05/05/2022 1:24 PM Tabitha [name]  MRN:  [id number]  Chief Complaint: No chief complaint on file.  HPI: ***  Cbc, depakote  Visit Diagnosis: No diagnosis found.  Past Psychiatric History: Please see initial evaluation for full details. I have reviewed the history. No updates at this time.     Past Medical History:  Past Medical History:  Diagnosis Date   Allergic rhinitis    Anxiety    Asthma    Bipolar disorder ([place])    Depression    Diabetes mellitus without complication (HCC)    GERD (gastroesophageal reflux disease)    Hyperlipidemia    Hypertension    Mild sleep apnea    OCD (obsessive compulsive disorder)    Palpitations    Paranoia ([place])    Schizoaffective disorder ([place])    Sleep apnea     Past Surgical History:  Procedure Laterality Date   CESAREAN SECTION  2006/2008   CHOLECYSTECTOMY  2006    Family Psychiatric History: Please see initial evaluation for full details. I have reviewed the history. No updates at this time.     Family History:  Family History  Problem Relation Age of Onset   Lupus Mother    Diabetes Mother        pre diabetes   Diabetes Father    Hyperlipidemia Father    Diabetes Brother    Depression Paternal Grandmother    Bipolar disorder Paternal Grandmother     Social History:  Social History   Socioeconomic History   Marital status: Married    Spouse name: Tabitha [name]   Number of children: 2   Years of education: 16   Highest education level: Bachelor's degree (e.g., BA, AB, BS)  Occupational History   Occupation: disability  Tobacco Use   Smoking status: Never   Smokeless tobacco: Never  Vaping Use   Vaping Use: Never used  Substance and Sexual Activity   Alcohol use: Not Currently   Drug use: Never   Sexual activity: Yes    Partners: Male    Birth control/protection: Surgical  Other Topics Concern   Not on file  Social History Narrative   Not on file   Social Determinants of  Health   Financial Resource Strain: Low Risk  (09/11/2021)   Overall Financial Resource Strain (CARDIA)    Difficulty of Paying Living Expenses: Not hard at all  Food Insecurity: No Food Insecurity (09/11/2021)   Hunger Vital Sign    Worried About Running Out of Food in the Last Year: Never true    Ran Out of Food in the Last Year: Never true  Transportation Needs: No Transportation Needs (09/11/2021)   PRAPARE - Hydrologist (Medical): No    Lack of Transportation (Non-Medical): No  Physical Activity: Inactive (09/11/2021)   Exercise Vital Sign    Days of Exercise per Week: 0 days    Minutes of Exercise per Session: 0 min  Stress: Stress Concern Present (09/11/2021)   [place]    Feeling of Stress : Rather much  Social Connections: Socially Integrated (09/11/2021)   Social Connection and Isolation Panel [NHANES]    Frequency of Communication with Friends and Family: Once a week    Frequency of Social Gatherings with Friends and Family: More than three times a week    Attends Religious Services: More than 4 times per year    Active  Member of Clubs or Organizations: Yes    Attends Engineer, structural: More than 4 times per year    Marital Status: Married    Allergies:  Allergies  Allergen Reactions   Abilify [Aripiprazole] Hives    Metabolic Disorder Labs: Lab Results  Component Value Date   HGBA1C 9.0 (A) 03/23/2022   MPG 171 05/21/2021   MPG 166 01/08/2021   Lab Results  Component Value Date   PROLACTIN 37.8 (H) 01/22/2022   PROLACTIN 84.8 (H) 06/01/2017   Lab Results  Component Value Date   CHOL 167 03/23/2022   TRIG 397 (H) 03/23/2022   HDL 50 03/23/2022   CHOLHDL 3.3 03/23/2022   VLDL 51 (H) 04/23/2017   LDLCALC 69 03/23/2022   LDLCALC  12/17/2021     Comment:     . LDL cholesterol not calculated. Triglyceride levels greater than 400 mg/dL invalidate  calculated LDL results. . Reference range: <100 . Desirable range <100 mg/dL for primary prevention;   <70 mg/dL for patients with CHD or diabetic patients  with > or = 2 CHD risk factors. Marland Kitchen LDL-C is now calculated using the Martin-Hopkins  calculation, which is a validated novel method providing  better accuracy than the Friedewald equation in the  estimation of LDL-C.  Horald Pollen et al. Lenox Ahr. 4098;119(14): 2061-2068  (http://education.[employer].com/faq/FAQ164)    Lab Results  Component Value Date   TSH 1.026 01/22/2022   TSH 1.56 12/17/2021    Therapeutic Level Labs: No results found for: "LITHIUM" Lab Results  Component Value Date   VALPROATE 46 (L) 01/22/2022   VALPROATE 46.0 (L) 12/17/2021   No results found for: "CBMZ"  Current Medications: Current Outpatient Medications  Medication Sig Dispense Refill   ACCU-CHEK GUIDE test strip USE AS DIRECTED TO TEST TWICE DAILY 100 strip 2   albuterol (VENTOLIN HFA) 108 (90 Base) MCG/ACT inhaler INHALE 2 PUFFS INTO THE LUNGS EVERY 6 HOURS AS NEEDED FOR WHEEZING OR SHORTNESS OF BREATH 25.5 g 3   atorvastatin (LIPITOR) 40 MG tablet Take 1 tablet (40 mg total) by mouth daily. 90 tablet 1   divalproex (DEPAKOTE ER) 500 MG 24 hr tablet TAKE 1 TABLET(500 MG) BY MOUTH DAILY 90 tablet 0   EPINEPHrine 0.3 mg/0.3 mL IJ SOAJ injection Inject 0.3 mg into the muscle as needed for anaphylaxis. 1 each 0   ezetimibe (ZETIA) 10 MG tablet TAKE 1 TABLET(10 MG) BY MOUTH DAILY 90 tablet 1   fluticasone (FLOVENT HFA) 110 MCG/ACT inhaler Inhale 1 puff into the lungs 2 (two) times daily. 1 Inhaler 11   gabapentin (NEURONTIN) 600 MG tablet Take 1 tablet (600 mg total) by mouth 3 (three) times daily. 270 tablet 0   glipiZIDE (GLUCOTROL) 10 MG tablet Take 1 tablet (10 mg total) by mouth 2 (two) times daily before a meal. 180 tablet 0   ibuprofen (ADVIL) 200 MG tablet Take 200 mg by mouth every 6 (six) hours as needed.     insulin degludec (TRESIBA  FLEXTOUCH) 100 UNIT/ML FlexTouch Pen ADMINISTER 40 UNITS UNDER THE SKIN EVERY DAY 15 mL 2   Insulin Pen Needle (BD PEN NEEDLE NANO U/F) 32G X 4 MM MISC 1 each by Does not apply route daily. 100 each 2   INVEGA SUSTENNA 234 MG/1.5ML injection ADMINISTER 1.5 ML IN THE MUSCLE EVERY 28 DAYS 1.5 mL 11   MELATONIN PO Take 5 mg by mouth at bedtime as needed.     metFORMIN (GLUCOPHAGE) 500 MG tablet TAKE 2 TABLETS(1000  MG) BY MOUTH TWICE DAILY WITH A MEAL 360 tablet 1   OLANZapine (ZYPREXA) 7.5 MG tablet Take 1 tablet (7.5 mg total) by mouth at bedtime. 90 tablet 0   omeprazole (PRILOSEC) 10 MG capsule TAKE 1 CAPSULE BY MOUTH DAILY 90 capsule 1   Vilazodone HCl (VIIBRYD) 40 MG TABS Take 1 tablet (40 mg total) by mouth daily. 90 tablet 1   Current Facility-Administered Medications  Medication Dose Route Frequency Provider Last Rate Last Admin   paliperidone (INVEGA SUSTENNA) injection 234 mg  234 mg Intramuscular Q28 days [name], MD   234 mg at 04/06/22 1143     Musculoskeletal: Strength & Muscle Tone:  normal Gait & Station: normal Patient leans: N/A  Psychiatric Specialty Exam: Review of Systems  There were no vitals taken for this visit.There is no height or weight on file to calculate BMI.  General Appearance: {Appearance:22683}  Eye Contact:  {BHH EYE CONTACT:22684}  Speech:  Clear and Coherent  Volume:  Normal  Mood:  {BHH MOOD:22306}  Affect:  {Affect (PAA):22687}  Thought Process:  Coherent  Orientation:  Full (Time, Place, and Person)  Thought Content: Logical   Suicidal Thoughts:  {ST/HT (PAA):22692}  Homicidal Thoughts:  {ST/HT (PAA):22692}  Memory:  Immediate;   Good  Judgement:  {Judgement (PAA):22694}  Insight:  {Insight (PAA):22695}  Psychomotor Activity:  Normal  Concentration:  Concentration: Good and Attention Span: Good  Recall:  Good  Fund of Knowledge: Good  Language: Good  Akathisia:  No  Handed:  Right  AIMS (if indicated): not done  Assets:   Communication Skills Desire for Improvement  ADL's:  Intact  Cognition: WNL  Sleep:  {BHH GOOD/FAIR/POOR:22877}   Screenings: AIMS    Flowsheet Row Office Visit from 05/12/2017 in [facility] Office Visit from 07/29/2016 in [facility] Office Visit from 05/06/2016 in [facility] Office Visit from 04/01/2016 in [facility] Office Visit from 03/28/2015 in [facility]  AIMS Total Score 0 0 0 0 0      GAD-7    Flowsheet Row Office Visit from 03/23/2022 in [facility] Office Visit from 03/13/2022 in [facility] Office Visit from 03/10/2022 in [facility] Office Visit from 01/22/2022 in [facility] Office Visit from 08/28/2021 in [facility]  Total GAD-7 Score 8 7 9 7 4       Mini-Mental    Flowsheet Row Office Visit from 09/07/2019 in [facility]  Total Score (max 30 points ) 16      PHQ2-9    Flowsheet Row Nutrition from 03/31/2022 in [facility] Office Visit from 03/23/2022 in [facility] Office Visit from 03/13/2022 in [facility] Office Visit from 03/10/2022 in [facility] Office Visit from 01/22/2022 in [facility]  PHQ-2 Total Score 3 2 2 2 2   PHQ-9 Total Score 12 12 8 10 7       Flowsheet Row Office Visit from 01/22/2022 in [facility] Office Visit from 09/08/2021 in [facility] Office Visit from 07/03/2021 in [facility]  C-SSRS RISK CATEGORY No Risk Error: Q3, 4, or 5 should not be populated when Q2 is No No Risk        Assessment and Plan:  BEV DRENNEN is a 52 y.o. year old female with a history of schizoaffective disorder,  sleep apnea (using CPAP machine), diabetes, hypertension, hyperlipidemia, GERD, who presents for follow up appointment for below.    1. Schizoaffective disorder, depressive type (HCC) 2. Obsessive-compulsive disorder, unspecified type 3. Anxiety state She continues to report anxiety and mild paranoia in relation to driving her kids to school due to her husband being registered as a sex offender.  Will continue Invega IM, olanzapine to target schizoaffective disorder. oted that she had residual hallucinations with Invega injection only, and has benefit from 2 antipsychotics.  Will not try Clozapine due to his significant metabolic side effect/lack of injection.  Will continue Depakote to target schizoaffective disorder.  Will continue Viibryd to target depression and anxiety.  Will continue gabapentin for anxiety.  Although she originally reports ambivalence for therapy, she is willing to try after provided psychoeducation.  Will make referral.    # prolactin  The level has been trending down.  She denies symptoms of galactorrhea.  Will continue to monitor this.    Plan (she will contact the office if she needs a refill) Continue monthly invega 234 mg IM - monitor weight gain. She is on metformin.  Continue olanzapine 7.5 mg at night - monitor weight gain Continue Depakote 500 mg daily (VPA 46 11/2021, LFT wnl 09/2021, Plt wnl 11/2021) Continue Viibryd 40 mg daily Continue gabapentin 600 mg 3 times a day Obtain labs (CMP, LFT, VPA) Obtain EKG Next appointment- 1/11 at 11:30. In person  - She sees her PCP at [facility]   Past trials of medication: Abilify (hives), olanzapine, quetiapine, risperidone (stopped working),          Secretary/administrator of Care: Collaboration of Care: {BH OP Collaboration of [employer]  Patient/Guardian was advised Release of Information must be obtained prior to any record release in order to collaborate their care with an outside provider. Patient/Guardian  was advised if they have not already done so to contact the registration department to sign all necessary forms in order for [place] to release information regarding their care.   Consent: Patient/Guardian gives verbal consent for treatment and assignment of benefits for services provided during this visit. Patient/Guardian expressed understanding and agreed to proceed.    [name], MD 05/05/2022, 1:24 PM

## 2022-05-07 ENCOUNTER — Encounter: Payer: Self-pay | Admitting: Psychiatry

## 2022-05-07 ENCOUNTER — Ambulatory Visit (INDEPENDENT_AMBULATORY_CARE_PROVIDER_SITE_OTHER): Payer: Medicare Other

## 2022-05-07 ENCOUNTER — Ambulatory Visit (INDEPENDENT_AMBULATORY_CARE_PROVIDER_SITE_OTHER): Payer: Medicare Other | Admitting: Psychiatry

## 2022-05-07 VITALS — BP 128/77 | HR 81 | Temp 98.3°F | Ht 63.0 in | Wt 243.0 lb

## 2022-05-07 VITALS — BP 128/77 | HR 81 | Temp 97.9°F | Ht 63.0 in | Wt 243.0 lb

## 2022-05-07 DIAGNOSIS — F251 Schizoaffective disorder, depressive type: Secondary | ICD-10-CM | POA: Diagnosis not present

## 2022-05-07 DIAGNOSIS — F411 Generalized anxiety disorder: Secondary | ICD-10-CM | POA: Diagnosis not present

## 2022-05-07 DIAGNOSIS — F429 Obsessive-compulsive disorder, unspecified: Secondary | ICD-10-CM | POA: Diagnosis not present

## 2022-05-07 MED ORDER — VILAZODONE HCL 40 MG PO TABS: 40.0000 mg | ORAL_TABLET | Freq: Every day | ORAL | 1 refills | Status: DC

## 2022-05-07 NOTE — Progress Notes (Signed)
Patient in today for due Invega Sustenna 234 mg/1.5 ml IM every 28 day injection. Patient presented with flat affect, level and pleasant mood.  Patient denied any recent problems with current medication regimen, no current auditory or visual hallucinations and no suicidal or homicidal ideations, plan, intent, or means to want to harm self or others at this time.  Patient's due Invega Sustenna 234 mg/1.5 ml IM injection prepared as ordered and given to patient in her left upper outer gluteal quadrant. Patient tolerated due injection without complaint of pain or discomfort and agreed to return in 4 weeks for her next due injection. Patient also to see Dr. Modesta Messing today for scheduled evaluation.  Patient to call if any issues or concerns prior to next appointment.

## 2022-05-07 NOTE — Patient Instructions (Signed)
Continue monthly Invega 234 mg IM .  Continue olanzapine 7.5 mg at night  Continue Depakote 500 mg daily  Continue Viibryd 40 mg daily Continue gabapentin 600 mg 3 times a day Obtain labs (CMP, LFT, VPA) Obtain EKG Next appointment- 2/20 at 11:30

## 2022-05-11 ENCOUNTER — Telehealth: Payer: Self-pay

## 2022-05-11 ENCOUNTER — Other Ambulatory Visit: Payer: Self-pay | Admitting: Psychiatry

## 2022-05-11 DIAGNOSIS — F251 Schizoaffective disorder, depressive type: Secondary | ICD-10-CM

## 2022-05-11 MED ORDER — GABAPENTIN 600 MG PO TABS: 600.0000 mg | ORAL_TABLET | Freq: Three times a day (TID) | ORAL | 0 refills | Status: DC

## 2022-05-11 NOTE — Telephone Encounter (Signed)
Ordered

## 2022-05-11 NOTE — Telephone Encounter (Signed)
received a fax requesting a refill on the gabapentin . pt last seen on 05-07-22 next appt 06-16-22

## 2022-05-12 NOTE — Telephone Encounter (Signed)
pt notified that rx was sent to the pharmacy .  

## 2022-05-19 ENCOUNTER — Ambulatory Visit: Payer: Medicare Other | Admitting: *Deleted

## 2022-05-25 DIAGNOSIS — G4733 Obstructive sleep apnea (adult) (pediatric): Secondary | ICD-10-CM | POA: Diagnosis not present

## 2022-05-26 ENCOUNTER — Encounter: Payer: Medicare Other | Attending: Internal Medicine | Admitting: *Deleted

## 2022-05-26 ENCOUNTER — Encounter: Payer: Self-pay | Admitting: *Deleted

## 2022-05-26 VITALS — BP 140/76 | Wt 245.2 lb

## 2022-05-26 DIAGNOSIS — Z794 Long term (current) use of insulin: Secondary | ICD-10-CM | POA: Insufficient documentation

## 2022-05-26 DIAGNOSIS — E1165 Type 2 diabetes mellitus with hyperglycemia: Secondary | ICD-10-CM | POA: Diagnosis not present

## 2022-05-26 NOTE — Patient Instructions (Signed)
Check blood sugars 2 x day before breakfast and 2 hrs after supper every day  Exercise: Walk in the house  for    10  minutes   5 days a week and gradually increase to 30 minutes 5 x week Don't sit for long periods - no longer than 1 hour  Eat 3 meals day,   1-2  snacks a day Space meals 4-6 hours apart Include 1 serving of protein with each meal and when eating fruit for a snack Eat 1 serving of fruit at a time Avoid sugar sweetened drinks (soda, tea)  Carry fast acting glucose and a snack at all times Hold insulin pen in place for 5-10 seconds after injection Rotate injection sites

## 2022-05-26 NOTE — Progress Notes (Signed)
Diabetes Self-Management Education  Visit Type: Follow-up  Appt. Start Time: 1530 Appt. End Time: 1600  05/26/2022  [name], identified by name and date of birth, is a 52 y.o. female with a diagnosis of Diabetes: Type 2.   ASSESSMENT  Blood pressure (!) 140/76, weight 245 lb 3.2 oz (111.2 kg). Body mass index is 43.44 kg/m.   Diabetes Self-Management Education - 05/26/22 1607       Visit Information   Visit Type Follow-up      Initial Visit   Diabetes Type Type 2      Complications   How often do you check your blood sugar? 1-2 times/day    Fasting Blood glucose range (mg/dL) 70-129;180-200;>200   FBG's 119-372 mg/dL   Postprandial Blood glucose range (mg/dL) >200   pp's 245-342 mg/dL   Have you had a dilated eye exam in the past 12 months? Yes    Have you had a dental exam in the past 12 months? No   no dental insurance   Are you checking your feet? No      Dietary Intake   Breakfast see 3 Day Food Record - most meals don't include protein and include more than 3 servings of carbohydrates    Beverage(s) water, soda, black coffee, diet green tea      Activity / Exercise   Activity / Exercise Type ADL's      Patient Education   Disease Pathophysiology Explored patient's options for treatment of their diabetes    Healthy Eating Role of diet in the treatment of diabetes and the relationship between the three main macronutrients and blood glucose level;Food label reading, portion sizes and measuring food.;Reviewed blood glucose goals for pre and post meals and how to evaluate the patients' food intake on their blood glucose level.;Meal timing in regards to the patients' current diabetes medication.    Being Active Role of exercise on diabetes management, blood pressure control and cardiac health.    Medications Taught/reviewed insulin/injectables, injection, site rotation, insulin/injectables storage and needle disposal.;Reviewed patients medication for diabetes,  action, purpose, timing of dose and side effects.    Monitoring Purpose and frequency of SMBG.;Taught/discussed recording of test results and interpretation of SMBG.;Identified appropriate SMBG and/or A1C goals.   Pt reports that her insurance won't cover Dexcom CGM. Discussed coverage for [employer]. She will check.   Acute complications Taught prevention, symptoms, and  treatment of hypoglycemia - the 15 rule.    Chronic complications Relationship between chronic complications and blood glucose control    Diabetes Stress and Support Identified and addressed patients feelings and concerns about diabetes      Individualized Goals (developed by patient)   Nutrition Follow meal plan discussed    Physical Activity Exercise 3-5 times per week;15 minutes per day    Medications take my medication as prescribed    Monitoring  Test my blood glucose as discussed      Post-Education Assessment   Patient understands the diabetes disease and treatment process. Comprehends key points    Patient understands incorporating nutritional management into lifestyle. Needs Review    Patient undertands incorporating physical activity into lifestyle. Needs Review    Patient understands using medications safely. Comphrehends key points    Patient understands monitoring blood glucose, interpreting and using results Demonstrates understanding / competency    Patient understands prevention, detection, and treatment of acute complications. Comprehends key points    Patient understands prevention, detection, and treatment of chronic complications. Demonstrates understanding /  competency    Patient understands how to develop strategies to address psychosocial issues. Comprehends key points    Patient understands how to develop strategies to promote health/change behavior. Comprehends key points      Outcomes   Expected Outcomes Demonstrated interest in learning. Expect positive outcomes    Program Status Completed       Subsequent Visit   Since your last visit have you continued or begun to take your medications as prescribed? Yes    Since your last visit have you had your blood pressure checked? No    Since your last visit have you experienced any weight changes? Gain    Weight Gain (lbs) 2.3    Since your last visit, are you checking your blood glucose at least once a day? Yes         Individualized Plan for Diabetes Self-Management Training:   Learning Objective:  Patient will have a greater understanding of diabetes self-management. Patient education plan is to attend individual and/or group sessions per assessed needs and concerns.   Plan:   Patient Instructions  Check blood sugars 2 x day before breakfast and 2 hrs after supper every day  Exercise: Walk in the house  for    10  minutes   5 days a week and gradually increase to 30 minutes 5 x week Don't sit for long periods - no longer than 1 hour  Eat 3 meals day,   1-2  snacks a day Space meals 4-6 hours apart Include 1 serving of protein with each meal and when eating fruit for a snack Eat 1 serving of fruit at a time Avoid sugar sweetened drinks (soda, tea)  Carry fast acting glucose and a snack at all times Hold insulin pen in place for 5-10 seconds after injection Rotate injection sites  Expected Outcomes:  Demonstrated interest in learning. Expect positive outcomes  Education material provided:  Planning a Balanced Meal Diabetes Plate 9 inch and portions  If problems or questions, patient to contact team via:   Johny Drilling, RN, [place], [place] [phone]  Future DSME appointment:  PRN

## 2022-06-03 ENCOUNTER — Other Ambulatory Visit: Payer: Self-pay | Admitting: Internal Medicine

## 2022-06-03 DIAGNOSIS — Z794 Long term (current) use of insulin: Secondary | ICD-10-CM

## 2022-06-03 DIAGNOSIS — K219 Gastro-esophageal reflux disease without esophagitis: Secondary | ICD-10-CM

## 2022-06-03 DIAGNOSIS — E119 Type 2 diabetes mellitus without complications: Secondary | ICD-10-CM

## 2022-06-03 NOTE — Telephone Encounter (Signed)
Requested Prescriptions  Pending Prescriptions Disp Refills   omeprazole (PRILOSEC) 10 MG capsule [Pharmacy Med Name: OMEPRAZOLE 10MG  CAPSULES] 90 capsule 0    Sig: TAKE 1 CAPSULE BY MOUTH DAILY     Gastroenterology: Proton Pump Inhibitors Passed - 06/03/2022  7:08 AM      Passed - Valid encounter within last 12 months    Recent Outpatient Visits           2 months ago Type 2 diabetes mellitus with diabetic polyneuropathy, with long-term current use of insulin ([place])   [facility] [place], Coralie Keens, NP   2 months ago Viral URI with cough   [facility] [place], [place] W, NP   5 months ago Type 2 diabetes mellitus with diabetic polyneuropathy, with long-term current use of insulin [facility])   [facility] [place], [place] W, NP   9 months ago Acute cystitis without hematuria   [facility] [place], Coralie Keens, NP   1 year ago Encounter for general adult medical examination with abnormal findings   [facility] [place], Coralie Keens, NP       Future Appointments             In 2 weeks Garnette Gunner, Coralie Keens, NP [facility]             ezetimibe (ZETIA) 10 MG tablet [Pharmacy Med Name: EZETIMIBE 10MG  TABLETS] 90 tablet 0    Sig: TAKE 1 TABLET(10 MG) BY MOUTH DAILY     Cardiovascular:  Antilipid - Sterol Transport Inhibitors Failed - 06/03/2022  7:08 AM      Failed - Lipid Panel in normal range within the last 12 months    Cholesterol, Total  Date Value Ref Range Status  09/07/2019 138 100 - 199 mg/dL Final   Cholesterol  Date Value Ref Range Status  03/23/2022 167 <200 mg/dL Final   Cholesterol Piccolo, Waived  Date Value Ref Range Status  04/23/2017 176 <200 mg/dL Final    Comment:                            Desirable                <200                         Borderline High      200- 239                         High                      >239    LDL Cholesterol (Calc)  Date Value Ref Range Status  03/23/2022 69 mg/dL (calc) Final    Comment:    Reference range: <100 . Desirable range <100 mg/dL for primary prevention;   <70 mg/dL for patients with CHD or diabetic patients  with > or = 2 CHD risk factors. Marland Kitchen LDL-C is now calculated using the Martin-Hopkins  calculation, which is a validated novel method providing  better accuracy than the Friedewald equation in the  estimation of LDL-C.  Cresenciano Genre et al. Annamaria Helling. 6010;932(35): 2061-2068  (http://education.[employer].com/faq/FAQ164)    HDL  Date Value Ref Range Status  03/23/2022 50 > OR =  50 mg/dL Final  09/07/2019 55 >39 mg/dL Final   Triglycerides  Date Value Ref Range Status  03/23/2022 397 (H) <150 mg/dL Final    Comment:    . If a non-fasting specimen was collected, consider repeat triglyceride testing on a fasting specimen if clinically indicated.  Yates Decamp et al. J. of Clin. Lipidol. [id number]. .    Triglycerides Piccolo,Waived  Date Value Ref Range Status  04/23/2017 253 (H) <150 mg/dL Final    Comment:                            Normal                   <150                         Borderline High     150 - 199                         High                200 - 499                         Very High                >499          Passed - AST in normal range and within 360 days    AST  Date Value Ref Range Status  03/23/2022 17 10 - 35 U/L Final   AST (SGOT) Piccolo, Waived  Date Value Ref Range Status  04/23/2017 24 11 - 38 U/L Final         Passed - ALT in normal range and within 360 days    ALT  Date Value Ref Range Status  03/23/2022 21 6 - 29 U/L Final   ALT (SGPT) Piccolo, Waived  Date Value Ref Range Status  04/23/2017 35 10 - 47 U/L Final         Passed - Patient is not pregnant      Passed - Valid encounter within last 12 months    Recent Outpatient Visits           2 months ago Type 2 diabetes  mellitus with diabetic polyneuropathy, with long-term current use of insulin ([place])   [facility] [place], [place], NP   2 months ago Viral URI with cough   [facility] [place], [place], NP   5 months ago Type 2 diabetes mellitus with diabetic polyneuropathy, with long-term current use of insulin [facility])   [facility] [place], [place] W, NP   9 months ago Acute cystitis without hematuria   [facility] [place], Coralie Keens, NP   1 year ago Encounter for general adult medical examination with abnormal findings   [facility] [place], Coralie Keens, NP       Future Appointments             In 2 weeks Garnette Gunner, Coralie Keens, NP [facility]             metFORMIN (GLUCOPHAGE) 500 MG tablet [Pharmacy Med Name: METFORMIN 500MG  TABLETS] 360 tablet 0    Sig: TAKE 2 TABLETS(1000 MG)  BY MOUTH TWICE DAILY WITH A MEAL     Endocrinology:  Diabetes - Biguanides Failed - 06/03/2022  7:08 AM      Failed - HBA1C is between 0 and 7.9 and within 180 days    Hemoglobin A1C  Date Value Ref Range Status  03/23/2022 9.0 (A) 4.0 - 5.6 % Final   HB A1C (BAYER DCA - WAIVED)  Date Value Ref Range Status  10/22/2017 6.9 <7.0 % Final    Comment:                                          Diabetic Adult            <7.0                                       Healthy Adult        4.3 - 5.7                                                           (DCCT/NGSP) American Diabetes Association's Summary of Glycemic Recommendations for Adults with Diabetes: Hemoglobin A1c <7.0%. More stringent glycemic goals (A1c <6.0%) may further reduce complications at the cost of increased risk of hypoglycemia.    Hgb A1c MFr Bld  Date Value Ref Range Status  05/21/2021 7.6 (H) <5.7 % of total Hgb Final    Comment:    For someone without known diabetes, a hemoglobin A1c value of  6.5% or greater indicates that they may have  diabetes and this should be confirmed with a follow-up  test. . For someone with known diabetes, a value <7% indicates  that their diabetes is well controlled and a value  greater than or equal to 7% indicates suboptimal  control. A1c targets should be individualized based on  duration of diabetes, age, comorbid conditions, and  other considerations. . Currently, no consensus exists regarding use of hemoglobin A1c for diagnosis of diabetes for children. .          Failed - B12 Level in normal range and within 720 days    Vitamin B-12  Date Value Ref Range Status  06/01/2017 578 232 - 1,245 pg/mL Final         Failed - CBC within normal limits and completed in the last 12 months    WBC  Date Value Ref Range Status  12/17/2021 5.4 3.8 - 10.8 Thousand/uL Final   RBC  Date Value Ref Range Status  12/17/2021 4.42 3.80 - 5.10 Million/uL Final   Hemoglobin  Date Value Ref Range Status  12/17/2021 13.1 11.7 - 15.5 g/dL Final  09/38/1829 93.7 11.1 - 15.9 g/dL Final   HCT  Date Value Ref Range Status  12/17/2021 39.3 35.0 - 45.0 % Final   Hematocrit  Date Value Ref Range Status  04/28/2018 39.6 34.0 - 46.6 % Final   MCHC  Date Value Ref Range Status  12/17/2021 33.3 32.0 - 36.0 g/dL Final   [facility]  Date Value Ref Range Status  12/17/2021 29.6 27.0 - 33.0 pg Final   MCV  Date Value  Ref Range Status  12/17/2021 88.9 80.0 - 100.0 fL Final  04/28/2018 86 79 - 97 fL Final   No results found for: "PLTCOUNTKUC", "LABPLAT", "POCPLA" RDW  Date Value Ref Range Status  12/17/2021 15.0 11.0 - 15.0 % Final  04/28/2018 15.1 12.3 - 15.4 % Final    Comment:    **Effective May 02, 2018, the RDW pediatric reference**   interval will be removed and the adult reference interval   will be changing to:                             Female 11.7 - 15.4                                                      Female 11.6 - 15.4           Passed - Cr in normal range and within 360 days    Creat  Date Value Ref Range Status  03/23/2022 0.60 0.50 - 0.99 mg/dL Final   Creatinine, Urine  Date Value Ref Range Status  03/23/2022 26 20 - 275 mg/dL Final         Passed - eGFR in normal range and within 360 days    GFR calc Af Amer  Date Value Ref Range Status  09/07/2019 88 >59 mL/min/1.73 Final    Comment:    **[facility] currently reports eGFR in compliance with the current**   recommendations of the [employer]. [facility] will   update reporting as new guidelines are published from the NKF-ASN   Task force.    GFR calc non Af Amer  Date Value Ref Range Status  09/07/2019 76 >59 mL/min/1.73 Final   eGFR  Date Value Ref Range Status  12/17/2021 104 > OR = 60 mL/min/1.73m2 Final         Passed - Valid encounter within last 6 months    Recent Outpatient Visits           2 months ago Type 2 diabetes mellitus with diabetic polyneuropathy, with long-term current use of insulin [facility])   [facility] [place], [place], NP   2 months ago Viral URI with cough   [facility] [place], [place], NP   5 months ago Type 2 diabetes mellitus with diabetic polyneuropathy, with long-term current use of insulin [facility])   [facility] [place], [place], NP   9 months ago Acute cystitis without hematuria   [facility] [place], Coralie Keens, NP   1 year ago Encounter for general adult medical examination with abnormal findings   [facility] [place], Coralie Keens, NP       Future Appointments             In 2 weeks Garnette Gunner, Coralie Keens, NP [facility]             glipiZIDE (GLUCOTROL) 10 MG tablet [Pharmacy Med Name: GLIPIZIDE 10MG  TABLETS] 180 tablet 0    Sig: TAKE 1 TABLET(10 MG) BY MOUTH TWICE DAILY BEFORE A MEAL     Endocrinology:  Diabetes - Sulfonylureas Failed -  06/03/2022  7:08 AM  Failed - HBA1C is between 0 and 7.9 and within 180 days    Hemoglobin A1C  Date Value Ref Range Status  03/23/2022 9.0 (A) 4.0 - 5.6 % Final   HB A1C (BAYER DCA - WAIVED)  Date Value Ref Range Status  10/22/2017 6.9 <7.0 % Final    Comment:                                          Diabetic Adult            <7.0                                       Healthy Adult        4.3 - 5.7                                                           (DCCT/NGSP) American Diabetes Association's Summary of Glycemic Recommendations for Adults with Diabetes: Hemoglobin A1c <7.0%. More stringent glycemic goals (A1c <6.0%) may further reduce complications at the cost of increased risk of hypoglycemia.    Hgb A1c MFr Bld  Date Value Ref Range Status  05/21/2021 7.6 (H) <5.7 % of total Hgb Final    Comment:    For someone without known diabetes, a hemoglobin A1c value of 6.5% or greater indicates that they may have  diabetes and this should be confirmed with a follow-up  test. . For someone with known diabetes, a value <7% indicates  that their diabetes is well controlled and a value  greater than or equal to 7% indicates suboptimal  control. A1c targets should be individualized based on  duration of diabetes, age, comorbid conditions, and  other considerations. . Currently, no consensus exists regarding use of hemoglobin A1c for diagnosis of diabetes for children. .          Passed - Cr in normal range and within 360 days    Creat  Date Value Ref Range Status  03/23/2022 0.60 0.50 - 0.99 mg/dL Final   Creatinine, Urine  Date Value Ref Range Status  03/23/2022 26 20 - 275 mg/dL Final         Passed - Valid encounter within last 6 months    Recent Outpatient Visits           2 months ago Type 2 diabetes mellitus with diabetic polyneuropathy, with long-term current use of insulin [facility])   [facility] [place], [place], NP   2 months ago  Viral URI with cough   [facility] [place], [place], NP   5 months ago Type 2 diabetes mellitus with diabetic polyneuropathy, with long-term current use of insulin [facility])   [facility] [place], [place], NP   9 months ago Acute cystitis without hematuria   [facility] [place], Coralie Keens, NP   1 year ago Encounter for general adult medical examination with abnormal findings   [facility] [place], Coralie Keens, NP  Future Appointments             In 2 weeks Baity, Coralie Keens, NP [facility]

## 2022-06-04 ENCOUNTER — Ambulatory Visit (INDEPENDENT_AMBULATORY_CARE_PROVIDER_SITE_OTHER): Payer: Medicare Other

## 2022-06-04 VITALS — BP 129/76 | HR 86 | Temp 98.3°F | Ht 63.0 in | Wt 242.2 lb

## 2022-06-04 DIAGNOSIS — F251 Schizoaffective disorder, depressive type: Secondary | ICD-10-CM

## 2022-06-04 NOTE — Progress Notes (Signed)
Patient in today for due every 28 day Invega Sustenna 234 mg/1.5 ml injection. Patient presented clean and answered questions. Patient did mentioned that she felt she could tell it was time for her injection she stated that she experienced some anxiety and depression. Patient denied any auditory or visual hallucinations, no suicidal or homicidal ideations or plans or intent to harm self or others. Patient reported still doing fine with every 28 day dosage and had no complaints or concerns.   Injection given in right upper out quant area. Patient tolerated injection without any complaint of pain or discomfort and agreed to return in 28 days for next due injection.    Patient to call if any issues or problems after injection today or prior to next appointment     NDC 50458-564-01   EXP 10/25/2023   GTIN [id number]   S/N [id number]   LOT [id number] 

## 2022-06-04 NOTE — Patient Instructions (Signed)
Patient in today for due every 28 day Invega Sustenna 234 mg/1.5 ml injection. Patient presented clean and answered questions. Patient did mentioned that she felt she could tell it was time for her injection she stated that she experienced some anxiety and depression. Patient denied any auditory or visual hallucinations, no suicidal or homicidal ideations or plans or intent to harm self or others. Patient reported still doing fine with every 28 day dosage and had no complaints or concerns.   Injection given in right upper out quant area. Patient tolerated injection without any complaint of pain or discomfort and agreed to return in 28 days for next due injection.    Patient to call if any issues or problems after injection today or prior to next appointment     Benoit   EXP 10/25/2023   GTIN [id number]   S/N [id number]   LOT [id number]

## 2022-06-13 NOTE — Progress Notes (Unsigned)
[facility] MD/PA/NP OP Progress Note  06/16/2022 11:53 AM Tabitha [name]  MRN:  [id number]  Chief Complaint:  Chief Complaint  Patient presents with   Follow-up   HPI:  This is a follow-up appointment for schizoaffective disorder, anxiety.  She states that she is not doing well.  She has paranoia about her kids and her husband.  She feels that they are going to do something to hurt her.  It is better when she goes out, but it is worse when she is at home.  She enjoys watching movies, and having a chat with her family at the dinner.  She continues to bring her kids to school.  She continues to go to church.  She feels paranoid there as well.  She thought that people were behind her or talking about her.  She also has AH show family voice of calling her "honey," or "mom."  She denies CAH or VH.  She denies alcohol use or drug use. The patient has mood symptoms as in PHQ-9/GAD-7.  She sleeps up to 8 hours and takes a nap.  She is willing to stay active.  She is concerned that higher dose of olanzapine may cause more drowsiness, and would like to stay on the same medication regimen at this time.   Wt Readings from Last 3 Encounters:  06/16/22 242 lb (109.8 kg)  06/04/22 242 lb 3.2 oz (109.9 kg)  05/26/22 245 lb 3.2 oz (111.2 kg)   05/10/21 246 lb 12.8 oz (111.9 kg)  02/23/22 241 lb 6.4 oz (109.5 kg)  01/22/22 242 lb (109.8 kg)     Visit Diagnosis:    ICD-10-CM   1. Schizoaffective disorder, depressive type ([place])  F25.1 Hepatic function panel    2. Obsessive-compulsive disorder, unspecified type  F42.9     3. Anxiety state  F41.1       Past Psychiatric History: Please see initial evaluation for full details. I have reviewed the history. No updates at this time.     Past Medical History:  Past Medical History:  Diagnosis Date   Allergic rhinitis    Anxiety    Asthma    Bipolar disorder ([place])    Depression    Diabetes mellitus without complication (HCC)    GERD (gastroesophageal reflux  disease)    Hyperlipidemia    Hypertension    Mild sleep apnea    OCD (obsessive compulsive disorder)    Palpitations    Paranoia ([place])    Schizoaffective disorder ([place])    Sleep apnea     Past Surgical History:  Procedure Laterality Date   CESAREAN SECTION  2006/2008   CHOLECYSTECTOMY  2006    Family Psychiatric History: Please see initial evaluation for full details. I have reviewed the history. No updates at this time.     Family History:  Family History  Problem Relation Age of Onset   Lupus Mother    Diabetes Mother        pre diabetes   Diabetes Father    Hyperlipidemia Father    Diabetes Brother    Depression Paternal Grandmother    Bipolar disorder Paternal Grandmother     Social History:  Social History   Socioeconomic History   Marital status: Married    Spouse name: Tabitha [name]   Number of children: 2   Years of education: 16   Highest education level: Bachelor's degree (e.g., BA, AB, BS)  Occupational History   Occupation: disability  Tobacco Use  Smoking status: Never   Smokeless tobacco: Never  Vaping Use   Vaping Use: Never used  Substance and Sexual Activity   Alcohol use: Not Currently   Drug use: Never   Sexual activity: Not Currently    Partners: Male    Birth control/protection: Surgical  Other Topics Concern   Not on file  Social History Narrative   Not on file   Social Determinants of Health   Financial Resource Strain: Low Risk  (09/11/2021)   Overall Financial Resource Strain (CARDIA)    Difficulty of Paying Living Expenses: Not hard at all  Food Insecurity: No Food Insecurity (09/11/2021)   Hunger Vital Sign    Worried About Running Out of Food in the Last Year: Never true    [place] in the Last Year: Never true  Transportation Needs: No Transportation Needs (09/11/2021)   PRAPARE - Hydrologist (Medical): No    Lack of Transportation (Non-Medical): No  Physical Activity: Inactive  (09/11/2021)   Exercise Vital Sign    Days of Exercise per Week: 0 days    Minutes of Exercise per Session: 0 min  Stress: Stress Concern Present (09/11/2021)   [place]    Feeling of Stress : Rather much  Social Connections: Socially Integrated (09/11/2021)   Social Connection and Isolation Panel [NHANES]    Frequency of Communication with Friends and Family: Once a week    Frequency of Social Gatherings with Friends and Family: More than three times a week    Attends Religious Services: More than 4 times per year    Active Member of [employer] or Organizations: Yes    Attends Music therapist: More than 4 times per year    Marital Status: Married    Allergies:  Allergies  Allergen Reactions   Abilify [Aripiprazole] Hives    Metabolic Disorder Labs: Lab Results  Component Value Date   HGBA1C 9.0 (A) 03/23/2022   MPG 171 05/21/2021   MPG 166 01/08/2021   Lab Results  Component Value Date   PROLACTIN 37.8 (H) 01/22/2022   PROLACTIN 84.8 (H) 06/01/2017   Lab Results  Component Value Date   CHOL 167 03/23/2022   TRIG 397 (H) 03/23/2022   HDL 50 03/23/2022   CHOLHDL 3.3 03/23/2022   VLDL 51 (H) 04/23/2017   LDLCALC 69 03/23/2022   LDLCALC  12/17/2021     Comment:     . LDL cholesterol not calculated. Triglyceride levels greater than 400 mg/dL invalidate calculated LDL results. . Reference range: <100 . Desirable range <100 mg/dL for primary prevention;   <70 mg/dL for patients with CHD or diabetic patients  with > or = 2 CHD risk factors. Marland Kitchen LDL-C is now calculated using the Martin-Hopkins  calculation, which is a validated novel method providing  better accuracy than the Friedewald equation in the  estimation of LDL-C.  Cresenciano Genre et al. Annamaria Helling. [id number]): 2061-2068  (http://education.[employer].com/faq/FAQ164)    Lab Results  Component Value Date   TSH 1.026 01/22/2022   TSH  1.56 12/17/2021    Therapeutic Level Labs: No results found for: "LITHIUM" Lab Results  Component Value Date   VALPROATE 46 (L) 01/22/2022   VALPROATE 46.0 (L) 12/17/2021   No results found for: "CBMZ"  Current Medications: Current Outpatient Medications  Medication Sig Dispense Refill   ACCU-CHEK GUIDE test strip USE AS DIRECTED TO TEST TWICE DAILY 100 strip 2  albuterol (VENTOLIN HFA) 108 (90 Base) MCG/ACT inhaler INHALE 2 PUFFS INTO THE LUNGS EVERY 6 HOURS AS NEEDED FOR WHEEZING OR SHORTNESS OF BREATH 25.5 g 3   atorvastatin (LIPITOR) 40 MG tablet Take 1 tablet (40 mg total) by mouth daily. 90 tablet 1   EPINEPHrine 0.3 mg/0.3 mL IJ SOAJ injection Inject 0.3 mg into the muscle as needed for anaphylaxis. 1 each 0   ezetimibe (ZETIA) 10 MG tablet TAKE 1 TABLET(10 MG) BY MOUTH DAILY 90 tablet 0   fluticasone (FLOVENT HFA) 110 MCG/ACT inhaler Inhale 1 puff into the lungs 2 (two) times daily. 1 Inhaler 11   gabapentin (NEURONTIN) 600 MG tablet Take 1 tablet (600 mg total) by mouth 3 (three) times daily. 270 tablet 0   glipiZIDE (GLUCOTROL) 10 MG tablet TAKE 1 TABLET(10 MG) BY MOUTH TWICE DAILY BEFORE A MEAL 180 tablet 0   ibuprofen (ADVIL) 200 MG tablet Take 200 mg by mouth every 6 (six) hours as needed.     insulin degludec (TRESIBA FLEXTOUCH) 100 UNIT/ML FlexTouch Pen ADMINISTER 40 UNITS UNDER THE SKIN EVERY DAY 15 mL 2   Insulin Pen Needle (BD PEN NEEDLE NANO U/F) 32G X 4 MM MISC 1 each by Does not apply route daily. 100 each 2   INVEGA SUSTENNA 234 MG/1.5ML injection ADMINISTER 1.5 ML IN THE MUSCLE EVERY 28 DAYS 1.5 mL 11   MELATONIN PO Take 5 mg by mouth at bedtime as needed.     metFORMIN (GLUCOPHAGE) 500 MG tablet TAKE 2 TABLETS(1000 MG) BY MOUTH TWICE DAILY WITH A MEAL 360 tablet 0   omeprazole (PRILOSEC) 10 MG capsule TAKE 1 CAPSULE BY MOUTH DAILY 90 capsule 0   Vilazodone HCl (VIIBRYD) 40 MG TABS Take 1 tablet (40 mg total) by mouth daily. 90 tablet 1   [START ON 07/19/2022]  divalproex (DEPAKOTE ER) 500 MG 24 hr tablet Take 1 tablet (500 mg total) by mouth daily. 90 tablet 0   [START ON 07/20/2022] OLANZapine (ZYPREXA) 7.5 MG tablet Take 1 tablet (7.5 mg total) by mouth at bedtime. 90 tablet 0   Current Facility-Administered Medications  Medication Dose Route Frequency Provider Last Rate Last Admin   paliperidone (INVEGA SUSTENNA) injection 234 mg  234 mg Intramuscular Q28 days Norman Clay, MD   234 mg at 06/04/22 1105     Musculoskeletal: Strength & Muscle Tone: within normal limits Gait & Station: normal Patient leans: N/A  Psychiatric Specialty Exam: Review of Systems  Psychiatric/Behavioral:  Positive for decreased concentration, dysphoric mood, hallucinations and sleep disturbance. Negative for agitation, behavioral problems, confusion, self-injury and suicidal ideas. The patient is nervous/anxious. The patient is not hyperactive.   All other systems reviewed and are negative.   Blood pressure 130/71, pulse 98, temperature 97.8 F (36.6 C), height 5' 2"$  (1.575 m), weight 242 lb (109.8 kg), SpO2 97 %.Body mass index is 44.26 kg/m.  General Appearance: Fairly Groomed  Eye Contact:  Fair  Speech:  Clear and Coherent  Volume:  Normal  Mood:  Anxious  Affect:  Appropriate, Congruent, and anxious  Thought Process:  Coherent  Orientation:  Full (Time, Place, and Person)  Thought Content: Logical and Paranoid Ideation   Suicidal Thoughts:  No  Homicidal Thoughts:  No  Memory:  Immediate;   Good  Judgement:  Good  Insight:  Good  Psychomotor Activity:  Normal, Normal tone, no rigidity, no resting/postural tremors, no tardive dyskinesia    Concentration:  Concentration: Good and Attention Span: Good  Recall:  Good  Fund of Knowledge: Good  Language: Good  Akathisia:  No  Handed:  Right  AIMS (if indicated): not done  Assets:  Communication Skills Desire for Improvement  ADL's:  Intact  Cognition: WNL  Sleep:   hypersomnia   Screenings: [place] Office Visit from 05/12/2017 in [place] Office Visit from 07/29/2016 in [place] Office Visit from 05/06/2016 in [place] Office Visit from 04/01/2016 in [place] Office Visit from 03/28/2015 in [place] Total Score 0 0 0 0 0      [place] Office Visit from 06/16/2022 in [place] Office Visit from 05/07/2022 in [place] Office Visit from 03/23/2022 in [facility] Office Visit from 03/13/2022 in [facility] Office Visit from 03/10/2022 in [facility]  Total GAD-7 Score 6 19 8 7 9      $ [place] Office Visit from 09/07/2019 in [place]  Total Score (max 30 points ) 16      PHQ2-9    [place] Visit from 06/16/2022 in [facility] [place] Office Visit from 05/07/2022 in [facility] from 03/31/2022 in [place] at [facility] Visit from 03/23/2022 in [facility] Office Visit from 03/13/2022 in [facility]  PHQ-2 Total Score 2 2 3 2 2  $ PHQ-9 Total Score 9 17 12 12 8      $ [place] Office Visit from 06/16/2022 in [place] Office Visit from 05/07/2022 in [place] Office Visit from 01/22/2022 in [place] No Risk No Risk No Risk        Assessment and Plan:  SANARI AKHTER is a 52 y.o. year old female with a history of schizoaffective disorder,  sleep apnea (using CPAP machine), diabetes, hypertension, hyperlipidemia, GERD, who presents for follow up appointment for below.    1. Schizoaffective disorder, depressive type ([place]) 2. Obsessive-compulsive disorder, unspecified type 3. Anxiety state Acute stressors include:  Other stressors include: her husband being registered as a sex offender    History: no psych admission   There has been significant worsening in paranoia since the last visit.  Although she was advised to consider adjustment of olanzapine after obtaining EKG, she prefers to stay on the same medication regimen while working on behavioral activation.  Will continue Invega and olanzapine to target schizoaffective disorder. She benefit from using two antipsychotics, considering she had treatment-resistant hallucinations while on Invega injection.  Will continue Depakote for mood dysregulation.  Will continue Viibryd to target depression and anxiety.  Will continue gabapentin for anxiety.    # prolactin  The level has been trending down.  She denies symptoms of galactorrhea.  Will continue to monitor this.    Plan Continue monthly Invega 234 mg IM - monitor weight gain. She is on metformin.  Continue olanzapine 7.5 mg at night - monitor weight gain Continue Depakote 500 mg daily (VPA 46 11/2021, LFT wnl 09/2021, Plt wnl 11/2021) Continue Viibryd 40 mg daily  Continue gabapentin 600 mg 3 times a day Obtain labs (CMP, LFT, VPA) Obtain EKG Next appointment- 4/22 at 9:30. In person  - She [facility] PCP at [facility]   Past trials of medication: Abilify (hives), olanzapine, quetiapine, risperidone (stopped working),             Ambulance person of Care: Collaboration of Care: Other reviewed notes in Epic  Patient/Guardian was advised Release of Information must be obtained prior to any record release in order to collaborate their care  with an outside provider. Patient/Guardian was advised if they have not already done so to contact the registration department to sign all necessary forms in order for [place] to release information regarding their care.   Consent: Patient/Guardian gives verbal consent for treatment and assignment of benefits for services provided during this visit. Patient/Guardian expressed understanding and agreed to proceed.    Norman Clay, MD 06/16/2022, 11:53 AM

## 2022-06-16 ENCOUNTER — Encounter: Payer: Self-pay | Admitting: Psychiatry

## 2022-06-16 ENCOUNTER — Other Ambulatory Visit
Admission: RE | Admit: 2022-06-16 | Discharge: 2022-06-16 | Disposition: A | Discharge status: Home / Self Care | Payer: Medicare Other | Attending: Psychiatry | Admitting: Psychiatry

## 2022-06-16 ENCOUNTER — Ambulatory Visit (INDEPENDENT_AMBULATORY_CARE_PROVIDER_SITE_OTHER): Payer: Medicare Other | Admitting: Psychiatry

## 2022-06-16 VITALS — BP 130/71 | HR 98 | Temp 97.8°F | Ht 62.0 in | Wt 242.0 lb

## 2022-06-16 DIAGNOSIS — F429 Obsessive-compulsive disorder, unspecified: Secondary | ICD-10-CM | POA: Diagnosis not present

## 2022-06-16 DIAGNOSIS — F251 Schizoaffective disorder, depressive type: Secondary | ICD-10-CM

## 2022-06-16 DIAGNOSIS — F411 Generalized anxiety disorder: Secondary | ICD-10-CM

## 2022-06-16 LAB — CBC
HCT: 37.3 % (ref 36.0–46.0)
Hemoglobin: 12.8 g/dL (ref 12.0–15.0)
MCH: 29.2 pg (ref 26.0–34.0)
MCHC: 34.3 g/dL (ref 30.0–36.0)
MCV: 85.2 fL (ref 80.0–100.0)
Platelets: 183 10*3/uL (ref 150–400)
RBC: 4.38 MIL/uL (ref 3.87–5.11)
RDW: 14.1 % (ref 11.5–15.5)
WBC: 7.6 10*3/uL (ref 4.0–10.5)
nRBC: 0 % (ref 0.0–0.2)

## 2022-06-16 LAB — HEPATIC FUNCTION PANEL
ALT: 27 U/L (ref 0–44)
AST: 35 U/L (ref 15–41)
Albumin: 3.9 g/dL (ref 3.5–5.0)
Alkaline Phosphatase: 98 U/L (ref 38–126)
Bilirubin, Direct: 0.1 mg/dL (ref 0.0–0.2)
Indirect Bilirubin: 0.7 mg/dL (ref 0.3–0.9)
Total Bilirubin: 0.8 mg/dL (ref 0.3–1.2)
Total Protein: 6.8 g/dL (ref 6.5–8.1)

## 2022-06-16 LAB — VALPROIC ACID LEVEL: Valproic Acid Lvl: 13 ug/mL — ABNORMAL LOW (ref 50.0–100.0)

## 2022-06-16 MED ORDER — OLANZAPINE 7.5 MG PO TABS: 7.5000 mg | ORAL_TABLET | Freq: Every day | ORAL | 0 refills | Status: DC

## 2022-06-16 MED ORDER — DIVALPROEX SODIUM ER 500 MG PO TB24: 500.0000 mg | ORAL_TABLET | Freq: Every day | ORAL | 0 refills | Status: DC

## 2022-06-16 NOTE — Patient Instructions (Signed)
Continue monthly Invega 234 mg IM  Continue olanzapine 7.5 mg at night Continue Depakote 500 mg daily  Continue Viibryd 40 mg daily Continue gabapentin 600 mg 3 times a day Obtain labs (CMP, LFT, VPA) Obtain EKG Next appointment- 4/22 at 9:30

## 2022-06-23 ENCOUNTER — Ambulatory Visit (INDEPENDENT_AMBULATORY_CARE_PROVIDER_SITE_OTHER): Payer: Medicare Other | Admitting: Internal Medicine

## 2022-06-23 ENCOUNTER — Other Ambulatory Visit (HOSPITAL_COMMUNITY)
Admission: RE | Admit: 2022-06-23 | Discharge: 2022-06-23 | Disposition: A | Discharge status: Home / Self Care | Payer: Medicare Other | Source: Ambulatory Visit | Attending: Internal Medicine | Admitting: Internal Medicine

## 2022-06-23 ENCOUNTER — Encounter: Payer: Self-pay | Admitting: Internal Medicine

## 2022-06-23 VITALS — BP 132/64 | HR 76 | Temp 96.8°F | Ht 62.0 in | Wt 244.0 lb

## 2022-06-23 DIAGNOSIS — Z1211 Encounter for screening for malignant neoplasm of colon: Secondary | ICD-10-CM | POA: Diagnosis not present

## 2022-06-23 DIAGNOSIS — Z01419 Encounter for gynecological examination (general) (routine) without abnormal findings: Secondary | ICD-10-CM | POA: Insufficient documentation

## 2022-06-23 DIAGNOSIS — Z124 Encounter for screening for malignant neoplasm of cervix: Secondary | ICD-10-CM | POA: Diagnosis not present

## 2022-06-23 DIAGNOSIS — Z0001 Encounter for general adult medical examination with abnormal findings: Secondary | ICD-10-CM | POA: Diagnosis not present

## 2022-06-23 DIAGNOSIS — Z1151 Encounter for screening for human papillomavirus (HPV): Secondary | ICD-10-CM | POA: Insufficient documentation

## 2022-06-23 DIAGNOSIS — Z1231 Encounter for screening mammogram for malignant neoplasm of breast: Secondary | ICD-10-CM | POA: Diagnosis not present

## 2022-06-23 DIAGNOSIS — E66813 Obesity, class 3: Secondary | ICD-10-CM

## 2022-06-23 DIAGNOSIS — Z6841 Body Mass Index (BMI) 40.0 and over, adult: Secondary | ICD-10-CM

## 2022-06-23 DIAGNOSIS — E1142 Type 2 diabetes mellitus with diabetic polyneuropathy: Secondary | ICD-10-CM

## 2022-06-23 DIAGNOSIS — Z794 Long term (current) use of insulin: Secondary | ICD-10-CM

## 2022-06-23 NOTE — Assessment & Plan Note (Signed)
Encourage diet and exercise for weight loss 

## 2022-06-23 NOTE — Patient Instructions (Signed)
Health Maintenance for Postmenopausal Women Menopause is a normal process in which your ability to get pregnant comes to an end. This process happens slowly over many months or years, usually between the ages of 48 and 55. Menopause is complete when you have missed your menstrual period for 12 months. It is important to talk with your health care provider about some of the most common conditions that affect women after menopause (postmenopausal women). These include heart disease, cancer, and bone loss (osteoporosis). Adopting a healthy lifestyle and getting preventive care can help to promote your health and wellness. The actions you take can also lower your chances of developing some of these common conditions. What are the signs and symptoms of menopause? During menopause, you may have the following symptoms: Hot flashes. These can be moderate or severe. Night sweats. Decrease in sex drive. Mood swings. Headaches. Tiredness (fatigue). Irritability. Memory problems. Problems falling asleep or staying asleep. Talk with your health care provider about treatment options for your symptoms. Do I need hormone replacement therapy? Hormone replacement therapy is effective in treating symptoms that are caused by menopause, such as hot flashes and night sweats. Hormone replacement carries certain risks, especially as you become older. If you are thinking about using estrogen or estrogen with progestin, discuss the benefits and risks with your health care provider. How can I reduce my risk for heart disease and stroke? The risk of heart disease, heart attack, and stroke increases as you age. One of the causes may be a change in the body's hormones during menopause. This can affect how your body uses dietary fats, triglycerides, and cholesterol. Heart attack and stroke are medical emergencies. There are many things that you can do to help prevent heart disease and stroke. Watch your blood pressure High  blood pressure causes heart disease and increases the risk of stroke. This is more likely to develop in people who have high blood pressure readings or are overweight. Have your blood pressure checked: Every 3-5 years if you are 18-39 years of age. Every year if you are 40 years old or older. Eat a healthy diet  Eat a diet that includes plenty of vegetables, fruits, low-fat dairy products, and lean protein. Do not eat a lot of foods that are high in solid fats, added sugars, or sodium. Get regular exercise Get regular exercise. This is one of the most important things you can do for your health. Most adults should: Try to exercise for at least 150 minutes each week. The exercise should increase your heart rate and make you sweat (moderate-intensity exercise). Try to do strengthening exercises at least twice each week. Do these in addition to the moderate-intensity exercise. Spend less time sitting. Even light physical activity can be beneficial. Other tips Work with your health care provider to achieve or maintain a healthy weight. Do not use any products that contain nicotine or tobacco. These products include cigarettes, chewing tobacco, and vaping devices, such as e-cigarettes. If you need help quitting, ask your health care provider. Know your numbers. Ask your health care provider to check your cholesterol and your blood sugar (glucose). Continue to have your blood tested as directed by your health care provider. Do I need screening for cancer? Depending on your health history and family history, you may need to have cancer screenings at different stages of your life. This may include screening for: Breast cancer. Cervical cancer. Lung cancer. Colorectal cancer. What is my risk for osteoporosis? After menopause, you may be   at increased risk for osteoporosis. Osteoporosis is a condition in which bone destruction happens more quickly than new bone creation. To help prevent osteoporosis or  the bone fractures that can happen because of osteoporosis, you may take the following actions: If you are 19-50 years old, get at least 1,000 mg of calcium and at least 600 international units (IU) of vitamin D per day. If you are older than age 50 but younger than age 70, get at least 1,200 mg of calcium and at least 600 international units (IU) of vitamin D per day. If you are older than age 70, get at least 1,200 mg of calcium and at least 800 international units (IU) of vitamin D per day. Smoking and drinking excessive alcohol increase the risk of osteoporosis. Eat foods that are rich in calcium and vitamin D, and do weight-bearing exercises several times each week as directed by your health care provider. How does menopause affect my mental health? Depression may occur at any age, but it is more common as you become older. Common symptoms of depression include: Feeling depressed. Changes in sleep patterns. Changes in appetite or eating patterns. Feeling an overall lack of motivation or enjoyment of activities that you previously enjoyed. Frequent crying spells. Talk with your health care provider if you think that you are experiencing any of these symptoms. General instructions See your health care provider for regular wellness exams and vaccines. This may include: Scheduling regular health, dental, and eye exams. Getting and maintaining your vaccines. These include: Influenza vaccine. Get this vaccine each year before the flu season begins. Pneumonia vaccine. Shingles vaccine. Tetanus, diphtheria, and pertussis (Tdap) booster vaccine. Your health care provider may also recommend other immunizations. Tell your health care provider if you have ever been abused or do not feel safe at home. Summary Menopause is a normal process in which your ability to get pregnant comes to an end. This condition causes hot flashes, night sweats, decreased interest in sex, mood swings, headaches, or lack  of sleep. Treatment for this condition may include hormone replacement therapy. Take actions to keep yourself healthy, including exercising regularly, eating a healthy diet, watching your weight, and checking your blood pressure and blood sugar levels. Get screened for cancer and depression. Make sure that you are up to date with all your vaccines. This information is not intended to replace advice given to you by your health care provider. Make sure you discuss any questions you have with your health care provider. Document Revised: 09/02/2020 Document Reviewed: 09/02/2020 Elsevier Patient Education  2023 [employer]  

## 2022-06-23 NOTE — Progress Notes (Signed)
Subjective:    Patient ID: Tabitha [name], female    DOB: 10-28-1970, 52 y.o.   MRN: [id number]  HPI  Patient presents to clinic today for annual exam.  Flu: 11/2021 Tetanus: 12/2014 Pneumovax: 04/2015 Prevnar 20: 04/2021 COVID: Never Shingrix: Never Pap smear: > 5 years ago Mammogram: > 2 years ago Colon screening: never Vision screening: annually Dentist: as needed  Diet: She does eat meat. She consumes some fruits and veggies. She does eat some fried foods. She drinks mostly coffee, tea, soda, water. Exercise: Walking  Review of Systems     Past Medical History:  Diagnosis Date   Allergic rhinitis    Anxiety    Asthma    Bipolar disorder ([place])    Depression    Diabetes mellitus without complication (HCC)    GERD (gastroesophageal reflux disease)    Hyperlipidemia    Hypertension    Mild sleep apnea    OCD (obsessive compulsive disorder)    Palpitations    Paranoia (HCC)    Schizoaffective disorder (HCC)    Sleep apnea     Current Outpatient Medications  Medication Sig Dispense Refill   ACCU-CHEK GUIDE test strip USE AS DIRECTED TO TEST TWICE DAILY 100 strip 2   albuterol (VENTOLIN HFA) 108 (90 Base) MCG/ACT inhaler INHALE 2 PUFFS INTO THE LUNGS EVERY 6 HOURS AS NEEDED FOR WHEEZING OR SHORTNESS OF BREATH 25.5 g 3   atorvastatin (LIPITOR) 40 MG tablet Take 1 tablet (40 mg total) by mouth daily. 90 tablet 1   [START ON 07/19/2022] divalproex (DEPAKOTE ER) 500 MG 24 hr tablet Take 1 tablet (500 mg total) by mouth daily. 90 tablet 0   EPINEPHrine 0.3 mg/0.3 mL IJ SOAJ injection Inject 0.3 mg into the muscle as needed for anaphylaxis. 1 each 0   ezetimibe (ZETIA) 10 MG tablet TAKE 1 TABLET(10 MG) BY MOUTH DAILY 90 tablet 0   fluticasone (FLOVENT HFA) 110 MCG/ACT inhaler Inhale 1 puff into the lungs 2 (two) times daily. 1 Inhaler 11   gabapentin (NEURONTIN) 600 MG tablet Take 1 tablet (600 mg total) by mouth 3 (three) times daily. 270 tablet 0   glipiZIDE  (GLUCOTROL) 10 MG tablet TAKE 1 TABLET(10 MG) BY MOUTH TWICE DAILY BEFORE A MEAL 180 tablet 0   ibuprofen (ADVIL) 200 MG tablet Take 200 mg by mouth every 6 (six) hours as needed.     insulin degludec (TRESIBA FLEXTOUCH) 100 UNIT/ML FlexTouch Pen ADMINISTER 40 UNITS UNDER THE SKIN EVERY DAY 15 mL 2   Insulin Pen Needle (BD PEN NEEDLE NANO U/F) 32G X 4 MM MISC 1 each by Does not apply route daily. 100 each 2   INVEGA SUSTENNA 234 MG/1.5ML injection ADMINISTER 1.5 ML IN THE MUSCLE EVERY 28 DAYS 1.5 mL 11   MELATONIN PO Take 5 mg by mouth at bedtime as needed.     metFORMIN (GLUCOPHAGE) 500 MG tablet TAKE 2 TABLETS(1000 MG) BY MOUTH TWICE DAILY WITH A MEAL 360 tablet 0   [START ON 07/20/2022] OLANZapine (ZYPREXA) 7.5 MG tablet Take 1 tablet (7.5 mg total) by mouth at bedtime. 90 tablet 0   omeprazole (PRILOSEC) 10 MG capsule TAKE 1 CAPSULE BY MOUTH DAILY 90 capsule 0   Vilazodone HCl (VIIBRYD) 40 MG TABS Take 1 tablet (40 mg total) by mouth daily. 90 tablet 1   Current Facility-Administered Medications  Medication Dose Route Frequency Provider Last Rate Last Admin   paliperidone (INVEGA SUSTENNA) injection 234 mg  234 mg Intramuscular  Q28 days Norman Clay, MD   234 mg at 06/04/22 1105    Allergies  Allergen Reactions   Abilify [Aripiprazole] Hives    Family History  Problem Relation Age of Onset   Lupus Mother    Diabetes Mother        pre diabetes   Diabetes Father    Hyperlipidemia Father    Diabetes Brother    Depression Paternal Grandmother    Bipolar disorder Paternal Grandmother     Social History   Socioeconomic History   Marital status: Married    Spouse name: Claree Mcinerny   Number of children: 2   Years of education: 16   Highest education level: Bachelor's degree (e.g., BA, AB, BS)  Occupational History   Occupation: disability  Tobacco Use   Smoking status: Never   Smokeless tobacco: Never  Vaping Use   Vaping Use: Never used  Substance and Sexual Activity    Alcohol use: Not Currently   Drug use: Never   Sexual activity: Not Currently    Partners: Male    Birth control/protection: Surgical  Other Topics Concern   Not on file  Social History Narrative   Not on file   Social Determinants of Health   Financial Resource Strain: Low Risk  (09/11/2021)   Overall Financial Resource Strain (CARDIA)    Difficulty of Paying Living Expenses: Not hard at all  Food Insecurity: No Food Insecurity (09/11/2021)   Hunger Vital Sign    Worried About Running Out of Food in the Last Year: Never true    Ran Out of Food in the Last Year: Never true  Transportation Needs: No Transportation Needs (09/11/2021)   PRAPARE - Hydrologist (Medical): No    Lack of Transportation (Non-Medical): No  Physical Activity: Inactive (09/11/2021)   Exercise Vital Sign    Days of Exercise per Week: 0 days    Minutes of Exercise per Session: 0 min  Stress: Stress Concern Present (09/11/2021)   [place]    Feeling of Stress : Rather much  Social Connections: Socially Integrated (09/11/2021)   Social Connection and Isolation Panel [NHANES]    Frequency of Communication with Friends and Family: Once a week    Frequency of Social Gatherings with Friends and Family: More than three times a week    Attends Religious Services: More than 4 times per year    Active Member of [employer] or Organizations: Yes    Attends Archivist Meetings: More than 4 times per year    Marital Status: Married  Human resources officer Violence: Not At Risk (05/12/2017)   Humiliation, Afraid, Rape, and Kick questionnaire    Fear of Current or Ex-Partner: No    Emotionally Abused: No    Physically Abused: No    Sexually Abused: No     Constitutional: Patient reports intermittent headaches.  Denies fever, malaise, fatigue, or abrupt weight changes.  HEENT: Denies eye pain, eye redness, ear pain, ringing in  the ears, wax buildup, runny nose, nasal congestion, bloody nose, or sore throat. Respiratory: Denies difficulty breathing, shortness of breath, cough or sputum production.   Cardiovascular: Denies chest pain, chest tightness, palpitations or swelling in the hands or feet.  Gastrointestinal: Patient reports intermittent reflux and diarrhea.  Denies abdominal pain, bloating, constipation, or blood in the stool.  GU: Denies urgency, frequency, pain with urination, burning sensation, blood in urine, odor or discharge. Musculoskeletal: Denies  decrease in range of motion, difficulty with gait, muscle pain or joint pain and swelling.  Skin: Denies redness, rashes, lesions or ulcercations.  Neurological: Pt reports neuropathic pain. Denies dizziness, difficulty with memory, difficulty with speech or problems with balance and coordination.  Psych: Patient has a history of depression.  Denies anxiety, SI/HI.  No other specific complaints in a complete review of systems (except as listed in HPI above).  Objective:   Physical Exam   BP 132/64 (BP Location: Left Arm, Patient Position: Sitting, Cuff Size: Large)   Pulse 76   Temp (!) 96.8 F (36 C) (Temporal)   Ht '5\' 2"'$  (1.575 m)   Wt 244 lb (110.7 kg)   SpO2 99%   BMI 44.63 kg/m   Wt Readings from Last 3 Encounters:  05/26/22 245 lb 3.2 oz (111.2 kg)  03/31/22 242 lb 14.4 oz (110.2 kg)  03/23/22 245 lb (111.1 kg)    General: Appears her stated age, obese, in NAD. Skin: Warm, dry and intact. No ulcerations noted. HEENT: Head: normal shape and size; Eyes: sclera white, no icterus, conjunctiva pink, PERRLA and EOMs intact;  Neck:  Neck supple, trachea midline. No masses, lumps or thyromegaly present.  Cardiovascular: Normal rate and rhythm. S1,S2 noted.  No murmur, rubs or gallops noted. No JVD or BLE edema. No carotid bruits noted. Pulmonary/Chest: Normal effort and positive vesicular breath sounds. No respiratory distress. No wheezes, rales  or ronchi noted.  Abdomen: Soft and nontender. Normal bowel sounds.  Pelvic: Normal female anatomy.  Cervix without mass or lesion.  No CMT.  Adnexa nonpalpable. Musculoskeletal: Strength 5/5 BUE/BLE.Marland Kitchen No difficulty with gait.  Neurological: Alert and oriented. Cranial nerves II-XII grossly intact. Coordination normal.  Psychiatric: Mood and affect normal. Behavior is normal. Judgment and thought content normal.     BMET    Component Value Date/Time   NA 137 03/23/2022 0853   NA 137 09/07/2019 1657   K 4.3 03/23/2022 0853   CL 100 03/23/2022 0853   CO2 26 03/23/2022 0853   GLUCOSE 236 (H) 03/23/2022 0853   BUN 12 03/23/2022 0853   BUN 14 09/07/2019 1657   CREATININE 0.60 03/23/2022 0853   CALCIUM 9.4 03/23/2022 0853   GFRNONAA 76 09/07/2019 1657   GFRAA 88 09/07/2019 1657    Lipid Panel     Component Value Date/Time   CHOL 167 03/23/2022 0853   CHOL 138 09/07/2019 1657   CHOL 176 04/23/2017 1341   TRIG 397 (H) 03/23/2022 0853   TRIG 253 (H) 04/23/2017 1341   HDL 50 03/23/2022 0853   HDL 55 09/07/2019 1657   CHOLHDL 3.3 03/23/2022 0853   VLDL 51 (H) 04/23/2017 1341   LDLCALC 69 03/23/2022 0853    CBC    Component Value Date/Time   WBC 7.6 06/16/2022 1224   RBC 4.38 06/16/2022 1224   HGB 12.8 06/16/2022 1224   HGB 13.3 04/28/2018 1604   HCT 37.3 06/16/2022 1224   HCT 39.6 04/28/2018 1604   PLT 183 06/16/2022 1224   PLT 201 04/28/2018 1604   MCV 85.2 06/16/2022 1224   MCV 86 04/28/2018 1604   MCH 29.2 06/16/2022 1224   MCHC 34.3 06/16/2022 1224   RDW 14.1 06/16/2022 1224   RDW 15.1 04/28/2018 1604   LYMPHSABS 2.4 04/28/2018 1604   MONOABS 378 09/26/2015 1154   EOSABS 0.1 04/28/2018 1604   BASOSABS 0.0 04/28/2018 1604    Hgb A1C Lab Results  Component Value Date   HGBA1C 9.0 (  A) 03/23/2022           Assessment & Plan:   Preventative Health Maintenance:  Encouraged her to get a flu shot in the fall Tetanus UTD Encouraged her to get her  COVID-vaccine Pneumovax and Prevnar UTD Discussed Shingrix vaccine, she will check coverage with her insurance company and schedule visit if she would like to have this done Pap smear today Mammogram ordered-she will call to schedule Cologuard ordered Encouraged her to consume a balanced diet and exercise regimen Advised her to see an eye doctor and dentist annually We will check CBC, c-Met, lipid, A1c today  RTC in 3 months, follow-up chronic conditions Webb Silversmith, NP

## 2022-06-24 ENCOUNTER — Encounter: Payer: Self-pay | Admitting: Internal Medicine

## 2022-06-24 LAB — LIPID PANEL
Cholesterol: 149 mg/dL (ref <200)
HDL: 51 mg/dL (ref >=50)
Non-HDL Cholesterol (Calc): 98 mg/dL (calc) (ref <130)
Total CHOL/HDL Ratio: 2.9 (calc) (ref <5.0)
Triglycerides: 449 mg/dL — ABNORMAL HIGH (ref <150)

## 2022-06-24 LAB — CBC
HCT: 38.8 % (ref 35.0–45.0)
Hemoglobin: 13 g/dL (ref 11.7–15.5)
MCH: 29.4 pg (ref 27.0–33.0)
MCHC: 33.5 g/dL (ref 32.0–36.0)
MCV: 87.8 fL (ref 80.0–100.0)
MPV: 10.8 fL (ref 7.5–12.5)
Platelets: 196 10*3/uL (ref 140–400)
RBC: 4.42 10*6/uL (ref 3.80–5.10)
RDW: 14.5 % (ref 11.0–15.0)
WBC: 7.5 10*3/uL (ref 3.8–10.8)

## 2022-06-24 LAB — COMPLETE METABOLIC PANEL WITH GFR
AG Ratio: 2 (calc) (ref 1.0–2.5)
ALT: 25 U/L (ref 6–29)
AST: 18 U/L (ref 10–35)
Albumin: 4.3 g/dL (ref 3.6–5.1)
Alkaline phosphatase (APISO): 110 U/L (ref 37–153)
BUN: 13 mg/dL (ref 7–25)
CO2: 25 mmol/L (ref 20–32)
Calcium: 9.5 mg/dL (ref 8.6–10.4)
Chloride: 101 mmol/L (ref 98–110)
Creat: 0.69 mg/dL (ref 0.50–1.03)
Globulin: 2.2 g/dL (calc) (ref 1.9–3.7)
Glucose, Bld: 280 mg/dL — ABNORMAL HIGH (ref 65–99)
Potassium: 4.5 mmol/L (ref 3.5–5.3)
Sodium: 137 mmol/L (ref 135–146)
Total Bilirubin: 0.5 mg/dL (ref 0.2–1.2)
Total Protein: 6.5 g/dL (ref 6.1–8.1)
eGFR: 106 mL/min/{1.73_m2} (ref >=60)

## 2022-06-24 LAB — HEMOGLOBIN A1C
Hgb A1c MFr Bld: 8.8 % of total Hgb — ABNORMAL HIGH (ref <5.7)
Mean Plasma Glucose: 206 mg/dL
eAG (mmol/L): 11.4 mmol/L

## 2022-06-24 MED ORDER — OZEMPIC (0.25 OR 0.5 MG/DOSE) 2 MG/3ML ~~LOC~~ SOPN: 0.5000 mg | PEN_INJECTOR | SUBCUTANEOUS | 0 refills | Status: DC

## 2022-06-24 MED ORDER — ATORVASTATIN CALCIUM 80 MG PO TABS: 80.0000 mg | ORAL_TABLET | Freq: Every day | ORAL | 0 refills | Status: DC

## 2022-07-01 LAB — CYTOLOGY - PAP
Adequacy: ABSENT
Comment: NEGATIVE
Diagnosis: NEGATIVE
High risk HPV: NEGATIVE

## 2022-07-02 ENCOUNTER — Ambulatory Visit: Payer: Medicare Other

## 2022-07-02 ENCOUNTER — Other Ambulatory Visit: Payer: Self-pay | Admitting: Internal Medicine

## 2022-07-02 NOTE — Telephone Encounter (Signed)
Requested Prescriptions  Pending Prescriptions Disp Refills   TRESIBA FLEXTOUCH 100 UNIT/ML FlexTouch Pen [Pharmacy Med Name: TRESIBA FLEXTOUCH PEN (U-100)[id number]] 15 mL 2    Sig: ADMINISTER Tabitha [name]     Endocrinology:  Diabetes - Insulins Failed - 07/02/2022 12:55 PM      Failed - HBA1C is between 0 and 7.9 and within 180 days    HB A1C (BAYER DCA - WAIVED)  Date Value Ref Range Status  10/22/2017 6.9 <7.0 % Final    Comment:                                          Diabetic Adult            <7.0                                       Healthy Adult        4.3 - 5.7                                                           (DCCT/NGSP) American Diabetes Association's Summary of Glycemic Recommendations for Adults with Diabetes: Hemoglobin A1c <7.0%. More stringent glycemic goals (A1c <6.0%) may further reduce complications at the cost of increased risk of hypoglycemia.    Hgb A1c MFr Bld  Date Value Ref Range Status  06/23/2022 8.8 (H) <5.7 % of total Hgb Final    Comment:    For someone without known diabetes, a hemoglobin A1c value of 6.5% or greater indicates that they may have  diabetes and this should be confirmed with a follow-up  test. . For someone with known diabetes, a value <7% indicates  that their diabetes is well controlled and a value  greater than or equal to 7% indicates suboptimal  control. A1c targets should be individualized based on  duration of diabetes, age, comorbid conditions, and  other considerations. . Currently, no consensus exists regarding use of hemoglobin A1c for diagnosis of diabetes for children. Tabitha [name] - Valid encounter within last 6 months    Recent Outpatient Visits           1 week ago Encounter for general adult medical examination with abnormal findings   Tabitha [name], Tabitha [name], Tabitha [name]   3 months ago Type 2 diabetes mellitus with diabetic polyneuropathy, with long-term  current use of insulin Tabitha [name], Tabitha Keens, Tabitha [name]   3 months ago Viral URI with cough   Tabitha [name], Tabitha [name], Tabitha [name]   6 months ago Type 2 diabetes mellitus with diabetic polyneuropathy, with long-term current use of insulin Tabitha [name], Tabitha Keens, Tabitha [name]   10 months ago Acute cystitis without hematuria   Tabitha [name], Tabitha Keens, Tabitha [name]       Future Appointments             In 2 months  Jearld Fenton, Tabitha [name] [facility], [facility]

## 2022-07-06 ENCOUNTER — Ambulatory Visit (INDEPENDENT_AMBULATORY_CARE_PROVIDER_SITE_OTHER): Payer: Medicare Other

## 2022-07-06 ENCOUNTER — Telehealth (INDEPENDENT_AMBULATORY_CARE_PROVIDER_SITE_OTHER): Payer: Medicare Other | Admitting: Internal Medicine

## 2022-07-06 ENCOUNTER — Encounter: Payer: Self-pay | Admitting: Internal Medicine

## 2022-07-06 ENCOUNTER — Telehealth: Payer: Self-pay | Admitting: Psychiatry

## 2022-07-06 ENCOUNTER — Ambulatory Visit: Payer: Self-pay

## 2022-07-06 VITALS — BP 171/97 | HR 111 | Temp 98.4°F | Ht 62.0 in | Wt 237.8 lb

## 2022-07-06 DIAGNOSIS — Z7985 Long-term (current) use of injectable non-insulin antidiabetic drugs: Secondary | ICD-10-CM

## 2022-07-06 DIAGNOSIS — F251 Schizoaffective disorder, depressive type: Secondary | ICD-10-CM

## 2022-07-06 DIAGNOSIS — E1142 Type 2 diabetes mellitus with diabetic polyneuropathy: Secondary | ICD-10-CM | POA: Diagnosis not present

## 2022-07-06 DIAGNOSIS — E782 Mixed hyperlipidemia: Secondary | ICD-10-CM | POA: Diagnosis not present

## 2022-07-06 DIAGNOSIS — Z794 Long term (current) use of insulin: Secondary | ICD-10-CM

## 2022-07-06 DIAGNOSIS — Z6841 Body Mass Index (BMI) 40.0 and over, adult: Secondary | ICD-10-CM | POA: Diagnosis not present

## 2022-07-06 DIAGNOSIS — Z1211 Encounter for screening for malignant neoplasm of colon: Secondary | ICD-10-CM | POA: Diagnosis not present

## 2022-07-06 DIAGNOSIS — Z7984 Long term (current) use of oral hypoglycemic drugs: Secondary | ICD-10-CM | POA: Diagnosis not present

## 2022-07-06 MED ORDER — TRESIBA FLEXTOUCH 100 UNIT/ML ~~LOC~~ SOPN: 44.0000 [IU] | PEN_INJECTOR | Freq: Every day | SUBCUTANEOUS | 2 refills | Status: DC

## 2022-07-06 MED ORDER — ATORVASTATIN CALCIUM 40 MG PO TABS: 40.0000 mg | ORAL_TABLET | Freq: Every day | ORAL | 1 refills | Status: DC

## 2022-07-06 MED ORDER — PALIPERIDONE PALMITATE ER 234 MG/1.5ML IM SUSY
234.0000 mg | PREFILLED_SYRINGE | Freq: Once | INTRAMUSCULAR | Status: AC
  Administered 2022-07-06: 234 mg via INTRAMUSCULAR

## 2022-07-06 NOTE — Assessment & Plan Note (Signed)
Unable to tolerate Ozempic Continue metformin and glipizide Increase Tresiba to 44 units daily Advised her to monitor fasting glucose and update me in 2 weeks Encouraged low-carb diet and exercise for weight loss Discussed again referral to endocrinology but she is remains hesitant at this time

## 2022-07-06 NOTE — Telephone Encounter (Signed)
Tabitha [name] , please check with her when is last date for submission of jury duty excuse , since Dr.Hisada will be back in office soon. Please let me know.

## 2022-07-06 NOTE — Telephone Encounter (Signed)
I have sent a message to Tabitha [name] already to check when her due date for submission of excuse is and to let me know.

## 2022-07-06 NOTE — Telephone Encounter (Signed)
Noted , please let patient know to contact Dr.Hisada once she is back.

## 2022-07-06 NOTE — Patient Instructions (Signed)
Patient in today for due every 28 day Invega Sustenna 234 mg/1.5 ml injection. Patient presented clean and answered questions. Patient did mentioned that she felt she could tell it was time for her injection she stated that she experienced some anxiety and depression. Patient denied any auditory or visual hallucinations, no suicidal or homicidal ideations or plans or intent to harm self or others. Patient reported still doing fine with every 28 day dosage and had no complaints or concerns.   Injection given in left upper out quant area. Patient tolerated injection without any complaint of pain or discomfort and agreed to return in 28 days for next due injection.    Patient to call if any issues or problems after injection today or prior to next appointment  Invega sustenna '234mg'$ /1.60m   [id number]   LOT# [id number]  EXP  10-26-2023, SN# [id number]  GTIN# [id number]

## 2022-07-06 NOTE — Telephone Encounter (Signed)
pcp was faxed and confirmed the bp reading for today.

## 2022-07-06 NOTE — Telephone Encounter (Signed)
  Chief Complaint: Possible medication side effects Symptoms: Abdominal pain, nausea, diarrhea Nad joint pain Frequency: Since starting new medications Pertinent Negatives: Patient denies  Disposition: [] ED /[] Urgent Care (no appt availability in office) / [x] Appointment(In office/virtual)/ []  [place] [facility]/ [] Home Care/ [] Refused Recommended Disposition /[] [place] [facility]/ []  Follow-up with PCP Additional Notes: Pt thinks that she is having side effects from medications. She recently started Ozempic and atorvastatin was increased to 80 mg.  PT is having nausea, diarrhea and abdominal pain & joint pain.      Reason for Disposition  [1] Caller has URGENT medicine question about med that PCP or specialist prescribed AND [2] triager unable to answer question  Answer Assessment - Initial Assessment Questions 1. NAME of MEDICINE: "What medicine(s) are you calling about?"     Ozempic and 80 mg of atorvastain 2. QUESTION: "What is your question?" (e.g., double dose of medicine, side effect)     Pt thinks she is have side effects from these medications 3. PRESCRIBER: "Who prescribed the medicine?" Reason: if prescribed by specialist, call should be referred to that group.     [employer] 4. SYMPTOMS: "Do you have any symptoms?" If Yes, ask: "What symptoms are you having?"  "How bad are the symptoms (e.g., mild, moderate, severe)     Diarrhea, abdominal pain and nausea. Joint pain  Protocols used: Medication Question Call-A-AH

## 2022-07-06 NOTE — Telephone Encounter (Signed)
Patient received jury duty letter. She is requesting a jury duty excuse letter. ROI has been signed and will scan if letter is done. Please advise

## 2022-07-06 NOTE — Assessment & Plan Note (Signed)
Increased myalgias on atorvastatin 80 mg Decrease back to 40 mg daily Reinforced low-fat diet

## 2022-07-06 NOTE — Assessment & Plan Note (Signed)
Encourage diet and exercise for weight loss 

## 2022-07-06 NOTE — Progress Notes (Signed)
Patient in today for due every 28 day Invega Sustenna 234 mg/1.5 ml injection. Patient presented clean and answered questions. Patient did mentioned that she felt she could tell it was time for her injection she stated that she experienced some anxiety and depression. Patient denied any auditory or visual hallucinations, no suicidal or homicidal ideations or plans or intent to harm self or others. Patient reported still doing fine with every 28 day dosage and had no complaints or concerns.   Injection given in left upper out quant area. Patient tolerated injection without any complaint of pain or discomfort and agreed to return in 28 days for next due injection.    Patient to call if any issues or problems after injection today or prior to next appointment  Invega sustenna '234mg'$ /1.59m   [facility] # [id number]   LOT# [id number]  EXP  10-26-2023, SN# [id number]  GTIN# [id number]

## 2022-07-06 NOTE — Patient Instructions (Signed)

## 2022-07-06 NOTE — Progress Notes (Signed)
Virtual Visit via Video Note  I connected with Tabitha [name] on 07/06/22 at  4:00 PM EDT by a video enabled telemedicine application and verified that I am speaking with the correct person using two identifiers.  Location: Patient: Home Provider: Office  Person's participating in this video call: Webb Silversmith NP- C and Mikey Bussing.   I discussed the limitations of evaluation and management by telemedicine and the availability of in person appointments. The patient expressed understanding and agreed to proceed.  History of Present Illness:  Patient reports abdominal pain, nausea, diarrhea and bodyaches.  This started about 1 week.  She thinks this may be related to Ozempic or Atorvastatin which she both recently started.  Her last A1c was 8.8%.  She is taking [place], Metformin, Glipizide and Ozempic as prescribed.  Her last LDL was 51, triglycerides 449.  Her Atorvastatin was increased to 80 mg daily.  She has been hesitant to see endocrinology in the past.  She is seeing a dietitian and reports that she is trying to monitor fats and carbs in her diet but is not always consistent with it.   Past Medical History:  Diagnosis Date   Allergic rhinitis    Anxiety    Asthma    Bipolar disorder ([place])    Depression    Diabetes mellitus without complication (HCC)    GERD (gastroesophageal reflux disease)    Hyperlipidemia    Hypertension    Mild sleep apnea    OCD (obsessive compulsive disorder)    Palpitations    Paranoia (HCC)    Schizoaffective disorder (HCC)    Sleep apnea     Current Outpatient Medications  Medication Sig Dispense Refill   ACCU-CHEK GUIDE test strip USE AS DIRECTED TO TEST TWICE DAILY 100 strip 2   albuterol (VENTOLIN HFA) 108 (90 Base) MCG/ACT inhaler INHALE 2 PUFFS INTO THE LUNGS EVERY 6 HOURS AS NEEDED FOR WHEEZING OR SHORTNESS OF BREATH 25.5 g 3   atorvastatin (LIPITOR) 80 MG tablet Take 1 tablet (80 mg total) by mouth daily. 90 tablet 0   [START ON  07/19/2022] divalproex (DEPAKOTE ER) 500 MG 24 hr tablet Take 1 tablet (500 mg total) by mouth daily. 90 tablet 0   EPINEPHrine 0.3 mg/0.3 mL IJ SOAJ injection Inject 0.3 mg into the muscle as needed for anaphylaxis. 1 each 0   ezetimibe (ZETIA) 10 MG tablet TAKE 1 TABLET(10 MG) BY MOUTH DAILY 90 tablet 0   fluticasone (FLOVENT HFA) 110 MCG/ACT inhaler Inhale 1 puff into the lungs 2 (two) times daily. 1 Inhaler 11   gabapentin (NEURONTIN) 600 MG tablet Take 1 tablet (600 mg total) by mouth 3 (three) times daily. 270 tablet 0   glipiZIDE (GLUCOTROL) 10 MG tablet TAKE 1 TABLET(10 MG) BY MOUTH TWICE DAILY BEFORE A MEAL 180 tablet 0   ibuprofen (ADVIL) 200 MG tablet Take 200 mg by mouth every 6 (six) hours as needed.     Insulin Pen Needle (BD PEN NEEDLE NANO U/F) 32G X 4 MM MISC 1 each by Does not apply route daily. 100 each 2   INVEGA SUSTENNA 234 MG/1.5ML injection ADMINISTER 1.5 ML IN THE MUSCLE EVERY 28 DAYS 1.5 mL 11   MELATONIN PO Take 5 mg by mouth at bedtime as needed.     metFORMIN (GLUCOPHAGE) 500 MG tablet TAKE 2 TABLETS(1000 MG) BY MOUTH TWICE DAILY WITH A MEAL 360 tablet 0   [START ON 07/20/2022] OLANZapine (ZYPREXA) 7.5 MG tablet Take 1 tablet (7.5  mg total) by mouth at bedtime. 90 tablet 0   omeprazole (PRILOSEC) 10 MG capsule TAKE 1 CAPSULE BY MOUTH DAILY 90 capsule 0   OZEMPIC, 0.25 OR 0.5 MG/DOSE, 2 MG/3ML SOPN Inject 0.5 mg into the skin once a week. Start with 0.'25mg'$  weekly x 4 weeks then increase to 0.'5mg'$  weekly injection. 9 mL 0   TRESIBA FLEXTOUCH 100 UNIT/ML FlexTouch Pen ADMINISTER 40 UNITS UNDER THE SKIN EVERY DAY 15 mL 2   Vilazodone HCl (VIIBRYD) 40 MG TABS Take 1 tablet (40 mg total) by mouth daily. 90 tablet 1   No current facility-administered medications for this visit.    Allergies  Allergen Reactions   Abilify [Aripiprazole] Hives    Family History  Problem Relation Age of Onset   Lupus Mother    Diabetes Mother        pre diabetes   Diabetes Father     Hyperlipidemia Father    Diabetes Brother    Breast cancer Maternal Grandmother    Depression Paternal Grandmother    Bipolar disorder Paternal Grandmother    Cancer - Colon Neg Hx     Social History   Socioeconomic History   Marital status: Married    Spouse name: Chelce Notch   Number of children: 2   Years of education: 16   Highest education level: Bachelor's degree (e.g., BA, AB, BS)  Occupational History   Occupation: disability  Tobacco Use   Smoking status: Never   Smokeless tobacco: Never  Vaping Use   Vaping Use: Never used  Substance and Sexual Activity   Alcohol use: Not Currently   Drug use: Never   Sexual activity: Not Currently    Partners: Male    Birth control/protection: Surgical  Other Topics Concern   Not on file  Social History Narrative   Not on file   Social Determinants of Health   Financial Resource Strain: Low Risk  (09/11/2021)   Overall Financial Resource Strain (CARDIA)    Difficulty of Paying Living Expenses: Not hard at all  Food Insecurity: No Food Insecurity (09/11/2021)   Hunger Vital Sign    Worried About Running Out of Food in the Last Year: Never true    Ran Out of Food in the Last Year: Never true  Transportation Needs: No Transportation Needs (09/11/2021)   PRAPARE - Hydrologist (Medical): No    Lack of Transportation (Non-Medical): No  Physical Activity: Inactive (09/11/2021)   Exercise Vital Sign    Days of Exercise per Week: 0 days    Minutes of Exercise per Session: 0 min  Stress: Stress Concern Present (09/11/2021)   Horatio    Feeling of Stress : Rather much  Social Connections: Socially Integrated (09/11/2021)   Social Connection and Isolation Panel [NHANES]    Frequency of Communication with Friends and Family: Once a week    Frequency of Social Gatherings with Friends and Family: More than three times a week    Attends  Religious Services: More than 4 times per year    Active Member of [employer] or Organizations: Yes    Attends Archivist Meetings: More than 4 times per year    Marital Status: Married  Human resources officer Violence: Not At Risk (05/12/2017)   Humiliation, Afraid, Rape, and Kick questionnaire    Fear of Current or Ex-Partner: No    Emotionally Abused: No    Physically Abused: No  Sexually Abused: No     Constitutional: Denies fever, malaise, fatigue, headache or abrupt weight changes.  Cardiovascular: Denies chest pain, chest tightness, palpitations or swelling in the hands or feet.  Gastrointestinal: Patient reports abdominal pain, nausea, diarrhea.  Denies bloating, constipation, or blood in the stool.  GU: Denies urgency, frequency, pain with urination, burning sensation, blood in urine, odor or discharge. Musculoskeletal: Patient reports body aches.  Denies decrease in range of motion, difficulty with gait, or joint swelling.  Skin: Denies redness, rashes, lesions or ulcercations.   No other specific complaints in a complete review of systems (except as listed in HPI above).    Observations/Objective:   Wt Readings from Last 3 Encounters:  06/23/22 244 lb (110.7 kg)  05/26/22 245 lb 3.2 oz (111.2 kg)  03/31/22 242 lb 14.4 oz (110.2 kg)    General: Appears her stated age, obese, in NAD. Pulmonary/Chest: Normal effort. No respiratory distress. Neurological: Alert and oriented.   BMET    Component Value Date/Time   NA 137 06/23/2022 0952   NA 137 09/07/2019 1657   K 4.5 06/23/2022 0952   CL 101 06/23/2022 0952   CO2 25 06/23/2022 0952   GLUCOSE 280 (H) 06/23/2022 0952   BUN 13 06/23/2022 0952   BUN 14 09/07/2019 1657   CREATININE 0.69 06/23/2022 0952   CALCIUM 9.5 06/23/2022 0952   GFRNONAA 76 09/07/2019 1657   GFRAA 88 09/07/2019 1657    Lipid Panel     Component Value Date/Time   CHOL 149 06/23/2022 0952   CHOL 138 09/07/2019 1657   CHOL 176 04/23/2017  1341   TRIG 449 (H) 06/23/2022 0952   TRIG 253 (H) 04/23/2017 1341   HDL 51 06/23/2022 0952   HDL 55 09/07/2019 1657   CHOLHDL 2.9 06/23/2022 0952   VLDL 51 (H) 04/23/2017 1341   LDLCALC  06/23/2022 0952     Comment:     . LDL cholesterol not calculated. Triglyceride levels greater than 400 mg/dL invalidate calculated LDL results. . Reference range: <100 . Desirable range <100 mg/dL for primary prevention;   <70 mg/dL for patients with CHD or diabetic patients  with > or = 2 CHD risk factors. Marland Kitchen LDL-C is now calculated using the Martin-Hopkins  calculation, which is a validated novel method providing  better accuracy than the Friedewald equation in the  estimation of LDL-C.  Cresenciano Genre et al. Annamaria Helling. MU:[id number]): 2061-2068  (http://education.[employer].com/faq/FAQ164)     CBC    Component Value Date/Time   WBC 7.5 06/23/2022 0952   RBC 4.42 06/23/2022 0952   HGB 13.0 06/23/2022 0952   HGB 13.3 04/28/2018 1604   HCT 38.8 06/23/2022 0952   HCT 39.6 04/28/2018 1604   PLT 196 06/23/2022 0952   PLT 201 04/28/2018 1604   MCV 87.8 06/23/2022 0952   MCV 86 04/28/2018 1604   MCH 29.4 06/23/2022 0952   MCHC 33.5 06/23/2022 0952   RDW 14.5 06/23/2022 0952   RDW 15.1 04/28/2018 1604   LYMPHSABS 2.4 04/28/2018 1604   MONOABS 378 09/26/2015 1154   EOSABS 0.1 04/28/2018 1604   BASOSABS 0.0 04/28/2018 1604    Hgb A1C Lab Results  Component Value Date   HGBA1C 8.8 (H) 06/23/2022       Assessment and Plan:   Follow Up Instructions:    I discussed the assessment and treatment plan with the patient. The patient was provided an opportunity to ask questions and all were answered. The patient agreed with the plan  and demonstrated an understanding of the instructions.   The patient was advised to call back or seek an in-person evaluation if the symptoms worsen or if the condition fails to improve as anticipated.    Webb Silversmith, NP

## 2022-07-06 NOTE — Telephone Encounter (Signed)
pt came into office today for her injection. pt states that she is feeling anxious about doing jury dutiy. pt was told that we can provider her a note to be excused from jury duty. please when you have a few minute write a jury duty excuse.

## 2022-07-09 LAB — COLOGUARD: COLOGUARD: NEGATIVE

## 2022-07-11 ENCOUNTER — Encounter: Payer: Self-pay | Admitting: Psychiatry

## 2022-07-11 NOTE — Telephone Encounter (Signed)
The letter has been printed out and is available at the front desk. Please obtain the ROI from the patient and proceed to mail the letter to her.

## 2022-07-14 ENCOUNTER — Other Ambulatory Visit: Payer: Self-pay | Admitting: Internal Medicine

## 2022-07-14 DIAGNOSIS — K219 Gastro-esophageal reflux disease without esophagitis: Secondary | ICD-10-CM

## 2022-07-14 NOTE — Telephone Encounter (Signed)
Requested Prescriptions  Pending Prescriptions Disp Refills   omeprazole (PRILOSEC) 10 MG capsule [Pharmacy Med Name: OMEPRAZOLE 10MG  CAPSULES] 90 capsule 0    Sig: TAKE 1 CAPSULE BY MOUTH DAILY     Gastroenterology: Proton Pump Inhibitors Passed - 07/14/2022  7:35 AM      Passed - Valid encounter within last 12 months    Recent Outpatient Visits           1 week ago Type 2 diabetes mellitus with diabetic polyneuropathy, with long-term current use of insulin [facility])   [facility] [place], Coralie Keens, NP   3 weeks ago Encounter for general adult medical examination with abnormal findings   [facility] [place], [place] W, NP   3 months ago Type 2 diabetes mellitus with diabetic polyneuropathy, with long-term current use of insulin [facility])   [facility] [place], Coralie Keens, NP   4 months ago Viral URI with cough   [facility] [place], [place] W, NP   7 months ago Type 2 diabetes mellitus with diabetic polyneuropathy, with long-term current use of insulin [facility])   [facility] [place], Coralie Keens, NP       Future Appointments             In 2 months Baity, Coralie Keens, NP [facility], [facility]

## 2022-07-30 ENCOUNTER — Encounter: Payer: Self-pay | Admitting: Physician Assistant

## 2022-07-30 ENCOUNTER — Ambulatory Visit (INDEPENDENT_AMBULATORY_CARE_PROVIDER_SITE_OTHER): Payer: Medicare Other | Admitting: Physician Assistant

## 2022-07-30 VITALS — BP 136/84 | HR 106 | Temp 96.6°F | Wt 232.0 lb

## 2022-07-30 DIAGNOSIS — L255 Unspecified contact dermatitis due to plants, except food: Secondary | ICD-10-CM | POA: Diagnosis not present

## 2022-07-30 DIAGNOSIS — L0100 Impetigo, unspecified: Secondary | ICD-10-CM | POA: Diagnosis not present

## 2022-07-30 DIAGNOSIS — L0390 Cellulitis, unspecified: Secondary | ICD-10-CM | POA: Diagnosis not present

## 2022-07-30 MED ORDER — SULFAMETHOXAZOLE-TRIMETHOPRIM 800-160 MG PO TABS: 1.0000 | ORAL_TABLET | Freq: Two times a day (BID) | ORAL | 0 refills | Status: AC

## 2022-07-30 MED ORDER — PREDNISONE 20 MG PO TABS: ORAL_TABLET | ORAL | 0 refills | Status: DC

## 2022-07-30 NOTE — Progress Notes (Signed)
Acute Office Visit   Patient: Tabitha [name]   DOB: January 16, 1971   52 y.o. Female  MRN: [id number] Visit Date: 07/30/2022  Today's healthcare provider: Oswaldo Conroy Avereigh Spainhower, PA-C  Introduced myself to the patient as a Secondary school teacher and provided education on APPs in clinical practice.    Chief Complaint  Patient presents with   Poison Ivy     Rash with blisters and itching    Subjective    HPI HPI     Poison Ivy    Additional comments:  Rash with blisters and itching       Last edited by Providence Crosby, PA-C on 08/03/2022  1:00 PM.       Onset: sudden Duration: thinks she got into the poison ivy on Sat and rash started on Sun She reports it itches and is painful if she scratches it  She denies tongue swelling, tingling in mouth or throat, trouble swallowing She reports diarrhea and nausea have been ongoing since rash started  She has been using Teknu? Spray and Tylenol for pain  She reports her glucose levels are running in 200s usually    Medications: Outpatient Medications Prior to Visit  Medication Sig   ACCU-CHEK GUIDE test strip USE AS DIRECTED TO TEST TWICE DAILY   albuterol (VENTOLIN HFA) 108 (90 Base) MCG/ACT inhaler INHALE 2 PUFFS INTO THE LUNGS EVERY 6 HOURS AS NEEDED FOR WHEEZING OR SHORTNESS OF BREATH   atorvastatin (LIPITOR) 40 MG tablet Take 1 tablet (40 mg total) by mouth daily.   divalproex (DEPAKOTE ER) 500 MG 24 hr tablet Take 1 tablet (500 mg total) by mouth daily.   EPINEPHrine 0.3 mg/0.3 mL IJ SOAJ injection Inject 0.3 mg into the muscle as needed for anaphylaxis.   ezetimibe (ZETIA) 10 MG tablet TAKE 1 TABLET(10 MG) BY MOUTH DAILY   fluticasone (FLOVENT HFA) 110 MCG/ACT inhaler Inhale 1 puff into the lungs 2 (two) times daily.   gabapentin (NEURONTIN) 600 MG tablet Take 1 tablet (600 mg total) by mouth 3 (three) times daily.   glipiZIDE (GLUCOTROL) 10 MG tablet TAKE 1 TABLET(10 MG) BY MOUTH TWICE DAILY BEFORE A MEAL   ibuprofen (ADVIL) 200 MG tablet  Take 200 mg by mouth every 6 (six) hours as needed.   insulin degludec (TRESIBA FLEXTOUCH) 100 UNIT/ML FlexTouch Pen Inject 44 Units into the skin at bedtime.   Insulin Pen Needle (BD PEN NEEDLE NANO U/F) 32G X 4 MM MISC 1 each by Does not apply route daily.   INVEGA SUSTENNA 234 MG/1.5ML injection ADMINISTER 1.5 ML IN THE MUSCLE EVERY 28 DAYS   MELATONIN PO Take 5 mg by mouth at bedtime as needed.   metFORMIN (GLUCOPHAGE) 500 MG tablet TAKE 2 TABLETS(1000 MG) BY MOUTH TWICE DAILY WITH A MEAL   OLANZapine (ZYPREXA) 7.5 MG tablet Take 1 tablet (7.5 mg total) by mouth at bedtime.   omeprazole (PRILOSEC) 10 MG capsule TAKE 1 CAPSULE BY MOUTH DAILY   Vilazodone HCl (VIIBRYD) 40 MG TABS Take 1 tablet (40 mg total) by mouth daily.   No facility-administered medications prior to visit.    Review of Systems  Constitutional:  Negative for chills, fatigue and fever.  HENT:  Negative for drooling, sore throat and trouble swallowing.   Respiratory:  Negative for chest tightness, shortness of breath and wheezing.   Gastrointestinal:  Positive for diarrhea and nausea.  Skin:  Positive for rash.       Objective  BP 136/84 (BP Location: Left Arm, Patient Position: Sitting, Cuff Size: Large)   Pulse (!) 106   Temp (!) 96.6 F (35.9 C) (Temporal)   Wt 232 lb (105.2 kg)   SpO2 99%   BMI 42.43 kg/m    Physical Exam Vitals reviewed.  Constitutional:      General: She is awake.     Appearance: Normal appearance. She is well-developed and well-groomed.  HENT:     Head: Normocephalic and atraumatic.  Eyes:     General: Lids are normal. Gaze aligned appropriately.     Extraocular Movements: Extraocular movements intact.     Conjunctiva/sclera: Conjunctivae normal.  Pulmonary:     Effort: Pulmonary effort is normal.     Breath sounds: Normal breath sounds.  Musculoskeletal:     Cervical back: Normal range of motion.  Skin:    Findings: Erythema and rash present. Rash is crusting, macular,  papular and vesicular.  Neurological:     Mental Status: She is alert.  Psychiatric:        Behavior: Behavior is cooperative.      Media Information   Document Information  Photos  Right forearm dermatitis  07/30/2022 14:41  Attached To:  Office Visit on 07/30/22 with Mandeep Ferch, Oswaldo Conroy, PA-C  Source Information  Sharissa Brierley, Oswaldo Conroy, PA-C  [facility]      Media Information   Document Information  Photos    07/30/2022 14:41  Attached To:  Office Visit on 07/30/22 with Eura Radabaugh, Oswaldo Conroy, PA-C  Source Information  Nazareth Norenberg, Oswaldo Conroy, PA-C  [facility]     Media Information   Document Information  Photos    07/30/2022 14:41  Attached To:  Office Visit on 07/30/22 with Brandilyn Nanninga, Oswaldo Conroy, PA-C  Source Information  Jaelani Posa, Oswaldo Conroy, PA-C  [facility]       Media Information   Document Information  Photos    07/30/2022 14:41  Attached To:  Office Visit on 07/30/22 with Allard Lightsey, Oswaldo Conroy, PA-C  Source Information  Marki Frede, Oswaldo Conroy, PA-C  [facility]     Media Information   Document Information  Photos    07/30/2022 14:41  Attached To:  Office Visit on 07/30/22 with Esbeidy Mclaine, Oswaldo Conroy, PA-C  Source Information  Tandra Rosado, Oswaldo Conroy, PA-C  [facility]     No results found for any visits on 07/30/22.  Assessment & Plan      No follow-ups on file.      Problem List Items Addressed This Visit   None Visit Diagnoses     Dermatitis due to plants, including poison ivy, sumac, and oak    -  Primary Acute, new concern Patient present with severe dermatitis over significant portion of her trunk and extremities Given condition of her rash and skin I am concerned for cellulitis and impetigo in several areas I have sent in a script for a prolonged prednisone taper and instructed her to increase her Tresiba to 46 units at bedtime to help with glucose elevations. Her last A1c was above goal in Feb so she should be able to accommodate an increased dose at  baseline I have sent in a script for Bactrim for the cellulitis and impetigo  I have reviewed care measures with her and provided instructions in her AVS  Follow up as needed for persistent or progressing symptoms     Relevant Medications   sulfamethoxazole-trimethoprim (BACTRIM DS) 800-160 MG tablet   predniSONE (DELTASONE) 20  MG tablet   Cellulitis, unspecified cellulitis site       Relevant Medications   sulfamethoxazole-trimethoprim (BACTRIM DS) 800-160 MG tablet   Impetigo       Relevant Medications   sulfamethoxazole-trimethoprim (BACTRIM DS) 800-160 MG tablet        No follow-ups on file.   I, Haliegh Khurana E Kardell Virgil, PA-C, have reviewed all documentation for this visit. The documentation on 08/03/22 for the exam, diagnosis, procedures, and orders are all accurate and complete.   Jacquelin Hawking, MHS, PA-C [facility]

## 2022-07-30 NOTE — Patient Instructions (Addendum)
Based on the physical exam today and your answers to my questions I believe you have a condition called allergic contact dermatitis from coming into contact with an irritant (likely poison oak, ivy, sumac, etc)  In order to help you feel better I recommend the following:  Using a topical treatment such as Calamine lotion, Benadryl cream, Cortisone cream as needed Avoiding scratching the rash as this can lead to secondary infection Gently cleanse the skin with warm water and gentle soap- no harsh abrasives or scrubbing is needed  I am sending in a script for a Prednisone taper. This taper is longer than most to help [place] avoid rebound symptoms from discontinuing too quickly. Steroids can cause the following: sleeplessness, increased appetite, elevated blood sugars, and increased irritability. I recommend taking it in the morning to prevent trouble sleeping and try to make changes to your diet to avoid excess sugar if you are diabetic  I would like you to increase your Tresiba to 46 units at bedtime and monitor your glucose levels daily  The steroid I am going to give you can make your glucose increase so we need to keep an eye on it  I am sending in a script for an oral antibiotic as I am worried some of your rash has become infected I want you to take this as directed and finish the entire course unless you have an allergic reaction Take this with food to prevent an upset stomach   You can take an over the counter antihistamine such as Allegra, Zyrtec or Claritin during the day to help with the symptoms If you want  you can take a benadryl at night to help with sleep and itching but it can cause drowsiness so be mindful while using  If you may be in the same area that you were exposed to the plant I recommend the following to reduce further irritation Wear long sleeves and pants with closed shoes and cover your face. Use protective eye-wear to prevent eye contact Once finished in the area with  the plant, strip your clothing and place it immediately in the wash to be cleaned on the hottest setting with detergent Take a shower and be sure to wash thoroughly to prevent any of the plant oils from lingering on your skin (if you are exposed again without protective clothing- wash your skin immediately with soap and water)  If you think any pets may have been exposed to these plants they will need to be bathed as well. The oils can stay on their hair for some time and lead to further exposure and rash for you.   Please let us know if you have any further questions or concerns.

## 2022-08-03 ENCOUNTER — Ambulatory Visit: Payer: Medicare Other

## 2022-08-07 ENCOUNTER — Other Ambulatory Visit: Payer: Self-pay | Admitting: Internal Medicine

## 2022-08-07 NOTE — Telephone Encounter (Signed)
Requested Prescriptions  Pending Prescriptions Disp Refills   ACCU-CHEK GUIDE test strip [Pharmacy Med Name: ACCU-CHEK GUIDE TEST STRIPS 100S] 100 strip 2    Sig: USE TO TEST TWICE DAILY, AS DIRECTED     Endocrinology: Diabetes - Testing Supplies Passed - 08/07/2022 11:30 AM      Passed - Valid encounter within last 12 months    Recent Outpatient Visits           1 week ago Dermatitis due to plants, including poison ivy, sumac, and oak   [facility] [place], Byars E, PA-C   1 month ago Type 2 diabetes mellitus with diabetic polyneuropathy, with long-term current use of insulin [facility])   [facility] [place], Salvadore Oxford, NP   1 month ago Encounter for general adult medical examination with abnormal findings   [facility] [place], [place] W, NP   4 months ago Type 2 diabetes mellitus with diabetic polyneuropathy, with long-term current use of insulin [facility])   [facility] [place], Salvadore Oxford, NP   4 months ago Viral URI with cough   [facility] [place], Salvadore Oxford, NP       Future Appointments             In 1 month Media, Salvadore Oxford, NP [place] [facility]

## 2022-08-11 ENCOUNTER — Ambulatory Visit (INDEPENDENT_AMBULATORY_CARE_PROVIDER_SITE_OTHER): Payer: Medicare Other

## 2022-08-11 ENCOUNTER — Other Ambulatory Visit: Payer: Self-pay | Admitting: Psychiatry

## 2022-08-11 VITALS — BP 133/82 | HR 88 | Temp 98.0°F | Ht 62.0 in | Wt 236.6 lb

## 2022-08-11 DIAGNOSIS — F3175 Bipolar disorder, in partial remission, most recent episode depressed: Secondary | ICD-10-CM

## 2022-08-11 DIAGNOSIS — F251 Schizoaffective disorder, depressive type: Secondary | ICD-10-CM | POA: Diagnosis not present

## 2022-08-11 DIAGNOSIS — F422 Mixed obsessional thoughts and acts: Secondary | ICD-10-CM

## 2022-08-11 MED ORDER — PALIPERIDONE PALMITATE ER 234 MG/1.5ML IM SUSY
234.0000 mg | PREFILLED_SYRINGE | INTRAMUSCULAR | Status: DC
  Administered 2022-08-11 – 2022-09-08 (×2): 234 mg via INTRAMUSCULAR

## 2022-08-11 NOTE — Patient Instructions (Addendum)
Patient in today for due every 28 day Invega Sustenna 234 mg/1.5 ml injection. Patient presented clean and answered questions. Patient did mentioned that she had poison oak and that why she was late for her injection. Patient denied any auditory or visual hallucinations, no suicidal or homicidal ideations or plans or intent to harm self or others. Patient reported still doing fine with every 28 day dosage and had no complaints or concerns.   Injection given in right upper out quant area. Patient tolerated injection without any complaint of pain or discomfort and agreed to return in 28 days for next due injection.    Patient to call if any issues or problems after injection today or prior to next appointment   Invega sustenna 234mg/1.5ml   NDC # 50458-564-01.   LOT# [id number],  EXP  12-27-2023, SN# [id number].  GTIN# [id number] 

## 2022-08-11 NOTE — Progress Notes (Signed)
Patient in today for due every 28 day Invega Sustenna 234 mg/1.5 ml injection. Patient presented clean and answered questions. Patient did mentioned that she had poison oak and that why she was late for her injection. Patient denied any auditory or visual hallucinations, no suicidal or homicidal ideations or plans or intent to harm self or others. Patient reported still doing fine with every 28 day dosage and had no complaints or concerns.   Injection given in right upper out quant area. Patient tolerated injection without any complaint of pain or discomfort and agreed to return in 28 days for next due injection.    Patient to call if any issues or problems after injection today or prior to next appointment   Invega sustenna /1.64ml   [facility] # [id number].   LOT# [id number],  EXP  12-27-2023, SN# [id number].  GTIN# [id number]

## 2022-08-19 ENCOUNTER — Other Ambulatory Visit: Payer: Self-pay | Admitting: Psychiatry

## 2022-08-19 DIAGNOSIS — F251 Schizoaffective disorder, depressive type: Secondary | ICD-10-CM

## 2022-08-21 ENCOUNTER — Telehealth: Payer: Self-pay | Admitting: Internal Medicine

## 2022-08-21 NOTE — Telephone Encounter (Signed)
Contacted Tabitha [name] to schedule their annual wellness visit. Appointment made for 09/18/2022.  Verlee Rossetti; Care Guide Ambulatory Clinical Support [place] [facility] Direct Dial: [phone]

## 2022-08-21 NOTE — Progress Notes (Unsigned)
Virtual Visit via Video Note  I connected with Tabitha [name] on 08/24/22 at 10:00 AM EDT by a video enabled telemedicine application and verified that I am speaking with the correct person using two identifiers.  Location: Patient: home Provider: office Persons participated in the visit- patient, provider    I discussed the limitations of evaluation and management by telemedicine and the availability of in person appointments. The patient expressed understanding and agreed to proceed.      I discussed the assessment and treatment plan with the patient. The patient was provided an opportunity to ask questions and all were answered. The patient agreed with the plan and demonstrated an understanding of the instructions.   The patient was advised to call back or seek an in-person evaluation if the symptoms worsen or if the condition fails to improve as anticipated.  I provided 15 minutes of non-face-to-face time during this encounter.   [name], MD    [facility] MD/PA/NP OP Progress Note  08/24/2022 10:36 AM Tabitha [name]  MRN:  [id number]  Chief Complaint:  Chief Complaint  Patient presents with   Follow-up   HPI:  This is a follow-up appointment for schizoaffective disorder, anxiety.  She states that a lot has been going on.  Her vehicle is dying due to issues with the oil.  She has been still trying to help bring her children to the school.  She states that her brain is making a trick on her.  She feels that people are watching her.  She feels nervous even when she is at home.  She sees a car at the corner, and feels that they are watching her.  Although she is trying to tell herself that it is not true, she is sometimes unsure.  She feels exhausted.  She had a good time with her children when they went to an ice cream shop.  She continues to go to church, and will do a choir once a week.  She sometimes feels uncomfortable as she feels others are talking about her.  Although she  sleeps 6 hours, she does not refreshed.  She uses CPAP machine regularly.  She has increase in appetite, and has had weight gain.  She denies SI.  She denies hallucinations.  She denies decreased need for sleep or euphonia.  She denies alcohol use or drug use.  She is willing to try medication adjustment as below.  245 lbs Wt Readings from Last 3 Encounters:  08/11/22 236 lb 9.6 oz (107.3 kg)  07/30/22 232 lb (105.2 kg)  07/06/22 237 lb 12.8 oz (107.9 kg)     Visit Diagnosis:    ICD-10-CM   1. Schizoaffective disorder, depressive type (HCC)  F25.1 Valproic acid level    Hepatic function panel    CBC    2. Obsessive-compulsive disorder, unspecified type  F42.9     3. Anxiety state  F41.1       Past Psychiatric History: Please see initial evaluation for full details. I have reviewed the history. No updates at this time.     Past Medical History:  Past Medical History:  Diagnosis Date   Allergic rhinitis    Anxiety    Asthma    Bipolar disorder (HCC)    Depression    Diabetes mellitus without complication (HCC)    GERD (gastroesophageal reflux disease)    Hyperlipidemia    Hypertension    Mild sleep apnea    OCD (obsessive compulsive disorder)  Palpitations    Paranoia (HCC)    Schizoaffective disorder (HCC)    Sleep apnea     Past Surgical History:  Procedure Laterality Date   CESAREAN SECTION  2006/2008   CHOLECYSTECTOMY  2006    Family Psychiatric History: Please see initial evaluation for full details. I have reviewed the history. No updates at this time.     Family History:  Family History  Problem Relation Age of Onset   Lupus Mother    Diabetes Mother        pre diabetes   Diabetes Father    Hyperlipidemia Father    Diabetes Brother    Breast cancer Maternal Grandmother    Depression Paternal Grandmother    Bipolar disorder Paternal Grandmother    Cancer - Colon Neg Hx     Social History:  Social History   Socioeconomic History   Marital  status: Married    Spouse name: Chiyeko Ferre   Number of children: 2   Years of education: 16   Highest education level: Bachelor's degree (e.g., BA, AB, BS)  Occupational History   Occupation: disability  Tobacco Use   Smoking status: Never   Smokeless tobacco: Never  Vaping Use   Vaping Use: Never used  Substance and Sexual Activity   Alcohol use: Not Currently   Drug use: Never   Sexual activity: Not Currently    Partners: Male    Birth control/protection: Surgical  Other Topics Concern   Not on file  Social History Narrative   Not on file   Social Determinants of Health   Financial Resource Strain: Low Risk  (09/11/2021)   Overall Financial Resource Strain (CARDIA)    Difficulty of Paying Living Expenses: Not hard at all  Food Insecurity: No Food Insecurity (09/11/2021)   Hunger Vital Sign    Worried About Running Out of Food in the Last Year: Never true    Ran Out of Food in the Last Year: Never true  Transportation Needs: No Transportation Needs (09/11/2021)   PRAPARE - Administrator, Civil Service (Medical): No    Lack of Transportation (Non-Medical): No  Physical Activity: Inactive (09/11/2021)   Exercise Vital Sign    Days of Exercise per Week: 0 days    Minutes of Exercise per Session: 0 min  Stress: Stress Concern Present (09/11/2021)   [employer] of Occupational Health - Occupational Stress Questionnaire    Feeling of Stress : Rather much  Social Connections: Socially Integrated (09/11/2021)   Social Connection and Isolation Panel [NHANES]    Frequency of Communication with Friends and Family: Once a week    Frequency of Social Gatherings with Friends and Family: More than three times a week    Attends Religious Services: More than 4 times per year    Active Member of [employer] or Organizations: Yes    Attends Engineer, structural: More than 4 times per year    Marital Status: Married    Allergies:  Allergies  Allergen Reactions    Abilify [Aripiprazole] Hives    Metabolic Disorder Labs: Lab Results  Component Value Date   HGBA1C 8.8 (H) 06/23/2022   MPG 206 06/23/2022   MPG 171 05/21/2021   Lab Results  Component Value Date   PROLACTIN 37.8 (H) 01/22/2022   PROLACTIN 84.8 (H) 06/01/2017   Lab Results  Component Value Date   CHOL 149 06/23/2022   TRIG 449 (H) 06/23/2022   HDL 51 06/23/2022   CHOLHDL  2.9 06/23/2022   VLDL 51 (H) 04/23/2017   LDLCALC  06/23/2022     Comment:     . LDL cholesterol not calculated. Triglyceride levels greater than 400 mg/dL invalidate calculated LDL results. . Reference range: <100 . Desirable range <100 mg/dL for primary prevention;   <70 mg/dL for patients with CHD or diabetic patients  with > or = 2 CHD risk factors. Marland Kitchen LDL-C is now calculated using the Martin-Hopkins  calculation, which is a validated novel method providing  better accuracy than the Friedewald equation in the  estimation of LDL-C.  Horald Pollen et al. Lenox Ahr. 1610;960(45): 2061-2068  (http://education.Quest[employer].com/faq/FAQ164)    LDLCALC 69 03/23/2022   Lab Results  Component Value Date   TSH 1.026 01/22/2022   TSH 1.56 12/17/2021    Therapeutic Level Labs: No results found for: "LITHIUM" Lab Results  Component Value Date   VALPROATE 13 (L) 06/16/2022   VALPROATE 46 (L) 01/22/2022   No results found for: "CBMZ"  Current Medications: Current Outpatient Medications  Medication Sig Dispense Refill   divalproex (DEPAKOTE ER) 250 MG 24 hr tablet Take 1 tablet (250 mg total) by mouth daily. Total of 750 mg at night. Take along with 500 mg tab 30 tablet 1   ACCU-CHEK GUIDE test strip USE TO TEST TWICE DAILY, AS DIRECTED 100 strip 2   albuterol (VENTOLIN HFA) 108 (90 Base) MCG/ACT inhaler INHALE 2 PUFFS INTO THE LUNGS EVERY 6 HOURS AS NEEDED FOR WHEEZING OR SHORTNESS OF BREATH 25.5 g 3   atorvastatin (LIPITOR) 40 MG tablet Take 1 tablet (40 mg total) by mouth daily. 90 tablet 1    divalproex (DEPAKOTE ER) 500 MG 24 hr tablet Take 1 tablet (500 mg total) by mouth daily. 90 tablet 0   EPINEPHrine 0.3 mg/0.3 mL IJ SOAJ injection Inject 0.3 mg into the muscle as needed for anaphylaxis. 1 each 0   ezetimibe (ZETIA) 10 MG tablet TAKE 1 TABLET(10 MG) BY MOUTH DAILY 90 tablet 0   fluticasone (FLOVENT HFA) 110 MCG/ACT inhaler Inhale 1 puff into the lungs 2 (two) times daily. 1 Inhaler 11   gabapentin (NEURONTIN) 600 MG tablet Take 1 tablet (600 mg total) by mouth 3 (three) times daily. 270 tablet 0   glipiZIDE (GLUCOTROL) 10 MG tablet TAKE 1 TABLET(10 MG) BY MOUTH TWICE DAILY BEFORE A MEAL 180 tablet 0   ibuprofen (ADVIL) 200 MG tablet Take 200 mg by mouth every 6 (six) hours as needed.     insulin degludec (TRESIBA FLEXTOUCH) 100 UNIT/ML FlexTouch Pen Inject 44 Units into the skin at bedtime. 15 mL 2   Insulin Pen Needle (BD PEN NEEDLE NANO U/F) 32G X 4 MM MISC 1 each by Does not apply route daily. 100 each 2   INVEGA SUSTENNA 234 MG/1.5ML injection ADMINISTER 1.5 ML IN THE MUSCLE EVERY 28 DAYS 1.5 mL 11   MELATONIN PO Take 5 mg by mouth at bedtime as needed.     metFORMIN (GLUCOPHAGE) 500 MG tablet TAKE 2 TABLETS(1000 MG) BY MOUTH TWICE DAILY WITH A MEAL 360 tablet 0   OLANZapine (ZYPREXA) 7.5 MG tablet Take 1 tablet (7.5 mg total) by mouth at bedtime. 90 tablet 0   omeprazole (PRILOSEC) 10 MG capsule TAKE 1 CAPSULE BY MOUTH DAILY 90 capsule 0   predniSONE (DELTASONE) 20 MG tablet Take 2 tablets daily (40mg ) for 7 days, then take 1 tab daily (20mg ) for 7 days. 21 tablet 0   Vilazodone HCl (VIIBRYD) 40 MG TABS Take 1  tablet (40 mg total) by mouth daily. 90 tablet 1   Current Facility-Administered Medications  Medication Dose Route Frequency Provider Last Rate Last Admin   paliperidone (INVEGA SUSTENNA) injection 234 mg  234 mg Intramuscular Q28 days [name], MD   234 mg at 08/11/22 1319     Musculoskeletal: Strength & Muscle Tone:  normal Gait & Station: normal Patient  leans: N/A  Psychiatric Specialty Exam: Review of Systems  Psychiatric/Behavioral:  Positive for dysphoric mood and sleep disturbance. Negative for agitation, behavioral problems, confusion, decreased concentration, hallucinations, self-injury and suicidal ideas. The patient is nervous/anxious. The patient is not hyperactive.   All other systems reviewed and are negative.   There were no vitals taken for this visit.There is no height or weight on file to calculate BMI.  General Appearance: Fairly Groomed  Eye Contact:  Good  Speech:  Clear and Coherent  Volume:  Normal  Mood:  Anxious  Affect:  Appropriate, Congruent, and distressed  Thought Process:  Coherent  Orientation:  Full (Time, Place, and Person)  Thought Content: Logical   Suicidal Thoughts:  No  Homicidal Thoughts:  No  Memory:  Immediate;   Good  Judgement:  Good  Insight:  Good  Psychomotor Activity:  Normal  Concentration:  Concentration: Good and Attention Span: Good  Recall:  Good  Fund of Knowledge: Good  Language: Good  Akathisia:  No  Handed:  Right  AIMS (if indicated): not done  Assets:  Communication Skills Desire for Improvement  ADL's:  Intact  Cognition: WNL  Sleep:  Poor   Screenings: AIMS    Flowsheet Row Office Visit from 05/12/2017 in [facility] Office Visit from 07/29/2016 in [facility] Office Visit from 05/06/2016 in [facility] Office Visit from 04/01/2016 in [facility] Office Visit from 03/28/2015 in [facility]  AIMS Total Score 0 0 0 0 0      GAD-7    Flowsheet Row Office Visit from 06/23/2022 in [facility] Office Visit from 06/16/2022 in [facility] Office Visit from 05/07/2022 in [facility] Office Visit from 03/23/2022 in [facility] Office Visit from 03/13/2022 in [facility]  Total GAD-7 Score 5 6 19 8 7       Mini-Mental    Flowsheet Row Office Visit from 09/07/2019 in [facility]  Total Score (max 30 points ) 16      PHQ2-9    Flowsheet Row Office Visit from 06/23/2022 in [facility] Office Visit from 06/16/2022 in [facility] Office Visit from 05/07/2022 in [facility] Nutrition from 03/31/2022 in [facility] Visit from 03/23/2022 in [facility]  PHQ-2 Total Score 2 2 2 3 2   PHQ-9 Total Score 9 9 17 12 12       Flowsheet Row Office Visit from 06/16/2022 in [facility] Office Visit from 05/07/2022 in [facility] Office Visit from 01/22/2022 in [facility]  C-SSRS RISK CATEGORY No Risk No Risk No Risk        Assessment and Plan:  TWYLIA OKA is a  52 y.o. year old female with a history of schizoaffective disorder,  sleep apnea (using CPAP machine), diabetes, hypertension, hyperlipidemia, GERD, who presents for follow up appointment for below.    1. Schizoaffective disorder, depressive type (HCC) 2. Obsessive-compulsive disorder, unspecified type 3. Anxiety state Acute stressors include:  Other stressors include: her husband being registered as a sex offender    History: no psych admission    She continues to experience anxiety, and the paranoia since the last visit.  Will uptitrate Depakote to target anxiety and mood dysregulation given she reports good benefit in the past, although it is off-label use.  Discussed potential risk of weight gain, drowsiness.   Will consider uptitration of olanzapine if she has limited benefit from this.  Will continue Invega, olanzapine to target schizoaffective disorder. She benefit from using two antipsychotics, considering she had treatment-resistant hallucinations while on Invega injection.  Will continue Viibryd to target depression, and gabapentin for anxiety.   # prolactin  The level has been trending down.  She denies symptoms of galactorrhea.  Will continue to monitor this.    Plan Continue monthly Invega 234 mg IM - monitor weight gain. She is on metformin.  Continue olanzapine 7.5 mg at night - monitor weight gain Increase Depakote 750 mg daily  Obtain labs (CMP, LFT, VPA) at [facility], five days after increasing Depakote Continue Viibryd 40 mg daily Continue gabapentin 600 mg 3 times a day Obtain EKG - please call [phone] to make an appointment  Next appointment- 6/11 8 am, video - She [facility]CP at [facility] - on metformin   Past trials of medication: Abilify (hives), olanzapine, quetiapine, risperidone (stopped working),         Secretary/administrator of Care: Collaboration of Care: Other reviewed notes in Epic  Patient/Guardian was advised Release of Information must be obtained prior to any record release in order to collaborate their care with an outside provider. Patient/Guardian was advised if they have not already done so to contact the registration department to sign all necessary forms in order for [place] to release information regarding their care.   Consent: Patient/Guardian gives verbal consent for treatment and assignment of benefits for services provided during this visit. Patient/Guardian expressed understanding and agreed to proceed.    [name], MD 08/24/2022, 10:36 AM

## 2022-08-23 DIAGNOSIS — G4733 Obstructive sleep apnea (adult) (pediatric): Secondary | ICD-10-CM | POA: Diagnosis not present

## 2022-08-24 ENCOUNTER — Encounter: Payer: Self-pay | Admitting: Psychiatry

## 2022-08-24 ENCOUNTER — Telehealth (INDEPENDENT_AMBULATORY_CARE_PROVIDER_SITE_OTHER): Payer: Medicare Other | Admitting: Psychiatry

## 2022-08-24 DIAGNOSIS — F411 Generalized anxiety disorder: Secondary | ICD-10-CM | POA: Diagnosis not present

## 2022-08-24 DIAGNOSIS — F251 Schizoaffective disorder, depressive type: Secondary | ICD-10-CM

## 2022-08-24 DIAGNOSIS — F429 Obsessive-compulsive disorder, unspecified: Secondary | ICD-10-CM | POA: Diagnosis not present

## 2022-08-24 MED ORDER — DIVALPROEX SODIUM ER 250 MG PO TB24: 250.0000 mg | ORAL_TABLET | Freq: Every day | ORAL | 1 refills | Status: DC

## 2022-08-24 NOTE — Patient Instructions (Signed)
Continue monthly Invega 234 mg IM  Continue olanzapine 7.5 mg at night Increase Depakote 750 mg daily  Obtain labs (CMP, LFT, VPA) at [facility], five days after increasing Depakote Continue Viibryd 40 mg daily Continue gabapentin 600 mg 3 times a day Obtain EKG - please call [phone] to make an appointment  Next appointment- 6/11 8 am

## 2022-09-08 ENCOUNTER — Ambulatory Visit (INDEPENDENT_AMBULATORY_CARE_PROVIDER_SITE_OTHER): Payer: Medicare Other

## 2022-09-08 VITALS — BP 134/80 | HR 82 | Temp 97.2°F | Ht 62.0 in | Wt 243.2 lb

## 2022-09-08 DIAGNOSIS — F259 Schizoaffective disorder, unspecified: Secondary | ICD-10-CM

## 2022-09-08 NOTE — Patient Instructions (Signed)
Patient in today for due every 28 day Invega Sustenna 234 mg/1.5 ml injection. Patient presented clean and answered questions. Patient did mentioned that she felt she could tell it was time for her injection she stated that she experienced some anxiety and depression. Patient denied any auditory or visual hallucinations, no suicidal or homicidal ideations or plans or intent to harm self or others. Patient reported still doing fine with every 28 day dosage and had no complaints or concerns.   Injection given in left upper out quant area. Patient tolerated injection without any complaint of pain or discomfort and agreed to return in 28 days for next due injection.    Patient to call if any issues or problems after injection today or prior to next appointment      [facility] 21308-657-84   EXP 01/25/2024   GTIN [id number]   S/N [id number] LOT [id number]

## 2022-09-08 NOTE — Progress Notes (Signed)
Patient in today for due every 28 day Invega Sustenna 234 mg/1.5 ml injection. Patient presented clean and answered questions. Patient did mentioned that she felt she could tell it was time for her injection she stated that she experienced some anxiety and depression. Patient denied any auditory or visual hallucinations, no suicidal or homicidal ideations or plans or intent to harm self or others. Patient reported still doing fine with every 28 day dosage and had no complaints or concerns.   Injection given in left upper out quant area. Patient tolerated injection without any complaint of pain or discomfort and agreed to return in 28 days for next due injection.    Patient to call if any issues or problems after injection today or prior to next appointment     NDC 50458-564-01   EXP 01/25/2024   GTIN [id number]   S/N [id number] LOT [id number] 

## 2022-09-18 ENCOUNTER — Ambulatory Visit (INDEPENDENT_AMBULATORY_CARE_PROVIDER_SITE_OTHER): Payer: Medicare Other

## 2022-09-18 ENCOUNTER — Telehealth: Payer: Self-pay

## 2022-09-18 VITALS — Ht 62.0 in | Wt 232.0 lb

## 2022-09-18 DIAGNOSIS — Z5941 Food insecurity: Secondary | ICD-10-CM

## 2022-09-18 DIAGNOSIS — Z0000 Encounter for general adult medical examination without abnormal findings: Secondary | ICD-10-CM | POA: Diagnosis not present

## 2022-09-18 NOTE — Addendum Note (Signed)
Addended by: Hal Hope on: 09/18/2022 01:25 PM   Modules accepted: Orders

## 2022-09-18 NOTE — Telephone Encounter (Signed)
   Telephone encounter was:  Unsuccessful.  09/18/2022 Name: Tabitha [name] MRN: [id number] DOB: [birth year]  Unsuccessful outbound call made today to assist with:  Food Insecurity and Financial Difficulties related to FINANCIAL STRAIN  Outreach Attempt:  1st Attempt  A HIPAA compliant voice message was left requesting a return call.  Instructed patient to call back .    Lenard Forth [facility] Guide, [facility] [phone] [address] [place], [place], [place] [address] Phone: [phone] Email: [email or web address]

## 2022-09-18 NOTE — Patient Instructions (Signed)
Tabitha [name] , Thank you for taking time to come for your Medicare Wellness Visit. I appreciate your ongoing commitment to your health goals. Please review the following plan we discussed and let me know if I can assist you in the future.   These are the goals we discussed:  Goals      DIET - EAT MORE FRUITS AND VEGETABLES     DIET - INCREASE WATER INTAKE     Recommend drinking at least 6-8 glasses of water a day      Patient Stated     09/03/2020, wants to weigh under 200 pounds        This is a list of the screening recommended for you and due dates:  Health Maintenance  Topic Date Due   Mammogram  Never done   Zoster (Shingles) Vaccine (1 of 2) Never done   COVID-19 Vaccine (1) 12/27/2022*   Flu Shot  11/26/2022   Hemoglobin A1C  12/22/2022   Eye exam for diabetics  03/17/2023   Yearly kidney health urinalysis for diabetes  03/24/2023   Complete foot exam   03/24/2023   Yearly kidney function blood test for diabetes  06/24/2023   Medicare Annual Wellness Visit  09/18/2023   DTaP/Tdap/Td vaccine (2 - Td or Tdap) 01/22/2025   Cologuard (Stool DNA test)  07/05/2025   Pap Smear  06/24/2027   Hepatitis C Screening  Completed   HPV Vaccine  Aged Out   Colon Cancer Screening  Discontinued   HIV Screening  Discontinued  *Topic was postponed. The date shown is not the original due date.    Advanced directives: no  Conditions/risks identified: none  Next appointment: Follow up in one year for your annual wellness visit. 09/24/23 @ 10:15 am by phone  Preventive Care 40-64 Years, Female Preventive care refers to lifestyle choices and visits with your health care provider that can promote health and wellness. What does preventive care include? A yearly physical exam. This is also called an annual well check. Dental exams once or twice a year. Routine eye exams. Ask your health care provider how often you should have your eyes checked. Personal lifestyle choices, including: Daily  care of your teeth and gums. Regular physical activity. Eating a healthy diet. Avoiding tobacco and drug use. Limiting alcohol use. Practicing safe sex. Taking low-dose aspirin daily starting at age 53. Taking vitamin and mineral supplements as recommended by your health care provider. What happens during an annual well check? The services and screenings done by your health care provider during your annual well check will depend on your age, overall health, lifestyle risk factors, and family history of disease. Counseling  Your health care provider may ask you questions about your: Alcohol use. Tobacco use. Drug use. Emotional well-being. Home and relationship well-being. Sexual activity. Eating habits. Work and work [employer]. Method of birth control. Menstrual cycle. Pregnancy history. Screening  You may have the following tests or measurements: Height, weight, and BMI. Blood pressure. Lipid and cholesterol levels. These may be checked every 5 years, or more frequently if you are over 21 years old. Skin check. Lung cancer screening. You may have this screening every year starting at age 59 if you have a 30-pack-year history of smoking and currently smoke or have quit within the past 15 years. Fecal occult blood test (FOBT) of the stool. You may have this test every year starting at age 22. Flexible sigmoidoscopy or colonoscopy. You may have a sigmoidoscopy every 5 years  or a colonoscopy every 10 years starting at age 44. Hepatitis C blood test. Hepatitis B blood test. Sexually transmitted disease (STD) testing. Diabetes screening. This is done by checking your blood sugar (glucose) after you have not eaten for a while (fasting). You may have this done every 1-3 years. Mammogram. This may be done every 1-2 years. Talk to your health care provider about when you should start having regular mammograms. This may depend on whether you have a family history of breast  cancer. BRCA-related cancer screening. This may be done if you have a family history of breast, ovarian, tubal, or peritoneal cancers. Pelvic exam and Pap test. This may be done every 3 years starting at age 2. Starting at age 30, this may be done every 5 years if you have a Pap test in combination with an HPV test. Bone density scan. This is done to screen for osteoporosis. You may have this scan if you are at high risk for osteoporosis. Discuss your test results, treatment options, and if necessary, the need for more tests with your health care provider. Vaccines  Your health care provider may recommend certain vaccines, such as: Influenza vaccine. This is recommended every year. Tetanus, diphtheria, and acellular pertussis (Tdap, Td) vaccine. You may need a Td booster every 10 years. Zoster vaccine. You may need this after age 3. Pneumococcal 13-valent conjugate (PCV13) vaccine. You may need this if you have certain conditions and were not previously vaccinated. Pneumococcal polysaccharide (PPSV23) vaccine. You may need one or two doses if you smoke cigarettes or if you have certain conditions. Talk to your health care provider about which screenings and vaccines you need and how often you need them. This information is not intended to replace advice given to you by your health care provider. Make sure you discuss any questions you have with your health care provider. Document Released: 05/10/2015 Document Revised: 01/01/2016 Document Reviewed: 02/12/2015 Elsevier Interactive Patient Education  2017 [employer].    Fall Prevention in the Home Falls can cause injuries. They can happen to people of all ages. There are many things you can do to make your home safe and to help prevent falls. What can I do on the outside of my home? Regularly fix the edges of walkways and driveways and fix any cracks. Remove anything that might make you trip as you walk through a door, such as a raised step  or threshold. Trim any bushes or trees on the path to your home. Use bright outdoor lighting. Clear any walking paths of anything that might make someone trip, such as rocks or tools. Regularly check to see if handrails are loose or broken. Make sure that both sides of any steps have handrails. Any raised decks and porches should have guardrails on the edges. Have any leaves, snow, or ice cleared regularly. Use sand or salt on walking paths during winter. Clean up any spills in your garage right away. This includes oil or grease spills. What can I do in the bathroom? Use night lights. Install grab bars by the toilet and in the tub and shower. Do not use towel bars as grab bars. Use non-skid mats or decals in the tub or shower. If you need to sit down in the shower, use a plastic, non-slip stool. Keep the floor dry. Clean up any water that spills on the floor as soon as it happens. Remove soap buildup in the tub or shower regularly. Attach bath mats securely with double-sided non-slip  rug tape. Do not have throw rugs and other things on the floor that can make you trip. What can I do in the bedroom? Use night lights. Make sure that you have a light by your bed that is easy to reach. Do not use any sheets or blankets that are too big for your bed. They should not hang down onto the floor. Have a firm chair that has side arms. You can use this for support while you get dressed. Do not have throw rugs and other things on the floor that can make you trip. What can I do in the kitchen? Clean up any spills right away. Avoid walking on wet floors. Keep items that you use a lot in easy-to-reach places. If you need to reach something above you, use a strong step stool that has a grab bar. Keep electrical cords out of the way. Do not use floor polish or wax that makes floors slippery. If you must use wax, use non-skid floor wax. Do not have throw rugs and other things on the floor that can make  you trip. What can I do with my stairs? Do not leave any items on the stairs. Make sure that there are handrails on both sides of the stairs and use them. Fix handrails that are broken or loose. Make sure that handrails are as long as the stairways. Check any carpeting to make sure that it is firmly attached to the stairs. Fix any carpet that is loose or worn. Avoid having throw rugs at the top or bottom of the stairs. If you do have throw rugs, attach them to the floor with carpet tape. Make sure that you have a light switch at the top of the stairs and the bottom of the stairs. If you do not have them, ask someone to add them for you. What else can I do to help prevent falls? Wear shoes that: Do not have high heels. Have rubber bottoms. Are comfortable and fit you well. Are closed at the toe. Do not wear sandals. If you use a stepladder: Make sure that it is fully opened. Do not climb a closed stepladder. Make sure that both sides of the stepladder are locked into place. Ask someone to hold it for you, if possible. Clearly mark and make sure that you can see: Any grab bars or handrails. First and last steps. Where the edge of each step is. Use tools that help you move around (mobility aids) if they are needed. These include: Canes. Walkers. Scooters. Crutches. Turn on the lights when you go into a dark area. Replace any light bulbs as soon as they burn out. Set up your furniture so you have a clear path. Avoid moving your furniture around. If any of your floors are uneven, fix them. If there are any pets around you, be aware of where they are. Review your medicines with your doctor. Some medicines can make you feel dizzy. This can increase your chance of falling. Ask your doctor what other things that you can do to help prevent falls. This information is not intended to replace advice given to you by your health care provider. Make sure you discuss any questions you have with your  health care provider. Document Released: 02/07/2009 Document Revised: 09/19/2015 Document Reviewed: 05/18/2014 Elsevier Interactive Patient Education  2017 [employer].

## 2022-09-18 NOTE — Progress Notes (Signed)
I connected with  Tabitha [name] on 09/18/22 by a audio enabled telemedicine application and verified that I am speaking with the correct person using two identifiers.  Patient Location: Home  Provider Location: Office/Clinic  I discussed the limitations of evaluation and management by telemedicine. The patient expressed understanding and agreed to proceed.  Subjective:   Tabitha [name] is a 52 y.o. female who presents for Medicare Annual (Subsequent) preventive examination.  Review of Systems     Cardiac Risk Factors include: advanced age (>62men, >66 women);diabetes mellitus;hypertension;obesity (BMI >30kg/m2);sedentary lifestyle     Objective:    There were no vitals filed for this visit. There is no height or weight on file to calculate BMI.     09/18/2022   11:10 AM 03/31/2022    8:42 AM 09/03/2020    2:42 PM 01/19/2019    2:04 PM 06/06/2018    2:52 PM 01/13/2018    2:43 PM 02/15/2017   11:59 AM  Advanced Directives  Does Patient Have a Medical Advance Directive? No No No No  No   Would patient like information on creating a medical advance directive? No - Patient declined No - Patient declined    Yes (MAU/Ambulatory/Procedural Areas - Information given)      Information is confidential and restricted. Go to Review Flowsheets to unlock data.    Current Medications (verified) Outpatient Encounter Medications as of 09/18/2022  Medication Sig   ACCU-CHEK GUIDE test strip USE TO TEST TWICE DAILY, AS DIRECTED   albuterol (VENTOLIN HFA) 108 (90 Base) MCG/ACT inhaler INHALE 2 PUFFS INTO THE LUNGS EVERY 6 HOURS AS NEEDED FOR WHEEZING OR SHORTNESS OF BREATH   atorvastatin (LIPITOR) 40 MG tablet Take 1 tablet (40 mg total) by mouth daily.   divalproex (DEPAKOTE ER) 250 MG 24 hr tablet Take 1 tablet (250 mg total) by mouth daily. Total of 750 mg at night. Take along with 500 mg tab   divalproex (DEPAKOTE ER) 500 MG 24 hr tablet Take 1 tablet (500 mg total) by mouth daily.    EPINEPHrine 0.3 mg/0.3 mL IJ SOAJ injection Inject 0.3 mg into the muscle as needed for anaphylaxis.   ezetimibe (ZETIA) 10 MG tablet TAKE 1 TABLET(10 MG) BY MOUTH DAILY   fluticasone (FLOVENT HFA) 110 MCG/ACT inhaler Inhale 1 puff into the lungs 2 (two) times daily.   gabapentin (NEURONTIN) 600 MG tablet Take 1 tablet (600 mg total) by mouth 3 (three) times daily.   glipiZIDE (GLUCOTROL) 10 MG tablet TAKE 1 TABLET(10 MG) BY MOUTH TWICE DAILY BEFORE A MEAL   ibuprofen (ADVIL) 200 MG tablet Take 200 mg by mouth every 6 (six) hours as needed.   insulin degludec (TRESIBA FLEXTOUCH) 100 UNIT/ML FlexTouch Pen Inject 44 Units into the skin at bedtime.   Insulin Pen Needle (BD PEN NEEDLE NANO U/F) 32G X 4 MM MISC 1 each by Does not apply route daily.   INVEGA SUSTENNA 234 MG/1.5ML injection ADMINISTER 1.5 ML IN THE MUSCLE EVERY 28 DAYS   MELATONIN PO Take 5 mg by mouth at bedtime as needed.   metFORMIN (GLUCOPHAGE) 500 MG tablet TAKE 2 TABLETS(1000 MG) BY MOUTH TWICE DAILY WITH A MEAL   OLANZapine (ZYPREXA) 7.5 MG tablet Take 1 tablet (7.5 mg total) by mouth at bedtime.   omeprazole (PRILOSEC) 10 MG capsule TAKE 1 CAPSULE BY MOUTH DAILY   Vilazodone HCl (VIIBRYD) 40 MG TABS Take 1 tablet (40 mg total) by mouth daily.   predniSONE (DELTASONE) 20 MG tablet  Take 2 tablets daily (40mg ) for 7 days, then take 1 tab daily (20mg ) for 7 days. (Patient not taking: Reported on 09/18/2022)   Facility-Administered Encounter Medications as of 09/18/2022  Medication   paliperidone (INVEGA SUSTENNA) injection 234 mg    Allergies (verified) Abilify [aripiprazole]   History: Past Medical History:  Diagnosis Date   Allergic rhinitis    Anxiety    Asthma    Bipolar disorder (HCC)    Depression    Diabetes mellitus without complication (HCC)    GERD (gastroesophageal reflux disease)    Hyperlipidemia    Hypertension    Mild sleep apnea    OCD (obsessive compulsive disorder)    Palpitations    Paranoia (HCC)     Schizoaffective disorder (HCC)    Sleep apnea    Past Surgical History:  Procedure Laterality Date   CESAREAN SECTION  2006/2008   CHOLECYSTECTOMY  2006   Family History  Problem Relation Age of Onset   Lupus Mother    Diabetes Mother        pre diabetes   Diabetes Father    Hyperlipidemia Father    Diabetes Brother    Breast cancer Maternal Grandmother    Depression Paternal Grandmother    Bipolar disorder Paternal Grandmother    Cancer - Colon Neg Hx    Social History   Socioeconomic History   Marital status: Married    Spouse name: Tabitha [name]   Number of children: 2   Years of education: 16   Highest education level: Bachelor's degree (e.g., BA, AB, BS)  Occupational History   Occupation: disability  Tobacco Use   Smoking status: Never   Smokeless tobacco: Never  Vaping Use   Vaping Use: Never used  Substance and Sexual Activity   Alcohol use: Not Currently   Drug use: Never   Sexual activity: Not Currently    Partners: Male    Birth control/protection: Surgical  Other Topics Concern   Not on file  Social History Narrative   Not on file   Social Determinants of Health   Financial Resource Strain: Medium Risk (09/18/2022)   Overall Financial Resource Strain (CARDIA)    Difficulty of Paying Living Expenses: Somewhat hard  Food Insecurity: Food Insecurity Present (09/18/2022)   Hunger Vital Sign    Worried About Running Out of Food in the Last Year: Sometimes true    Ran Out of Food in the Last Year: Sometimes true  Transportation Needs: No Transportation Needs (09/18/2022)   PRAPARE - Administrator, Civil Service (Medical): No    Lack of Transportation (Non-Medical): No  Physical Activity: Inactive (09/18/2022)   Exercise Vital Sign    Days of Exercise per Week: 0 days    Minutes of Exercise per Session: 0 min  Stress: No Stress Concern Present (09/18/2022)   [employer] of Occupational Health - Occupational Stress Questionnaire     Feeling of Stress : Only a little  Social Connections: Moderately Integrated (09/18/2022)   Social Connection and Isolation Panel [NHANES]    Frequency of Communication with Friends and Family: More than three times a week    Frequency of Social Gatherings with Friends and Family: Not on file    Attends Religious Services: Never    Database administrator or Organizations: Yes    Attends Engineer, structural: More than 4 times per year    Marital Status: Married    Tobacco Counseling Counseling given: Not Answered  Clinical Intake:  Pre-visit preparation completed: Yes  Pain : No/denies pain     Nutritional Risks: None Diabetes: Yes CBG done?: No Did pt. bring in CBG monitor from home?: No  How often do you need to have someone help you when you read instructions, pamphlets, or other written materials from your doctor or pharmacy?: 1 - Never  Diabetic?yes Nutrition Risk Assessment:  Has the patient had any N/V/D within the last 2 months?  Yes  Does the patient have any non-healing wounds?  No  Has the patient had any unintentional weight loss or weight gain?  No   Diabetes:  Is the patient diabetic?  Yes  If diabetic, was a CBG obtained today?  No  Did the patient bring in their glucometer from home?  No  How often do you monitor your CBG's? Twice per day.   Financial Strains and Diabetes Management:  Are you having any financial strains with the device, your supplies or your medication? No .  Does the patient want to be seen by Chronic Care Management for management of their diabetes?  No  Would the patient like to be referred to a Nutritionist or for Diabetic Management?  No   Diabetic Exams:  Diabetic Eye Exam: Completed 03/16/22.  Pt has been advised about the importance in completing this exam.  Diabetic Foot Exam: Completed 03/23/22. Pt has been advised about the importance in completing this exam.    Interpreter Needed?: No  Information entered  by :: Kennedy Bucker, LPN   Activities of Daily Living    09/18/2022   11:11 AM 09/18/2022    8:26 AM  In your present state of health, do you have any difficulty performing the following activities:  Hearing? 1 1  Vision? 1 1  Difficulty concentrating or making decisions? 1 1  Walking or climbing stairs? 1 1  Dressing or bathing? 0 0  Doing errands, shopping? 1 1  Preparing Food and eating ? N N  Using the Toilet? N N  In the past six months, have you accidently leaked urine? Y Y  Do you have problems with loss of bowel control? N N  Managing your Medications? Y Y  Managing your Finances? N N  Housekeeping or managing your Housekeeping? Malvin Johns    Patient Care Team: Lorre Munroe, NP as PCP - General (Internal Medicine)  Indicate any recent Medical Services you may have received from other than [facility] providers in the past year (date may be approximate).     Assessment:   This is a routine wellness examination for [place].  Hearing/Vision screen Hearing Screening - Comments:: No aids Vision Screening - Comments:: Wears glasses-  Eye  Dietary issues and exercise activities discussed: Current Exercise Habits: The patient does not participate in regular exercise at present   Goals Addressed             This Visit's Progress    DIET - EAT MORE FRUITS AND VEGETABLES         Depression Screen    09/18/2022   11:07 AM 06/23/2022    9:58 AM 06/16/2022   10:59 AM 05/07/2022   11:14 AM 03/31/2022    8:43 AM 03/23/2022   10:04 AM 03/13/2022    2:45 PM  PHQ 2/9 Scores  PHQ - 2 Score 6 2   3 2 2   PHQ- 9 Score 9 9   12 12 8      Information is confidential and restricted. Go to  Review Flowsheets to unlock data.    Fall Risk    09/18/2022   11:11 AM 09/18/2022    8:26 AM 06/23/2022    9:59 AM 05/26/2022    3:25 PM 03/31/2022    8:43 AM  Fall Risk   Falls in the past year? 0 0 0 0 0  Number falls in past yr: 0 0     Injury with Fall? 0 0 0    Risk for fall due  to : No Fall Risks  No Fall Risks    Follow up Falls prevention discussed;Falls evaluation completed        FALL RISK PREVENTION PERTAINING TO THE HOME:  Any stairs in or around the home? No  If so, are there any without handrails? No  Home free of loose throw rugs in walkways, pet beds, electrical cords, etc? Yes  Adequate lighting in your home to reduce risk of falls? Yes   ASSISTIVE DEVICES UTILIZED TO PREVENT FALLS:  Life alert? No  Use of a cane, walker or w/c? No  Grab bars in the bathroom? Yes  Shower chair or bench in shower? Yes  Elevated toilet seat or a handicapped toilet? No    Cognitive Function:    09/11/2019    8:57 AM  MMSE - Mini Mental State Exam  Orientation to time 1  Orientation to Place 5  Registration 1  Attention/ Calculation 0  Recall 1  Language- name 2 objects 2  Language- repeat 1  Language- follow 3 step command 3  Language- read & follow direction 1  Write a sentence 1  Copy design 0  Total score 16        09/18/2022   11:17 AM 09/11/2021    1:22 PM 09/03/2020    2:49 PM 01/13/2018    2:46 PM 01/13/2017    3:14 PM  6CIT Screen  What Year? 0 points 0 points 0 points 0 points 0 points  What month? 0 points 0 points 0 points 0 points 0 points  What time? 0 points 0 points 0 points 0 points 0 points  Count back from 20 0 points 0 points 2 points 0 points 0 points  Months in reverse 0 points 0 points 4 points 0 points 0 points  Repeat phrase 0 points 0 points 10 points 0 points 4 points  Total Score 0 points 0 points 16 points 0 points 4 points    Immunizations Immunization History  Administered Date(s) Administered   Influenza,inj,Quad PF,6+ Mos 01/16/2016, 01/13/2017, 01/13/2018, 01/07/2021, 12/16/2021   Influenza-Unspecified 02/07/2015   PNEUMOCOCCAL CONJUGATE-20 05/21/2021   Pneumococcal Polysaccharide-23 04/29/2015   Tdap 01/23/2015    TDAP status: Up to date  Flu Vaccine status: Up to date  Pneumococcal vaccine status: Up  to date  Covid-19 vaccine status: Declined, Education has been provided regarding the importance of this vaccine but patient still declined. Advised may receive this vaccine at local pharmacy or Health Dept.or vaccine clinic. Aware to provide a copy of the vaccination record if obtained from local pharmacy or Health Dept. Verbalized acceptance and understanding.  Qualifies for Shingles Vaccine? Yes   Zostavax completed No   Shingrix Completed?: No.    Education has been provided regarding the importance of this vaccine. Patient has been advised to call insurance company to determine out of pocket expense if they have not yet received this vaccine. Advised may also receive vaccine at local pharmacy or Health Dept. Verbalized acceptance and understanding.  Screening Tests  Health Maintenance  Topic Date Due   MAMMOGRAM  Never done   Zoster Vaccines- Shingrix (1 of 2) Never done   COVID-19 Vaccine (1) 12/27/2022 (Originally 10/09/1971)   INFLUENZA VACCINE  11/26/2022   HEMOGLOBIN A1C  12/22/2022   OPHTHALMOLOGY EXAM  03/17/2023   Diabetic kidney evaluation - Urine ACR  03/24/2023   FOOT EXAM  03/24/2023   Diabetic kidney evaluation - eGFR measurement  06/24/2023   Medicare Annual Wellness (AWV)  09/18/2023   DTaP/Tdap/Td (2 - Td or Tdap) 01/22/2025   Fecal DNA (Cologuard)  07/05/2025   PAP SMEAR-Modifier  06/24/2027   Hepatitis C Screening  Completed   HPV VACCINES  Aged Out   Colonoscopy  Discontinued   HIV Screening  Discontinued    Health Maintenance  Health Maintenance Due  Topic Date Due   MAMMOGRAM  Never done   Zoster Vaccines- Shingrix (1 of 2) Never done    Colorectal cancer screening: Type of screening: Cologuard. Completed 07/06/22. Repeat every 3 years  Mammogram status: Ordered 06/23/22. Pt provided with contact info and advised to call to schedule appt.    Lung Cancer Screening: (Low Dose CT Chest recommended if Age 26-80 years, 30 pack-year currently smoking OR have  quit w/in 15years.) does not qualify.   Additional Screening:  Hepatitis C Screening: does qualify; Completed 05/21/21  Vision Screening: Recommended annual ophthalmology exams for early detection of glaucoma and other disorders of the eye. Is the patient up to date with their annual eye exam?  Yes  Who is the provider or what is the name of the office in which the patient attends annual eye exams? [facility] If pt is not established with a provider, would they like to be referred to a provider to establish care? No .   Dental Screening: Recommended annual dental exams for proper oral hygiene  Community Resource Referral / Chronic Care Management: CRR required this visit?  No   CCM required this visit?  No      Plan:     I have personally reviewed and noted the following in the patient's chart:   Medical and social history Use of alcohol, tobacco or illicit drugs  Current medications and supplements including opioid prescriptions. Patient is not currently taking opioid prescriptions. Functional ability and status Nutritional status Physical activity Advanced directives List of other physicians Hospitalizations, surgeries, and ER visits in previous 12 months Vitals Screenings to include cognitive, depression, and falls Referrals and appointments  In addition, I have reviewed and discussed with patient certain preventive protocols, quality metrics, and best practice recommendations. A written personalized care plan for preventive services as well as general preventive health recommendations were provided to patient.     Hal Hope, LPN   08/03/8117   Nurse Notes: none

## 2022-09-22 ENCOUNTER — Ambulatory Visit: Payer: Medicare Other | Admitting: Internal Medicine

## 2022-09-22 ENCOUNTER — Telehealth: Payer: Self-pay

## 2022-09-22 NOTE — Telephone Encounter (Signed)
   Telephone encounter was:  Unsuccessful.  09/22/2022 Name: TAMECCA GEDNEY MRN: [id number] DOB: February 16, 1971  Unsuccessful outbound call made today to assist with: Food and Financial  Outreach Attempt:  2nd Attempt  A HIPAA compliant voice message was left requesting a return call.  Instructed patient to call back.    Lenard Forth [facility] Guide, [facility] [phone] [address] [place], [place], [place] [address] Phone: [phone] Email: [email or web address]

## 2022-09-24 ENCOUNTER — Telehealth: Payer: Self-pay

## 2022-09-24 NOTE — Telephone Encounter (Signed)
   Telephone encounter was:  Unsuccessful.  09/24/2022 Name: Tabitha [name] MRN: [id number] DOB: 11/04/1970  Unsuccessful outbound call made today to assist with:  Food Insecurity and Financial Difficulties related to    Outreach Attempt:  3rd Attempt.  Referral closed unable to contact patient.  A HIPAA compliant voice message was left requesting a return call.  Instructed patient to call back.    Lenard Forth [facility] Guide, [facility] [phone] [address] [place], [place], [place] [address] Phone: [phone] Email: [email or web address]

## 2022-09-26 NOTE — Progress Notes (Deleted)
BH MD/PA/NP OP Progress Note  09/26/2022 5:47 PM Tabitha [name]  MRN:  [id number]  Chief Complaint: No chief complaint on file.  HPI: *** Visit Diagnosis: No diagnosis found.  Past Psychiatric History: Please see initial evaluation for full details. I have reviewed the history. No updates at this time.     Past Medical History:  Past Medical History:  Diagnosis Date   Allergic rhinitis    Anxiety    Asthma    Bipolar disorder (HCC)    Depression    Diabetes mellitus without complication (HCC)    GERD (gastroesophageal reflux disease)    Hyperlipidemia    Hypertension    Mild sleep apnea    OCD (obsessive compulsive disorder)    Palpitations    Paranoia (HCC)    Schizoaffective disorder (HCC)    Sleep apnea     Past Surgical History:  Procedure Laterality Date   CESAREAN SECTION  2006/2008   CHOLECYSTECTOMY  2006    Family Psychiatric History: Please see initial evaluation for full details. I have reviewed the history. No updates at this time.     Family History:  Family History  Problem Relation Age of Onset   Lupus Mother    Diabetes Mother        pre diabetes   Diabetes Father    Hyperlipidemia Father    Diabetes Brother    Breast cancer Maternal Grandmother    Depression Paternal Grandmother    Bipolar disorder Paternal Grandmother    Cancer - Colon Neg Hx     Social History:  Social History   Socioeconomic History   Marital status: Married    Spouse name: Lagena Haba   Number of children: 2   Years of education: 16   Highest education level: Bachelor's degree (e.g., BA, AB, BS)  Occupational History   Occupation: disability  Tobacco Use   Smoking status: Never   Smokeless tobacco: Never  Vaping Use   Vaping Use: Never used  Substance and Sexual Activity   Alcohol use: Not Currently   Drug use: Never   Sexual activity: Not Currently    Partners: Male    Birth control/protection: Surgical  Other Topics Concern   Not on file  Social  History Narrative   Not on file   Social Determinants of Health   Financial Resource Strain: Medium Risk (09/18/2022)   Overall Financial Resource Strain (CARDIA)    Difficulty of Paying Living Expenses: Somewhat hard  Food Insecurity: Food Insecurity Present (09/18/2022)   Hunger Vital Sign    Worried About Running Out of Food in the Last Year: Sometimes true    Ran Out of Food in the Last Year: Sometimes true  Transportation Needs: No Transportation Needs (09/18/2022)   PRAPARE - Administrator, Civil Service (Medical): No    Lack of Transportation (Non-Medical): No  Physical Activity: Inactive (09/18/2022)   Exercise Vital Sign    Days of Exercise per Week: 0 days    Minutes of Exercise per Session: 0 min  Stress: No Stress Concern Present (09/18/2022)   [employer] of Occupational Health - Occupational Stress Questionnaire    Feeling of Stress : Only a little  Social Connections: Moderately Integrated (09/18/2022)   Social Connection and Isolation Panel [NHANES]    Frequency of Communication with Friends and Family: More than three times a week    Frequency of Social Gatherings with Friends and Family: Not on file    Attends Religious  Services: Never    Active Member of Clubs or Organizations: Yes    Attends Engineer, structural: More than 4 times per year    Marital Status: Married    Allergies:  Allergies  Allergen Reactions   Abilify [Aripiprazole] Hives    Metabolic Disorder Labs: Lab Results  Component Value Date   HGBA1C 8.8 (H) 06/23/2022   MPG 206 06/23/2022   MPG 171 05/21/2021   Lab Results  Component Value Date   PROLACTIN 37.8 (H) 01/22/2022   PROLACTIN 84.8 (H) 06/01/2017   Lab Results  Component Value Date   CHOL 149 06/23/2022   TRIG 449 (H) 06/23/2022   HDL 51 06/23/2022   CHOLHDL 2.9 06/23/2022   VLDL 51 (H) 04/23/2017   LDLCALC  06/23/2022     Comment:     . LDL cholesterol not calculated. Triglyceride  levels greater than 400 mg/dL invalidate calculated LDL results. . Reference range: <100 . Desirable range <100 mg/dL for primary prevention;   <70 mg/dL for patients with CHD or diabetic patients  with > or = 2 CHD risk factors. Marland Kitchen LDL-C is now calculated using the Martin-Hopkins  calculation, which is a validated novel method providing  better accuracy than the Friedewald equation in the  estimation of LDL-C.  Horald Pollen et al. Lenox Ahr. 1610;960(45): 2061-2068  (http://education.[employer].com/faq/FAQ164)    LDLCALC 69 03/23/2022   Lab Results  Component Value Date   TSH 1.026 01/22/2022   TSH 1.56 12/17/2021    Therapeutic Level Labs: No results found for: "LITHIUM" Lab Results  Component Value Date   VALPROATE 13 (L) 06/16/2022   VALPROATE 46 (L) 01/22/2022   No results found for: "CBMZ"  Current Medications: Current Outpatient Medications  Medication Sig Dispense Refill   ACCU-CHEK GUIDE test strip USE TO TEST TWICE DAILY, AS DIRECTED 100 strip 2   albuterol (VENTOLIN HFA) 108 (90 Base) MCG/ACT inhaler INHALE 2 PUFFS INTO THE LUNGS EVERY 6 HOURS AS NEEDED FOR WHEEZING OR SHORTNESS OF BREATH 25.5 g 3   atorvastatin (LIPITOR) 40 MG tablet Take 1 tablet (40 mg total) by mouth daily. 90 tablet 1   divalproex (DEPAKOTE ER) 250 MG 24 hr tablet Take 1 tablet (250 mg total) by mouth daily. Total of 750 mg at night. Take along with 500 mg tab 30 tablet 1   divalproex (DEPAKOTE ER) 500 MG 24 hr tablet Take 1 tablet (500 mg total) by mouth daily. 90 tablet 0   EPINEPHrine 0.3 mg/0.3 mL IJ SOAJ injection Inject 0.3 mg into the muscle as needed for anaphylaxis. 1 each 0   ezetimibe (ZETIA) 10 MG tablet TAKE 1 TABLET(10 MG) BY MOUTH DAILY 90 tablet 0   fluticasone (FLOVENT HFA) 110 MCG/ACT inhaler Inhale 1 puff into the lungs 2 (two) times daily. 1 Inhaler 11   gabapentin (NEURONTIN) 600 MG tablet Take 1 tablet (600 mg total) by mouth 3 (three) times daily. 270 tablet 0    glipiZIDE (GLUCOTROL) 10 MG tablet TAKE 1 TABLET(10 MG) BY MOUTH TWICE DAILY BEFORE A MEAL 180 tablet 0   ibuprofen (ADVIL) 200 MG tablet Take 200 mg by mouth every 6 (six) hours as needed.     insulin degludec (TRESIBA FLEXTOUCH) 100 UNIT/ML FlexTouch Pen Inject 44 Units into the skin at bedtime. 15 mL 2   Insulin Pen Needle (BD PEN NEEDLE NANO U/F) 32G X 4 MM MISC 1 each by Does not apply route daily. 100 each 2   INVEGA SUSTENNA 234 MG/1.5ML  injection ADMINISTER 1.5 ML IN THE MUSCLE EVERY 28 DAYS 1.5 mL 11   MELATONIN PO Take 5 mg by mouth at bedtime as needed.     metFORMIN (GLUCOPHAGE) 500 MG tablet TAKE 2 TABLETS(1000 MG) BY MOUTH TWICE DAILY WITH A MEAL 360 tablet 0   OLANZapine (ZYPREXA) 7.5 MG tablet Take 1 tablet (7.5 mg total) by mouth at bedtime. 90 tablet 0   omeprazole (PRILOSEC) 10 MG capsule TAKE 1 CAPSULE BY MOUTH DAILY 90 capsule 0   predniSONE (DELTASONE) 20 MG tablet Take 2 tablets daily (40mg ) for 7 days, then take 1 tab daily (20mg ) for 7 days. (Patient not taking: Reported on 09/18/2022) 21 tablet 0   Vilazodone HCl (VIIBRYD) 40 MG TABS Take 1 tablet (40 mg total) by mouth daily. 90 tablet 1   Current Facility-Administered Medications  Medication Dose Route Frequency Provider Last Rate Last Admin   paliperidone (INVEGA SUSTENNA) injection 234 mg  234 mg Intramuscular Q28 days [name], MD   234 mg at 09/08/22 1105     Musculoskeletal: Strength & Muscle Tone:  normal Gait & Station: normal Patient leans: N/A  Psychiatric Specialty Exam: Review of Systems  There were no vitals taken for this visit.There is no height or weight on file to calculate BMI.  General Appearance: {Appearance:22683}  Eye Contact:  {BHH EYE CONTACT:22684}  Speech:  Clear and Coherent  Volume:  Normal  Mood:  {BHH MOOD:22306}  Affect:  {Affect (PAA):22687}  Thought Process:  Coherent  Orientation:  Full (Time, Place, and Person)  Thought Content: Logical   Suicidal Thoughts:  {ST/HT  (PAA):22692}  Homicidal Thoughts:  {ST/HT (PAA):22692}  Memory:  Immediate;   Good  Judgement:  {Judgement (PAA):22694}  Insight:  {Insight (PAA):22695}  Psychomotor Activity:  Normal  Concentration:  Concentration: Good and Attention Span: Good  Recall:  Good  Fund of Knowledge: Good  Language: Good  Akathisia:  No  Handed:  Right  AIMS (if indicated): not done  Assets:  Communication Skills Desire for Improvement  ADL's:  Intact  Cognition: WNL  Sleep:  {BHH GOOD/FAIR/POOR:22877}   Screenings: Geneticist, molecular Office Visit from 05/12/2017 in [facility] Office Visit from 07/29/2016 in [facility] Office Visit from 05/06/2016 in [facility] Office Visit from 04/01/2016 in [facility] Office Visit from 03/28/2015 in [facility]  AIMS Total Score 0 0 0 0 0      GAD-7    Flowsheet Row Office Visit from 06/23/2022 in [facility] Office Visit from 06/16/2022 in [facility] Office Visit from 05/07/2022 in [facility] Office Visit from 03[facility]23/2022 in [facility] Office Visit from 03/13/2022 in [facility]  Total GAD-7 Score 5 6 19 8 7       Mini-Mental    Flowsheet Row Office Visit from 09/07/2019 in [facility]  Total Score (max 30 points ) 16      PHQ2-9    Flowsheet Row Clinical Support from 09/18/2022 in [facility] Office Visit from 06/23/2022 in [facility] Office Visit from 06/16/2022 in [facility] Office Visit from 05/07/2022 in [facility] Nutrition from  03/31/2022 in [facility] Nutrition &  Diabetes Education Services at [facility] Total Score 6 2 2 2 3   PHQ-9 Total Score 9 9 9 17 12       Flowsheet Row Office Visit from 06/16/2022 in [facility] Office Visit from 05/07/2022 in [facility] Office Visit from 01/22/2022 in [facility]  C-SSRS RISK CATEGORY No Risk No Risk No Risk        Assessment and Plan:  Tabitha [name] is a 52 y.o. year old female with a history of schizoaffective disorder,  sleep apnea (using CPAP machine), diabetes, hypertension, hyperlipidemia, GERD, who presents for follow up appointment for below.    1. Schizoaffective disorder, depressive type (HCC) 2. Obsessive-compulsive disorder, unspecified type 3. Anxiety state Acute stressors include:  Other stressors include: her husband being registered as a sex offender    History: Tx from Dr. Evelene Croon. no psych admission. Originally on Invega 234 IM, perphenazine 8 mg daily, Viibryd 40 mg daily, amantadine 100 mg twice a day, gabapentin 600 mg TID    She continues to experience anxiety, and the paranoia since the last visit.  Will uptitrate Depakote to target anxiety and mood dysregulation given she reports good benefit in the past, although it is off-label use.  Discussed potential risk of weight gain, drowsiness.  Will consider uptitration of olanzapine if she has limited benefit from this.  Will continue Invega, olanzapine to target schizoaffective disorder. She benefit from using two antipsychotics, considering she had treatment-resistant hallucinations while on Invega injection.  Will continue Viibryd to target depression, and gabapentin for anxiety.   # prolactin  The level has been trending down.  She denies symptoms of galactorrhea.  Will continue to monitor this.    Plan Continue monthly Invega 234 mg IM - monitor weight gain. She is  on metformin.  Continue olanzapine 7.5 mg at night - monitor weight gain Increase Depakote 750 mg daily  [facility] (CMP, LFT, [facility], five days after increasing Depakote Continue Viibryd 40 mg daily Continue gabapentin 600 mg 3 times a day Obtain EKG - please call [phone] to make an appointment  Next appointment- 6/11 8 am, video - She sees [facility] PCP at [facility] - on metformin   Past trials of medication: Abilify (hives), olanzapine, quetiapine, risperidone (stopped working), perphenazine (throat swelling per patient)        Collaboration of Care: Collaboration of Care: {BH OP Collaboration of Care:21014065}  Patient/Guardian was advised Release of Information must be obtained prior to any record release in order to collaborate their care with an outside provider. Patient/Guardian was advised if they have not already done so to contact the registration department to sign all necessary forms in order for [place] to release information regarding their care.   Consent: Patient/Guardian gives verbal consent for treatment and assignment of benefits for services provided during this visit. Patient/Guardian expressed understanding and agreed to proceed.    [name], MD 09/26/2022, 5:47 PM

## 2022-09-30 ENCOUNTER — Telehealth: Payer: Self-pay | Admitting: Psychiatry

## 2022-09-30 NOTE — Telephone Encounter (Signed)
called and pharmcy states that rx will be ready for pick up and it is a zero copay.

## 2022-09-30 NOTE — Telephone Encounter (Signed)
Tabitha [name] responded that was the change. pt states she can not afford that and wants to be put on something else.

## 2022-09-30 NOTE — Telephone Encounter (Signed)
called patient she states that it is not the injection cost that she concerned about it is the injection fee that her insurance does not pay for it and it is $135 and they can not afford that. she wants to go on a pill form if possible.   I sent a message to shawn, rosalynn and sylvia to look into this cost before making any changes to medications.  It maybe a processing error. - pending

## 2022-09-30 NOTE — Telephone Encounter (Signed)
Could you find out what might be happening? She has been on the Invega injection for a while without any issues. I wonder if there was any change in her insurance coverage. If Tabitha [name] is no longer covered, please find out which alternative injections for schizoaffective disorder are covered by her insurance.

## 2022-09-30 NOTE — Telephone Encounter (Unsigned)
Patient called and said her injection is not covered by her insurance and wants to know if there is an alternative she can have-please advise

## 2022-09-30 NOTE — Telephone Encounter (Signed)
Thank you for looking into this. I see that she received an injection on 5/14 and has an appointment in a few days. Please advise her to stay on her current oral medication. We will plan to discuss pill form options at her next visit.

## 2022-10-01 NOTE — Telephone Encounter (Signed)
left message that per dr. Vanetta Shawl she will speak with you at your next appt.

## 2022-10-06 ENCOUNTER — Telehealth: Payer: Medicare Other | Admitting: Psychiatry

## 2022-10-06 ENCOUNTER — Ambulatory Visit: Payer: Medicare Other

## 2022-10-11 NOTE — Progress Notes (Unsigned)
Virtual Visit via Video Note  I connected with Tabitha [name] on 10/15/22 at  2:30 PM EDT by a video enabled telemedicine application and verified that I am speaking with the correct person using two identifiers.  Location: Patient: home Provider: office Persons participated in the visit- patient, provider    I discussed the limitations of evaluation and management by telemedicine and the availability of in person appointments. The patient expressed understanding and agreed to proceed.     I discussed the assessment and treatment plan with the patient. The patient was provided an opportunity to ask questions and all were answered. The patient agreed with the plan and demonstrated an understanding of the instructions.   The patient was advised to call back or seek an in-person evaluation if the symptoms worsen or if the condition fails to improve as anticipated.  I provided 15 minutes of non-face-to-face time during this encounter.   [name], MD    [facility] MD/PA/NP OP Progress Note  10/15/2022 3:06 PM Tabitha [name]  MRN:  [id number]  Chief Complaint:  Chief Complaint  Patient presents with   Follow-up   HPI:  This is a follow-up appointment for schizoaffective disorder and anxiety.  She states that she is not doing well.  She has been feeling more paranoid and anxious.  She could not go to choir as she felt that people there were trying to get her.  She feels the same today.  She believes in these thoughts.  Her family has been doing well and she reports good relationship with them.  Although the Depakote has helped some, she thinks she has been getting worse as she is not getting injection anymore.  She sleeps up to 7 hours.  She feels depressed.  She denies SI, HI.  She has chronic AH of hearing her family's voice.  She denies VH.  She denies alcohol use or drug use.  She will not be able to receive any injection due to its cost. Although she reports concern of the cost  associated with EKG, she has agreed to proceed this time. She feels comfortable with the plan as below.    245 lbs- same since 04/29 Wt Readings from Last 3 Encounters:  09/18/22 232 lb (105.2 kg)  09/08/22 243 lb 3.2 oz (110.3 kg)  08/11/22 236 lb 9.6 oz (107.3 kg)       Visit Diagnosis:    ICD-10-CM   1. Schizoaffective disorder, depressive type (HCC)  F25.1     2. Obsessive-compulsive disorder, unspecified type  F42.9     3. Anxiety state  F41.1       Past Psychiatric History: Please see initial evaluation for full details. I have reviewed the history. No updates at this time.     Past Medical History:  Past Medical History:  Diagnosis Date   Allergic rhinitis    Anxiety    Asthma    Bipolar disorder (HCC)    Depression    Diabetes mellitus without complication (HCC)    GERD (gastroesophageal reflux disease)    Hyperlipidemia    Hypertension    Mild sleep apnea    OCD (obsessive compulsive disorder)    Palpitations    Paranoia (HCC)    Schizoaffective disorder (HCC)    Sleep apnea     Past Surgical History:  Procedure Laterality Date   CESAREAN SECTION  2006/2008   CHOLECYSTECTOMY  2006    Family Psychiatric History: Please see initial evaluation for full details.  I have reviewed the history. No updates at this time.     Family History:  Family History  Problem Relation Age of Onset   Lupus Mother    Diabetes Mother        pre diabetes   Diabetes Father    Hyperlipidemia Father    Diabetes Brother    Breast cancer Maternal Grandmother    Depression Paternal Grandmother    Bipolar disorder Paternal Grandmother    Cancer - Colon Neg Hx     Social History:  Social History   Socioeconomic History   Marital status: Married    Spouse name: Analyse Fruehauf   Number of children: 2   Years of education: 16   Highest education level: Bachelor's degree (e.g., BA, AB, BS)  Occupational History   Occupation: disability  Tobacco Use   Smoking status:  Never   Smokeless tobacco: Never  Vaping Use   Vaping Use: Never used  Substance and Sexual Activity   Alcohol use: Not Currently   Drug use: Never   Sexual activity: Not Currently    Partners: Male    Birth control/protection: Surgical  Other Topics Concern   Not on file  Social History Narrative   Not on file   Social Determinants of Health   Financial Resource Strain: Medium Risk (09/18/2022)   Overall Financial Resource Strain (CARDIA)    Difficulty of Paying Living Expenses: Somewhat hard  Food Insecurity: Food Insecurity Present (09/18/2022)   Hunger Vital Sign    Worried About Running Out of Food in the Last Year: Sometimes true    Ran Out of Food in the Last Year: Sometimes true  Transportation Needs: No Transportation Needs (09/18/2022)   PRAPARE - Administrator, Civil Service (Medical): No    Lack of Transportation (Non-Medical): No  Physical Activity: Inactive (09/18/2022)   Exercise Vital Sign    Days of Exercise per Week: 0 days    Minutes of Exercise per Session: 0 min  Stress: No Stress Concern Present (09/18/2022)   [employer] of Occupational Health - Occupational Stress Questionnaire    Feeling of Stress : Only a little  Social Connections: Moderately Integrated (09/18/2022)   Social Connection and Isolation Panel [NHANES]    Frequency of Communication with Friends and Family: More than three times a week    Frequency of Social Gatherings with Friends and Family: Not on file    Attends Religious Services: Never    Database administrator or Organizations: Yes    Attends Banker Meetings: More than 4 times per year    Marital Status: Married    Allergies:  Allergies  Allergen Reactions   Abilify [Aripiprazole] Hives    Metabolic Disorder Labs: Lab Results  Component Value Date   HGBA1C 8.8 (H) 06/23/2022   MPG 206 06/23/2022   MPG 171 05/21/2021   Lab Results  Component Value Date   PROLACTIN 37.8 (H) 01/22/2022    PROLACTIN 84.8 (H) 06/01/2017   Lab Results  Component Value Date   CHOL 149 06/23/2022   TRIG 449 (H) 06/23/2022   HDL 51 06/23/2022   CHOLHDL 2.9 06/23/2022   VLDL 51 (H) 04/23/2017   LDLCALC  06/23/2022     Comment:     . LDL cholesterol not calculated. Triglyceride levels greater than 400 mg/dL invalidate calculated LDL results. . Reference range: <100 . Desirable range <100 mg/dL for primary prevention;   <70 mg/dL for patients with CHD or  diabetic patients  with > or = 2 CHD risk factors. Marland Kitchen LDL-C is now calculated using the Martin-Hopkins  calculation, which is a validated novel method providing  better accuracy than the Friedewald equation in the  estimation of LDL-C.  Horald Pollen et al. Lenox Ahr. 1610;960(45): 2061-2068  (http://education.QuestDiagnostics.com/faq/FAQ164)    LDLCALC 69 03/23/2022   Lab Results  Component Value Date   TSH 1.026 01/22/2022   TSH 1.56 12/17/2021    Therapeutic Level Labs: No results found for: "LITHIUM" Lab Results  Component Value Date   VALPROATE 13 (L) 06/16/2022   VALPROATE 46 (L) 01/22/2022   No results found for: "CBMZ"  Current Medications: Current Outpatient Medications  Medication Sig Dispense Refill   ACCU-CHEK GUIDE test strip USE TO TEST TWICE DAILY, AS DIRECTED 100 strip 2   albuterol (VENTOLIN HFA) 108 (90 Base) MCG/ACT inhaler INHALE 2 PUFFS INTO THE LUNGS EVERY 6 HOURS AS NEEDED FOR WHEEZING OR SHORTNESS OF BREATH 25.5 g 3   atorvastatin (LIPITOR) 40 MG tablet Take 1 tablet (40 mg total) by mouth daily. 90 tablet 1   divalproex (DEPAKOTE ER) 250 MG 24 hr tablet Take 1 tablet (250 mg total) by mouth daily. Total of 750 mg at night. Take along with 500 mg tab 30 tablet 1   divalproex (DEPAKOTE ER) 500 MG 24 hr tablet Take 1 tablet (500 mg total) by mouth daily. 90 tablet 0   EPINEPHrine 0.3 mg/0.3 mL IJ SOAJ injection Inject 0.3 mg into the muscle as needed for anaphylaxis. 1 each 0   ezetimibe (ZETIA) 10 MG tablet  TAKE 1 TABLET(10 MG) BY MOUTH DAILY 90 tablet 0   fluticasone (FLOVENT HFA) 110 MCG/ACT inhaler Inhale 1 puff into the lungs 2 (two) times daily. 1 Inhaler 11   gabapentin (NEURONTIN) 600 MG tablet Take 1 tablet (600 mg total) by mouth 3 (three) times daily. 270 tablet 0   glipiZIDE (GLUCOTROL) 10 MG tablet TAKE 1 TABLET(10 MG) BY MOUTH TWICE DAILY BEFORE A MEAL 180 tablet 0   ibuprofen (ADVIL) 200 MG tablet Take 200 mg by mouth every 6 (six) hours as needed.     insulin degludec (TRESIBA FLEXTOUCH) 100 UNIT/ML FlexTouch Pen Inject 44 Units into the skin at bedtime. 15 mL 2   Insulin Pen Needle (BD PEN NEEDLE NANO U/F) 32G X 4 MM MISC 1 each by Does not apply route daily. 100 each 2   INVEGA SUSTENNA 234 MG/1.5ML injection ADMINISTER 1.5 ML IN THE MUSCLE EVERY 28 DAYS 1.5 mL 11   MELATONIN PO Take 5 mg by mouth at bedtime as needed.     metFORMIN (GLUCOPHAGE) 500 MG tablet TAKE 2 TABLETS(1000 MG) BY MOUTH TWICE DAILY WITH A MEAL 360 tablet 0   OLANZapine (ZYPREXA) 7.5 MG tablet Take 1 tablet (7.5 mg total) by mouth at bedtime. 90 tablet 0   omeprazole (PRILOSEC) 10 MG capsule TAKE 1 CAPSULE BY MOUTH DAILY 90 capsule 0   predniSONE (DELTASONE) 20 MG tablet Take 2 tablets daily (40mg ) for 7 days, then take 1 tab daily (20mg ) for 7 days. (Patient not taking: Reported on 09/18/2022) 21 tablet 0   Vilazodone HCl (VIIBRYD) 40 MG TABS Take 1 tablet (40 mg total) by mouth daily. 90 tablet 1   Current Facility-Administered Medications  Medication Dose Route Frequency Provider Last Rate Last Admin   paliperidone (INVEGA SUSTENNA) injection 234 mg  234 mg Intramuscular Q28 days [name], MD   234 mg at 09/08/22 1105  Musculoskeletal: Strength & Muscle Tone:  N/A Gait & Station:  N/A Patient leans: N/A  Psychiatric Specialty Exam: Review of Systems  Psychiatric/Behavioral:  Positive for dysphoric mood, hallucinations and sleep disturbance. Negative for agitation, behavioral problems,  confusion, decreased concentration, self-injury and suicidal ideas. The patient is nervous/anxious. The patient is not hyperactive.   All other systems reviewed and are negative.   There were no vitals taken for this visit.There is no height or weight on file to calculate BMI.  General Appearance: Fairly Groomed  Eye Contact:  Good  Speech:  Clear and Coherent  Volume:  Normal  Mood:  Anxious  Affect:  Appropriate, Congruent, and down  Thought Process:  Coherent  Orientation:  Full (Time, Place, and Person)  Thought Content: Logical   Suicidal Thoughts:  No  Homicidal Thoughts:  No  Memory:  Immediate;   Good  Judgement:  Good  Insight:  Good  Psychomotor Activity:  Normal  Concentration:  Concentration: Good and Attention Span: Good  Recall:  Good  Fund of Knowledge: Good  Language: Good  Akathisia:  No  Handed:  Right  AIMS (if indicated): not done  Assets:  Communication Skills Desire for Improvement  ADL's:  Intact  Cognition: WNL  Sleep:  Fair   Screenings: Midwife Visit from 05/12/2017 in [facility] Office Visit from 07/29/2016 in [facility] Office Visit from 05/06/2016 in [facility] Office Visit from 04/01/2016 in [facility] Office Visit from 03/28/2015 in [facility]  AIMS Total Score 0 0 0 0 0      GAD-7    Flowsheet Row Office Visit from 06/23/2022 in [facility] Office Visit from 06/16/2022 in [facility] Office Visit from 05/07/2022 in [facility] Office Visit from 03/23/2022 in [facility] Office Visit from 03/13/2022 in [facility]  Total GAD-7 Score 5 6 19 8 7        Mini-Mental    Flowsheet Row Office Visit from 09/07/2019 in [facility]  Total Score (max 30 points ) 16      PHQ2-9    Flowsheet Row Clinical Support from 09/18/2022 in [facility] Office Visit from 06/23/2022 in [facility] Office Visit from 06/16/2022 in [facility] Office Visit from 05/07/2022 in [facility] Nutrition from 03/31/2022 in [facility] Total Score 6 2 2 2 3   PHQ-9 Total Score 9 9 9 17 12       Flowsheet Row Office Visit from 06/16/2022 in [facility] Office Visit from 05/07/2022 in [facility] Office Visit from 01/22/2022 in [facility]  C-SSRS RISK CATEGORY No Risk No Risk No Risk        Assessment and Plan:  SKYLEEN WATES is a 52 y.o. year old female with a history of schizoaffective disorder,  sleep apnea (using CPAP machine), diabetes, hypertension, hyperlipidemia, GERD, who presents for follow up appointment for below.   1. Schizoaffective disorder, depressive type (HCC) 2. Obsessive-compulsive disorder, unspecified type 3. Anxiety state Acute stressors include:  Other stressors include:  her husband being registered as a sex offender    History: no psych admission     There has been significant worsening in anxiety, paranoia since she received her last injection, although she reports some benefit from uptitration of Depakote.  Will uptitrate olanzapine to optimize treatment for schizoaffective disorder.  Discussed potential metabolic side effect, EPS and QTc prolongation.  Unfortunately she will not be able to afford injection moving forward.  Will continue Depakote at the current dose for mood dysregulation/off label  given she reports good benefit.  Will continue Viibryd to target depression and gabapentin for anxiety.    # prolactin  The level has been trending down.  She denies symptoms of galactorrhea.  Will continue to monitor this.    Plan Continue monthly Invega 234 mg IM - monitor weight gain. She is on metformin.  Increase olanzapine 1.5 tab of 7.5 tab for 3 days, then 15 mg at night  Obtain EKG - please call [phone] to make an appointment  Continue Depakote 750 mg daily  Obtain labs (CMP, LFT, VPA) at [facility] Continue Viibryd 40 mg daily Continue gabapentin 600 mg 3 times a day Next appointment-8/8 at 8 30 for 30 mins, video/wait list and 9/9 at 11 am for 30 mins, IP - She [facility] - on metformin   Past trials of medication: Abilify (hives), olanzapine, quetiapine, risperidone (stopped working),       Secretary/administrator of Care: Collaboration of Care: Other reviewed notes in Epic  Patient/Guardian was advised Release of Information must be obtained prior to any record release in order to collaborate their care with an outside provider. Patient/Guardian was advised if they have not already done so to contact the registration department to sign all necessary forms in order for [place] to release information regarding their care.   Consent: Patient/Guardian gives verbal consent for treatment and assignment of benefits for services provided during this visit. Patient/Guardian expressed understanding and agreed to proceed.    [name], MD 10/15/2022, 3:06 PM

## 2022-10-15 ENCOUNTER — Telehealth (INDEPENDENT_AMBULATORY_CARE_PROVIDER_SITE_OTHER): Payer: Medicare Other | Admitting: Psychiatry

## 2022-10-15 ENCOUNTER — Encounter: Payer: Self-pay | Admitting: Psychiatry

## 2022-10-15 DIAGNOSIS — F429 Obsessive-compulsive disorder, unspecified: Secondary | ICD-10-CM

## 2022-10-15 DIAGNOSIS — F411 Generalized anxiety disorder: Secondary | ICD-10-CM | POA: Diagnosis not present

## 2022-10-15 DIAGNOSIS — F251 Schizoaffective disorder, depressive type: Secondary | ICD-10-CM | POA: Diagnosis not present

## 2022-10-19 ENCOUNTER — Telehealth: Payer: Self-pay | Admitting: Pharmacist

## 2022-10-19 ENCOUNTER — Other Ambulatory Visit: Payer: Self-pay | Admitting: Pharmacist

## 2022-10-19 DIAGNOSIS — K219 Gastro-esophageal reflux disease without esophagitis: Secondary | ICD-10-CM

## 2022-10-19 NOTE — Progress Notes (Signed)
   10/19/2022  Patient ID: [name], female   DOB: [birth year], 52 y.o.   MRN: [id number]  Pharmacy Quality Measure Review  Receive message from [facility] CPhT Noreene Larsson Simcox related to the adherence measure for cholesterol (statin) medications this calendar year.   Medication: atorvastatin 40 mg  Last fill date: 07/06/2022 for 90 day supply  Per message from [facility] CPhT, patient reported that she did not currently have a supply of atorvastatin 40 mg, rather was doubling up on a previous supply of atorvastatin 20 mg - taking 2 tablets (40 mg) daily.  From review of chart, note when PCP sent prescription for atorvastatin 40 mg to pharmacy on 3/11, sent for 90 day supply + 1 refill  Outreach to [employer] today - confirm refill available for patient and pharmacy will refill this for patient today. Pharmacy confirms previous atorvastatin prescriptions for other strengths of atorvastatin have been deactivated.  Outreach to patient by telephone today to provide this update. Patient states that she will pick up her atorvastatin 40 mg prescription refill from pharmacy.   Advise patient to contact office to schedule 3 month follow up appointment with PCP  Patient requests renewal of her omeprazole prescription - will send request to clinical team  Estelle Grumbles, PharmD, [facility] Clinical Pharmacist [facility] [phone]

## 2022-10-19 NOTE — Telephone Encounter (Signed)
Patient requesting renewal of her omeprazole prescription. Would you please send this to [facility] for patient?  Thank you!  Gentry Fitz

## 2022-10-19 NOTE — Progress Notes (Signed)
   10/19/2022  Patient ID: Tabitha [name], female   DOB: [birth year], 52 y.o.   MRN: [id number]  Pharmacy Quality Measure Review  Receive message from [facility] CPhT Noreene Larsson Simcox related to the adherence measure for cholesterol (statin) medications this calendar year.   Medication: atorvastatin 40 mg  Last fill date: 07/06/2022 for 90 day supply  Was unable to reach patient via telephone today and have left HIPAA compliant voicemail asking patient to return my call.  Estelle Grumbles, PharmD, [facility] Clinical Pharmacist [facility] [phone]

## 2022-10-20 ENCOUNTER — Telehealth: Payer: Self-pay

## 2022-10-20 MED ORDER — OMEPRAZOLE 10 MG PO CPDR
10.0000 mg | DELAYED_RELEASE_CAPSULE | Freq: Every day | ORAL | 1 refills | Status: DC
Start: 2022-10-20 — End: 2023-04-14

## 2022-10-20 NOTE — Telephone Encounter (Signed)
RX sent to pt's pharmacy.   Thanks,   -Daleyssa Loiselle  

## 2022-10-20 NOTE — Telephone Encounter (Signed)
pt called states that she has been taking 1 and 1/2 of the olanzapine and now she has hives. she didn't know if she should stop taking or decrease to 1.    If you decrease she will need a new rx.  Pt last seen on 6-20 next appt 8-8

## 2022-10-21 ENCOUNTER — Telehealth: Payer: Self-pay | Admitting: Psychiatry

## 2022-10-21 DIAGNOSIS — F251 Schizoaffective disorder, depressive type: Secondary | ICD-10-CM

## 2022-10-21 NOTE — Telephone Encounter (Signed)
Talked with the patient. She states that she has some hives but denies shortness of breath and diarrhea. She believes the hives started when she began taking olanzapine 7.5 mg, although she did not have any issues when she was on 5 mg. She thinks her auditory hallucinations have improved, although she still experiences them. She denies suicidal ideation. She agrees with the following plan:  Reduce olanzapine to 5 mg at night. Obtain EKG and labs as discussed at the previous visit. She agrees to try Geodon if the EKG is within normal limits. She agrees to go to the emergency room if there is any worsening of possible allergic reactions or if she experiences any shortness of breath or diarrhea.

## 2022-10-21 NOTE — Telephone Encounter (Signed)
Called to advise patient of message no answer left voicemail for patient to return call to office

## 2022-10-21 NOTE — Telephone Encounter (Signed)
Ordered EKG

## 2022-10-21 NOTE — Telephone Encounter (Signed)
Patient called inquiring about a new medication she was prescribed to by [place]. She is not sure what the name of the medication is. Patient would like to know more about medication.Patient's next appointment is August 8-Please advise

## 2022-10-21 NOTE — Telephone Encounter (Signed)
Could you contact the patient? The medication I discussed this morning is Geodon, to replace olanzapine after she obtains an EKG. It is an antipsychotic and is meant to help with hallucinations and mood symptoms. Let me know if she has any more questions.

## 2022-10-23 ENCOUNTER — Other Ambulatory Visit: Payer: Self-pay | Admitting: Internal Medicine

## 2022-10-23 ENCOUNTER — Telehealth: Payer: Self-pay

## 2022-10-23 DIAGNOSIS — Z794 Long term (current) use of insulin: Secondary | ICD-10-CM

## 2022-10-23 NOTE — Telephone Encounter (Signed)
Patient called there office to report that she did get an appointment for an EKG on 10/27/22 @ 10:00 am

## 2022-10-26 NOTE — Telephone Encounter (Signed)
Requested Prescriptions  Pending Prescriptions Disp Refills   glipiZIDE (GLUCOTROL) 10 MG tablet [Pharmacy Med Name: GLIPIZIDE 10MG  TABLETS] 180 tablet 0    Sig: TAKE 1 TABLET(10 MG) BY MOUTH TWICE DAILY BEFORE A MEAL     Endocrinology:  Diabetes - Sulfonylureas Failed - 10/23/2022  9:50 AM      Failed - HBA1C is between 0 and 7.9 and within 180 days    HB A1C (BAYER DCA - WAIVED)  Date Value Ref Range Status  10/22/2017 6.9 <7.0 % Final    Comment:                                          Diabetic Adult            <7.0                                       Healthy Adult        4.3 - 5.7                                                           (DCCT/NGSP) American Diabetes Association's Summary of Glycemic Recommendations for Adults with Diabetes: Hemoglobin A1c <7.0%. More stringent glycemic goals (A1c <6.0%) may further reduce complications at the cost of increased risk of hypoglycemia.    Hgb A1c MFr Bld  Date Value Ref Range Status  06/23/2022 8.8 (H) <5.7 % of total Hgb Final    Comment:    For someone without known diabetes, a hemoglobin A1c value of 6.5% or greater indicates that they may have  diabetes and this should be confirmed with a follow-up  test. . For someone with known diabetes, a value <7% indicates  that their diabetes is well controlled and a value  greater than or equal to 7% indicates suboptimal  control. A1c targets should be individualized based on  duration of diabetes, age, comorbid conditions, and  other considerations. . Currently, no consensus exists regarding use of hemoglobin A1c for diagnosis of diabetes for children. .          Passed - Cr in normal range and within 360 days    Creat  Date Value Ref Range Status  06/23/2022 0.69 0.50 - 1.03 mg/dL Final   Creatinine, Urine  Date Value Ref Range Status  03/23/2022 26 20 - 275 mg/dL Final         Passed - Valid encounter within last 6 months    Recent Outpatient Visits            2 months ago Dermatitis due to plants, including poison ivy, sumac, and oak   [place] [facility], Erin E, PA-C   3 months ago Type 2 diabetes mellitus with diabetic polyneuropathy, with long-term current use of insulin [facility])   [place] [facility], Salvadore [place], NP   4 months ago Encounter for general adult medical examination with abnormal findings   [place] [facility] [place], [place] W, NP   7 months ago Type 2 diabetes mellitus with diabetic polyneuropathy, with  long-term current use of insulin [facility])   [place] [facility], Salvadore Oxford, NP   7 months ago Viral URI with cough   [place] [facility] [place], Salvadore Oxford, [place]

## 2022-10-27 ENCOUNTER — Ambulatory Visit
Admission: RE | Admit: 2022-10-27 | Discharge: 2022-10-27 | Disposition: A | Discharge status: Home / Self Care | Payer: Medicare Other | Source: Ambulatory Visit | Attending: Psychiatry | Admitting: Psychiatry

## 2022-10-27 ENCOUNTER — Telehealth: Payer: Self-pay | Admitting: Psychiatry

## 2022-10-27 DIAGNOSIS — F251 Schizoaffective disorder, depressive type: Secondary | ICD-10-CM | POA: Diagnosis not present

## 2022-10-27 DIAGNOSIS — Z136 Encounter for screening for cardiovascular disorders: Secondary | ICD-10-CM | POA: Diagnosis not present

## 2022-10-27 MED ORDER — ZIPRASIDONE HCL 20 MG PO CAPS: 20.0000 mg | ORAL_CAPSULE | Freq: Two times a day (BID) | ORAL | 1 refills | Status: DC

## 2022-10-27 NOTE — Telephone Encounter (Signed)
Called patient to make aware of message she voiced understanding

## 2022-10-27 NOTE — Telephone Encounter (Signed)
Reviewed EKG. QTc 441 msec, HR 76, 10/2022  Please call the patient and advise the following: Start ziprasidone 20 mg twice a day with meals. Reduce olanzapine to 2.5 mg at night for one week, then discontinue.

## 2022-11-09 ENCOUNTER — Other Ambulatory Visit: Payer: Self-pay | Admitting: Psychiatry

## 2022-11-09 ENCOUNTER — Telehealth: Payer: Self-pay

## 2022-11-09 DIAGNOSIS — F251 Schizoaffective disorder, depressive type: Secondary | ICD-10-CM

## 2022-11-09 MED ORDER — DIVALPROEX SODIUM ER 250 MG PO TB24: 250.0000 mg | ORAL_TABLET | Freq: Every day | ORAL | 0 refills | Status: DC

## 2022-11-09 MED ORDER — DIVALPROEX SODIUM ER 500 MG PO TB24: 500.0000 mg | ORAL_TABLET | Freq: Every day | ORAL | 0 refills | Status: DC

## 2022-11-09 NOTE — Telephone Encounter (Signed)
received fax requesting a refill on the divalproes 500mg  and 250mg  and also for the iibryd 40mg . pt was last seen on 6-20 next appt 8-8-

## 2022-11-09 NOTE — Telephone Encounter (Signed)
pt was notified that rx was sent to the pharmacy

## 2022-11-09 NOTE — Telephone Encounter (Signed)
I have sent Depakote 250 mg and 500 mg 2 pharmacy at [facility].  Viibryd-she still has supplies left and I will defer to Dr. Vanetta Shawl to refill this once she is back in office.

## 2022-11-10 ENCOUNTER — Other Ambulatory Visit: Payer: Self-pay | Admitting: Internal Medicine

## 2022-11-10 ENCOUNTER — Telehealth: Payer: Self-pay | Admitting: Internal Medicine

## 2022-11-10 NOTE — Telephone Encounter (Signed)
Already requested by pharmacy on 11/10/22 in refill encounter, pending approval.

## 2022-11-10 NOTE — Telephone Encounter (Signed)
Medication Refill - Medication: insulin degludec (TRESIBA FLEXTOUCH) 100 UNIT/ML FlexTouch Pen   Has the patient contacted their pharmacy? No. No, more refills.  (Agent: If no, request that the patient contact the pharmacy for the refill. If patient does not wish to contact the pharmacy document the reason why and proceed with request.)   Preferred Pharmacy (with phone number or street name):  [id number] [place], [place] - [address] AT [facility] OF [address] [place]  [address] [place] [address]  Phone: [phone] Fax: [phone]  Hours: Not open 24 hours   Has the patient been seen for an appointment in the last year OR does the patient have an upcoming appointment? Yes.    Agent: Please be advised that RX refills may take up to 3 business days. We ask that you follow-up with your pharmacy.

## 2022-11-11 NOTE — Telephone Encounter (Signed)
Requested Prescriptions  Pending Prescriptions Disp Refills   TRESIBA FLEXTOUCH 100 UNIT/ML FlexTouch Pen [Pharmacy Med Name: TRESIBA FLEXTOUCH PEN ([id number]] 15 mL 2    Sig: ADMINISTER 40 UNITS UNDER THE SKIN EVERY DAY     Endocrinology:  Diabetes - Insulins Failed - 11/10/2022  9:27 AM      Failed - HBA1C is between 0 and 7.9 and within 180 days    HB A1C (BAYER DCA - WAIVED)  Date Value Ref Range Status  10/22/2017 6.9 <7.0 % Final    Comment:                                          Diabetic Adult            <7.0                                       Healthy Adult        4.3 - 5.7                                                           (DCCT/NGSP) American Diabetes Association's Summary of Glycemic Recommendations for Adults with Diabetes: Hemoglobin A1c <7.0%. More stringent glycemic goals (A1c <6.0%) may further reduce complications at the cost of increased risk of hypoglycemia.    Hgb A1c MFr Bld  Date Value Ref Range Status  06/23/2022 8.8 (H) <5.7 % of total Hgb Final    Comment:    For someone without known diabetes, a hemoglobin A1c value of 6.5% or greater indicates that they may have  diabetes and this should be confirmed with a follow-up  test. . For someone with known diabetes, a value <7% indicates  that their diabetes is well controlled and a value  greater than or equal to 7% indicates suboptimal  control. A1c targets should be individualized based on  duration of diabetes, age, comorbid conditions, and  other considerations. . Currently, no consensus exists regarding use of hemoglobin A1c for diagnosis of diabetes for children. Verna Czech - Valid encounter within last 6 months    Recent Outpatient Visits           3 months ago Dermatitis due to plants, including poison ivy, sumac, and oak   [place] [facility] M[place]um, Erin E, PA-C   4 months ago Type 2 diabetes mellitus with diabetic polyneuropathy, with long-term  current use of insulin [facility])   [place] [facility] [place], Salvadore [place], NP   4 months ago Encounter for general adult medical examination with abnormal findings   [place] [facility] [place], [place], NP   7 months ago Type 2 diabetes mellitus with diabetic polyneuropathy, with long-term current use of insulin [facility])   [place] [facility] [place], Salvadore Oxford, NP   8 months ago Viral URI with cough    [facility] [place], Salvadore Oxford, [place]

## 2022-11-15 ENCOUNTER — Other Ambulatory Visit: Payer: Self-pay | Admitting: Internal Medicine

## 2022-11-15 ENCOUNTER — Other Ambulatory Visit: Payer: Self-pay | Admitting: Psychiatry

## 2022-11-15 DIAGNOSIS — E119 Type 2 diabetes mellitus without complications: Secondary | ICD-10-CM

## 2022-11-15 DIAGNOSIS — F251 Schizoaffective disorder, depressive type: Secondary | ICD-10-CM

## 2022-11-17 ENCOUNTER — Other Ambulatory Visit: Payer: Self-pay | Admitting: Internal Medicine

## 2022-11-17 NOTE — Telephone Encounter (Signed)
Requested Prescriptions  Pending Prescriptions Disp Refills   metFORMIN (GLUCOPHAGE) 500 MG tablet [Pharmacy Med Name: METFORMIN 500MG  TABLETS] 360 tablet 0    Sig: TAKE 2 TABLETS(1000 MG) BY MOUTH TWICE DAILY WITH A MEAL     Endocrinology:  Diabetes - Biguanides Failed - 11/15/2022 12:43 PM      Failed - HBA1C is between 0 and 7.9 and within 180 days    HB A1C (BAYER DCA - WAIVED)  Date Value Ref Range Status  10/22/2017 6.9 <7.0 % Final    Comment:                                          Diabetic Adult            <7.0                                       Healthy Adult        4.3 - 5.7                                                           (DCCT/NGSP) American Diabetes Association's Summary of Glycemic Recommendations for Adults with Diabetes: Hemoglobin A1c <7.0%. More stringent glycemic goals (A1c <6.0%) may further reduce complications at the cost of increased risk of hypoglycemia.    Hgb A1c MFr Bld  Date Value Ref Range Status  06/23/2022 8.8 (H) <5.7 % of total Hgb Final    Comment:    For someone without known diabetes, a hemoglobin A1c value of 6.5% or greater indicates that they may have  diabetes and this should be confirmed with a follow-up  test. . For someone with known diabetes, a value <7% indicates  that their diabetes is well controlled and a value  greater than or equal to 7% indicates suboptimal  control. A1c targets should be individualized based on  duration of diabetes, age, comorbid conditions, and  other considerations. . Currently, no consensus exists regarding use of hemoglobin A1c for diagnosis of diabetes for children. .          Failed - B12 Level in normal range and within 720 days    Vitamin B-12  Date Value Ref Range Status  06/01/2017 578 232 - 1,245 pg/mL Final         Failed - CBC within normal limits and completed in the last 12 months    WBC  Date Value Ref Range Status  06/23/2022 7.5 3.8 - 10.8 Thousand/uL Final   RBC   Date Value Ref Range Status  06/23/2022 4.42 3.80 - 5.10 Million/uL Final   Hemoglobin  Date Value Ref Range Status  06/23/2022 13.0 11.7 - 15.5 g/dL Final  45/40/9811 91.4 11.1 - 15.9 g/dL Final   HCT  Date Value Ref Range Status  06/23/2022 38.8 35.0 - 45.0 % Final   Hematocrit  Date Value Ref Range Status  04/28/2018 39.6 34.0 - 46.6 % Final   MCHC  Date Value Ref Range Status  06/23/2022 33.5 32.0 - 36.0 g/dL Final   [facility]  Date Value Ref Range Status  06/23/2022 29.4  27.0 - 33.0 pg Final   MCV  Date Value Ref Range Status  06/23/2022 87.8 80.0 - 100.0 fL Final  04/28/2018 86 79 - 97 fL Final   No results found for: "PLTCOUNTKUC", "LABPLAT", "POCPLA" RDW  Date Value Ref Range Status  06/23/2022 14.5 11.0 - 15.0 % Final  04/28/2018 15.1 12.3 - 15.4 % Final    Comment:    **Effective May 02, 2018, the RDW pediatric reference**   interval will be removed and the adult reference interval   will be changing to:                             Female 11.7 - 15.4                                                      Female 11.6 - 15.4          Passed - Cr in normal range and within 360 days    Creat  Date Value Ref Range Status  06/23/2022 0.69 0.50 - 1.03 mg/dL Final   Creatinine, Urine  Date Value Ref Range Status  03/23/2022 26 20 - 275 mg/dL Final         Passed - eGFR in normal range and within 360 days    GFR calc Af Amer  Date Value Ref Range Status  09/07/2019 88 >59 mL/min/1.73 Final    Comment:    **[facility] currently reports eGFR in compliance with the current**   recommendations of the [employer]. [facility] will   update reporting as new guidelines are published from the NKF-ASN   Task force.    GFR calc non Af Amer  Date Value Ref Range Status  09/07/2019 76 >59 mL/min/1.73 Final   eGFR  Date Value Ref Range Status  06/23/2022 106 > OR = 60 mL/min/1.41m2 Final         Passed - Valid encounter within last 6 months    Recent  Outpatient Visits           3 months ago Dermatitis due to plants, including poison ivy, sumac, and oak   [place] [facility] Mecum, Erin E, PA-C   4 months ago Type 2 diabetes mellitus with diabetic polyneuropathy, with long-term current use of insulin [facility])   [place] [facility] [place], Salvadore [place], NP   4 months ago Encounter for general adult medical examination with abnormal findings   [place] [facility] [place], [place] W, NP   7 months ago Type 2 diabetes mellitus with diabetic polyneuropathy, with long-term current use of insulin [facility])   [place] [facility] [place], Salvadore Oxford, NP   8 months ago Viral URI with cough   [place] [facility] [place], Salvadore [place], [place]

## 2022-11-18 ENCOUNTER — Other Ambulatory Visit: Payer: Self-pay | Admitting: Psychiatry

## 2022-11-18 ENCOUNTER — Other Ambulatory Visit: Payer: Self-pay | Admitting: Internal Medicine

## 2022-11-18 DIAGNOSIS — F251 Schizoaffective disorder, depressive type: Secondary | ICD-10-CM

## 2022-11-18 DIAGNOSIS — Z794 Long term (current) use of insulin: Secondary | ICD-10-CM

## 2022-11-18 MED ORDER — GABAPENTIN 600 MG PO TABS
600.0000 mg | ORAL_TABLET | Freq: Three times a day (TID) | ORAL | 0 refills | Status: DC
Start: 2022-11-18 — End: 2023-02-14

## 2022-11-18 NOTE — Telephone Encounter (Signed)
Requested Prescriptions  Pending Prescriptions Disp Refills   ezetimibe (ZETIA) 10 MG tablet [Pharmacy Med Name: EZETIMIBE 10MG  TABLETS] 90 tablet 0    Sig: TAKE 1 TABLET(10 MG) BY MOUTH DAILY     Cardiovascular:  Antilipid - Sterol Transport Inhibitors Failed - 11/17/2022  7:03 AM      Failed - Lipid Panel in normal range within the last 12 months    Cholesterol, Total  Date Value Ref Range Status  09/07/2019 138 100 - 199 mg/dL Final   Cholesterol  Date Value Ref Range Status  06/23/2022 149 <200 mg/dL Final   Cholesterol Piccolo, Waived  Date Value Ref Range Status  04/23/2017 176 <200 mg/dL Final    Comment:                            Desirable                <200                         Borderline High      200- 239                         High                     >239    LDL Cholesterol (Calc)  Date Value Ref Range Status  06/23/2022  mg/dL (calc) Final    Comment:    . LDL cholesterol not calculated. Triglyceride levels greater than 400 mg/dL invalidate calculated LDL results. . Reference range: <100 . Desirable range <100 mg/dL for primary prevention;   <70 mg/dL for patients with CHD or diabetic patients  with > or = 2 CHD risk factors. Marland Kitchen LDL-C is now calculated using the Martin-Hopkins  calculation, which is a validated novel method providing  better accuracy than the Friedewald equation in the  estimation of LDL-C.  Horald Pollen et al. Lenox Ahr. 6433;295(18): 2061-2068  (http://education.[employer].com/faq/FAQ164)    HDL  Date Value Ref Range Status  06/23/2022 51 > OR = 50 mg/dL Final  84/16/6063 55 >01 mg/dL Final   Triglycerides  Date Value Ref Range Status  06/23/2022 449 (H) <150 mg/dL Final    Comment:    . If a non-fasting specimen was collected, consider repeat triglyceride testing on a fasting specimen if clinically indicated.  Perry Mount et al. J. of Clin. Lipidol. 2015;9:129-169. .    Triglycerides Piccolo,Waived  Date Value Ref Range  Status  04/23/2017 253 (H) <150 mg/dL Final    Comment:                            Normal                   <150                         Borderline High     150 - 199                         High                200 - 499  Very High                >499          Passed - AST in normal range and within 360 days    AST  Date Value Ref Range Status  06/23/2022 18 10 - 35 U/L Final   AST (SGOT) Piccolo, Waived  Date Value Ref Range Status  04/23/2017 24 11 - 38 U/L Final         Passed - ALT in normal range and within 360 days    ALT  Date Value Ref Range Status  06/23/2022 25 6 - 29 U/L Final   ALT (SGPT) Piccolo, Waived  Date Value Ref Range Status  04/23/2017 35 10 - 47 U/L Final         Passed - Patient is not pregnant      Passed - Valid encounter within last 12 months    Recent Outpatient Visits           3 months ago Dermatitis due to plants, including poison ivy, sumac, and oak   [place] [facility] Mecum, Erin E, PA-C   4 months ago Type 2 diabetes mellitus with diabetic polyneuropathy, with long-term current use of insulin [facility])   [place] [facility] [place], Salvadore [place], NP   4 months ago Encounter for general adult medical examination with abnormal findings   [place] [facility] [place], [place] W, NP   8 months ago Type 2 diabetes mellitus with diabetic polyneuropathy, with long-term current use of insulin [facility])   [place] [facility] [place], Salvadore Oxford, NP   8 months ago Viral URI with cough   [place] [facility] [place], Salvadore [place], [place]

## 2022-11-25 DIAGNOSIS — G4733 Obstructive sleep apnea (adult) (pediatric): Secondary | ICD-10-CM | POA: Diagnosis not present

## 2022-11-28 NOTE — Progress Notes (Unsigned)
Virtual Visit via Video Note  I connected with Tabitha [name] on 12/03/22 at  8:30 AM EDT by a video enabled telemedicine application and verified that I am speaking with the correct person using two identifiers.  Location: Patient: home Provider: office Persons participated in the visit- patient, provider    I discussed the limitations of evaluation and management by telemedicine and the availability of in person appointments. The patient expressed understanding and agreed to proceed.    I discussed the assessment and treatment plan with the patient. The patient was provided an opportunity to ask questions and all were answered. The patient agreed with the plan and demonstrated an understanding of the instructions.   The patient was advised to call back or seek an in-person evaluation if the symptoms worsen or if the condition fails to improve as anticipated.  I provided 20 minutes of non-face-to-face time during this encounter.   [name], MD    [facility] MD/PA/NP OP Progress Note  12/03/2022 8:59 AM Tabitha [name]  MRN:  [id number]  Chief Complaint:  Chief Complaint  Patient presents with   Follow-up   HPI:  This is a follow-up appointment for schizoaffective disorder, anxiety.  She states that she feels better this morning.  Although she does not know why, she was able to sleep good last night as well.  She has some days when her mind could not rest.  She thinks it has been better half-and-half since switching from olanzapine to Geodon.  She does not experience any hives anymore.  She was able to go to choir yesterday.  Although she was a little paranoid, it has been getting better.  She also feels comfortable being around with her family without staying in her room.  Although she has craving for sugary food, it has not been changed.  She sleeps up to 6 hours.  She feels less depressed.  She feels anxious at times.  She denies SI, hallucinations.  She denies decreased need for  sleep, euphoria. she would like to see how it goes with her current medication regimen at this time.  Daily routine: house chores, goes to church weekly. Choir on Wed,  Exercise: Employment: unemployed, on disability due to paranoia (could not keep her job due to paranoia that people coming to her workplace). used to work at [employer] until 2007 (quit due to pregnancy) Support: husband, son, parents Household: husband (unemployed, had gas gangrene in his right side extremities, adl independent), 2 sons (age 68,15) Marital status: married Number of children: 2 sons Education: college, majored in Psychologist, educational, music  Wt Readings from Last 3 Encounters:  09/18/22 232 lb (105.2 kg)  09/08/22 243 lb 3.2 oz (110.3 kg)  08/11/22 236 lb 9.6 oz (107.3 kg)     Visit Diagnosis:    ICD-10-CM   1. Obsessive-compulsive disorder, unspecified type  F42.9 Vilazodone HCl (VIIBRYD) 40 MG TABS    2. Schizoaffective disorder, depressive type (HCC)  F25.1 divalproex (DEPAKOTE ER) 250 MG 24 hr tablet    divalproex (DEPAKOTE ER) 500 MG 24 hr tablet      Past Psychiatric History: Please see initial evaluation for full details. I have reviewed the history. No updates at this time.     Past Medical History:  Past Medical History:  Diagnosis Date   Allergic rhinitis    Anxiety    Asthma    Bipolar disorder (HCC)    Depression    Diabetes mellitus without complication (HCC)    GERD (  gastroesophageal reflux disease)    Hyperlipidemia    Hypertension    Mild sleep apnea    OCD (obsessive compulsive disorder)    Palpitations    Paranoia (HCC)    Schizoaffective disorder (HCC)    Sleep apnea     Past Surgical History:  Procedure Laterality Date   CESAREAN SECTION  2006/2008   CHOLECYSTECTOMY  2006    Family Psychiatric History: Please see initial evaluation for full details. I have reviewed the history. No updates at this time.     Family History:  Family History  Problem Relation Age of Onset   Lupus  Mother    Diabetes Mother        pre diabetes   Diabetes Father    Hyperlipidemia Father    Diabetes Brother    Breast cancer Maternal Grandmother    Depression Paternal Grandmother    Bipolar disorder Paternal Grandmother    Cancer - Colon Neg Hx     Social History:  Social History   Socioeconomic History   Marital status: Married    Spouse name: Azaylah Stailey   Number of children: 2   Years of education: 16   Highest education level: Bachelor's degree (e.g., BA, AB, BS)  Occupational History   Occupation: disability  Tobacco Use   Smoking status: Never   Smokeless tobacco: Never  Vaping Use   Vaping status: Never Used  Substance and Sexual Activity   Alcohol use: Not Currently   Drug use: Never   Sexual activity: Not Currently    Partners: Male    Birth control/protection: Surgical  Other Topics Concern   Not on file  Social History Narrative   Not on file   Social Determinants of Health   Financial Resource Strain: Medium Risk (09/18/2022)   Overall Financial Resource Strain (CARDIA)    Difficulty of Paying Living Expenses: Somewhat hard  Food Insecurity: Food Insecurity Present (09/18/2022)   Hunger Vital Sign    Worried About Running Out of Food in the Last Year: Sometimes true    Ran Out of Food in the Last Year: Sometimes true  Transportation Needs: No Transportation Needs (09/18/2022)   PRAPARE - Administrator, Civil Service (Medical): No    Lack of Transportation (Non-Medical): No  Physical Activity: Inactive (09/18/2022)   Exercise Vital Sign    Days of Exercise per Week: 0 days    Minutes of Exercise per Session: 0 min  Stress: No Stress Concern Present (09/18/2022)   [employer] of Occupational Health - Occupational Stress Questionnaire    Feeling of Stress : Only a little  Social Connections: Moderately Integrated (09/18/2022)   Social Connection and Isolation Panel [NHANES]    Frequency of Communication with Friends and Family: More  than three times a week    Frequency of Social Gatherings with Friends and Family: Not on file    Attends Religious Services: Never    Database administrator or Organizations: Yes    Attends Banker Meetings: More than 4 times per year    Marital Status: Married    Allergies:  Allergies  Allergen Reactions   Abilify [Aripiprazole] Hives    Metabolic Disorder Labs: Lab Results  Component Value Date   HGBA1C 8.8 (H) 06/23/2022   MPG 206 06/23/2022   MPG 171 05/21/2021   Lab Results  Component Value Date   PROLACTIN 37.8 (H) 01/22/2022   PROLACTIN 84.8 (H) 06/01/2017   Lab Results  Component  Value Date   CHOL 149 06/23/2022   TRIG 449 (H) 06/23/2022   HDL 51 06/23/2022   CHOLHDL 2.9 06/23/2022   VLDL 51 (H) 04/23/2017   LDLCALC  06/23/2022     Comment:     . LDL cholesterol not calculated. Triglyceride levels greater than 400 mg/dL invalidate calculated LDL results. . Reference range: <100 . Desirable range <100 mg/dL for primary prevention;   <70 mg/dL for patients with CHD or diabetic patients  with > or = 2 CHD risk factors. Marland Kitchen LDL-C is now calculated using the Martin-Hopkins  calculation, which is a validated novel method providing  better accuracy than the Friedewald equation in the  estimation of LDL-C.  Horald Pollen et al. Lenox Ahr. 4098;119(14): 2061-2068  (http://education.Quest[employer]ostics.com/faq/FAQ164)    LDLCALC 69 03/23/2022   Lab Results  Component Value Date   TSH 1.026 01/22/2022   TSH 1.56 12/17/2021    Therapeutic Level Labs: No results found for: "LITHIUM" Lab Results  Component Value Date   VALPROATE 13 (L) 06/16/2022   VALPROATE 46 (L) 01/22/2022   No results found for: "CBMZ"  Current Medications: Current Outpatient Medications  Medication Sig Dispense Refill   ACCU-CHEK GUIDE test strip USE TO TEST TWICE DAILY, AS DIRECTED 100 strip 2   albuterol (VENTOLIN HFA) 108 (90 Base) MCG/ACT inhaler INHALE 2 PUFFS INTO THE  LUNGS EVERY 6 HOURS AS NEEDED FOR WHEEZING OR SHORTNESS OF BREATH 25.5 g 3   atorvastatin (LIPITOR) 40 MG tablet Take 1 tablet (40 mg total) by mouth daily. 90 tablet 1   [START ON 12/09/2022] divalproex (DEPAKOTE ER) 250 MG 24 hr tablet Take 1 tablet (250 mg total) by mouth daily. Take along with 500 mg tab. 30 tablet 2   [START ON 12/09/2022] divalproex (DEPAKOTE ER) 500 MG 24 hr tablet Take 1 tablet (500 mg total) by mouth daily. Take along with 250 mg p.o. daily-total of 750 mg p.o. daily. 30 tablet 1   EPINEPHrine 0.3 mg/0.3 mL IJ SOAJ injection Inject 0.3 mg into the muscle as needed for anaphylaxis. 1 each 0   ezetimibe (ZETIA) 10 MG tablet TAKE 1 TABLET(10 MG) BY MOUTH DAILY 90 tablet 0   fluticasone (FLOVENT HFA) 110 MCG/ACT inhaler Inhale 1 puff into the lungs 2 (two) times daily. 1 Inhaler 11   gabapentin (NEURONTIN) 600 MG tablet Take 1 tablet (600 mg total) by mouth 3 (three) times daily. 270 tablet 0   glipiZIDE (GLUCOTROL) 10 MG tablet TAKE 1 TABLET(10 MG) BY MOUTH TWICE DAILY BEFORE A MEAL 180 tablet 0   ibuprofen (ADVIL) 200 MG tablet Take 200 mg by mouth every 6 (six) hours as needed.     Insulin Pen Needle (BD PEN NEEDLE NANO U/F) 32G X 4 MM MISC 1 each by Does not apply route daily. 100 each 2   MELATONIN PO Take 5 mg by mouth at bedtime as needed.     metFORMIN (GLUCOPHAGE) 500 MG tablet TAKE 2 TABLETS(1000 MG) BY MOUTH TWICE DAILY WITH A MEAL 360 tablet 0   omeprazole (PRILOSEC) 10 MG capsule Take 1 capsule (10 mg total) by mouth daily. 90 capsule 1   predniSONE (DELTASONE) 20 MG tablet Take 2 tablets daily (40mg ) for 7 days, then take 1 tab daily (20mg ) for 7 days. (Patient not taking: Reported on 09/18/2022) 21 tablet 0   TRESIBA FLEXTOUCH 100 UNIT/ML FlexTouch Pen ADMINISTER 40 UNITS UNDER THE SKIN EVERY DAY 15 mL 2   Vilazodone HCl (VIIBRYD) 40 MG TABS Take  1 tablet (40 mg total) by mouth daily. 90 tablet 1   [START ON 12/26/2022] ziprasidone (GEODON) 20 MG capsule Take 1  capsule (20 mg total) by mouth 2 (two) times daily with a meal. 60 capsule 1   No current facility-administered medications for this visit.     Musculoskeletal: Strength & Muscle Tone:  N/A Gait & Station:  N/A Patient leans: N/A  Psychiatric Specialty Exam: Review of Systems  Psychiatric/Behavioral:  Positive for dysphoric mood and sleep disturbance. Negative for agitation, behavioral problems, confusion, decreased concentration, hallucinations, self-injury and suicidal ideas. The patient is nervous/anxious. The patient is not hyperactive.   All other systems reviewed and are negative.   There were no vitals taken for this visit.There is no height or weight on file to calculate BMI.  General Appearance: Fairly Groomed  Eye Contact:  Good  Speech:  Clear and Coherent  Volume:  Normal  Mood:   better today  Affect:  Appropriate, Congruent, and calm  Thought Process:  Coherent  Orientation:  Full (Time, Place, and Person)  Thought Content: Logical   Suicidal Thoughts:  No  Homicidal Thoughts:  No  Memory:  Immediate;   Good  Judgement:  Good  Insight:  Good  Psychomotor Activity:  Normal  Concentration:  Concentration: Good and Attention Span: Good  Recall:  Good  Fund of Knowledge: Good  Language: Good  Akathisia:  No  Handed:  Right  AIMS (if indicated): not done  Assets:  Communication Skills Desire for Improvement  ADL's:  Intact  Cognition: WNL  Sleep:  Fair   Screenings: Midwife Visit from 05/12/2017 in [facility] Office Visit from 07/29/2016 in [facility] Office Visit from 05/06/2016 in [facility] Office Visit from 04/01/2016 in [facility] Office Visit from 03/28/2015 in [facility]  AIMS Total Score 0 0 0 0 0      GAD-7    Flowsheet Row  Office Visit from 06/23/2022 in [facility] Office Visit from 06/16/2022 in [facility] Office Visit from 05/07/2022 in [facility] Office Visit from 03/23/2022 in [facility] Office Visit from 03/13/2022 in [facility]  Total GAD-7 Score 5 6 19 8 7       Mini-Mental    Flowsheet Row Office Visit from 09/07/2019 in [facility]  Total Score (max 30 points ) 16      PHQ2-9    Flowsheet Row Clinical Support from 09/18/2022 in [facility] Office Visit from 06/23/2022 in [facility] Office Visit from 06/16/2022 in [facility] Office Visit from 05[facility]07/2022 in [facility] Nutrition from 03/31/2022 in [facility] Total Score 6 2 2 2 3   PHQ-9 Total Score 9 9 9 17 12       Flowsheet Row Office Visit from 06/16/2022 in [facility] Office Visit from 05/07/2022 in [facility] Office Visit from 01/22/2022 in [facility]  C-SSRS RISK CATEGORY No Risk No Risk No Risk        Assessment and Plan:  Tabitha [name]  is a 52 y.o. year old female with a history of schizoaffective disorder,  sleep apnea (using CPAP machine), diabetes, hypertension, hyperlipidemia, GERD, who presents for follow up appointment for below.     1. Schizoaffective disorder, depressive type (HCC) 2. Obsessive-compulsive disorder, unspecified type 3. Anxiety state Acute stressors include:  Other stressors include: her husband being registered as a sex offender    History: Tx from Dr. Thea Gist. no psych admission. Originally on  Invega 234 mg IM. Perphenazine 6 mg daily, viibryd 40 mg daily, amantadine 100 mg BID, gabapentin 600 mg TID. Unable to continue injection since May due to financial strain     There is some improvement in paranoia, although she continues to struggle with racing thoughts since starting Geodon.  She had adverse reaction from olanzapine, and this medication was switched. She is unable to continue injection anymore due to financial strain. Although she may benefit from further titration, she prefers to stay at the current dose at this time as she feels better today.  She agrees to contact the office if any worsening in her symptoms.  Will continue Depakote at the current dose for mood dysregulation/off level given she reports good benefit.  She was advised again to obtain labs.  Will continue Viibryd to target depression, and gabapentin for anxiety.    # prolactin  The level has been trending down.  She denies symptoms of galactorrhea.  Will continue to monitor this.    Plan Continue Geodon 20 mg twice a day  Hold olanzapine, Invega injection Continue Depakote 750 mg daily  Obtain labs (CMP, LFT, VPA) at [facility] Continue Viibryd 40 mg daily Continue gabapentin 600 mg 3 times a day Next appointment-  9/9 at 11 am for 30 mins, video -unable to come for IP due to having only one vehicle - She [facility] - on metformin   Past trials of medication: Abilify (hives), olanzapine (hives from 7.5 mg, quetiapine, risperidone (stopped working),       Secretary/administrator of Care: Collaboration of Care: Other reviewed notes in Epic  Patient/Guardian was advised Release of Information must be obtained prior to any record release in order to collaborate their care with an outside provider. Patient/Guardian was advised if they have not already done so to contact the registration department to sign all necessary forms in order for [place] to release information regarding their care.   Consent:  Patient/Guardian gives verbal consent for treatment and assignment of benefits for services provided during this visit. Patient/Guardian expressed understanding and agreed to proceed.    [name], MD 12/03/2022, 8:59 AM

## 2022-12-02 ENCOUNTER — Telehealth: Payer: Self-pay | Admitting: Internal Medicine

## 2022-12-02 DIAGNOSIS — E119 Type 2 diabetes mellitus without complications: Secondary | ICD-10-CM

## 2022-12-02 NOTE — Telephone Encounter (Signed)
Patient is trying to get a refill at the pharmacy early for [facility] FLEXTOUCH 100 UNIT/ML FlexTouch Pen. Patient states that she has been using more of the [place] than the directions say because she was out of her Metformin. Please advise patient.

## 2022-12-03 ENCOUNTER — Other Ambulatory Visit: Payer: Self-pay | Admitting: Psychiatry

## 2022-12-03 ENCOUNTER — Telehealth (INDEPENDENT_AMBULATORY_CARE_PROVIDER_SITE_OTHER): Payer: Medicare Other | Admitting: Psychiatry

## 2022-12-03 ENCOUNTER — Encounter: Payer: Self-pay | Admitting: Psychiatry

## 2022-12-03 ENCOUNTER — Other Ambulatory Visit: Payer: Self-pay | Admitting: Internal Medicine

## 2022-12-03 DIAGNOSIS — F251 Schizoaffective disorder, depressive type: Secondary | ICD-10-CM

## 2022-12-03 DIAGNOSIS — F429 Obsessive-compulsive disorder, unspecified: Secondary | ICD-10-CM | POA: Diagnosis not present

## 2022-12-03 DIAGNOSIS — E119 Type 2 diabetes mellitus without complications: Secondary | ICD-10-CM

## 2022-12-03 MED ORDER — METFORMIN HCL 500 MG PO TABS
1000.0000 mg | ORAL_TABLET | Freq: Two times a day (BID) | ORAL | 0 refills | Status: DC
Start: 2022-12-03 — End: 2023-01-14

## 2022-12-03 MED ORDER — DIVALPROEX SODIUM ER 500 MG PO TB24: 500.0000 mg | ORAL_TABLET | Freq: Every day | ORAL | 1 refills | Status: DC

## 2022-12-03 MED ORDER — TRESIBA FLEXTOUCH 100 UNIT/ML ~~LOC~~ SOPN: PEN_INJECTOR | SUBCUTANEOUS | 0 refills | Status: DC

## 2022-12-03 MED ORDER — VILAZODONE HCL 40 MG PO TABS
40.0000 mg | ORAL_TABLET | Freq: Every day | ORAL | 1 refills | Status: DC
Start: 2022-12-03 — End: 2023-05-26

## 2022-12-03 MED ORDER — DIVALPROEX SODIUM ER 250 MG PO TB24
250.0000 mg | ORAL_TABLET | Freq: Every day | ORAL | 2 refills | Status: DC
Start: 2022-12-09 — End: 2023-02-25

## 2022-12-03 MED ORDER — ZIPRASIDONE HCL 20 MG PO CAPS: 20.0000 mg | ORAL_CAPSULE | Freq: Two times a day (BID) | ORAL | 1 refills | Status: DC

## 2022-12-03 NOTE — Telephone Encounter (Signed)
She is out of her metformin because she is not following up in the office like she should. She was due to be seen in June. Ok to refill Metformin and tresiba x 1 month but she needs to be seen in office this month for follow up

## 2022-12-03 NOTE — Telephone Encounter (Signed)
Pt advised.  Apt scheduled for 12/16/2022   Thanks,   -Vernona Rieger

## 2022-12-03 NOTE — Patient Instructions (Signed)
Continue Geodon 20 mg twice a day  Continue Depakote 750 mg daily  Obtain labs (CMP, LFT, VPA) at [facility] Continue Viibryd 40 mg daily Continue gabapentin 600 mg 3 times a day Next appointment-  9/9 at 11 am

## 2022-12-04 NOTE — Telephone Encounter (Signed)
Unable to refill per protocol, Rx request is too soon. Last refill 12/03/22.E-Prescribing Status: Receipt confirmed by pharmacy (12/03/2022 10:54 AM EDT).  Requested Prescriptions  Pending Prescriptions Disp Refills   metFORMIN (GLUCOPHAGE) 500 MG tablet [Pharmacy Med Name: METFORMIN 500MG  TABLETS] 360 tablet     Sig: TAKE 2 TABLETS(1000 MG) BY MOUTH TWICE DAILY WITH A MEAL     Endocrinology:  Diabetes - Biguanides Failed - 12/03/2022 11:15 AM      Failed - HBA1C is between 0 and 7.9 and within 180 days    HB A1C (BAYER DCA - WAIVED)  Date Value Ref Range Status  10/22/2017 6.9 <7.0 % Final    Comment:                                          Diabetic Adult            <7.0                                       Healthy Adult        4.3 - 5.7                                                           (DCCT/NGSP) American Diabetes Association's Summary of Glycemic Recommendations for Adults with Diabetes: Hemoglobin A1c <7.0%. More stringent glycemic goals (A1c <6.0%) may further reduce complications at the cost of increased risk of hypoglycemia.    Hgb A1c MFr Bld  Date Value Ref Range Status  06/23/2022 8.8 (H) <5.7 % of total Hgb Final    Comment:    For someone without known diabetes, a hemoglobin A1c value of 6.5% or greater indicates that they may have  diabetes and this should be confirmed with a follow-up  test. . For someone with known diabetes, a value <7% indicates  that their diabetes is well controlled and a value  greater than or equal to 7% indicates suboptimal  control. A1c targets should be individualized based on  duration of diabetes, age, comorbid conditions, and  other considerations. . Currently, no consensus exists regarding use of hemoglobin A1c for diagnosis of diabetes for children. .          Failed - B12 Level in normal range and within 720 days    Vitamin B-12  Date Value Ref Range Status  06/01/2017 578 232 - 1,245 pg/mL Final         Failed -  CBC within normal limits and completed in the last 12 months    WBC  Date Value Ref Range Status  06/23/2022 7.5 3.8 - 10.8 Thousand/uL Final   RBC  Date Value Ref Range Status  06/23/2022 4.42 3.80 - 5.10 Million/uL Final   Hemoglobin  Date Value Ref Range Status  06/23/2022 13.0 11.7 - 15.5 g/dL Final  16/01/9603 54.0 11.1 - 15.9 g/dL Final   HCT  Date Value Ref Range Status  06/23/2022 38.8 35.0 - 45.0 % Final   Hematocrit  Date Value Ref Range Status  04/28/2018 39.6 34.0 - 46.6 % Final   MCHC  Date Value  Ref Range Status  06/23/2022 33.5 32.0 - 36.0 g/dL Final   [facility]  Date Value Ref Range Status  06/23/2022 29.4 27.0 - 33.0 pg Final   MCV  Date Value Ref Range Status  06/23/2022 87.8 80.0 - 100.0 fL Final  04/28/2018 86 79 - 97 fL Final   No results found for: "PLTCOUNTKUC", "LABPLAT", "POCPLA" RDW  Date Value Ref Range Status  06/23/2022 14.5 11.0 - 15.0 % Final  04/28/2018 15.1 12.3 - 15.4 % Final    Comment:    **Effective May 02, 2018, the RDW pediatric reference**   interval will be removed and the adult reference interval   will be changing to:                             Female 11.7 - 15.4                                                      Female 11.6 - 15.4          Passed - Cr in normal range and within 360 days    Creat  Date Value Ref Range Status  06/23/2022 0.69 0.50 - 1.03 mg/dL Final   Creatinine, Urine  Date Value Ref Range Status  03/23/2022 26 20 - 275 mg/dL Final         Passed - eGFR in normal range and within 360 days    GFR calc Af Amer  Date Value Ref Range Status  09/07/2019 88 >59 mL/min/1.73 Final    Comment:    **[facility] currently reports eGFR in compliance with the current**   recommendations of the [employer]. [facility] will   update reporting as new guidelines are published from the NKF-ASN   Task force.    GFR calc non Af Amer  Date Value Ref Range Status  09/07/2019 76 >59 mL/min/1.73 Final    eGFR  Date Value Ref Range Status  06/23/2022 106 > OR = 60 mL/min/1.72m2 Final         Passed - Valid encounter within last 6 months    Recent Outpatient Visits           4 months ago Dermatitis due to plants, including poison ivy, sumac, and oak   [facility], Erin E, PA-C   5 months ago Type 2 diabetes mellitus with diabetic polyneuropathy, with long-term current use of insulin [facility])   [facility] [place], Salvadore Oxford, NP   5 months ago Encounter for general adult medical examination with abnormal findings   [place] [place], [place] W, NP   8 months ago Type 2 diabetes mellitus with diabetic polyneuropathy, with long-term current use of insulin [facility]   [place] [facility] [place], Salvadore Oxford, NP   8 months ago Viral URI with cough   [place] [facility] [place], Salvadore Oxford, NP       Future Appointments             In 1 week Sam[place] Si, Salvadore Oxford, NP  [facility]

## 2022-12-16 ENCOUNTER — Ambulatory Visit: Payer: Medicare Other | Admitting: Internal Medicine

## 2022-12-27 NOTE — Progress Notes (Signed)
Virtual Visit via Video Note  I connected with Tabitha [name] on 01/04/23 at 11:00 AM EDT by a video enabled telemedicine application and verified that I am speaking with the correct person using two identifiers.  Location: Patient: home Provider: office Persons participated in the visit- patient, provider   I discussed the limitations of evaluation and management by telemedicine and the availability of in person appointments. The patient expressed understanding and agreed to proceed.    I discussed the assessment and treatment plan with the patient. The patient was provided an opportunity to ask questions and all were answered. The patient agreed with the plan and demonstrated an understanding of the instructions.   The patient was advised to call back or seek an in-person evaluation if the symptoms worsen or if the condition fails to improve as anticipated.  I provided 20 minutes of non-face-to-face time during this encounter.   [name], MD      [facility] MD/PA/NP OP Progress Note  01/04/2023 11:32 AM Tabitha [name]  MRN:  [id number]  Chief Complaint:  Chief Complaint  Patient presents with   Follow-up   HPI:  This is a follow-up appointment for schizoaffective disorder, anxiety.  She states that she wakes up, feeling anxious.  She feels that somebody is going to hurt her, and talking about her.  She also has paranoia when she is in school year at the church, although she still goes there twice a week.  She does not go outside of the house as much since she does not have a vehicle. She does errands during the day. She reports fair relationship with her husband at home.  Her children drives on their own to go to school.  She is concerned about her anxiety, although she cannot explain.  She tends to be worried about things would be like.  She denies feeling something going out of her body.  She sleeps six hours.  She feels fatigued.  She denies SI, hallucinations.  She denies  depressive or euphoria.  She agrees with the plan of.    Daily routine: house chores, goes to church weekly. Choir on Wed,  Exercise: Employment: unemployed, on disability due to paranoia (could not keep her job due to paranoia that people coming to her workplace). used to work at [employer] until 2007 (quit due to pregnancy) Support: husband, son, parents Household: husband (unemployed, had gas gangrene in his right side extremities s/p bilateral amputation, adl independent), 2 sons Marital status: married Number of children: 2 sons Education: college, majored in Psychologist, educational, music  Visit Diagnosis:    ICD-10-CM   1. Schizoaffective disorder, depressive type (HCC)  F25.1     2. Obsessive-compulsive disorder, unspecified type  F42.9     3. Anxiety state  F41.1     4. High risk medication use  Z79.899 CBC    Comprehensive metabolic panel    Valproic acid level    CBC    Comprehensive metabolic panel    Valproic acid level      Past Psychiatric History: Please see initial evaluation for full details. I have reviewed the history. No updates at this time.     Past Medical History:  Past Medical History:  Diagnosis Date   Allergic rhinitis    Anxiety    Asthma    Bipolar disorder (HCC)    Depression    Diabetes mellitus without complication (HCC)    GERD (gastroesophageal reflux disease)    Hyperlipidemia    Hypertension  Mild sleep apnea    OCD (obsessive compulsive disorder)    Palpitations    Paranoia (HCC)    Schizoaffective disorder (HCC)    Sleep apnea     Past Surgical History:  Procedure Laterality Date   CESAREAN SECTION  2006/2008   CHOLECYSTECTOMY  2006    Family Psychiatric History: Please see initial evaluation for full details. I have reviewed the history. No updates at this time.     Family History:  Family History  Problem Relation Age of Onset   Lupus Mother    Diabetes Mother        pre diabetes   Diabetes Father    Hyperlipidemia Father     Diabetes Brother    Breast cancer Maternal Grandmother    Depression Paternal Grandmother    Bipolar disorder Paternal Grandmother    Cancer - Colon Neg Hx     Social History:  Social History   Socioeconomic History   Marital status: Married    Spouse name: Domenica Siems   Number of children: 2   Years of education: 16   Highest education level: Bachelor's degree (e.g., BA, AB, BS)  Occupational History   Occupation: disability  Tobacco Use   Smoking status: Never   Smokeless tobacco: Never  Vaping Use   Vaping status: Never Used  Substance and Sexual Activity   Alcohol use: Not Currently   Drug use: Never   Sexual activity: Not Currently    Partners: Male    Birth control/protection: Surgical  Other Topics Concern   Not on file  Social History Narrative   Not on file   Social Determinants of Health   Financial Resource Strain: Medium Risk (12/30/2022)   Overall Financial Resource Strain (CARDIA)    Difficulty of Paying Living Expenses: Somewhat hard  Food Insecurity: Food Insecurity Present (12/30/2022)   Hunger Vital Sign    Worried About Running Out of Food in the Last Year: Sometimes true    Ran Out of Food in the Last Year: Sometimes true  Transportation Needs: Unmet Transportation Needs (12/30/2022)   PRAPARE - Administrator, Civil Service (Medical): Yes    Lack of Transportation (Non-Medical): No  Physical Activity: Inactive (12/30/2022)   Exercise Vital Sign    Days of Exercise per Week: 0 days    Minutes of Exercise per Session: 0 min  Stress: Stress Concern Present (12/30/2022)   [employer] of Occupational Health - Occupational Stress Questionnaire    Feeling of Stress : To some extent  Social Connections: Socially Integrated (12/30/2022)   Social Connection and Isolation Panel [NHANES]    Frequency of Communication with Friends and Family: Three times a week    Frequency of Social Gatherings with Friends and Family: Once a week    Attends  Religious Services: More than 4 times per year    Active Member of [employer] or Organizations: Yes    Attends Engineer, structural: More than 4 times per year    Marital Status: Married    Allergies:  Allergies  Allergen Reactions   Abilify [Aripiprazole] Hives    Metabolic Disorder Labs: Lab Results  Component Value Date   HGBA1C 7.9 (A) 12/30/2022   MPG 206 06/23/2022   MPG 171 05/21/2021   Lab Results  Component Value Date   PROLACTIN 37.8 (H) 01/22/2022   PROLACTIN 84.8 (H) 06/01/2017   Lab Results  Component Value Date   CHOL 149 06/23/2022   TRIG 449 (H)  06/23/2022   HDL 51 06/23/2022   CHOLHDL 2.9 06/23/2022   VLDL 51 (H) 04/23/2017   LDLCALC  06/23/2022     Comment:     . LDL cholesterol not calculated. Triglyceride levels greater than 400 mg/dL invalidate calculated LDL results. . Reference range: <100 . Desirable range <100 mg/dL for primary prevention;   <70 mg/dL for patients with CHD or diabetic patients  with > or = 2 CHD risk factors. Marland Kitchen LDL-C is now calculated using the Martin-Hopkins  calculation, which is a validated novel method providing  better accuracy than the Friedewald equation in the  estimation of LDL-C.  Horald Pollen et al. Lenox Ahr. 1610;960(45): 2061-2068  (http://education.[employer].com/faq/FAQ164)    LDLCALC 69 03/23/2022   Lab Results  Component Value Date   TSH 1.026 01/22/2022   TSH 1.56 12/17/2021    Therapeutic Level Labs: No results found for: "LITHIUM" Lab Results  Component Value Date   VALPROATE 13 (L) 06/16/2022   VALPROATE 46 (L) 01/22/2022   No results found for: "CBMZ"  Current Medications: Current Outpatient Medications  Medication Sig Dispense Refill   ziprasidone (GEODON) 40 MG capsule Take 1 capsule (40 mg total) by mouth in the morning. 30 capsule 1   ACCU-CHEK GUIDE test strip USE TO TEST TWICE DAILY, AS DIRECTED 100 strip 2   albuterol (VENTOLIN HFA) 108 (90 Base) MCG/ACT inhaler INHALE 2  PUFFS INTO THE LUNGS EVERY 6 HOURS AS NEEDED FOR WHEEZING OR SHORTNESS OF BREATH 25.5 g 3   atorvastatin (LIPITOR) 40 MG tablet Take 1 tablet (40 mg total) by mouth daily. 90 tablet 1   divalproex (DEPAKOTE ER) 250 MG 24 hr tablet Take 1 tablet (250 mg total) by mouth daily. Take along with 500 mg tab. 30 tablet 2   divalproex (DEPAKOTE ER) 500 MG 24 hr tablet Take 1 tablet (500 mg total) by mouth daily. Take along with 250 mg p.o. daily-total of 750 mg p.o. daily. 30 tablet 1   EPINEPHrine 0.3 mg/0.3 mL IJ SOAJ injection Inject 0.3 mg into the muscle as needed for anaphylaxis. 1 each 0   ezetimibe (ZETIA) 10 MG tablet TAKE 1 TABLET(10 MG) BY MOUTH DAILY 90 tablet 0   fluticasone (FLOVENT HFA) 110 MCG/ACT inhaler Inhale 1 puff into the lungs 2 (two) times daily. 1 Inhaler 11   gabapentin (NEURONTIN) 600 MG tablet Take 1 tablet (600 mg total) by mouth 3 (three) times daily. 270 tablet 0   glipiZIDE (GLUCOTROL) 10 MG tablet TAKE 1 TABLET(10 MG) BY MOUTH TWICE DAILY BEFORE A MEAL 180 tablet 0   ibuprofen (ADVIL) 200 MG tablet Take 200 mg by mouth every 6 (six) hours as needed.     insulin degludec (TRESIBA FLEXTOUCH) 100 UNIT/ML FlexTouch Pen ADMINISTER 40 UNITS UNDER THE SKIN EVERY DAY 15 mL 0   Insulin Pen Needle (BD PEN NEEDLE NANO U/F) 32G X 4 MM MISC 1 each by Does not apply route daily. 100 each 2   MELATONIN PO Take 5 mg by mouth at bedtime as needed.     metFORMIN (GLUCOPHAGE) 500 MG tablet Take 2 tablets (1,000 mg total) by mouth 2 (two) times daily with a meal. 120 tablet 0   omeprazole (PRILOSEC) 10 MG capsule Take 1 capsule (10 mg total) by mouth daily. 90 capsule 1   Vilazodone HCl (VIIBRYD) 40 MG TABS Take 1 tablet (40 mg total) by mouth daily. 90 tablet 1   ziprasidone (GEODON) 20 MG capsule Take 1 capsule (20 mg total)  by mouth 2 (two) times daily with a meal. 60 capsule 1   No current facility-administered medications for this visit.     Musculoskeletal: Strength & Muscle Tone:   N/A Gait & Station:  N/A Patient leans: N/A  Psychiatric Specialty Exam: Review of Systems  Psychiatric/Behavioral:  Positive for dysphoric mood and sleep disturbance. Negative for agitation, behavioral problems, confusion, decreased concentration, hallucinations, self-injury and suicidal ideas. The patient is nervous/anxious. The patient is not hyperactive.   All other systems reviewed and are negative.   There were no vitals taken for this visit.There is no height or weight on file to calculate BMI.  General Appearance: Fairly Groomed  Eye Contact:  Good  Speech:  Clear and Coherent  Volume:  Normal  Mood:  Anxious  Affect:  Appropriate, Congruent, and Restricted  Thought Process:  Coherent  Orientation:  Full (Time, Place, and Person)  Thought Content: Logical   Suicidal Thoughts:  No  Homicidal Thoughts:  No  Memory:  Immediate;   Good  Judgement:  Good  Insight:  Good  Psychomotor Activity:  Normal  Concentration:  Concentration: Good and Attention Span: Good  Recall:  Good  Fund of Knowledge: Good  Language: Good  Akathisia:  No  Handed:  Right  AIMS (if indicated): not done  Assets:  Communication Skills Desire for Improvement  ADL's:  Intact  Cognition: WNL  Sleep:  Fair   Screenings: Midwife Visit from 05/12/2017 in [facility] Office Visit from 07/29/2016 in [facility] Office Visit from 05/06/2016 in [facility] Office Visit from 04/01/2016 [facility] Visit from 03/28/2015 in [facility]  AIMS Total Score 0 0 0 0 0      GAD-7    Flowsheet Row Office Visit from 12/30/2022 in [facility] Office Visit from 06/23/2022 in [facility] Office Visit from 06/16/2022 in [facility] Visit from 05/07/2022 in [facility] Office Visit from 03/23/2022 in [facility]  Total GAD-7 Score 14 5 6 19 8       Mini-Mental    Flowsheet Row Office Visit from 09/07/2019 in [facility]  Total Score (max 30 points ) 16      PHQ2-9    Flowsheet Row Office Visit from 12/30/2022 in [facility] Clinical Support from 09/18/2022 in [facility] Office Visit from 06/23/2022 in [facility] Office Visit from 06/16/2022 in [facility] Office Visit from 05/07/2022 in [facility]  PHQ-2 Total Score 2 6 2 2 2   PHQ-9 Total Score 13 9 9 9 17       Flowsheet Row Office Visit from 06/16/2022 in [facility] Office Visit from 05/07/2022 in [facility] Office Visit from 01/22/2022 in [facility]  C-SSRS RISK CATEGORY No Risk No Risk No Risk        Assessment and Plan:  Tabitha [name] is a 52 y.o. year old female with a history of schizoaffective disorder,  sleep apnea (using CPAP machine), diabetes, hypertension, hyperlipidemia, GERD, who presents for follow up appointment for below.  1. Schizoaffective disorder, depressive type (HCC) 2. Obsessive-compulsive disorder, unspecified type 3. Anxiety state Acute stressors include:  Other stressors include: her husband being registered as a sex offender    History: Tx from Dr. Thea Gist. no psych admission. Originally on Invega 234 mg IM. Perphenazine 6 mg daily, viibryd 40 mg daily, amantadine 100 mg BID, gabapentin 600 mg TID. Unable to continue injection since May due to financial strain     She reports worsening in paranoia, and anxiety  since the last visit.  Will titrate Geodon to optimize treatment for schizoaffective disorder and paranoia.  Discussed potential risk of akathisia, metabolic side effects, EPS.  Will continue current dose of Depakote at this time to target mood dysregulation.  She was advised again to obtain labs as it has not been checked since Feb.  Will continue Viibryd to target depression, and gabapentin for anxiety.   4. High risk medication use She was advised again to obtain labs to safely continue Depakote.    Plan Continue Geodon 20 mg twice a day  (QTc 441 msec, HR 76, 10/2022) Continue Depakote 750 mg daily  Obtain labs (CMP, CBC, VPA) at [facility] Continue Viibryd 40 mg daily Continue gabapentin 600 mg 3 times a day Next appointment- 10/31 at 10 am for 30 mins IP - She sees [facility] P[facility] at [facility] - on metformin - will plan to recheck prolactin in the future   Past trials of medication: Abilify (hives), olanzapine (hives from 7.5 mg, quetiapine, risperidone (stopped working),    The patient demonstrates the following risk factors for suicide: Chronic risk factors for suicide include: psychiatric disorder of schizoaffective disorder. Acute risk factors for suicide include: unemployment. Protective factors for this patient include: positive social support, responsibility to others (children, family), hope for the future and religious beliefs against suicide. Considering these factors, the overall suicide risk at this point appears to be low. Patient is appropriate for outpatient follow up.  Although she has guns at home, she does not have access to them.    Collaboration of Care: Collaboration of Care: Other reviewed notes in Epic  Patient/Guardian was advised Release of Information must be obtained prior to any record release in order to collaborate their care with an outside provider. Patient/Guardian was advised if they have not already done so to contact the registration department to  sign all necessary forms in order for [place] to release information regarding their care.   Consent: Patient/Guardian gives verbal consent for treatment and assignment of benefits for services provided during this visit. Patient/Guardian expressed understanding and agreed to proceed.    [name], MD 01/04/2023, 11:32 AM

## 2022-12-30 ENCOUNTER — Ambulatory Visit (INDEPENDENT_AMBULATORY_CARE_PROVIDER_SITE_OTHER): Payer: Medicare Other | Admitting: Internal Medicine

## 2022-12-30 VITALS — BP 128/68 | HR 78 | Temp 96.6°F | Wt 244.0 lb

## 2022-12-30 DIAGNOSIS — K219 Gastro-esophageal reflux disease without esophagitis: Secondary | ICD-10-CM | POA: Diagnosis not present

## 2022-12-30 DIAGNOSIS — F251 Schizoaffective disorder, depressive type: Secondary | ICD-10-CM

## 2022-12-30 DIAGNOSIS — J4530 Mild persistent asthma, uncomplicated: Secondary | ICD-10-CM | POA: Diagnosis not present

## 2022-12-30 DIAGNOSIS — E782 Mixed hyperlipidemia: Secondary | ICD-10-CM | POA: Diagnosis not present

## 2022-12-30 DIAGNOSIS — Z23 Encounter for immunization: Secondary | ICD-10-CM

## 2022-12-30 DIAGNOSIS — Z6841 Body Mass Index (BMI) 40.0 and over, adult: Secondary | ICD-10-CM

## 2022-12-30 DIAGNOSIS — F3175 Bipolar disorder, in partial remission, most recent episode depressed: Secondary | ICD-10-CM

## 2022-12-30 DIAGNOSIS — R519 Headache, unspecified: Secondary | ICD-10-CM

## 2022-12-30 DIAGNOSIS — E1142 Type 2 diabetes mellitus with diabetic polyneuropathy: Secondary | ICD-10-CM

## 2022-12-30 DIAGNOSIS — Z794 Long term (current) use of insulin: Secondary | ICD-10-CM | POA: Diagnosis not present

## 2022-12-30 DIAGNOSIS — G4733 Obstructive sleep apnea (adult) (pediatric): Secondary | ICD-10-CM

## 2022-12-30 DIAGNOSIS — K591 Functional diarrhea: Secondary | ICD-10-CM

## 2022-12-30 DIAGNOSIS — E1159 Type 2 diabetes mellitus with other circulatory complications: Secondary | ICD-10-CM

## 2022-12-30 DIAGNOSIS — F422 Mixed obsessional thoughts and acts: Secondary | ICD-10-CM

## 2022-12-30 DIAGNOSIS — I152 Hypertension secondary to endocrine disorders: Secondary | ICD-10-CM

## 2022-12-30 LAB — POCT GLYCOSYLATED HEMOGLOBIN (HGB A1C): Hemoglobin A1C: 7.9 % — AB (ref 4.0–5.6)

## 2022-12-30 NOTE — Assessment & Plan Note (Signed)
Encourage weight loss as this can help reduce sleep apnea symptoms Continue CPAP use 

## 2022-12-30 NOTE — Assessment & Plan Note (Signed)
Continue Depakote, Geodon and Viibryd per psychiatry

## 2022-12-30 NOTE — Progress Notes (Signed)
Subjective:    Patient ID: Tabitha [name], female    DOB: 1970/07/16, 52 y.o.   MRN: [id number]  HPI  Patient presents to clinic today for follow-up of chronic conditions.  HTN: Her BP today is 128/68.  She is not taking any antihypertensive medication at this time.  ECG from 10/2022 reviewed.  OSA: She averages 7 hours of sleep per night with the use of her CPAP.  There is no sleep study on file.  She follows with pulmonology.  Asthma: She denies chronic cough but reports intermittent shortness of breath.  She is using flovent and albuterol as prescribed.  There are no PFTs on file.  She follows with pulmonology.  GERD: Triggered by spicy foods.  She has occasional breakthrough on omeprazole for which she takes tums as needed with good relief of symptoms.  Upper GI from 11/2012 reviewed.  Diarrhea: Chronic, managed with imodium as needed.  There is no colonoscopy on file.  She does not follow with GI.  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  She is taking metformin, glipizide, tresiba and gabapentin as prescribed.  Her sugars range 140-320.  She does not check her feet routinely.  Her last eye exam was.  Flu 02/2022.  COVID never.  Pneumovax 04/2015.  Prevnar 20 04/2021.  She does not follow with endocrinology.  OCD/schizoaffective/bipolar disorder: Managed with depakote, geodon and viibryd.  She is currently seeing a therapist and a psychiatrist.  She denies SI/HI.  HLD: Her last LDL was not calculated, triglycerides 449, 05/2022.  She denies myalgias on last atorvastatin and ezetimibe.  She does not consume a low-fat diet.  Frequent headaches: These occur a few times per month.  Triggered by stress.  She takes ibuprofen as needed with good relief of symptoms.  She does not follow with neurology.  Review of Systems     Past Medical History:  Diagnosis Date   Allergic rhinitis    Anxiety    Asthma    Bipolar disorder (HCC)    Depression    Diabetes mellitus without  complication (HCC)    GERD (gastroesophageal reflux disease)    Hyperlipidemia    Hypertension    Mild sleep apnea    OCD (obsessive compulsive disorder)    Palpitations    Paranoia (HCC)    Schizoaffective disorder (HCC)    Sleep apnea     Current Outpatient Medications  Medication Sig Dispense Refill   ACCU-CHEK GUIDE test strip USE TO TEST TWICE DAILY, AS DIRECTED 100 strip 2   albuterol (VENTOLIN HFA) 108 (90 Base) MCG/ACT inhaler INHALE 2 PUFFS INTO THE LUNGS EVERY 6 HOURS AS NEEDED FOR WHEEZING OR SHORTNESS OF BREATH 25.5 g 3   atorvastatin (LIPITOR) 40 MG tablet Take 1 tablet (40 mg total) by mouth daily. 90 tablet 1   divalproex (DEPAKOTE ER) 250 MG 24 hr tablet Take 1 tablet (250 mg total) by mouth daily. Take along with 500 mg tab. 30 tablet 2   divalproex (DEPAKOTE ER) 500 MG 24 hr tablet Take 1 tablet (500 mg total) by mouth daily. Take along with 250 mg p.o. daily-total of 750 mg p.o. daily. 30 tablet 1   EPINEPHrine 0.3 mg/0.3 mL IJ SOAJ injection Inject 0.3 mg into the muscle as needed for anaphylaxis. 1 each 0   ezetimibe (ZETIA) 10 MG tablet TAKE 1 TABLET(10 MG) BY MOUTH DAILY 90 tablet 0   fluticasone (FLOVENT HFA) 110 MCG/ACT inhaler Inhale 1 puff into the  lungs 2 (two) times daily. 1 Inhaler 11   gabapentin (NEURONTIN) 600 MG tablet Take 1 tablet (600 mg total) by mouth 3 (three) times daily. 270 tablet 0   glipiZIDE (GLUCOTROL) 10 MG tablet TAKE 1 TABLET(10 MG) BY MOUTH TWICE DAILY BEFORE A MEAL 180 tablet 0   ibuprofen (ADVIL) 200 MG tablet Take 200 mg by mouth every 6 (six) hours as needed.     insulin degludec (TRESIBA FLEXTOUCH) 100 UNIT/ML FlexTouch Pen ADMINISTER 40 UNITS UNDER THE SKIN EVERY DAY 15 mL 0   Insulin Pen Needle (BD PEN NEEDLE NANO U/F) 32G X 4 MM MISC 1 each by Does not apply route daily. 100 each 2   MELATONIN PO Take 5 mg by mouth at bedtime as needed.     metFORMIN (GLUCOPHAGE) 500 MG tablet Take 2 tablets (1,000 mg total) by mouth 2 (two) times  daily with a meal. 120 tablet 0   omeprazole (PRILOSEC) 10 MG capsule Take 1 capsule (10 mg total) by mouth daily. 90 capsule 1   predniSONE (DELTASONE) 20 MG tablet Take 2 tablets daily (40mg ) for 7 days, then take 1 tab daily (20mg ) for 7 days. (Patient not taking: Reported on 09/18/2022) 21 tablet 0   Vilazodone HCl (VIIBRYD) 40 MG TABS Take 1 tablet (40 mg total) by mouth daily. 90 tablet 1   ziprasidone (GEODON) 20 MG capsule Take 1 capsule (20 mg total) by mouth 2 (two) times daily with a meal. 60 capsule 1   No current facility-administered medications for this visit.    Allergies  Allergen Reactions   Abilify [Aripiprazole] Hives    Family History  Problem Relation Age of Onset   Lupus Mother    Diabetes Mother        pre diabetes   Diabetes Father    Hyperlipidemia Father    Diabetes Brother    Breast cancer Maternal Grandmother    Depression Paternal Grandmother    Bipolar disorder Paternal Grandmother    Cancer - Colon Neg Hx     Social History   Socioeconomic History   Marital status: Married    Spouse name: Tashvi Francies   Number of children: 2   Years of education: 16   Highest education level: Bachelor's degree (e.g., BA, AB, BS)  Occupational History   Occupation: disability  Tobacco Use   Smoking status: Never   Smokeless tobacco: Never  Vaping Use   Vaping status: Never Used  Substance and Sexual Activity   Alcohol use: Not Currently   Drug use: Never   Sexual activity: Not Currently    Partners: Male    Birth control/protection: Surgical  Other Topics Concern   Not on file  Social History Narrative   Not on file   Social Determinants of Health   Financial Resource Strain: Medium Risk (09/18/2022)   Overall Financial Resource Strain (CARDIA)    Difficulty of Paying Living Expenses: Somewhat hard  Food Insecurity: Food Insecurity Present (09/18/2022)   Hunger Vital Sign    Worried About Running Out of Food in the Last Year: Sometimes true     Ran Out of Food in the Last Year: Sometimes true  Transportation Needs: No Transportation Needs (09/18/2022)   PRAPARE - Administrator, Civil Service (Medical): No    Lack of Transportation (Non-Medical): No  Physical Activity: Inactive (09/18/2022)   Exercise Vital Sign    Days of Exercise per Week: 0 days    Minutes of Exercise per  Session: 0 min  Stress: No Stress Concern Present (09/18/2022)   [employer] of Occupational Health - Occupational Stress Questionnaire    Feeling of Stress : Only a little  Social Connections: Moderately Integrated (09/18/2022)   Social Connection and Isolation Panel [NHANES]    Frequency of Communication with Friends and Family: More than three times a week    Frequency of Social Gatherings with Friends and Family: Not on file    Attends Religious Services: Never    Active Member of Clubs or Organizations: Yes    Attends Banker Meetings: More than 4 times per year    Marital Status: Married  Catering manager Violence: Not At Risk (09/18/2022)   Humiliation, Afraid, Rape, and Kick questionnaire    Fear of Current or Ex-Partner: No    Emotionally Abused: No    Physically Abused: No    Sexually Abused: No     Constitutional: Patient reports intermittent headaches.  Denies fever, malaise, fatigue, or abrupt weight changes.  HEENT: Denies eye pain, eye redness, ear pain, ringing in the ears, wax buildup, runny nose, nasal congestion, bloody nose, or sore throat. Respiratory: Patient reports intermittent shortness of breath.  Denies difficulty breathing, cough or sputum production.   Cardiovascular: Denies chest pain, chest tightness, palpitations or swelling in the hands or feet.  Gastrointestinal: Patient reports intermittent diarrhea.  Denies abdominal pain, bloating, constipation, or blood in the stool.  GU: Denies urgency, frequency, pain with urination, burning sensation, blood in urine, odor or discharge. Musculoskeletal:  Denies decrease in range of motion, difficulty with gait, muscle pain or joint pain and swelling.  Skin: Denies redness, rashes, lesions or ulcercations.  Neurological: Patient reports neuropathic pain.  Denies dizziness, difficulty with memory, difficulty with speech or problems with balance and coordination.  Psych: Patient has a history of depression.  Denies anxiety, SI/HI.  No other specific complaints in a complete review of systems (except as listed in HPI above).  Objective:   Physical Exam  There were no vitals taken for this visit. Wt Readings from Last 3 Encounters:  09/18/22 232 lb (105.2 kg)  07/30/22 232 lb (105.2 kg)  06/23/22 244 lb (110.7 kg)    General: Appears their stated age, well developed, well nourished in NAD. Skin: Warm, dry and intact. No rashes, lesions or ulcerations noted. HEENT: Head: normal shape and size; Eyes: sclera white, no icterus, conjunctiva pink, PERRLA and EOMs intact; Ears: Tm's gray and intact, normal light reflex; Nose: mucosa pink and moist, septum midline; Throat/Mouth: Teeth present, mucosa pink and moist, no exudate, lesions or ulcerations noted.  Neck:  Neck supple, trachea midline. No masses, lumps or thyromegaly present.  Cardiovascular: Normal rate and rhythm. S1,S2 noted.  No murmur, rubs or gallops noted. No JVD or BLE edema. No carotid bruits noted. Pulmonary/Chest: Normal effort and positive vesicular breath sounds. No respiratory distress. No wheezes, rales or ronchi noted.  Abdomen: Soft and nontender. Normal bowel sounds. No distention or masses noted. Liver, spleen and kidneys non palpable. Musculoskeletal: Normal range of motion. No signs of joint swelling. No difficulty with gait.  Neurological: Alert and oriented. Cranial nerves II-XII grossly intact. Coordination normal.  Psychiatric: Mood and affect normal. Behavior is normal. Judgment and thought content normal.    BMET    Component Value Date/Time   NA 137 06/23/2022  0952   NA 137 09/07/2019 1657   K 4.5 06/23/2022 0952   CL 101 06/23/2022 0952   CO2 25 06/23/2022 1610  GLUCOSE 280 (H) 06/23/2022 0952   BUN 13 06/23/2022 0952   BUN 14 09/07/2019 1657   CREATININE 0.69 06/23/2022 0952   CALCIUM 9.5 06/23/2022 0952   GFRNONAA 76 09/07/2019 1657   GFRAA 88 09/07/2019 1657    Lipid Panel     Component Value Date/Time   CHOL 149 06/23/2022 0952   CHOL 138 09/07/2019 1657   CHOL 176 04/23/2017 1341   TRIG 449 (H) 06/23/2022 0952   TRIG 253 (H) 04/23/2017 1341   HDL 51 06/23/2022 0952   HDL 55 09/07/2019 1657   CHOLHDL 2.9 06/23/2022 0952   VLDL 51 (H) 04/23/2017 1341   LDLCALC  06/23/2022 0952     Comment:     . LDL cholesterol not calculated. Triglyceride levels greater than 400 mg/dL invalidate calculated LDL results. . Reference range: <100 . Desirable range <100 mg/dL for primary prevention;   <70 mg/dL for patients with CHD or diabetic patients  with > or = 2 CHD risk factors. Marland Kitchen LDL-C is now calculated using the Martin-Hopkins  calculation, which is a validated novel method providing  better accuracy than the Friedewald equation in the  estimation of LDL-C.  Horald Pollen et al. Lenox Ahr. 3086;578(46): 2061-2068  (http://education.[facility].com/faq/FAQ164)     CBC    Component Value Date/Time   WBC 7.5 06/23/2022 0952   RBC 4.42 06/23/2022 0952   HGB 13.0 06/23/2022 0952   HGB 13.3 04/28/2018 1604   HCT 38.8 06/23/2022 0952   HCT 39.6 04/28/2018 1604   PLT 196 06/23/2022 0952   PLT 201 04/28/2018 1604   MCV 87.8 06/23/2022 0952   MCV 86 04/28/2018 1604   MCH 29.4 06/23/2022 0952   MCHC 33.5 06/23/2022 0952   RDW 14.5 06/23/2022 0952   RDW 15.1 04/28/2018 1604   LYMPHSABS 2.4 04/28/2018 1604   MONOABS 378 09/26/2015 1154   EOSABS 0.1 04/28/2018 1604   BASOSABS 0.0 04/28/2018 1604    Hgb A1C Lab Results  Component Value Date   HGBA1C 8.8 (H) 06/23/2022            Assessment & Plan:   RTC in 3  months, follow-up chronic conditions Nicki Reaper, NP

## 2022-12-30 NOTE — Assessment & Plan Note (Signed)
Encourage stress reduction techniques Continue ibuprofen as needed

## 2022-12-30 NOTE — Assessment & Plan Note (Signed)
C-Met and lipid profile today Encouraged her to consume a low-fat diet Continue atorvastatin and ezetimibe

## 2022-12-30 NOTE — Assessment & Plan Note (Signed)
Avoid foods that trigger reflux Encourage weight loss as this can help reduce reflux symptoms Continue omeprazole and Tums

## 2022-12-30 NOTE — Assessment & Plan Note (Signed)
Continue Flovent and albuterol

## 2022-12-30 NOTE — Assessment & Plan Note (Signed)
Encouraged high-fiber diet Continue Imodium as needed

## 2022-12-30 NOTE — Assessment & Plan Note (Addendum)
POCT A1c 7.9 We will check urine microalbumin Encouraged her to consume a low-carb diet and exercise for weight loss Continue metformin, glipizide, Tresiba and gabapentin Will have her titrate her [place] by 1 unit every 3 days until fasting sugars are <120.  Advised her if Evaristo Bury is more than 50 units, that she will need to divide this between 2 doses in the morning and evening. Encouraged routine eye exam Encouraged routine foot exam Encouraged her to get her COVID-vaccine

## 2022-12-30 NOTE — Assessment & Plan Note (Signed)
Encourage diet and exercise for weight loss 

## 2022-12-30 NOTE — Assessment & Plan Note (Signed)
Not medicated Reinforced DASH diet and exercise for weight loss

## 2022-12-31 ENCOUNTER — Ambulatory Visit: Payer: Medicare Other | Admitting: Cardiology

## 2023-01-04 ENCOUNTER — Encounter: Payer: Self-pay | Admitting: Psychiatry

## 2023-01-04 ENCOUNTER — Telehealth (INDEPENDENT_AMBULATORY_CARE_PROVIDER_SITE_OTHER): Payer: Medicare Other | Admitting: Psychiatry

## 2023-01-04 DIAGNOSIS — F411 Generalized anxiety disorder: Secondary | ICD-10-CM

## 2023-01-04 DIAGNOSIS — F429 Obsessive-compulsive disorder, unspecified: Secondary | ICD-10-CM | POA: Diagnosis not present

## 2023-01-04 DIAGNOSIS — F251 Schizoaffective disorder, depressive type: Secondary | ICD-10-CM | POA: Diagnosis not present

## 2023-01-04 DIAGNOSIS — Z79899 Other long term (current) drug therapy: Secondary | ICD-10-CM

## 2023-01-04 MED ORDER — ZIPRASIDONE HCL 40 MG PO CAPS: 40.0000 mg | ORAL_CAPSULE | Freq: Every morning | ORAL | 1 refills | Status: DC

## 2023-01-04 NOTE — Patient Instructions (Signed)
Continue Geodon 20 mg twice a day Continue Depakote 750 mg daily  Obtain labs (CMP, CBC, VPA) at [facility] or [facility] Continue Viibryd 40 mg daily Continue gabapentin 600 mg 3 times a day Next appointment- 10/31 at 10 am

## 2023-01-07 ENCOUNTER — Other Ambulatory Visit: Payer: Self-pay

## 2023-01-07 DIAGNOSIS — E1159 Type 2 diabetes mellitus with other circulatory complications: Secondary | ICD-10-CM

## 2023-01-07 DIAGNOSIS — E119 Type 2 diabetes mellitus without complications: Secondary | ICD-10-CM

## 2023-01-07 DIAGNOSIS — Z794 Long term (current) use of insulin: Secondary | ICD-10-CM | POA: Diagnosis not present

## 2023-01-07 NOTE — Addendum Note (Signed)
Addended by: Kavin Leech E on: 01/07/2023 12:01 PM   Modules accepted: Orders

## 2023-01-08 LAB — CBC WITH DIFFERENTIAL/PLATELET
Absolute Monocytes: 460 {cells}/uL (ref 200–950)
Basophils Absolute: 32 {cells}/uL (ref 0–200)
Basophils Relative: 0.5 %
Eosinophils Absolute: 158 {cells}/uL (ref 15–500)
Eosinophils Relative: 2.5 %
HCT: 36.9 % (ref 35.0–45.0)
Hemoglobin: 12.4 g/dL (ref 11.7–15.5)
Lymphs Abs: 2690 {cells}/uL (ref 850–3900)
MCH: 30 pg (ref 27.0–33.0)
MCHC: 33.6 g/dL (ref 32.0–36.0)
MCV: 89.1 fL (ref 80.0–100.0)
MPV: 11 fL (ref 7.5–12.5)
Monocytes Relative: 7.3 %
Neutro Abs: 2961 {cells}/uL (ref 1500–7800)
Neutrophils Relative %: 47 %
Platelets: 146 10*3/uL (ref 140–400)
RBC: 4.14 10*6/uL (ref 3.80–5.10)
RDW: 13.6 % (ref 11.0–15.0)
Total Lymphocyte: 42.7 %
WBC: 6.3 10*3/uL (ref 3.8–10.8)

## 2023-01-08 LAB — COMPREHENSIVE METABOLIC PANEL
AG Ratio: 1.8 (calc) (ref 1.0–2.5)
ALT: 26 U/L (ref 6–29)
AST: 18 U/L (ref 10–35)
Albumin: 4.1 g/dL (ref 3.6–5.1)
Alkaline phosphatase (APISO): 81 U/L (ref 37–153)
BUN: 12 mg/dL (ref 7–25)
CO2: 26 mmol/L (ref 20–32)
Calcium: 9.3 mg/dL (ref 8.6–10.4)
Chloride: 96 mmol/L — ABNORMAL LOW (ref 98–110)
Creat: 0.72 mg/dL (ref 0.50–1.03)
Globulin: 2.3 g/dL (ref 1.9–3.7)
Glucose, Bld: 281 mg/dL — ABNORMAL HIGH (ref 65–99)
Potassium: 4.2 mmol/L (ref 3.5–5.3)
Sodium: 132 mmol/L — ABNORMAL LOW (ref 135–146)
Total Bilirubin: 0.5 mg/dL (ref 0.2–1.2)
Total Protein: 6.4 g/dL (ref 6.1–8.1)

## 2023-01-08 LAB — LIPID PANEL
Cholesterol: 133 mg/dL (ref <200)
HDL: 48 mg/dL — ABNORMAL LOW (ref >=50)
LDL Cholesterol (Calc): 52 mg/dL
Non-HDL Cholesterol (Calc): 85 mg/dL (ref <130)
Total CHOL/HDL Ratio: 2.8 (calc) (ref <5.0)
Triglycerides: 317 mg/dL — ABNORMAL HIGH (ref <150)

## 2023-01-08 LAB — MICROALBUMIN / CREATININE URINE RATIO
Creatinine, Urine: 14 mg/dL — ABNORMAL LOW (ref 20–275)
Microalb, Ur: <0.2 mg/dL

## 2023-01-13 ENCOUNTER — Other Ambulatory Visit: Payer: Self-pay | Admitting: Internal Medicine

## 2023-01-13 DIAGNOSIS — E119 Type 2 diabetes mellitus without complications: Secondary | ICD-10-CM

## 2023-01-14 ENCOUNTER — Other Ambulatory Visit: Payer: Self-pay | Admitting: Internal Medicine

## 2023-01-14 DIAGNOSIS — E1142 Type 2 diabetes mellitus with diabetic polyneuropathy: Secondary | ICD-10-CM

## 2023-01-14 NOTE — Telephone Encounter (Signed)
Requested Prescriptions  Pending Prescriptions Disp Refills   metFORMIN (GLUCOPHAGE) 500 MG tablet [Pharmacy Med Name: METFORMIN 500MG  TABLETS] 120 tablet 0    Sig: TAKE 2 TABLETS(1000 MG) BY MOUTH TWICE DAILY WITH A MEAL     Endocrinology:  Diabetes - Biguanides Failed - 01/13/2023  9:02 AM      Failed - B12 Level in normal range and within 720 days    Vitamin B-12  Date Value Ref Range Status  06/01/2017 578 232 - 1,245 pg/mL Final         Passed - Cr in normal range and within 360 days    Creat  Date Value Ref Range Status  01/07/2023 0.72 0.50 - 1.03 mg/dL Final   Creatinine, Urine  Date Value Ref Range Status  01/07/2023 14 (L) 20 - 275 mg/dL Final         Passed - HBA1C is between 0 and 7.9 and within 180 days    Hemoglobin A1C  Date Value Ref Range Status  12/30/2022 7.9 (A) 4.0 - 5.6 % Final   HB A1C (BAYER DCA - WAIVED)  Date Value Ref Range Status  10/22/2017 6.9 <7.0 % Final    Comment:                                          Diabetic Adult            <7.0                                       Healthy Adult        4.3 - 5.7                                                           (DCCT/NGSP) American Diabetes Association's Summary of Glycemic Recommendations for Adults with Diabetes: Hemoglobin A1c <7.0%. More stringent glycemic goals (A1c <6.0%) may further reduce complications at the cost of increased risk of hypoglycemia.    Hgb A1c MFr Bld  Date Value Ref Range Status  06/23/2022 8.8 (H) <5.7 % of total Hgb Final    Comment:    For someone without known diabetes, a hemoglobin A1c value of 6.5% or greater indicates that they may have  diabetes and this should be confirmed with a follow-up  test. . For someone with known diabetes, a value <7% indicates  that their diabetes is well controlled and a value  greater than or equal to 7% indicates suboptimal  control. A1c targets should be individualized based on  duration of diabetes, age, comorbid  conditions, and  other considerations. . Currently, no consensus exists regarding use of hemoglobin A1c for diagnosis of diabetes for children. .          Passed - eGFR in normal range and within 360 days    GFR calc Af Amer  Date Value Ref Range Status  09/07/2019 88 >59 mL/min/1.73 Final    Comment:    **[facility] currently reports eGFR in compliance with the current**   recommendations of the [employer]. [facility] will   update reporting as new  guidelines are published from the NKF-ASN   Task force.    GFR calc non Af Amer  Date Value Ref Range Status  09/07/2019 76 >59 mL/min/1.73 Final   eGFR  Date Value Ref Range Status  06/23/2022 106 > OR = 60 mL/min/1.18m2 Final         Passed - Valid encounter within last 6 months    Recent Outpatient Visits           2 weeks ago Type 2 diabetes mellitus with diabetic polyneuropathy, with long-term current use of insulin (HCC)   [place] [facility] [place], Salvadore Oxford, NP   5 months ago Dermatitis due to plants, including poison ivy, sumac, and oak   [place] [facility] Mecum, Erin E, PA-C   6 months ago Type 2 diabetes mellitus with diabetic polyneuropathy, with long-term current use of insulin (HCC)   [place] [facility] [place], Salvadore Oxford, NP   6 months ago Encounter for general adult medical examination with abnormal findings   [place] [facility] [place], Salvadore Oxford, NP   9 months ago Type 2 diabetes mellitus with diabetic polyneuropathy, with long-term current use of insulin (HCC)   [place] [facility] [place], Salvadore Oxford, NP       Future Appointments             In 5 months Baity, Salvadore Oxford, NP [place] [facility]            Passed - CBC within normal limits and completed in the last 12 months    WBC  Date Value Ref Range Status  01/07/2023 6.3 3.8 - 10.8 Thousand/uL Final   RBC  Date  Value Ref Range Status  01/07/2023 4.14 3.80 - 5.10 Million/uL Final   Hemoglobin  Date Value Ref Range Status  01/07/2023 12.4 11.7 - 15.5 g/dL Final  16/01/9603 54.0 11.1 - 15.9 g/dL Final   HCT  Date Value Ref Range Status  01/07/2023 36.9 35.0 - 45.0 % Final   Hematocrit  Date Value Ref Range Status  04/28/2018 39.6 34.0 - 46.6 % Final   MCHC  Date Value Ref Range Status  01/07/2023 33.6 32.0 - 36.0 g/dL Final   [facility]  Date Value Ref Range Status  01/07/2023 30.0 27.0 - 33.0 pg Final   MCV  Date Value Ref Range Status  01/07/2023 89.1 80.0 - 100.0 fL Final  04/28/2018 86 79 - 97 fL Final   No results found for: "PLTCOUNTKUC", "LABPLAT", "POCPLA" RDW  Date Value Ref Range Status  01/07/2023 13.6 11.0 - 15.0 % Final  04/28/2018 15.1 12.3 - 15.4 % Final    Comment:    **Effective May 02, 2018, the RDW pediatric reference**   interval will be removed and the adult reference interval   will be changing to:                             Female 11.7 - 15.4                                                      Female 11.6 - 15.4

## 2023-01-15 NOTE — Telephone Encounter (Signed)
Requested Prescriptions  Pending Prescriptions Disp Refills   glipiZIDE (GLUCOTROL) 10 MG tablet [Pharmacy Med Name: GLIPIZIDE 10MG  TABLETS] 180 tablet 1    Sig: TAKE 1 TABLET(10 MG) BY MOUTH TWICE DAILY BEFORE A MEAL     Endocrinology:  Diabetes - Sulfonylureas Passed - 01/14/2023  7:02 AM      Passed - HBA1C is between 0 and 7.9 and within 180 days    Hemoglobin A1C  Date Value Ref Range Status  12/30/2022 7.9 (A) 4.0 - 5.6 % Final   HB A1C (BAYER DCA - WAIVED)  Date Value Ref Range Status  10/22/2017 6.9 <7.0 % Final    Comment:                                          Diabetic Adult            <7.0                                       Healthy Adult        4.3 - 5.7                                                           (DCCT/NGSP) American Diabetes Association's Summary of Glycemic Recommendations for Adults with Diabetes: Hemoglobin A1c <7.0%. More stringent glycemic goals (A1c <6.0%) may further reduce complications at the cost of increased risk of hypoglycemia.    Hgb A1c MFr Bld  Date Value Ref Range Status  06/23/2022 8.8 (H) <5.7 % of total Hgb Final    Comment:    For someone without known diabetes, a hemoglobin A1c value of 6.5% or greater indicates that they may have  diabetes and this should be confirmed with a follow-up  test. . For someone with known diabetes, a value <7% indicates  that their diabetes is well controlled and a value  greater than or equal to 7% indicates suboptimal  control. A1c targets should be individualized based on  duration of diabetes, age, comorbid conditions, and  other considerations. . Currently, no consensus exists regarding use of hemoglobin A1c for diagnosis of diabetes for children. .          Passed - Cr in normal range and within 360 days    Creat  Date Value Ref Range Status  01/07/2023 0.72 0.50 - 1.03 mg/dL Final   Creatinine, Urine  Date Value Ref Range Status  01/07/2023 14 (L) 20 - 275 mg/dL Final          Passed - Valid encounter within last 6 months    Recent Outpatient Visits           2 weeks ago Type 2 diabetes mellitus with diabetic polyneuropathy, with long-term current use of insulin [facility])   [place] [facility] [place], [place] W, NP   5 months ago Dermatitis due to plants, including poison ivy, sumac, and oak   [place] [facility] Mecum, Erin E, PA-C   6 months ago Type 2 diabetes mellitus with diabetic polyneuropathy, with long-term current use of insulin (HCC)  [place] [facility] [place], Salvadore Oxford, NP   6 months ago Encounter for general adult medical examination with abnormal findings   [place] [facility] [place], [place], NP   9 months ago Type 2 diabetes mellitus with diabetic polyneuropathy, with long-term current use of insulin [facility])   [place] [facility] [place], Salvadore [place], NP       Future Appointments             In 5 months Baity, Salvadore [place], NP [place] [facility]             atorvastatin (LIPITOR) 40 MG tablet [Pharmacy Med Name: ATORVASTATIN 40MG  TABLETS] 90 tablet 3    Sig: TAKE 1 TABLET(40 MG) BY MOUTH DAILY     Cardiovascular:  Antilipid - Statins Failed - 01/14/2023  7:02 AM      Failed - Lipid Panel in normal range within the last 12 months    Cholesterol, Total  Date Value Ref Range Status  09/07/2019 138 100 - 199 mg/dL Final   Cholesterol  Date Value Ref Range Status  01/07/2023 133 <200 mg/dL Final   Cholesterol Piccolo, Waived  Date Value Ref Range Status  04/23/2017 176 <200 mg/dL Final    Comment:                            Desirable                <200                         Borderline High      200- 239                         High                     >239    LDL Cholesterol (Calc)  Date Value Ref Range Status  01/07/2023 52 mg/dL (calc) Final    Comment:    Reference range: <100 . Desirable range <100 mg/dL  for primary prevention;   <70 mg/dL for patients with CHD or diabetic patients  with > or = 2 CHD risk factors. Marland Kitchen LDL-C is now calculated using the Martin-Hopkins  calculation, which is a validated novel method providing  better accuracy than the Friedewald equation in the  estimation of LDL-C.  Horald Pollen et al. Len[place] Ahr. 8119;147(82): 2061-2068  (http://education.Quest[facility].com/faq/FAQ164)    HDL  Date Value Ref Range Status  01/07/2023 48 (L) > OR = 50 mg/dL Final  95/62/1308 55 >65 mg/dL Final   Triglycerides  Date Value Ref Range Status  01/07/2023 317 (H) <150 mg/dL Final    Comment:    . If a non-fasting specimen was collected, consider repeat triglyceride testing on a fasting specimen if clinically indicated.  Perry Mount et al. J. of Clin. Lipidol. 2015;9:129-169. .    Triglycerides Piccolo,Waived  Date Value Ref Range Status  04/23/2017 253 (H) <150 mg/dL Final    Comment:                            Normal                   <150  Borderline High     150 - 199                         High                200 - 499                         Very High                >499          Passed - Patient is not pregnant      Passed - Valid encounter within last 12 months    Recent Outpatient Visits           2 weeks ago Type 2 diabetes mellitus with diabetic polyneuropathy, with long-term current use of insulin [facility])   [place] [facility] [place], Salvadore Oxford, NP   5 months ago Dermatitis due to plants, including poison ivy, sumac, and oak   [place] [facility] Mecum, Erin E, PA-C   6 months ago Type 2 diabetes mellitus with diabetic polyneuropathy, with long-term current use of insulin [facility])   [place] [facility] [place], Salvadore [place], NP   6 months ago Encounter for general adult medical examination with abnormal findings   [place] [facility] [place], [place] W, NP   9 months  ago Type 2 diabetes mellitus with diabetic polyneuropathy, with long-term current use of insulin [facility])   [place] [facility] [place], Salvadore Oxford, NP       Future Appointments             In 5 months Baity, Salvadore [place], NP [place] [facility], [facility]

## 2023-01-29 ENCOUNTER — Other Ambulatory Visit: Payer: Self-pay | Admitting: Psychiatry

## 2023-01-29 ENCOUNTER — Other Ambulatory Visit: Payer: Self-pay | Admitting: Internal Medicine

## 2023-01-29 DIAGNOSIS — F251 Schizoaffective disorder, depressive type: Secondary | ICD-10-CM

## 2023-01-29 NOTE — Telephone Encounter (Signed)
Requested Prescriptions  Pending Prescriptions Disp Refills   glucose blood (ACCU-CHEK GUIDE) test strip [Pharmacy Med Name: ACCU-CHEK GUIDE TEST STRIPS 100S] 200 strip 0    Sig: USE TO TEST TWICE DAILY AS DIRECTED     Endocrinology: Diabetes - Testing Supplies Passed - 01/29/2023  1:10 PM      Passed - Valid encounter within last 12 months    Recent Outpatient Visits           1 month ago Type 2 diabetes mellitus with diabetic polyneuropathy, with long-term current use of insulin [facility])   [place] [facility] [place], Salvadore Oxford, NP   6 months ago Dermatitis due to plants, including poison ivy, sumac, and oak   [place] [facility] Mecum, Erin E, PA-C   6 months ago Type 2 diabetes mellitus with diabetic polyneuropathy, with long-term current use of insulin [facility])   [place] [facility] [place], Salvadore Oxford, NP   7 months ago Encounter for general adult medical examination with abnormal findings   [place] [facility] [place], [place] W, NP   10 months ago Type 2 diabetes mellitus with diabetic polyneuropathy, with long-term current use of insulin [facility])   [place] [place], Salvadore Oxford, NP       Future Appointments             In 5 months Baity, Salvadore [place], NP [place] [facility], [facility]

## 2023-02-01 ENCOUNTER — Other Ambulatory Visit: Payer: Self-pay | Admitting: Psychiatry

## 2023-02-01 DIAGNOSIS — F251 Schizoaffective disorder, depressive type: Secondary | ICD-10-CM

## 2023-02-13 ENCOUNTER — Other Ambulatory Visit: Payer: Self-pay | Admitting: Psychiatry

## 2023-02-13 DIAGNOSIS — F251 Schizoaffective disorder, depressive type: Secondary | ICD-10-CM

## 2023-02-14 ENCOUNTER — Other Ambulatory Visit: Payer: Self-pay | Admitting: Psychiatry

## 2023-02-14 DIAGNOSIS — F251 Schizoaffective disorder, depressive type: Secondary | ICD-10-CM

## 2023-02-19 NOTE — Progress Notes (Unsigned)
BH MD/PA/NP OP Progress Note  02/25/2023 10:45 AM Tabitha [name]  MRN:  [id number]  Chief Complaint:  Chief Complaint  Patient presents with   Follow-up   HPI:  This is a follow-up appointment for schizoaffective disorder.  She states that thoughts has been better.  However, she continues to be concerned that people are talking about her.  She thinks that way at choir and at home.  She starts to feel that she does not want to go for choir anymore.  Although her husband told her not to think about it, it has been difficult.  She also feels that her family do not want to be around her.  She feels that she is getting on their nerves.  She stays in the house most of the time, doing cleaning.  Although there is a party scheduled for her family, she does not feel like doing anything.  She sleeps up to 5 hours.  She has middle insomnia due to worsening follow-up, worried about things.  She feels depressed.  She feels that her mood fluctuates.  Although she sometimes enjoy time with her children, her mood crashes, feeling anxious, starting to worried about things.  She denies SI.  She has AH of her family calling her.  She denies VH.  She denies decreased need for sleep or euphoria.  She has not noticed any side effects from ziprasidone, and is willing to try higher.   Daily routine: house chores, goes to church weekly. Choir on Wed,  Exercise: Employment: unemployed, on disability due to paranoia (could not keep her job due to paranoia that people coming to her workplace). used to work at [employer] until 2007 (quit due to pregnancy) Support: husband, son, parents Household: husband (unemployed, had gas gangrene in his right side extremities s/p bilateral amputation, adl independent), 2 sons Marital status: married Number of children: 2 sons Education: college, majored in Psychologist, educational, music   Wt Readings from Last 3 Encounters:  02/25/23 245 lb 6.4 oz (111.3 kg)  12/30/22 244 lb (110.7 kg)  09/18/22 232 lb  (105.2 kg)     Visit Diagnosis:    ICD-10-CM   1. Schizoaffective disorder, depressive type (HCC)  F25.1 divalproex (DEPAKOTE ER) 250 MG 24 hr tablet    2. Obsessive-compulsive disorder, unspecified type  F42.9     3. Anxiety state  F41.1       Past Psychiatric History: Please see initial evaluation for full details. I have reviewed the history. No updates at this time.     Past Medical History:  Past Medical History:  Diagnosis Date   Allergic rhinitis    Anxiety    Asthma    Bipolar disorder (HCC)    Depression    Diabetes mellitus without complication (HCC)    GERD (gastroesophageal reflux disease)    Hyperlipidemia    Hypertension    Mild sleep apnea    OCD (obsessive compulsive disorder)    Palpitations    Paranoia (HCC)    Schizoaffective disorder (HCC)    Sleep apnea     Past Surgical History:  Procedure Laterality Date   CESAREAN SECTION  2006/2008   CHOLECYSTECTOMY  2006    Family Psychiatric History: Please see initial evaluation for full details. I have reviewed the history. No updates at this time.     Family History:  Family History  Problem Relation Age of Onset   Lupus Mother    Diabetes Mother        pre  diabetes   Diabetes Father    Hyperlipidemia Father    Diabetes Brother    Breast cancer Maternal Grandmother    Depression Paternal Grandmother    Bipolar disorder Paternal Grandmother    Cancer - Colon Neg Hx     Social History:  Social History   Socioeconomic History   Marital status: Married    Spouse name: Nadine Tenenbaum   Number of children: 2   Years of education: 16   Highest education level: Bachelor's degree (e.g., BA, AB, BS)  Occupational History   Occupation: disability  Tobacco Use   Smoking status: Never   Smokeless tobacco: Never  Vaping Use   Vaping status: Never Used  Substance and Sexual Activity   Alcohol use: Not Currently   Drug use: Never   Sexual activity: Not Currently    Partners: Male    Birth  control/protection: Surgical  Other Topics Concern   Not on file  Social History Narrative   Not on file   Social Determinants of Health   Financial Resource Strain: Medium Risk (12/30/2022)   Overall Financial Resource Strain (CARDIA)    Difficulty of Paying Living Expenses: Somewhat hard  Food Insecurity: Food Insecurity Present (12/30/2022)   Hunger Vital Sign    Worried About Running Out of Food in the Last Year: Sometimes true    Ran Out of Food in the Last Year: Sometimes true  Transportation Needs: Unmet Transportation Needs (12/30/2022)   PRAPARE - Administrator, Civil Service (Medical): Yes    Lack of Transportation (Non-Medical): No  Physical Activity: Inactive (12/30/2022)   Exercise Vital Sign    Days of Exercise per Week: 0 days    Minutes of Exercise per Session: 0 min  Stress: Stress Concern Present (12/30/2022)   [employer] of Occupational Health - Occupational Stress Questionnaire    Feeling of Stress : To some extent  Social Connections: Socially Integrated (12/30/2022)   Social Connection and Isolation Panel [NHANES]    Frequency of Communication with Friends and Family: Three times a week    Frequency of Social Gatherings with Friends and Family: Once a week    Attends Religious Services: More than 4 times per year    Active Member of [employer] or Organizations: Yes    Attends Engineer, structural: More than 4 times per year    Marital Status: Married    Allergies:  Allergies  Allergen Reactions   Abilify [Aripiprazole] Hives    Metabolic Disorder Labs: Lab Results  Component Value Date   HGBA1C 7.9 (A) 12/30/2022   MPG 206 06/23/2022   MPG 171 05/21/2021   Lab Results  Component Value Date   PROLACTIN 37.8 (H) 01/22/2022   PROLACTIN 84.8 (H) 06/01/2017   Lab Results  Component Value Date   CHOL 133 01/07/2023   TRIG 317 (H) 01/07/2023   HDL 48 (L) 01/07/2023   CHOLHDL 2.8 01/07/2023   VLDL 51 (H) 04/23/2017   LDLCALC 52  01/07/2023   LDLCALC  06/23/2022     Comment:     . LDL cholesterol not calculated. Triglyceride levels greater than 400 mg/dL invalidate calculated LDL results. . Reference range: <100 . Desirable range <100 mg/dL for primary prevention;   <70 mg/dL for patients with CHD or diabetic patients  with > or = 2 CHD risk factors. Marland Kitchen LDL-C is now calculated using the Martin-Hopkins  calculation, which is a validated novel method providing  better accuracy than the [employer]  equation in the  estimation of LDL-C.  Horald Pollen et al. Lenox Ahr. 7829;562(13): 2061-2068  (http://education.[facility].com/faq/FAQ164)    Lab Results  Component Value Date   TSH 1.026 01/22/2022   TSH 1.56 12/17/2021    Therapeutic Level Labs: No results found for: "LITHIUM" Lab Results  Component Value Date   VALPROATE 13 (L) 06/16/2022   VALPROATE 46 (L) 01/22/2022   No results found for: "CBMZ"  Current Medications: Current Outpatient Medications  Medication Sig Dispense Refill   albuterol (VENTOLIN HFA) 108 (90 Base) MCG/ACT inhaler INHALE 2 PUFFS INTO THE LUNGS EVERY 6 HOURS AS NEEDED FOR WHEEZING OR SHORTNESS OF BREATH 25.5 g 3   atorvastatin (LIPITOR) 40 MG tablet TAKE 1 TABLET(40 MG) BY MOUTH DAILY 90 tablet 3   divalproex (DEPAKOTE ER) 500 MG 24 hr tablet Take 1 tablet (500 mg total) by mouth daily. Take along with 250 mg p.o. daily-total of 750 mg p.o. daily. 90 tablet 0   EPINEPHrine 0.3 mg/0.3 mL IJ SOAJ injection Inject 0.3 mg into the muscle as needed for anaphylaxis. 1 each 0   ezetimibe (ZETIA) 10 MG tablet TAKE 1 TABLET(10 MG) BY MOUTH DAILY 90 tablet 0   fluticasone (FLOVENT HFA) 110 MCG/ACT inhaler Inhale 1 puff into the lungs 2 (two) times daily. 1 Inhaler 11   gabapentin (NEURONTIN) 600 MG tablet Take 1 tablet (600 mg total) by mouth 3 (three) times daily. 270 tablet 0   glipiZIDE (GLUCOTROL) 10 MG tablet TAKE 1 TABLET(10 MG) BY MOUTH TWICE DAILY BEFORE A MEAL 180 tablet 1   glucose  blood (ACCU-CHEK GUIDE) test strip USE TO TEST TWICE DAILY AS DIRECTED 200 strip 0   ibuprofen (ADVIL) 200 MG tablet Take 200 mg by mouth every 6 (six) hours as needed.     insulin degludec (TRESIBA FLEXTOUCH) 100 UNIT/ML FlexTouch Pen ADMINISTER 40 UNITS UNDER THE SKIN EVERY DAY 15 mL 0   Insulin Pen Needle (BD PEN NEEDLE NANO U/F) 32G X 4 MM MISC 1 each by Does not apply route daily. 100 each 2   MELATONIN PO Take 5 mg by mouth at bedtime as needed.     metFORMIN (GLUCOPHAGE) 500 MG tablet TAKE 2 TABLETS(1000 MG) BY MOUTH TWICE DAILY WITH A MEAL 120 tablet 0   omeprazole (PRILOSEC) 10 MG capsule Take 1 capsule (10 mg total) by mouth daily. 90 capsule 1   Vilazodone HCl (VIIBRYD) 40 MG TABS Take 1 tablet (40 mg total) by mouth daily. 90 tablet 1   [START ON 03/09/2023] divalproex (DEPAKOTE ER) 250 MG 24 hr tablet Take 1 tablet (250 mg total) by mouth daily. Take along with 500 mg tab. 90 tablet 0   ziprasidone (GEODON) 40 MG capsule Take 1 capsule (40 mg total) by mouth 2 (two) times daily with a meal. 60 capsule 1   No current facility-administered medications for this visit.     Musculoskeletal: Strength & Muscle Tone:  Normal Gait & Station: normal Patient leans: N/A  Psychiatric Specialty Exam: Review of Systems  Psychiatric/Behavioral:  Positive for decreased concentration, dysphoric mood, hallucinations and sleep disturbance. Negative for agitation, behavioral problems, confusion, self-injury and suicidal ideas. The patient is nervous/anxious. The patient is not hyperactive.   All other systems reviewed and are negative.   Blood pressure 139/81, pulse 77, temperature 98 F (36.7 C), temperature source Skin, height 5\' 2"  (1.575 m), weight 245 lb 6.4 oz (111.3 kg).Body mass index is 44.88 kg/m.  General Appearance: Well Groomed  Eye Contact:  Good  Speech:  Clear and Coherent  Volume:  Normal  Mood:  Depressed  Affect:  Appropriate, Congruent, and Tearful  Thought Process:   Coherent  Orientation:  Full (Time, Place, and Person)  Thought Content: Logical   Suicidal Thoughts:  No  Homicidal Thoughts:  No  Memory:  Immediate;   Good  Judgement:  Good  Insight:  Good  Psychomotor Activity:  Normal  Concentration:  Concentration: Good and Attention Span: Good  Recall:  Good  Fund of Knowledge: Good  Language: Good  Akathisia:  No  Handed:  Right  AIMS (if indicated): not done  Assets:  Communication Skills Desire for Improvement  ADL's:  Intact  Cognition: WNL  Sleep:  Poor   Screenings: AIMS    Flowsheet Row Office Visit from 05/12/2017 in [facility] Office Visit from 07/29/2016 in [facility] Office Visit from 05[facility]06/2016 in [facility] Visit from 04/01/2016 [facility] Office Visit from 03/28/2015 in [facility]  AIMS Total Score 0 0 0 0 0      GAD-7    Flowsheet Row Office Visit from 12/30/2022 in [facility] Office Visit from 06/23/2022 in [facility] Office Visit from 06/16/2022 in [facility] Office Visit from 05/07/2022 in [facility] Office Visit from 03/23/2022 in [facility]  Total GAD-7 Score 14 5 6 19 8       Mini-Mental    Flowsheet Row Office Visit from 09/07/2019 in [facility]  Total Score (max 30 points ) 16      PHQ2-9    Flowsheet Row Office Visit from 12/30/2022 [facility] Clinical Support from 09/18/2022 in [facility] Office Visit from 06/23/2022 in [facility] Office Visit from 06/16/2022 in [facility] Office  Visit from 05/07/2022 in [facility]  PHQ-2 Total Score 2 6 2 2 2   PHQ-9 Total Score 13 9 9 9 17       Flowsheet Row Office Visit from 06/16/2022 in [facility] Office Visit from 05/07/2022 in [facility] Office Visit from 01/22/2022 in [facility]  C-SSRS RISK CATEGORY No Risk No Risk No Risk        Assessment and Plan:  ASHA LEVENDUSKY is a 52 y.o. year old female with a history of schizoaffective disorder,  sleep apnea (using CPAP machine), diabetes, hypertension, hyperlipidemia, GERD, who presents for follow up appointment for below.   1. Schizoaffective disorder, depressive type (HCC) 2. Obsessive-compulsive disorder, unspecified type 3. Anxiety state Acute stressors include:  Other stressors include: her husband being registered as a sex offender    History: Tx from Dr. Thea Gist. no psych admission. Originally on Invega 234 mg IM. Perphenazine 6 mg daily, viibryd 40 mg daily, amantadine 100 mg BID, gabapentin 600 mg TID. Unable to continue injection since May due to financial strain      Exam is notable for tearful affect during the visit.  Although there has been slight improvement in paranoia, she continues to experience delusion, and worsening in depressive symptoms in relation to this.  Will uptitrate the problem to optimize  treatment for schizoaffective disorder.  Discussed potential risk of akathisia, metabolic side effects and EPS.  Will continue current dose of Depakote to target mood dysregulation.  She was advised again to obtain labs for monitoring.  Will continue Viibryd to target depression, and gabapentin for anxiety.    4. High risk medication use She was advised again to obtain labs to safely continue Depakote.    Plan Increase Geodon 40 mg twice a day  (QTc 441 msec, HR 76, 10/2022) Continue Depakote 750  mg daily  Obtain labs (V[facility]) at [facility] Continue Viibryd 40 mg daily Continue gabapentin 600 mg 3 times a day Next appointment- 1/2 at 10 am for 30 mins IP - She [facility] - on metformin - will plan to recheck prolactin in the future   Past trials of medication: Abilify (hives), olanzapine (hives from 7.5 mg, quetiapine, risperidone (stopped working),    The patient demonstrates the following risk factors for suicide: Chronic risk factors for suicide include: psychiatric disorder of schizoaffective disorder. Acute risk factors for suicide include: unemployment. Protective factors for this patient include: positive social support, responsibility to others (children, family), hope for the future and religious beliefs against suicide. Considering these factors, the overall suicide risk at this point appears to be low. Patient is appropriate for outpatient follow up.  Although she has guns at home, she does not have access to them.      Collaboration of Care: Collaboration of Care: Other reviewed notes in Epic  Patient/Guardian was advised Release of Information must be obtained prior to any record release in order to collaborate their care with an outside provider. Patient/Guardian was advised if they have not already done so to contact the registration department to sign all necessary forms in order for [place] to release information regarding their care.   Consent: Patient/Guardian gives verbal consent for treatment and assignment of benefits for services provided during this visit. Patient/Guardian expressed understanding and agreed to proceed.    [name], MD 02/25/2023, 10:45 AM

## 2023-02-24 ENCOUNTER — Other Ambulatory Visit: Payer: Self-pay | Admitting: Psychiatry

## 2023-02-25 ENCOUNTER — Encounter: Payer: Self-pay | Admitting: Psychiatry

## 2023-02-25 ENCOUNTER — Ambulatory Visit: Payer: Medicare Other | Admitting: Psychiatry

## 2023-02-25 VITALS — BP 139/81 | HR 77 | Temp 98.0°F | Ht 62.0 in | Wt 245.4 lb

## 2023-02-25 DIAGNOSIS — F411 Generalized anxiety disorder: Secondary | ICD-10-CM | POA: Diagnosis not present

## 2023-02-25 DIAGNOSIS — F251 Schizoaffective disorder, depressive type: Secondary | ICD-10-CM | POA: Diagnosis not present

## 2023-02-25 DIAGNOSIS — F429 Obsessive-compulsive disorder, unspecified: Secondary | ICD-10-CM

## 2023-02-25 MED ORDER — ZIPRASIDONE HCL 40 MG PO CAPS: 40.0000 mg | ORAL_CAPSULE | Freq: Two times a day (BID) | ORAL | 1 refills | Status: DC

## 2023-02-25 MED ORDER — DIVALPROEX SODIUM ER 250 MG PO TB24
250.0000 mg | ORAL_TABLET | Freq: Every day | ORAL | 0 refills | Status: DC
Start: 2023-03-09 — End: 2023-05-26

## 2023-02-25 NOTE — Patient Instructions (Signed)
Increase Geodon 40 mg twice a day  Continue Depakote 750 mg daily  Obtain labs (VPA) at [facility] Continue Viibryd 40 mg daily Continue gabapentin 600 mg 3 times a day Next appointment- 1/2 at 10 am

## 2023-03-11 ENCOUNTER — Other Ambulatory Visit: Payer: Self-pay | Admitting: Internal Medicine

## 2023-03-11 MED ORDER — TRESIBA FLEXTOUCH 100 UNIT/ML ~~LOC~~ SOPN: PEN_INJECTOR | SUBCUTANEOUS | 0 refills | Status: DC

## 2023-03-11 NOTE — Telephone Encounter (Signed)
Medication Refill -  Most Recent Primary Care Visit:  Provider: Lorre Munroe  Department: [facility]  Visit Type: OFFICE VISIT  Date: 12/30/2022  Medication: insulin degludec (TRESIBA FLEXTOUCH) 100 UNIT/ML FlexTouch Pen   Has the patient contacted their pharmacy? Yes (Agent: If no, request that the patient contact the pharmacy for the refill. If patient does not wish to contact the pharmacy document the reason why and proceed with request.) (Agent: If yes, when and what did the pharmacy advise?)  Is this the correct pharmacy for this prescription? Yes If no, delete pharmacy and type the correct one.  This is the patient's preferred pharmacy:  [id number] - [place],  - [address] AT [facility] OF [address] [place] [address] Phone: [phone] Fax: [phone]   Has the prescription been filled recently? No  Is the patient out of the medication? Yes  Has the patient been seen for an appointment in the last year OR does the patient have an upcoming appointment? Yes  Can we respond through MyChart? Yes  Agent: Please be advised that Rx refills may take up to 3 business days. We ask that you follow-up with your pharmacy.

## 2023-03-11 NOTE — Telephone Encounter (Signed)
Requested Prescriptions  Pending Prescriptions Disp Refills   insulin degludec (TRESIBA FLEXTOUCH) 100 UNIT/ML FlexTouch Pen 15 mL 0    Sig: ADMINISTER 40 UNITS UNDER THE SKIN EVERY DAY     Endocrinology:  Diabetes - Insulins Passed - 03/11/2023  1:14 PM      Passed - HBA1C is between 0 and 7.9 and within 180 days    Hemoglobin A1C  Date Value Ref Range Status  12/30/2022 7.9 (A) 4.0 - 5.6 % Final   HB A1C (BAYER DCA - WAIVED)  Date Value Ref Range Status  10/22/2017 6.9 <7.0 % Final    Comment:                                          Diabetic Adult            <7.0                                       Healthy Adult        4.3 - 5.7                                                           (DCCT/NGSP) American Diabetes Association's Summary of Glycemic Recommendations for Adults with Diabetes: Hemoglobin A1c <7.0%. More stringent glycemic goals (A1c <6.0%) may further reduce complications at the cost of increased risk of hypoglycemia.    Hgb A1c MFr Bld  Date Value Ref Range Status  06/23/2022 8.8 (H) <5.7 % of total Hgb Final    Comment:    For someone without known diabetes, a hemoglobin A1c value of 6.5% or greater indicates that they may have  diabetes and this should be confirmed with a follow-up  test. . For someone with known diabetes, a value <7% indicates  that their diabetes is well controlled and a value  greater than or equal to 7% indicates suboptimal  control. A1c targets should be individualized based on  duration of diabetes, age, comorbid conditions, and  other considerations. . Currently, no consensus exists regarding use of hemoglobin A1c for diagnosis of diabetes for children. Verna Czech - Valid encounter within last 6 months    Recent Outpatient Visits           2 months ago Type 2 diabetes mellitus with diabetic polyneuropathy, with long-term current use of insulin [facility])   [place] [facility] [place], Salvadore Oxford, NP    7 months ago Dermatitis due to plants, including poison ivy, sumac, and oak   [place] [facility] Mecum, Erin E, PA-C   8 months ago Type 2 diabetes mellitus with diabetic polyneuropathy, with long-term current use of insulin [facility])   [facility] [facility] [place], Salvadore Oxford, NP   8 months ago Encounter for general adult medical examination with abnormal findings   [place] [facility] [place], Salvadore Oxford, NP   11 months ago Type 2 diabetes mellitus with diabetic polyneuropathy, with long-term current use of insulin (HCC)   [place]  [facility] [place], Salvadore Oxford, NP       Future Appointments             In 3 months Baity, Salvadore Oxford, NP [place] [facility] [place]

## 2023-03-15 ENCOUNTER — Other Ambulatory Visit: Payer: Self-pay | Admitting: Psychiatry

## 2023-03-29 NOTE — Progress Notes (Unsigned)
Virtual Visit via Video Note  I connected with Tabitha [name] on 03/30/23 at 10:30 AM EST by a video enabled telemedicine application and verified that I am speaking with the correct person using two identifiers.  Location: Patient: home Provider: office Persons participated in the visit- patient, provider    I discussed the limitations of evaluation and management by telemedicine and the availability of in person appointments. The patient expressed understanding and agreed to proceed.     I discussed the assessment and treatment plan with the patient. The patient was provided an opportunity to ask questions and all were answered. The patient agreed with the plan and demonstrated an understanding of the instructions.   The patient was advised to call back or seek an in-person evaluation if the symptoms worsen or if the condition fails to improve as anticipated.  I provided 15 minutes of non-face-to-face time during this encounter.   [name], MD    [facility] MD/PA/NP OP Progress Note  03/30/2023 10:56 AM Tabitha [name]  MRN:  [id number]  Chief Complaint:  Chief Complaint  Patient presents with   Follow-up   HPI:  This is a follow-up appointment for schizoaffective disorder, anxiety.  She states that she feels choir is against her.  She missed to go to church the other day as she did not feel like it due to this.  Although she has been able to get out of the room, she feels like her family is against her. She does not trust them as much.  She spent time, doing chores, watching TV.  She enjoyed a gathering at her sister-in-law's for Thanksgiving.  She has AH, although she denies CAH.  She feels depressed and anxious.  She has fair appetite.  She denies SI she sleeps only a few hours due to middle insomnia.  She uses CPAP machine regularly.Marland Kitchen although she does not perceive there has been any difference since uptitration of ziprasidone, she denies any side effects, and is willing to  try higher dose.   Visit Diagnosis:    ICD-10-CM   1. Schizoaffective disorder, depressive type (HCC)  F25.1     2. Obsessive-compulsive disorder, unspecified type  F42.9     3. Anxiety state  F41.1     4. High risk medication use  Z79.899 EKG 12-Lead      Past Psychiatric History: Please see initial evaluation for full details. I have reviewed the history. No updates at this time.     Past Medical History:  Past Medical History:  Diagnosis Date   Allergic rhinitis    Anxiety    Asthma    Bipolar disorder (HCC)    Depression    Diabetes mellitus without complication (HCC)    GERD (gastroesophageal reflux disease)    Hyperlipidemia    Hypertension    Mild sleep apnea    OCD (obsessive compulsive disorder)    Palpitations    Paranoia (HCC)    Schizoaffective disorder (HCC)    Sleep apnea     Past Surgical History:  Procedure Laterality Date   CESAREAN SECTION  2006/2008   CHOLECYSTECTOMY  2006    Family Psychiatric History: Please see initial evaluation for full details. I have reviewed the history. No updates at this time.    Family History:  Family History  Problem Relation Age of Onset   Lupus Mother    Diabetes Mother        pre diabetes   Diabetes Father    Hyperlipidemia  Father    Diabetes Brother    Breast cancer Maternal Grandmother    Depression Paternal Grandmother    Bipolar disorder Paternal Grandmother    Cancer - Colon Neg Hx     Social History:  Social History   Socioeconomic History   Marital status: Married    Spouse name: Helvi Srader   Number of children: 2   Years of education: 16   Highest education level: Bachelor's degree (e.g., BA, AB, BS)  Occupational History   Occupation: disability  Tobacco Use   Smoking status: Never   Smokeless tobacco: Never  Vaping Use   Vaping status: Never Used  Substance and Sexual Activity   Alcohol use: Not Currently   Drug use: Never   Sexual activity: Not Currently    Partners: Male     Birth control/protection: Surgical  Other Topics Concern   Not on file  Social History Narrative   Not on file   Social Determinants of Health   Financial Resource Strain: Medium Risk (12/30/2022)   Overall Financial Resource Strain (CARDIA)    Difficulty of Paying Living Expenses: Somewhat hard  Food Insecurity: Food Insecurity Present (12/30/2022)   Hunger Vital Sign    Worried About Running Out of Food in the Last Year: Sometimes true    Ran Out of Food in the Last Year: Sometimes true  Transportation Needs: Unmet Transportation Needs (12/30/2022)   PRAPARE - Administrator, Civil Service (Medical): Yes    Lack of Transportation (Non-Medical): No  Physical Activity: Inactive (12/30/2022)   Exercise Vital Sign    Days of Exercise per Week: 0 days    Minutes of Exercise per Session: 0 min  Stress: Stress Concern Present (12/30/2022)   [employer] of Occupational Health - Occupational Stress Questionnaire    Feeling of Stress : To some extent  Social Connections: Socially Integrated (12/30/2022)   Social Connection and Isolation Panel [NHANES]    Frequency of Communication with Friends and Family: Three times a week    Frequency of Social Gatherings with Friends and Family: Once a week    Attends Religious Services: More than 4 times per year    Active Member of [employer] or Organizations: Yes    Attends Engineer, structural: More than 4 times per year    Marital Status: Married    Allergies:  Allergies  Allergen Reactions   Abilify [Aripiprazole] Hives    Metabolic Disorder Labs: Lab Results  Component Value Date   HGBA1C 7.9 (A) 12/30/2022   MPG 206 06/23/2022   MPG 171 05/21/2021   Lab Results  Component Value Date   PROLACTIN 37.8 (H) 01/22/2022   PROLACTIN 84.8 (H) 06/01/2017   Lab Results  Component Value Date   CHOL 133 01/07/2023   TRIG 317 (H) 01/07/2023   HDL 48 (L) 01/07/2023   CHOLHDL 2.8 01/07/2023   VLDL 51 (H) 04/23/2017   LDLCALC  52 01/07/2023   LDLCALC  06/23/2022     Comment:     . LDL cholesterol not calculated. Triglyceride levels greater than 400 mg/dL invalidate calculated LDL results. . Reference range: <100 . Desirable range <100 mg/dL for primary prevention;   <70 mg/dL for patients with CHD or diabetic patients  with > or = 2 CHD risk factors. Marland Kitchen LDL-C is now calculated using the Martin-Hopkins  calculation, which is a validated novel method providing  better accuracy than the Friedewald equation in the  estimation of LDL-C.  Daphine Deutscher  SS et al. JAMA. 2725;366(44): 2061-2068  (http://education.Quest[employer].com/faq/FAQ164)    Lab Results  Component Value Date   TSH 1.026 01/22/2022   TSH 1.56 12/17/2021    Therapeutic Level Labs: No results found for: "LITHIUM" Lab Results  Component Value Date   VALPROATE 13 (L) 06/16/2022   VALPROATE 46 (L) 01/22/2022   No results found for: "CBMZ"  Current Medications: Current Outpatient Medications  Medication Sig Dispense Refill   ziprasidone (GEODON) 60 MG capsule Take 1 capsule (60 mg total) by mouth daily. 30 capsule 0   albuterol (VENTOLIN HFA) 108 (90 Base) MCG/ACT inhaler INHALE 2 PUFFS INTO THE LUNGS EVERY 6 HOURS AS NEEDED FOR WHEEZING OR SHORTNESS OF BREATH 25.5 g 3   atorvastatin (LIPITOR) 40 MG tablet TAKE 1 TABLET(40 MG) BY MOUTH DAILY 90 tablet 3   divalproex (DEPAKOTE ER) 250 MG 24 hr tablet Take 1 tablet (250 mg total) by mouth daily. Take along with 500 mg tab. 90 tablet 0   divalproex (DEPAKOTE ER) 500 MG 24 hr tablet Take 1 tablet (500 mg total) by mouth daily. Take along with 250 mg p.o. daily-total of 750 mg p.o. daily. 90 tablet 0   EPINEPHrine 0.3 mg/0.3 mL IJ SOAJ injection Inject 0.3 mg into the muscle as needed for anaphylaxis. 1 each 0   ezetimibe (ZETIA) 10 MG tablet TAKE 1 TABLET(10 MG) BY MOUTH DAILY 90 tablet 0   fluticasone (FLOVENT HFA) 110 MCG/ACT inhaler Inhale 1 puff into the lungs 2 (two) times daily. 1 Inhaler 11    gabapentin (NEURONTIN) 600 MG tablet Take 1 tablet (600 mg total) by mouth 3 (three) times daily. 270 tablet 0   glipiZIDE (GLUCOTROL) 10 MG tablet TAKE 1 TABLET(10 MG) BY MOUTH TWICE DAILY BEFORE A MEAL 180 tablet 1   glucose blood (ACCU-CHEK GUIDE) test strip USE TO TEST TWICE DAILY AS DIRECTED 200 strip 0   ibuprofen (ADVIL) 200 MG tablet Take 200 mg by mouth every 6 (six) hours as needed.     insulin degludec (TRESIBA FLEXTOUCH) 100 UNIT/ML FlexTouch Pen ADMINISTER 40 UNITS UNDER THE SKIN EVERY DAY 15 mL 0   Insulin Pen Needle (BD PEN NEEDLE NANO U/F) 32G X 4 MM MISC 1 each by Does not apply route daily. 100 each 2   MELATONIN PO Take 5 mg by mouth at bedtime as needed.     metFORMIN (GLUCOPHAGE) 500 MG tablet TAKE 2 TABLETS(1000 MG) BY MOUTH TWICE DAILY WITH A MEAL 120 tablet 0   omeprazole (PRILOSEC) 10 MG capsule Take 1 capsule (10 mg total) by mouth daily. 90 capsule 1   Vilazodone HCl (VIIBRYD) 40 MG TABS Take 1 tablet (40 mg total) by mouth daily. 90 tablet 1   ziprasidone (GEODON) 40 MG capsule Take 1 capsule (40 mg total) by mouth 2 (two) times daily with a meal. (Patient taking differently: Take 40 mg by mouth every evening.) 60 capsule 1   No current facility-administered medications for this visit.     Musculoskeletal: Strength & Muscle Tone:  N/A Gait & Station:  N/A Patient leans: N/A  Psychiatric Specialty Exam: Review of Systems  Psychiatric/Behavioral:  Positive for decreased concentration, dysphoric mood, hallucinations and sleep disturbance. Negative for agitation, behavioral problems, confusion, self-injury and suicidal ideas. The patient is nervous/anxious. The patient is not hyperactive.   All other systems reviewed and are negative.   There were no vitals taken for this visit.There is no height or weight on file to calculate BMI.  General Appearance:  Well Groomed  Eye Contact:  Good  Speech:  Clear and Coherent  Volume:  Normal  Mood:  Depressed  Affect:   Appropriate, Congruent, and calm  Thought Process:  Coherent  Orientation:  Full (Time, Place, and Person)  Thought Content: Logical   Suicidal Thoughts:  No  Homicidal Thoughts:  No  Memory:  Immediate;   Good  Judgement:  Good  Insight:  Good  Psychomotor Activity:  Normal  Concentration:  Concentration: Good and Attention Span: Good  Recall:  Good  Fund of Knowledge: Good  Language: Good  Akathisia:  No  Handed:  Right  AIMS (if indicated): not done  Assets:  Communication Skills Desire for Improvement  ADL's:  Intact  Cognition: WNL  Sleep:  Poor   Screenings: AIMS    Flowsheet Row Office Visit from 05/12/2017 in [facility] Visit from 07/29/2016 in [facility] Office Visit from 05/06/2016 in [facility] Office Visit from 04/01/2016 in [facility] Office Visit from 03/28/2015 in [facility]  AIMS Total Score 0 0 0 0 0      GAD-7    Flowsheet Row Office Visit from 12/30/2022 in [facility] Office Visit from 06/23/2022 in [facility] Office Visit from 06/16/2022 in [facility] Office Visit from 05/07/2022 in [facility] Office Visit from 03/23/2022 in [facility]  Total GAD-7 Score 14 5 6 19 8       Mini-Mental    Flowsheet Row Office Visit from 09/07/2019 in [facility]  Total Score (max 30 points ) 16      PHQ2-9    Flowsheet Row Office Visit from 12/30/2022 in [facility] Clinical Support from 09/18/2022 in [facility] Office Visit from 06/23/2022 in [facility] Office Visit from 06/16/2022 in [facility] Office Visit from 05/07/2022 in [facility]  PHQ-2 Total Score 2 6 2 2 2   PHQ-9 Total Score 13 9 9 9 17       Flowsheet Row Office Visit from 06/16/2022 in [facility] Office Visit from 05/07/2022 in [facility] Office Visit from 01/22/2022 in [facility]  C-SSRS RISK CATEGORY No Risk No Risk No Risk        Assessment and Plan:  Tabitha [name] is a 52 y.o. year old female with a history of schizoaffective disorder,  sleep apnea (using CPAP machine), diabetes, hypertension, hyperlipidemia, GERD, who presents for follow up appointment for below.   1. Schizoaffective disorder, depressive type (HCC) 2. Obsessive-compulsive disorder, unspecified type 3. Anxiety state Acute stressors include:  Other stressors include: her husband being registered as a sex offender    History: Tx from Dr. Thea Gist. no psych admission. Originally on Invega 234 mg IM. Perphenazine 6 mg daily, viibryd 40 mg daily, amantadine 100 mg BID, gabapentin 600 mg TID. Unable to continue injection since May due to financial strain      Exam is notable for calm affect, although she continues to experience paranoia, delusion and depressive symptoms.  Will uptitrate ziprasidone to optimize treatment for schizoaffective disorder.  Discussed potential risk of akathisia, metabolic  side effects and EPS.  Will obtain EKG to monitor QTc prolongation.  Will continue current dose of Depakote for mood dysregulation.  She was advised again to obtain labs for monitoring.  Will continue Viibryd to target depression and gabapentin for anxiety.   4. High risk medication use Obtain lab and EKG.    Plan Increase Geodon 60 mg in AM, 40 mg in PM  (QTc 441 msec, HR 76, 10/2022) Obtain EKG - please call [phone] to make an appointment   Continue Depakote 750 mg daily  Obtain labs ([facility]PA) at [facility] Continue Viibryd 40 mg daily Continue gabapentin 600 mg 3 times a day Next appointment- 1/2 at 10 am for 30 mins IP - She [facility] - on metformin - will plan to recheck prolactin in the future   Past trials of medication: Abilify (hives), olanzapine (hives from 7.5 mg, quetiapine, risperidone (stopped working),    The patient demonstrates the following risk factors for suicide: Chronic risk factors for suicide include: psychiatric disorder of schizoaffective disorder. Acute risk factors for suicide include: unemployment. Protective factors for this patient include: positive social support, responsibility to others (children, family), hope for the future and religious beliefs against suicide. Considering these factors, the overall suicide risk at this point appears to be low. Patient is appropriate for outpatient follow up.  Although she has guns at home, she does not have access to them.      Collaboration of Care: Collaboration of Care: Other reviewed notes in Epic  Patient/Guardian was advised Release of Information must be obtained prior to any record release in order to collaborate their care with an outside provider. Patient/Guardian was advised if they have not already done so to contact the registration department to sign all necessary forms in order for [place] to release information regarding their care.   Consent: Patient/Guardian gives verbal consent for treatment and assignment of benefits for services provided during this visit. Patient/Guardian expressed understanding and agreed to proceed.    [name], MD 03/30/2023, 10:56 AM

## 2023-03-30 ENCOUNTER — Encounter: Payer: Self-pay | Admitting: Psychiatry

## 2023-03-30 ENCOUNTER — Telehealth (INDEPENDENT_AMBULATORY_CARE_PROVIDER_SITE_OTHER): Payer: Medicare Other | Admitting: Psychiatry

## 2023-03-30 DIAGNOSIS — F411 Generalized anxiety disorder: Secondary | ICD-10-CM | POA: Diagnosis not present

## 2023-03-30 DIAGNOSIS — F251 Schizoaffective disorder, depressive type: Secondary | ICD-10-CM | POA: Diagnosis not present

## 2023-03-30 DIAGNOSIS — Z79899 Other long term (current) drug therapy: Secondary | ICD-10-CM

## 2023-03-30 DIAGNOSIS — F429 Obsessive-compulsive disorder, unspecified: Secondary | ICD-10-CM

## 2023-03-30 MED ORDER — ZIPRASIDONE HCL 60 MG PO CAPS: 60.0000 mg | ORAL_CAPSULE | Freq: Every day | ORAL | 0 refills | Status: DC

## 2023-04-13 ENCOUNTER — Other Ambulatory Visit: Payer: Self-pay | Admitting: Internal Medicine

## 2023-04-13 NOTE — Telephone Encounter (Signed)
Requested by interface surescripts. Future visit in 2 months.  Requested Prescriptions  Pending Prescriptions Disp Refills   TRESIBA FLEXTOUCH 100 UNIT/ML FlexTouch Pen [Pharmacy Med Name: TRESIBA FLEXTOUCH PEN ([id number]] 15 mL 0    Sig: ADMINISTER 40 UNITS UNDER THE SKIN EVERY DAY     Endocrinology:  Diabetes - Insulins Passed - 04/13/2023  3:45 PM      Passed - HBA1C is between 0 and 7.9 and within 180 days    Hemoglobin A1C  Date Value Ref Range Status  12/30/2022 7.9 (A) 4.0 - 5.6 % Final   HB A1C (BAYER DCA - WAIVED)  Date Value Ref Range Status  10/22/2017 6.9 <7.0 % Final    Comment:                                          Diabetic Adult            <7.0                                       Healthy Adult        4.3 - 5.7                                                           (DCCT/NGSP) American Diabetes Association's Summary of Glycemic Recommendations for Adults with Diabetes: Hemoglobin A1c <7.0%. More stringent glycemic goals (A1c <6.0%) may further reduce complications at the cost of increased risk of hypoglycemia.    Hgb A1c MFr Bld  Date Value Ref Range Status  06/23/2022 8.8 (H) <5.7 % of total Hgb Final    Comment:    For someone without known diabetes, a hemoglobin A1c value of 6.5% or greater indicates that they may have  diabetes and this should be confirmed with a follow-up  test. . For someone with known diabetes, a value <7% indicates  that their diabetes is well controlled and a value  greater than or equal to 7% indicates suboptimal  control. A1c targets should be individualized based on  duration of diabetes, age, comorbid conditions, and  other considerations. . Currently, no consensus exists regarding use of hemoglobin A1c for diagnosis of diabetes for children. Verna Czech - Valid encounter within last 6 months    Recent Outpatient Visits           3 months ago Type 2 diabetes mellitus with diabetic polyneuropathy, with  long-term current use of insulin [facility])   [place] [facility] [place], Salvadore Oxford, NP   8 months ago Dermatitis due to plants, including poison ivy, sumac, and oak   [place] [facility] Mecum, Oswaldo Conroy, PA-C   9 months ago Type 2 diabetes mellitus with diabetic polyneuropathy, with long-term current use of insulin [facility])   [place] [facility] [place], Salvadore Oxford, NP   9 months ago Encounter for general adult medical examination with abnormal findings    [facility] [place], Salvadore Oxford, NP   1 year ago Type 2 diabetes  mellitus with diabetic polyneuropathy, with long-term current use of insulin [facility])   [place] [facility] [place], Salvadore Oxford, NP       Future Appointments             In 2 months Baity, Salvadore Oxford, NP [facility], [facility]

## 2023-04-14 ENCOUNTER — Other Ambulatory Visit: Payer: Self-pay | Admitting: Internal Medicine

## 2023-04-14 DIAGNOSIS — K219 Gastro-esophageal reflux disease without esophagitis: Secondary | ICD-10-CM

## 2023-04-14 NOTE — Telephone Encounter (Signed)
Requested Prescriptions  Pending Prescriptions Disp Refills   omeprazole (PRILOSEC) 10 MG capsule [Pharmacy Med Name: OMEPRAZOLE 10MG  CAPSULES] 90 capsule 1    Sig: TAKE 1 CAPSULE(10 MG) BY MOUTH DAILY     Gastroenterology: Proton Pump Inhibitors Passed - 04/14/2023 12:00 PM      Passed - Valid encounter within last 12 months    Recent Outpatient Visits           3 months ago Type 2 diabetes mellitus with diabetic polyneuropathy, with long-term current use of insulin [facility])   [place], Salvadore Oxford, NP   8 months ago Dermatitis due to plants, including poison ivy, sumac, and oak   [place] [facility] Mecum, Erin E, PA-C   9 months ago Type 2 diabetes mellitus with diabetic polyneuropathy, with long-term current use of insulin [facility])   [place] [facility] [place], Salvadore Oxford, NP   9 months ago Encounter for general adult medical examination with abnormal findings   [place] [place], [place] W, NP   1 year ago Type 2 diabetes mellitus with diabetic polyneuropathy, with long-term current use of insulin [facility])   [place] [facility], Salvadore Oxford, NP       Future Appointments             In 2 months Baity, Salvadore Oxford, NP [place] [facility], [facility]

## 2023-04-22 ENCOUNTER — Telehealth: Payer: Self-pay | Admitting: *Deleted

## 2023-04-22 ENCOUNTER — Ambulatory Visit: Payer: Self-pay | Admitting: *Deleted

## 2023-04-22 DIAGNOSIS — Z20822 Contact with and (suspected) exposure to covid-19: Secondary | ICD-10-CM | POA: Diagnosis not present

## 2023-04-22 DIAGNOSIS — R059 Cough, unspecified: Secondary | ICD-10-CM | POA: Diagnosis not present

## 2023-04-22 DIAGNOSIS — B9789 Other viral agents as the cause of diseases classified elsewhere: Secondary | ICD-10-CM | POA: Diagnosis not present

## 2023-04-22 DIAGNOSIS — J329 Chronic sinusitis, unspecified: Secondary | ICD-10-CM | POA: Diagnosis not present

## 2023-04-22 NOTE — Telephone Encounter (Signed)
Left message for patient. Advised to go to Urgent care

## 2023-04-22 NOTE — Telephone Encounter (Signed)
Reviewed note, advised UC,unsure pt will do so.

## 2023-04-22 NOTE — Progress Notes (Deleted)
BH MD/PA/NP OP Progress Note  04/22/2023 3:29 PM Tabitha [name]  MRN:  [id number]  Chief Complaint: No chief complaint on file.  HPI: *** Visit Diagnosis: No diagnosis found.  Past Psychiatric History: Please see initial evaluation for full details. I have reviewed the history. No updates at this time.     Past Medical History:  Past Medical History:  Diagnosis Date   Allergic rhinitis    Anxiety    Asthma    Bipolar disorder (HCC)    Depression    Diabetes mellitus without complication (HCC)    GERD (gastroesophageal reflux disease)    Hyperlipidemia    Hypertension    Mild sleep apnea    OCD (obsessive compulsive disorder)    Palpitations    Paranoia (HCC)    Schizoaffective disorder (HCC)    Sleep apnea     Past Surgical History:  Procedure Laterality Date   CESAREAN SECTION  2006/2008   CHOLECYSTECTOMY  2006    Family Psychiatric History: Please see initial evaluation for full details. I have reviewed the history. No updates at this time.     Family History:  Family History  Problem Relation Age of Onset   Lupus Mother    Diabetes Mother        pre diabetes   Diabetes Father    Hyperlipidemia Father    Diabetes Brother    Breast cancer Maternal Grandmother    Depression Paternal Grandmother    Bipolar disorder Paternal Grandmother    Cancer - Colon Neg Hx     Social History:  Social History   Socioeconomic History   Marital status: Married    Spouse name: Tabitha [name]   Number of children: 2   Years of education: 16   Highest education level: Bachelor's degree (e.g., BA, AB, BS)  Occupational History   Occupation: disability  Tobacco Use   Smoking status: Never   Smokeless tobacco: Never  Vaping Use   Vaping status: Never Used  Substance and Sexual Activity   Alcohol use: Not Currently   Drug use: Never   Sexual activity: Not Currently    Partners: Male    Birth control/protection: Surgical  Other Topics Concern   Not on file   Social History Narrative   Not on file   Social Drivers of Health   Financial Resource Strain: Medium Risk (12/30/2022)   Overall Financial Resource Strain (CARDIA)    Difficulty of Paying Living Expenses: Somewhat hard  Food Insecurity: Food Insecurity Present (12/30/2022)   Hunger Vital Sign    Worried About Running Out of Food in the Last Year: Sometimes true    Ran Out of Food in the Last Year: Sometimes true  Transportation Needs: Unmet Transportation Needs (12/30/2022)   PRAPARE - Administrator, Civil Service (Medical): Yes    Lack of Transportation (Non-Medical): No  Physical Activity: Inactive (12/30/2022)   Exercise Vital Sign    Days of Exercise per Week: 0 days    Minutes of Exercise per Session: 0 min  Stress: Stress Concern Present (12/30/2022)   [employer] of Occupational Health - Occupational Stress Questionnaire    Feeling of Stress : To some extent  Social Connections: Socially Integrated (12/30/2022)   Social Connection and Isolation Panel [NHANES]    Frequency of Communication with Friends and Family: Three times a week    Frequency of Social Gatherings with Friends and Family: Once a week    Attends Religious Services: More than  4 times per year    Active Member of Clubs or Organizations: Yes    Attends Banker Meetings: More than 4 times per year    Marital Status: Married    Allergies:  Allergies  Allergen Reactions   Abilify [Aripiprazole] Hives    Metabolic Disorder Labs: Lab Results  Component Value Date   HGBA1C 7.9 (A) 12/30/2022   MPG 206 06/23/2022   MPG 171 05/21/2021   Lab Results  Component Value Date   PROLACTIN 37.8 (H) 01/22/2022   PROLACTIN 84.8 (H) 06/01/2017   Lab Results  Component Value Date   CHOL 133 01/07/2023   TRIG 317 (H) 01/07/2023   HDL 48 (L) 01/07/2023   CHOLHDL 2.8 01/07/2023   VLDL 51 (H) 04/23/2017   LDLCALC 52 01/07/2023   LDLCALC  06/23/2022     Comment:     . LDL cholesterol not  calculated. Triglyceride levels greater than 400 mg/dL invalidate calculated LDL results. . Reference range: <100 . Desirable range <100 mg/dL for primary prevention;   <70 mg/dL for patients with CHD or diabetic patients  with > or = 2 CHD risk factors. Marland Kitchen LDL-C is now calculated using the Martin-Hopkins  calculation, which is a validated novel method providing  better accuracy than the Friedewald equation in the  estimation of LDL-C.  Horald Pollen et al. Lenox Ahr. 2956;213(08): 2061-2068  (http://education.[employer].com/faq/FAQ164)    Lab Results  Component Value Date   TSH 1.026 01/22/2022   TSH 1.56 12/17/2021    Therapeutic Level Labs: No results found for: "LITHIUM" Lab Results  Component Value Date   VALPROATE 13 (L) 06/16/2022   VALPROATE 46 (L) 01/22/2022   No results found for: "CBMZ"  Current Medications: Current Outpatient Medications  Medication Sig Dispense Refill   albuterol (VENTOLIN HFA) 108 (90 Base) MCG/ACT inhaler INHALE 2 PUFFS INTO THE LUNGS EVERY 6 HOURS AS NEEDED FOR WHEEZING OR SHORTNESS OF BREATH 25.5 g 3   atorvastatin (LIPITOR) 40 MG tablet TAKE 1 TABLET(40 MG) BY MOUTH DAILY 90 tablet 3   divalproex (DEPAKOTE ER) 250 MG 24 hr tablet Take 1 tablet (250 mg total) by mouth daily. Take along with 500 mg tab. 90 tablet 0   divalproex (DEPAKOTE ER) 500 MG 24 hr tablet Take 1 tablet (500 mg total) by mouth daily. Take along with 250 mg p.o. daily-total of 750 mg p.o. daily. 90 tablet 0   EPINEPHrine 0.3 mg/0.3 mL IJ SOAJ injection Inject 0.3 mg into the muscle as needed for anaphylaxis. 1 each 0   ezetimibe (ZETIA) 10 MG tablet TAKE 1 TABLET(10 MG) BY MOUTH DAILY 90 tablet 0   fluticasone (FLOVENT HFA) 110 MCG/ACT inhaler Inhale 1 puff into the lungs 2 (two) times daily. 1 Inhaler 11   gabapentin (NEURONTIN) 600 MG tablet Take 1 tablet (600 mg total) by mouth 3 (three) times daily. 270 tablet 0   glipiZIDE (GLUCOTROL) 10 MG tablet TAKE 1 TABLET(10 MG)  BY MOUTH TWICE DAILY BEFORE A MEAL 180 tablet 1   glucose blood (ACCU-CHEK GUIDE) test strip USE TO TEST TWICE DAILY AS DIRECTED 200 strip 0   ibuprofen (ADVIL) 200 MG tablet Take 200 mg by mouth every 6 (six) hours as needed.     Insulin Pen Needle (BD PEN NEEDLE NANO U/F) 32G X 4 MM MISC 1 each by Does not apply route daily. 100 each 2   MELATONIN PO Take 5 mg by mouth at bedtime as needed.     metFORMIN (  GLUCOPHAGE) 500 MG tablet TAKE 2 TABLETS(1000 MG) BY MOUTH TWICE DAILY WITH A MEAL 120 tablet 0   omeprazole (PRILOSEC) 10 MG capsule TAKE 1 CAPSULE(10 MG) BY MOUTH DAILY 90 capsule 1   TRESIBA FLEXTOUCH 100 UNIT/ML FlexTouch Pen ADMINISTER 40 UNITS UNDER THE SKIN EVERY DAY 15 mL 0   Vilazodone HCl (VIIBRYD) 40 MG TABS Take 1 tablet (40 mg total) by mouth daily. 90 tablet 1   ziprasidone (GEODON) 40 MG capsule Take 1 capsule (40 mg total) by mouth 2 (two) times daily with a meal. (Patient taking differently: Take 40 mg by mouth every evening.) 60 capsule 1   ziprasidone (GEODON) 60 MG capsule Take 1 capsule (60 mg total) by mouth daily. 30 capsule 0   No current facility-administered medications for this visit.     Musculoskeletal: Strength & Muscle Tone: within normal limits Gait & Station: normal Patient leans: N/A  Psychiatric Specialty Exam: Review of Systems  There were no vitals taken for this visit.There is no height or weight on file to calculate BMI.  General Appearance: {Appearance:22683}  Eye Contact:  {BHH EYE CONTACT:22684}  Speech:  Clear and Coherent  Volume:  Normal  Mood:  {BHH MOOD:22306}  Affect:  {Affect (PAA):22687}  Thought Process:  Coherent  Orientation:  Full (Time, Place, and Person)  Thought Content: Logical   Suicidal Thoughts:  {ST/HT (PAA):22692}  Homicidal Thoughts:  {ST/HT (PAA):22692}  Memory:  Immediate;   Good  Judgement:  {Judgement (PAA):22694}  Insight:  {Insight (PAA):22695}  Psychomotor Activity:  Normal  Concentration:  Concentration:  Good and Attention Span: Good  Recall:  Good  Fund of Knowledge: Good  Language: Good  Akathisia:  No  Handed:  Right  AIMS (if indicated): not done  Assets:  Communication Skills Desire for Improvement  ADL's:  Intact  Cognition: WNL  Sleep:  {BHH GOOD/FAIR/POOR:22877}   Screenings: Geneticist, molecular Office Visit from 05/12/2017 in [facility] Office Visit from 07/29/2016 in [facility] Office Visit from 05/06/2016 in [facility] Office Visit from 04/01/2016 in [facility] Office Visit from 03/28/2015 in [facility]  AIMS Total Score 0 0 0 0 0      GAD-7    Flowsheet Row Office Visit from 12/30/2022 in [facility] Office Visit from 06/23/2022 in [facility] Office Visit from 06/16/2022 in [facility] Office Visit from 05/07/2022 in [facility] Office Visit from 03/23/2022 in [facility]  Total GAD-7 Score 14 5 6 19 8       Mini-Mental    Flowsheet Row Office Visit from 09/07/2019 in [facility]  Total Score (max 30 points ) 16      PHQ2-9    Flowsheet Row Office Visit from 12/30/2022 in [facility] Clinical Support from 09/18/2022 in [facility] Office Visit from 06/23/2022 in [facility] Office Visit from 06/16/2022 in [facility] Office Visit from 05/07/2022 in [facility]  PHQ-2 Total Score 2 6 2 2 2   PHQ-9 Total Score 13 9 9 9 17       Flowsheet Row Office Visit from 06/16/2022 in [facility]  Office Visit from 05/07/2022 in [facility] Office Visit from 01/22/2022 in [facility]  C-SSRS RISK CATEGORY No Risk No Risk No Risk        Assessment and Plan:  MERTICE DEMARIO is a 52 y.o. year old female with a history of schizoaffective disorder,  sleep apnea (using CPAP machine), diabetes, hypertension, hyperlipidemia, GERD, who presents for follow up appointment for below.    1. Schizoaffective disorder, depressive type (HCC) 2. Obsessive-compulsive disorder, unspecified type 3. Anxiety state Acute stressors include:  Other stressors include: her husband being registered as a sex offender    History: Tx from Dr. Thea Gist. no psych admission. Originally on Invega 234 mg IM. Perphenazine 6 mg daily, viibryd 40 mg daily, amantadine 100 mg BID, gabapentin 600 mg TID. Unable to continue injection since May due to financial strain      Exam is notable for calm affect, although she continues to experience paranoia, delusion and depressive symptoms.  Will uptitrate ziprasidone to optimize treatment for schizoaffective disorder.  Discussed potential risk of akathisia, metabolic side effects and EPS.  Will obtain EKG to monitor QTc prolongation.  Will continue current dose of Depakote for mood dysregulation.  She was advised again to obtain labs for monitoring.  Will continue Viibryd to target depression and gabapentin for anxiety.    4. High risk medication use Obtain lab and EKG.    Plan Increase Geodon 60 mg in AM, 40 mg in PM  (QTc 441 msec, HR 76, 10/2022) Obtain EKG - please call [phone] to make an appointment  Continue Depakote 750 mg daily  Obtain labs ([facility]) at [facility] Continue Viibryd 40 mg daily Continue gabapentin 600 mg 3 times a day Next appointment- 1/2 at 10 am for 30 mins IP - She [facility] P[facility] - on metformin - will plan to recheck prolactin in the future   Past  trials of medication: Abilify (hives), olanzapine (hives from 7.5 mg, quetiapine, risperidone (stopped working),    The patient demonstrates the following risk factors for suicide: Chronic risk factors for suicide include: psychiatric disorder of schizoaffective disorder. Acute risk factors for suicide include: unemployment. Protective factors for this patient include: positive social support, responsibility to others (children, family), hope for the future and religious beliefs against suicide. Considering these factors, the overall suicide risk at this point appears to be low. Patient is appropriate for outpatient follow up.  Although she has guns at home, she does not have access to them.        Collaboration of Care: Collaboration of Care: {BH OP Collaboration of Care:21014065}  Patient/Guardian was advised Release of Information must be obtained prior to any record release in order to collaborate their care with an outside provider. Patient/Guardian was advised if they have not already done so to contact the registration department to sign all necessary forms in order for [place] to release information regarding their care.   Consent: Patient/Guardian gives verbal consent for treatment and assignment of benefits for services provided during this visit. Patient/Guardian expressed understanding and agreed to proceed.    [name], MD 04/22/2023, 3:29 PM

## 2023-04-22 NOTE — Telephone Encounter (Signed)
  Chief Complaint: fever SOB at rest URI sx Symptoms: fever, SOB at rest, cough, nasal congestion  runny nose hard to breath through nose. Sore throat ear pain. Dizziness. Has been taking OTC dayquil, sinuex. Hx asthma. Has been using inhaler Frequency: 3 days Pertinent Negatives: Patient denies chest pain no difficulty breathing now  Disposition: [] ED /[x] Urgent Care (no appt availability in office) / [] Appointment(In office/virtual)/ []  [facility]/ [] Home Care/ [] Refused Recommended Disposition /[] [facility]/ []  Follow-up with PCP Additional Notes:   No available appt . Recommended UC , sty hydrated warm liquids. Please advise.       Reason for Disposition  [1] MILD difficulty breathing (e.g., minimal/no SOB at rest, SOB with walking, pulse <100) AND [2] NEW-onset or WORSE than normal  Answer Assessment - Initial Assessment Questions 1. RESPIRATORY STATUS: "Describe your breathing?" (e.g., wheezing, shortness of breath, unable to speak, severe coughing)      Shortness of breath , nasal congestion  2. ONSET: "When did this breathing problem begin?"      3 days ago  3. PATTERN "Does the difficult breathing come and go, or has it been constant since it started?"      Nasal congestion hard to breath through nose  4. SEVERITY: "How bad is your breathing?" (e.g., mild, moderate, severe)    - MILD: No SOB at rest, mild SOB with walking, speaks normally in sentences, can lie down, no retractions, pulse < 100.    - MODERATE: SOB at rest, SOB with minimal exertion and prefers to sit, cannot lie down flat, speaks in phrases, mild retractions, audible wheezing, pulse 100-120.    - SEVERE: Very SOB at rest, speaks in single words, struggling to breathe, sitting hunched forward, retractions, pulse > 120      SOB at rest 5. RECURRENT SYMPTOM: "Have you had difficulty breathing before?" If Yes, ask: "When was the last time?" and "What happened that time?"      Hx asthma 6.  CARDIAC HISTORY: "Do you have any history of heart disease?" (e.g., heart attack, angina, bypass surgery, angioplasty)      Na  7. LUNG HISTORY: "Do you have any history of lung disease?"  (e.g., pulmonary embolus, asthma, emphysema)     Hx asthma  8. CAUSE: "What do you think is causing the breathing problem?"      Hx asthma 9. OTHER SYMPTOMS: "Do you have any other symptoms? (e.g., dizziness, runny nose, cough, chest pain, fever)     Runny nose , nasal congestion, fever, SOB cough sore throat, ear pain 10. O2 SATURATION MONITOR:  "Do you use an oxygen saturation monitor (pulse oximeter) at home?" If Yes, ask: "What is your reading (oxygen level) today?" "What is your usual oxygen saturation reading?" (e.g., 95%)       na 11. PREGNANCY: "Is there any chance you are pregnant?" "When was your last menstrual period?"       na 12. TRAVEL: "Have you traveled out of the country in the last month?" (e.g., travel history, exposures)       na  Protocols used: Breathing Difficulty-A-AH

## 2023-04-28 NOTE — Telephone Encounter (Signed)
 Error

## 2023-04-29 ENCOUNTER — Ambulatory Visit: Payer: Medicare Other | Admitting: Psychiatry

## 2023-05-02 ENCOUNTER — Other Ambulatory Visit: Payer: Self-pay | Admitting: Psychiatry

## 2023-05-02 DIAGNOSIS — F251 Schizoaffective disorder, depressive type: Secondary | ICD-10-CM

## 2023-05-04 ENCOUNTER — Other Ambulatory Visit: Payer: Self-pay | Admitting: Psychiatry

## 2023-05-04 DIAGNOSIS — F251 Schizoaffective disorder, depressive type: Secondary | ICD-10-CM

## 2023-05-04 MED ORDER — GABAPENTIN 600 MG PO TABS: 600.0000 mg | ORAL_TABLET | Freq: Three times a day (TID) | ORAL | 0 refills | Status: DC

## 2023-05-06 ENCOUNTER — Encounter: Payer: Self-pay | Admitting: Internal Medicine

## 2023-05-06 ENCOUNTER — Ambulatory Visit (INDEPENDENT_AMBULATORY_CARE_PROVIDER_SITE_OTHER): Payer: Medicare Other | Admitting: Internal Medicine

## 2023-05-06 VITALS — BP 142/86 | Ht 62.0 in | Wt 240.0 lb

## 2023-05-06 DIAGNOSIS — J0100 Acute maxillary sinusitis, unspecified: Secondary | ICD-10-CM | POA: Diagnosis not present

## 2023-05-06 MED ORDER — AMOXICILLIN-POT CLAVULANATE 875-125 MG PO TABS
1.0000 | ORAL_TABLET | Freq: Two times a day (BID) | ORAL | 0 refills | Status: DC
Start: 2023-05-06 — End: 2023-09-24

## 2023-05-06 NOTE — Patient Instructions (Signed)

## 2023-05-06 NOTE — Progress Notes (Signed)
 Subjective:    Patient ID: Tabitha [name], female    DOB: 01-18-1971, 53 y.o.   MRN: [id number]  HPI  Discussed the use of AI scribe software for clinical note transcription with the patient, who gave verbal consent to proceed.  The patient, with a history of diabetes, has been experiencing symptoms of a respiratory illness for the past two weeks. The symptoms began with a headache, congestion, and coughing, accompanied by sinus pressure and a stuffy nose. The nasal congestion has since transitioned to a runny nose. The patient also reported ear pain and a sore throat, which has since resolved, but the cough persists.  The patient has been surrounded by sick individuals, including her children, and has experienced some shortness of breath and wheezing. Accompanying these symptoms are diarrhea and body aches. The patient sought care at an urgent care facility, where she was prescribed cough syrup, which has not been effective in alleviating the cough.  The patient also reported feeling unusually tired and lacking energy. She has been taking DayQuil for symptom management.      Review of Systems     Past Medical History:  Diagnosis Date   Allergic rhinitis    Anxiety    Asthma    Bipolar disorder (HCC)    Depression    Diabetes mellitus without complication (HCC)    GERD (gastroesophageal reflux disease)    Hyperlipidemia    Hypertension    Mild sleep apnea    OCD (obsessive compulsive disorder)    Palpitations    Paranoia (HCC)    Schizoaffective disorder (HCC)    Sleep apnea     Current Outpatient Medications  Medication Sig Dispense Refill   albuterol  (VENTOLIN  HFA) 108 (90 Base) MCG/ACT inhaler INHALE 2 PUFFS INTO THE LUNGS EVERY 6 HOURS AS NEEDED FOR WHEEZING OR SHORTNESS OF BREATH 25.5 g 3   atorvastatin  (LIPITOR) 40 MG tablet TAKE 1 TABLET(40 MG) BY MOUTH DAILY 90 tablet 3   divalproex  (DEPAKOTE  ER) 250 MG 24 hr tablet Take 1 tablet (250 mg total) by mouth daily.  Take along with 500 mg tab. 90 tablet 0   divalproex  (DEPAKOTE  ER) 500 MG 24 hr tablet Take 1 tablet (500 mg total) by mouth daily. Take along with 250 mg p.o. daily-total of 750 mg p.o. daily. 90 tablet 0   EPINEPHrine  0.3 mg/0.3 mL IJ SOAJ injection Inject 0.3 mg into the muscle as needed for anaphylaxis. 1 each 0   ezetimibe  (ZETIA ) 10 MG tablet TAKE 1 TABLET(10 MG) BY MOUTH DAILY 90 tablet 0   fluticasone  (FLOVENT  HFA) 110 MCG/ACT inhaler Inhale 1 puff into the lungs 2 (two) times daily. 1 Inhaler 11   [START ON 05/08/2023] gabapentin  (NEURONTIN ) 600 MG tablet Take 1 tablet (600 mg total) by mouth 3 (three) times daily. 270 tablet 0   glipiZIDE  (GLUCOTROL ) 10 MG tablet TAKE 1 TABLET(10 MG) BY MOUTH TWICE DAILY BEFORE A MEAL 180 tablet 1   glucose blood (ACCU-CHEK GUIDE) test strip USE TO TEST TWICE DAILY AS DIRECTED 200 strip 0   ibuprofen (ADVIL) 200 MG tablet Take 200 mg by mouth every 6 (six) hours as needed.     Insulin  Pen Needle (BD PEN NEEDLE NANO U/F) 32G X 4 MM MISC 1 each by Does not apply route daily. 100 each 2   MELATONIN PO Take 5 mg by mouth at bedtime as needed.     metFORMIN  (GLUCOPHAGE ) 500 MG tablet TAKE 2 TABLETS(1000 MG) BY MOUTH TWICE  DAILY WITH A MEAL 120 tablet 0   omeprazole  (PRILOSEC) 10 MG capsule TAKE 1 CAPSULE(10 MG) BY MOUTH DAILY 90 capsule 1   TRESIBA  FLEXTOUCH 100 UNIT/ML FlexTouch Pen ADMINISTER 40 UNITS UNDER THE SKIN EVERY DAY 15 mL 0   Vilazodone  HCl (VIIBRYD ) 40 MG TABS Take 1 tablet (40 mg total) by mouth daily. 90 tablet 1   ziprasidone  (GEODON ) 40 MG capsule Take 1 capsule (40 mg total) by mouth 2 (two) times daily with a meal. (Patient taking differently: Take 40 mg by mouth every evening.) 60 capsule 1   ziprasidone  (GEODON ) 60 MG capsule Take 1 capsule (60 mg total) by mouth daily. 90 capsule 0   No current facility-administered medications for this visit.    Allergies  Allergen Reactions   Abilify [Aripiprazole] Hives    Family History   Problem Relation Age of Onset   Lupus Mother    Diabetes Mother        pre diabetes   Diabetes Father    Hyperlipidemia Father    Diabetes Brother    Breast cancer Maternal Grandmother    Depression Paternal Grandmother    Bipolar disorder Paternal Grandmother    Cancer - Colon Neg Hx     Social History   Socioeconomic History   Marital status: Married    Spouse name: Javae Braaten   Number of children: 2   Years of education: 16   Highest education level: Bachelor's degree (e.g., BA, AB, BS)  Occupational History   Occupation: disability  Tobacco Use   Smoking status: Never   Smokeless tobacco: Never  Vaping Use   Vaping status: Never Used  Substance and Sexual Activity   Alcohol use: Not Currently   Drug use: Never   Sexual activity: Not Currently    Partners: Male    Birth control/protection: Surgical  Other Topics Concern   Not on file  Social History Narrative   Not on file   Social Drivers of Health   Financial Resource Strain: Medium Risk (12/30/2022)   Overall Financial Resource Strain (CARDIA)    Difficulty of Paying Living Expenses: Somewhat hard  Food Insecurity: Food Insecurity Present (12/30/2022)   Hunger Vital Sign    Worried About Running Out of Food in the Last Year: Sometimes true    Ran Out of Food in the Last Year: Sometimes true  Transportation Needs: Unmet Transportation Needs (12/30/2022)   PRAPARE - Administrator, Civil Service (Medical): Yes    Lack of Transportation (Non-Medical): No  Physical Activity: Inactive (12/30/2022)   Exercise Vital Sign    Days of Exercise per Week: 0 days    Minutes of Exercise per Session: 0 min  Stress: Stress Concern Present (12/30/2022)   [employer] of Occupational Health - Occupational Stress Questionnaire    Feeling of Stress : To some extent  Social Connections: Socially Integrated (12/30/2022)   Social Connection and Isolation Panel [NHANES]    Frequency of Communication with Friends and  Family: Three times a week    Frequency of Social Gatherings with Friends and Family: Once a week    Attends Religious Services: More than 4 times per year    Active Member of [employer] or Organizations: Yes    Attends Banker Meetings: More than 4 times per year    Marital Status: Married  Catering Manager Violence: Not At Risk (09/18/2022)   Humiliation, Afraid, Rape, and Kick questionnaire    Fear of Current or Ex-Partner:  No    Emotionally Abused: No    Physically Abused: No    Sexually Abused: No     Constitutional: Patient reports intermittent headaches.  Denies fever, malaise, fatigue, or abrupt weight changes.  HEENT: Patient reports sinus pain, runny nose,  nasal congestion, ear pain and sore throat.  Denies eye pain, eye redness, ringing in the ears, wax buildup, bloody nose. Respiratory: Patient reports cough, intermittent shortness of breath.  Denies difficulty breathing.   Cardiovascular: Denies chest pain, chest tightness, palpitations or swelling in the hands or feet.  Gastrointestinal: Patient reports intermittent diarrhea.  Denies abdominal pain, bloating, constipation, or blood in the stool.  GU: Denies urgency, frequency, pain with urination, burning sensation, blood in urine, odor or discharge. Musculoskeletal: Pt reports body aches. Denies decrease in range of motion, difficulty with gait, or joint pain and swelling.  Skin: Denies redness, rashes, lesions or ulcercations.  Neurological: Patient reports neuropathic pain.  Denies dizziness, difficulty with memory, difficulty with speech or problems with balance and coordination.  Psych: Patient has a history of depression.  Denies anxiety, SI/HI.  No other specific complaints in a complete review of systems (except as listed in HPI above).  Objective:   Physical Exam  BP (!) 142/86   Ht 5' 2 (1.575 m)   Wt 240 lb (108.9 kg)   BMI 43.90 kg/m   Wt Readings from Last 3 Encounters:  12/30/22 244 lb (110.7  kg)  09/18/22 232 lb (105.2 kg)  07/30/22 232 lb (105.2 kg)    General: Appears her stated age, appears unwell but in NAD. Skin: Warm, dry and intact. No rashes noted. HEENT: Head: normal shape and size, maxillary sinus tenderness noted; Eyes: sclera white, no icterus, conjunctiva pink, PERRLA and EOMs intact; Ears: Bilateral cerumen impaction; Nose: mucosa boggy and moist, turbinates swollen; Throat/Mouth: Teeth present, mucosa pink and moist, + PND, no exudate, lesions or ulcerations noted.  Neck: No adenopathy noted. Cardiovascular: Normal rate and rhythm. S1,S2 noted.  No murmur, rubs or gallops noted. No JVD or BLE edema. No carotid bruits noted. Pulmonary/Chest: Normal effort and positive vesicular breath sounds. No respiratory distress. No wheezes, rales or ronchi noted.  Neurological: Alert and oriented.  BMET    Component Value Date/Time   NA 132 (L) 01/07/2023 1058   NA 137 09/07/2019 1657   K 4.2 01/07/2023 1058   CL 96 (L) 01/07/2023 1058   CO2 26 01/07/2023 1058   GLUCOSE 281 (H) 01/07/2023 1058   BUN 12 01/07/2023 1058   BUN 14 09/07/2019 1657   CREATININE 0.72 01/07/2023 1058   CALCIUM  9.3 01/07/2023 1058   GFRNONAA 76 09/07/2019 1657   GFRAA 88 09/07/2019 1657    Lipid Panel     Component Value Date/Time   CHOL 133 01/07/2023 1058   CHOL 138 09/07/2019 1657   CHOL 176 04/23/2017 1341   TRIG 317 (H) 01/07/2023 1058   TRIG 253 (H) 04/23/2017 1341   HDL 48 (L) 01/07/2023 1058   HDL 55 09/07/2019 1657   CHOLHDL 2.8 01/07/2023 1058   VLDL 51 (H) 04/23/2017 1341   LDLCALC 52 01/07/2023 1058    CBC    Component Value Date/Time   WBC 6.3 01/07/2023 1058   RBC 4.14 01/07/2023 1058   HGB 12.4 01/07/2023 1058   HGB 13.3 04/28/2018 1604   HCT 36.9 01/07/2023 1058   HCT 39.6 04/28/2018 1604   PLT 146 01/07/2023 1058   PLT 201 04/28/2018 1604   MCV 89.1  01/07/2023 1058   MCV 86 04/28/2018 1604   MCH 30.0 01/07/2023 1058   MCHC 33.6 01/07/2023 1058   RDW  13.6 01/07/2023 1058   RDW 15.1 04/28/2018 1604   LYMPHSABS 2,690 01/07/2023 1058   LYMPHSABS 2.4 04/28/2018 1604   MONOABS 378 09/26/2015 1154   EOSABS 158 01/07/2023 1058   EOSABS 0.1 04/28/2018 1604   BASOSABS 32 01/07/2023 1058   BASOSABS 0.0 04/28/2018 1604    Hgb A1C Lab Results  Component Value Date   HGBA1C 7.9 (A) 12/30/2022            Assessment & Plan:    Assessment and Plan    Sinusitis Two-week history of headache, congestion, cough, and ear pain. Tenderness over sinuses on examination. Recent exposure to sick contacts. -Start Augmentin  875-125mg  twice daily for 10 days. -Use Flonase  twice daily to reduce swelling. -If no improvement by Monday, patient to notify the office.  Diarrhea Concurrent with sinusitis symptoms. -Advised to take Imodium as needed. -Advised that Augmentin  may exacerbate diarrhea.  Fatigue Likely secondary to sinusitis. -Expected to improve with resolution of sinusitis.   RTC in 2 months for your annual exam Angeline Laura, NP

## 2023-05-10 ENCOUNTER — Other Ambulatory Visit: Payer: Self-pay | Admitting: Internal Medicine

## 2023-05-11 NOTE — Telephone Encounter (Signed)
 Requested medications are due for refill today.  yes  Requested medications are on the active medications list.  yes  Last refill. 01/29/2023 #200 0 rf  Future visit scheduled.   yes  Notes to clinic.  Protocol did not attach.    Requested Prescriptions  Pending Prescriptions Disp Refills   ACCU-CHEK GUIDE TEST test strip [Pharmacy Med Name: ACCU-CHEK GUIDE TEST STRIPS 100S] 200 strip 0    Sig: USE TO TEST TWICE DAILY AS DIRECTED     There is no refill protocol information for this order

## 2023-05-14 ENCOUNTER — Other Ambulatory Visit: Payer: Self-pay | Admitting: Psychiatry

## 2023-05-14 DIAGNOSIS — F251 Schizoaffective disorder, depressive type: Secondary | ICD-10-CM

## 2023-05-19 ENCOUNTER — Encounter: Payer: Medicare Other | Admitting: Psychiatry

## 2023-05-19 NOTE — Progress Notes (Unsigned)
 This encounter was created in error - please disregard.

## 2023-05-21 ENCOUNTER — Other Ambulatory Visit: Payer: Self-pay | Admitting: Internal Medicine

## 2023-05-21 NOTE — Telephone Encounter (Signed)
Requested Prescriptions  Pending Prescriptions Disp Refills   TRESIBA FLEXTOUCH 100 UNIT/ML FlexTouch Pen [Pharmacy Med Name: TRESIBA FLEXTOUCH PEN (U-100)[id number]] 15 mL 0    Sig: ADMINISTER 40 UNITS UNDER THE SKIN EVERY DAY     Endocrinology:  Diabetes - Insulins Passed - 05/21/2023 12:04 PM      Passed - HBA1C is between 0 and 7.9 and within 180 days    Hemoglobin A1C  Date Value Ref Range Status  12/30/2022 7.9 (A) 4.0 - 5.6 % Final   HB A1C (BAYER DCA - WAIVED)  Date Value Ref Range Status  10/22/2017 6.9 <7.0 % Final    Comment:                                          Diabetic Adult            <7.0                                       Healthy Adult        4.3 - 5.7                                                           (DCCT/NGSP) American Diabetes Association's Summary of Glycemic Recommendations for Adults with Diabetes: Hemoglobin A1c <7.0%. More stringent glycemic goals (A1c <6.0%) may further reduce complications at the cost of increased risk of hypoglycemia.    Hgb A1c MFr Bld  Date Value Ref Range Status  06/23/2022 8.8 (H) <5.7 % of total Hgb Final    Comment:    For someone without known diabetes, a hemoglobin A1c value of 6.5% or greater indicates that they may have  diabetes and this should be confirmed with a follow-up  test. . For someone with known diabetes, a value <7% indicates  that their diabetes is well controlled and a value  greater than or equal to 7% indicates suboptimal  control. A1c targets should be individualized based on  duration of diabetes, age, comorbid conditions, and  other considerations. . Currently, no consensus exists regarding use of hemoglobin A1c for diagnosis of diabetes for children. Verna Czech - Valid encounter within last 6 months    Recent Outpatient Visits           2 weeks ago Acute non-recurrent maxillary sinusitis   [place] [facility] [place], [place] W, NP   4 months ago Type 2  diabetes mellitus with diabetic polyneuropathy, with long-term current use of insulin [facility])   [place] [facility] [place], Salvadore Oxford, NP   9 months ago Dermatitis due to plants, including poison ivy, sumac, and oak   [facility] M[place]um, Erin E, PA-C   10 months ago Type 2 diabetes mellitus with diabetic polyneuropathy, with long-term current use of insulin [facility])   [place] [facility] [place], Salvadore Oxford, NP   11 months ago Encounter for general adult medical examination with abnormal findings   [place] [facility] [place], [place]  W, NP       Future Appointments             In 1 month Baity, Salvadore Oxford, NP [facility]

## 2023-05-25 NOTE — Progress Notes (Unsigned)
Virtual Visit via Video Note  I connected with Tabitha [name] on 05/26/23 at  2:30 PM EST by a video enabled telemedicine application and verified that I am speaking with the correct person using two identifiers.  Location: Patient: home Provider: office Persons participated in the visit- patient, provider    I discussed the limitations of evaluation and management by telemedicine and the availability of in person appointments. The patient expressed understanding and agreed to proceed.    I discussed the assessment and treatment plan with the patient. The patient was provided an opportunity to ask questions and all were answered. The patient agreed with the plan and demonstrated an understanding of the instructions.   The patient was advised to call back or seek an in-person evaluation if the symptoms worsen or if the condition fails to improve as anticipated.    [name], MD    [facility] MD/PA/NP OP Progress Note  05/26/2023 2:59 PM Tabitha [name]  MRN:  [id number]  Chief Complaint:  Chief Complaint  Patient presents with   Follow-up   HPI:  This is a follow-up appointment for schizoaffective disorder, anxiety.  She states that she stopped going to choir as she had paranoia.  Paranoia has improved as long as she does not go there.  However, she continues to hear voices of her son calling her.  She denies CAH or VH.  She feels depressed.  She has obsessive thoughts about salvation, or others which she should not think about.  Although she may do check stoves, it is up to a few times per day.  She sleeps up to a few hours.  She denies SI.  She feels euphoria, which lasts only for a few hours.  She denies increased goal-directed activities.  She denies irritability or HI.  She agrees with the plan as outlined below.   Visit Diagnosis:    ICD-10-CM   1. Schizoaffective disorder, depressive type (HCC)  F25.1 divalproex (DEPAKOTE ER) 250 MG 24 hr tablet    2. Obsessive-compulsive  disorder, unspecified type  F42.9 Vilazodone HCl (VIIBRYD) 40 MG TABS    3. Anxiety state  F41.1     4. High risk medication use  Z79.899       Past Psychiatric History: Please see initial evaluation for full details. I have reviewed the history. No updates at this time.     Past Medical History:  Past Medical History:  Diagnosis Date   Allergic rhinitis    Anxiety    Asthma    Bipolar disorder (HCC)    Depression    Diabetes mellitus without complication (HCC)    GERD (gastroesophageal reflux disease)    Hyperlipidemia    Hypertension    Mild sleep apnea    OCD (obsessive compulsive disorder)    Palpitations    Paranoia (HCC)    Schizoaffective disorder (HCC)    Sleep apnea     Past Surgical History:  Procedure Laterality Date   CESAREAN SECTION  2006/2008   CHOLECYSTECTOMY  2006    Family Psychiatric History: Please see initial evaluation for full details. I have reviewed the history. No updates at this time.     Family History:  Family History  Problem Relation Age of Onset   Lupus Mother    Diabetes Mother        pre diabetes   Diabetes Father    Hyperlipidemia Father    Diabetes Brother    Breast cancer Maternal Grandmother  Depression Paternal Grandmother    Bipolar disorder Paternal Grandmother    Cancer - Colon Neg Hx     Social History:  Social History   Socioeconomic History   Marital status: Married    Spouse name: Sharhonda Atwood   Number of children: 2   Years of education: 16   Highest education level: Bachelor's degree (e.g., BA, AB, BS)  Occupational History   Occupation: disability  Tobacco Use   Smoking status: Never   Smokeless tobacco: Never  Vaping Use   Vaping status: Never Used  Substance and Sexual Activity   Alcohol use: Not Currently   Drug use: Never   Sexual activity: Not Currently    Partners: Male    Birth control/protection: Surgical  Other Topics Concern   Not on file  Social History Narrative   Not on file    Social Drivers of Health   Financial Resource Strain: Medium Risk (12/30/2022)   Overall Financial Resource Strain (CARDIA)    Difficulty of Paying Living Expenses: Somewhat hard  Food Insecurity: Food Insecurity Present (12/30/2022)   Hunger Vital Sign    Worried About Running Out of Food in the Last Year: Sometimes true    Ran Out of Food in the Last Year: Sometimes true  Transportation Needs: Unmet Transportation Needs (12/30/2022)   PRAPARE - Administrator, Civil Service (Medical): Yes    Lack of Transportation (Non-Medical): No  Physical Activity: Inactive (12/30/2022)   Exercise Vital Sign    Days of Exercise per Week: 0 days    Minutes of Exercise per Session: 0 min  Stress: Stress Concern Present (12/30/2022)   [employer] of Occupational Health - Occupational Stress Questionnaire    Feeling of Stress : To some extent  Social Connections: Socially Integrated (12/30/2022)   Social Connection and Isolation Panel [NHANES]    Frequency of Communication with Friends and Family: Three times a week    Frequency of Social Gatherings with Friends and Family: Once a week    Attends Religious Services: More than 4 times per year    Active Member of [employer] or Organizations: Yes    Attends Engineer, structural: More than 4 times per year    Marital Status: Married    Allergies:  Allergies  Allergen Reactions   Abilify [Aripiprazole] Hives    Metabolic Disorder Labs: Lab Results  Component Value Date   HGBA1C 7.9 (A) 12/30/2022   MPG 206 06/23/2022   MPG 171 05/21/2021   Lab Results  Component Value Date   PROLACTIN 37.8 (H) 01/22/2022   PROLACTIN 84.8 (H) 06/01/2017   Lab Results  Component Value Date   CHOL 133 01/07/2023   TRIG 317 (H) 01/07/2023   HDL 48 (L) 01/07/2023   CHOLHDL 2.8 01/07/2023   VLDL 51 (H) 04/23/2017   LDLCALC 52 01/07/2023   LDLCALC  06/23/2022     Comment:     . LDL cholesterol not calculated. Triglyceride levels greater  than 400 mg/dL invalidate calculated LDL results. . Reference range: <100 . Desirable range <100 mg/dL for primary prevention;   <70 mg/dL for patients with CHD or diabetic patients  with > or = 2 CHD risk factors. Marland Kitchen LDL-C is now calculated using the Martin-Hopkins  calculation, which is a validated novel method providing  better accuracy than the Friedewald equation in the  estimation of LDL-C.  Horald Pollen et al. Lenox Ahr. 1308;657(84): 2061-2068  (http://education.[employer].com/faq/FAQ164)    Lab Results  Component Value  Date   TSH 1.026 01/22/2022   TSH 1.56 12/17/2021    Therapeutic Level Labs: No results found for: "LITHIUM" Lab Results  Component Value Date   VALPROATE 13 (L) 06/16/2022   VALPROATE 46 (L) 01/22/2022   No results found for: "CBMZ"  Current Medications: Current Outpatient Medications  Medication Sig Dispense Refill   ACCU-CHEK GUIDE TEST test strip USE TO TEST TWICE DAILY AS DIRECTED 200 strip 0   albuterol (VENTOLIN HFA) 108 (90 Base) MCG/ACT inhaler INHALE 2 PUFFS INTO THE LUNGS EVERY 6 HOURS AS NEEDED FOR WHEEZING OR SHORTNESS OF BREATH 25.5 g 3   amoxicillin-clavulanate (AUGMENTIN) 875-125 MG tablet Take 1 tablet by mouth 2 (two) times daily. 20 tablet 0   atorvastatin (LIPITOR) 40 MG tablet TAKE 1 TABLET(40 MG) BY MOUTH DAILY 90 tablet 3   [START ON 06/07/2023] divalproex (DEPAKOTE ER) 250 MG 24 hr tablet Take 1 tablet (250 mg total) by mouth daily. Take along with 500 mg tab. 90 tablet 0   divalproex (DEPAKOTE ER) 500 MG 24 hr tablet Take 1 tablet (500 mg total) by mouth daily. Take along with 250 mg p.o. daily-total of 750 mg p.o. daily. 90 tablet 0   EPINEPHrine 0.3 mg/0.3 mL IJ SOAJ injection Inject 0.3 mg into the muscle as needed for anaphylaxis. 1 each 0   ezetimibe (ZETIA) 10 MG tablet TAKE 1 TABLET(10 MG) BY MOUTH DAILY 90 tablet 0   fluticasone (FLOVENT HFA) 110 MCG/ACT inhaler Inhale 1 puff into the lungs 2 (two) times daily. 1 Inhaler  11   gabapentin (NEURONTIN) 600 MG tablet Take 1 tablet (600 mg total) by mouth 3 (three) times daily. 270 tablet 0   glipiZIDE (GLUCOTROL) 10 MG tablet TAKE 1 TABLET(10 MG) BY MOUTH TWICE DAILY BEFORE A MEAL 180 tablet 1   ibuprofen (ADVIL) 200 MG tablet Take 200 mg by mouth every 6 (six) hours as needed.     Insulin Pen Needle (BD PEN NEEDLE NANO U/F) 32G X 4 MM MISC 1 each by Does not apply route daily. 100 each 2   MELATONIN PO Take 5 mg by mouth at bedtime as needed.     metFORMIN (GLUCOPHAGE) 500 MG tablet TAKE 2 TABLETS(1000 MG) BY MOUTH TWICE DAILY WITH A MEAL 120 tablet 0   omeprazole (PRILOSEC) 10 MG capsule TAKE 1 CAPSULE(10 MG) BY MOUTH DAILY 90 capsule 1   TRESIBA FLEXTOUCH 100 UNIT/ML FlexTouch Pen ADMINISTER 40 UNITS UNDER THE SKIN EVERY DAY 15 mL 0   [START ON 06/01/2023] Vilazodone HCl (VIIBRYD) 40 MG TABS Take 1 tablet (40 mg total) by mouth daily. 90 tablet 1   ziprasidone (GEODON) 40 MG capsule Take 1 capsule (40 mg total) by mouth 2 (two) times daily with a meal. (Patient taking differently: Take 40 mg by mouth every evening.) 60 capsule 1   ziprasidone (GEODON) 60 MG capsule Take 1 capsule (60 mg total) by mouth daily. 90 capsule 0   No current facility-administered medications for this visit.     Musculoskeletal: Strength & Muscle Tone:  NA Gait & Station:  N/A Patient leans: N/A  Psychiatric Specialty Exam: Review of Systems  Psychiatric/Behavioral:  Positive for dysphoric mood, hallucinations and sleep disturbance. Negative for agitation, behavioral problems, confusion, decreased concentration, self-injury and suicidal ideas. The patient is nervous/anxious. The patient is not hyperactive.   All other systems reviewed and are negative.   There were no vitals taken for this visit.There is no height or weight on file to calculate  BMI.  General Appearance: Well Groomed  Eye Contact:  Good  Speech:  Clear and Coherent  Volume:  Normal  Mood:   not good  Affect:   Appropriate, Congruent, and down  Thought Process:  Coherent  Orientation:  Full (Time, Place, and Person)  Thought Content: Logical   Suicidal Thoughts:  No  Homicidal Thoughts:  No  Memory:  Immediate;   Good  Judgement:  Good  Insight:  Good  Psychomotor Activity:  Normal  Concentration:  Concentration: Good and Attention Span: Good  Recall:  Good  Fund of Knowledge: Good  Language: Good  Akathisia:  No  Handed:  Right  AIMS (if indicated): not done  Assets:  Communication Skills Desire for Improvement  ADL's:  Intact  Cognition: WNL  Sleep:  Poor   Screenings: AIMS    Flowsheet Row Office Visit from 05/12/2017 in [facility] Visit from [facility]29/2016 in [facility] Office Visit from 05/06/2016 in [facility] Office Visit from 04/01/2016 in [facility] Office Visit from 03/28/2015 in [facility]  AIMS Total Score 0 0 0 0 0      GAD-7    Flowsheet Row Office Visit from 12/30/2022 in [facility] Office Visit from 06/23/2022 in [facility] Office Visit from 06/16/2022 in [facility] Office Visit from 05/07/2022 in [facility] Office Visit from 03/23/2022 in [facility]  Total GAD-7 Score 14 5 6 19 8       Mini-Mental    Flowsheet Row Office Visit from 09/07/2019 in [facility]  Total Score (max 30 points ) 16      PHQ2-9    Flowsheet Row Office Visit from 12/30/2022 in [facility] Clinical Support from 09/18/2022 in [facility] Office Visit from 06/23/2022 in [facility] Office Visit from 06/16/2022 in [facility] Office Visit from 05/07/2022 in [facility]  PHQ-2 Total Score 2 6 2 2 2   PHQ-9 Total Score 13 9 9 9 17       Flowsheet Row Office Visit from 06/16/2022 in [facility] Office Visit from 05/07/2022 in [facility] Office Visit from 01/22/2022 in [facility]  C-SSRS RISK CATEGORY No Risk No Risk No Risk        Assessment and Plan:  KOSHA JAQUITH is a 53 y.o. year old female with a history of schizoaffective disorder,  sleep apnea (using CPAP machine), diabetes, hypertension, hyperlipidemia, GERD, who presents for follow up appointment for below.   1. Schizoaffective disorder, depressive type (HCC) 2. Obsessive-compulsive disorder, unspecified type 3. Anxiety state Acute stressors include:  Other stressors include: her husband being registered as a sex offender    History: Tx from Dr. Thea Gist. no psych admission. Originally on Invega 234 mg IM. Perphenazine 6 mg daily, viibryd 40 mg daily, amantadine 100 mg BID, gabapentin 600 mg TID. Unable to continue injection since May due to financial strain      She reports worsening in anxiety, and continues to experience paranoia, depressive symptoms.  She has been erroneously taking lower dose of ziprasidone, and is willing to uptitrate  as discussed at her last visit to optimize treatment for schizoaffective disorder.  Discussed potential risk of akathisia, metabolic side effects and EPS.  Will continue Depakote for mood dysregulation.  Will continue Viibryd to target depression, and gabapentin for anxiety.   4. High risk medication use She was advised again to obtain EKG, and labs to safely continue/adjust the medication.    Plan Increase Geodon 60 mg in AM, 40 mg in PM  (QTc 441 msec, HR 76, 10/2022, lipid checked Sept 2024) Obtain EKG - please  call [phone] to make an appointment  Continue Depakote 750 mg daily  Obtain labs (VPA, CBC, CMP) at [facility] Continue Viibryd 40 mg daily Continue gabapentin 600 mg 3 times a day Next appointment- 3/27 at 8 30 for 30 mins IP - She [facility] at [facility] - on metformin - will plan to recheck prolactin in the future   Past trials of medication: Abilify (hives), olanzapine (hives from 7.5 mg, quetiapine, risperidone (stopped working),    The patient demonstrates the following risk factors for suicide: Chronic risk factors for suicide include: psychiatric disorder of schizoaffective disorder. Acute risk factors for suicide include: unemployment. Protective factors for this patient include: positive social support, responsibility to others (children, family), hope for the future and religious beliefs against suicide. Considering these factors, the overall suicide risk at this point appears to be low. Patient is appropriate for outpatient follow up.  Although she has guns at home, she does not have access to them.      Collaboration of Care: Collaboration of Care: Other reviewed notes in Epic  Patient/Guardian was advised Release of Information must be obtained prior to any record release in order to collaborate their care with an outside provider. Patient/Guardian was advised if they have not already done so to contact the registration department to sign all necessary forms in order for [place] to release information regarding their care.   Consent: Patient/Guardian gives verbal consent for treatment and assignment of benefits for services provided during this visit. Patient/Guardian expressed understanding and agreed to proceed.    [name], MD 05/26/2023, 2:59 PM

## 2023-05-26 ENCOUNTER — Encounter: Payer: Self-pay | Admitting: Psychiatry

## 2023-05-26 ENCOUNTER — Telehealth (INDEPENDENT_AMBULATORY_CARE_PROVIDER_SITE_OTHER): Payer: Medicare Other | Admitting: Psychiatry

## 2023-05-26 DIAGNOSIS — Z79899 Other long term (current) drug therapy: Secondary | ICD-10-CM | POA: Diagnosis not present

## 2023-05-26 DIAGNOSIS — F429 Obsessive-compulsive disorder, unspecified: Secondary | ICD-10-CM

## 2023-05-26 DIAGNOSIS — F411 Generalized anxiety disorder: Secondary | ICD-10-CM

## 2023-05-26 DIAGNOSIS — F251 Schizoaffective disorder, depressive type: Secondary | ICD-10-CM

## 2023-05-26 MED ORDER — DIVALPROEX SODIUM ER 250 MG PO TB24: 250.0000 mg | ORAL_TABLET | Freq: Every day | ORAL | 0 refills | Status: DC

## 2023-05-26 MED ORDER — VILAZODONE HCL 40 MG PO TABS: 40.0000 mg | ORAL_TABLET | Freq: Every day | ORAL | 1 refills | Status: DC

## 2023-05-26 NOTE — Patient Instructions (Signed)
Increase Geodon 60 mg in AM, 40 mg in PM   Obtain EKG  Continue Depakote 750 mg daily  Obtain labs (VPA, CBC, CMP) at [facility] Continue Viibryd 40 mg daily Continue gabapentin 600 mg 3 times a day Next appointment- 3/27 at 8 30

## 2023-06-23 ENCOUNTER — Encounter: Payer: Self-pay | Admitting: Internal Medicine

## 2023-07-01 ENCOUNTER — Encounter: Payer: Self-pay | Admitting: Internal Medicine

## 2023-07-04 ENCOUNTER — Other Ambulatory Visit: Payer: Self-pay | Admitting: Psychiatry

## 2023-07-08 ENCOUNTER — Other Ambulatory Visit: Payer: Self-pay | Admitting: Internal Medicine

## 2023-07-09 NOTE — Telephone Encounter (Signed)
 Requested Prescriptions  Pending Prescriptions Disp Refills   TRESIBA FLEXTOUCH 100 UNIT/ML FlexTouch Pen [Pharmacy Med Name: TRESIBA FLEXTOUCH PEN ([id number]] 15 mL 0    Sig: ADMINISTER 40 UNITS UNDER THE SKIN EVERY DAY     Endocrinology:  Diabetes - Insulins Failed - 07/09/2023 10:47 AM      Failed - HBA1C is between 0 and 7.9 and within 180 days    Hemoglobin A1C  Date Value Ref Range Status  12/30/2022 7.9 (A) 4.0 - 5.6 % Final   HB A1C (BAYER DCA - WAIVED)  Date Value Ref Range Status  10/22/2017 6.9 <7.0 % Final    Comment:                                          Diabetic Adult            <7.0                                       Healthy Adult        4.3 - 5.7                                                           (DCCT/NGSP) American Diabetes Association's Summary of Glycemic Recommendations for Adults with Diabetes: Hemoglobin A1c <7.0%. More stringent glycemic goals (A1c <6.0%) may further reduce complications at the cost of increased risk of hypoglycemia.    Hgb A1c MFr Bld  Date Value Ref Range Status  06/23/2022 8.8 (H) <5.7 % of total Hgb Final    Comment:    For someone without known diabetes, a hemoglobin A1c value of 6.5% or greater indicates that they may have  diabetes and this should be confirmed with a follow-up  test. . For someone with known diabetes, a value <7% indicates  that their diabetes is well controlled and a value  greater than or equal to 7% indicates suboptimal  control. A1c targets should be individualized based on  duration of diabetes, age, comorbid conditions, and  other considerations. . Currently, no consensus exists regarding use of hemoglobin A1c for diagnosis of diabetes for children. Verna Czech - Valid encounter within last 6 months    Recent Outpatient Visits           2 months ago Acute non-recurrent maxillary sinusitis   [place] [facility] [place], [place] W, NP   6 months ago Type 2  diabetes mellitus with diabetic polyneuropathy, with long-term current use of insulin [facility])   [facility] [place], Salvadore [place], NP   11 months ago Dermatitis due to plants, including poison ivy, sumac, and oak   [place] [facility] [place], Oswaldo Conroy, PA-C   1 year ago Type 2 diabetes mellitus with diabetic polyneuropathy, with long-term current use of insulin [facility])   [place] [facility] [place], Salvadore Oxford, NP   1 year ago Encounter for general adult medical examination with abnormal findings   [place] [facility] [place], [place]  W, NP

## 2023-07-18 NOTE — Progress Notes (Unsigned)
 [facility] MD/PA/NP OP Progress Note  07/22/2023 9:07 AM Tabitha [name]  MRN:  [id number]  Chief Complaint:  Chief Complaint  Patient presents with   Follow-up   HPI:  This is a follow-up appointment for schizoaffective disorder, OCD, anxiety.  She states that she is having problems.  She could not go to the concern for her son as she feels people are after her.  She had panic attacks afterwards, and her sister-in-law provided support.  Although she was able to go to another concert, she has racing thoughts whether or not she stays.  She cannot keep herself calm due to these thoughts.  She is also worried about her son.  He struggles with depression.  She is trying to make an arrangement so that he can be seen by a therapist.  She feels that she is making the problem worse.  She cannot deal with things, and is concerned that people are out to get her.  Although she has not been able to get EKG, she was able to obtain labs.  Last were reviewed.  She has not seen by her primary care, and she agrees to have follow-up for hyperglycemia.  She has been taking additional ziprasidone 40 mg with the thought that it was instructed.  She agrees to lower the dose back to original dose until EKGs were reviewed.  She tends to sleep during the day up to 2 hours.  She feels tired and depressed.  She denies SI, HI.  She denies decreased need for sleep or euphoria.  She agrees with the plan as outlined below.   Wt Readings from Last 3 Encounters:  05/06/23 240 lb (108.9 kg)  02/25/23 245 lb 6.4 oz (111.3 kg)  12/30/22 244 lb (110.7 kg)     Daily routine: house chores, goes to church weekly.  Exercise: Employment: unemployed, on disability due to paranoia (could not keep her job due to paranoia that people coming to her workplace). used to work at [employer] until 2007 (quit due to pregnancy) Support: husband, 1, son, parents Household: husband (unemployed, had gas gangrene in his right side extremities s/p bilateral  amputation, adl independent), 2 sons Marital status: married Number of children: 2 sons Education: college, majored in Psychologist, educational, music  Visit Diagnosis:    ICD-10-CM   1. Schizoaffective disorder, depressive type (HCC)  F25.1 gabapentin (NEURONTIN) 600 MG tablet    2. Obsessive-compulsive disorder, unspecified type  F42.9     3. Anxiety state  F41.1     4. High risk medication use  Z79.899 Valproic acid level    CBC    Comprehensive metabolic panel with GFR    Prolactin      Past Psychiatric History: Please see initial evaluation for full details. I have reviewed the history. No updates at this time.     Past Medical History:  Past Medical History:  Diagnosis Date   Allergic rhinitis    Anxiety    Asthma    Bipolar disorder (HCC)    Depression    Diabetes mellitus without complication (HCC)    GERD (gastroesophageal reflux disease)    Hyperlipidemia    Hypertension    Mild sleep apnea    OCD (obsessive compulsive disorder)    Palpitations    Paranoia (HCC)    Schizoaffective disorder (HCC)    Sleep apnea     Past Surgical History:  Procedure Laterality Date   CESAREAN SECTION  2006/2008   CHOLECYSTECTOMY  2006  Family Psychiatric History: Please see initial evaluation for full details. I have reviewed the history. No updates at this time.     Family History:  Family History  Problem Relation Age of Onset   Lupus Mother    Diabetes Mother        pre diabetes   Diabetes Father    Hyperlipidemia Father    Diabetes Brother    Breast cancer Maternal Grandmother    Depression Paternal Grandmother    Bipolar disorder Paternal Grandmother    Cancer - Colon Neg Hx     Social History:  Social History   Socioeconomic History   Marital status: Married    Spouse name: Eretria Manternach   Number of children: 2   Years of education: 16   Highest education level: Bachelor's degree (e.g., BA, AB, BS)  Occupational History   Occupation: disability  Tobacco Use    Smoking status: Never   Smokeless tobacco: Never  Vaping Use   Vaping status: Never Used  Substance and Sexual Activity   Alcohol use: Not Currently   Drug use: Never   Sexual activity: Not Currently    Partners: Male    Birth control/protection: Surgical  Other Topics Concern   Not on file  Social History Narrative   Not on file   Social Drivers of Health   Financial Resource Strain: Medium Risk (12/30/2022)   Overall Financial Resource Strain (CARDIA)    Difficulty of Paying Living Expenses: Somewhat hard  Food Insecurity: Food Insecurity Present (12/30/2022)   Hunger Vital Sign    Worried About Running Out of Food in the Last Year: Sometimes true    Ran Out of Food in the Last Year: Sometimes true  Transportation Needs: Unmet Transportation Needs (12/30/2022)   PRAPARE - Administrator, Civil Service (Medical): Yes    Lack of Transportation (Non-Medical): No  Physical Activity: Inactive (12/30/2022)   Exercise Vital Sign    Days of Exercise per Week: 0 days    Minutes of Exercise per Session: 0 min  Stress: Stress Concern Present (12/30/2022)   [employer] of Occupational Health - Occupational Stress Questionnaire    Feeling of Stress : To some extent  Social Connections: Socially Integrated (12/30/2022)   Social Connection and Isolation Panel [NHANES]    Frequency of Communication with Friends and Family: Three times a week    Frequency of Social Gatherings with Friends and Family: Once a week    Attends Religious Services: More than 4 times per year    Active Member of [employer] or Organizations: Yes    Attends Engineer, structural: More than 4 times per year    Marital Status: Married    Allergies:  Allergies  Allergen Reactions   Abilify [Aripiprazole] Hives    Metabolic Disorder Labs: Lab Results  Component Value Date   HGBA1C 7.9 (A) 12/30/2022   MPG 206 06/23/2022   MPG 171 05/21/2021   Lab Results  Component Value Date   PROLACTIN 37.8  (H) 01/22/2022   PROLACTIN 84.8 (H) 06/01/2017   Lab Results  Component Value Date   CHOL 133 01/07/2023   TRIG 317 (H) 01/07/2023   HDL 48 (L) 01/07/2023   CHOLHDL 2.8 01/07/2023   VLDL 51 (H) 04/23/2017   LDLCALC 52 01/07/2023   LDLCALC  06/23/2022     Comment:     . LDL cholesterol not calculated. Triglyceride levels greater than 400 mg/dL invalidate calculated LDL results. . Reference range: <  100 . Desirable range <100 mg/dL for primary prevention;   <70 mg/dL for patients with CHD or diabetic patients  with > or = 2 CHD risk factors. Marland Kitchen LDL-C is now calculated using the Martin-Hopkins  calculation, which is a validated novel method providing  better accuracy than the Friedewald equation in the  estimation of LDL-C.  Horald Pollen et al. Lenox Ahr. 0272;536(64): 2061-2068  (http://education.[employer].com/faq/FAQ164)    Lab Results  Component Value Date   TSH 1.026 01/22/2022   TSH 1.56 12/17/2021    Therapeutic Level Labs: No results found for: "LITHIUM" Lab Results  Component Value Date   VALPROATE 34 (L) 07/21/2023   VALPROATE 13 (L) 06/16/2022   No results found for: "CBMZ"  Current Medications: Current Outpatient Medications  Medication Sig Dispense Refill   divalproex (DEPAKOTE ER) 500 MG 24 hr tablet Take 2 tablets (1,000 mg total) by mouth daily. 180 tablet 0   ACCU-CHEK GUIDE TEST test strip USE TO TEST TWICE DAILY AS DIRECTED 200 strip 0   albuterol (VENTOLIN HFA) 108 (90 Base) MCG/ACT inhaler INHALE 2 PUFFS INTO THE LUNGS EVERY 6 HOURS AS NEEDED FOR WHEEZING OR SHORTNESS OF BREATH 25.5 g 3   amoxicillin-clavulanate (AUGMENTIN) 875-125 MG tablet Take 1 tablet by mouth 2 (two) times daily. 20 tablet 0   atorvastatin (LIPITOR) 40 MG tablet TAKE 1 TABLET(40 MG) BY MOUTH DAILY 90 tablet 3   divalproex (DEPAKOTE ER) 250 MG 24 hr tablet Take 1 tablet (250 mg total) by mouth daily. Take along with 500 mg tab. (Patient not taking: Reported on 07/22/2023) 90  tablet 0   divalproex (DEPAKOTE ER) 500 MG 24 hr tablet Take 1 tablet (500 mg total) by mouth daily. Take along with 250 mg p.o. daily-total of 750 mg p.o. daily. 90 tablet 0   EPINEPHrine 0.3 mg/0.3 mL IJ SOAJ injection Inject 0.3 mg into the muscle as needed for anaphylaxis. 1 each 0   ezetimibe (ZETIA) 10 MG tablet TAKE 1 TABLET(10 MG) BY MOUTH DAILY 90 tablet 0   fluticasone (FLOVENT HFA) 110 MCG/ACT inhaler Inhale 1 puff into the lungs 2 (two) times daily. 1 Inhaler 11   [START ON 08/06/2023] gabapentin (NEURONTIN) 600 MG tablet Take 1 tablet (600 mg total) by mouth 3 (three) times daily. 270 tablet 0   glipiZIDE (GLUCOTROL) 10 MG tablet TAKE 1 TABLET(10 MG) BY MOUTH TWICE DAILY BEFORE A MEAL 180 tablet 1   ibuprofen (ADVIL) 200 MG tablet Take 200 mg by mouth every 6 (six) hours as needed.     Insulin Pen Needle (BD PEN NEEDLE NANO U/F) 32G X 4 MM MISC 1 each by Does not apply route daily. 100 each 2   MELATONIN PO Take 5 mg by mouth at bedtime as needed.     metFORMIN (GLUCOPHAGE) 500 MG tablet TAKE 2 TABLETS(1000 MG) BY MOUTH TWICE DAILY WITH A MEAL 120 tablet 0   omeprazole (PRILOSEC) 10 MG capsule TAKE 1 CAPSULE(10 MG) BY MOUTH DAILY 90 capsule 1   TRESIBA FLEXTOUCH 100 UNIT/ML FlexTouch Pen ADMINISTER 40 UNITS UNDER THE SKIN EVERY DAY 15 mL 0   Vilazodone HCl (VIIBRYD) 40 MG TABS Take 1 tablet (40 mg total) by mouth daily. 90 tablet 1   ziprasidone (GEODON) 40 MG capsule Take 1 capsule (40 mg total) by mouth every evening. 90 capsule 0   [START ON 07/26/2023] ziprasidone (GEODON) 60 MG capsule Take 1 capsule (60 mg total) by mouth daily. 90 capsule 0   No current facility-administered  medications for this visit.     Musculoskeletal: Strength & Muscle Tone:  N/A Gait & Station:  N/A Patient leans: N/A  Psychiatric Specialty Exam: Review of Systems  Psychiatric/Behavioral:  Positive for dysphoric mood, hallucinations and sleep disturbance. Negative for agitation, behavioral  problems, confusion, decreased concentration, self-injury and suicidal ideas. The patient is nervous/anxious. The patient is not hyperactive.   All other systems reviewed and are negative.   There were no vitals taken for this visit.There is no height or weight on file to calculate BMI.  General Appearance: Well Groomed  Eye Contact:  Good  Speech:  Clear and Coherent  Volume:  Normal  Mood:  Depressed  Affect:  Appropriate, Congruent, and tense  Thought Process:  Coherent  Orientation:  Full (Time, Place, and Person)  Thought Content: Logical   Suicidal Thoughts:  No  Homicidal Thoughts:  No  Memory:  Immediate;   Good  Judgement:  Good  Insight:  Good  Psychomotor Activity:  Normal  Concentration:  Concentration: Good and Attention Span: Good  Recall:  Good  Fund of Knowledge: Good  Language: Good  Akathisia:  No  Handed:  Right  AIMS (if indicated): not done  Assets:  Communication Skills Desire for Improvement  ADL's:  Intact  Cognition: WNL  Sleep:   hypersomnia   Screenings: AIMS    Flowsheet Row Office Visit from 05/12/2017 in [facility] Office Visit from 07/29/2016 in [facility] Office Visit from 05/06/2016 in [facility] Office Visit from 04/01/2016 in [facility] Office Visit from 03/28/2015 in [facility]  AIMS Total Score 0 0 0 0 0      GAD-7    Flowsheet Row Office Visit from 12/30/2022 in [facility] Office Visit from 06/23/2022 in [facility] Office Visit from 06/16/2022 in [facility] Office Visit from 05/07/2022 in [facility] Office Visit from 03/23/2022 in [facility]  Total GAD-7 Score 14 5 6 19 8        Mini-Mental    Flowsheet Row Office Visit from 09/07/2019 in [facility]  Total Score (max 30 points ) 16      PHQ2-9    Flowsheet Row Office Visit from 12/30/2022 in [facility] Clinical Support from 09/18/2022 in [facility] Office Visit from 06/23/2022 in [facility] Office Visit from 06/16/2022 in [facility] Office Visit from 05/07/2022 in [facility]  PHQ-2 Total Score 2 6 2 2 2   PHQ-9 Total Score 13 9 9 9 17       Flowsheet Row Office Visit from 06/16/2022 in [facility] Office Visit from 05/07/2022 in [facility] Office Visit from 01/22/2022 in [facility]  C-SSRS RISK CATEGORY No Risk No Risk No Risk        Assessment and Plan:  Tabitha [name] is a 53 y.o. year old female with a history of schizoaffective disorder,  sleep apnea (using CPAP machine), diabetes, hypertension, hyperlipidemia, GERD, who presents for follow up appointment for below.   1. Schizoaffective disorder, depressive type (HCC) 2. Obsessive-compulsive disorder, unspecified type 3. Anxiety state Acute  stressors include:  Other stressors include: her husband being registered as a sex offender    History: Tx from Dr. Thea Gist. no psych admission. Originally on Invega 234 mg IM. Perphenazine 6 mg daily, viibryd 40 mg daily, amantadine 100 mg BID, gabapentin 600 mg TID. Unable to continue injection since May due to financial strain   Significantly worsening in anxiety, and paranoia, depressive symptoms.  It has been significantly affecting her function to go outside, including joining a choir.  Will continue current dose of ziprasidone to target schizoaffective disorder, with hope to uptitration after  obtaining EKG. Will uptitrate Depakote for mood dysregulation.  Will continue Viibryd for depression, and gabapentin for anxiety.   4. High risk medication use She was advised again to obtain EKG to monitor QTc prolongation.  Will also obtain labs to monitor after uptitration of Depakote.    # high prolactin level Will recheck prolactin given her history of high prolactin level, which is likely secondary to adverse reaction from antipsychotics.   # hyperglycemia Recent labs shows hyperglycemia despite her being on metformin . She was advised to have follow-up with her primary care for further evaluation.  Will continue to monitor this especially given side effects from ziprasidone.     Last checked  EKG HR , QTc msec pending  Lipid panels LDL 42, TG 317 9.2024  HbA1c 7.9 9.2024    # Hyponatremia Newly addressed on the recent blood test.  We will check and intervene as needed.   Plan Continue Geodon 60 mg in AM, 40 mg in PM  (QTc 441 msec, HR 76, 10/2022) Obtain EKG - please call [phone] to make an appointment  Increase Depakote 1000 mg daily Obtain labs (VPA, CBC, CMP, prolactin) at [facility] Continue Viibryd 40 mg daily Continue gabapentin 600 mg 3 times a day Next appointment- 5/5 at 2:30, IP She was advised to have sooner follow up with her primary care - She sees her PCP at [facility] - on metformin   Past trials of medication: Abilify (hives), olanzapine (hives from 7.5 mg, quetiapine, risperidone (stopped working),    The patient demonstrates the following risk factors for suicide: Chronic risk factors for suicide include: psychiatric disorder of schizoaffective disorder. Acute risk factors for suicide include: unemployment. Protective factors for this patient include: positive social support, responsibility to others (children, family), hope for the future and religious beliefs against suicide. Considering these factors, the overall suicide risk at this point appears  to be low. Patient is appropriate for outpatient follow up.  Although she has guns at home, she does not have access to them.    Collaboration of Care: Collaboration of Care: Other reviewed notes in Epic  Patient/Guardian was advised Release of Information must be obtained prior to any record release in order to collaborate their care with an outside provider. Patient/Guardian was advised if they have not already done so to contact the registration department to sign all necessary forms in order for [place] to release information regarding their care.   Consent: Patient/Guardian gives verbal consent for treatment and assignment of benefits for services provided during this visit. Patient/Guardian expressed understanding and agreed to proceed.    [name], MD 07/22/2023, 9:07 AM

## 2023-07-21 ENCOUNTER — Other Ambulatory Visit
Admission: RE | Admit: 2023-07-21 | Discharge: 2023-07-21 | Disposition: A | Discharge status: Home / Self Care | Source: Ambulatory Visit | Attending: Psychiatry | Admitting: Psychiatry

## 2023-07-21 DIAGNOSIS — F251 Schizoaffective disorder, depressive type: Secondary | ICD-10-CM

## 2023-07-21 DIAGNOSIS — Z79899 Other long term (current) drug therapy: Secondary | ICD-10-CM | POA: Diagnosis not present

## 2023-07-21 LAB — CBC
HCT: 36.2 % (ref 36.0–46.0)
Hemoglobin: 13.1 g/dL (ref 12.0–15.0)
MCH: 30.6 pg (ref 26.0–34.0)
MCHC: 36.2 g/dL — ABNORMAL HIGH (ref 30.0–36.0)
MCV: 84.6 fL (ref 80.0–100.0)
Platelets: 182 10*3/uL (ref 150–400)
RBC: 4.28 MIL/uL (ref 3.87–5.11)
RDW: 13.4 % (ref 11.5–15.5)
WBC: 7.6 10*3/uL (ref 4.0–10.5)
nRBC: 0 % (ref 0.0–0.2)

## 2023-07-21 LAB — COMPREHENSIVE METABOLIC PANEL
ALT: 20 U/L (ref 0–44)
AST: 21 U/L (ref 15–41)
Albumin: 3.9 g/dL (ref 3.5–5.0)
Alkaline Phosphatase: 87 U/L (ref 38–126)
Anion gap: 11 (ref 5–15)
BUN: 14 mg/dL (ref 6–20)
CO2: 23 mmol/L (ref 22–32)
Calcium: 9 mg/dL (ref 8.9–10.3)
Chloride: 97 mmol/L — ABNORMAL LOW (ref 98–111)
Creatinine, Ser: 0.73 mg/dL (ref 0.44–1.00)
GFR, Estimated: >60 mL/min (ref >60)
Glucose, Bld: 314 mg/dL — ABNORMAL HIGH (ref 70–99)
Potassium: 4.5 mmol/L (ref 3.5–5.1)
Sodium: 131 mmol/L — ABNORMAL LOW (ref 135–145)
Total Bilirubin: 0.5 mg/dL (ref 0.0–1.2)
Total Protein: 6.8 g/dL (ref 6.5–8.1)

## 2023-07-21 LAB — VALPROIC ACID LEVEL: Valproic Acid Lvl: 34 ug/mL — ABNORMAL LOW (ref 50.0–100.0)

## 2023-07-22 ENCOUNTER — Telehealth (INDEPENDENT_AMBULATORY_CARE_PROVIDER_SITE_OTHER): Payer: Medicare Other | Admitting: Psychiatry

## 2023-07-22 ENCOUNTER — Encounter: Payer: Self-pay | Admitting: Psychiatry

## 2023-07-22 DIAGNOSIS — F251 Schizoaffective disorder, depressive type: Secondary | ICD-10-CM | POA: Diagnosis not present

## 2023-07-22 DIAGNOSIS — Z79899 Other long term (current) drug therapy: Secondary | ICD-10-CM

## 2023-07-22 DIAGNOSIS — F429 Obsessive-compulsive disorder, unspecified: Secondary | ICD-10-CM

## 2023-07-22 DIAGNOSIS — F411 Generalized anxiety disorder: Secondary | ICD-10-CM

## 2023-07-22 MED ORDER — ZIPRASIDONE HCL 60 MG PO CAPS
60.0000 mg | ORAL_CAPSULE | Freq: Every day | ORAL | 0 refills | Status: DC
Start: 2023-07-26 — End: 2024-01-26

## 2023-07-22 MED ORDER — GABAPENTIN 600 MG PO TABS: 600.0000 mg | ORAL_TABLET | Freq: Three times a day (TID) | ORAL | 0 refills | Status: DC

## 2023-07-22 MED ORDER — DIVALPROEX SODIUM ER 500 MG PO TB24: 1000.0000 mg | ORAL_TABLET | Freq: Every day | ORAL | 0 refills | Status: DC

## 2023-07-22 MED ORDER — ZIPRASIDONE HCL 40 MG PO CAPS: 40.0000 mg | ORAL_CAPSULE | Freq: Every evening | ORAL | 0 refills | Status: DC

## 2023-07-22 NOTE — Patient Instructions (Signed)
 Continue Geodon 60 mg in AM, 40 mg in PM  Obtain EKG - please call [phone] to make an appointment  Increase Depakote 1000 mg daily Obtain labs (VPA, CBC, CMP, prolactin) at [facility] Continue Viibryd 40 mg daily Continue gabapentin 600 mg 3 times a day Next appointment- 5/5 at 2:30

## 2023-07-22 NOTE — Progress Notes (Signed)
 Lab results are addressed during the visit.

## 2023-08-03 ENCOUNTER — Encounter: Payer: Self-pay | Admitting: Internal Medicine

## 2023-08-03 DIAGNOSIS — Z794 Long term (current) use of insulin: Secondary | ICD-10-CM

## 2023-08-06 ENCOUNTER — Other Ambulatory Visit: Payer: Self-pay | Admitting: Internal Medicine

## 2023-08-06 DIAGNOSIS — Z794 Long term (current) use of insulin: Secondary | ICD-10-CM

## 2023-08-06 MED ORDER — ALBUTEROL SULFATE HFA 108 (90 BASE) MCG/ACT IN AERS: 2.0000 | INHALATION_SPRAY | Freq: Four times a day (QID) | RESPIRATORY_TRACT | 3 refills | Status: AC | PRN

## 2023-08-06 MED ORDER — BD PEN NEEDLE NANO U/F 32G X 4 MM MISC: 1.0000 | Freq: Every day | 2 refills | Status: DC

## 2023-08-06 MED ORDER — BD PEN NEEDLE NANO U/F 32G X 4 MM MISC: 1.0000 | Freq: Every day | 2 refills | Status: AC

## 2023-08-06 NOTE — Telephone Encounter (Signed)
 Requested Prescriptions  Pending Prescriptions Disp Refills   Insulin Pen Needle (BD PEN NEEDLE NANO U/F) 32G X 4 MM MISC 100 each 2    Sig: 1 each by Does not apply route daily.     Endocrinology: Diabetes - Testing Supplies Failed - 08/06/2023  9:31 AM      Failed - Valid encounter within last 12 months    Recent Outpatient Visits   None

## 2023-08-06 NOTE — Telephone Encounter (Signed)
 Copied from CRM [id number]. Topic: Clinical - Medication Refill >> Aug 06, 2023  8:41 AM Marlow Baars wrote: Most Recent Primary Care Visit:  Provider: Lorre Munroe  Department: [id number]  Visit Type: OFFICE VISIT  Date: 05/06/2023  Medication: Insulin Pen Needle (BD PEN NEEDLE NANO U/F) 32G X 4 MM MISC  Has the patient contacted their pharmacy? No   Is this the correct pharmacy for this prescription? Yes If no, delete pharmacy and type the correct one.  This is the patient's preferred pharmacy:  [id number] - [place], [place] - [address] AT [facility] OF [address] [place] [address] [place] [address] Phone: [phone] Fax: [phone]   Has the prescription been filled recently? No  Is the patient out of the medication? Yes  Has the patient been seen for an appointment in the last year OR does the patient have an upcoming appointment? Yes  Can we respond through MyChart? Yes  Please assist patient further

## 2023-08-18 ENCOUNTER — Encounter: Admitting: Internal Medicine

## 2023-08-26 NOTE — Progress Notes (Unsigned)
 [facility] MD/PA/NP OP Progress Note  08/30/2023 3:05 PM Tabitha [name]  MRN:  [id number]  Chief Complaint:  Chief Complaint  Patient presents with   Follow-up   HPI:  This is a follow-up appointment for schizoaffective disorder, anxiety.  She states that she is not doing well.  She went to the church the other day, she did not feel comfortable.  She feels that people are talking about her.  She also feels like she is pregnant, although it is impossible as she is not sexually active.  However, she thinks she is pregnant, and feeling like people are looking her weird.  She occasionally feels that her family is out to get her, although she notes that they are not. She agrees that it has been somewhat difficult to distinguish what is real.  It has been difficult to rule out.  She has been taking ziprasidone  60 mg twice a day for a week, as she reportedly ran out of ziprasidone  40 mg.  She agrees to contact the office if any issues with medication.  She has been hyper somnolent since uptitration of Depakote .  She has been taking 500 mg BID.  She denies decreased need for sleep or euphoria.  She denies SI.  She agrees with the plan as outlined below.    Wt Readings from Last 3 Encounters:  08/30/23 242 lb (109.8 kg)  05/06/23 240 lb (108.9 kg)  02/25/23 245 lb 6.4 oz (111.3 kg)     Daily routine: house chores, goes to church weekly.  Exercise: Employment: unemployed, on disability due to paranoia (could not keep her job due to paranoia that people coming to her workplace). used to work at [employer] until 2007 (quit due to pregnancy) Support: husband, 38, son, parents Household: husband (unemployed, had gas gangrene in his right side extremities s/p bilateral amputation, adl independent), 2 sons Marital status: married Number of children: 2 sons Education: college, majored in Psychologist, educational, music  Visit Diagnosis:    ICD-10-CM   1. Schizoaffective disorder, depressive type (HCC)  F25.1     2.  Obsessive-compulsive disorder, unspecified type  F42.9     3. Anxiety state  F41.1     4. High risk medication use  Z79.899 EKG 12-Lead      Past Psychiatric History: Please see initial evaluation for full details. I have reviewed the history. No updates at this time.     Past Medical History:  Past Medical History:  Diagnosis Date   Allergic rhinitis    Anxiety    Asthma    Bipolar disorder (HCC)    Depression    Diabetes mellitus without complication (HCC)    GERD (gastroesophageal reflux disease)    Hyperlipidemia    Hypertension    Mild sleep apnea    OCD (obsessive compulsive disorder)    Palpitations    Paranoia (HCC)    Schizoaffective disorder (HCC)    Sleep apnea     Past Surgical History:  Procedure Laterality Date   CESAREAN SECTION  2006/2008   CHOLECYSTECTOMY  2006    Family Psychiatric History: Please see initial evaluation for full details. I have reviewed the history. No updates at this time.     Family History:  Family History  Problem Relation Age of Onset   Lupus Mother    Diabetes Mother        pre diabetes   Diabetes Father    Hyperlipidemia Father    Diabetes Brother    Breast cancer Maternal  Grandmother    Depression Paternal Grandmother    Bipolar disorder Paternal Grandmother    Cancer - Colon Neg Hx     Social History:  Social History   Socioeconomic History   Marital status: Married    Spouse name: Chelcee Gulbrandson   Number of children: 2   Years of education: 16   Highest education level: Bachelor's degree (e.g., BA, AB, BS)  Occupational History   Occupation: disability  Tobacco Use   Smoking status: Never   Smokeless tobacco: Never  Vaping Use   Vaping status: Never Used  Substance and Sexual Activity   Alcohol use: Not Currently   Drug use: Never   Sexual activity: Not Currently    Partners: Male    Birth control/protection: Surgical  Other Topics Concern   Not on file  Social History Narrative   Not on file    Social Drivers of Health   Financial Resource Strain: Medium Risk (12/30/2022)   Overall Financial Resource Strain (CARDIA)    Difficulty of Paying Living Expenses: Somewhat hard  Food Insecurity: Food Insecurity Present (12/30/2022)   Hunger Vital Sign    Worried About Running Out of Food in the Last Year: Sometimes true    Ran Out of Food in the Last Year: Sometimes true  Transportation Needs: Unmet Transportation Needs (12/30/2022)   PRAPARE - Administrator, Civil Service (Medical): Yes    Lack of Transportation (Non-Medical): No  Physical Activity: Inactive (12/30/2022)   Exercise Vital Sign    Days of Exercise per Week: 0 days    Minutes of Exercise per Session: 0 min  Stress: Stress Concern Present (12/30/2022)   [employer] of Occupational Health - Occupational Stress Questionnaire    Feeling of Stress : To some extent  Social Connections: Socially Integrated (12/30/2022)   Social Connection and Isolation Panel [NHANES]    Frequency of Communication with Friends and Family: Three times a week    Frequency of Social Gatherings with Friends and Family: Once a week    Attends Religious Services: More than 4 times per year    Active Member of [employer] or Organizations: Yes    Attends Engineer, structural: More than 4 times per year    Marital Status: Married    Allergies:  Allergies  Allergen Reactions   Abilify [Aripiprazole] Hives    Metabolic Disorder Labs: Lab Results  Component Value Date   HGBA1C 7.9 (A) 12/30/2022   MPG 206 06/23/2022   MPG 171 05/21/2021   Lab Results  Component Value Date   PROLACTIN 37.8 (H) 01/22/2022   PROLACTIN 84.8 (H) 06/01/2017   Lab Results  Component Value Date   CHOL 133 01/07/2023   TRIG 317 (H) 01/07/2023   HDL 48 (L) 01/07/2023   CHOLHDL 2.8 01/07/2023   VLDL 51 (H) 04/23/2017   LDLCALC 52 01/07/2023   LDLCALC  06/23/2022     Comment:     . LDL cholesterol not calculated. Triglyceride levels greater  than 400 mg/dL invalidate calculated LDL results. . Reference range: <100 . Desirable range <100 mg/dL for primary prevention;   <70 mg/dL for patients with CHD or diabetic patients  with > or = 2 CHD risk factors. Aaron Aas LDL-C is now calculated using the Martin-Hopkins  calculation, which is a validated novel method providing  better accuracy than the Friedewald equation in the  estimation of LDL-C.  Melinda Sprawls et al. Erroll Heard. 7253;664(40): 2061-2068  (http://education.[employer].com/faq/FAQ164)    Lab  Results  Component Value Date   TSH 1.026 01/22/2022   TSH 1.56 12/17/2021    Therapeutic Level Labs: No results found for: "LITHIUM" Lab Results  Component Value Date   VALPROATE 34 (L) 07/21/2023   VALPROATE 13 (L) 06/16/2022   No results found for: "CBMZ"  Current Medications: Current Outpatient Medications  Medication Sig Dispense Refill   ACCU-CHEK GUIDE TEST test strip USE TO TEST TWICE DAILY AS DIRECTED 200 strip 0   albuterol  (VENTOLIN  HFA) 108 (90 Base) MCG/ACT inhaler Inhale 2 puffs into the lungs every 6 (six) hours as needed for wheezing or shortness of breath. 25.5 g 3   amoxicillin -clavulanate (AUGMENTIN ) 875-125 MG tablet Take 1 tablet by mouth 2 (two) times daily. 20 tablet 0   atorvastatin  (LIPITOR) 40 MG tablet TAKE 1 TABLET(40 MG) BY MOUTH DAILY 90 tablet 3   divalproex  (DEPAKOTE  ER) 500 MG 24 hr tablet Take 2 tablets (1,000 mg total) by mouth daily. 180 tablet 0   EPINEPHrine  0.3 mg/0.3 mL IJ SOAJ injection Inject 0.3 mg into the muscle as needed for anaphylaxis. 1 each 0   ezetimibe  (ZETIA ) 10 MG tablet TAKE 1 TABLET(10 MG) BY MOUTH DAILY 90 tablet 0   fluticasone  (FLOVENT  HFA) 110 MCG/ACT inhaler Inhale 1 puff into the lungs 2 (two) times daily. 1 Inhaler 11   gabapentin  (NEURONTIN ) 600 MG tablet Take 1 tablet (600 mg total) by mouth 3 (three) times daily. 270 tablet 0   glipiZIDE  (GLUCOTROL ) 10 MG tablet TAKE 1 TABLET(10 MG) BY MOUTH TWICE DAILY BEFORE A  MEAL 180 tablet 1   ibuprofen (ADVIL) 200 MG tablet Take 200 mg by mouth every 6 (six) hours as needed.     Insulin  Pen Needle (BD PEN NEEDLE NANO U/F) 32G X 4 MM MISC 1 each by Does not apply route daily. 100 each 2   MELATONIN PO Take 5 mg by mouth at bedtime as needed.     metFORMIN  (GLUCOPHAGE ) 500 MG tablet TAKE 2 TABLETS(1000 MG) BY MOUTH TWICE DAILY WITH A MEAL 120 tablet 0   omeprazole  (PRILOSEC) 10 MG capsule TAKE 1 CAPSULE(10 MG) BY MOUTH DAILY 90 capsule 1   TRESIBA  FLEXTOUCH 100 UNIT/ML FlexTouch Pen ADMINISTER 40 UNITS UNDER THE SKIN EVERY DAY 15 mL 0   Vilazodone  HCl (VIIBRYD ) 40 MG TABS Take 1 tablet (40 mg total) by mouth daily. 90 tablet 1   ziprasidone  (GEODON ) 40 MG capsule Take 1 capsule (40 mg total) by mouth every evening. 90 capsule 0   ziprasidone  (GEODON ) 60 MG capsule Take 1 capsule (60 mg total) by mouth daily. 90 capsule 0   No current facility-administered medications for this visit.     Musculoskeletal: Strength & Muscle Tone: within normal limits Gait & Station: normal Patient leans: N/A  Psychiatric Specialty Exam: Review of Systems  Psychiatric/Behavioral:  Positive for decreased concentration, dysphoric mood and sleep disturbance. Negative for agitation, behavioral problems, confusion, hallucinations, self-injury and suicidal ideas. The patient is nervous/anxious. The patient is not hyperactive.   All other systems reviewed and are negative.   Blood pressure 135/77, pulse 85, temperature (!) 97.2 F (36.2 C), temperature source Temporal, height 5\' 2"  (1.575 m), weight 242 lb (109.8 kg).Body mass index is 44.26 kg/m.  General Appearance: Well Groomed  Eye Contact:  Good  Speech:  Clear and Coherent  Volume:  Normal  Mood:  Anxious  Affect:  Appropriate, Congruent, Full Range, Tearful, and down  Thought Process:  Coherent  Orientation:  Full (Time,  Place, and Person)  Thought Content: Logical   Suicidal Thoughts:  No  Homicidal Thoughts:  No   Memory:  Immediate;   Good  Judgement:  Good  Insight:  Good  Psychomotor Activity:  Normal, Normal tone, no rigidity, no resting/postural tremors, no tardive dyskinesia    Concentration:  Concentration: Good and Attention Span: Good  Recall:  Good  Fund of Knowledge: Good  Language: Good  Akathisia:  No  Handed:  Right  AIMS (if indicated): 0   Assets:  Communication Skills Desire for Improvement  ADL's:  Intact  Cognition: WNL  Sleep:   hypersomnia   Screenings: AIMS    Flowsheet Row Office Visit from 05/12/2017 in [facility] Office Visit from 07/29/2016 in [facility] Office Visit from 05/06/2016 in [facility] Office Visit from 04/01/2016 in [facility] Office Visit from 03/28/2015 in [facility]  AIMS Total Score 0 0 0 0 0      GAD-7    Flowsheet Row Office Visit from 12/30/2022 in [facility] Office Visit from 06/23/2022 in [facility] Office Visit from 06/16/2022 in [facility] Office Visit from 05/07/2022 in [facility] Office Visit from 03/23/2022 in [facility]  Total GAD-7 Score 14 5 6 19 8       Mini-Mental    Flowsheet Row Office Visit from 09/07/2019 in [facility]  Total Score (max 30 points ) 16      PHQ2-9    Flowsheet Row Office Visit from 12/30/2022 in [facility] Clinical Support from 09/18/2022 in [facility] Office Visit from 06/23/2022 in [facility] Office Visit from 06/16/2022 in [facility] Office Visit from 05/07/2022 in [facility]  PHQ-2 Total Score 2 6 2 2 2   PHQ-9 Total Score 13 9 9 9 17       Flowsheet Row Office Visit from 06/16/2022 in [facility] Office Visit from 05/07/2022 in [facility] Office Visit from 01/22/2022 in [facility]  C-SSRS RISK CATEGORY No Risk No Risk No Risk        Assessment and Plan:  Tabitha [name] is a 53 y.o. year old female with a history of schizoaffective disorder,  sleep apnea (using CPAP machine), diabetes, hypertension, hyperlipidemia, GERD, who presents for follow up appointment for below.   1. Schizoaffective disorder, depressive type (HCC) 2. Obsessive-compulsive disorder, unspecified type 3. Anxiety state Acute stressors include:  Other stressors include: her husband being registered as a sex offender    History: Tx from Dr. Marcelline Sermons. no psych admission. Originally on Invega  234 mg IM. Perphenazine  6 mg daily, viibryd  40 mg daily, amantadine  100 mg BID, gabapentin  600 mg TID. Unable to continue injection since May due to financial strain   The exam is notable for tearful affect, and she continues to experience paranoia, which has been affecting her daily function/going outside.  It has been repeatedly discussed to obtain EKG. she agrees to get this done for possible uptitration.  Will continue current dose of Depakote  at this time.  She was advised to consolidate Depakote  dosing in the morning to  mitigate risk of drowsiness.  Will continue Viibryd  for depression, along with gabapentin  for anxiety.   4. High risk medication use She was advised to obtain EKG to monitor QTc prolongation, and labs to monitor for Depakote .    # high prolactin level Will recheck prolactin given her history of high prolactin level, which is likely secondary to adverse reaction from antipsychotics.   # hyperglycemia Recent labs shows hyperglycemia  despite her being on metformin  . She was advised to have follow-up with her primary care for further evaluation.  Will continue to monitor this especially given side effects from ziprasidone .        Last checked  EKG HR , QTc msec pending  Lipid panels LDL 42, TG 317 9.2024  HbA1c 7.9 9.2024    # Hyponatremia Newly addressed on the recent blood test.  We will check and intervene as needed.     Plan Continue Geodon  60 mg in AM, 40 mg in PM  (QTc 441 msec, HR 76, 10/2022) Obtain EKG - please call [phone] to make an appointment  Change to: Depakote  1000 mg daily (was taking 500 mg BID) Obtain labs (VPA, CBC, CMP, prolactin) at [facility] Continue Viibryd  40 mg daily Continue gabapentin  600 mg 3 times a day Next appointment- 6/19 at 2:30, IP She was advised to have sooner follow up with her primary care - She [facility] at [facility] - on metformin    Past trials of medication: Abilify (hives), olanzapine  (hives from 7.5 mg, quetiapine, risperidone (stopped working),    The patient demonstrates the following risk factors for suicide: Chronic risk factors for suicide include: psychiatric disorder of schizoaffective disorder. Acute risk factors for suicide include: unemployment. Protective factors for this patient include: positive social support, responsibility to others (children, family), hope for the future and religious beliefs against suicide. Considering these factors, the overall suicide risk at this point appears to be low. Patient is appropriate for outpatient follow up.  Although she has guns at home, she does not have access to them.    Collaboration of Care: Collaboration of Care: Other reviewed notes in Epic  Patient/Guardian was advised Release of Information must be obtained prior to any record release in order to collaborate their care with an outside provider. Patient/Guardian was advised if they have not already done so to contact the registration department to sign  all necessary forms in order for us  to release information regarding their care.   Consent: Patient/Guardian gives verbal consent for treatment and assignment of benefits for services provided during this visit. Patient/Guardian expressed understanding and agreed to proceed.    Todd Fossa, MD 08/30/2023, 3:05 PM

## 2023-08-27 ENCOUNTER — Other Ambulatory Visit: Payer: Self-pay | Admitting: Internal Medicine

## 2023-08-30 ENCOUNTER — Other Ambulatory Visit
Admission: RE | Admit: 2023-08-30 | Discharge: 2023-08-30 | Disposition: A | Discharge status: Home / Self Care | Source: Ambulatory Visit | Attending: Psychiatry | Admitting: Psychiatry

## 2023-08-30 ENCOUNTER — Ambulatory Visit: Admitting: Psychiatry

## 2023-08-30 ENCOUNTER — Other Ambulatory Visit: Payer: Self-pay

## 2023-08-30 ENCOUNTER — Encounter: Payer: Self-pay | Admitting: Psychiatry

## 2023-08-30 VITALS — BP 135/77 | HR 85 | Temp 97.2°F | Ht 62.0 in | Wt 242.0 lb

## 2023-08-30 DIAGNOSIS — F251 Schizoaffective disorder, depressive type: Secondary | ICD-10-CM | POA: Diagnosis not present

## 2023-08-30 DIAGNOSIS — Z79899 Other long term (current) drug therapy: Secondary | ICD-10-CM

## 2023-08-30 DIAGNOSIS — F411 Generalized anxiety disorder: Secondary | ICD-10-CM

## 2023-08-30 DIAGNOSIS — F429 Obsessive-compulsive disorder, unspecified: Secondary | ICD-10-CM | POA: Diagnosis not present

## 2023-08-30 LAB — COMPREHENSIVE METABOLIC PANEL WITH GFR
ALT: 32 U/L (ref 0–44)
AST: 25 U/L (ref 15–41)
Albumin: 3.9 g/dL (ref 3.5–5.0)
Alkaline Phosphatase: 82 U/L (ref 38–126)
Anion gap: 12 (ref 5–15)
BUN: 12 mg/dL (ref 6–20)
CO2: 24 mmol/L (ref 22–32)
Calcium: 9.1 mg/dL (ref 8.9–10.3)
Chloride: 95 mmol/L — ABNORMAL LOW (ref 98–111)
Creatinine, Ser: 0.77 mg/dL (ref 0.44–1.00)
GFR, Estimated: >60 mL/min (ref >60)
Glucose, Bld: 365 mg/dL — ABNORMAL HIGH (ref 70–99)
Potassium: 4.5 mmol/L (ref 3.5–5.1)
Sodium: 131 mmol/L — ABNORMAL LOW (ref 135–145)
Total Bilirubin: 0.6 mg/dL (ref 0.0–1.2)
Total Protein: 7 g/dL (ref 6.5–8.1)

## 2023-08-30 LAB — CBC
HCT: 38.2 % (ref 36.0–46.0)
Hemoglobin: 13.4 g/dL (ref 12.0–15.0)
MCH: 30.5 pg (ref 26.0–34.0)
MCHC: 35.1 g/dL (ref 30.0–36.0)
MCV: 87 fL (ref 80.0–100.0)
Platelets: 168 10*3/uL (ref 150–400)
RBC: 4.39 MIL/uL (ref 3.87–5.11)
RDW: 13.4 % (ref 11.5–15.5)
WBC: 6.9 10*3/uL (ref 4.0–10.5)
nRBC: 0 % (ref 0.0–0.2)

## 2023-08-30 LAB — VALPROIC ACID LEVEL: Valproic Acid Lvl: 81 ug/mL (ref 50–100)

## 2023-08-30 NOTE — Telephone Encounter (Signed)
 Requested Prescriptions  Pending Prescriptions Disp Refills   insulin  degludec (TRESIBA  FLEXTOUCH) 100 UNIT/ML FlexTouch Pen [Pharmacy Med Name: TRESIBA  FLEXTOUCH PEN ([id number]] 15 mL 0    Sig: ADMINISTER 40 UNITS UNDER THE SKIN EVERY DAY. OFFICE VISIT NEEDED FOR ADDITIONAL REFILLS     Endocrinology:  Diabetes - Insulins Failed - 08/30/2023  8:45 PM      Failed - HBA1C is between 0 and 7.9 and within 180 days    Hemoglobin A1C  Date Value Ref Range Status  12/30/2022 7.9 (A) 4.0 - 5.6 % Final   HB A1C (BAYER DCA - WAIVED)  Date Value Ref Range Status  10/22/2017 6.9 <7.0 % Final    Comment:                                          Diabetic Adult            <7.0                                       Healthy Adult        4.3 - 5.7                                                           (DCCT/NGSP) American Diabetes Association's Summary of Glycemic Recommendations for Adults with Diabetes: Hemoglobin A1c <7.0%. More stringent glycemic goals (A1c <6.0%) may further reduce complications at the cost of increased risk of hypoglycemia.    Hgb A1c MFr Bld  Date Value Ref Range Status  06/23/2022 8.8 (H) <5.7 % of total Hgb Final    Comment:    For someone without known diabetes, a hemoglobin A1c value of 6.5% or greater indicates that they may have  diabetes and this should be confirmed with a follow-up  test. . For someone with known diabetes, a value <7% indicates  that their diabetes is well controlled and a value  greater than or equal to 7% indicates suboptimal  control. A1c targets should be individualized based on  duration of diabetes, age, comorbid conditions, and  other considerations. . Currently, no consensus exists regarding use of hemoglobin A1c for diagnosis of diabetes for children. .          Failed - Valid encounter within last 6 months    Recent Outpatient Visits   None

## 2023-08-30 NOTE — Patient Instructions (Addendum)
 Continue Geodon  60 mg in AM, 40 mg in PM  Obtain EKG - please call [phone] to make an appointment  Change to Depakote  1000 mg daily Obtain labs (VPA, CBC, CMP, prolactin) at [facility] Continue Viibryd  40 mg daily Continue gabapentin  600 mg 3 times a day Next appointment- 6/19 at 2:30

## 2023-08-31 ENCOUNTER — Other Ambulatory Visit: Payer: Self-pay | Admitting: Internal Medicine

## 2023-08-31 ENCOUNTER — Encounter: Payer: Self-pay | Admitting: Psychiatry

## 2023-08-31 LAB — PROLACTIN: Prolactin: 16.3 ng/mL (ref 3.6–25.2)

## 2023-08-31 NOTE — Progress Notes (Signed)
 Please contact the patient. Her sodium level remains slightly low, and her glucose level is above the normal range. Her prolactin level is within the normal range. I recommend that she follow up with her primary care provider for further evaluation and treatment as needed. At this time, I recommend continuing the current dose of Depakote .

## 2023-09-02 NOTE — Telephone Encounter (Unsigned)
 Copied from CRM [id number]. Topic: Clinical - Medication Refill >> Sep 02, 2023  8:15 AM Alysia Jumbo S wrote: Medication: Tresiba   Has the patient contacted their pharmacy? Yes (Agent: If no, request that the patient contact the pharmacy for the refill. If patient does not wish to contact the pharmacy document the reason why and proceed with request.) (Agent: If yes, when and what did the pharmacy advise?)  This is the patient's preferred pharmacy:  [id number] [place]op, [place] - [address] AT [facility] [address] [place] [address] [place] [address] Phone: [phone] Fax: [phone]  Is this the correct pharmacy for this prescription? Yes If no, delete pharmacy and type the correct one.   Has the prescription been filled recently? Yes  Is the patient out of the medication? Yes  Has the patient been seen for an appointment in the last year OR does the patient have an upcoming appointment? Yes  Can we respond through MyChart? Yes  Agent: Please be advised that Rx refills may take up to 3 business days. We ask that you follow-up with your pharmacy.

## 2023-09-08 ENCOUNTER — Ambulatory Visit
Admission: RE | Admit: 2023-09-08 | Discharge: 2023-09-08 | Disposition: A | Discharge status: Home / Self Care | Source: Ambulatory Visit | Attending: Psychiatry | Admitting: Psychiatry

## 2023-09-08 DIAGNOSIS — Z79899 Other long term (current) drug therapy: Secondary | ICD-10-CM | POA: Diagnosis present

## 2023-09-24 ENCOUNTER — Encounter: Payer: Self-pay | Admitting: Internal Medicine

## 2023-09-24 ENCOUNTER — Ambulatory Visit: Payer: Medicare Other

## 2023-09-24 ENCOUNTER — Ambulatory Visit (INDEPENDENT_AMBULATORY_CARE_PROVIDER_SITE_OTHER): Admitting: Internal Medicine

## 2023-09-24 VITALS — BP 138/72 | HR 79 | Ht 62.0 in | Wt 240.0 lb

## 2023-09-24 DIAGNOSIS — E782 Mixed hyperlipidemia: Secondary | ICD-10-CM

## 2023-09-24 DIAGNOSIS — I152 Hypertension secondary to endocrine disorders: Secondary | ICD-10-CM

## 2023-09-24 DIAGNOSIS — Z0001 Encounter for general adult medical examination with abnormal findings: Secondary | ICD-10-CM | POA: Diagnosis not present

## 2023-09-24 DIAGNOSIS — E1159 Type 2 diabetes mellitus with other circulatory complications: Secondary | ICD-10-CM

## 2023-09-24 DIAGNOSIS — E1142 Type 2 diabetes mellitus with diabetic polyneuropathy: Secondary | ICD-10-CM

## 2023-09-24 DIAGNOSIS — Z794 Long term (current) use of insulin: Secondary | ICD-10-CM

## 2023-09-24 DIAGNOSIS — Z0000 Encounter for general adult medical examination without abnormal findings: Secondary | ICD-10-CM

## 2023-09-24 MED ORDER — ACCU-CHEK GUIDE TEST VI STRP: ORAL_STRIP | 0 refills | Status: DC

## 2023-09-24 MED ORDER — OLMESARTAN MEDOXOMIL 20 MG PO TABS: 20.0000 mg | ORAL_TABLET | Freq: Every day | ORAL | 1 refills | Status: DC

## 2023-09-24 NOTE — Progress Notes (Signed)
 Subjective:    Patient ID: Tabitha [name], female    DOB: May 30, 1970, 53 y.o.   MRN: [id number]  HPI  Patient presents to clinic today for annual exam.  Flu: 12/2022 Tetanus: 12/2014 Pneumovax: 04/2015 Prevnar 20: 04/2021 COVID: Never Shingrix: Never Pap smear: 05/2022 Mammogram: > 2 years ago Colon screening: 06/2022, Cologuard Vision screening: as needed Dentist: as needed  Diet: She does eat meat. She consumes some fruits and veggies. She does eat some fried foods. She drinks mostly coffee, tea, soda, water. Exercise: Walking  Review of Systems     Past Medical History:  Diagnosis Date   Allergic rhinitis    Anxiety    Asthma    Bipolar disorder (HCC)    Depression    Diabetes mellitus without complication (HCC)    GERD (gastroesophageal reflux disease)    Hyperlipidemia    Hypertension    Mild sleep apnea    OCD (obsessive compulsive disorder)    Palpitations    Paranoia (HCC)    Schizoaffective disorder (HCC)    Sleep apnea     Current Outpatient Medications  Medication Sig Dispense Refill   ACCU-CHEK GUIDE TEST test strip USE TO TEST TWICE DAILY AS DIRECTED 200 strip 0   albuterol  (VENTOLIN  HFA) 108 (90 Base) MCG/ACT inhaler Inhale 2 puffs into the lungs every 6 (six) hours as needed for wheezing or shortness of breath. 25.5 g 3   amoxicillin -clavulanate (AUGMENTIN ) 875-125 MG tablet Take 1 tablet by mouth 2 (two) times daily. 20 tablet 0   atorvastatin  (LIPITOR) 40 MG tablet TAKE 1 TABLET(40 MG) BY MOUTH DAILY 90 tablet 3   divalproex  (DEPAKOTE  ER) 500 MG 24 hr tablet Take 2 tablets (1,000 mg total) by mouth daily. 180 tablet 0   EPINEPHrine  0.3 mg/0.3 mL IJ SOAJ injection Inject 0.3 mg into the muscle as needed for anaphylaxis. 1 each 0   ezetimibe  (ZETIA ) 10 MG tablet TAKE 1 TABLET(10 MG) BY MOUTH DAILY 90 tablet 0   fluticasone  (FLOVENT  HFA) 110 MCG/ACT inhaler Inhale 1 puff into the lungs 2 (two) times daily. 1 Inhaler 11   gabapentin  (NEURONTIN ) 600  MG tablet Take 1 tablet (600 mg total) by mouth 3 (three) times daily. 270 tablet 0   glipiZIDE  (GLUCOTROL ) 10 MG tablet TAKE 1 TABLET(10 MG) BY MOUTH TWICE DAILY BEFORE A MEAL 180 tablet 1   ibuprofen (ADVIL) 200 MG tablet Take 200 mg by mouth every 6 (six) hours as needed.     insulin  degludec (TRESIBA  FLEXTOUCH) 100 UNIT/ML FlexTouch Pen ADMINISTER 40 UNITS UNDER THE SKIN EVERY DAY. OFFICE VISIT NEEDED FOR ADDITIONAL REFILLS 15 mL 0   Insulin  Pen Needle (BD PEN NEEDLE NANO U/F) 32G X 4 MM MISC 1 each by Does not apply route daily. 100 each 2   MELATONIN PO Take 5 mg by mouth at bedtime as needed.     metFORMIN  (GLUCOPHAGE ) 500 MG tablet TAKE 2 TABLETS(1000 MG) BY MOUTH TWICE DAILY WITH A MEAL 120 tablet 0   omeprazole  (PRILOSEC) 10 MG capsule TAKE 1 CAPSULE(10 MG) BY MOUTH DAILY 90 capsule 1   Vilazodone  HCl (VIIBRYD ) 40 MG TABS Take 1 tablet (40 mg total) by mouth daily. 90 tablet 1   ziprasidone  (GEODON ) 40 MG capsule Take 1 capsule (40 mg total) by mouth every evening. 90 capsule 0   ziprasidone  (GEODON ) 60 MG capsule Take 1 capsule (60 mg total) by mouth daily. 90 capsule 0   No current facility-administered medications for this  visit.    Allergies  Allergen Reactions   Abilify [Aripiprazole] Hives    Family History  Problem Relation Age of Onset   Lupus Mother    Diabetes Mother        pre diabetes   Diabetes Father    Hyperlipidemia Father    Diabetes Brother    Breast cancer Maternal Grandmother    Depression Paternal Grandmother    Bipolar disorder Paternal Grandmother    Cancer - Colon Neg Hx     Social History   Socioeconomic History   Marital status: Married    Spouse name: Jamaya Sleeth   Number of children: 2   Years of education: 16   Highest education level: Bachelor's degree (e.g., BA, AB, BS)  Occupational History   Occupation: disability  Tobacco Use   Smoking status: Never   Smokeless tobacco: Never  Vaping Use   Vaping status: Never Used   Substance and Sexual Activity   Alcohol use: Not Currently   Drug use: Never   Sexual activity: Not Currently    Partners: Male    Birth control/protection: Surgical  Other Topics Concern   Not on file  Social History Narrative   Not on file   Social Drivers of Health   Financial Resource Strain: High Risk (09/20/2023)   Overall Financial Resource Strain (CARDIA)    Difficulty of Paying Living Expenses: Hard  Food Insecurity: Food Insecurity Present (09/20/2023)   Hunger Vital Sign    Worried About Running Out of Food in the Last Year: Sometimes true    Ran Out of Food in the Last Year: Sometimes true  Transportation Needs: No Transportation Needs (09/20/2023)   PRAPARE - Administrator, Civil Service (Medical): No    Lack of Transportation (Non-Medical): No  Physical Activity: Unknown (09/20/2023)   Exercise Vital Sign    Days of Exercise per Week: 0 days    Minutes of Exercise per Session: Not on file  Stress: Stress Concern Present (09/20/2023)   [employer] of Occupational Health - Occupational Stress Questionnaire    Feeling of Stress : Rather much  Social Connections: Socially Integrated (09/20/2023)   Social Connection and Isolation Panel [NHANES]    Frequency of Communication with Friends and Family: More than three times a week    Frequency of Social Gatherings with Friends and Family: Once a week    Attends Religious Services: More than 4 times per year    Active Member of [employer] or Organizations: Yes    Attends Banker Meetings: More than 4 times per year    Marital Status: Married  Catering manager Violence: Not At Risk (09/18/2022)   Humiliation, Afraid, Rape, and Kick questionnaire    Fear of Current or Ex-Partner: No    Emotionally Abused: No    Physically Abused: No    Sexually Abused: No     Constitutional: Patient reports intermittent headaches.  Denies fever, malaise, fatigue, or abrupt weight changes.  HEENT: Denies eye  pain, eye redness, ear pain, ringing in the ears, wax buildup, runny nose, nasal congestion, bloody nose, or sore throat. Respiratory: Denies difficulty breathing, shortness of breath, cough or sputum production.   Cardiovascular: Denies chest pain, chest tightness, palpitations or swelling in the hands or feet.  Gastrointestinal: Patient reports intermittent reflux and diarrhea.  Denies abdominal pain, bloating, constipation, or blood in the stool.  GU: Denies urgency, frequency, pain with urination, burning sensation, blood in urine, odor or discharge. Musculoskeletal:  Denies decrease in range of motion, difficulty with gait, muscle pain or joint pain and swelling.  Skin: Denies redness, rashes, lesions or ulcercations.  Neurological: Pt reports neuropathic pain, dizziness. Denies dizziness, difficulty with memory, difficulty with speech or problems with balance and coordination.  Psych: Patient has a history of depression.  Denies anxiety, SI/HI.  No other specific complaints in a complete review of systems (except as listed in HPI above).  Objective:   Physical Exam   BP 138/72   Pulse 79   Ht 5\' 2"  (1.575 m)   Wt 240 lb (108.9 kg)   LMP 08/03/2015   SpO2 97%   BMI 43.90 kg/m    Wt Readings from Last 3 Encounters:  05/06/23 240 lb (108.9 kg)  12/30/22 244 lb (110.7 kg)  09/18/22 232 lb (105.2 kg)    General: Appears her stated age, obese, in NAD. Skin: Warm, dry and intact. No ulcerations noted. HEENT: Head: normal shape and size; Eyes: sclera white, no icterus, conjunctiva pink, PERRLA and EOMs intact;  Neck:  Neck supple, trachea midline. No masses, lumps or thyromegaly present.  Cardiovascular: Normal rate and rhythm. S1,S2 noted.  No murmur, rubs or gallops noted. No JVD or BLE edema. No carotid bruits noted. Pulmonary/Chest: Normal effort and positive vesicular breath sounds. No respiratory distress. No wheezes, rales or ronchi noted.  Abdomen: Soft and nontender.  Normal bowel sounds.  Musculoskeletal: Strength 5/5 BUE/BLE.Aaron Aas No difficulty with gait.  Neurological: Alert and oriented. Cranial nerves II-XII grossly intact. Coordination normal.  Psychiatric: Mood and affect normal. Behavior is normal. Judgment and thought content normal.     BMET    Component Value Date/Time   NA 131 (L) 08/30/2023 1542   NA 137 09/07/2019 1657   K 4.5 08/30/2023 1542   CL 95 (L) 08/30/2023 1542   CO2 24 08/30/2023 1542   GLUCOSE 365 (H) 08/30/2023 1542   BUN 12 08/30/2023 1542   BUN 14 09/07/2019 1657   CREATININE 0.77 08/30/2023 1542   CREATININE 0.72 01/07/2023 1058   CALCIUM  9.1 08/30/2023 1542   GFRNONAA >60 08/30/2023 1542   GFRAA 88 09/07/2019 1657    Lipid Panel     Component Value Date/Time   CHOL 133 01/07/2023 1058   CHOL 138 09/07/2019 1657   CHOL 176 04/23/2017 1341   TRIG 317 (H) 01/07/2023 1058   TRIG 253 (H) 04/23/2017 1341   HDL 48 (L) 01/07/2023 1058   HDL 55 09/07/2019 1657   CHOLHDL 2.8 01/07/2023 1058   VLDL 51 (H) 04/23/2017 1341   LDLCALC 52 01/07/2023 1058    CBC    Component Value Date/Time   WBC 6.9 08/30/2023 1542   RBC 4.39 08/30/2023 1542   HGB 13.4 08/30/2023 1542   HGB 13.3 04/28/2018 1604   HCT 38.2 08/30/2023 1542   HCT 39.6 04/28/2018 1604   PLT 168 08/30/2023 1542   PLT 201 04/28/2018 1604   MCV 87.0 08/30/2023 1542   MCV 86 04/28/2018 1604   MCH 30.5 08/30/2023 1542   MCHC 35.1 08/30/2023 1542   RDW 13.4 08/30/2023 1542   RDW 15.1 04/28/2018 1604   LYMPHSABS 2,690 01/07/2023 1058   LYMPHSABS 2.4 04/28/2018 1604   MONOABS 378 09/26/2015 1154   EOSABS 158 01/07/2023 1058   EOSABS 0.1 04/28/2018 1604   BASOSABS 32 01/07/2023 1058   BASOSABS 0.0 04/28/2018 1604    Hgb A1C Lab Results  Component Value Date   HGBA1C 7.9 (A) 12/30/2022  Assessment & Plan:   Preventative Health Maintenance:  Encouraged her to get a flu shot in the fall Tetanus UTD Encouraged her to get her  COVID-vaccine Pneumovax and Prevnar UTD Discussed Shingrix vaccine, she will check coverage with her insurance company and schedule visit if she would like to have this done Pap smear UTD Mammogram declined Colon screening UTD Encouraged her to consume a balanced diet and exercise regimen Advised her to see an eye doctor and dentist annually We will check CBC, c-Met, lipid, A1c today  RTC in 2 weeks follow up HTN, 6 months, follow-up chronic conditions Helayne Lo, NP

## 2023-09-24 NOTE — Assessment & Plan Note (Signed)
 Uncontrolled off meds We will try olmesartan 20 mg daily Reinforced DASH diet and exercise for weight loss C-Met today

## 2023-09-24 NOTE — Patient Instructions (Signed)

## 2023-09-24 NOTE — Progress Notes (Signed)
 Subjective:   Tabitha [name] is a 53 y.o. who presents for a Medicare Wellness preventive visit.  As a reminder, Annual Wellness Visits don't include a physical exam, and some assessments may be limited, especially if this visit is performed virtually. We may recommend an in-person follow-up visit with your provider if needed.  Visit Complete: Virtual I connected with  Tabitha [name] on 09/24/23 by a audio enabled telemedicine application and verified that I am speaking with the correct person using two identifiers.  Patient Location: Home  Provider Location: Home Office  I discussed the limitations of evaluation and management by telemedicine. The patient expressed understanding and agreed to proceed.  Vital Signs: Because this visit was a virtual/telehealth visit, some criteria may be missing or patient reported. Any vitals not documented were not able to be obtained and vitals that have been documented are patient reported.  VideoDeclined- This patient declined Librarian, academic. Therefore the visit was completed with audio only.  Persons Participating in Visit: Patient.  AWV Questionnaire: No: Patient Medicare AWV questionnaire was not completed prior to this visit.  Cardiac Risk Factors include: diabetes mellitus;hypertension;dyslipidemia;sedentary lifestyle;obesity (BMI >30kg/m2)     Objective:     There were no vitals filed for this visit. There is no height or weight on file to calculate BMI.     09/24/2023   11:03 AM 09/18/2022   11:10 AM 03/31/2022    8:42 AM 09/03/2020    2:42 PM 01/19/2019    2:04 PM 06/06/2018    2:52 PM 01/13/2018    2:43 PM  Advanced Directives  Does Patient Have a Medical Advance Directive? No No No No No  No  Would patient like information on creating a medical advance directive? No - Patient declined No - Patient declined No - Patient declined    Yes (MAU/Ambulatory/Procedural Areas - Information given)      Information is confidential and restricted. Go to Review Flowsheets to unlock data.    Current Medications (verified) Outpatient Encounter Medications as of 09/24/2023  Medication Sig   ACCU-CHEK GUIDE TEST test strip USE TO TEST TWICE DAILY AS DIRECTED   albuterol  (VENTOLIN  HFA) 108 (90 Base) MCG/ACT inhaler Inhale 2 puffs into the lungs every 6 (six) hours as needed for wheezing or shortness of breath.   atorvastatin  (LIPITOR) 40 MG tablet TAKE 1 TABLET(40 MG) BY MOUTH DAILY   divalproex  (DEPAKOTE  ER) 500 MG 24 hr tablet Take 2 tablets (1,000 mg total) by mouth daily.   EPINEPHrine  0.3 mg/0.3 mL IJ SOAJ injection Inject 0.3 mg into the muscle as needed for anaphylaxis.   ezetimibe  (ZETIA ) 10 MG tablet TAKE 1 TABLET(10 MG) BY MOUTH DAILY   fluticasone  (FLOVENT  HFA) 110 MCG/ACT inhaler Inhale 1 puff into the lungs 2 (two) times daily.   gabapentin  (NEURONTIN ) 600 MG tablet Take 1 tablet (600 mg total) by mouth 3 (three) times daily.   glipiZIDE  (GLUCOTROL ) 10 MG tablet TAKE 1 TABLET(10 MG) BY MOUTH TWICE DAILY BEFORE A MEAL   ibuprofen (ADVIL) 200 MG tablet Take 200 mg by mouth every 6 (six) hours as needed.   insulin  degludec (TRESIBA  FLEXTOUCH) 100 UNIT/ML FlexTouch Pen ADMINISTER 40 UNITS UNDER THE SKIN EVERY DAY. OFFICE VISIT NEEDED FOR ADDITIONAL REFILLS   Insulin  Pen Needle (BD PEN NEEDLE NANO U/F) 32G X 4 MM MISC 1 each by Does not apply route daily.   MELATONIN PO Take 5 mg by mouth at bedtime as needed.   metFORMIN  (  GLUCOPHAGE ) 500 MG tablet TAKE 2 TABLETS(1000 MG) BY MOUTH TWICE DAILY WITH A MEAL   omeprazole  (PRILOSEC) 10 MG capsule TAKE 1 CAPSULE(10 MG) BY MOUTH DAILY   Vilazodone  HCl (VIIBRYD ) 40 MG TABS Take 1 tablet (40 mg total) by mouth daily.   ziprasidone  (GEODON ) 40 MG capsule Take 1 capsule (40 mg total) by mouth every evening.   ziprasidone  (GEODON ) 60 MG capsule Take 1 capsule (60 mg total) by mouth daily.   [DISCONTINUED] amoxicillin -clavulanate (AUGMENTIN ) 875-125 MG  tablet Take 1 tablet by mouth 2 (two) times daily.   No facility-administered encounter medications on file as of 09/24/2023.    Allergies (verified) Abilify [aripiprazole]   History: Past Medical History:  Diagnosis Date   Allergic rhinitis    Anxiety    Asthma    Bipolar disorder (HCC)    Depression    Diabetes mellitus without complication (HCC)    GERD (gastroesophageal reflux disease)    Hyperlipidemia    Hypertension    Mild sleep apnea    OCD (obsessive compulsive disorder)    Palpitations    Paranoia (HCC)    Schizoaffective disorder (HCC)    Sleep apnea    Past Surgical History:  Procedure Laterality Date   CESAREAN SECTION  2006/2008   CHOLECYSTECTOMY  2006   Family History  Problem Relation Age of Onset   Lupus Mother    Diabetes Mother        pre diabetes   Diabetes Father    Hyperlipidemia Father    Diabetes Brother    Breast cancer Maternal Grandmother    Depression Paternal Grandmother    Bipolar disorder Paternal Grandmother    Cancer - Colon Neg Hx    Social History   Socioeconomic History   Marital status: Married    Spouse name: Tabitha [name]   Number of children: 2   Years of education: 16   Highest education level: Bachelor's degree (e.g., BA, AB, BS)  Occupational History   Occupation: disability  Tobacco Use   Smoking status: Never   Smokeless tobacco: Never  Vaping Use   Vaping status: Never Used  Substance and Sexual Activity   Alcohol use: Not Currently   Drug use: Never   Sexual activity: Not Currently    Partners: Male    Birth control/protection: Surgical  Other Topics Concern   Not on file  Social History Narrative   Not on file   Social Drivers of Health   Financial Resource Strain: High Risk (09/24/2023)   Overall Financial Resource Strain (CARDIA)    Difficulty of Paying Living Expenses: Hard  Food Insecurity: Food Insecurity Present (09/24/2023)   Hunger Vital Sign    Worried About Running Out of Food in the  Last Year: Sometimes true    Ran Out of Food in the Last Year: Sometimes true  Transportation Needs: No Transportation Needs (09/24/2023)   PRAPARE - Administrator, Civil Service (Medical): No    Lack of Transportation (Non-Medical): No  Physical Activity: Inactive (09/24/2023)   Exercise Vital Sign    Days of Exercise per Week: 0 days    Minutes of Exercise per Session: 0 min  Stress: Stress Concern Present (09/24/2023)   [employer] of Occupational Health - Occupational Stress Questionnaire    Feeling of Stress : To some extent  Social Connections: Moderately Integrated (09/24/2023)   Social Connection and Isolation Panel [NHANES]    Frequency of Communication with Friends and Family: More than three  times a week    Frequency of Social Gatherings with Friends and Family: Once a week    Attends Religious Services: More than 4 times per year    Active Member of [employer] or Organizations: No    Attends Engineer, structural: Never    Marital Status: Married    Tobacco Counseling Counseling given: Not Answered    Clinical Intake:  Pre-visit preparation completed: Yes  Pain : No/denies pain     BMI - recorded: 43.9 Nutritional Status: BMI > 30  Obese Nutritional Risks: None Diabetes: Yes CBG done?: No Did pt. bring in CBG monitor from home?: No  Lab Results  Component Value Date   HGBA1C 7.9 (A) 12/30/2022   HGBA1C 8.8 (H) 06/23/2022   HGBA1C 9.0 (A) 03/23/2022     How often do you need to have someone help you when you read instructions, pamphlets, or other written materials from your doctor or pharmacy?: 1 - Never  Interpreter Needed?: No  Information entered by :: Tabitha Fergusson, Tabitha [name]   Activities of Daily Living    09/24/2023   11:04 AM 09/20/2023    6:04 PM  In your present state of health, do you have any difficulty performing the following activities:  Hearing? 0 0  Vision? 1 1  Difficulty concentrating or making decisions? 1 1   Walking or climbing stairs? 1 1  Dressing or bathing? 1 1  Doing errands, shopping? 1 1  Preparing Food and eating ? Y Y  Using the Toilet? N N  In the past six months, have you accidently leaked urine? Y Y  Do you have problems with loss of bowel control? N N  Managing your Medications? Y Y  Managing your Finances? Y Y  Housekeeping or managing your Housekeeping? Tabitha [name]    Patient Care Team: Carollynn Cirri, NP as PCP - General (Internal Medicine) [facility], [facility])  Indicate any recent Medical Services you may have received from other than [facility] providers in the past year (date may be approximate).     Assessment:    This is a routine wellness examination for Tabitha [name].  Hearing/Vision screen Hearing Screening - Comments:: NO AIDS Vision Screening - Comments:: WEARS GLASSES ALL DAY- [facility]   Goals Addressed             This Visit's Progress    DIET - REDUCE SUGAR INTAKE         Depression Screen     09/24/2023   11:00 AM 12/30/2022   10:33 AM 09/18/2022   11:07 AM 06/23/2022    9:58 AM 06/16/2022   10:59 AM 05/07/2022   11:14 AM 03/31/2022    8:43 AM  PHQ 2/9 Scores  PHQ - 2 Score 3 2 6 2   3   PHQ- 9 Score 6 13 9 9   12      Information is confidential and restricted. Go to Review Flowsheets to unlock data.    Fall Risk     09/24/2023   11:04 AM 09/20/2023    6:04 PM 12/30/2022   10:33 AM 09/18/2022   11:11 AM 09/18/2022    8:26 AM  Fall Risk   Falls in the past year? 0 0 0 0 0  Number falls in past yr: 0   0 0  Injury with Fall? 0  0 0 0  Risk for fall due to : No Fall Risks  No Fall Risks No Fall Risks   Follow  up Falls evaluation completed   Falls prevention discussed;Falls evaluation completed     MEDICARE RISK AT HOME:  Medicare Risk at Home Any stairs in or around the home?: Yes If so, are there any without handrails?: No Home free of loose throw rugs in walkways, pet beds, electrical cords, etc?: Yes Adequate lighting in your home  to reduce risk of falls?: Yes Life alert?: No Use of a cane, walker or w/c?: No Grab bars in the bathroom?: Yes Shower chair or bench in shower?: Yes Elevated toilet seat or a handicapped toilet?: No  TIMED UP AND GO:  Was the test performed?  No  Cognitive Function: 6CIT completed    09/11/2019    8:57 AM  MMSE - Mini Mental State Exam  Orientation to time 1  Orientation to Place 5  Registration 1  Attention/ Calculation 0  Recall 1  Language- name 2 objects 2  Language- repeat 1  Language- follow 3 step command 3  Language- read & follow direction 1  Write a sentence 1  Copy design 0  Total score 16        09/24/2023   11:05 AM 09/18/2022   11:17 AM 09/11/2021    1:22 PM 09/03/2020    2:49 PM 01/13/2018    2:46 PM  6CIT Screen  What Year? 0 points 0 points 0 points 0 points 0 points  What month? 3 points 0 points 0 points 0 points 0 points  What time? 3 points 0 points 0 points 0 points 0 points  Count back from 20 4 points 0 points 0 points 2 points 0 points  Months in reverse 4 points 0 points 0 points 4 points 0 points  Repeat phrase 2 points 0 points 0 points 10 points 0 points  Total Score 16 points 0 points 0 points 16 points 0 points    Immunizations Immunization History  Administered Date(s) Administered   Influenza, Seasonal, Injecte, Preservative Fre 12/30/2022   Influenza,inj,Quad PF,6+ Mos 01/16/2016, 01/13/2017, 01/13/2018, 01/07/2021, 12/16/2021   Influenza-Unspecified 02/07/2015   PNEUMOCOCCAL CONJUGATE-20 05/21/2021   Pneumococcal Polysaccharide-23 04/29/2015   Tdap 01/23/2015    Screening Tests Health Maintenance  Topic Date Due   COVID-19 Vaccine (1) Never done   MAMMOGRAM  Never done   Zoster Vaccines- Shingrix (1 of 2) Never done   OPHTHALMOLOGY EXAM  03/17/2023   FOOT EXAM  03/24/2023   HEMOGLOBIN A1C  06/29/2023   INFLUENZA VACCINE  11/26/2023   Diabetic kidney evaluation - Urine ACR  01/07/2024   Diabetic kidney evaluation - eGFR  measurement  08/29/2024   Medicare Annual Wellness (AWV)  09/23/2024   DTaP/Tdap/Td (2 - Td or Tdap) 01/22/2025   Fecal DNA (Cologuard)  07/05/2025   Cervical Cancer Screening (HPV/Pap Cotest)  06/24/2027   Pneumococcal Vaccine 16-76 Years old  Completed   Hepatitis C Screening  Completed   HPV VACCINES  Aged Out   Meningococcal B Vaccine  Aged Out   HIV Screening  Discontinued    Health Maintenance  Health Maintenance Due  Topic Date Due   COVID-19 Vaccine (1) Never done   MAMMOGRAM  Never done   Zoster Vaccines- Shingrix (1 of 2) Never done   OPHTHALMOLOGY EXAM  03/17/2023   FOOT EXAM  03/24/2023   HEMOGLOBIN A1C  06/29/2023   Health Maintenance Items Addressed: NEEDS SHINGRIX, DECLINES ALL COVID; DECLINES MAMMOGRAM;; UP TO DATE ON COLOGUARD; NEEDS EYE EXAM  Additional Screening:  Vision Screening: Recommended annual ophthalmology exams  for early detection of glaucoma and other disorders of the eye.  Dental Screening: Recommended annual dental exams for proper oral hygiene  Community Resource Referral / Chronic Care Management: CRR required this visit?  No   CCM required this visit?  No   Plan:    I have personally reviewed and noted the following in the patient's chart:   Medical and social history Use of alcohol, tobacco or illicit drugs  Current medications and supplements including opioid prescriptions. Patient is not currently taking opioid prescriptions. Functional ability and status Nutritional status Physical activity Advanced directives List of other physicians Hospitalizations, surgeries, and ER visits in previous 12 months Vitals Screenings to include cognitive, depression, and falls Referrals and appointments  In addition, I have reviewed and discussed with patient certain preventive protocols, quality metrics, and best practice recommendations. A written personalized care plan for preventive services as well as general preventive health  recommendations were provided to patient.   Tabitha Bright, Tabitha [name]   [id number]/41/[id number]   After Visit Summary: (MyChart) Due to this being a telephonic visit, the after visit summary with patients personalized plan was offered to patient via MyChart   Notes: Nothing significant to report at this time.

## 2023-09-24 NOTE — Patient Instructions (Addendum)
 Tabitha [name] , Thank you for taking time out of your busy schedule to complete your Annual Wellness Visit with me. I enjoyed our conversation and look forward to speaking with you again next year. I, as well as your care team,  appreciate your ongoing commitment to your health goals. Please review the following plan we discussed and let me know if I can assist you in the future.  Follow up Visits: Next Medicare AWV with our clinical staff:   09/29/24 @ 10:10 AM BY PHONE Have you seen your provider in the last 6 months (3 months if uncontrolled diabetes)? Yes  Clinician Recommendations:  Aim for 30 minutes of exercise or brisk walking, 6-8 glasses of water, and 5 servings of fruits and vegetables each day. TAKE CARE!      This is a list of the screening recommended for you and due dates:  Health Maintenance  Topic Date Due   COVID-19 Vaccine (1) Never done   Mammogram  Never done   Zoster (Shingles) Vaccine (1 of 2) Never done   Eye exam for diabetics  03/17/2023   Complete foot exam   03/24/2023   Hemoglobin A1C  06/29/2023   Flu Shot  11/26/2023   Yearly kidney health urinalysis for diabetes  01/07/2024   Yearly kidney function blood test for diabetes  08/29/2024   Medicare Annual Wellness Visit  09/23/2024   DTaP/Tdap/Td vaccine (2 - Td or Tdap) 01/22/2025   Cologuard (Stool DNA test)  07/05/2025   Pap with HPV screening  06/24/2027   Pneumococcal Vaccination  Completed   Hepatitis C Screening  Completed   HPV Vaccine  Aged Out   Meningitis B Vaccine  Aged Out   HIV Screening  Discontinued    Advanced directives: (ACP Link)Information on Advanced Care Planning can be found at [employer]  Secretary [employer] [facility] Advance Health Care Directives Advance Health Care Directives. [email or web address]  Advance Care Planning is important because it:  [x]  Makes sure you receive the medical care that is consistent with your values, goals, and preferences  [x]  It provides guidance to your family and loved  ones and reduces their decisional burden about whether or not they are making the right decisions based on your wishes.  Follow the link provided in your after visit summary or read over the paperwork we have mailed to you to help you started getting your Advance Directives in place. If you need assistance in completing these, please reach out to us  so that we can help you!

## 2023-09-27 ENCOUNTER — Other Ambulatory Visit: Payer: Self-pay | Admitting: Internal Medicine

## 2023-09-27 ENCOUNTER — Other Ambulatory Visit: Payer: Self-pay

## 2023-09-27 ENCOUNTER — Ambulatory Visit: Payer: Self-pay | Admitting: Internal Medicine

## 2023-09-27 DIAGNOSIS — E1142 Type 2 diabetes mellitus with diabetic polyneuropathy: Secondary | ICD-10-CM

## 2023-09-27 MED ORDER — EZETIMIBE 10 MG PO TABS: 10.0000 mg | ORAL_TABLET | Freq: Every day | ORAL | 0 refills | Status: DC

## 2023-09-27 MED ORDER — ACCU-CHEK GUIDE TEST VI STRP: 1.0000 | ORAL_STRIP | Freq: Three times a day (TID) | 0 refills | Status: DC | PRN

## 2023-10-05 ENCOUNTER — Other Ambulatory Visit: Payer: Self-pay | Admitting: Internal Medicine

## 2023-10-05 DIAGNOSIS — K219 Gastro-esophageal reflux disease without esophagitis: Secondary | ICD-10-CM

## 2023-10-06 NOTE — Telephone Encounter (Signed)
 Requested Requested Prescriptions  Pending Prescriptions Disp Refills   omeprazole  (PRILOSEC) 10 MG capsule [Pharmacy Med Name: OMEPRAZOLE  10MG  CAPSULES] 90 capsule 1    Sig: TAKE 1 CAPSULE(10 MG) BY MOUTH DAILY     Gastroenterology: Proton Pump Inhibitors Passed - 10/06/2023  8:09 AM      Passed - Valid encounter within last 12 months    Recent Outpatient Visits           1 week ago Encounter for general adult medical examination with abnormal findings   [facility] [place], Rankin Buzzard, NP                by interface surescripts.

## 2023-10-08 ENCOUNTER — Encounter: Payer: Self-pay | Admitting: Internal Medicine

## 2023-10-08 ENCOUNTER — Ambulatory Visit (INDEPENDENT_AMBULATORY_CARE_PROVIDER_SITE_OTHER): Admitting: Internal Medicine

## 2023-10-08 VITALS — BP 138/78 | Ht 62.0 in | Wt 241.0 lb

## 2023-10-08 DIAGNOSIS — Z7984 Long term (current) use of oral hypoglycemic drugs: Secondary | ICD-10-CM | POA: Diagnosis not present

## 2023-10-08 DIAGNOSIS — I152 Hypertension secondary to endocrine disorders: Secondary | ICD-10-CM

## 2023-10-08 DIAGNOSIS — E1159 Type 2 diabetes mellitus with other circulatory complications: Secondary | ICD-10-CM

## 2023-10-08 NOTE — Progress Notes (Signed)
 Subjective:    Patient ID: Tabitha [name], female    DOB: 1970/08/13, 53 y.o.   MRN: [id number]  HPI  Patient presents to clinic today for 2-week follow-up of HTN.  At her last visit, she was started on olmesartan  20 mg daily.  She has been taking the medication as prescribed.  Her BP today is 138/78.  ECG from 08/2023 reviewed.  Review of Systems     Past Medical History:  Diagnosis Date   Allergic rhinitis    Anxiety    Asthma    Bipolar disorder (HCC)    Depression    Diabetes mellitus without complication (HCC)    GERD (gastroesophageal reflux disease)    Hyperlipidemia    Hypertension    Mild sleep apnea    OCD (obsessive compulsive disorder)    Palpitations    Paranoia (HCC)    Schizoaffective disorder (HCC)    Sleep apnea     Current Outpatient Medications  Medication Sig Dispense Refill   albuterol  (VENTOLIN  HFA) 108 (90 Base) MCG/ACT inhaler Inhale 2 puffs into the lungs every 6 (six) hours as needed for wheezing or shortness of breath. 25.5 g 3   atorvastatin  (LIPITOR) 40 MG tablet TAKE 1 TABLET(40 MG) BY MOUTH DAILY 90 tablet 3   divalproex  (DEPAKOTE  ER) 500 MG 24 hr tablet Take 2 tablets (1,000 mg total) by mouth daily. 180 tablet 0   EPINEPHrine  0.3 mg/0.3 mL IJ SOAJ injection Inject 0.3 mg into the muscle as needed for anaphylaxis. 1 each 0   ezetimibe  (ZETIA ) 10 MG tablet Take 1 tablet (10 mg total) by mouth daily. 90 tablet 0   fluticasone  (FLONASE ) 50 MCG/ACT nasal spray Place 2 sprays into both nostrils daily.     fluticasone  (FLOVENT  HFA) 110 MCG/ACT inhaler Inhale 1 puff into the lungs 2 (two) times daily. 1 Inhaler 11   gabapentin  (NEURONTIN ) 600 MG tablet Take 1 tablet (600 mg total) by mouth 3 (three) times daily. 270 tablet 0   glipiZIDE  (GLUCOTROL ) 10 MG tablet TAKE 1 TABLET(10 MG) BY MOUTH TWICE DAILY BEFORE A MEAL 180 tablet 1   glucose blood (ACCU-CHEK GUIDE TEST) test strip 1 each by Other route 3 (three) times daily as needed for other. Use  as instructed 200 strip 0   ibuprofen (ADVIL) 200 MG tablet Take 200 mg by mouth every 6 (six) hours as needed.     insulin  degludec (TRESIBA  FLEXTOUCH) 100 UNIT/ML FlexTouch Pen ADMINISTER 40 UNITS UNDER THE SKIN EVERY DAY. OFFICE VISIT NEEDED FOR ADDITIONAL REFILLS 15 mL 0   Insulin  Pen Needle (BD PEN NEEDLE NANO U/F) 32G X 4 MM MISC 1 each by Does not apply route daily. 100 each 2   MELATONIN PO Take 5 mg by mouth at bedtime as needed.     metFORMIN  (GLUCOPHAGE ) 500 MG tablet TAKE 2 TABLETS(1000 MG) BY MOUTH TWICE DAILY WITH A MEAL 120 tablet 0   olmesartan  (BENICAR ) 20 MG tablet Take 1 tablet (20 mg total) by mouth daily. 90 tablet 1   omeprazole  (PRILOSEC) 10 MG capsule TAKE 1 CAPSULE(10 MG) BY MOUTH DAILY 90 capsule 1   Vilazodone  HCl (VIIBRYD ) 40 MG TABS Take 1 tablet (40 mg total) by mouth daily. 90 tablet 1   ziprasidone  (GEODON ) 40 MG capsule Take 1 capsule (40 mg total) by mouth every evening. 90 capsule 0   ziprasidone  (GEODON ) 60 MG capsule Take 1 capsule (60 mg total) by mouth daily. 90 capsule 0   No  current facility-administered medications for this visit.    Allergies  Allergen Reactions   Abilify [Aripiprazole] Hives    Family History  Problem Relation Age of Onset   Lupus Mother    Diabetes Mother        pre diabetes   Diabetes Father    Hyperlipidemia Father    Diabetes Brother    Breast cancer Maternal Grandmother    Depression Paternal Grandmother    Bipolar disorder Paternal Grandmother    Cancer - Colon Neg Hx     Social History   Socioeconomic History   Marital status: Married    Spouse name: Rashanda Magloire   Number of children: 2   Years of education: 16   Highest education level: Bachelor's degree (e.g., BA, AB, BS)  Occupational History   Occupation: disability  Tobacco Use   Smoking status: Never   Smokeless tobacco: Never  Vaping Use   Vaping status: Never Used  Substance and Sexual Activity   Alcohol use: Not Currently   Drug use: Never    Sexual activity: Not Currently    Partners: Male    Birth control/protection: Surgical  Other Topics Concern   Not on file  Social History Narrative   Not on file   Social Drivers of Health   Financial Resource Strain: Medium Risk (10/07/2023)   Overall Financial Resource Strain (CARDIA)    Difficulty of Paying Living Expenses: Somewhat hard  Food Insecurity: Food Insecurity Present (10/07/2023)   Hunger Vital Sign    Worried About Running Out of Food in the Last Year: Sometimes true    Ran Out of Food in the Last Year: Sometimes true  Transportation Needs: No Transportation Needs (10/07/2023)   PRAPARE - Administrator, Civil Service (Medical): No    Lack of Transportation (Non-Medical): No  Physical Activity: Inactive (10/07/2023)   Exercise Vital Sign    Days of Exercise per Week: 0 days    Minutes of Exercise per Session: Not on file  Stress: Stress Concern Present (10/07/2023)   [employer] of Occupational Health - Occupational Stress Questionnaire    Feeling of Stress: Rather much  Social Connections: Socially Integrated (10/07/2023)   Social Connection and Isolation Panel    Frequency of Communication with Friends and Family: Twice a week    Frequency of Social Gatherings with Friends and Family: Once a week    Attends Religious Services: More than 4 times per year    Active Member of [employer] or Organizations: Yes    Attends Engineer, structural: More than 4 times per year    Marital Status: Married  Catering manager Violence: Not At Risk (09/24/2023)   Humiliation, Afraid, Rape, and Kick questionnaire    Fear of Current or Ex-Partner: No    Emotionally Abused: No    Physically Abused: No    Sexually Abused: No     Constitutional: Patient reports intermittent headaches.  Denies fever, malaise, fatigue, or abrupt weight changes.  HEENT: Denies eye pain, eye redness, ear pain, ringing in the ears, wax buildup, runny nose, nasal congestion, bloody  nose, or sore throat. Respiratory: Denies difficulty breathing, shortness of breath, cough or sputum production.   Cardiovascular: Denies chest pain, chest tightness, palpitations or swelling in the hands or feet.  Musculoskeletal: Denies decrease in range of motion, difficulty with gait, muscle pain or joint pain and swelling.  Skin: Denies redness, rashes, lesions or ulcercations.  Neurological: Pt reports neuropathic pain. Denies dizziness, difficulty  with memory, difficulty with speech or problems with balance and coordination.    No other specific complaints in a complete review of systems (except as listed in HPI above).  Objective:   Physical Exam   BP 138/78 (BP Location: Left Arm, Patient Position: Sitting, Cuff Size: Large)   Ht 5' 2 (1.575 m)   Wt 241 lb (109.3 kg)   LMP 08/03/2015   BMI 44.08 kg/m     Wt Readings from Last 3 Encounters:  09/24/23 240 lb (108.9 kg)  05/06/23 240 lb (108.9 kg)  12/30/22 244 lb (110.7 kg)    General: Appears her stated age, obese, in NAD. Cardiovascular: Normal rate and rhythm. S1,S2 noted.  No murmur, rubs or gallops noted. No JVD or BLE edema.  Pulmonary/Chest: Normal effort and positive vesicular breath sounds. No respiratory distress. No wheezes, rales or ronchi noted.  Musculoskeletal:  No difficulty with gait.  Neurological: Alert and oriented.  Coordination normal.    BMET    Component Value Date/Time   NA 135 09/24/2023 1342   NA 137 09/07/2019 1657   K 4.8 09/24/2023 1342   CL 98 09/24/2023 1342   CO2 21 09/24/2023 1342   GLUCOSE 319 (H) 09/24/2023 1342   BUN 13 09/24/2023 1342   BUN 14 09/07/2019 1657   CREATININE 0.64 09/24/2023 1342   CALCIUM  9.6 09/24/2023 1342   GFRNONAA >60 08/30/2023 1542   GFRAA 88 09/07/2019 1657    Lipid Panel     Component Value Date/Time   CHOL 290 (H) 09/24/2023 1342   CHOL 138 09/07/2019 1657   CHOL 176 04/23/2017 1341   TRIG 817 (H) 09/24/2023 1342   TRIG 253 (H) 04/23/2017  1341   HDL 53 09/24/2023 1342   HDL 55 09/07/2019 1657   CHOLHDL 5.5 (H) 09/24/2023 1342   VLDL 51 (H) 04/23/2017 1341   LDLCALC  09/24/2023 1342     Comment:     . LDL cholesterol not calculated. Triglyceride levels greater than 400 mg/dL invalidate calculated LDL results. . Reference range: <100 . Desirable range <100 mg/dL for primary prevention;   <70 mg/dL for patients with CHD or diabetic patients  with > or = 2 CHD risk factors. Aaron Aas LDL-C is now calculated using the Martin-Hopkins  calculation, which is a validated novel method providing  better accuracy than the Friedewald equation in the  estimation of LDL-C.  Melinda Sprawls et al. Erroll Heard. 1610;960(45): 2061-2068  (http://education.[facility].com/faq/FAQ164)     CBC    Component Value Date/Time   WBC 5.6 09/24/2023 1342   RBC 4.25 09/24/2023 1342   HGB 12.8 09/24/2023 1342   HGB 13.3 04/28/2018 1604   HCT 38.5 09/24/2023 1342   HCT 39.6 04/28/2018 1604   PLT 188 09/24/2023 1342   PLT 201 04/28/2018 1604   MCV 90.6 09/24/2023 1342   MCV 86 04/28/2018 1604   MCH 30.1 09/24/2023 1342   MCHC 33.2 09/24/2023 1342   RDW 13.7 09/24/2023 1342   RDW 15.1 04/28/2018 1604   LYMPHSABS 2,690 01/07/2023 1058   LYMPHSABS 2.4 04/28/2018 1604   MONOABS 378 09/26/2015 1154   EOSABS 158 01/07/2023 1058   EOSABS 0.1 04/28/2018 1604   BASOSABS 32 01/07/2023 1058   BASOSABS 0.0 04/28/2018 1604    Hgb A1C Lab Results  Component Value Date   HGBA1C 9.3 (H) 09/24/2023           Assessment & Plan:     RTC in 3 months, follow-up chronic conditions [facility] Record  Thalia Filler, NP

## 2023-10-08 NOTE — Assessment & Plan Note (Signed)
 Improved control on olmesartan  20 mg daily Reinforced DASH diet and exercise for weight loss

## 2023-10-08 NOTE — Patient Instructions (Signed)
 Hypertension, Adult Hypertension is another name for high blood pressure. High blood pressure forces your heart to work harder to pump blood. This can cause problems over time. There are two numbers in a blood pressure reading. There is a top number (systolic) over a bottom number (diastolic). It is best to have a blood pressure that is below 120/80. What are the causes? The cause of this condition is not known. Some other conditions can lead to high blood pressure. What increases the risk? Some lifestyle factors can make you more likely to develop high blood pressure: Smoking. Not getting enough exercise or physical activity. Being overweight. Having too much fat, sugar, calories, or salt (sodium) in your diet. Drinking too much alcohol. Other risk factors include: Having any of these conditions: Heart disease. Diabetes. High cholesterol. Kidney disease. Obstructive sleep apnea. Having a family history of high blood pressure and high cholesterol. Age. The risk increases with age. Stress. What are the signs or symptoms? High blood pressure may not cause symptoms. Very high blood pressure (hypertensive crisis) may cause: Headache. Fast or uneven heartbeats (palpitations). Shortness of breath. Nosebleed. Vomiting or feeling like you may vomit (nauseous). Changes in how you see. Very bad chest pain. Feeling dizzy. Seizures. How is this treated? This condition is treated by making healthy lifestyle changes, such as: Eating healthy foods. Exercising more. Drinking less alcohol. Your doctor may prescribe medicine if lifestyle changes do not help enough and if: Your top number is above 130. Your bottom number is above 80. Your personal target blood pressure may vary. Follow these instructions at home: Eating and drinking  If told, follow the DASH eating plan. To follow this plan: Fill one half of your plate at each meal with fruits and vegetables. Fill one fourth of your plate  at each meal with whole grains. Whole grains include whole-wheat pasta, brown rice, and whole-grain bread. Eat or drink low-fat dairy products, such as skim milk or low-fat yogurt. Fill one fourth of your plate at each meal with low-fat (lean) proteins. Low-fat proteins include fish, chicken without skin, eggs, beans, and tofu. Avoid fatty meat, cured and processed meat, or chicken with skin. Avoid pre-made or processed food. Limit the amount of salt in your diet to less than 1,500 mg each day. Do not drink alcohol if: Your doctor tells you not to drink. You are pregnant, may be pregnant, or are planning to become pregnant. If you drink alcohol: Limit how much you have to: 0-1 drink a day for women. 0-2 drinks a day for men. Know how much alcohol is in your drink. In the [place] one drink equals one 12 oz bottle of beer (355 mL), one 5 oz glass of wine (148 mL), or one 1 oz glass of hard liquor (44 mL). Lifestyle  Work with your doctor to stay at a healthy weight or to lose weight. Ask your doctor what the best weight is for you. Get at least 30 minutes of exercise that causes your heart to beat faster (aerobic exercise) most days of the week. This may include walking, swimming, or biking. Get at least 30 minutes of exercise that strengthens your muscles (resistance exercise) at least 3 days a week. This may include lifting weights or doing Pilates. Do not smoke or use any products that contain nicotine or tobacco. If you need help quitting, ask your doctor. Check your blood pressure at home as told by your doctor. Keep all follow-up visits. Medicines Take over-the-counter and prescription medicines  only as told by your doctor. Follow directions carefully. Do not skip doses of blood pressure medicine. The medicine does not work as well if you skip doses. Skipping doses also puts you at risk for problems. Ask your doctor about side effects or reactions to medicines that you should watch  for. Contact a doctor if: You think you are having a reaction to the medicine you are taking. You have headaches that keep coming back. You feel dizzy. You have swelling in your ankles. You have trouble with your vision. Get help right away if: You get a very bad headache. You start to feel mixed up (confused). You feel weak or numb. You feel faint. You have very bad pain in your: Chest. Belly (abdomen). You vomit more than once. You have trouble breathing. These symptoms may be an emergency. Get help right away. Call 911. Do not wait to see if the symptoms will go away. Do not drive yourself to the hospital. Summary Hypertension is another name for high blood pressure. High blood pressure forces your heart to work harder to pump blood. For most people, a normal blood pressure is less than 120/80. Making healthy choices can help lower blood pressure. If your blood pressure does not get lower with healthy choices, you may need to take medicine. This information is not intended to replace advice given to you by your health care provider. Make sure you discuss any questions you have with your health care provider. Document Revised: 01/30/2021 Document Reviewed: 01/30/2021 Elsevier Patient Education  2024 [employer].

## 2023-10-10 NOTE — Progress Notes (Signed)
 [facility] MD/PA/NP OP Progress Note  10/14/2023 3:12 PM Tabitha [name]  MRN:  [id number]  Chief Complaint:  Chief Complaint  Patient presents with   Follow-up   HPI:  - since the last visit, olmesartan  was started by Angeline Laura, NP  This is a follow-up appointment for schizoaffective disorder, anxiety, OCD.  She states that she feels anxious and irritable.  She feels like she is going to explode. She denies HI or any violence.  She does not feel like herself.  She feels people are watching her.  She states that she was feeling very uncomfortable in the waiting room when other people were talking to her.  She feels comfortable with her husband.  She has been feeling depressed.  She does not feel comfortable in her skin.  She feels like something bad is going to happen.  She has been overeating as she cannot get full.  They have been trying to find a different church.  The people at the previous church has found that her husband is on the list, and he did not feel comfortable going there.  She denies SI.  She has AH of kids or husband following.  She has VH of seeing mouse.  she reports less drowsiness from last visit.  She sleeps up to 8 hours and feels tired.  She agrees with the plans as outlined below.    Wt Readings from Last 3 Encounters:  10/14/23 241 lb 9.6 oz (109.6 kg)  10/08/23 241 lb (109.3 kg)  09/24/23 240 lb (108.9 kg)    02/25/23 245 lb 6.4 oz (111.3 kg)    Daily routine: house chores, goes to church weekly.  Exercise: Employment: unemployed, on disability due to paranoia (could not keep her job due to paranoia that people coming to her workplace). used to work at [employer] until 2007 (quit due to pregnancy) Support: husband, 2, son, parents Household: husband (unemployed, had gas gangrene in his right side extremities s/p bilateral amputation, adl independent), 2 sons Marital status: married Number of children: 2 sons Education: college, majored in Psychologist, educational, music  Visit  Diagnosis:    ICD-10-CM   1. Obsessive-compulsive disorder, unspecified type  F42.9     2. Schizoaffective disorder, depressive type (HCC)  F25.1 gabapentin  (NEURONTIN ) 600 MG tablet    3. Anxiety state  F41.1     4. High risk medication use  Z79.899       Past Psychiatric History: Please see initial evaluation for full details. I have reviewed the history. No updates at this time.     Past Medical History:  Past Medical History:  Diagnosis Date   Allergic rhinitis    Anxiety    Arthritis 2019   Asthma    Bipolar disorder (HCC)    Depression    Diabetes mellitus without complication (HCC)    GERD (gastroesophageal reflux disease)    Hyperlipidemia    Hypertension    Mild sleep apnea    OCD (obsessive compulsive disorder)    Palpitations    Paranoia (HCC)    Schizoaffective disorder (HCC)    Sleep apnea     Past Surgical History:  Procedure Laterality Date   CESAREAN SECTION  2006/2008   CHOLECYSTECTOMY  04/27/2004   TUBAL LIGATION  2008    Family Psychiatric History: Please see initial evaluation for full details. I have reviewed the history. No updates at this time.     Family History:  Family History  Problem Relation Age of Onset  Lupus Mother    Diabetes Mother        pre diabetes   Diabetes Father    Hyperlipidemia Father    Diabetes Brother    Breast cancer Maternal Grandmother    Depression Paternal Grandmother    Bipolar disorder Paternal Grandmother    Cancer - Colon Neg Hx     Social History:  Social History   Socioeconomic History   Marital status: Married    Spouse name: Tabitha [name]   Number of children: 2   Years of education: 16   Highest education level: Bachelor's degree (e.g., BA, AB, BS)  Occupational History   Occupation: disability  Tobacco Use   Smoking status: Never   Smokeless tobacco: Never  Vaping Use   Vaping status: Never Used  Substance and Sexual Activity   Alcohol use: Not Currently   Drug use: Never    Sexual activity: Not Currently    Partners: Male    Birth control/protection: Surgical  Other Topics Concern   Not on file  Social History Narrative   Not on file   Social Drivers of Health   Financial Resource Strain: Medium Risk (10/07/2023)   Overall Financial Resource Strain (CARDIA)    Difficulty of Paying Living Expenses: Somewhat hard  Food Insecurity: Food Insecurity Present (10/07/2023)   Hunger Vital Sign    Worried About Running Out of Food in the Last Year: Sometimes true    Ran Out of Food in the Last Year: Sometimes true  Transportation Needs: No Transportation Needs (10/07/2023)   PRAPARE - Administrator, Civil Service (Medical): No    Lack of Transportation (Non-Medical): No  Physical Activity: Inactive (10/07/2023)   Exercise Vital Sign    Days of Exercise per Week: 0 days    Minutes of Exercise per Session: Not on file  Stress: Stress Concern Present (10/07/2023)   [employer] of Occupational Health - Occupational Stress Questionnaire    Feeling of Stress: Rather much  Social Connections: Socially Integrated (10/07/2023)   Social Connection and Isolation Panel    Frequency of Communication with Friends and Family: Twice a week    Frequency of Social Gatherings with Friends and Family: Once a week    Attends Religious Services: More than 4 times per year    Active Member of [employer] or Organizations: Yes    Attends Engineer, structural: More than 4 times per year    Marital Status: Married    Allergies:  Allergies  Allergen Reactions   Abilify [Aripiprazole] Hives    Metabolic Disorder Labs: Lab Results  Component Value Date   HGBA1C 9.3 (H) 09/24/2023   MPG 220 09/24/2023   MPG 206 06/23/2022   Lab Results  Component Value Date   PROLACTIN 16.3 08/30/2023   PROLACTIN 37.8 (H) 01/22/2022   Lab Results  Component Value Date   CHOL 290 (H) 09/24/2023   TRIG 817 (H) 09/24/2023   HDL 53 09/24/2023   CHOLHDL 5.5 (H) 09/24/2023    VLDL 51 (H) 04/23/2017   LDLCALC  09/24/2023     Comment:     . LDL cholesterol not calculated. Triglyceride levels greater than 400 mg/dL invalidate calculated LDL results. . Reference range: <100 . Desirable range <100 mg/dL for primary prevention;   <70 mg/dL for patients with CHD or diabetic patients  with > or = 2 CHD risk factors. SABRA LDL-C is now calculated using the Martin-Hopkins  calculation, which is a validated novel method  providing  better accuracy than the Friedewald equation in the  estimation of LDL-C.  Gladis APPLETHWAITE et al. SANDREA. 7986;689(80): 2061-2068  (http://education.Quest[employer].com/faq/FAQ164)    LDLCALC 52 01/07/2023   Lab Results  Component Value Date   TSH 1.026 01/22/2022   TSH 1.56 12/17/2021    Therapeutic Level Labs: No results found for: LITHIUM Lab Results  Component Value Date   VALPROATE 81 08/30/2023   VALPROATE 34 (L) 07/21/2023   No results found for: CBMZ  Current Medications: Current Outpatient Medications  Medication Sig Dispense Refill   albuterol  (VENTOLIN  HFA) 108 (90 Base) MCG/ACT inhaler Inhale 2 puffs into the lungs every 6 (six) hours as needed for wheezing or shortness of breath. 25.5 g 3   atorvastatin  (LIPITOR) 40 MG tablet TAKE 1 TABLET(40 MG) BY MOUTH DAILY 90 tablet 3   EPINEPHrine  0.3 mg/0.3 mL IJ SOAJ injection Inject 0.3 mg into the muscle as needed for anaphylaxis. 1 each 0   ezetimibe  (ZETIA ) 10 MG tablet Take 1 tablet (10 mg total) by mouth daily. 90 tablet 0   fluticasone  (FLONASE ) 50 MCG/ACT nasal spray Place 2 sprays into both nostrils daily.     fluticasone  (FLOVENT  HFA) 110 MCG/ACT inhaler Inhale 1 puff into the lungs 2 (two) times daily. 1 Inhaler 11   glipiZIDE  (GLUCOTROL ) 10 MG tablet TAKE 1 TABLET BY MOUTH TWICE DAILY 180 tablet 1   glucose blood (ACCU-CHEK GUIDE TEST) test strip 1 each by Other route 3 (three) times daily as needed for other. Use as instructed 200 strip 0   ibuprofen (ADVIL) 200  MG tablet Take 200 mg by mouth every 6 (six) hours as needed.     insulin  degludec (TRESIBA  FLEXTOUCH) 100 UNIT/ML FlexTouch Pen ADMINISTER 40 UNITS UNDER THE SKIN EVERY DAY. OFFICE VISIT NEEDED FOR ADDITIONAL REFILLS 15 mL 0   Insulin  Pen Needle (BD PEN NEEDLE NANO U/F) 32G X 4 MM MISC 1 each by Does not apply route daily. 100 each 2   MELATONIN PO Take 5 mg by mouth at bedtime as needed.     metFORMIN  (GLUCOPHAGE ) 500 MG tablet TAKE 2 TABLETS(1000 MG) BY MOUTH TWICE DAILY WITH A MEAL 120 tablet 0   olmesartan  (BENICAR ) 20 MG tablet Take 1 tablet (20 mg total) by mouth daily. 90 tablet 1   omeprazole  (PRILOSEC) 10 MG capsule TAKE 1 CAPSULE(10 MG) BY MOUTH DAILY 90 capsule 1   Vilazodone  HCl (VIIBRYD ) 40 MG TABS Take 1 tablet (40 mg total) by mouth daily. 90 tablet 1   ziprasidone  (GEODON ) 60 MG capsule Take 1 capsule (60 mg total) by mouth daily. 90 capsule 0   ziprasidone  (GEODON ) 60 MG capsule Take 1 capsule (60 mg total) by mouth 2 (two) times daily with a meal. 180 capsule 0   [START ON 10/20/2023] divalproex  (DEPAKOTE  ER) 500 MG 24 hr tablet Take 2 tablets (1,000 mg total) by mouth daily. 180 tablet 0   [START ON 11/19/2023] gabapentin  (NEURONTIN ) 600 MG tablet Take 1 tablet (600 mg total) by mouth 3 (three) times daily. 270 tablet 0   ziprasidone  (GEODON ) 40 MG capsule Take 1 capsule (40 mg total) by mouth every evening. (Patient not taking: Reported on 10/14/2023) 90 capsule 0   No current facility-administered medications for this visit.     Musculoskeletal: Strength & Muscle Tone: within normal limits Gait & Station: normal Patient leans: N/A  Psychiatric Specialty Exam: Review of Systems  Psychiatric/Behavioral:  Positive for dysphoric mood, hallucinations and sleep disturbance. Negative for  agitation, behavioral problems, confusion, decreased concentration, self-injury and suicidal ideas. The patient is nervous/anxious. The patient is not hyperactive.   All other systems reviewed  and are negative.   Blood pressure 132/78, pulse (!) 107, temperature 98.4 F (36.9 C), temperature source Temporal, height 5' 2 (1.575 m), weight 241 lb 9.6 oz (109.6 kg), last menstrual period 08/03/2015, SpO2 97%.Body mass index is 44.19 kg/m.  General Appearance: Well Groomed  Eye Contact:  Good  Speech:  Clear and Coherent  Volume:  Normal  Mood:  Anxious  Affect:  Appropriate, Congruent, and Restricted  Thought Process:  Coherent  Orientation:  Full (Time, Place, and Person)  Thought Content: Paranoid Ideation   Suicidal Thoughts:  No  Homicidal Thoughts:  No  Memory:  Immediate;   Good  Judgement:  Good  Insight:  Good  Psychomotor Activity:  Normal, Normal tone, no rigidity, no resting/postural tremors, no tardive dyskinesia    Concentration:  Concentration: Good and Attention Span: Good  Recall:  Good  Fund of Knowledge: Good  Language: Good  Akathisia:  No  Handed:  Right  AIMS (if indicated): 0   Assets:  Communication Skills Desire for Improvement  ADL's:  Intact  Cognition: WNL  Sleep:  slight hypersomnia   Screenings: AIMS    Flowsheet Row Office Visit from 05/12/2017 in [facility] Office Visit from 07/29/2016 in [facility] from 05/06/2016 [facility] Office Visit from 04/01/2016 in [facility] Office Visit from 03/28/2015 in [facility]  AIMS Total Score 0 0 0 0 0   GAD-7    Flowsheet Row Office Visit from 10/08/2023 in [facility] Office Visit from 09/24/2023 in [facility] Office Visit from 12[facility]30/2022 in [facility] Office Visit from 06/23/2022 in [facility] Office Visit from 06/16/2022 in [facility]  Total GAD-7 Score 16 19 14 5 6    Mini-Mental    Flowsheet Row Office Visit from 09/07/2019 in [facility]  Total Score (max 30 points ) 16   PHQ2-9    Flowsheet Row Office Visit from 10/08/2023 in [facility] Most recent reading at 10/08/2023 11:17 AM Office Visit from 09/24/2023 in [facility] Most recent reading at 09/24/2023  1:27 PM Clinical Support from 09/24/2023 in [facility] Most recent reading at 09/24/2023 11:00 AM Office Visit from 12/30/2022 in [facility] Most recent reading at 12/30/2022 10:33 AM Clinical Support from 09/18/2022 in [facility] Most recent reading at 09/18/2022 11:07 AM  PHQ-2 Total Score 4 6 3 2 6   PHQ-9 Total Score 16 23 6 13 9    Flowsheet Row Office Visit from 06/16/2022 in [facility] Office Visit from 05/07/2022 in [facility] Office Visit from 01/22/2022 in [facility]  C-SSRS RISK CATEGORY No Risk No Risk No Risk     Assessment and Plan:  Tabitha [name] is a 53 y.o. year old female with a history of schizoaffective disorder,  sleep apnea (using CPAP machine), diabetes, hypertension, hyperlipidemia, GERD, who presents for follow up appointment for below.   1. Schizoaffective disorder, depressive type (  HCC) 2. Obsessive-compulsive disorder, unspecified type 3. Anxiety state Other stressors include: her husband being registered as a sex offender    History: Tx from Dr. Calhoun. no psych admission. Originally on Invega  234 mg IM. Perphenazine  6 mg daily, viibryd  40 mg daily, amantadine  100 mg BID, gabapentin  600 mg TID. Unable to continue injection since May due to financial strain   The exam is notable for anxious affect, and she continues to experience paranoia,  which has been affecting her daily functioning/going outside.  Will titrate ziprasidone  to optimize treatment for schizoaffective disorder/psychotic symptoms.  Discussed potential metabolic side effect, QTc prolongation and EPS.  Noted that she may be experiencing akathisia; will continue to closely monitor this.  She was advised to contact the office if any worsening in his symptoms after uptitration although ziprasidone .  Will continue Depakote  to target schizoaffective disorder; she reports less drowsiness since consolidation of the dose.  Will continue Viibryd  for depression, and gabapentin  for anxiety.   4. High risk medication use  EKG, labs are reviewed and discussed with the patient     Last checked  EKG HR 74 , QTc (corrected QtcH 445 msec 08/2023  Lipid panels Chol 290 H, TG 817 H 08/2023  HbA1c 9.3 On metformin  08/2023    VPA 81, LFT wnl, Plt wnl 08/2023, Prolactin 16.3 on 08/2023 - improved Na 08/2023 - improved   # hyperglycemia She continues to have hyperglycemia.  She was advised to follow up with her primary care provider for further evaluation/treatment.  Will continue to closely monitor, as she is currently on ziprasidone --though this medication is generally associated with fewer metabolic side effects, including less impact on glycemic control.   Plan Increase Geodon  60 mg twice a day (uptitrated from 60 mg in am, 40 mg in pm, 09/2023) - monitor akathisia Continue Depakote  1000 mg daily  Continue Viibryd  40 mg daily Continue gabapentin  600 mg 3 times a day Next appointment- 7/30 at 9:30, video, waitlist for sooner appointment She was advised to have sooner follow up with her primary care - She [facility] - on metformin    Past trials of medication: Abilify (hives), olanzapine  (hives from 7.5 mg, quetiapine, risperidone (stopped working),    The patient demonstrates the following risk factors for suicide: Chronic risk factors for suicide  include: psychiatric disorder of schizoaffective disorder. Acute risk factors for suicide include: unemployment. Protective factors for this patient include: positive social support, responsibility to others (children, family), hope for the future and religious beliefs against suicide. Considering these factors, the overall suicide risk at this point appears to be low. Patient is appropriate for outpatient follow up.  Although she has guns at home, she does not have access to them.      Collaboration of Care: Collaboration of Care: Other reviewed notes in Epic  Patient/Guardian was advised Release of Information must be obtained prior to any record release in order to collaborate their care with an outside provider. Patient/Guardian was advised if they have not already done so to contact the registration department to sign all necessary forms in order for us  to release information regarding their care.   Consent: Patient/Guardian gives verbal consent for treatment and assignment of benefits for services provided during this visit. Patient/Guardian expressed understanding and agreed to proceed.    Katheren Sleet, MD 10/14/2023, 3:12 PM

## 2023-10-11 ENCOUNTER — Other Ambulatory Visit: Payer: Self-pay | Admitting: Internal Medicine

## 2023-10-11 DIAGNOSIS — Z794 Long term (current) use of insulin: Secondary | ICD-10-CM

## 2023-10-13 NOTE — Telephone Encounter (Signed)
 Requested Prescriptions  Pending Prescriptions Disp Refills   glipiZIDE  (GLUCOTROL ) 10 MG tablet [Pharmacy Med Name: GLIPIZIDE  10MG  TABLETS] 180 tablet 1    Sig: TAKE 1 TABLET BY MOUTH TWICE DAILY     Endocrinology:  Diabetes - Sulfonylureas Failed - 10/13/2023 10:46 AM      Failed - HBA1C is between 0 and 7.9 and within 180 days    HB A1C (BAYER DCA - WAIVED)  Date Value Ref Range Status  10/22/2017 6.9 <7.0 % Final    Comment:                                          Diabetic Adult            <7.0                                       Healthy Adult        4.3 - 5.7                                                           (DCCT/NGSP) American Diabetes Association's Summary of Glycemic Recommendations for Adults with Diabetes: Hemoglobin A1c <7.0%. More stringent glycemic goals (A1c <6.0%) may further reduce complications at the cost of increased risk of hypoglycemia.    Hgb A1c MFr Bld  Date Value Ref Range Status  09/24/2023 9.3 (H) <5.7 % Final    Comment:    For someone without known diabetes, a hemoglobin A1c value of 6.5% or greater indicates that they may have  diabetes and this should be confirmed with a follow-up  test. . For someone with known diabetes, a value <7% indicates  that their diabetes is well controlled and a value  greater than or equal to 7% indicates suboptimal  control. A1c targets should be individualized based on  duration of diabetes, age, comorbid conditions, and  other considerations. . Currently, no consensus exists regarding use of hemoglobin A1c for diagnosis of diabetes for children. .          Passed - Cr in normal range and within 360 days    Creat  Date Value Ref Range Status  09/24/2023 0.64 0.50 - 1.03 mg/dL Final   Creatinine, Urine  Date Value Ref Range Status  01/07/2023 14 (L) 20 - 275 mg/dL Final         Passed - Valid encounter within last 6 months    Recent Outpatient Visits           5 days ago Hypertension  associated with diabetes [facility])   [place] [facility] [place], Rankin Buzzard, NP   2 weeks ago Encounter for general adult medical examination with abnormal findings   [place] [facility] [place], Rankin Buzzard, NP

## 2023-10-14 ENCOUNTER — Encounter: Payer: Self-pay | Admitting: Psychiatry

## 2023-10-14 ENCOUNTER — Ambulatory Visit: Admitting: Psychiatry

## 2023-10-14 VITALS — BP 132/78 | HR 107 | Temp 98.4°F | Ht 62.0 in | Wt 241.6 lb

## 2023-10-14 DIAGNOSIS — F429 Obsessive-compulsive disorder, unspecified: Secondary | ICD-10-CM

## 2023-10-14 DIAGNOSIS — Z79899 Other long term (current) drug therapy: Secondary | ICD-10-CM

## 2023-10-14 DIAGNOSIS — F251 Schizoaffective disorder, depressive type: Secondary | ICD-10-CM

## 2023-10-14 DIAGNOSIS — F411 Generalized anxiety disorder: Secondary | ICD-10-CM

## 2023-10-14 MED ORDER — ZIPRASIDONE HCL 60 MG PO CAPS: 60.0000 mg | ORAL_CAPSULE | Freq: Two times a day (BID) | ORAL | 0 refills | Status: DC

## 2023-10-14 MED ORDER — DIVALPROEX SODIUM ER 500 MG PO TB24: 1000.0000 mg | ORAL_TABLET | Freq: Every day | ORAL | 0 refills | Status: DC

## 2023-10-14 MED ORDER — GABAPENTIN 600 MG PO TABS: 600.0000 mg | ORAL_TABLET | Freq: Three times a day (TID) | ORAL | 0 refills | Status: DC

## 2023-10-14 NOTE — Patient Instructions (Signed)
 Increase Geodon  60 mg twice a day  Continue Depakote  1000 mg daily  Continue Viibryd  40 mg daily Continue gabapentin  600 mg 3 times a day Next appointment- 7/30 at 9:30

## 2023-10-19 ENCOUNTER — Other Ambulatory Visit: Payer: Self-pay | Admitting: Internal Medicine

## 2023-10-21 NOTE — Telephone Encounter (Signed)
 Requested Prescriptions  Pending Prescriptions Disp Refills   insulin  degludec (TRESIBA  FLEXTOUCH) 100 UNIT/ML FlexTouch Pen [Pharmacy Med Name: TRESIBA  FLEXTOUCH PEN ([id number]] 15 mL 1    Sig: ADMINISTER 40 UNITS UNDER THE SKIN EVERY DAY. NEED OFFICE VISIT FOR ADDITIONAL REFILLS     Endocrinology:  Diabetes - Insulins Failed - 10/21/2023  9:08 AM      Failed - HBA1C is between 0 and 7.9 and within 180 days    HB A1C (BAYER DCA - WAIVED)  Date Value Ref Range Status  10/22/2017 6.9 <7.0 % Final    Comment:                                          Diabetic Adult            <7.0                                       Healthy Adult        4.3 - 5.7                                                           (DCCT/NGSP) American Diabetes Association's Summary of Glycemic Recommendations for Adults with Diabetes: Hemoglobin A1c <7.0%. More stringent glycemic goals (A1c <6.0%) may further reduce complications at the cost of increased risk of hypoglycemia.    Hgb A1c MFr Bld  Date Value Ref Range Status  09/24/2023 9.3 (H) <5.7 % Final    Comment:    For someone without known diabetes, a hemoglobin A1c value of 6.5% or greater indicates that they may have  diabetes and this should be confirmed with a follow-up  test. . For someone with known diabetes, a value <7% indicates  that their diabetes is well controlled and a value  greater than or equal to 7% indicates suboptimal  control. A1c targets should be individualized based on  duration of diabetes, age, comorbid conditions, and  other considerations. . Currently, no consensus exists regarding use of hemoglobin A1c for diagnosis of diabetes for children. [name] - Valid encounter within last 6 months    Recent Outpatient Visits           1 week ago Hypertension associated with diabetes [facility])   [place] [facility] [place], Angeline ORN, NP   3 weeks ago Encounter for general adult medical examination  with abnormal findings    [facility] [place], Angeline ORN, [place]

## 2023-10-27 ENCOUNTER — Other Ambulatory Visit: Payer: Self-pay | Admitting: Internal Medicine

## 2023-10-29 NOTE — Telephone Encounter (Signed)
 Requested medications are due for refill today.  no  Requested medications are on the active medications list.  yes  Last refill. 09/27/2023 #200 0 rf  Future visit scheduled.   yes  Notes to clinic.  Protocol will not attach    Requested Prescriptions  Pending Prescriptions Disp Refills   ACCU-CHEK GUIDE TEST test strip [Pharmacy Med Name: ACCU-CHEK GUIDE TEST STRIPS 50] 200 strip 0    Sig: USE AS INSTRUCTED THREE TIMES DAILY AS NEEDED     There is no refill protocol information for this order

## 2023-11-08 ENCOUNTER — Other Ambulatory Visit: Payer: Self-pay | Admitting: Internal Medicine

## 2023-11-10 ENCOUNTER — Other Ambulatory Visit: Payer: Self-pay | Admitting: Psychiatry

## 2023-11-10 DIAGNOSIS — F251 Schizoaffective disorder, depressive type: Secondary | ICD-10-CM

## 2023-11-17 NOTE — Progress Notes (Unsigned)
 Virtual Visit via Video Note  I connected with Tabitha [name] on 11/18/23 at  8:30 AM EDT by a video enabled telemedicine application and verified that I am speaking with the correct person using two identifiers.  Location: Patient: home Provider: office Persons participated in the visit- patient, provider    I discussed the limitations of evaluation and management by telemedicine and the availability of in person appointments. The patient expressed understanding and agreed to proceed.    I discussed the assessment and treatment plan with the patient. The patient was provided an opportunity to ask questions and all were answered. The patient agreed with the plan and demonstrated an understanding of the instructions.   The patient was advised to call back or seek an in-person evaluation if the symptoms worsen or if the condition fails to improve as anticipated.   Katheren Sleet, MD     [facility] MD/PA/NP OP Progress Note  11/18/2023 9:00 AM Tabitha [name]  MRN:  [id number]  Chief Complaint:  Chief Complaint  Patient presents with   Follow-up   HPI:  This is a follow up appointment for schizoaffective disorder, OCD and anxiety.  She states that she feels anxious.  She attributes this to her son, who starts working, and another son, who goes to college.  She has been able to go to a new church.  She goes there with her family, and she feels more comfortable.  She decided to change church as people at the previous church knows that her husband is on the list.  She continues to hear voices calling her.  She actually went to the other room to check it.  She also sees some shadow at times.  However, she thinks these are better.  She denies CAH.  She continues to feel something is getting out of skin.  It is creepy crawling, although she has some days she does not have these.  She tends to think if she is saved, which she did not used to think.  She feels depressed at times.  She has anhedonia,  and stays in the bed.  She has some good days, and does household chores.  She denies SI, HI.  She denies alcohol use or substance use.  She feels comfortable to stay on the current medication regimen at this time., and have close follow-up.   Daily routine: house chores, goes to church weekly.  Exercise: Employment: unemployed, on disability due to paranoia (could not keep her job due to paranoia that people coming to her workplace). used to work at [employer] until 2007 (quit due to pregnancy) Support: husband, 47, son, parents Household: husband (unemployed, had gas gangrene in his right side extremities s/p bilateral amputation, adl independent), 2 sons Marital status: married Number of children: 2 sons Education: college, majored in Psychologist, educational, music  Visit Diagnosis:    ICD-10-CM   1. Schizoaffective disorder, depressive type (HCC)  F25.1     2. Obsessive-compulsive disorder, unspecified type  F42.9     3. Anxiety state  F41.1     4. High risk medication use  Z79.899       Past Psychiatric History: Please see initial evaluation for full details. I have reviewed the history. No updates at this time.     Past Medical History:  Past Medical History:  Diagnosis Date   Allergic rhinitis    Anxiety    Arthritis 2019   Asthma    Bipolar disorder (HCC)    Depression  Diabetes mellitus without complication (HCC)    GERD (gastroesophageal reflux disease)    Hyperlipidemia    Hypertension    Mild sleep apnea    OCD (obsessive compulsive disorder)    Palpitations    Paranoia (HCC)    Schizoaffective disorder (HCC)    Sleep apnea     Past Surgical History:  Procedure Laterality Date   CESAREAN SECTION  2006/2008   CHOLECYSTECTOMY  04/27/2004   TUBAL LIGATION  2008    Family Psychiatric History: Please see initial evaluation for full details. I have reviewed the history. No updates at this time.     Family History:  Family History  Problem Relation Age of Onset   Lupus  Mother    Diabetes Mother        pre diabetes   Diabetes Father    Hyperlipidemia Father    Diabetes Brother    Breast cancer Maternal Grandmother    Depression Paternal Grandmother    Bipolar disorder Paternal Grandmother    Cancer - Colon Neg Hx     Social History:  Social History   Socioeconomic History   Marital status: Married    Spouse name: Elliette Seabolt   Number of children: 2   Years of education: 16   Highest education level: Bachelor's degree (e.g., BA, AB, BS)  Occupational History   Occupation: disability  Tobacco Use   Smoking status: Never   Smokeless tobacco: Never  Vaping Use   Vaping status: Never Used  Substance and Sexual Activity   Alcohol use: Not Currently   Drug use: Never   Sexual activity: Not Currently    Partners: Male    Birth control/protection: Surgical  Other Topics Concern   Not on file  Social History Narrative   Not on file   Social Drivers of Health   Financial Resource Strain: Medium Risk (10/07/2023)   Overall Financial Resource Strain (CARDIA)    Difficulty of Paying Living Expenses: Somewhat hard  Food Insecurity: Food Insecurity Present (10/07/2023)   Hunger Vital Sign    Worried About Running Out of Food in the Last Year: Sometimes true    Ran Out of Food in the Last Year: Sometimes true  Transportation Needs: No Transportation Needs (10/07/2023)   PRAPARE - Administrator, Civil Service (Medical): No    Lack of Transportation (Non-Medical): No  Physical Activity: Inactive (10/07/2023)   Exercise Vital Sign    Days of Exercise per Week: 0 days    Minutes of Exercise per Session: Not on file  Stress: Stress Concern Present (10/07/2023)   [employer] of Occupational Health - Occupational Stress Questionnaire    Feeling of Stress: Rather much  Social Connections: Socially Integrated (10/07/2023)   Social Connection and Isolation Panel    Frequency of Communication with Friends and Family: Twice a week     Frequency of Social Gatherings with Friends and Family: Once a week    Attends Religious Services: More than 4 times per year    Active Member of [employer] or Organizations: Yes    Attends Engineer, structural: More than 4 times per year    Marital Status: Married    Allergies:  Allergies  Allergen Reactions   Abilify [Aripiprazole] Hives    Metabolic Disorder Labs: Lab Results  Component Value Date   HGBA1C 9.3 (H) 09/24/2023   MPG 220 09/24/2023   MPG 206 06/23/2022   Lab Results  Component Value Date   PROLACTIN 16.3  08/30/2023   PROLACTIN 37.8 (H) 01/22/2022   Lab Results  Component Value Date   CHOL 290 (H) 09/24/2023   TRIG 817 (H) 09/24/2023   HDL 53 09/24/2023   CHOLHDL 5.5 (H) 09/24/2023   VLDL 51 (H) 04/23/2017   LDLCALC  09/24/2023     Comment:     . LDL cholesterol not calculated. Triglyceride levels greater than 400 mg/dL invalidate calculated LDL results. . Reference range: <100 . Desirable range <100 mg/dL for primary prevention;   <70 mg/dL for patients with CHD or diabetic patients  with > or = 2 CHD risk factors. SABRA LDL-C is now calculated using the Martin-Hopkins  calculation, which is a validated novel method providing  better accuracy than the Friedewald equation in the  estimation of LDL-C.  Gladis APPLETHWAITE et al. SANDREA. 7986;689(80): 2061-2068  (http://education.[employer].com/faq/FAQ164)    LDLCALC 52 01/07/2023   Lab Results  Component Value Date   TSH 1.026 01/22/2022   TSH 1.56 12/17/2021    Therapeutic Level Labs: No results found for: LITHIUM Lab Results  Component Value Date   VALPROATE 81 08/30/2023   VALPROATE 34 (L) 07/21/2023   No results found for: CBMZ  Current Medications: Current Outpatient Medications  Medication Sig Dispense Refill   ACCU-CHEK GUIDE TEST test strip USE AS INSTRUCTED THREE TIMES DAILY AS NEEDED 200 strip 0   albuterol  (VENTOLIN  HFA) 108 (90 Base) MCG/ACT inhaler Inhale 2 puffs into  the lungs every 6 (six) hours as needed for wheezing or shortness of breath. 25.5 g 3   atorvastatin  (LIPITOR) 40 MG tablet TAKE 1 TABLET(40 MG) BY MOUTH DAILY 90 tablet 3   divalproex  (DEPAKOTE  ER) 500 MG 24 hr tablet Take 2 tablets (1,000 mg total) by mouth daily. 180 tablet 0   EPINEPHrine  0.3 mg/0.3 mL IJ SOAJ injection Inject 0.3 mg into the muscle as needed for anaphylaxis. 1 each 0   ezetimibe  (ZETIA ) 10 MG tablet Take 1 tablet (10 mg total) by mouth daily. 90 tablet 0   fluticasone  (FLONASE ) 50 MCG/ACT nasal spray Place 2 sprays into both nostrils daily.     fluticasone  (FLOVENT  HFA) 110 MCG/ACT inhaler Inhale 1 puff into the lungs 2 (two) times daily. 1 Inhaler 11   [START ON 11/19/2023] gabapentin  (NEURONTIN ) 600 MG tablet Take 1 tablet (600 mg total) by mouth 3 (three) times daily. 270 tablet 0   glipiZIDE  (GLUCOTROL ) 10 MG tablet TAKE 1 TABLET BY MOUTH TWICE DAILY 180 tablet 1   ibuprofen (ADVIL) 200 MG tablet Take 200 mg by mouth every 6 (six) hours as needed.     insulin  degludec (TRESIBA  FLEXTOUCH) 100 UNIT/ML FlexTouch Pen ADMINISTER 40 UNITS UNDER THE SKIN EVERY DAY. NEED OFFICE VISIT FOR ADDITIONAL REFILLS 15 mL 1   Insulin  Pen Needle (BD PEN NEEDLE NANO U/F) 32G X 4 MM MISC 1 each by Does not apply route daily. 100 each 2   MELATONIN PO Take 5 mg by mouth at bedtime as needed.     metFORMIN  (GLUCOPHAGE ) 500 MG tablet TAKE 2 TABLETS(1000 MG) BY MOUTH TWICE DAILY WITH A MEAL 120 tablet 0   olmesartan  (BENICAR ) 20 MG tablet Take 1 tablet (20 mg total) by mouth daily. 90 tablet 1   omeprazole  (PRILOSEC) 10 MG capsule TAKE 1 CAPSULE(10 MG) BY MOUTH DAILY 90 capsule 1   Vilazodone  HCl (VIIBRYD ) 40 MG TABS Take 1 tablet (40 mg total) by mouth daily. 90 tablet 1   ziprasidone  (GEODON ) 40 MG capsule Take 1 capsule (  40 mg total) by mouth every evening. (Patient not taking: Reported on 10/14/2023) 90 capsule 0   ziprasidone  (GEODON ) 60 MG capsule Take 1 capsule (60 mg total) by mouth daily.  90 capsule 0   ziprasidone  (GEODON ) 60 MG capsule Take 1 capsule (60 mg total) by mouth 2 (two) times daily with a meal. 180 capsule 0   No current facility-administered medications for this visit.     Musculoskeletal: Strength & Muscle Tone: N/A Gait & Station: N/A Patient leans: N/A  Psychiatric Specialty Exam: Review of Systems  Psychiatric/Behavioral:  Positive for dysphoric mood and sleep disturbance. Negative for agitation, behavioral problems, confusion, decreased concentration, hallucinations, self-injury and suicidal ideas. The patient is nervous/anxious. The patient is not hyperactive.   All other systems reviewed and are negative.   Last menstrual period 08/03/2015.There is no height or weight on file to calculate BMI.  General Appearance: Well Groomed  Eye Contact:  Good  Speech:  Clear and Coherent  Volume:  Normal  Mood:  Anxious  Affect:  Appropriate, Congruent, and Restricted  Thought Process:  Coherent  Orientation:  Full (Time, Place, and Person)  Thought Content: Logical   Suicidal Thoughts:  No  Homicidal Thoughts:  No  Memory:  Immediate;   Good  Judgement:  Good  Insight:  Good  Psychomotor Activity:  Normal  Concentration:  Concentration: Good and Attention Span: Good  Recall:  Good  Fund of Knowledge: Good  Language: Good  Akathisia:  No  Handed:  Right  AIMS (if indicated): not done  Assets:  Communication Skills Desire for Improvement  ADL's:  Intact  Cognition: WNL  Sleep:  Fair   Screenings: Geneticist, molecular Office Visit from 05/12/2017 in [facility] Office Visit from 07/29/2016 in [facility] Office Visit from 05/06/2016 in [facility] Office Visit from 04/01/2016 in [facility] Office Visit from 03/28/2015 in [facility]  AIMS Total  Score 0 0 0 0 0   GAD-7    Flowsheet Row Office Visit from 10/08/2023 in [facility] Office Visit from 09[facility]24/2023 in [facility] Office Visit from 12/30/2022 in [facility] Office Visit from 06/23/2022 in [facility] Office Visit from 06/16/2022 in [facility]  Total GAD-7 Score 16 19 14 5 6    Mini-Mental    Flowsheet Row Office Visit from 09/07/2019 in [facility]  Total Score (max 30 points ) 16   PHQ2-9    Flowsheet Row Office Visit from 10/08/2023 in [facility] Most recent reading at 10/08/2023 11:17 AM Office Visit from 09/24/2023 in [facility] Most recent reading at 09/24/2023  1:27 PM Clinical Support from 09/24/2023 in [facility] Most recent reading at 09/24/2023 11:00 AM Office Visit from 12/30/2022 in [facility] Most recent reading at 12/30/2022 10:33 AM Clinical Support from 09/18/2022 in [facility] Most recent reading at 09/18/2022 11:07 AM  PHQ-2 Total Score 4 6 3 2 6   PHQ-9 Total Score 16 23 6 13 9    Flowsheet Row Office Visit from 06/16/2022 in [facility] Office Visit from 05/07/2022 in [facility] Office  Visit from 01/22/2022 in [facility]  C-SSRS RISK CATEGORY No Risk No Risk No Risk     Assessment and Plan:  Tabitha [name] is a 53 y.o. year old female with a history of schizoaffective disorder,  sleep apnea (using CPAP machine), diabetes, hypertension, hyperlipidemia, GERD, who presents for follow up appointment for below.   1. Schizoaffective disorder, depressive type (HCC) 2. Obsessive-compulsive disorder, unspecified type 3. Anxiety  state Other stressors include: her husband being registered as a sex offender    History: Tx from Dr. Calhoun. no psych admission. Originally on Invega  234 mg IM. Perphenazine  6 mg daily, viibryd  40 mg daily, amantadine  100 mg BID, gabapentin  600 mg TID. Unable to continue injection since May due to financial strain   Although she continues to report anxiety, paranoia, and hallucinations, and these appear to be overall improving since uptitration of ziprasidone .  Will maintain on the current dose of ziprasidone  given she reports some symptoms, which could be attributable to akathisia.  There is also a concern of metabolic side effect, although this medication tends to cause minimal adverse reaction.  Will continue Depakote  to target schizoaffective disorder.  Will continue Viibryd  for depression, and gabapentin  for anxiety.   4. High risk medication use      Last checked  EKG HR 74 , QTc (corrected QtcH 445 msec 08/2023  Lipid panels Chol 290 H, TG 817 H 08/2023  HbA1c 9.3 On metformin  08/2023    VPA 81, LFT wnl, Plt wnl 08/2023, Prolactin 16.3 on 08/2023 - improved Na 08/2023 - improved   Plan Continue Geodon  60 mg twice a day (uptitrated from 60 mg in am, 40 mg in pm, 09/2023) - monitor akathisia Continue Depakote  1000 mg daily  Continue Vilazodone  40 mg daily Continue gabapentin  600 mg 3 times a day Next appointment- 8/29 at 11 am, video - She sees [facility] PCP at [facility] - on metformin    Past trials of medication: Abilify (hives), olanzapine  (hives from 7.5 mg, quetiapine, risperidone (stopped working),    The patient demonstrates the following risk factors for suicide: Chronic risk factors for suicide include: psychiatric disorder of schizoaffective disorder. Acute risk factors for suicide include: unemployment. Protective factors for this patient include: positive social support, responsibility to others (children, family), hope for the future and religious beliefs  against suicide. Considering these factors, the overall suicide risk at this point appears to be low. Patient is appropriate for outpatient follow up.  Although she has guns at home, she does not have access to them.        Collaboration of Care: Collaboration of Care: Other reviewed notes in Epic  Patient/Guardian was advised Release of Information must be obtained prior to any record release in order to collaborate their care with an outside provider. Patient/Guardian was advised if they have not already done so to contact the registration department to sign all necessary forms in order for us  to release information regarding their care.   Consent: Patient/Guardian gives verbal consent for treatment and assignment of benefits for services provided during this visit. Patient/Guardian expressed understanding and agreed to proceed.    Katheren Sleet, MD 11/18/2023, 9:00 AM

## 2023-11-18 ENCOUNTER — Telehealth: Admitting: Psychiatry

## 2023-11-18 ENCOUNTER — Encounter: Payer: Self-pay | Admitting: Psychiatry

## 2023-11-18 DIAGNOSIS — F411 Generalized anxiety disorder: Secondary | ICD-10-CM | POA: Diagnosis not present

## 2023-11-18 DIAGNOSIS — F251 Schizoaffective disorder, depressive type: Secondary | ICD-10-CM | POA: Diagnosis not present

## 2023-11-18 DIAGNOSIS — F429 Obsessive-compulsive disorder, unspecified: Secondary | ICD-10-CM | POA: Diagnosis not present

## 2023-11-18 DIAGNOSIS — Z79899 Other long term (current) drug therapy: Secondary | ICD-10-CM

## 2023-11-18 MED ORDER — VILAZODONE HCL 40 MG PO TABS: 40.0000 mg | ORAL_TABLET | Freq: Every day | ORAL | 1 refills | Status: DC

## 2023-11-18 NOTE — Patient Instructions (Signed)
 Continue Geodon  60 mg twice a day  Continue Depakote  1000 mg daily  Continue Viibryd  40 mg daily Continue gabapentin  600 mg 3 times a day Next appointment- 8/29 at 11 am

## 2023-11-24 ENCOUNTER — Telehealth: Admitting: Psychiatry

## 2023-12-08 ENCOUNTER — Other Ambulatory Visit: Payer: Self-pay | Admitting: Psychiatry

## 2023-12-18 NOTE — Progress Notes (Unsigned)
 Virtual Visit via Video Note  I connected with Tabitha [name] on 12/24/23 at 11:00 AM EDT by a video enabled telemedicine application and verified that I am speaking with the correct person using two identifiers.  Location: Patient: home Provider: home office Persons participated in the visit- patient, provider    I discussed the limitations of evaluation and management by telemedicine and the availability of in person appointments. The patient expressed understanding and agreed to proceed.     I discussed the assessment and treatment plan with the patient. The patient was provided an opportunity to ask questions and all were answered. The patient agreed with the plan and demonstrated an understanding of the instructions.   The patient was advised to call back or seek an in-person evaluation if the symptoms worsen or if the condition fails to improve as anticipated.  Katheren Sleet, MD    [facility] MD/PA/NP OP Progress Note  12/24/2023 11:34 AM Tabitha [name]  MRN:  [id number]  Chief Complaint:  Chief Complaint  Patient presents with   Follow-up   HPI:  This is a follow-up appointment for schizoaffective disorder, OCD and anxiety.  She states that she is not doing well.  Her son has moved out and is going to be [employer].  Although her younger son remains at the house, he does not communicate so much about his emotion.  She is concerned about her husband, who is sick.  They have been going to 2 different church.  Although they like the 1 in friendship, that her pastor hardly talks with them since he knows that her husband is on the list.  Other people are nice to them.  She reports concern about the other church as they did not know much people.  She continues to hear voices of family calling her.  She also has occasional paranoia.  She feels anxious, which has worsened since her son has moved out.  She has obsessive thoughts about whether she is saved or not. She has hypersomnia, and has  anhedonia. She denies SI, HI. After having discussed treatment options, she agrees to remain on the current medication regimen while working on the behavioral activation first.   Daily routine: house chores, goes to church weekly.  Exercise: Employment: unemployed, on disability due to paranoia (could not keep her job due to paranoia that people coming to her workplace). used to work at [employer] until 2007 (quit due to pregnancy) Support: husband, 83, son, parents Household: husband (unemployed, had gas gangrene in his right side extremities s/p bilateral amputation, adl independent),  son Marital status: married Number of children: 2 sons (11, another son going to college) Education: college, majored in Psychologist, educational, music  Visit Diagnosis:    ICD-10-CM   1. Schizoaffective disorder, depressive type (HCC)  F25.1     2. Obsessive-compulsive disorder, unspecified type  F42.9     3. Anxiety state  F41.1     4. High risk medication use  Z79.899       Past Psychiatric History: Please see initial evaluation for full details. I have reviewed the history. No updates at this time.     Past Medical History:  Past Medical History:  Diagnosis Date   Allergic rhinitis    Anxiety    Arthritis 2019   Asthma    Bipolar disorder (HCC)    Depression    Diabetes mellitus without complication (HCC)    GERD (gastroesophageal reflux disease)    Hyperlipidemia    Hypertension  Mild sleep apnea    OCD (obsessive compulsive disorder)    Palpitations    Paranoia (HCC)    Schizoaffective disorder (HCC)    Sleep apnea     Past Surgical History:  Procedure Laterality Date   CESAREAN SECTION  2006/2008   CHOLECYSTECTOMY  04/27/2004   TUBAL LIGATION  2008    Family Psychiatric History: Please see initial evaluation for full details. I have reviewed the history. No updates at this time.    Family History:  Family History  Problem Relation Age of Onset   Lupus Mother    Diabetes Mother        pre  diabetes   Diabetes Father    Hyperlipidemia Father    Diabetes Brother    Breast cancer Maternal Grandmother    Depression Paternal Grandmother    Bipolar disorder Paternal Grandmother    Cancer - Colon Neg Hx     Social History:  Social History   Socioeconomic History   Marital status: Married    Spouse name: Dillyn Joaquin   Number of children: 2   Years of education: 16   Highest education level: Bachelor's degree (e.g., BA, AB, BS)  Occupational History   Occupation: disability  Tobacco Use   Smoking status: Never   Smokeless tobacco: Never  Vaping Use   Vaping status: Never Used  Substance and Sexual Activity   Alcohol use: Not Currently   Drug use: Never   Sexual activity: Not Currently    Partners: Male    Birth control/protection: Surgical  Other Topics Concern   Not on file  Social History Narrative   Not on file   Social Drivers of Health   Financial Resource Strain: Medium Risk (10/07/2023)   Overall Financial Resource Strain (CARDIA)    Difficulty of Paying Living Expenses: Somewhat hard  Food Insecurity: Food Insecurity Present (10/07/2023)   Hunger Vital Sign    Worried About Running Out of Food in the Last Year: Sometimes true    Ran Out of Food in the Last Year: Sometimes true  Transportation Needs: No Transportation Needs (10/07/2023)   PRAPARE - Administrator, Civil Service (Medical): No    Lack of Transportation (Non-Medical): No  Physical Activity: Inactive (10/07/2023)   Exercise Vital Sign    Days of Exercise per Week: 0 days    Minutes of Exercise per Session: Not on file  Stress: Stress Concern Present (10/07/2023)   [employer] of Occupational Health - Occupational Stress Questionnaire    Feeling of Stress: Rather much  Social Connections: Socially Integrated (10/07/2023)   Social Connection and Isolation Panel    Frequency of Communication with Friends and Family: Twice a week    Frequency of Social Gatherings with  Friends and Family: Once a week    Attends Religious Services: More than 4 times per year    Active Member of [employer] or Organizations: Yes    Attends Engineer, structural: More than 4 times per year    Marital Status: Married    Allergies:  Allergies  Allergen Reactions   Abilify [Aripiprazole] Hives    Metabolic Disorder Labs: Lab Results  Component Value Date   HGBA1C 9.3 (H) 09/24/2023   MPG 220 09/24/2023   MPG 206 06/23/2022   Lab Results  Component Value Date   PROLACTIN 16.3 08/30/2023   PROLACTIN 37.8 (H) 01/22/2022   Lab Results  Component Value Date   CHOL 290 (H) 09/24/2023   TRIG  817 (H) 09/24/2023   HDL 53 09/24/2023   CHOLHDL 5.5 (H) 09/24/2023   VLDL 51 (H) 04/23/2017   LDLCALC  09/24/2023     Comment:     . LDL cholesterol not calculated. Triglyceride levels greater than 400 mg/dL invalidate calculated LDL results. . Reference range: <100 . Desirable range <100 mg/dL for primary prevention;   <70 mg/dL for patients with CHD or diabetic patients  with > or = 2 CHD risk factors. SABRA LDL-C is now calculated using the Martin-Hopkins  calculation, which is a validated novel method providing  better accuracy than the Friedewald equation in the  estimation of LDL-C.  Gladis APPLETHWAITE et al. SANDREA. 7986;689(80): 2061-2068  (http://education.Quest[facility].com/faq/FAQ164)    LDLCALC 52 01/07/2023   Lab Results  Component Value Date   TSH 1.026 01/22/2022   TSH 1.56 12/17/2021    Therapeutic Level Labs: No results found for: LITHIUM Lab Results  Component Value Date   VALPROATE 81 08/30/2023   VALPROATE 34 (L) 07/21/2023   No results found for: CBMZ  Current Medications: Current Outpatient Medications  Medication Sig Dispense Refill   ACCU-CHEK GUIDE TEST test strip USE AS INSTRUCTED THREE TIMES DAILY AS NEEDED 200 strip 0   albuterol  (VENTOLIN  HFA) 108 (90 Base) MCG/ACT inhaler Inhale 2 puffs into the lungs every 6 (six) hours as  needed for wheezing or shortness of breath. 25.5 g 3   atorvastatin  (LIPITOR) 40 MG tablet TAKE 1 TABLET(40 MG) BY MOUTH DAILY 90 tablet 3   [START ON 01/18/2024] divalproex  (DEPAKOTE  ER) 500 MG 24 hr tablet Take 2 tablets (1,000 mg total) by mouth daily. 180 tablet 0   EPINEPHrine  0.3 mg/0.3 mL IJ SOAJ injection Inject 0.3 mg into the muscle as needed for anaphylaxis. 1 each 0   ezetimibe  (ZETIA ) 10 MG tablet Take 1 tablet (10 mg total) by mouth daily. 90 tablet 0   fluticasone  (FLONASE ) 50 MCG/ACT nasal spray Place 2 sprays into both nostrils daily.     fluticasone  (FLOVENT  HFA) 110 MCG/ACT inhaler Inhale 1 puff into the lungs 2 (two) times daily. 1 Inhaler 11   gabapentin  (NEURONTIN ) 600 MG tablet Take 1 tablet (600 mg total) by mouth 3 (three) times daily. 270 tablet 0   glipiZIDE  (GLUCOTROL ) 10 MG tablet TAKE 1 TABLET BY MOUTH TWICE DAILY 180 tablet 1   ibuprofen (ADVIL) 200 MG tablet Take 200 mg by mouth every 6 (six) hours as needed.     insulin  degludec (TRESIBA  FLEXTOUCH) 100 UNIT/ML FlexTouch Pen ADMINISTER 40 UNITS UNDER THE SKIN EVERY DAY. NEED OFFICE VISIT FOR ADDITIONAL REFILLS 15 mL 1   Insulin  Pen Needle (BD PEN NEEDLE NANO U/F) 32G X 4 MM MISC 1 each by Does not apply route daily. 100 each 2   MELATONIN PO Take 5 mg by mouth at bedtime as needed.     metFORMIN  (GLUCOPHAGE ) 500 MG tablet TAKE 2 TABLETS(1000 MG) BY MOUTH TWICE DAILY WITH A MEAL 120 tablet 0   olmesartan  (BENICAR ) 20 MG tablet Take 1 tablet (20 mg total) by mouth daily. 90 tablet 1   omeprazole  (PRILOSEC) 10 MG capsule TAKE 1 CAPSULE(10 MG) BY MOUTH DAILY 90 capsule 1   Vilazodone  HCl (VIIBRYD ) 40 MG TABS Take 1 tablet (40 mg total) by mouth daily. 90 tablet 1   ziprasidone  (GEODON ) 40 MG capsule Take 1 capsule (40 mg total) by mouth every evening. (Patient not taking: Reported on 10/14/2023) 90 capsule 0   ziprasidone  (GEODON ) 60 MG capsule Take  1 capsule (60 mg total) by mouth daily. 90 capsule 0   [START ON  01/12/2024] ziprasidone  (GEODON ) 60 MG capsule Take 1 capsule (60 mg total) by mouth 2 (two) times daily with a meal. 180 capsule 0   No current facility-administered medications for this visit.     Musculoskeletal: Strength & Muscle Tone: N/A Gait & Station: N/A Patient leans: N/A  Psychiatric Specialty Exam: Review of Systems  Psychiatric/Behavioral:  Positive for dysphoric mood, hallucinations and sleep disturbance. Negative for agitation, behavioral problems, confusion, decreased concentration, self-injury and suicidal ideas. The patient is nervous/anxious. The patient is not hyperactive.   All other systems reviewed and are negative.   Last menstrual period 08/03/2015.There is no height or weight on file to calculate BMI.  General Appearance: Well Groomed  Eye Contact:  Good  Speech:  Clear and Coherent  Volume:  Normal  Mood:  not good  Affect:  Appropriate, Congruent, and Restricted  Thought Process:  Coherent  Orientation:  Full (Time, Place, and Person)  Thought Content: Logical   Suicidal Thoughts:  No  Homicidal Thoughts:  No  Memory:  Immediate;   Good  Judgement:  Good  Insight:  Good  Psychomotor Activity:  Normal  Concentration:  Concentration: Good and Attention Span: Good  Recall:  Good  Fund of Knowledge: Good  Language: Good  Akathisia:  No  Handed:  Right  AIMS (if indicated): not done  Assets:  Communication Skills  ADL's:  Intact  Cognition: WNL  Sleep:  hypersomnia   Screenings: AIMS    Flowsheet Row Office Visit from 05/12/2017 in [facility] Office Visit from 07/29/2016 in [facility] Office Visit from 05/06/2016 in [facility] Office Visit from 04/01/2016 in [facility] Office Visit from 03/28/2015 in [facility]  AIMS Total Score 0 0 0 0 0    GAD-7    Flowsheet Row Office Visit from 10/08/2023 in [facility] Office Visit from 09/24/2023 in [facility] Office Visit from 12[facility]30/2022 in [facility] Office Visit from 06/23/2022 in [facility] Office Visit from 06/16/2022 in [facility]  Total GAD-7 Score 16 19 14 5 6    Mini-Mental    Flowsheet Row Office Visit from 09/07/2019 in [facility]  Total Score (max 30 points ) 16   PHQ2-9    Flowsheet Row Office Visit from 10/08/2023 in [facility] Most recent reading at 10/08/2023 11:17 AM Office Visit from 09/24/2023 in [facility] Most recent reading at 09/24/2023  1:27 PM Clinical Support from 09/24/2023 in [facility] Most recent reading at 09/24/2023 11:00 AM Office Visit from 12/30/2022 in [facility] Most recent reading at 12/30/2022 10:33 AM Clinical Support from 09/18/2022 in [facility] Most recent reading at 09/18/2022 11:07 AM  PHQ-2 Total Score 4 6 3 2 6   PHQ-9 Total Score 16 23 6 13 9    Flowsheet Row Office Visit from 06/16/2022 in [facility] Office Visit from 05/07/2022 in [facility] Office Visit from 01/22/2022 in [facility]  C-SSRS RISK CATEGORY No Risk No Risk No Risk  Assessment and Plan:  ZELL DOUCETTE is a 53 y.o. year old female with a history of schizoaffective disorder,  sleep apnea (using CPAP machine), diabetes, hypertension, hyperlipidemia, GERD, who presents for follow up appointment for below.   1. Schizoaffective disorder, depressive type (HCC) 2. Obsessive-compulsive disorder, unspecified type 3. Anxiety state Other stressors  include: her husband being registered as a sex offender    History: Tx from Dr. Calhoun. no psych admission. Originally on Invega  234 mg IM. Perphenazine  6 mg daily, viibryd  40 mg daily, amantadine  100 mg BID, gabapentin  600 mg TID. Unable to continue injection since May due to financial strain   As she reports slight worsening in depressive symptoms and anxiety along with hallucinations and paranoia since her son moving out for college.  Although further uptitration of olanzapine  could be considered, she agrees to work on behavioral activation first especially given the concern of its metabolic side effect.  Will continue current dose of ziprasidone  to target schizoaffective disorder.  Will continue Depakote  to target mood dysregulation.  Will continue Viibryd  for depression and gabapentin  for anxiety.   4. High risk medication use       Last checked  EKG HR 74 , QTc (corrected QtcH 445 msec 08/2023  Lipid panels Chol 290 H, TG 817 H 08/2023  HbA1c 9.3 On metformin  08/2023    VPA 81, LFT wnl, Plt wnl 08/2023, Prolactin 16.3 on 08/2023 - improved Na 08/2023 - improved   Plan Continue Geodon  60 mg twice a day (uptitrated from 60 mg in am, 40 mg in pm, 09/2023) - monitor akathisia Continue Depakote  1000 mg daily  Continue Vilazodone  40 mg daily Continue gabapentin  600 mg 3 times a day Next appointment- 10/24 at 10 am, video - waitlist for sooner visit - She [facility] - on metformin    Past trials of medication: Abilify (hives), olanzapine  (hives from 7.5 mg), quetiapine, risperidone (stopped working),    The patient demonstrates the following risk factors for suicide: Chronic risk factors for suicide include: psychiatric disorder of schizoaffective disorder. Acute risk factors for suicide include: unemployment. Protective factors for this patient include: positive social support, responsibility to others (children, family), hope for the future and religious  beliefs against suicide. Considering these factors, the overall suicide risk at this point appears to be low. Patient is appropriate for outpatient follow up.  Although she has guns at home, she does not have access to them.     Collaboration of Care: Collaboration of Care: Other reviewed notes in Epic  Patient/Guardian was advised Release of Information must be obtained prior to any record release in order to collaborate their care with an outside provider. Patient/Guardian was advised if they have not already done so to contact the registration department to sign all necessary forms in order for us  to release information regarding their care.   Consent: Patient/Guardian gives verbal consent for treatment and assignment of benefits for services provided during this visit. Patient/Guardian expressed understanding and agreed to proceed.    Katheren Sleet, MD 12/24/2023, 11:34 AM

## 2023-12-21 ENCOUNTER — Telehealth: Payer: Self-pay

## 2023-12-21 NOTE — Telephone Encounter (Signed)
 Copied from CRM #[id number]. Topic: Clinical - Medication Question >> Dec 21, 2023 10:58 AM Debby BROCKS wrote: Reason for CRM: Patient wants to know if she has any refills left for: glipiZIDE  (GLUCOTROL ) 10 MG tablet

## 2023-12-24 ENCOUNTER — Telehealth: Admitting: Psychiatry

## 2023-12-24 ENCOUNTER — Encounter: Payer: Self-pay | Admitting: Psychiatry

## 2023-12-24 DIAGNOSIS — F251 Schizoaffective disorder, depressive type: Secondary | ICD-10-CM

## 2023-12-24 DIAGNOSIS — F429 Obsessive-compulsive disorder, unspecified: Secondary | ICD-10-CM | POA: Diagnosis not present

## 2023-12-24 DIAGNOSIS — Z79899 Other long term (current) drug therapy: Secondary | ICD-10-CM

## 2023-12-24 DIAGNOSIS — F411 Generalized anxiety disorder: Secondary | ICD-10-CM | POA: Diagnosis not present

## 2023-12-24 MED ORDER — DIVALPROEX SODIUM ER 500 MG PO TB24: 1000.0000 mg | ORAL_TABLET | Freq: Every day | ORAL | 0 refills | Status: DC

## 2023-12-24 MED ORDER — ZIPRASIDONE HCL 60 MG PO CAPS: 60.0000 mg | ORAL_CAPSULE | Freq: Two times a day (BID) | ORAL | 0 refills | Status: DC

## 2023-12-24 NOTE — Patient Instructions (Signed)
 Continue Geodon  60 mg twice a day  Continue Depakote  1000 mg daily  Continue Vilazodone  40 mg daily Continue gabapentin  600 mg 3 times a day Next appointment- 10/24 at 10 am

## 2023-12-28 ENCOUNTER — Other Ambulatory Visit: Payer: Self-pay | Admitting: Internal Medicine

## 2023-12-28 NOTE — Telephone Encounter (Signed)
 Requested Prescriptions  Pending Prescriptions Disp Refills   TRESIBA  FLEXTOUCH 100 UNIT/ML FlexTouch Pen [Pharmacy Med Name: TRESIBA  FLEXTOUCH PEN ([id number]] 15 mL 1    Sig: ADMINISTER 40 UNITS UNDER THE SKIN EVERY DAY. NEED OFFICE VISIT FOR ADDITIONAL REFILLS     Endocrinology:  Diabetes - Insulins Failed - 12/28/2023  4:35 PM      Failed - HBA1C is between 0 and 7.9 and within 180 days    HB A1C (BAYER DCA - WAIVED)  Date Value Ref Range Status  10/22/2017 6.9 <7.0 % Final    Comment:                                          Diabetic Adult            <7.0                                       Healthy Adult        4.3 - 5.7                                                           (DCCT/NGSP) American Diabetes Association's Summary of Glycemic Recommendations for Adults with Diabetes: Hemoglobin A1c <7.0%. More stringent glycemic goals (A1c <6.0%) may further reduce complications at the cost of increased risk of hypoglycemia.    Hgb A1c MFr Bld  Date Value Ref Range Status  09/24/2023 9.3 (H) <5.7 % Final    Comment:    For someone without known diabetes, a hemoglobin A1c value of 6.5% or greater indicates that they may have  diabetes and this should be confirmed with a follow-up  test. . For someone with known diabetes, a value <7% indicates  that their diabetes is well controlled and a value  greater than or equal to 7% indicates suboptimal  control. A1c targets should be individualized based on  duration of diabetes, age, comorbid conditions, and  other considerations. . Currently, no consensus exists regarding use of hemoglobin A1c for diagnosis of diabetes for children. [name] - Valid encounter within last 6 months    Recent Outpatient Visits           2 months ago Hypertension associated with diabetes [facility])   [facility] [place], Angeline ORN, NP   3 months ago Encounter for general adult medical examination with abnormal  findings   [place] [place], Angeline ORN, NP

## 2024-01-04 ENCOUNTER — Other Ambulatory Visit: Payer: Self-pay | Admitting: Internal Medicine

## 2024-01-05 NOTE — Telephone Encounter (Signed)
 Lipid panel in date  Requested Prescriptions  Pending Prescriptions Disp Refills   ezetimibe  (ZETIA ) 10 MG tablet [Pharmacy Med Name: EZETIMIBE  10MG  TABLETS] 90 tablet 0    Sig: TAKE 1 TABLET(10 MG) BY MOUTH DAILY     Cardiovascular:  Antilipid - Sterol Transport Inhibitors Failed - 01/05/2024 12:00 PM      Failed - Lipid Panel in normal range within the last 12 months    Cholesterol, Total  Date Value Ref Range Status  09/07/2019 138 100 - 199 mg/dL Final   Cholesterol  Date Value Ref Range Status  09/24/2023 290 (H) <200 mg/dL Final   Cholesterol Piccolo, Waived  Date Value Ref Range Status  04/23/2017 176 <200 mg/dL Final    Comment:                            Desirable                <200                         Borderline High      200- 239                         High                     >239    LDL Cholesterol (Calc)  Date Value Ref Range Status  09/24/2023  mg/dL (calc) Final    Comment:    . LDL cholesterol not calculated. Triglyceride levels greater than 400 mg/dL invalidate calculated LDL results. . Reference range: <100 . Desirable range <100 mg/dL for primary prevention;   <70 mg/dL for patients with CHD or diabetic patients  with > or = 2 CHD risk factors. SABRA LDL-C is now calculated using the Martin-Hopkins  calculation, which is a validated novel method providing  better accuracy than the Friedewald equation in the  estimation of LDL-C.  Gladis APPLETHWAITE et al. SANDREA. 7986;689(80): 2061-2068  (http://education.[facility].com/faq/FAQ164)    HDL  Date Value Ref Range Status  09/24/2023 53 > OR = 50 mg/dL Final  94/86/7978 55 >60 mg/dL Final   Triglycerides  Date Value Ref Range Status  09/24/2023 817 (H) <150 mg/dL Final    Comment:    . If a non-fasting specimen was collected, consider repeat triglyceride testing on a fasting specimen if clinically indicated.  Veatrice et al. J. of Clin. Lipidol. 2015;9:129-169. . . There is increased risk of  pancreatitis when the  triglyceride concentration is very high  (> or = 500 mg/dL, especially if > or = 8999 mg/dL).  Veatrice et al. J. of Clin. Lipidol. 2015;9:129-169. .    Triglycerides Piccolo,Waived  Date Value Ref Range Status  04/23/2017 253 (H) <150 mg/dL Final    Comment:                            Normal                   <150                         Borderline High     150 - 199  High                200 - 499                         Very High                >499          Passed - AST in normal range and within 360 days    AST  Date Value Ref Range Status  09/24/2023 13 10 - 35 U/L Final   AST (SGOT) Piccolo, Waived  Date Value Ref Range Status  04/23/2017 24 11 - 38 U/L Final         Passed - ALT in normal range and within 360 days    ALT  Date Value Ref Range Status  09/24/2023 19 6 - 29 U/L Final   ALT (SGPT) Piccolo, Waived  Date Value Ref Range Status  04/23/2017 35 10 - 47 U/L Final         Passed - Patient is not pregnant      Passed - Valid encounter within last 12 months    Recent Outpatient Visits           2 months ago Hypertension associated with diabetes [facility])   [place] [place], Angeline ORN, NP   3 months ago Encounter for general adult medical examination with abnormal findings   [place] [facility] [place], Angeline ORN, NP

## 2024-01-06 ENCOUNTER — Ambulatory Visit

## 2024-01-06 DIAGNOSIS — Z23 Encounter for immunization: Secondary | ICD-10-CM

## 2024-01-06 NOTE — Progress Notes (Signed)
 Imm140

## 2024-01-11 ENCOUNTER — Ambulatory Visit: Admitting: Internal Medicine

## 2024-01-16 ENCOUNTER — Other Ambulatory Visit: Payer: Self-pay | Admitting: Psychiatry

## 2024-01-26 ENCOUNTER — Encounter: Payer: Self-pay | Admitting: Psychiatry

## 2024-01-26 ENCOUNTER — Telehealth (INDEPENDENT_AMBULATORY_CARE_PROVIDER_SITE_OTHER): Admitting: Psychiatry

## 2024-01-26 DIAGNOSIS — Z79899 Other long term (current) drug therapy: Secondary | ICD-10-CM

## 2024-01-26 DIAGNOSIS — Z5181 Encounter for therapeutic drug level monitoring: Secondary | ICD-10-CM

## 2024-01-26 DIAGNOSIS — F411 Generalized anxiety disorder: Secondary | ICD-10-CM | POA: Diagnosis not present

## 2024-01-26 DIAGNOSIS — F429 Obsessive-compulsive disorder, unspecified: Secondary | ICD-10-CM | POA: Diagnosis not present

## 2024-01-26 DIAGNOSIS — F251 Schizoaffective disorder, depressive type: Secondary | ICD-10-CM

## 2024-01-26 DIAGNOSIS — G4700 Insomnia, unspecified: Secondary | ICD-10-CM

## 2024-01-26 MED ORDER — TRAZODONE HCL 50 MG PO TABS: 25.0000 mg | ORAL_TABLET | Freq: Every day | ORAL | 2 refills | Status: DC

## 2024-01-26 MED ORDER — GABAPENTIN 600 MG PO TABS: 600.0000 mg | ORAL_TABLET | Freq: Three times a day (TID) | ORAL | 0 refills | Status: DC

## 2024-01-26 NOTE — Progress Notes (Signed)
 Virtual Visit via Video Note  I connected with Tabitha [name] on 01/26/24 at  2:00 PM EDT by a video enabled telemedicine application and verified that I am speaking with the correct person using two identifiers.  Location: Patient: home Provider: home office Persons participated in the visit- patient, provider    I discussed the limitations of evaluation and management by telemedicine and the availability of in person appointments. The patient expressed understanding and agreed to proceed.    I discussed the assessment and treatment plan with the patient. The patient was provided an opportunity to ask questions and all were answered. The patient agreed with the plan and demonstrated an understanding of the instructions.   The patient was advised to call back or seek an in-person evaluation if the symptoms worsen or if the condition fails to improve as anticipated.    Katheren Sleet, MD   [facility] MD/PA/NP OP Progress Note  01/26/2024 2:30 PM Tabitha [name]  MRN:  [id number]  Chief Complaint:  Chief Complaint  Patient presents with   Follow-up   HPI:  This is a follow-up appointment for schizoaffective disorder, OCD and anxiety.  She states that she has some up-and-down.  She tends to be worried about rapture she wonders when it is happening.  She has been going through to church.  Although she likes the new church, she does not know anybody.  She agrees to continue going there to get more familiar with other people.  She has kitten who is 1 months old.  Her son brought back home.  She enjoys taking care of this kitten.  The kitten keeps her busy.  Although she has agonizing feeling of anxiety, she has been able to handle it better.  Her husband has a birthday today and that she is planning to celebrate with her sister-in-law and others.  She has initial and middle insomnia.  She sleeps around midnight for a few hours.  She reports increase in appetite.  She was able to see endocrine  endocrinologist, and that it is helpful to have CGM.  She denies SI, HI, hallucinations.  Although she was feeling a little hyper, it lasted only for few moments.  She denies decreased need for sleep or increased goal-directed activities.  She agrees with the plans as outlined below.   Wt Readings from Last 3 Encounters:  10/14/23 241 lb 9.6 oz (109.6 kg)  10/08/23 241 lb (109.3 kg)  09/24/23 240 lb (108.9 kg)     Daily routine: house chores, goes to church weekly.  Exercise: Employment: unemployed, on disability due to paranoia (could not keep her job due to paranoia that people coming to her workplace). used to work at [employer] until 2007 (quit due to pregnancy) Support: husband, 35, son, parents Household: husband (unemployed, had gas gangrene in his right side extremities s/p bilateral amputation, adl independent),  son Marital status: married Number of children: 2 sons (56, another son going to college) Education: college, majored in Psychologist, educational, music   Visit Diagnosis:    ICD-10-CM   1. Schizoaffective disorder, depressive type (HCC)  F25.1     2. Obsessive-compulsive disorder, unspecified type  F42.9     3. Anxiety state  F41.1     4. Insomnia, unspecified type  G47.00     5. High risk medication use  Z79.899 Valproic acid  level    CBC    Hepatic function panel      Past Psychiatric History: Please see initial evaluation for full  details. I have reviewed the history. No updates at this time.     Past Medical History:  Past Medical History:  Diagnosis Date   Allergic rhinitis    Anxiety    Arthritis 2019   Asthma    Bipolar disorder (HCC)    Depression    Diabetes mellitus without complication (HCC)    GERD (gastroesophageal reflux disease)    Hyperlipidemia    Hypertension    Mild sleep apnea    OCD (obsessive compulsive disorder)    Palpitations    Paranoia (HCC)    Schizoaffective disorder (HCC)    Sleep apnea     Past Surgical History:  Procedure Laterality  Date   CESAREAN SECTION  2006/2008   CHOLECYSTECTOMY  04/27/2004   TUBAL LIGATION  2008    Family Psychiatric History: Please see initial evaluation for full details. I have reviewed the history. No updates at this time.     Family History:  Family History  Problem Relation Age of Onset   Lupus Mother    Diabetes Mother        pre diabetes   Diabetes Father    Hyperlipidemia Father    Diabetes Brother    Breast cancer Maternal Grandmother    Depression Paternal Grandmother    Bipolar disorder Paternal Grandmother    Cancer - Colon Neg Hx     Social History:  Social History   Socioeconomic History   Marital status: Married    Spouse name: Eulalah Rupert   Number of children: 2   Years of education: 16   Highest education level: Bachelor's degree (e.g., BA, AB, BS)  Occupational History   Occupation: disability  Tobacco Use   Smoking status: Never   Smokeless tobacco: Never  Vaping Use   Vaping status: Never Used  Substance and Sexual Activity   Alcohol use: Not Currently   Drug use: Never   Sexual activity: Not Currently    Partners: Male    Birth control/protection: Surgical  Other Topics Concern   Not on file  Social History Narrative   Not on file   Social Drivers of Health   Financial Resource Strain: Medium Risk (01/14/2024)   Received from [facility]   Overall Financial Resource Strain (CARDIA)    Difficulty of Paying Living Expenses: Somewhat hard  Food Insecurity: Food Insecurity Present (01/14/2024)   Received from [facility]   Hunger Vital Sign    Within the past 12 months, you worried that your food would run out before you got the money to buy more.: Sometimes true    Within the past 12 months, the food you bought just didn't last and you didn't have money to get more.: Sometimes true  Transportation Needs: No Transportation Needs (01/14/2024)   Received from [facility] -  Transportation    In the past 12 months, has lack of transportation kept you from medical appointments or from getting medications?: No    Lack of Transportation (Non-Medical): No  Physical Activity: Inactive (10/07/2023)   Exercise Vital Sign    Days of Exercise per Week: 0 days    Minutes of Exercise per Session: Not on file  Stress: Stress Concern Present (10/07/2023)   [employer] of Occupational Health - Occupational Stress Questionnaire    Feeling of Stress: Rather much  Social Connections: Socially Integrated (10/07/2023)   Social Connection and Isolation Panel    Frequency of Communication with Friends  and Family: Twice a week    Frequency of Social Gatherings with Friends and Family: Once a week    Attends Religious Services: More than 4 times per year    Active Member of [employer] or Organizations: Yes    Attends Engineer, structural: More than 4 times per year    Marital Status: Married    Allergies:  Allergies  Allergen Reactions   Abilify [Aripiprazole] Hives    Metabolic Disorder Labs: Lab Results  Component Value Date   HGBA1C 9.3 (H) 09/24/2023   MPG 220 09/24/2023   MPG 206 06/23/2022   Lab Results  Component Value Date   PROLACTIN 16.3 08/30/2023   PROLACTIN 37.8 (H) 01/22/2022   Lab Results  Component Value Date   CHOL 290 (H) 09/24/2023   TRIG 817 (H) 09/24/2023   HDL 53 09/24/2023   CHOLHDL 5.5 (H) 09/24/2023   VLDL 51 (H) 04/23/2017   LDLCALC  09/24/2023     Comment:     . LDL cholesterol not calculated. Triglyceride levels greater than 400 mg/dL invalidate calculated LDL results. . Reference range: <100 . Desirable range <100 mg/dL for primary prevention;   <70 mg/dL for patients with CHD or diabetic patients  with > or = 2 CHD risk factors. SABRA LDL-C is now calculated using the Martin-Hopkins  calculation, which is a validated novel method providing  better accuracy than the Friedewald equation in the  estimation of LDL-C.   Gladis APPLETHWAITE et al. SANDREA. 7986;689(80): 2061-2068  (http://education.Quest[employer].com/faq/FAQ164)    LDLCALC 52 01/07/2023   Lab Results  Component Value Date   TSH 1.026 01/22/2022   TSH 1.56 12/17/2021    Therapeutic Level Labs: No results found for: LITHIUM Lab Results  Component Value Date   VALPROATE 81 08/30/2023   VALPROATE 34 (L) 07/21/2023   No results found for: CBMZ  Current Medications: Current Outpatient Medications  Medication Sig Dispense Refill   traZODone  (DESYREL ) 50 MG tablet Take 0.5-1 tablets (25-50 mg total) by mouth at bedtime. 30 tablet 2   ACCU-CHEK GUIDE TEST test strip USE AS INSTRUCTED THREE TIMES DAILY AS NEEDED 200 strip 0   albuterol  (VENTOLIN  HFA) 108 (90 Base) MCG/ACT inhaler Inhale 2 puffs into the lungs every 6 (six) hours as needed for wheezing or shortness of breath. 25.5 g 3   atorvastatin  (LIPITOR) 40 MG tablet TAKE 1 TABLET(40 MG) BY MOUTH DAILY 90 tablet 3   divalproex  (DEPAKOTE  ER) 500 MG 24 hr tablet Take 2 tablets (1,000 mg total) by mouth daily. 180 tablet 0   EPINEPHrine  0.3 mg/0.3 mL IJ SOAJ injection Inject 0.3 mg into the muscle as needed for anaphylaxis. 1 each 0   ezetimibe  (ZETIA ) 10 MG tablet TAKE 1 TABLET(10 MG) BY MOUTH DAILY 90 tablet 0   fluticasone  (FLONASE ) 50 MCG/ACT nasal spray Place 2 sprays into both nostrils daily.     fluticasone  (FLOVENT  HFA) 110 MCG/ACT inhaler Inhale 1 puff into the lungs 2 (two) times daily. 1 Inhaler 11   gabapentin  (NEURONTIN ) 600 MG tablet Take 1 tablet (600 mg total) by mouth 3 (three) times daily. 270 tablet 0   glipiZIDE  (GLUCOTROL ) 10 MG tablet TAKE 1 TABLET BY MOUTH TWICE DAILY 180 tablet 1   ibuprofen (ADVIL) 200 MG tablet Take 200 mg by mouth every 6 (six) hours as needed.     Insulin  Pen Needle (BD PEN NEEDLE NANO U/F) 32G X 4 MM MISC 1 each by Does not apply route daily. 100 each  2   MELATONIN PO Take 5 mg by mouth at bedtime as needed.     metFORMIN  (GLUCOPHAGE ) 500 MG tablet  TAKE 2 TABLETS(1000 MG) BY MOUTH TWICE DAILY WITH A MEAL 120 tablet 0   olmesartan  (BENICAR ) 20 MG tablet Take 1 tablet (20 mg total) by mouth daily. 90 tablet 1   omeprazole  (PRILOSEC) 10 MG capsule TAKE 1 CAPSULE(10 MG) BY MOUTH DAILY 90 capsule 1   TRESIBA  FLEXTOUCH 100 UNIT/ML FlexTouch Pen ADMINISTER 40 UNITS UNDER THE SKIN EVERY DAY. NEED OFFICE VISIT FOR ADDITIONAL REFILLS 15 mL 1   Vilazodone  HCl (VIIBRYD ) 40 MG TABS Take 1 tablet (40 mg total) by mouth daily. 90 tablet 1   ziprasidone  (GEODON ) 40 MG capsule Take 1 capsule (40 mg total) by mouth every evening. (Patient not taking: Reported on 10/14/2023) 90 capsule 0   ziprasidone  (GEODON ) 60 MG capsule Take 1 capsule (60 mg total) by mouth daily. 90 capsule 0   ziprasidone  (GEODON ) 60 MG capsule Take 1 capsule (60 mg total) by mouth 2 (two) times daily with a meal. 180 capsule 0   No current facility-administered medications for this visit.     Musculoskeletal: Strength & Muscle Tone: N/A Gait & Station: N/A Patient leans: N/A  Psychiatric Specialty Exam: Review of Systems  Psychiatric/Behavioral:  Positive for dysphoric mood and sleep disturbance. Negative for agitation, behavioral problems, confusion, decreased concentration, hallucinations, self-injury and suicidal ideas. The patient is nervous/anxious. The patient is not hyperactive.   All other systems reviewed and are negative.   Last menstrual period 08/03/2015.There is no height or weight on file to calculate BMI.  General Appearance: Well Groomed  Eye Contact:  Good  Speech:  Clear and Coherent  Volume:  Normal  Mood:  Anxious  Affect:  Appropriate, Congruent, and calm  Thought Process:  Coherent  Orientation:  Full (Time, Place, and Person)  Thought Content: Logical   Suicidal Thoughts:  No  Homicidal Thoughts:  No  Memory:  Immediate;   Good  Judgement:  Good  Insight:  Good  Psychomotor Activity:  Normal  Concentration:  Concentration: Good and Attention  Span: Good  Recall:  Good  Fund of Knowledge: Good  Language: Good  Akathisia:  No  Handed:  Right  AIMS (if indicated): not done  Assets:  Communication Skills Desire for Improvement  ADL's:  Intact  Cognition: WNL  Sleep:  Poor   Screenings: AIMS    Flowsheet Row Office Visit from 05/12/2017 in [facility] Office Visit from 07/29/2016 in [facility] Office Visit from 05/06/2016 in [facility] Office Visit from 04/01/2016 in [facility] Office Visit from 03/28/2015 in [facility]  AIMS Total Score 0 0 0 0 0   GAD-7    Flowsheet Row Office Visit from 10/08/2023 in [facility] Office Visit from 09/24/2023 in [facility] Office Visit from 12/30/2022 in [id number] Office Visit from 06/23/2022 in [facility] Office Visit from 06/16/2022 in [facility]  Total GAD-7 Score 16 19 14 5 6    Mini-Mental    Flowsheet Row Office Visit from 09/07/2019 in [facility]  Total Score (max 30 points ) 16   PHQ2-9    Flowsheet Row Office Visit from 10/08/2023 in [facility] Most recent reading at 10/08/2023 11:17 AM Office Visit from 09/24/2023 in [facility] Most recent reading at 09/24/2023  1:27 PM Clinical Support from 09/24/2023 in [facility] Most recent reading at 09/24/2023 11:00 AM Office Visit from 12/30/2022 in [facility] Most recent reading at 12/30/2022 10:33 AM Clinical Support from 09/18/2022 in [facility] Most recent reading at 09/18/2022 11:07 AM  PHQ-2 Total Score 4 6 3 2 6   PHQ-9 Total  Score 16 23 6 13 9    Flowsheet Row Office Visit from 06/16/2022 in [facility] Office Visit from 05/07/2022 in [facility] Office Visit from 01/22/2022 in [facility]  C-SSRS RISK CATEGORY No Risk No Risk No Risk     Assessment and Plan:  Tabitha [name] is a 53 y.o. year old female with a history of schizoaffective disorder,  sleep apnea (using CPAP machine), diabetes, hypertension, hyperlipidemia, GERD, who presents for follow up appointment for below.    1. Schizoaffective disorder, depressive type (HCC) 2. Obsessive-compulsive disorder, unspecified type 3. Anxiety state Other stressors include: her husband being registered as a sex offender    History: Tx from Dr. Calhoun. no psych admission. Originally on Invega  234 mg IM. Perphenazine  6 mg daily, viibryd  40 mg daily, amantadine  100 mg BID, gabapentin  600 mg TID. Unable to continue injection since May due to financial strain   The exam is notable for calm affect.  Although she continues to report occasional depressive symptoms and anxiety, paranoia, that has been more manageable since taking care of kitten at home.  Psychoeducation is provided regarding behavioral activation.  She feels comfortable to stay on the current medication regimen.  Will continue with ziprasidone  to target schizoaffective disorder, along with Depakote  for mood dysregulation.  Will continue Viibryd  for depression and gabapentin  for anxiety.   4. Insomnia, unspecified type She reports initial and middle insomnia.  Will try trazodone  as needed for insomnia.  Discussed potential risk of drowsiness.   5. High risk medication use Will obtain labs given she is on Depakote .        Last checked  EKG HR 74 , QTc (corrected QtcH 445 msec 08/2023  Lipid panels Chol 290 H, TG 817 H 08/2023  HbA1c 9.3 On metformin  08/2023    VPA 81, LFT wnl,  Plt wnl 08/2023, Prolactin 16.3 on 08/2023 - improved Na 08/2023 - improved   Plan Continue Geodon  60 mg twice a day (uptitrated from 60 mg in am, 40 mg in pm, 09/2023) - monitor akathisia Continue Depakote  1000 mg daily  Continue Vilazodone  40 mg daily Continue gabapentin  600 mg 3 times a day Start trazodone  25-50 mg at night as needed for insomnia Obtain labs at [facility] (VPA, LFT, CBC) Next appointment- 12/10 at 2 pm, video - She sees her PCP at [facility] - on metformin    Past trials of medication: Abilify (hives), olanzapine  (hives from 7.5 mg), quetiapine, risperidone (stopped working),    The patient demonstrates the following risk factors for suicide: Chronic risk factors for suicide include: psychiatric disorder of schizoaffective disorder. Acute risk factors for suicide include: unemployment. Protective factors for this patient include: positive social support, responsibility to others (children, family), hope for the future and religious beliefs against suicide. Considering these factors, the overall suicide risk at this point appears to be low.  Patient is appropriate for outpatient follow up.  Although she has guns at home, she does not have access to them.    Collaboration of Care: Collaboration of Care: Other reviewed notes in Epic  Patient/Guardian was advised Release of Information must be obtained prior to any record release in order to collaborate their care with an outside provider. Patient/Guardian was advised if they have not already done so to contact the registration department to sign all necessary forms in order for us  to release information regarding their care.   Consent: Patient/Guardian gives verbal consent for treatment and assignment of benefits for services provided during this visit. Patient/Guardian expressed understanding and agreed to proceed.    Katheren Sleet, MD 01/26/2024, 2:30 PM

## 2024-02-09 ENCOUNTER — Ambulatory Visit: Payer: Self-pay

## 2024-02-09 ENCOUNTER — Telehealth: Admitting: Physician Assistant

## 2024-02-09 DIAGNOSIS — J069 Acute upper respiratory infection, unspecified: Secondary | ICD-10-CM | POA: Diagnosis not present

## 2024-02-09 DIAGNOSIS — J4541 Moderate persistent asthma with (acute) exacerbation: Secondary | ICD-10-CM | POA: Diagnosis not present

## 2024-02-09 DIAGNOSIS — B9689 Other specified bacterial agents as the cause of diseases classified elsewhere: Secondary | ICD-10-CM

## 2024-02-09 MED ORDER — IPRATROPIUM-ALBUTEROL 0.5-2.5 (3) MG/3ML IN SOLN
3.0000 mL | RESPIRATORY_TRACT | 0 refills | Status: AC | PRN
Start: 2024-02-09 — End: ?

## 2024-02-09 MED ORDER — PROMETHAZINE-DM 6.25-15 MG/5ML PO SYRP: 5.0000 mL | ORAL_SOLUTION | Freq: Four times a day (QID) | ORAL | 0 refills | Status: AC | PRN

## 2024-02-09 MED ORDER — AMOXICILLIN-POT CLAVULANATE 875-125 MG PO TABS
1.0000 | ORAL_TABLET | Freq: Two times a day (BID) | ORAL | 0 refills | Status: AC
Start: 2024-02-09 — End: ?

## 2024-02-09 MED ORDER — BECLOMETHASONE DIPROP HFA 40 MCG/ACT IN AERB
2.0000 | INHALATION_SPRAY | Freq: Two times a day (BID) | RESPIRATORY_TRACT | 0 refills | Status: AC
Start: 2024-02-09 — End: ?

## 2024-02-09 NOTE — Telephone Encounter (Signed)
Patient is scheduled for video visit today

## 2024-02-09 NOTE — Patient Instructions (Signed)
 Tabitha [name], thank you for joining Tabitha CHRISTELLA Dickinson, Tabitha [name] for today's virtual visit.  While this provider is not your primary care provider (PCP), if your PCP is located in our provider database this encounter information will be shared with them immediately following your visit.   A [facility] MyChart account gives you access to today's visit and all your visits, tests, and labs performed at [facility]  click here if you don't have a [facility] MyChart account or go to [email or web address]  Consent: (Patient) Tabitha [name] provided verbal consent for this virtual visit at the beginning of the encounter.  Current Medications:  Current Outpatient Medications:    amoxicillin -clavulanate (AUGMENTIN ) 875-125 MG tablet, Take 1 tablet by mouth 2 (two) times daily., Disp: 14 tablet, Rfl: 0   beclomethasone (QVAR ) 40 MCG/ACT inhaler, Inhale 2 puffs into the lungs 2 (two) times daily., Disp: 1 each, Rfl: 0   ipratropium-albuterol  (DUONEB) 0.5-2.5 (3) MG/3ML SOLN, Take 3 mLs by nebulization every 4 (four) hours as needed., Disp: 150 mL, Rfl: 0   promethazine-dextromethorphan (PROMETHAZINE-DM) 6.25-15 MG/5ML syrup, Take 5 mLs by mouth 4 (four) times daily as needed., Disp: 118 mL, Rfl: 0   ACCU-CHEK GUIDE TEST test strip, USE AS INSTRUCTED THREE TIMES DAILY AS NEEDED, Disp: 200 strip, Rfl: 0   albuterol  (VENTOLIN  HFA) 108 (90 Base) MCG/ACT inhaler, Inhale 2 puffs into the lungs every 6 (six) hours as needed for wheezing or shortness of breath., Disp: 25.5 g, Rfl: 3   atorvastatin  (LIPITOR) 40 MG tablet, TAKE 1 TABLET(40 MG) BY MOUTH DAILY, Disp: 90 tablet, Rfl: 3   divalproex  (DEPAKOTE  ER) 500 MG 24 hr tablet, Take 2 tablets (1,000 mg total) by mouth daily., Disp: 180 tablet, Rfl: 0   EPINEPHrine  0.3 mg/0.3 mL IJ SOAJ injection, Inject 0.3 mg into the muscle as needed for anaphylaxis., Disp: 1 each, Rfl: 0   ezetimibe  (ZETIA ) 10 MG tablet, TAKE 1 TABLET(10 MG) BY MOUTH  DAILY, Disp: 90 tablet, Rfl: 0   fluticasone  (FLONASE ) 50 MCG/ACT nasal spray, Place 2 sprays into both nostrils daily., Disp: , Rfl:    [START ON 02/24/2024] gabapentin  (NEURONTIN ) 600 MG tablet, Take 1 tablet (600 mg total) by mouth 3 (three) times daily., Disp: 270 tablet, Rfl: 0   glipiZIDE  (GLUCOTROL ) 10 MG tablet, TAKE 1 TABLET BY MOUTH TWICE DAILY, Disp: 180 tablet, Rfl: 1   ibuprofen (ADVIL) 200 MG tablet, Take 200 mg by mouth every 6 (six) hours as needed., Disp: , Rfl:    Insulin  Pen Needle (BD PEN NEEDLE NANO U/F) 32G X 4 MM MISC, 1 each by Does not apply route daily., Disp: 100 each, Rfl: 2   MELATONIN PO, Take 5 mg by mouth at bedtime as needed., Disp: , Rfl:    metFORMIN  (GLUCOPHAGE ) 500 MG tablet, TAKE 2 TABLETS(1000 MG) BY MOUTH TWICE DAILY WITH A MEAL, Disp: 120 tablet, Rfl: 0   olmesartan  (BENICAR ) 20 MG tablet, Take 1 tablet (20 mg total) by mouth daily., Disp: 90 tablet, Rfl: 1   omeprazole  (PRILOSEC) 10 MG capsule, TAKE 1 CAPSULE(10 MG) BY MOUTH DAILY, Disp: 90 capsule, Rfl: 1   traZODone  (DESYREL ) 50 MG tablet, Take 0.5-1 tablets (25-50 mg total) by mouth at bedtime., Disp: 30 tablet, Rfl: 2   TRESIBA  FLEXTOUCH 100 UNIT/ML FlexTouch Pen, ADMINISTER 40 UNITS UNDER THE SKIN EVERY DAY. NEED OFFICE VISIT FOR ADDITIONAL REFILLS, Disp: 15 mL, Rfl: 1   Vilazodone  HCl (VIIBRYD ) 40 MG TABS, Take 1 tablet (40  mg total) by mouth daily., Disp: 90 tablet, Rfl: 1   ziprasidone  (GEODON ) 60 MG capsule, Take 1 capsule (60 mg total) by mouth 2 (two) times daily with a meal., Disp: 180 capsule, Rfl: 0   Medications ordered in this encounter:  Meds ordered this encounter  Medications   amoxicillin -clavulanate (AUGMENTIN ) 875-125 MG tablet    Sig: Take 1 tablet by mouth 2 (two) times daily.    Dispense:  14 tablet    Refill:  0    Supervising Provider:   LAMPTEY, PHILIP O [[id number]]   ipratropium-albuterol  (DUONEB) 0.5-2.5 (3) MG/3ML SOLN    Sig: Take 3 mLs by nebulization every 4 (four)  hours as needed.    Dispense:  150 mL    Refill:  0    Supervising Provider:   BLAISE ALEENE KIDD [[id number]]   beclomethasone (QVAR ) 40 MCG/ACT inhaler    Sig: Inhale 2 puffs into the lungs 2 (two) times daily.    Dispense:  1 each    Refill:  0    Supervising Provider:   BLAISE ALEENE KIDD [[id number]]   promethazine-dextromethorphan (PROMETHAZINE-DM) 6.25-15 MG/5ML syrup    Sig: Take 5 mLs by mouth 4 (four) times daily as needed.    Dispense:  118 mL    Refill:  0    Supervising Provider:   BLAISE ALEENE KIDD [[id number]]     *If you need refills on other medications prior to your next appointment, please contact your pharmacy*  Follow-Up: Call back or seek an in-person evaluation if the symptoms worsen or if the condition fails to improve as anticipated.  [facility] [phone]  Other Instructions Upper Respiratory Infection, Adult An upper respiratory infection (URI) is a common viral infection of the nose, throat, and upper air passages that lead to the lungs. The most common type of URI is the common cold. URIs usually get better on their own, without medical treatment. What are the causes? A URI is caused by a virus. You may catch a virus by: Breathing in droplets from an infected person's cough or sneeze. Touching something that has been exposed to the virus (is contaminated) and then touching your mouth, nose, or eyes. What increases the risk? You are more likely to get a URI if: You are very young or very old. You have close contact with others, such as at work, school, or a health care facility. You smoke. You have long-term (chronic) heart or lung disease. You have a weakened disease-fighting system (immune system). You have nasal allergies or asthma. You are experiencing a lot of stress. You have poor nutrition. What are the signs or symptoms? A URI usually involves some of the following symptoms: Runny or stuffy (congested) nose. Cough. Sneezing. Sore  throat. Headache. Fatigue. Fever. Loss of appetite. Pain in your forehead, behind your eyes, and over your cheekbones (sinus pain). Muscle aches. Redness or irritation of the eyes. Pressure in the ears or face. How is this diagnosed? This condition may be diagnosed based on your medical history and symptoms, and a physical exam. Your health care provider may use a swab to take a mucus sample from your nose (nasal swab). This sample can be tested to determine what virus is causing the illness. How is this treated? URIs usually get better on their own within 7-10 days. Medicines cannot cure URIs, but your health care provider may recommend certain medicines to help relieve symptoms, such as: Over-the-counter cold medicines. Cough suppressants. Coughing is a  type of defense against infection that helps to clear the respiratory system, so take these medicines only as recommended by your health care provider. Fever-reducing medicines. Follow these instructions at home: Activity Rest as needed. If you have a fever, stay home from work or school until your fever is gone or until your health care provider says your URI cannot spread to other people (is no longer contagious). Your health care provider may have you wear a face mask to prevent your infection from spreading. Relieving symptoms Gargle with a mixture of salt and water 3-4 times a day or as needed. To make salt water, completely dissolve -1 tsp (3-6 g) of salt in 1 cup (237 mL) of warm water. Use a cool-mist humidifier to add moisture to the air. This can help you breathe more easily. Eating and drinking  Drink enough fluid to keep your urine pale yellow. Eat soups and other clear broths. General instructions  Take over-the-counter and prescription medicines only as told by your health care provider. These include cold medicines, fever reducers, and cough suppressants. Do not use any products that contain nicotine or tobacco. These  products include cigarettes, chewing tobacco, and vaping devices, such as e-cigarettes. If you need help quitting, ask your health care provider. Stay away from secondhand smoke. Stay up to date on all immunizations, including the yearly (annual) flu vaccine. Keep all follow-up visits. This is important. How to prevent the spread of infection to others URIs can be contagious. To prevent the infection from spreading: Wash your hands with soap and water for at least 20 seconds. If soap and water are not available, use hand sanitizer. Avoid touching your mouth, face, eyes, or nose. Cough or sneeze into a tissue or your sleeve or elbow instead of into your hand or into the air.  Contact a health care provider if: You are getting worse instead of better. You have a fever or chills. Your mucus is brown or red. You have yellow or brown discharge coming from your nose. You have pain in your face, especially when you bend forward. You have swollen neck glands. You have pain while swallowing. You have white areas in the back of your throat. Get help right away if: You have shortness of breath that gets worse. You have severe or persistent: Headache. Ear pain. Sinus pain. Chest pain. You have chronic lung disease along with any of the following: Making high-pitched whistling sounds when you breathe, most often when you breathe out (wheezing). Prolonged cough (more than 14 days). Coughing up blood. A change in your usual mucus. You have a stiff neck. You have changes in your: Vision. Hearing. Thinking. Mood. These symptoms may be an emergency. Get help right away. Call 911. Do not wait to see if the symptoms will go away. Do not drive yourself to the hospital. Summary An upper respiratory infection (URI) is a common infection of the nose, throat, and upper air passages that lead to the lungs. A URI is caused by a virus. URIs usually get better on their own within 7-10 days. Medicines  cannot cure URIs, but your health care provider may recommend certain medicines to help relieve symptoms. This information is not intended to replace advice given to you by your health care provider. Make sure you discuss any questions you have with your health care provider. Document Revised: 11/13/2020 Document Reviewed: 11/13/2020 Elsevier Patient Education  2024 [employer].   If you have been instructed to have an in-person evaluation today  at a local Urgent Care facility, please use the link below. It will take you to a list of all of our available [facility], including address, phone number and hours of operation. Please do not delay care.  [facility]  If you or a family member do not have a primary care provider, use the link below to schedule a visit and establish care. When you choose a [facility] primary care physician or advanced practice provider, you gain a long-term partner in health. Find a Primary Care Provider  Learn more about [facility] in-office and virtual care options: [facility] Get Care Now

## 2024-02-09 NOTE — Telephone Encounter (Signed)
 FYI Only or Action Required?: FYI only for provider.  Patient was last seen in primary care on 10/08/2023 by Antonette Angeline ORN, NP.  Called Nurse Triage reporting Cough.  Symptoms began a week ago.  Interventions attempted: OTC medications: Sudafed, DayQuil.  Symptoms are: unchanged.  Triage Disposition: No disposition on file.  Patient/caregiver understands and will follow disposition?:  Reason for Disposition  Cough has been present for > 3 weeks  Answer Assessment - Initial Assessment Questions Sudafed and Dayquil,  no relief.   1. ONSET: When did the cough begin?      A week ago  2. SEVERITY: How bad is the cough today?      Moderate  3. SPUTUM: Describe the color of your sputum (e.g., none, dry cough; clear, white, yellow, green)     Very little  4. DIFFICULTY BREATHING: Are you having difficulty breathing? If Yes, ask: How bad is it? (e.g., mild, moderate, severe)      Mild due to coughing and sinus congestion  5. FEVER: Do you have a fever? If Yes, ask: What is your temperature, how was it measured, and when did it start?     Unsure  6. OTHER SYMPTOMS: Do you have any other symptoms? (e.g., runny nose, wheezing, chest pain)       Headaches, sinus congestion, sore throat, tightness in chest  Protocols used: Cough - Acute Productive-A-AH  Copied from CRM #[id number]. Topic: Clinical - Red Word Triage >> Feb 09, 2024 12:33 PM Donee H wrote: Red Word that prompted transfer to Nurse Triage: Patient states been feeling ill for a week now. She is experiencing severe coughing, headaches, stuffiness, sore throat and shortness of breath. She would like to be seen today if possible.

## 2024-02-09 NOTE — Progress Notes (Signed)
 Virtual Visit Consent   Tabitha [name], you are scheduled for a virtual visit with a [facility] provider today. Just as with appointments in the office, your consent must be obtained to participate. Your consent will be active for this visit and any virtual visit you may have with one of our providers in the next 365 days. If you have a MyChart account, a copy of this consent can be sent to you electronically.  As this is a virtual visit, video technology does not allow for your provider to perform a traditional examination. This may limit your provider's ability to fully assess your condition. If your provider identifies any concerns that need to be evaluated in person or the need to arrange testing (such as labs, EKG, etc.), we will make arrangements to do so. Although advances in technology are sophisticated, we cannot ensure that it will always work on either your end or our end. If the connection with a video visit is poor, the visit may have to be switched to a telephone visit. With either a video or telephone visit, we are not always able to ensure that we have a secure connection.  By engaging in this virtual visit, you consent to the provision of healthcare and authorize for your insurance to be billed (if applicable) for the services provided during this visit. Depending on your insurance coverage, you may receive a charge related to this service.  I need to obtain your verbal consent now. Are you willing to proceed with your visit today? Tabitha [name] has provided verbal consent on 02/09/2024 for a virtual visit (video or telephone). Delon CHRISTELLA Dickinson, PA-C  Date: 02/09/2024 2:58 PM   Virtual Visit via Video Note   I, Delon CHRISTELLA Dickinson, connected with  Tabitha [name]  ([id number], 1970/05/25) on 02/09/24 at  2:30 PM EDT by a video-enabled telemedicine application and verified that I am speaking with the correct person using two identifiers.  Location: Patient: Virtual Visit  Location Patient: Home Provider: Virtual Visit Location Provider: Home Office   I discussed the limitations of evaluation and management by telemedicine and the availability of in person appointments. The patient expressed understanding and agreed to proceed.    History of Present Illness: Tabitha [name] is a 53 y.o. who identifies as a female who was assigned female at birth, and is being seen today for cough and congestion.  HPI: Cough This is a new problem. The current episode started 1 to 4 weeks ago (1 weeks). The problem has been gradually worsening. The problem occurs every few minutes. The cough is Productive of sputum (intermittent, when productive it is clear). Associated symptoms include chills, ear congestion (left), headaches, myalgias, nasal congestion, postnasal drip, rhinorrhea, a sore throat, shortness of breath, sweats and wheezing. Pertinent negatives include no ear pain or fever (unknown). The symptoms are aggravated by lying down. She has tried a beta-agonist inhaler (Vit C, sudafed, dayquil) for the symptoms. Her past medical history is significant for asthma and pneumonia (once). There is no history of bronchitis (not in last year).   Has had sick contacts at home with husband and son.  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   Asthma 06/15/2016   Class 3 severe obesity due to excess calories with body mass index (BMI) of 40.0 to 44.9 in adult (  HCC) 09/24/2015   DM (diabetes mellitus), type 2 (HCC) 12/03/2014   Hypertension associated with diabetes (HCC) 12/03/2014   Hyperlipidemia 12/03/2014   Bipolar disorder in partial remission 12/03/2014   GERD without esophagitis 12/03/2014   Sleep apnea 05/01/2010    Allergies:  Allergies  Allergen Reactions   Abilify [Aripiprazole] Hives    Medications:  Current Outpatient Medications:    amoxicillin -clavulanate (AUGMENTIN ) 875-125 MG tablet, Take 1 tablet by mouth 2 (two) times daily., Disp: 14 tablet, Rfl: 0   beclomethasone (QVAR ) 40 MCG/ACT inhaler, Inhale 2 puffs into the lungs 2 (two) times daily., Disp: 1 each, Rfl: 0   ipratropium-albuterol  (DUONEB) 0.5-2.5 (3) MG/3ML SOLN, Take 3 mLs by nebulization every 4 (four) hours as needed., Disp: 150 mL, Rfl: 0   promethazine-dextromethorphan (PROMETHAZINE-DM) 6.25-15 MG/5ML syrup, Take 5 mLs by mouth 4 (four) times daily as needed., Disp: 118 mL, Rfl: 0   ACCU-CHEK GUIDE TEST test strip, USE AS INSTRUCTED THREE TIMES DAILY AS NEEDED, Disp: 200 strip, Rfl: 0   albuterol  (VENTOLIN  HFA) 108 (90 Base) MCG/ACT inhaler, Inhale 2 puffs into the lungs every 6 (six) hours as needed for wheezing or shortness of breath., Disp: 25.5 g, Rfl: 3   atorvastatin  (LIPITOR) 40 MG tablet, TAKE 1 TABLET(40 MG) BY MOUTH DAILY, Disp: 90 tablet, Rfl: 3   divalproex  (DEPAKOTE  ER) 500 MG 24 hr tablet, Take 2 tablets (1,000 mg total) by mouth daily., Disp: 180 tablet, Rfl: 0   EPINEPHrine  0.3 mg/0.3 mL IJ SOAJ injection, Inject 0.3 mg into the muscle as needed for anaphylaxis., Disp: 1 each, Rfl: 0   ezetimibe  (ZETIA ) 10 MG tablet, TAKE 1 TABLET(10 MG) BY MOUTH DAILY, Disp: 90 tablet, Rfl: 0   fluticasone  (FLONASE ) 50 MCG/ACT nasal spray, Place 2 sprays into both nostrils daily., Disp: , Rfl:    [START ON 02/24/2024] gabapentin  (NEURONTIN ) 600 MG tablet, Take 1 tablet (600 mg total) by mouth 3 (three) times daily., Disp: 270 tablet, Rfl: 0   glipiZIDE  (GLUCOTROL ) 10 MG tablet, TAKE 1 TABLET BY MOUTH TWICE DAILY, Disp: 180 tablet, Rfl: 1   ibuprofen (ADVIL) 200 MG tablet, Take 200 mg by mouth every 6 (six) hours as needed., Disp: , Rfl:    Insulin  Pen Needle (BD PEN NEEDLE NANO U/F) 32G X 4 MM MISC, 1 each by Does not apply route daily., Disp: 100 each, Rfl: 2   MELATONIN PO, Take 5 mg by mouth at bedtime as  needed., Disp: , Rfl:    metFORMIN  (GLUCOPHAGE ) 500 MG tablet, TAKE 2 TABLETS(1000 MG) BY MOUTH TWICE DAILY WITH A MEAL, Disp: 120 tablet, Rfl: 0   olmesartan  (BENICAR ) 20 MG tablet, Take 1 tablet (20 mg total) by mouth daily., Disp: 90 tablet, Rfl: 1   omeprazole  (PRILOSEC) 10 MG capsule, TAKE 1 CAPSULE(10 MG) BY MOUTH DAILY, Disp: 90 capsule, Rfl: 1   traZODone  (DESYREL ) 50 MG tablet, Take 0.5-1 tablets (25-50 mg total) by mouth at bedtime., Disp: 30 tablet, Rfl: 2   TRESIBA  FLEXTOUCH 100 UNIT/ML FlexTouch Pen, ADMINISTER 40 UNITS UNDER THE SKIN EVERY DAY. NEED OFFICE VISIT FOR ADDITIONAL REFILLS, Disp: 15 mL, Rfl: 1   Vilazodone  HCl (VIIBRYD ) 40 MG TABS, Take 1 tablet (40 mg total) by mouth daily., Disp: 90 tablet, Rfl: 1   ziprasidone  (GEODON ) 60 MG capsule, Take 1 capsule (60 mg total) by mouth 2 (two) times daily with a meal., Disp: 180 capsule, Rfl: 0  Observations/Objective: Patient is well-developed, well-nourished in no acute distress.  Resting  comfortably at home.  Head is normocephalic, atraumatic.  No labored breathing.  Speech is clear and coherent with logical content.  Patient is alert and oriented at baseline.    Assessment and Plan: 1. Bacterial upper respiratory infection (Primary) - amoxicillin -clavulanate (AUGMENTIN ) 875-125 MG tablet; Take 1 tablet by mouth 2 (two) times daily.  Dispense: 14 tablet; Refill: 0 - promethazine-dextromethorphan (PROMETHAZINE-DM) 6.25-15 MG/5ML syrup; Take 5 mLs by mouth 4 (four) times daily as needed.  Dispense: 118 mL; Refill: 0  2. Moderate persistent asthma with exacerbation - ipratropium-albuterol  (DUONEB) 0.5-2.5 (3) MG/3ML SOLN; Take 3 mLs by nebulization every 4 (four) hours as needed.  Dispense: 150 mL; Refill: 0 - beclomethasone (QVAR ) 40 MCG/ACT inhaler; Inhale 2 puffs into the lungs 2 (two) times daily.  Dispense: 1 each; Refill: 0  - Worsening over a week despite OTC medications - Will treat with Augmentin  - Add Promethazine  DM for cough - Duoneb and Flovent  added for asthma - Continue Albuterol  as needed - Can continue Mucinex (PLAIN) during the day - Push fluids.  - Rest.  - Steam and humidifier can help - Seek in person evaluation if worsening or symptoms fail to improve    Follow Up Instructions: I discussed the assessment and treatment plan with the patient. The patient was provided an opportunity to ask questions and all were answered. The patient agreed with the plan and demonstrated an understanding of the instructions.  A copy of instructions were sent to the patient via MyChart unless otherwise noted below.    The patient was advised to call back or seek an in-person evaluation if the symptoms worsen or if the condition fails to improve as anticipated.    Delon CHRISTELLA Dickinson, PA-C

## 2024-02-18 ENCOUNTER — Telehealth: Admitting: Psychiatry

## 2024-02-24 ENCOUNTER — Other Ambulatory Visit: Payer: Self-pay | Admitting: Psychiatry

## 2024-02-24 DIAGNOSIS — F251 Schizoaffective disorder, depressive type: Secondary | ICD-10-CM

## 2024-02-29 LAB — COMPREHENSIVE METABOLIC PANEL WITH GFR
AG Ratio: 1.6 (calc) (ref 1.0–2.5)
ALT: 19 U/L (ref 6–29)
AST: 13 U/L (ref 10–35)
Albumin: 4.1 g/dL (ref 3.6–5.1)
Alkaline phosphatase (APISO): 88 U/L (ref 37–153)
BUN: 13 mg/dL (ref 7–25)
CO2: 21 mmol/L (ref 20–32)
Calcium: 9.6 mg/dL (ref 8.6–10.4)
Chloride: 98 mmol/L (ref 98–110)
Creat: 0.64 mg/dL (ref 0.50–1.03)
Globulin: 2.5 g/dL (ref 1.9–3.7)
Glucose, Bld: 319 mg/dL — ABNORMAL HIGH (ref 65–139)
Potassium: 4.8 mmol/L (ref 3.5–5.3)
Sodium: 135 mmol/L (ref 135–146)
Total Bilirubin: 0.3 mg/dL (ref 0.2–1.2)
Total Protein: 6.6 g/dL (ref 6.1–8.1)
eGFR: 107 mL/min/1.73m2 (ref >=60)

## 2024-02-29 LAB — CBC
HCT: 38.5 % (ref 35.0–45.0)
Hemoglobin: 12.8 g/dL (ref 11.7–15.5)
MCH: 30.1 pg (ref 27.0–33.0)
MCHC: 33.2 g/dL (ref 32.0–36.0)
MCV: 90.6 fL (ref 80.0–100.0)
MPV: 10.4 fL (ref 7.5–12.5)
Platelets: 188 Thousand/uL (ref 140–400)
RBC: 4.25 Million/uL (ref 3.80–5.10)
RDW: 13.7 % (ref 11.0–15.0)
WBC: 5.6 Thousand/uL (ref 3.8–10.8)

## 2024-02-29 LAB — LIPID PANEL
Cholesterol: 290 mg/dL — ABNORMAL HIGH (ref <200)
HDL: 53 mg/dL (ref >=50)
Non-HDL Cholesterol (Calc): 237 mg/dL — ABNORMAL HIGH (ref <130)
Total CHOL/HDL Ratio: 5.5 (calc) — ABNORMAL HIGH (ref <5.0)
Triglycerides: 817 mg/dL — ABNORMAL HIGH (ref <150)

## 2024-02-29 LAB — HEMOGLOBIN A1C
Hgb A1c MFr Bld: 9.3 % — ABNORMAL HIGH (ref <5.7)
Mean Plasma Glucose: 220 mg/dL
eAG (mmol/L): 12.2 mmol/L

## 2024-03-15 ENCOUNTER — Other Ambulatory Visit: Payer: Self-pay | Admitting: Internal Medicine

## 2024-03-15 NOTE — Telephone Encounter (Signed)
 Copied from CRM [id number]. Topic: Clinical - Medication Question >> Mar 15, 2024  8:26 AM Antwanette L wrote: Reason for CRM: Lamar from [facility] is requesting a refill for the patient's TRESIBA  FlexTouch 100 units/mL insulin  pen. The prescription can be sent to [facility] located at [address] [place], [place] [address]. For any questions or confirmation, Lamar can be reached at [phone]

## 2024-03-17 ENCOUNTER — Other Ambulatory Visit: Payer: Self-pay

## 2024-03-17 MED ORDER — TRESIBA FLEXTOUCH 100 UNIT/ML ~~LOC~~ SOPN: PEN_INJECTOR | SUBCUTANEOUS | 1 refills | Status: AC

## 2024-03-17 NOTE — Telephone Encounter (Signed)
 Rx filled 03/17/24- duplicate request Requested Prescriptions  Pending Prescriptions Disp Refills   insulin  degludec (TRESIBA  FLEXTOUCH) 100 UNIT/ML FlexTouch Pen 15 mL 1    Sig: ADMINISTER 40 UNITS UNDER THE SKIN EVERY DAY. NEED OFFICE VISIT FOR ADDITIONAL REFILLS     Endocrinology:  Diabetes - Insulins Failed - 03/17/2024  1:26 PM      Failed - HBA1C is between 0 and 7.9 and within 180 days    HB A1C (BAYER DCA - WAIVED)  Date Value Ref Range Status  10/22/2017 6.9 <7.0 % Final    Comment:                                          Diabetic Adult            <7.0                                       Healthy Adult        4.3 - 5.7                                                           (DCCT/NGSP) American Diabetes Association's Summary of Glycemic Recommendations for Adults with Diabetes: Hemoglobin A1c <7.0%. More stringent glycemic goals (A1c <6.0%) may further reduce complications at the cost of increased risk of hypoglycemia.    Hgb A1c MFr Bld  Date Value Ref Range Status  09/24/2023 9.3 (H) <5.7 % Final    Comment:    For someone without known diabetes, a hemoglobin A1c value of 6.5% or greater indicates that they may have  diabetes and this should be confirmed with a follow-up  test. . For someone with known diabetes, a value <7% indicates  that their diabetes is well controlled and a value  greater than or equal to 7% indicates suboptimal  control. A1c targets should be individualized based on  duration of diabetes, age, comorbid conditions, and  other considerations. . Currently, no consensus exists regarding use of hemoglobin A1c for diagnosis of diabetes for children. [name] - Valid encounter within last 6 months    Recent Outpatient Visits           5 months ago Hypertension associated with diabetes [facility])   [facility] [place], Angeline ORN, NP   5 months ago Encounter for general adult medical examination with abnormal  findings   [facility] [place], Angeline ORN, NP

## 2024-03-20 ENCOUNTER — Other Ambulatory Visit: Payer: Self-pay | Admitting: Internal Medicine

## 2024-03-20 DIAGNOSIS — E1142 Type 2 diabetes mellitus with diabetic polyneuropathy: Secondary | ICD-10-CM

## 2024-03-21 NOTE — Telephone Encounter (Signed)
 Rx 10/13/23 #180 1RF- too soon Requested Prescriptions  Pending Prescriptions Disp Refills   glipiZIDE  (GLUCOTROL ) 10 MG tablet [Pharmacy Med Name: GLIPIZIDE  10MG  TABLETS] 180 tablet 1    Sig: TAKE 1 TABLET BY MOUTH TWICE DAILY     Endocrinology:  Diabetes - Sulfonylureas Failed - 03/21/2024 10:12 AM      Failed - HBA1C is between 0 and 7.9 and within 180 days    HB A1C (BAYER DCA - WAIVED)  Date Value Ref Range Status  10/22/2017 6.9 <7.0 % Final    Comment:                                          Diabetic Adult            <7.0                                       Healthy Adult        4.3 - 5.7                                                           (DCCT/NGSP) American Diabetes Association's Summary of Glycemic Recommendations for Adults with Diabetes: Hemoglobin A1c <7.0%. More stringent glycemic goals (A1c <6.0%) may further reduce complications at the cost of increased risk of hypoglycemia.    Hgb A1c MFr Bld  Date Value Ref Range Status  09/24/2023 9.3 (H) <5.7 % Final    Comment:    For someone without known diabetes, a hemoglobin A1c value of 6.5% or greater indicates that they may have  diabetes and this should be confirmed with a follow-up  test. . For someone with known diabetes, a value <7% indicates  that their diabetes is well controlled and a value  greater than or equal to 7% indicates suboptimal  control. A1c targets should be individualized based on  duration of diabetes, age, comorbid conditions, and  other considerations. . Currently, no consensus exists regarding use of hemoglobin A1c for diagnosis of diabetes for children. .          Passed - Cr in normal range and within 360 days    Creat  Date Value Ref Range Status  09/24/2023 0.64 0.50 - 1.03 mg/dL Final   Creatinine, Urine  Date Value Ref Range Status  01/07/2023 14 (L) 20 - 275 mg/dL Final         Passed - Valid encounter within last 6 months    Recent Outpatient Visits            5 months ago Hypertension associated with diabetes [facility])   [place] [place], Angeline ORN, NP   5 months ago Encounter for general adult medical examination with abnormal findings   [place] [facility] [place], Angeline ORN, NP

## 2024-03-30 ENCOUNTER — Other Ambulatory Visit: Payer: Self-pay | Admitting: Internal Medicine

## 2024-03-30 ENCOUNTER — Other Ambulatory Visit: Payer: Self-pay | Admitting: Psychiatry

## 2024-04-01 NOTE — Telephone Encounter (Signed)
 Requested medications are due for refill today.  yes  Requested medications are on the active medications list.  yes  Last refill. 09/24/2023 #90 1 rf  Future visit scheduled.   yes  Notes to clinic.  Labs are expired.    Requested Prescriptions  Pending Prescriptions Disp Refills   olmesartan  (BENICAR ) 20 MG tablet [Pharmacy Med Name: OLMESARTAN  MEDOXOMIL 20MG  TABLETS] 90 tablet 1    Sig: TAKE 1 TABLET(20 MG) BY MOUTH DAILY     Cardiovascular:  Angiotensin Receptor Blockers Failed - 04/01/2024  6:51 PM      Failed - Cr in normal range and within 180 days    Creat  Date Value Ref Range Status  09/24/2023 0.64 0.50 - 1.03 mg/dL Final   Creatinine, Urine  Date Value Ref Range Status  01/07/2023 14 (L) 20 - 275 mg/dL Final         Failed - K in normal range and within 180 days    Potassium  Date Value Ref Range Status  09/24/2023 4.8 3.5 - 5.3 mmol/L Final         Passed - Patient is not pregnant      Passed - Last BP in normal range    BP Readings from Last 1 Encounters:  10/08/23 138/78         Passed - Valid encounter within last 6 months    Recent Outpatient Visits           5 months ago Hypertension associated with diabetes [facility])   [facility] [place], Angeline ORN, NP   6 months ago Encounter for general adult medical examination with abnormal findings   [place] [facility] [place], Angeline ORN, NP

## 2024-04-01 NOTE — Progress Notes (Unsigned)
 Virtual Visit via Video Note  I connected with Tabitha [name] on 04/05/24 at  2:00 PM EST by a video enabled telemedicine application and verified that I am speaking with the correct person using two identifiers.  Location: Patient: home Provider: home office Persons participated in the visit- patient, provider    I discussed the limitations of evaluation and management by telemedicine and the availability of in person appointments. The patient expressed understanding and agreed to proceed.    I discussed the assessment and treatment plan with the patient. The patient was provided an opportunity to ask questions and all were answered. The patient agreed with the plan and demonstrated an understanding of the instructions.   The patient was advised to call back or seek an in-person evaluation if the symptoms worsen or if the condition fails to improve as anticipated.   Katheren Sleet, MD    [facility] MD/PA/NP OP Progress Note  04/05/2024 2:37 PM Tabitha [name]  MRN:  [id number]  Chief Complaint:  Chief Complaint  Patient presents with   Follow-up   HPI:  This is a follow-up appointment for schizoaffective disorder, OCD, insomnia.  She states that it has been hard, and she is trying to make it.  Her kitten died in 02-Apr-2024. She was behind her, and the recliner broke her neck when she used it.  She feels guilty.  Her son could not handle this and he was admitted to the hospital.  He was also stressed at school, and he quit job.  He is now seeing a therapist and she will bring him to the appointment.  She is trying to help him that she is at a loss what to do.  Validated her concern, and provided psychoeducation about self compassion while being there for him.  Her sister-in-law was admitted due to necrotizing fasciitis and is in rehab.  She has initial and middle insomnia with nocturia.  She could not continue trazodone  due to headache.  She states that her other son is home from college, and  it has been helpful.  They go to church whenever they can.  She denies SI.  She continues to have AH of her family calling.  She denies VH, HI.  She has been taking clonazepam  from the old prescription for anxiety.  She agrees with the plans as outlined below.   Daily routine: house chores, goes to church weekly.  Exercise: Employment: unemployed, on disability due to paranoia (could not keep her job due to paranoia that people coming to her workplace). used to work at [employer] until 2007 (quit due to pregnancy) Support: husband, 89, son, parents Household: husband (unemployed, had gas gangrene in his right side extremities s/p bilateral amputation, adl independent),  son Marital status: married Number of children: 2 sons (23, another son going to college) Education: college, majored in psychologist, educational, music  Visit Diagnosis:    ICD-10-CM   1. Schizoaffective disorder, depressive type (HCC)  F25.1     2. Obsessive-compulsive disorder, unspecified type  F42.9     3. Anxiety state  F41.1     4. Insomnia, unspecified type  G47.00     5. High risk medication use  Z79.899       Past Psychiatric History: Please see initial evaluation for full details. I have reviewed the history. No updates at this time.     Past Medical History:  Past Medical History:  Diagnosis Date   Allergic rhinitis    Anxiety    Arthritis  2019   Asthma    Bipolar disorder (HCC)    Depression    Diabetes mellitus without complication (HCC)    GERD (gastroesophageal reflux disease)    Hyperlipidemia    Hypertension    Mild sleep apnea    OCD (obsessive compulsive disorder)    Palpitations    Paranoia (HCC)    Schizoaffective disorder (HCC)    Sleep apnea     Past Surgical History:  Procedure Laterality Date   CESAREAN SECTION  2006/2008   CHOLECYSTECTOMY  04/27/2004   TUBAL LIGATION  2008    Family Psychiatric History: Please see initial evaluation for full details. I have reviewed the history. No updates at  this time.    Family History:  Family History  Problem Relation Age of Onset   Lupus Mother    Diabetes Mother        pre diabetes   Diabetes Father    Hyperlipidemia Father    Diabetes Brother    Breast cancer Maternal Grandmother    Depression Paternal Grandmother    Bipolar disorder Paternal Grandmother    Cancer - Colon Neg Hx     Social History:  Social History   Socioeconomic History   Marital status: Married    Spouse name: Raynee Mccasland   Number of children: 2   Years of education: 16   Highest education level: Bachelor's degree (e.g., BA, AB, BS)  Occupational History   Occupation: disability  Tobacco Use   Smoking status: Never   Smokeless tobacco: Never  Vaping Use   Vaping status: Never Used  Substance and Sexual Activity   Alcohol use: Not Currently   Drug use: Never   Sexual activity: Not Currently    Partners: Male    Birth control/protection: Surgical  Other Topics Concern   Not on file  Social History Narrative   Not on file   Social Drivers of Health   Financial Resource Strain: Medium Risk (01/14/2024)   [facility] from [facility]   Overall Financial Resource Strain (CARDIA)    Difficulty of Paying Living Expenses: Somewhat hard  Food Insecurity: Food Insecurity Present (01/14/2024)   [facility]   Hunger Vital Sign    Within the past 12 months, you worried that your food would run out before you got the money to buy more.: Sometimes true    Within the past 12 months, the food you bought just didn't last and you didn't have money to get more.: Sometimes true  Transportation Needs: No Transportation Needs (01/14/2024)   Received from [facility] Transportation    In the past 12 months, has lack of transportation kept you from medical appointments or from getting medications?: No    Lack of Transportation (Non-Medical): No  Physical Activity: Inactive (10/07/2023)    Exercise Vital Sign    Days of Exercise per Week: 0 days    Minutes of Exercise per Session: Not on file  Stress: Stress Concern Present (10/07/2023)   [employer] of Occupational Health - Occupational Stress Questionnaire    Feeling of Stress: Rather much  Social Connections: Socially Integrated (10/07/2023)   Social Connection and Isolation Panel    Frequency of Communication with Friends and Family: Twice a week    Frequency of Social Gatherings with Friends and Family: Once a week    Attends Religious Services: More than 4 times per year    Active Member of [employer]  or Organizations: Yes    Attends Banker Meetings: More than 4 times per year    Marital Status: Married    Allergies:  Allergies  Allergen Reactions   Abilify [Aripiprazole] Hives    Metabolic Disorder Labs: Lab Results  Component Value Date   HGBA1C 9.3 (H) 09/24/2023   MPG 220 09/24/2023   MPG 206 06/23/2022   Lab Results  Component Value Date   PROLACTIN 16.3 08/30/2023   PROLACTIN 37.8 (H) 01/22/2022   Lab Results  Component Value Date   CHOL 290 (H) 09/24/2023   TRIG 817 (H) 09/24/2023   HDL 53 09/24/2023   CHOLHDL 5.5 (H) 09/24/2023   VLDL 51 (H) 04/23/2017   LDLCALC  09/24/2023     Comment:     . LDL cholesterol not calculated. Triglyceride levels greater than 400 mg/dL invalidate calculated LDL results. . Reference range: <100 . Desirable range <100 mg/dL for primary prevention;   <70 mg/dL for patients with CHD or diabetic patients  with > or = 2 CHD risk factors. SABRA LDL-C is now calculated using the Martin-Hopkins  calculation, which is a validated novel method providing  better accuracy than the Friedewald equation in the  estimation of LDL-C.  Gladis APPLETHWAITE et al. SANDREA. 7986;689(80): 2061-2068  (http://education.QuestDiagnostics.com/faq/FAQ164)    LDLCALC 52 01/07/2023   Lab Results  Component Value Date   TSH 1.026 01/22/2022   TSH 1.56 12/17/2021     Therapeutic Level Labs: No results found for: LITHIUM Lab Results  Component Value Date   VALPROATE 81 08/30/2023   VALPROATE 34 (L) 07/21/2023   No results found for: CBMZ  Current Medications: Current Outpatient Medications  Medication Sig Dispense Refill   ACCU-CHEK GUIDE TEST test strip USE AS INSTRUCTED THREE TIMES DAILY AS NEEDED 200 strip 0   albuterol  (VENTOLIN  HFA) 108 (90 Base) MCG/ACT inhaler Inhale 2 puffs into the lungs every 6 (six) hours as needed for wheezing or shortness of breath. 25.5 g 3   amoxicillin -clavulanate (AUGMENTIN ) 875-125 MG tablet Take 1 tablet by mouth 2 (two) times daily. 14 tablet 0   atorvastatin  (LIPITOR) 40 MG tablet TAKE 1 TABLET BY MOUTH EVERY DAY 90 tablet 3   beclomethasone (QVAR ) 40 MCG/ACT inhaler Inhale 2 puffs into the lungs 2 (two) times daily. 1 each 0   [START ON 04/17/2024] divalproex  (DEPAKOTE  ER) 500 MG 24 hr tablet Take 2 tablets (1,000 mg total) by mouth daily. 180 tablet 0   EPINEPHrine  0.3 mg/0.3 mL IJ SOAJ injection Inject 0.3 mg into the muscle as needed for anaphylaxis. 1 each 0   ezetimibe  (ZETIA ) 10 MG tablet TAKE 1 TABLET(10 MG) BY MOUTH DAILY 90 tablet 0   fluticasone  (FLONASE ) 50 MCG/ACT nasal spray Place 2 sprays into both nostrils daily.     gabapentin  (NEURONTIN ) 600 MG tablet Take 1 tablet (600 mg total) by mouth 3 (three) times daily. 270 tablet 0   glipiZIDE  (GLUCOTROL ) 10 MG tablet TAKE 1 TABLET BY MOUTH TWICE DAILY 180 tablet 1   ibuprofen (ADVIL) 200 MG tablet Take 200 mg by mouth every 6 (six) hours as needed.     insulin  degludec (TRESIBA  FLEXTOUCH) 100 UNIT/ML FlexTouch Pen ADMINISTER 40 UNITS UNDER THE SKIN EVERY DAY. NEED OFFICE VISIT FOR ADDITIONAL REFILLS 15 mL 1   Insulin  Pen Needle (BD PEN NEEDLE NANO U/F) 32G X 4 MM MISC 1 each by Does not apply route daily. 100 each 2   ipratropium-albuterol  (DUONEB) 0.5-2.5 (3) MG/3ML SOLN Take  3 mLs by nebulization every 4 (four) hours as needed. 150 mL 0    MELATONIN PO Take 5 mg by mouth at bedtime as needed.     metFORMIN  (GLUCOPHAGE ) 500 MG tablet TAKE 2 TABLETS(1000 MG) BY MOUTH TWICE DAILY WITH A MEAL 120 tablet 0   olmesartan  (BENICAR ) 20 MG tablet Take 1 tablet (20 mg total) by mouth daily. 90 tablet 1   omeprazole  (PRILOSEC) 10 MG capsule TAKE 1 CAPSULE(10 MG) BY MOUTH DAILY 90 capsule 1   promethazine -dextromethorphan (PROMETHAZINE -DM) 6.25-15 MG/5ML syrup Take 5 mLs by mouth 4 (four) times daily as needed. 118 mL 0   traZODone  (DESYREL ) 50 MG tablet Take 0.5-1 tablets (25-50 mg total) by mouth at bedtime. (Patient not taking: Reported on 04/05/2024) 30 tablet 2   Vilazodone  HCl (VIIBRYD ) 40 MG TABS Take 1 tablet (40 mg total) by mouth daily. 90 tablet 1   [START ON 04/11/2024] ziprasidone  (GEODON ) 60 MG capsule Take 1 capsule (60 mg total) by mouth 2 (two) times daily with a meal. 180 capsule 0   No current facility-administered medications for this visit.     Musculoskeletal: Strength & Muscle Tone: N/A Gait & Station: N/A Patient leans: N/A  Psychiatric Specialty Exam: Review of Systems  Psychiatric/Behavioral:  Positive for dysphoric mood and sleep disturbance. Negative for agitation, behavioral problems, confusion, decreased concentration, hallucinations, self-injury and suicidal ideas. The patient is nervous/anxious. The patient is not hyperactive.   All other systems reviewed and are negative.   Last menstrual period 08/03/2015.There is no height or weight on file to calculate BMI.  General Appearance: Well Groomed  Eye Contact:  Good  Speech:  Clear and Coherent  Volume:  Normal  Mood:  not good  Affect:  Appropriate, Congruent, and down  Thought Process:  Coherent  Orientation:  Full (Time, Place, and Person)  Thought Content: Logical   Suicidal Thoughts:  No  Homicidal Thoughts:  No  Memory:  Immediate;   Good  Judgement:  Good  Insight:  Good  Psychomotor Activity:  Normal  Concentration:  Concentration: Good  and Attention Span: Good  Recall:  Good  Fund of Knowledge: Good  Language: Good  Akathisia:  No  Handed:  Right  AIMS (if indicated): not done  Assets:  Communication Skills Desire for Improvement  ADL's:  Intact  Cognition: WNL  Sleep:  Poor   Screenings: AIMS    Flowsheet Row Office Visit from 05/12/2017 in [facility] Office Visit from 07/29/2016 in [facility] Office Visit from 05/06/2016 in [facility] Office Visit from 04/01/2016 in [facility] Office Visit from 03/28/2015 in [facility]  AIMS Total Score 0 0 0 0 0   GAD-7    Flowsheet Row Office Visit from 10/08/2023 in [facility] Office Visit from 09/24/2023 in [facility] Office Visit from 12/30/2022 in [facility] Office Visit from 06/23/2022 in [facility] Office Visit from 06/16/2022 in [facility]  Total GAD-7 Score 16 19 14 5 6    Mini-Mental    Flowsheet Row Office Visit from 09/07/2019 in [facility]  Total Score (max 30 points ) 16   PHQ2-9    Flowsheet Row Office Visit from 10/08/2023 in [facility] Most recent reading at  10/08/2023 11:17 AM Office Visit from 09/24/2023 in [facility] Most recent reading at 09/24/2023  1:27 PM Clinical Support from 09/24/2023 in [facility] Most recent reading at 09/24/2023 11:00 AM Office Visit from 12/30/2022 in [facility] Most recent reading at 12/30/2022 10:33 AM Clinical Support from 09/18/2022 in [facility] Most recent reading at 09/18/2022 11:07 AM  PHQ-2 Total Score 4 6 3 2 6    PHQ-9 Total Score 16 23 6 13 9    Flowsheet Row Office Visit from 06/16/2022 in [facility] Office Visit from 05/07/2022 in [facility] Office Visit from 01/22/2022 in [facility]  C-SSRS RISK CATEGORY No Risk No Risk No Risk     Assessment and Plan:  Tabitha [name] is a 53 y.o. female with a history of schizoaffective disorder,  sleep apnea (using CPAP machine), diabetes, hypertension, hyperlipidemia, GERD, who presents for follow up appointment for below.   1. Schizoaffective disorder, depressive type (HCC) 2. Obsessive-compulsive disorder, unspecified type 3. Anxiety state Other stressors include: her husband being registered as a sex offender    History: Tx from Dr. Calhoun. no psych admission. Originally on Invega  234 mg IM. Perphenazine  6 mg daily, viibryd  40 mg daily, amantadine  100 mg BID, gabapentin  600 mg TID. Unable to continue injection since May due to financial strain   She reports worsening in depressive symptoms in the context of loss of her cat, who died by accident related to the recliner.  She also reports concern about her son, who was recently admitted due to mental health related to this grieving.  She expressed understanding to maintain on the current medication regimen while working on behavioral activation as her mood symptoms could be situational.  She is now open to try psychotherapy.  Will make referral onsite.  Will continue current dose of ziprasidone  to target schizoaffective disorder, along with Depakote  for mood dysregulation.  Will continue Viibryd  for depression and gabapentin  for anxiety.   4. Insomnia, unspecified type She had adverse reaction of headache from trazodone .  She reports some benefit from melatonin; she will continue this.   5. High risk medication use She was advised to obtain labs given she is on Depakote .  It is noted  that she has been taking clonazepam  from a previous prescription; she expressed understanding that this medication will not be restarted to reduce the long term side effect, and to reduce the risk of oversedation with concomitant use of gabapentin .        Last checked  EKG HR 74 , QTc (corrected QtcH 445 msec 08/2023  Lipid panels Chol 290 H, TG 817 H 08/2023  HbA1c 9.1 On metformin  12/2023    VPA 81, LFT wnl, Plt wnl 08/2023, Prolactin 16.3 on 08/2023 - improved Na 08/2023 - improved   Plan Continue Geodon  60 mg twice a day (uptitrated from 60 mg in am, 40 mg in pm, 09/2023) - monitor akathisia Continue Depakote  1000 mg daily  Continue Vilazodone  40 mg daily Continue gabapentin  600 mg 3 times a day Hold trazodone  Obtain labs at [facility] (VPA, LFT, CBC) Next appointment- 1/28 at 11 am, video (She takes clonazepam  0.5 mg from the previous order- ten tabs left. She agrees that this will not be restarted) - She [facility] - on metformin , melatonin 10 mg   Past  trials of medication: Abilify (hives), olanzapine  (hives from 7.5 mg), quetiapine, risperidone (stopped working), trazodone  (headache)   The patient demonstrates the following risk factors for suicide: Chronic risk factors for suicide include: psychiatric disorder of schizoaffective disorder. Acute risk factors for suicide include: unemployment. Protective factors for this patient include: positive social support, responsibility to others (children, family), hope for the future and religious beliefs against suicide. Considering these factors, the overall suicide risk at this point appears to be low. Patient is appropriate for outpatient follow up.  Although she has guns at home, she does not have access to them.    Collaboration of Care: Collaboration of Care: Other reviewed notes in Epic  Patient/Guardian was advised Release of Information must be obtained prior to any record release in order to collaborate  their care with an outside provider. Patient/Guardian was advised if they have not already done so to contact the registration department to sign all necessary forms in order for us  to release information regarding their care.   Consent: Patient/Guardian gives verbal consent for treatment and assignment of benefits for services provided during this visit. Patient/Guardian expressed understanding and agreed to proceed.    Katheren Sleet, MD 04/05/2024, 2:37 PM

## 2024-04-03 ENCOUNTER — Other Ambulatory Visit: Payer: Self-pay | Admitting: Internal Medicine

## 2024-04-03 DIAGNOSIS — K219 Gastro-esophageal reflux disease without esophagitis: Secondary | ICD-10-CM

## 2024-04-03 MED ORDER — OLMESARTAN MEDOXOMIL 20 MG PO TABS: 20.0000 mg | ORAL_TABLET | Freq: Every day | ORAL | 1 refills | Status: AC

## 2024-04-05 ENCOUNTER — Telehealth (INDEPENDENT_AMBULATORY_CARE_PROVIDER_SITE_OTHER): Admitting: Psychiatry

## 2024-04-05 ENCOUNTER — Encounter: Payer: Self-pay | Admitting: Psychiatry

## 2024-04-05 DIAGNOSIS — F411 Generalized anxiety disorder: Secondary | ICD-10-CM | POA: Diagnosis not present

## 2024-04-05 DIAGNOSIS — Z79899 Other long term (current) drug therapy: Secondary | ICD-10-CM | POA: Diagnosis not present

## 2024-04-05 DIAGNOSIS — F251 Schizoaffective disorder, depressive type: Secondary | ICD-10-CM

## 2024-04-05 DIAGNOSIS — G4700 Insomnia, unspecified: Secondary | ICD-10-CM

## 2024-04-05 DIAGNOSIS — F429 Obsessive-compulsive disorder, unspecified: Secondary | ICD-10-CM

## 2024-04-05 MED ORDER — DIVALPROEX SODIUM ER 500 MG PO TB24: 1000.0000 mg | ORAL_TABLET | Freq: Every day | ORAL | 0 refills | Status: AC

## 2024-04-05 NOTE — Telephone Encounter (Signed)
 Requested Prescriptions  Pending Prescriptions Disp Refills   ezetimibe  (ZETIA ) 10 MG tablet [Pharmacy Med Name: EZETIMIBE  10MG  TABLETS] 90 tablet 0    Sig: TAKE 1 TABLET(10 MG) BY MOUTH DAILY     Cardiovascular:  Antilipid - Sterol Transport Inhibitors Failed - 04/05/2024 11:36 AM      Failed - Lipid Panel in normal range within the last 12 months    Cholesterol, Total  Date Value Ref Range Status  09/07/2019 138 100 - 199 mg/dL Final   Cholesterol  Date Value Ref Range Status  09/24/2023 290 (H) <200 mg/dL Final   Cholesterol Piccolo, Waived  Date Value Ref Range Status  04/23/2017 176 <200 mg/dL Final    Comment:                            Desirable                <200                         Borderline High      200- 239                         High                     >239    LDL Cholesterol (Calc)  Date Value Ref Range Status  09/24/2023  mg/dL (calc) Final    Comment:    . LDL cholesterol not calculated. Triglyceride levels greater than 400 mg/dL invalidate calculated LDL results. . Reference range: <100 . Desirable range <100 mg/dL for primary prevention;   <70 mg/dL for patients with CHD or diabetic patients  with > or = 2 CHD risk factors. SABRA LDL-C is now calculated using the Martin-Hopkins  calculation, which is a validated novel method providing  better accuracy than the Friedewald equation in the  estimation of LDL-C.  Gladis APPLETHWAITE et al. SANDREA. 7986;689(80): 2061-2068  (http://education.[facility].com/faq/FAQ164)    HDL  Date Value Ref Range Status  09/24/2023 53 > OR = 50 mg/dL Final  94/86/7978 55 >60 mg/dL Final   Triglycerides  Date Value Ref Range Status  09/24/2023 817 (H) <150 mg/dL Final    Comment:    . If a non-fasting specimen was collected, consider repeat triglyceride testing on a fasting specimen if clinically indicated.  Veatrice et al. J. of Clin. Lipidol. 2015;9:129-169. . . There is increased risk of pancreatitis when the   triglyceride concentration is very high  (> or = 500 mg/dL, especially if > or = 8999 mg/dL).  Veatrice et al. J. of Clin. Lipidol. 2015;9:129-169. .    Triglycerides Piccolo,Waived  Date Value Ref Range Status  04/23/2017 253 (H) <150 mg/dL Final    Comment:                            Normal                   <150                         Borderline High     150 - 199                         High  200 - 499                         Very High                >499          Passed - AST in normal range and within 360 days    AST  Date Value Ref Range Status  09/24/2023 13 10 - 35 U/L Final   AST (SGOT) Piccolo, Waived  Date Value Ref Range Status  04/23/2017 24 11 - 38 U/L Final         Passed - ALT in normal range and within 360 days    ALT  Date Value Ref Range Status  09/24/2023 19 6 - 29 U/L Final   ALT (SGPT) Piccolo, Waived  Date Value Ref Range Status  04/23/2017 35 10 - 47 U/L Final         Passed - Patient is not pregnant      Passed - Valid encounter within last 12 months    Recent Outpatient Visits           6 months ago Hypertension associated with diabetes [facility]   [place] [facility] [place], Angeline ORN, NP   6 months ago Encounter for general adult medical examination with abnormal findings   [place] [facility] [place], Angeline ORN, NP

## 2024-04-05 NOTE — Patient Instructions (Signed)
 Continue Geodon  60 mg twice a day  Continue Depakote  1000 mg daily  Continue Vilazodone  40 mg daily Continue gabapentin  600 mg 3 times a day Hold trazodone  Obtain labs at [facility] (VPA, LFT, CBC) Next appointment- 1/28 at 11 am

## 2024-05-08 ENCOUNTER — Other Ambulatory Visit: Payer: Self-pay | Admitting: Psychiatry

## 2024-05-08 DIAGNOSIS — F251 Schizoaffective disorder, depressive type: Secondary | ICD-10-CM

## 2024-05-13 ENCOUNTER — Other Ambulatory Visit: Payer: Self-pay | Admitting: Psychiatry

## 2024-05-14 NOTE — Telephone Encounter (Signed)
 Please advise her to obtain labs at [facility] as discussed at her previus visit. Thanks,

## 2024-05-15 NOTE — Telephone Encounter (Signed)
Pt confirmed and verbalized understanding.  

## 2024-05-16 ENCOUNTER — Other Ambulatory Visit
Admission: RE | Admit: 2024-05-16 | Discharge: 2024-05-16 | Disposition: A | Discharge status: Home / Self Care | Attending: Psychiatry | Admitting: Psychiatry

## 2024-05-16 ENCOUNTER — Ambulatory Visit: Payer: Self-pay | Admitting: Psychiatry

## 2024-05-16 DIAGNOSIS — Z79899 Other long term (current) drug therapy: Secondary | ICD-10-CM | POA: Diagnosis present

## 2024-05-16 LAB — HEPATIC FUNCTION PANEL
ALT: 22 U/L (ref 0–44)
AST: 19 U/L (ref 15–41)
Albumin: 4.3 g/dL (ref 3.5–5.0)
Alkaline Phosphatase: 91 U/L (ref 38–126)
Bilirubin, Direct: 0.1 mg/dL (ref 0.0–0.2)
Indirect Bilirubin: 0.2 mg/dL — ABNORMAL LOW (ref 0.3–0.9)
Total Bilirubin: 0.4 mg/dL (ref 0.0–1.2)
Total Protein: 6.7 g/dL (ref 6.5–8.1)

## 2024-05-16 LAB — CBC
HCT: 36.5 % (ref 36.0–46.0)
Hemoglobin: 12.8 g/dL (ref 12.0–15.0)
MCH: 30.8 pg (ref 26.0–34.0)
MCHC: 35.1 g/dL (ref 30.0–36.0)
MCV: 88 fL (ref 80.0–100.0)
Platelets: 160 K/uL (ref 150–400)
RBC: 4.15 MIL/uL (ref 3.87–5.11)
RDW: 13.1 % (ref 11.5–15.5)
WBC: 7.5 K/uL (ref 4.0–10.5)
nRBC: 0 % (ref 0.0–0.2)

## 2024-05-16 LAB — VALPROIC ACID LEVEL: Valproic Acid Lvl: <10 ug/mL — ABNORMAL LOW (ref 50–100)

## 2024-05-16 NOTE — Progress Notes (Signed)
"  Lvm to call office     "

## 2024-05-16 NOTE — Progress Notes (Signed)
 Her valproic acid  level is very low. (Other labs are within acceptable range.) Please ask whether she has been able to take Depakote  1000?mg consistently. If adherence is confirmed, the plan is to increase a dose of 250?mg (to a total of 1250?mg daily) until the next visit.

## 2024-05-17 NOTE — Progress Notes (Signed)
 Lvm to call office back for lab results.

## 2024-05-18 ENCOUNTER — Other Ambulatory Visit: Payer: Self-pay | Admitting: Psychiatry

## 2024-05-18 DIAGNOSIS — Z79899 Other long term (current) drug therapy: Secondary | ICD-10-CM

## 2024-05-18 NOTE — Progress Notes (Signed)
 Please advise her to remain on the current dose of Depakote  1000 mg for now. After taking this dose for five days, please advise to obtain labs at [facility] again to measure her valproic acid  level. Ideally, the blood draw should be done before taking this medication (in the morning).

## 2024-05-18 NOTE — Telephone Encounter (Signed)
 Pt stated she did not take her medication for 3 days (Saturday, Sunday, and Monday) and is asking whether you would still like her to increase the medication.

## 2024-05-21 NOTE — Progress Notes (Signed)
 Virtual Visit via Video Note  I connected with Tabitha [name] on 05/26/24 at 11:00 AM EST by a video enabled telemedicine application and verified that I am speaking with the correct person using two identifiers.  Location: Patient: home Provider: home office Persons participated in the visit- patient, provider    I discussed the limitations of evaluation and management by telemedicine and the availability of in person appointments. The patient expressed understanding and agreed to proceed.    I discussed the assessment and treatment plan with the patient. The patient was provided an opportunity to ask questions and all were answered. The patient agreed with the plan and demonstrated an understanding of the instructions.   The patient was advised to call back or seek an in-person evaluation if the symptoms worsen or if the condition fails to improve as anticipated.    Katheren Sleet, MD    [facility] MD/PA/NP OP Progress Note  05/26/2024 11:35 AM Tabitha [name]  MRN:  [id number]  Chief Complaint:  Chief Complaint  Patient presents with   Follow-up   HPI:  This is a follow-up appointment for schizoaffective disorder, anxiety.  She states that she has been feeling grouchy, irritable and upset lately.  She does not feel the greatest.  She states that she has been out of gabapentin  in the last few days due to financial strain.  She was able to restart Depakote  about a week ago.  Her blood sugar is in the 300, and patient has been communicating with her primary care provider.  She continues to hear people calling.  She felt paranoid when she was at the church where she saw a new couple.  Although it has been disturbing, she states that she enjoys taking care of her new cat.  It has been he also denies to have her son around as he is off from high school.  She states that her depression is not as bad.  She has middle insomnia, which she partly attributes to nocturia.  She denies change in  appetite.  She reports occasional passive SI, although she denies any intent or plan.  She agrees to contact emergency resources if any worsening.  She denies decreased need for sleep or euphoria.  She denies alcohol use or drug use.  She agrees with the plans as outlined.   Employment: unemployed, on disability due to paranoia (could not keep her job due to paranoia that people coming to her workplace). used to work at [employer] until 2007 (quit due to pregnancy) Support: husband, 58, son, parents Household: husband (unemployed, had gas gangrene in his right side extremities s/p bilateral amputation, adl independent),  son Marital status: married Number of children: 2 sons (33, another son going to college) Education: college, majored in psychologist, educational, music  Visit Diagnosis:    ICD-10-CM   1. Schizoaffective disorder, depressive type (HCC)  F25.1     2. Obsessive-compulsive disorder, unspecified type  F42.9 Vilazodone  HCl (VIIBRYD ) 40 MG TABS    3. Anxiety state  F41.1     4. Insomnia, unspecified type  G47.00     5. High risk medication use  Z79.899 Valproic acid  level    CBC    Comprehensive metabolic panel with GFR    6. Encounter for vitamin deficiency screening  Z13.21 Vitamin B12      Past Psychiatric History: Please see initial evaluation for full details. I have reviewed the history. No updates at this time.     Past Medical History:  Past Medical History:  Diagnosis Date   Allergic rhinitis    Anxiety    Arthritis 2019   Asthma    Bipolar disorder (HCC)    Depression    Diabetes mellitus without complication (HCC)    GERD (gastroesophageal reflux disease)    Hyperlipidemia    Hypertension    Mild sleep apnea    OCD (obsessive compulsive disorder)    Palpitations    Paranoia (HCC)    Schizoaffective disorder (HCC)    Sleep apnea     Past Surgical History:  Procedure Laterality Date   CESAREAN SECTION  2006/2008   CHOLECYSTECTOMY  04/27/2004   TUBAL LIGATION  2008     Family Psychiatric History: Please see initial evaluation for full details. I have reviewed the history. No updates at this time.     Family History:  Family History  Problem Relation Age of Onset   Lupus Mother    Diabetes Mother        pre diabetes   Diabetes Father    Hyperlipidemia Father    Diabetes Brother    Breast cancer Maternal Grandmother    Depression Paternal Grandmother    Bipolar disorder Paternal Grandmother    Cancer - Colon Neg Hx     Social History:  Social History   Socioeconomic History   Marital status: Married    Spouse name: Jesselle Laflamme   Number of children: 2   Years of education: 16   Highest education level: Bachelor's degree (e.g., BA, AB, BS)  Occupational History   Occupation: disability  Tobacco Use   Smoking status: Never   Smokeless tobacco: Never  Vaping Use   Vaping status: Never Used  Substance and Sexual Activity   Alcohol use: Not Currently   Drug use: Never   Sexual activity: Not Currently    Partners: Male    Birth control/protection: Surgical  Other Topics Concern   Not on file  Social History Narrative   Not on file   Social Drivers of Health   Tobacco Use: Low Risk (05/26/2024)   Patient History    Smoking Tobacco Use: Never    Smokeless Tobacco Use: Never    Passive Exposure: Not on file  Financial Resource Strain: Medium Risk (01/14/2024)   Received from [facility]   Overall Financial Resource Strain (CARDIA)    Difficulty of Paying Living Expenses: Somewhat hard  Food Insecurity: Food Insecurity Present (01/14/2024)   Received from [facility]   Epic    Within the past 12 months, you worried that your food would run out before you got the money to buy more.: Sometimes true    Within the past 12 months, the food you bought just didn't last and you didn't have money to get more.: Sometimes true  Transportation Needs: No Transportation Needs (01/14/2024)   Received from [facility] - Transportation    In the past 12 months, has lack of transportation kept you from medical appointments or from getting medications?: No    Lack of Transportation (Non-Medical): No  Physical Activity: Inactive (10/07/2023)   Exercise Vital Sign    Days of Exercise per Week: 0 days    Minutes of Exercise per Session: Not on file  Stress: Stress Concern Present (10/07/2023)   [employer]ational [employer] - Occupational Stress Questionnaire    Feeling of Stress: Rather much  Social Connections: Socially Integrated (10/07/2023)   Social Connection  and Isolation Panel    Frequency of Communication with Friends and Family: Twice a week    Frequency of Social Gatherings with Friends and Family: Once a week    Attends Religious Services: More than 4 times per year    Active Member of Clubs or Organizations: Yes    Attends Banker Meetings: More than 4 times per year    Marital Status: Married  Depression (PHQ2-9): High Risk (10/08/2023)   Depression (PHQ2-9)    PHQ-2 Score: 16  Alcohol Screen: Low Risk (09/24/2023)   Alcohol Screen    Last Alcohol Screening Score (AUDIT): 0  Housing: Low Risk  (01/14/2024)   Received from [facility]   Epic    In the last 12 months, was there a time when you were not able to pay the mortgage or rent on time?: No    In the past 12 months, how many times have you moved where you were living?: 0    At any time in the past 12 months, were you homeless or living in a shelter (including now)?: No  Utilities: Not At Risk (01/14/2024)   Received from [facility]   Epic    In the past 12 months has the electric, gas, oil, or water company threatened to shut off services in your home?: No  Health Literacy: Adequate Health Literacy (09/24/2023)   B1300 Health Literacy    Frequency of need for help with medical instructions: Never    Allergies: Allergies[1]  Metabolic  Disorder Labs: Lab Results  Component Value Date   HGBA1C 9.3 (H) 09/24/2023   MPG 220 09/24/2023   MPG 206 06/23/2022   Lab Results  Component Value Date   PROLACTIN 16.3 08/30/2023   PROLACTIN 37.8 (H) 01/22/2022   Lab Results  Component Value Date   CHOL 290 (H) 09/24/2023   TRIG 817 (H) 09/24/2023   HDL 53 09/24/2023   CHOLHDL 5.5 (H) 09/24/2023   VLDL 51 (H) 04/23/2017   LDLCALC  09/24/2023     Comment:     . LDL cholesterol not calculated. Triglyceride levels greater than 400 mg/dL invalidate calculated LDL results. . Reference range: <100 . Desirable range <100 mg/dL for primary prevention;   <70 mg/dL for patients with CHD or diabetic patients  with > or = 2 CHD risk factors. SABRA LDL-C is now calculated using the Martin-Hopkins  calculation, which is a validated novel method providing  better accuracy than the Friedewald equation in the  estimation of LDL-C.  Gladis APPLETHWAITE et al. SANDREA. 7986;689(80): 2061-2068  (http://education.[employer].com/faq/FAQ164)    LDLCALC 52 01/07/2023   Lab Results  Component Value Date   TSH 1.026 01/22/2022   TSH 1.56 12/17/2021    Therapeutic Level Labs: No results found for: LITHIUM Lab Results  Component Value Date   VALPROATE <10 (L) 05/16/2024   VALPROATE 81 08/30/2023   No results found for: CBMZ  Current Medications: Current Outpatient Medications  Medication Sig Dispense Refill   ACCU-CHEK GUIDE TEST test strip USE AS INSTRUCTED THREE TIMES DAILY AS NEEDED 200 strip 0   albuterol  (VENTOLIN  HFA) 108 (90 Base) MCG/ACT inhaler Inhale 2 puffs into the lungs every 6 (six) hours as needed for wheezing or shortness of breath. 25.5 g 3   amoxicillin -clavulanate (AUGMENTIN ) 875-125 MG tablet Take 1 tablet by mouth 2 (two) times daily. 14 tablet 0   atorvastatin  (LIPITOR) 40 MG tablet TAKE 1 TABLET BY MOUTH EVERY DAY 90 tablet  3   beclomethasone (QVAR ) 40 MCG/ACT inhaler Inhale 2 puffs into the lungs 2 (two) times  daily. 1 each 0   divalproex  (DEPAKOTE  ER) 500 MG 24 hr tablet Take 2 tablets (1,000 mg total) by mouth daily. 180 tablet 0   EPINEPHrine  0.3 mg/0.3 mL IJ SOAJ injection Inject 0.3 mg into the muscle as needed for anaphylaxis. 1 each 0   ezetimibe  (ZETIA ) 10 MG tablet TAKE 1 TABLET(10 MG) BY MOUTH DAILY 90 tablet 0   fluticasone  (FLONASE ) 50 MCG/ACT nasal spray Place 2 sprays into both nostrils daily.     gabapentin  (NEURONTIN ) 600 MG tablet Take 1 tablet (600 mg total) by mouth 3 (three) times daily. 270 tablet 0   glipiZIDE  (GLUCOTROL ) 10 MG tablet TAKE 1 TABLET BY MOUTH TWICE DAILY 180 tablet 1   ibuprofen (ADVIL) 200 MG tablet Take 200 mg by mouth every 6 (six) hours as needed.     insulin  degludec (TRESIBA  FLEXTOUCH) 100 UNIT/ML FlexTouch Pen ADMINISTER 40 UNITS UNDER THE SKIN EVERY DAY. NEED OFFICE VISIT FOR ADDITIONAL REFILLS 15 mL 1   Insulin  Pen Needle (BD PEN NEEDLE NANO U/F) 32G X 4 MM MISC 1 each by Does not apply route daily. 100 each 2   ipratropium-albuterol  (DUONEB) 0.5-2.5 (3) MG/3ML SOLN Take 3 mLs by nebulization every 4 (four) hours as needed. 150 mL 0   MELATONIN PO Take 5 mg by mouth at bedtime as needed.     metFORMIN  (GLUCOPHAGE ) 500 MG tablet TAKE 2 TABLETS(1000 MG) BY MOUTH TWICE DAILY WITH A MEAL 120 tablet 0   olmesartan  (BENICAR ) 20 MG tablet Take 1 tablet (20 mg total) by mouth daily. 90 tablet 1   omeprazole  (PRILOSEC) 10 MG capsule TAKE 1 CAPSULE(10 MG) BY MOUTH DAILY 90 capsule 1   promethazine -dextromethorphan (PROMETHAZINE -DM) 6.25-15 MG/5ML syrup Take 5 mLs by mouth 4 (four) times daily as needed. 118 mL 0   Vilazodone  HCl (VIIBRYD ) 40 MG TABS Take 1 tablet (40 mg total) by mouth daily. 90 tablet 1   ziprasidone  (GEODON ) 60 MG capsule Take 1 capsule (60 mg total) by mouth 2 (two) times daily with a meal. 180 capsule 0   No current facility-administered medications for this visit.     Musculoskeletal: Strength & Muscle Tone: N/A Gait & Station: N/A Patient  leans: N/A  Psychiatric Specialty Exam: Review of Systems  Psychiatric/Behavioral:  Positive for dysphoric mood, sleep disturbance and suicidal ideas. Negative for agitation, behavioral problems, confusion, decreased concentration, hallucinations and self-injury. The patient is nervous/anxious. The patient is not hyperactive.   All other systems reviewed and are negative.   Last menstrual period 08/03/2015.There is no height or weight on file to calculate BMI.  General Appearance: Well Groomed  Eye Contact:  Good  Speech:  Clear and Coherent  Volume:  Normal  Mood:  Anxious  Affect:  Appropriate, Congruent, and calm  Thought Process:  Coherent  Orientation:  Full (Time, Place, and Person)  Thought Content: Logical   Suicidal Thoughts:  Yes.  without intent/plan  Homicidal Thoughts:  No  Memory:  Immediate;   Good  Judgement:  Good  Insight:  Good  Psychomotor Activity:  Normal  Concentration:  Concentration: Good and Attention Span: Good  Recall:  Good  Fund of Knowledge: Good  Language: Good  Akathisia:  No  Handed:  Right  AIMS (if indicated): not done  Assets:  Communication Skills Desire for Improvement  ADL's:  Intact  Cognition: WNL  Sleep:  Poor  Screenings: AIMS    Flowsheet Row Office Visit from 05/12/2017 in [facility] Office Visit from 07/29/2016 in [facility] Office Visit from 05/06/2016 in [facility] Office Visit from 04/01/2016 in [facility] Office Visit from 03/28/2015 in [facility]  AIMS Total Score 0 0 0 0 0   GAD-7    Flowsheet Row Office Visit from 10/08/2023 in [facility] Office Visit from 09[facility]24/2023 in [facility] Office Visit from 12/30/2022 in [facility] Office Visit  from 06/23/2022 in [facility] Office Visit from 06/16/2022 in [facility]  Total GAD-7 Score 16 19 14 5 6    Mini-Mental    Flowsheet Row Office Visit from 09/07/2019 in [facility]  Total Score (max 30 points ) 16   PHQ2-9    Flowsheet Row Office Visit from 10/08/2023 in [facility] Most recent reading at 10/08/2023 11:17 AM Office Visit from 09/24/2023 in [facility] Most recent reading at 09/24/2023  1:27 PM Clinical Support from 09/24/2023 in [facility] Most recent reading at 09/24/2023 11:00 AM Office Visit from 12/30/2022 in [facility] Most recent reading at 12/30/2022 10:33 AM Clinical Support from 09/18/2022 in [facility] Most recent reading at 09/18/2022 11:07 AM  PHQ-2 Total Score 4 6 3 2 6   PHQ-9 Total Score 16 23 6 13 9    Flowsheet Row Office Visit from 06/16/2022 in [facility] Office Visit from 05/07/2022 in [facility] Office Visit from 01/22/2022 in [facility]  C-SSRS RISK CATEGORY No Risk No Risk No Risk     Assessment and Plan:  Tabitha [name] is a 54 y.o. female with a history of schizoaffective disorder,  sleep apnea (using CPAP machine), diabetes, hypertension, hyperlipidemia, GERD, who presents for follow up appointment for below.   1. Schizoaffective disorder, depressive type (HCC) 2. Obsessive-compulsive disorder, unspecified type 3. Anxiety state She is under stress secondary to her husband being registered as a sex offender    History: Tx from Dr. Calhoun. no psych admission. Originally on Invega  234 mg IM. Perphenazine  6 mg daily, viibryd  40 mg daily, amantadine  100 mg BID, gabapentin  600 mg TID. Unable to continue  injection since May due to financial strain   She reports slight worsening in anxiety and irritability, which coincided with no adherence to gabapentin  in the last few days due to financial strain.  Although she continues to experience paranoia, AH of hearing her family, she has been able to redirect herself by taking care of her cat.  She also reports good connection with her son, who is mainly at home due to the weather.  Given that she has just restarted Depakote , and will restart gabapentin , will not make any further adjustment at this time while closely monitoring.  Will continue ziprasidone  to target schizoaffective disorder, along with Depakote  for mood dysregulation.  Will continue Viibryd  for depression and gabapentin  for anxiety.  She will greatly benefit from CBT; referral was made to onsite.   4. Insomnia, unspecified type - trazodone  (headache) Although she reports middle insomnia, this is not the best timing to start hypnotics  due to her restarting gabapentin .  She reports some benefit from melatonin; she will continue this.   5. High risk medication use She was nonadherent to Depakote  due to financial strain, she has been able to restart this medication consistently at least in the past week.  Will obtain labs for monitoring.        Last checked  EKG HR 74 , QTc (corrected QtcH 445 msec 08/2023  Lipid panels Chol 290 H, TG 817 H 08/2023  HbA1c 9.1 On metformin  12/2023    VPA 81, LFT wnl, Plt wnl 08/2023, Prolactin 16.3 on 08/2023 - improved Na 08/2023 - improved  6. Encounter for vitamin deficiency screening Will check vitamin B12, as metformin  use is associated with deficiency that may exacerbate depression.   Plan Continue Geodon  60 mg twice a day (uptitrated from 60 mg in am, 40 mg in pm, 09/2023) - monitor akathisia Continue Depakote  1000 mg daily  Continue Vilazodone  40 mg daily Restart gabapentin  600 mg 3 times a day Obtain labs at [facility] (VPA, CBC, CMP, vitamin  B12) Next appointment- 3/13 at 9 am, video (She takes clonazepam  0.5 mg from the previous order- ten tabs left. She agrees that this will not be restarted) - She [facility] - on metformin , melatonin 10 mg  - vitamin B 12 wnl  05/2017   Past trials of medication: Abilify (hives), olanzapine  (hives from 7.5 mg), quetiapine, risperidone (stopped working), trazodone  (headache)   The patient demonstrates the following risk factors for suicide: Chronic risk factors for suicide include: psychiatric disorder of schizoaffective disorder. Acute risk factors for suicide include: unemployment. Protective factors for this patient include: positive social support, responsibility to others (children, family), hope for the future and religious beliefs against suicide. Considering these factors, the overall suicide risk at this point appears to be low. Patient is appropriate for outpatient follow up.  Although she has guns at home, she does not have access to them.    Collaboration of Care: Collaboration of Care: Other reviewed notes in Epic  Patient/Guardian was advised Release of Information must be obtained prior to any record release in order to collaborate their care with an outside provider. Patient/Guardian was advised if they have not already done so to contact the registration department to sign all necessary forms in order for us  to release information regarding their care.   Consent: Patient/Guardian gives verbal consent for treatment and assignment of benefits for services provided during this visit. Patient/Guardian expressed understanding and agreed to proceed.    Katheren Sleet, MD 05/26/2024, 11:35 AM     [1]  Allergies Allergen Reactions   Abilify [Aripiprazole] Hives

## 2024-05-26 ENCOUNTER — Encounter: Payer: Self-pay | Admitting: Psychiatry

## 2024-05-26 ENCOUNTER — Telehealth: Admitting: Psychiatry

## 2024-05-26 DIAGNOSIS — Z79899 Other long term (current) drug therapy: Secondary | ICD-10-CM

## 2024-05-26 DIAGNOSIS — G4700 Insomnia, unspecified: Secondary | ICD-10-CM | POA: Diagnosis not present

## 2024-05-26 DIAGNOSIS — F429 Obsessive-compulsive disorder, unspecified: Secondary | ICD-10-CM

## 2024-05-26 DIAGNOSIS — Z5181 Encounter for therapeutic drug level monitoring: Secondary | ICD-10-CM

## 2024-05-26 DIAGNOSIS — F251 Schizoaffective disorder, depressive type: Secondary | ICD-10-CM | POA: Diagnosis not present

## 2024-05-26 DIAGNOSIS — F411 Generalized anxiety disorder: Secondary | ICD-10-CM | POA: Diagnosis not present

## 2024-05-26 DIAGNOSIS — Z1321 Encounter for screening for nutritional disorder: Secondary | ICD-10-CM

## 2024-05-26 MED ORDER — VILAZODONE HCL 40 MG PO TABS: 40.0000 mg | ORAL_TABLET | Freq: Every day | ORAL | 1 refills | Status: AC

## 2024-05-26 NOTE — Patient Instructions (Signed)
 Continue Geodon  60 mg twice a day  Continue Depakote  1000 mg daily  Continue Vilazodone  40 mg daily Restart gabapentin  600 mg 3 times a day Obtain labs at [facility] (VPA, CBC, CMP, vitamin B12) Next appointment- 3/13 at 9 am

## 2024-07-07 ENCOUNTER — Telehealth: Admitting: Psychiatry

## 2024-07-14 ENCOUNTER — Telehealth: Admitting: Psychiatry

## 2024-09-29 ENCOUNTER — Ambulatory Visit

## 2024-10-04 ENCOUNTER — Ambulatory Visit
